# Patient Record
Sex: Male | Born: 1945 | Race: White | Hispanic: No | Marital: Married | Smoking: Former smoker
Health system: Southern US, Community
[De-identification: ages and names within clinical notes are randomized; demographics above are authoritative.]

## PROBLEM LIST (undated history)

## (undated) ENCOUNTER — Emergency Department (HOSPITAL_COMMUNITY): Admission: EM | Discharge status: Against Medical Advice | Payer: Self-pay

## (undated) DIAGNOSIS — H269 Unspecified cataract: Secondary | ICD-10-CM

## (undated) DIAGNOSIS — I639 Cerebral infarction, unspecified: Secondary | ICD-10-CM

## (undated) DIAGNOSIS — D649 Anemia, unspecified: Secondary | ICD-10-CM

## (undated) DIAGNOSIS — N419 Inflammatory disease of prostate, unspecified: Secondary | ICD-10-CM

## (undated) DIAGNOSIS — E119 Type 2 diabetes mellitus without complications: Secondary | ICD-10-CM

## (undated) DIAGNOSIS — M1990 Unspecified osteoarthritis, unspecified site: Secondary | ICD-10-CM

## (undated) DIAGNOSIS — F419 Anxiety disorder, unspecified: Secondary | ICD-10-CM

## (undated) DIAGNOSIS — I499 Cardiac arrhythmia, unspecified: Secondary | ICD-10-CM

## (undated) DIAGNOSIS — Z87442 Personal history of urinary calculi: Secondary | ICD-10-CM

## (undated) DIAGNOSIS — F32A Depression, unspecified: Secondary | ICD-10-CM

## (undated) DIAGNOSIS — N400 Enlarged prostate without lower urinary tract symptoms: Secondary | ICD-10-CM

## (undated) DIAGNOSIS — K219 Gastro-esophageal reflux disease without esophagitis: Secondary | ICD-10-CM

## (undated) DIAGNOSIS — I509 Heart failure, unspecified: Secondary | ICD-10-CM

## (undated) DIAGNOSIS — J449 Chronic obstructive pulmonary disease, unspecified: Secondary | ICD-10-CM

## (undated) DIAGNOSIS — Z9861 Coronary angioplasty status: Secondary | ICD-10-CM

## (undated) DIAGNOSIS — G4730 Sleep apnea, unspecified: Secondary | ICD-10-CM

## (undated) DIAGNOSIS — I739 Peripheral vascular disease, unspecified: Secondary | ICD-10-CM

## (undated) DIAGNOSIS — I2510 Atherosclerotic heart disease of native coronary artery without angina pectoris: Secondary | ICD-10-CM

## (undated) DIAGNOSIS — G4761 Periodic limb movement disorder: Secondary | ICD-10-CM

## (undated) DIAGNOSIS — I779 Disorder of arteries and arterioles, unspecified: Secondary | ICD-10-CM

## (undated) DIAGNOSIS — I10 Essential (primary) hypertension: Secondary | ICD-10-CM

## (undated) DIAGNOSIS — E039 Hypothyroidism, unspecified: Secondary | ICD-10-CM

## (undated) DIAGNOSIS — I498 Other specified cardiac arrhythmias: Secondary | ICD-10-CM

## (undated) DIAGNOSIS — Z720 Tobacco use: Secondary | ICD-10-CM

## (undated) DIAGNOSIS — R0600 Dyspnea, unspecified: Secondary | ICD-10-CM

## (undated) DIAGNOSIS — E785 Hyperlipidemia, unspecified: Secondary | ICD-10-CM

## (undated) DIAGNOSIS — I69319 Unspecified symptoms and signs involving cognitive functions following cerebral infarction: Secondary | ICD-10-CM

## (undated) DIAGNOSIS — G4733 Obstructive sleep apnea (adult) (pediatric): Secondary | ICD-10-CM

## (undated) DIAGNOSIS — F329 Major depressive disorder, single episode, unspecified: Secondary | ICD-10-CM

## (undated) DIAGNOSIS — Z7902 Long term (current) use of antithrombotics/antiplatelets: Secondary | ICD-10-CM

## (undated) HISTORY — PX: OTHER SURGICAL HISTORY: SHX169

## (undated) HISTORY — PX: TONSILLECTOMY: SUR1361

## (undated) HISTORY — PX: CAROTID PTA/STENT INTERVENTION: CATH118231

## (undated) HISTORY — PX: SHOULDER ARTHROSCOPY: SHX128

## (undated) HISTORY — PX: CORONARY ANGIOPLASTY WITH STENT PLACEMENT: SHX49

---

## 2000-07-31 DIAGNOSIS — I252 Old myocardial infarction: Secondary | ICD-10-CM

## 2000-07-31 HISTORY — PX: CORONARY ANGIOPLASTY WITH STENT PLACEMENT: SHX49

## 2000-07-31 HISTORY — DX: Old myocardial infarction: I25.2

## 2005-07-15 ENCOUNTER — Inpatient Hospital Stay: Payer: Self-pay | Admitting: Orthopedic Surgery

## 2005-07-15 ENCOUNTER — Other Ambulatory Visit: Payer: Self-pay

## 2005-07-16 ENCOUNTER — Other Ambulatory Visit: Payer: Self-pay

## 2005-09-27 ENCOUNTER — Ambulatory Visit: Payer: Self-pay | Admitting: Orthopedic Surgery

## 2005-10-24 ENCOUNTER — Ambulatory Visit: Payer: Self-pay | Admitting: Urology

## 2006-07-19 ENCOUNTER — Ambulatory Visit: Payer: Self-pay | Admitting: Gastroenterology

## 2008-08-21 ENCOUNTER — Ambulatory Visit: Payer: Self-pay | Admitting: Family Medicine

## 2010-12-10 ENCOUNTER — Emergency Department: Payer: Self-pay | Admitting: Emergency Medicine

## 2010-12-16 ENCOUNTER — Emergency Department: Payer: Self-pay | Admitting: Emergency Medicine

## 2011-07-11 ENCOUNTER — Ambulatory Visit: Payer: Self-pay

## 2011-08-01 DIAGNOSIS — I639 Cerebral infarction, unspecified: Secondary | ICD-10-CM

## 2011-08-01 HISTORY — DX: Cerebral infarction, unspecified: I63.9

## 2012-05-11 ENCOUNTER — Encounter (HOSPITAL_COMMUNITY): Payer: Self-pay | Admitting: Anesthesiology

## 2012-05-11 ENCOUNTER — Encounter (HOSPITAL_COMMUNITY): Payer: Self-pay | Admitting: Certified Registered Nurse Anesthetist

## 2012-05-11 ENCOUNTER — Inpatient Hospital Stay (HOSPITAL_COMMUNITY): Payer: Medicare Other | Admitting: Certified Registered Nurse Anesthetist

## 2012-05-11 ENCOUNTER — Encounter (HOSPITAL_COMMUNITY)
Admission: AD | Disposition: A | Discharge status: Skilled Nursing Facility | Payer: Self-pay | Source: Other Acute Inpatient Hospital | Attending: Neurology

## 2012-05-11 ENCOUNTER — Inpatient Hospital Stay (HOSPITAL_COMMUNITY): Payer: Medicare Other

## 2012-05-11 ENCOUNTER — Emergency Department: Payer: Self-pay | Admitting: Emergency Medicine

## 2012-05-11 ENCOUNTER — Inpatient Hospital Stay (HOSPITAL_COMMUNITY)
Admission: AD | Admit: 2012-05-11 | Discharge: 2012-05-17 | DRG: 023 | Disposition: A | Discharge status: Skilled Nursing Facility | Payer: Medicare Other | Source: Other Acute Inpatient Hospital | Attending: Neurology | Admitting: Neurology

## 2012-05-11 DIAGNOSIS — J4489 Other specified chronic obstructive pulmonary disease: Secondary | ICD-10-CM | POA: Diagnosis present

## 2012-05-11 DIAGNOSIS — Z9861 Coronary angioplasty status: Secondary | ICD-10-CM

## 2012-05-11 DIAGNOSIS — J9600 Acute respiratory failure, unspecified whether with hypoxia or hypercapnia: Secondary | ICD-10-CM | POA: Diagnosis present

## 2012-05-11 DIAGNOSIS — R1310 Dysphagia, unspecified: Secondary | ICD-10-CM | POA: Diagnosis present

## 2012-05-11 DIAGNOSIS — Z9282 Status post administration of tPA (rtPA) in a different facility within the last 24 hours prior to admission to current facility: Secondary | ICD-10-CM

## 2012-05-11 DIAGNOSIS — Z880 Allergy status to penicillin: Secondary | ICD-10-CM

## 2012-05-11 DIAGNOSIS — E119 Type 2 diabetes mellitus without complications: Secondary | ICD-10-CM | POA: Diagnosis present

## 2012-05-11 DIAGNOSIS — E039 Hypothyroidism, unspecified: Secondary | ICD-10-CM | POA: Diagnosis present

## 2012-05-11 DIAGNOSIS — I10 Essential (primary) hypertension: Secondary | ICD-10-CM | POA: Diagnosis present

## 2012-05-11 DIAGNOSIS — R4701 Aphasia: Secondary | ICD-10-CM | POA: Diagnosis present

## 2012-05-11 DIAGNOSIS — I2510 Atherosclerotic heart disease of native coronary artery without angina pectoris: Secondary | ICD-10-CM | POA: Diagnosis present

## 2012-05-11 DIAGNOSIS — F3289 Other specified depressive episodes: Secondary | ICD-10-CM | POA: Diagnosis present

## 2012-05-11 DIAGNOSIS — I639 Cerebral infarction, unspecified: Secondary | ICD-10-CM | POA: Diagnosis present

## 2012-05-11 DIAGNOSIS — E785 Hyperlipidemia, unspecified: Secondary | ICD-10-CM | POA: Diagnosis present

## 2012-05-11 DIAGNOSIS — G8190 Hemiplegia, unspecified affecting unspecified side: Secondary | ICD-10-CM | POA: Diagnosis present

## 2012-05-11 DIAGNOSIS — N400 Enlarged prostate without lower urinary tract symptoms: Secondary | ICD-10-CM | POA: Diagnosis present

## 2012-05-11 DIAGNOSIS — I63239 Cerebral infarction due to unspecified occlusion or stenosis of unspecified carotid arteries: Principal | ICD-10-CM | POA: Diagnosis present

## 2012-05-11 DIAGNOSIS — I6529 Occlusion and stenosis of unspecified carotid artery: Secondary | ICD-10-CM | POA: Diagnosis present

## 2012-05-11 DIAGNOSIS — F17200 Nicotine dependence, unspecified, uncomplicated: Secondary | ICD-10-CM | POA: Diagnosis present

## 2012-05-11 DIAGNOSIS — Z23 Encounter for immunization: Secondary | ICD-10-CM

## 2012-05-11 DIAGNOSIS — F329 Major depressive disorder, single episode, unspecified: Secondary | ICD-10-CM | POA: Diagnosis present

## 2012-05-11 DIAGNOSIS — F411 Generalized anxiety disorder: Secondary | ICD-10-CM | POA: Diagnosis present

## 2012-05-11 DIAGNOSIS — J449 Chronic obstructive pulmonary disease, unspecified: Secondary | ICD-10-CM | POA: Diagnosis present

## 2012-05-11 HISTORY — DX: Chronic obstructive pulmonary disease, unspecified: J44.9

## 2012-05-11 HISTORY — DX: Hyperlipidemia, unspecified: E78.5

## 2012-05-11 HISTORY — DX: Depression, unspecified: F32.A

## 2012-05-11 HISTORY — DX: Atherosclerotic heart disease of native coronary artery without angina pectoris: I25.10

## 2012-05-11 HISTORY — DX: Tobacco use: Z72.0

## 2012-05-11 HISTORY — DX: Hypothyroidism, unspecified: E03.9

## 2012-05-11 HISTORY — DX: Essential (primary) hypertension: I10

## 2012-05-11 HISTORY — DX: Major depressive disorder, single episode, unspecified: F32.9

## 2012-05-11 HISTORY — DX: Coronary angioplasty status: Z98.61

## 2012-05-11 HISTORY — DX: Anxiety disorder, unspecified: F41.9

## 2012-05-11 HISTORY — DX: Benign prostatic hyperplasia without lower urinary tract symptoms: N40.0

## 2012-05-11 LAB — COMPREHENSIVE METABOLIC PANEL
Anion Gap: 8 (ref 7–16)
Calcium, Total: 8.5 mg/dL (ref 8.5–10.1)
Co2: 26 mmol/L (ref 21–32)
EGFR (Non-African Amer.): >60
Osmolality: 283 (ref 275–301)
Potassium: 3.9 mmol/L (ref 3.5–5.1)
Sodium: 141 mmol/L (ref 136–145)

## 2012-05-11 LAB — URINALYSIS, COMPLETE
Bacteria: NONE SEEN
Blood: NEGATIVE
Glucose,UR: NEGATIVE mg/dL (ref 0–75)
Leukocyte Esterase: NEGATIVE
Nitrite: NEGATIVE
Ph: 5 (ref 4.5–8.0)
Protein: NEGATIVE
Specific Gravity: 1.004 (ref 1.003–1.030)
WBC UR: <1 /HPF (ref 0–5)

## 2012-05-11 LAB — TROPONIN I: Troponin-I: <0.02 ng/mL

## 2012-05-11 LAB — DRUG SCREEN, URINE
Amphetamines, Ur Screen: NEGATIVE (ref ?–1000)
Cocaine Metabolite,Ur ~~LOC~~: NEGATIVE (ref ?–300)
MDMA (Ecstasy)Ur Screen: NEGATIVE (ref ?–500)
Methadone, Ur Screen: NEGATIVE (ref ?–300)
Opiate, Ur Screen: NEGATIVE (ref ?–300)
Tricyclic, Ur Screen: NEGATIVE (ref ?–1000)

## 2012-05-11 LAB — APTT: Activated PTT: 32.5 secs (ref 23.6–35.9)

## 2012-05-11 LAB — CBC
HCT: 43.5 % (ref 40.0–52.0)
MCH: 34.3 pg — ABNORMAL HIGH (ref 26.0–34.0)
MCV: 99 fL (ref 80–100)

## 2012-05-11 LAB — ETHANOL: Ethanol: 30 mg/dL

## 2012-05-11 LAB — CK TOTAL AND CKMB (NOT AT ARMC): CK, Total: 88 U/L (ref 35–232)

## 2012-05-11 SURGERY — RADIOLOGY WITH ANESTHESIA
Anesthesia: General

## 2012-05-11 MED ORDER — SODIUM CHLORIDE 0.9 % IJ SOLN
1.5000 mg | INTRAVENOUS | Status: AC
  Filled 2012-05-11: qty 0.3

## 2012-05-11 MED ORDER — ALTEPLASE 30 MG/30 ML FOR INTERV. RAD
30.0000 mg | INTRA_ARTERIAL | Status: AC
  Filled 2012-05-11: qty 30

## 2012-05-11 MED ORDER — MIDAZOLAM HCL 2 MG/2ML IJ SOLN
INTRAMUSCULAR | Status: DC | PRN
  Administered 2012-05-11 (×2): 1 mg via INTRAVENOUS

## 2012-05-11 MED ORDER — FENTANYL CITRATE 0.05 MG/ML IJ SOLN
INTRAMUSCULAR | Status: DC | PRN
  Administered 2012-05-11 (×2): 25 ug via INTRAVENOUS

## 2012-05-12 ENCOUNTER — Inpatient Hospital Stay (HOSPITAL_COMMUNITY): Payer: Medicare Other

## 2012-05-12 ENCOUNTER — Encounter (HOSPITAL_COMMUNITY): Payer: Self-pay | Admitting: Certified Registered Nurse Anesthetist

## 2012-05-12 ENCOUNTER — Encounter (HOSPITAL_COMMUNITY): Payer: Self-pay | Admitting: Neurology

## 2012-05-12 DIAGNOSIS — I639 Cerebral infarction, unspecified: Secondary | ICD-10-CM | POA: Diagnosis present

## 2012-05-12 DIAGNOSIS — J449 Chronic obstructive pulmonary disease, unspecified: Secondary | ICD-10-CM

## 2012-05-12 DIAGNOSIS — J95821 Acute postprocedural respiratory failure: Secondary | ICD-10-CM

## 2012-05-12 DIAGNOSIS — I6529 Occlusion and stenosis of unspecified carotid artery: Secondary | ICD-10-CM

## 2012-05-12 DIAGNOSIS — I63239 Cerebral infarction due to unspecified occlusion or stenosis of unspecified carotid arteries: Secondary | ICD-10-CM

## 2012-05-12 DIAGNOSIS — J9600 Acute respiratory failure, unspecified whether with hypoxia or hypercapnia: Secondary | ICD-10-CM | POA: Diagnosis present

## 2012-05-12 DIAGNOSIS — I6330 Cerebral infarction due to thrombosis of unspecified cerebral artery: Secondary | ICD-10-CM

## 2012-05-12 DIAGNOSIS — I10 Essential (primary) hypertension: Secondary | ICD-10-CM | POA: Diagnosis present

## 2012-05-12 LAB — BLOOD GAS, ARTERIAL
Drawn by: 35135
Drawn by: 32470
O2 Saturation: 99.2 %
PEEP: 5 cmH2O
PEEP: 5 cmH2O
Pressure support: 5 cmH2O
RATE: 14 resp/min
pCO2 arterial: 48.4 mmHg — ABNORMAL HIGH (ref 35.0–45.0)
pH, Arterial: 7.342 — ABNORMAL LOW (ref 7.350–7.450)
pO2, Arterial: 144 mmHg — ABNORMAL HIGH (ref 80.0–100.0)

## 2012-05-12 LAB — GLUCOSE, CAPILLARY
Glucose-Capillary: 186 mg/dL — ABNORMAL HIGH (ref 70–99)
Glucose-Capillary: 128 mg/dL — ABNORMAL HIGH (ref 70–99)

## 2012-05-12 LAB — TROPONIN I: Troponin I: <0.3 ng/mL (ref <0.30)

## 2012-05-12 LAB — TSH: TSH: 0.398 u[IU]/mL (ref 0.350–4.500)

## 2012-05-12 MED ORDER — ASPIRIN 300 MG RE SUPP
300.0000 mg | Freq: Every day | RECTAL | Status: DC
  Administered 2012-05-12 – 2012-05-13 (×2): 300 mg via RECTAL
  Filled 2012-05-12 (×3): qty 1

## 2012-05-12 MED ORDER — LIDOCAINE HCL (CARDIAC) 20 MG/ML IV SOLN
INTRAVENOUS | Status: DC | PRN
  Administered 2012-05-11: 100 mg via INTRAVENOUS

## 2012-05-12 MED ORDER — ONDANSETRON HCL 4 MG/2ML IJ SOLN
4.0000 mg | Freq: Four times a day (QID) | INTRAMUSCULAR | Status: DC | PRN
  Administered 2012-05-12: 4 mg via INTRAVENOUS

## 2012-05-12 MED ORDER — HYDROMORPHONE HCL PF 1 MG/ML IJ SOLN: 0.2500 mg | INTRAMUSCULAR | Status: DC | PRN

## 2012-05-12 MED ORDER — ASPIRIN 81 MG PO CHEW: 81.0000 mg | CHEWABLE_TABLET | Freq: Once | ORAL | Status: DC

## 2012-05-12 MED ORDER — PHENYLEPHRINE HCL 10 MG/ML IJ SOLN
10.0000 mg | INTRAVENOUS | Status: DC | PRN
  Administered 2012-05-12: 100 ug/min via INTRAVENOUS

## 2012-05-12 MED ORDER — OXYCODONE HCL 5 MG PO TABS: 5.0000 mg | ORAL_TABLET | Freq: Once | ORAL | Status: AC | PRN

## 2012-05-12 MED ORDER — METOPROLOL TARTRATE 1 MG/ML IV SOLN
INTRAVENOUS | Status: AC
  Administered 2012-05-12: 5 mg
  Filled 2012-05-12: qty 5

## 2012-05-12 MED ORDER — SODIUM CHLORIDE 0.9 % IV SOLN
INTRAVENOUS | Status: DC
  Administered 2012-05-12: 06:00:00 via INTRAVENOUS
  Administered 2012-05-13: 75 mL/h via INTRAVENOUS
  Administered 2012-05-13 – 2012-05-15 (×2): via INTRAVENOUS

## 2012-05-12 MED ORDER — ACETAMINOPHEN 650 MG RE SUPP: 650.0000 mg | RECTAL | Status: DC | PRN

## 2012-05-12 MED ORDER — ROCURONIUM BROMIDE 100 MG/10ML IV SOLN
INTRAVENOUS | Status: DC | PRN
  Administered 2012-05-12 (×2): 30 mg via INTRAVENOUS
  Administered 2012-05-12 (×2): 20 mg via INTRAVENOUS
  Administered 2012-05-12: 50 mg via INTRAVENOUS
  Administered 2012-05-12: 20 mg via INTRAVENOUS

## 2012-05-12 MED ORDER — SUCCINYLCHOLINE CHLORIDE 20 MG/ML IJ SOLN
INTRAMUSCULAR | Status: DC | PRN
  Administered 2012-05-11: 120 mg via INTRAVENOUS

## 2012-05-12 MED ORDER — CLOPIDOGREL BISULFATE 75 MG PO TABS
75.0000 mg | ORAL_TABLET | Freq: Once | ORAL | Status: DC
  Filled 2012-05-12: qty 1

## 2012-05-12 MED ORDER — IPRATROPIUM-ALBUTEROL 18-103 MCG/ACT IN AERO
4.0000 | INHALATION_SPRAY | RESPIRATORY_TRACT | Status: DC
  Administered 2012-05-12 (×4): 4 via RESPIRATORY_TRACT
  Filled 2012-05-12: qty 14.7

## 2012-05-12 MED ORDER — ACETAMINOPHEN 500 MG PO TABS: 1000.0000 mg | ORAL_TABLET | Freq: Four times a day (QID) | ORAL | Status: DC | PRN

## 2012-05-12 MED ORDER — ARTIFICIAL TEARS OP OINT
TOPICAL_OINTMENT | OPHTHALMIC | Status: DC | PRN
  Administered 2012-05-11: 1 via OPHTHALMIC

## 2012-05-12 MED ORDER — ALBUTEROL SULFATE HFA 108 (90 BASE) MCG/ACT IN AERS
INHALATION_SPRAY | RESPIRATORY_TRACT | Status: DC | PRN
  Administered 2012-05-11 – 2012-05-12 (×3): 2 via RESPIRATORY_TRACT

## 2012-05-12 MED ORDER — NICARDIPINE HCL IN NACL 20-0.86 MG/200ML-% IV SOLN
5.0000 mg/h | INTRAVENOUS | Status: AC
  Filled 2012-05-12: qty 200

## 2012-05-12 MED ORDER — IPRATROPIUM-ALBUTEROL 18-103 MCG/ACT IN AERO
6.0000 | INHALATION_SPRAY | RESPIRATORY_TRACT | Status: DC
  Filled 2012-05-12: qty 14.7

## 2012-05-12 MED ORDER — MEPERIDINE HCL 25 MG/ML IJ SOLN: 6.2500 mg | INTRAMUSCULAR | Status: DC | PRN

## 2012-05-12 MED ORDER — LEVOTHYROXINE SODIUM 150 MCG PO TABS
150.0000 ug | ORAL_TABLET | Freq: Every day | ORAL | Status: DC
  Administered 2012-05-12 – 2012-05-14 (×2): 150 ug via ORAL
  Filled 2012-05-12 (×5): qty 1

## 2012-05-12 MED ORDER — PROPOFOL INFUSION 10 MG/ML OPTIME
INTRAVENOUS | Status: DC | PRN
  Administered 2012-05-12: 75 ug/kg/min via INTRAVENOUS

## 2012-05-12 MED ORDER — BIOTENE DRY MOUTH MT LIQD
15.0000 mL | Freq: Four times a day (QID) | OROMUCOSAL | Status: DC
  Administered 2012-05-12 – 2012-05-17 (×20): 15 mL via OROMUCOSAL

## 2012-05-12 MED ORDER — ALBUTEROL SULFATE (5 MG/ML) 0.5% IN NEBU
2.5000 mg | INHALATION_SOLUTION | RESPIRATORY_TRACT | Status: DC
  Administered 2012-05-13 – 2012-05-16 (×13): 2.5 mg via RESPIRATORY_TRACT
  Filled 2012-05-12 (×14): qty 0.5

## 2012-05-12 MED ORDER — FENTANYL CITRATE 0.05 MG/ML IJ SOLN
INTRAMUSCULAR | Status: DC | PRN
  Administered 2012-05-11: 150 ug via INTRAVENOUS
  Administered 2012-05-11 – 2012-05-12 (×2): 100 ug via INTRAVENOUS
  Administered 2012-05-12: 50 ug via INTRAVENOUS
  Administered 2012-05-12: 100 ug via INTRAVENOUS
  Administered 2012-05-12 (×2): 50 ug via INTRAVENOUS

## 2012-05-12 MED ORDER — ALBUTEROL SULFATE HFA 108 (90 BASE) MCG/ACT IN AERS
4.0000 | INHALATION_SPRAY | RESPIRATORY_TRACT | Status: DC | PRN
  Filled 2012-05-12: qty 6.7

## 2012-05-12 MED ORDER — LIDOCAINE HCL 4 % MT SOLN
OROMUCOSAL | Status: DC | PRN
  Administered 2012-05-11: 4 mL via TOPICAL

## 2012-05-12 MED ORDER — PANTOPRAZOLE SODIUM 40 MG IV SOLR
40.0000 mg | Freq: Every day | INTRAVENOUS | Status: DC
  Filled 2012-05-12: qty 40

## 2012-05-12 MED ORDER — ONDANSETRON HCL 4 MG/2ML IJ SOLN: 4.0000 mg | Freq: Once | INTRAMUSCULAR | Status: AC | PRN

## 2012-05-12 MED ORDER — PROPOFOL 10 MG/ML IV EMUL
5.0000 ug/kg/min | INTRAVENOUS | Status: DC
  Administered 2012-05-12: 5 ug/kg/min via INTRAVENOUS
  Filled 2012-05-12: qty 100

## 2012-05-12 MED ORDER — IPRATROPIUM-ALBUTEROL 18-103 MCG/ACT IN AERO: 6.0000 | INHALATION_SPRAY | RESPIRATORY_TRACT | Status: DC

## 2012-05-12 MED ORDER — PANTOPRAZOLE SODIUM 40 MG IV SOLR
40.0000 mg | Freq: Every day | INTRAVENOUS | Status: DC
  Administered 2012-05-12 – 2012-05-14 (×3): 40 mg via INTRAVENOUS
  Filled 2012-05-12 (×7): qty 40

## 2012-05-12 MED ORDER — SODIUM CHLORIDE 0.9 % IV SOLN: 10.0000 mg | INTRAVENOUS | Status: DC | PRN

## 2012-05-12 MED ORDER — ACETAMINOPHEN 650 MG RE SUPP: 650.0000 mg | Freq: Four times a day (QID) | RECTAL | Status: DC | PRN

## 2012-05-12 MED ORDER — PROPOFOL 10 MG/ML IV BOLUS
INTRAVENOUS | Status: DC | PRN
  Administered 2012-05-11 (×2): 120 mg via INTRAVENOUS
  Administered 2012-05-11: 80 mg via INTRAVENOUS

## 2012-05-12 MED ORDER — ONDANSETRON HCL 4 MG/2ML IJ SOLN
4.0000 mg | Freq: Four times a day (QID) | INTRAMUSCULAR | Status: DC | PRN
  Filled 2012-05-12: qty 2

## 2012-05-12 MED ORDER — LABETALOL HCL 5 MG/ML IV SOLN: 10.0000 mg | INTRAVENOUS | Status: DC | PRN

## 2012-05-12 MED ORDER — EPHEDRINE SULFATE 50 MG/ML IJ SOLN
INTRAMUSCULAR | Status: DC | PRN
  Administered 2012-05-12 (×2): 5 mg via INTRAVENOUS
  Administered 2012-05-12 (×2): 10 mg via INTRAVENOUS

## 2012-05-12 MED ORDER — SODIUM CHLORIDE 0.9 % IV SOLN
INTRAVENOUS | Status: DC | PRN
  Administered 2012-05-11 – 2012-05-12 (×2): via INTRAVENOUS

## 2012-05-12 MED ORDER — OXYCODONE HCL 5 MG/5ML PO SOLN: 5.0000 mg | Freq: Once | ORAL | Status: AC | PRN

## 2012-05-12 MED ORDER — ASPIRIN 300 MG RE SUPP: 300.0000 mg | Freq: Every day | RECTAL | Status: DC

## 2012-05-12 MED ORDER — INSULIN ASPART 100 UNIT/ML ~~LOC~~ SOLN
0.0000 [IU] | SUBCUTANEOUS | Status: DC
  Administered 2012-05-12 – 2012-05-15 (×7): 2 [IU] via SUBCUTANEOUS
  Administered 2012-05-15: 3 [IU] via SUBCUTANEOUS
  Administered 2012-05-16: 2 [IU] via SUBCUTANEOUS
  Administered 2012-05-16: 3 [IU] via SUBCUTANEOUS
  Administered 2012-05-16: 2 [IU] via SUBCUTANEOUS
  Administered 2012-05-16: 3 [IU] via SUBCUTANEOUS
  Administered 2012-05-17 (×3): 2 [IU] via SUBCUTANEOUS

## 2012-05-12 MED ORDER — ACETAMINOPHEN 325 MG PO TABS
650.0000 mg | ORAL_TABLET | ORAL | Status: DC | PRN
  Administered 2012-05-15: 650 mg via ORAL
  Filled 2012-05-12: qty 2

## 2012-05-12 MED ORDER — SODIUM CHLORIDE 0.9 % IV SOLN: INTRAVENOUS | Status: DC

## 2012-05-12 MED ORDER — IPRATROPIUM BROMIDE 0.02 % IN SOLN
0.5000 mg | RESPIRATORY_TRACT | Status: DC
  Administered 2012-05-13 – 2012-05-16 (×14): 0.5 mg via RESPIRATORY_TRACT
  Filled 2012-05-12 (×14): qty 2.5

## 2012-05-12 MED ORDER — METOPROLOL TARTRATE 1 MG/ML IV SOLN
5.0000 mg | Freq: Once | INTRAVENOUS | Status: AC
  Administered 2012-05-12: 5 mg via INTRAVENOUS

## 2012-05-12 MED ORDER — CHLORHEXIDINE GLUCONATE 0.12 % MT SOLN
15.0000 mL | Freq: Two times a day (BID) | OROMUCOSAL | Status: DC
  Administered 2012-05-12 – 2012-05-17 (×11): 15 mL via OROMUCOSAL
  Filled 2012-05-12 (×13): qty 15

## 2012-05-12 MED ORDER — IOHEXOL 300 MG/ML  SOLN
150.0000 mL | Freq: Once | INTRAMUSCULAR | Status: AC | PRN
  Administered 2012-05-12: 100 mL via INTRAVENOUS

## 2012-05-12 MED ORDER — LEVOTHYROXINE SODIUM 100 MCG IV SOLR
150.0000 ug | Freq: Every day | INTRAVENOUS | Status: DC
  Filled 2012-05-12: qty 7.5

## 2012-05-12 NOTE — Consult Note (Signed)
Name: Paul Bass MRN: [id number] DOB: [birth year]    LOS: 1  Referring Provider:  Dr. Roseanne Reno, Neurology Reason for Referral:  Vent management  PULMONARY / CRITICAL CARE MEDICINE  HPI:  66 yo M with CAD, HTN who was witnessed to have acute weakness in his right upper and lower extremities and aphasia.  He was brought to [facility] and CT suggested hyperacute stroke.  He was given TPA.  He was transferred [facility] for further intervention.  He underwent left carotid angiography with revascularization of the left MCA was and stent assisted angioplasty of left proximal ICA. He was intubated prior to the procedure and remains intubated.   Past Medical History  Diagnosis Date  . HTN (hypertension)   . CAD S/P percutaneous coronary angioplasty   . Hyperlipidemia   . COPD (chronic obstructive pulmonary disease)   . Tobacco abuse   . Hypothyroidism   . Depression   . Anxiety   . BPH (benign prostatic hypertrophy)    No past surgical history on file. Prior to Admission medications   Medication Sig Start Date End Date Taking? Authorizing Provider  albuterol (PROVENTIL HFA;VENTOLIN HFA) 108 (90 BASE) MCG/ACT inhaler Inhale 2 puffs into the lungs every 6 (six) hours as needed. For shortness of breath   Yes Historical Provider, MD  aspirin 325 MG tablet Take 325 mg by mouth daily.   Yes Historical Provider, MD  atorvastatin (LIPITOR) 20 MG tablet Take 20 mg by mouth daily.   Yes Historical Provider, MD  budesonide-formoterol (SYMBICORT) 160-4.5 MCG/ACT inhaler Inhale 2 puffs into the lungs 2 (two) times daily.   Yes Historical Provider, MD  cholecalciferol (VITAMIN D) 400 UNITS TABS Take 800 Units by mouth daily.   Yes Historical Provider, MD  citalopram (CELEXA) 20 MG tablet Take 20 mg by mouth daily.   Yes Historical Provider, MD  clonazePAM (KLONOPIN) 0.5 MG tablet Take 0.5 mg by mouth 2 (two) times daily as needed. For anxiety   Yes Historical Provider, MD  levothyroxine (SYNTHROID,  LEVOTHROID) 150 MCG tablet Take 150 mcg by mouth daily.   Yes Historical Provider, MD  losartan (COZAAR) 50 MG tablet Take 50 mg by mouth daily.   Yes Historical Provider, MD  Multiple Vitamin (MULTIVITAMIN WITH MINERALS) TABS Take 1 tablet by mouth daily.   Yes Historical Provider, MD  omega-3 acid ethyl esters (LOVAZA) 1 G capsule Take 4.8 g by mouth daily.   Yes Historical Provider, MD  omeprazole (PRILOSEC) 20 MG capsule Take 20 mg by mouth daily.   Yes Historical Provider, MD  Tamsulosin HCl (FLOMAX) 0.4 MG CAPS Take 0.4 mg by mouth daily.   Yes Historical Provider, MD   Allergies Allergies  Allergen Reactions  . Penicillins Other (See Comments)    unknown    Family History No family history on file. Social History  does not have a smoking history on file. He does not have any smokeless tobacco history on file. His alcohol and drug histories not on file.  Review Of Systems:  Unable to obtain 2/2 patient sedation and intubated.  Brief patient description:  66 yo M with CAD, HTN, and COPD with acute stroke s/p systemic TPA and left carotid stent and MCA revascularization.  Events Since Admission: 10/13 Left carotid angiography with left carotid stent and L MCA revascularization  Current Status:  Vital Signs: Pulse Rate:  [61-81] 61  (10/13 0400) Resp:  [11-22] 11  (10/13 0400) BP: (130-185)/(55-91) 130/64 mmHg (10/13 0400) SpO2:  [95 %-  100 %] 100 % (10/13 0400) Arterial Line BP: (140-179)/(60-75) 140/60 mmHg (10/13 0400) FiO2 (%):  [50 %-100 %] 50 % (10/13 0403)  Physical Examination: General:  Intubated Neuro:  Sedated HEENT:  ETT in place, sclera clear, MMM Neck:  No adenopathy Cardiovascular:  RRR Lungs:  Diffuse bilateral wheezes Abdomen:  Soft, NT, ND Musculoskeletal:  Joints wnl Skin:  No rash  Active Problems:  Stroke, acute, thrombotic  Symptomatic carotid artery stenosis  HTN (hypertension)  COPD (chronic obstructive pulmonary disease)   ASSESSMENT  AND PLAN  PULMONARY No results found for this basename: PHART:5,PCO2:5,PCO2ART:5,PO2ART:5,HCO3:5,O2SAT:5 in the last 168 hours Ventilator Settings: Vent Mode:  [-] PRVC FiO2 (%):  [50 %-100 %] 50 % Set Rate:  [14 bmp] 14 bmp Vt Set:  [400 mL] 400 mL PEEP:  [5 cmH20] 5 cmH20 Plateau Pressure:  [23 cmH20] 23 cmH20 CXR:  pending ETT:  10/13 >>>  A:  COPD, currently with significant wheezing, intubated prior to VIR procedure P:   - Standing duonebs, assess response and further treatment if needed - Ordered CXR and ABG - Assess for extudation in AM  CARDIOVASCULAR No results found for this basename: TROPONINI:5,LATICACIDVEN:5, O2SATVEN:5,PROBNP:5 in the last 168 hours Lines: PIV  A: Hypertension P:  Hold home antihypertensives  RENAL No results found for this basename: NA:5,K:2,CL:5,CO2:5,BUN:5,CREATININE:5,CALCIUM:5,MG:5,PHOS:5 in the last 168 hours Intake/Output      10/12 0701 - 10/13 0700   I.V. 1500   Total Intake 1500   Urine 600   Total Output 600   Net +900        Foley: 10/13  A:  No issues P:   Check electrolytes    NEUROLOGIC  A:  MCA stroke s/p TPA and left carotid stent and MCA revascularization. P:   Post stroke manement and work-up per neuro   BEST PRACTICE / DISPOSITION Level of Care:  ICU Primary Service: Neuro Consultants:  PCCM Code Status:  Full Diet:  Holding TF DVT Px:  SCDs GI Px:  protonix Skin Integrity:  No issues Social / Family:  Wife and daughter updated  CC time 45 min  Chanel Mcadams, M.D. Pulmonary and Critical Care Medicine [facility] Pager: [phone]  05/12/2012, 4:50 AM

## 2012-05-12 NOTE — Progress Notes (Signed)
Subjective: Pt sedated on vent. Events noted  Objective: Physical Exam: BP 125/74  Pulse 58  Temp 97.4 F (36.3 C)  Resp 21  Wt 246 lb 4.1 oz (111.7 kg)  SpO2 98% Sedated. (B)groin sheaths intact, no bleeding Legs warm, distal pulses present.   Studies/Results: Ct Head Wo Contrast  05/12/2012  *RADIOLOGY REPORT*  Clinical Data: Stroke, post IR procedure  CT HEAD WITHOUT CONTRAST  Technique:  Contiguous axial images were obtained from the base of the skull through the vertex without contrast.  Comparison: None.  Findings: No evidence of parenchymal hemorrhage or extra-axial fluid collection. No mass lesion, mass effect, or midline shift.  No CT evidence of acute infarction.  Intracranial atherosclerosis.  Cerebral volume is age appropriate.  No ventriculomegaly.  The visualized paranasal sinuses are essentially clear. The mastoid air cells are unopacified.  No evidence of calvarial fracture.  IMPRESSION: No evidence of acute intracranial abnormality.   Original Report Authenticated By: Charline Bills, M.D.    Portable Chest Xray  05/12/2012  *RADIOLOGY REPORT*  Clinical Data: Acute stroke.  Acute respiratory failure. Endotracheal tube placement.  PORTABLE CHEST - 1 VIEW  Comparison: None.  Findings: Endotracheal tube is seen with tip in mid thoracic trachea.  Nasogastric tube is seen entering the stomach.  Both lungs are clear.  The heart size is within normal limits allowing for portable technique.  IMPRESSION: Endotracheal tube and nasogastric tube in appropriate position.  No active lung disease.   Original Report Authenticated By: Danae Orleans, M.D.     Assessment/Plan: Acute thrombotic CVA Carotid stenosis S/p (L)MCA revascularization and clot retrieval and (L)ICA stent angioplasty Sheaths remain Follow up CT/MR today Will report to Dr. Corliss Skains    LOS: 1 day    Brayton El PA-C 05/12/2012 8:46 AM

## 2012-05-12 NOTE — Anesthesia Postprocedure Evaluation (Signed)
  Anesthesia Post-op Note  Patient: Paul Bass  Procedure(s) Performed: * No procedures listed *  Patient Location: NICU  Anesthesia Type: General  Level of Consciousness: Patient remains intubated per anesthesia plan  Airway and Oxygen Therapy: Patient remains intubated per anesthesia plan and Patient placed on Ventilator (see vital sign flow sheet for setting)  Post-op Pain: none  Post-op Assessment: Post-op Vital signs reviewed  Post-op Vital Signs: Reviewed and stable  Complications: No apparent anesthesia complications

## 2012-05-12 NOTE — Plan of Care (Signed)
Problem: Consults Goal: Diabetes Guidelines if Diabetic/Glucose > 140 If diabetic or lab glucose is > 140 mg/dl - Initiate Diabetes/Hyperglycemia Guidelines & Document Interventions  Outcome: Progressing Order obtained forn sliding scale, CBG >180.  Problem: tPA Day Progression Outcomes-Only if tPA administered Goal: Post tPA VS, neuro checks Outcome: Progressing Still monitoring every hour, modified NIH

## 2012-05-12 NOTE — Procedures (Signed)
S/P Lt common carotid arteriogram followed by endovascular complete revascularization of oclluded Lt MCA with x1 pass of TREVO PRO vue  Stent retriever device ,and stent assisted angioplasty of symptomatic  pre occlusive Lt ICA  Stenosis .

## 2012-05-12 NOTE — Progress Notes (Signed)
Stroke Team Progress Note  HISTORY JARAMIAH BOSSARD is an 66 y.o. male history of hypertension, hyperlipidemia, coronary artery disease and COPD, who developed acute onset of receptive and expressive aphasia as well as right hemiplegia at about 6:10 PM on 05/11/2012. There is no previous history of stroke nor TIA. Patient has been taking aspirin daily. He presented initially to the emergency room at [facility]. CT scan of his head showed hyperdensity of the left middle cerebral artery, indicative of probable thrombus. NIH stroke score was 17. Laboratory studies were unremarkable. He was given intravenous thrombolytic therapy with TPA. There was a return of movement of his right upper and lower extremities. Profoundly aphasia was persistent, however. Patient was subsequently transferred to [facility] for further management, including interventional radiology evaluation and treatment for probable left MCA occlusion. Patient was taken to the angiogram suite on arrival. Carotid angiogram showed occlusion left middle cerebral artery proximally, as well as moderately severe stenosis of proximal left ICA. Revascularization of the left MCA was achieved using Trevo stent retreiver x 1 attempt, and  Angioplasty and stenting of left proximal ICA was performed. Patient was subsequently transferred to neuro intensive care unit for further management.     He  SUBJECTIVE This am he has remained stable  Blood pressure has been adequately controlled. He has been following few commands and moving left extremities more than right. He has just returned from a CT scan of the head which shows early ischemia in the left insular cortex and frontal operculum without any hemorrhage  OBJECTIVE Most recent Vital Signs: Filed Vitals:   05/12/12 0800 05/12/12 0806 05/12/12 0900 05/12/12 1000  BP: 119/63  142/61 131/68  Pulse: 56 58 65   Temp:      Resp: 15 21 19    Weight:      SpO2: 95% 98% 100%     CBG (last 3)   Basename 05/12/12 0833 05/12/12 0638  GLUCAP 145* 186*    IV Fluid Intake:     . sodium chloride 75 mL/hr at 05/12/12 0700  . sodium chloride    . niCARDipine    . propofol 25 mcg/kg/min (05/12/12 0700)    MEDICATIONS    . albuterol-ipratropium  4 puff Inhalation Q4H  . alteplase  30 mg Intra-arterial to XRAY  . antiseptic oral rinse  15 mL Mouth Rinse QID  . chlorhexidine  15 mL Mouth Rinse BID  . insulin aspart  0-15 Units Subcutaneous Q4H  . levothyroxine  150 mcg Oral QAC breakfast  . metoprolol      . metoprolol  5 mg Intravenous Once  . nitroGLYCERIN 1.5mg /50ml(25 mcg/ml) - [facility]-IR  1.5 mg Intra-arterial to XRAY  . pantoprazole (PROTONIX) IV  40 mg Intravenous QHS  . DISCONTD: albuterol-ipratropium  6 puff Inhalation Q4H  . DISCONTD: albuterol-ipratropium  6 puff Inhalation Q4H  . DISCONTD: levothyroxine  150 mcg Intravenous Daily  . DISCONTD: pantoprazole (PROTONIX) IV  40 mg Intravenous Daily   PRN:  acetaminophen, acetaminophen, acetaminophen, acetaminophen, albuterol, fentaNYL, HYDROmorphone (DILAUDID) injection, iohexol, labetalol, meperidine (DEMEROL) injection, midazolam, ondansetron (ZOFRAN) IV, ondansetron (ZOFRAN) IV, ondansetron (ZOFRAN) IV, oxyCODONE, oxyCODONE  Diet:  NPO  Activity:  Bedrest  DVT Prophylaxis:  SCDs  CLINICALLY SIGNIFICANT STUDIES Basic Metabolic Panel: No results found for this basename: NA:2,K:2,CL:2,CO2:2,GLUCOSE:2,BUN:2,CREATININE:2,CALCIUM:2,MG:2,PHOS:2 in the last 168 hours Liver Function Tests: No results found for this basename: AST:2,ALT:2,ALKPHOS:2,BILITOT:2,PROT:2,ALBUMIN:2 in the last 168 hours CBC: No results found for this basename: WBC:2,NEUTROABS:2,HGB:2,HCT:2,MCV:2,PLT:2 in the last  168 hours Coagulation: No results found for this basename: LABPROT:4,INR:4 in the last 168 hours Cardiac Enzymes:  Lab 05/12/12 0820  CKTOTAL --  CKMB --  CKMBINDEX --  TROPONINI <0.30   Urinalysis: No results found for  this basename: COLORURINE:2,APPERANCEUR:2,LABSPEC:2,PHURINE:2,GLUCOSEU:2,HGBUR:2,BILIRUBINUR:2,KETONESUR:2,PROTEINUR:2,UROBILINOGEN:2,NITRITE:2,LEUKOCYTESUR:2 in the last 168 hours Lipid Panel No results found for this basename: chol, trig, hdl, cholhdl, vldl, ldlcalc   HgbA1C  No results found for this basename: HGBA1C    Urine Drug Screen:   No results found for this basename: labopia, cocainscrnur, labbenz, amphetmu, thcu, labbarb    Alcohol Level: No results found for this basename: ETH:2 in the last 168 hours  Ct Head Wo Contrast  05/12/2012  *RADIOLOGY REPORT*  Clinical Data: Status post revascularization of the left middle cerebral artery.  CT HEAD WITHOUT CONTRAST  Technique:  Contiguous axial images were obtained from the base of the skull through the vertex without contrast.  Comparison: CT head without contrast 05/12/2012 at 03:04 a.m.  Findings: Study is mildly degraded by patient motion. An evolving left MCA territory infarct involves the left insular cortex and frontal operculum.  The more anterior and posterior MCA territories are preserved.  There is no hemorrhage.  There is some focal mass effect with effacement of the sulci.  The ventricles are of normal size.  No significant extra-axial fluid collection is present.  The patient is intubated.  The paranasal sinuses and mastoid air cells are clear.  Atherosclerotic calcifications are noted within the cavernous carotid arteries, more prominently on the right.  The osseous skull is otherwise intact.  IMPRESSION:  1.  Evolving left MCA territory infarct involving the left insular cortex and frontal operculum without evidence for hemorrhagic conversion. 2.  The study is mildly degraded by patient motion.   Original Report Authenticated By: Jamesetta Orleans. MATTERN, M.D.    Ct Head Wo Contrast  05/12/2012  *RADIOLOGY REPORT*  Clinical Data: Stroke, post IR procedure  CT HEAD WITHOUT CONTRAST  Technique:  Contiguous axial images were  obtained from the base of the skull through the vertex without contrast.  Comparison: None.  Findings: No evidence of parenchymal hemorrhage or extra-axial fluid collection. No mass lesion, mass effect, or midline shift.  No CT evidence of acute infarction.  Intracranial atherosclerosis.  Cerebral volume is age appropriate.  No ventriculomegaly.  The visualized paranasal sinuses are essentially clear. The mastoid air cells are unopacified.  No evidence of calvarial fracture.  IMPRESSION: No evidence of acute intracranial abnormality.   Original Report Authenticated By: Charline Bills, M.D.    Portable Chest Xray  05/12/2012  *RADIOLOGY REPORT*  Clinical Data: Acute stroke.  Acute respiratory failure. Endotracheal tube placement.  PORTABLE CHEST - 1 VIEW  Comparison: None.  Findings: Endotracheal tube is seen with tip in mid thoracic trachea.  Nasogastric tube is seen entering the stomach.  Both lungs are clear.  The heart size is within normal limits allowing for portable technique.  IMPRESSION: Endotracheal tube and nasogastric tube in appropriate position.  No active lung disease.   Original Report Authenticated By: Danae Orleans, M.D.       MRI of the brain  pending  MRA of the brain pending  2D Echocardiogram done Carotid Doppler  pending  CXR pending  EKG  Supraventricular tachycardia T wave abnormality, consider inferior ischemia Therapy Recommendations pending  Physical Exam  Middle aged Caucasian male intubated.Awake alert. Afebrile. Head is nontraumatic. Neck is supple without bruit. Hearing is normal. Cardiac exam no murmur or gallop. Lungs  are clear to auscultation. Distal pulses are well felt. Bilateral groin arterial sheaths present Neurological Exam : Stuporous. Opens eyes partially to stimulation. Mild left gaze deviation but can look to the right. Pupils 3 mm sluggishly reactive. Corneal reflexes are present. Dolls eye moments are sluggish. Tongue deviated to the right. Mild  right lower facial weakness. Able to move left upper and lower extremity against gravity spontaneously. Moves right upper and lower extremity to painful stimuli but has moderate weakness. Sensation, coordination and gait cannot be tested. Right plantar is equivocal left is downgoing.  ASSESSMENT Mr. ANQUAN AZZARELLO is a 66 y.o. 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 Treated with.   IV t-PA  And rescue PTA/stenting of proximal LICA stenosis and mechanical embolectomy using Trevo stent rettreiver with complete recanalization of Lt MCA. On aspirin prior to admission. Now on Aspirin   for secondary stroke prevention. Patient with resultant  aphasia and rt hemiparesis      Hospital day # 1  TREATMENT/PLAN   Continue aggressive blood pressure control and ventilatory support for now. Check MRI scan later today. Discontinue bilateral groin sheaths. Repeat CT head in a.m. and consider extubation if tolerated. Start aspirin rectally and may need to add Plavix given carotid stent. Check echocardiogram, lipid profile hemoglobin A1c. Physical ,occupational, speech therapy and rehabilitation consults. No family available at the bedside.  Discuss with critical care M.D. and Dr. Corliss Skains This patient is critically ill and at significant risk of neurological worsening, death and care requires constant monitoring of vital signs, hemodynamics,respiratory and cardiac monitoring,review of multiple databases, neurological assessment, discussion with family, other specialists and medical decision making of high complexity. I spent 30 minutes of neurocritical care time  in the care of  this patient.    Delia Heady, MD Medical Director [facility] Pager: [phone] 05/12/2012 11:26 AM

## 2012-05-12 NOTE — Progress Notes (Signed)
PHARMACIST - PHYSICIAN COMMUNICATION DR:    CONCERNING:  IV levothyroxine   RECOMMENDATION: Levothyroxine IV ordered for pt as daily; pt's home PO dose is ; IV conversion is typically 50% of the PO dose.  No current thyroid labs available.  Please consider changing IV dose to if appropriate.  Thank you.   Vernard Gambles, PharmD, BCPS 05/12/2012 5:03 AM

## 2012-05-12 NOTE — Progress Notes (Signed)
05/12/12    pt extubated to 2L [place] per MD orders. Pt is stable RT will continue to monitor.

## 2012-05-12 NOTE — Transfer of Care (Signed)
Immediate Anesthesia Transfer of Care Note  Patient: Paul Bass  Procedure(s) Performed: * No procedures listed *  Patient Location: NICU  Anesthesia Type: General  Level of Consciousness: unresponsive and Patient remains intubated per anesthesia plan  Airway & Oxygen Therapy: Patient remains intubated per anesthesia plan and Patient placed on Ventilator (see vital sign flow sheet for setting)  Post-op Assessment: Report given to PACU RN and Post -op Vital signs reviewed and stable  Post vital signs: Reviewed and stable  Complications: No apparent anesthesia complications

## 2012-05-12 NOTE — Progress Notes (Signed)
eLink Physician-Brief Progress Note Patient Name: Paul Bass DOB: 04-30-1946 MRN: [id number]  Date of Service  05/12/2012   HPI/Events of Note   Awake, following commands, excellent Tv on Ps5/5 , improved ABg  eICU Interventions  Extubate to [place]   Intervention Category Major Interventions: Respiratory failure - evaluation and management  ALVA,RAKESH V. 05/12/2012, 5:46 PM

## 2012-05-12 NOTE — Progress Notes (Signed)
A 9 French sheath  removed from the right femoral artery at 1150am. A vpad applied to right sheath site and pulses palpated prior to manual pressure application. Hemostasis achieved at sterile dressing applied at 1245pm. Distal pulses pressent on right side post hemostasis. A 4 French sheath removed from the left femoral artery at 1220am. A vpad applied to left sheath site and pulses palpated prior to manual pressure application. Hemostasis achieved at sterile dressing applied at 1245pm. Distal pulses pressent on left side post hemostasis.

## 2012-05-12 NOTE — Progress Notes (Signed)
Name: Paul Bass MRN: [id number] DOB: 1945/11/29    LOS: 1  Referring Provider:  Dr. Roseanne Reno, Neurology Reason for Referral:  Vent management  PULMONARY / CRITICAL CARE MEDICINE  HPI:  66 yo M with CAD, HTN who was witnessed to have acute weakness in his right upper and lower extremities and aphasia.  He was brought to [facility] and CT suggested hyperacute stroke.  He was given TPA.  He was transferred [facility] for further intervention.  He underwent left carotid angiography with revascularization of the left MCA was and stent assisted angioplasty of left proximal ICA. He was intubated prior to the procedure and remains intubated.  Brief patient description:  66 yo M with CAD, HTN, and COPD with acute stroke s/p systemic TPA and left carotid stent and MCA revascularization.  Events Since Admission: 10/13 Left carotid angiography with left carotid stent and L MCA revascularization  Current Status: Sedated, intubated  Vital Signs: Temp:  [97.4 F (36.3 C)] 97.4 F (36.3 C) (10/13 0400) Pulse Rate:  [61-142] 63  (10/13 0732) Resp:  [11-23] 21  (10/13 0732) BP: (125-185)/(55-100) 125/74 mmHg (10/13 0732) SpO2:  [92 %-100 %] 100 % (10/13 0736) Arterial Line BP: (127-179)/(60-89) 127/73 mmHg (10/13 0602) FiO2 (%):  [50 %-100 %] 50 % (10/13 0736) Weight:  [111.7 kg (246 lb 4.1 oz)] 111.7 kg (246 lb 4.1 oz) (10/13 0403)  Intake/Output Summary (Last 24 hours) at 05/12/12 0803 Last data filed at 05/12/12 4098  Gross per 24 hour  Intake   1650 ml  Output    800 ml  Net    850 ml    Physical Examination: General:  Intubated Neuro:  Sedated HEENT:  ETT in place, sclera clear, MMM Neck:  No adenopathy Cardiovascular:  RRR Lungs:  Diffuse bilateral wheezes Abdomen:  Soft, NT, ND Musculoskeletal:  Joints wnl Skin:  No rash  Active Problems:  Stroke, acute, thrombotic  Symptomatic carotid artery stenosis  HTN (hypertension)  COPD (chronic obstructive pulmonary  disease)   ASSESSMENT AND PLAN  PULMONARY  Lab 05/12/12 0445  PHART 7.243*  PCO2ART 55.0*  PO2ART 144.0*  HCO3 22.9  O2SAT 99.2   Ventilator Settings: Vent Mode:  [-] PRVC FiO2 (%):  [50 %-100 %] 50 % Set Rate:  [14 bmp-24 bmp] 24 bmp Vt Set:  [400 mL-450 mL] 450 mL PEEP:  [5 cmH20] 5 cmH20 Plateau Pressure:  [23 cmH20-26 cmH20] 26 cmH20 CXR:  No infiltrates 10/13 ETT:  10/13 >>>  A:  COPD, significant wheezing 10/13 am, intubated prior to VIR procedure P:   - Standing duonebs, assess response and further treatment if needed - WUA and SBT now, goal extubation 10/13  CARDIOVASCULAR No results found for this basename: TROPONINI:5,LATICACIDVEN:5, O2SATVEN:5,PROBNP:5 in the last 168 hours Lines: PIV  A: Hypertension P:  Hold home antihypertensives  RENAL No results found for this basename: NA:5,K:2,CL:5,CO2:5,BUN:5,CREATININE:5,CALCIUM:5,MG:5,PHOS:5 in the last 168 hours Intake/Output      10/12 0701 - 10/13 0700 10/13 0701 - 10/14 0700   I.V. (mL/kg) 1650 (14.8)    Total Intake(mL/kg) 1650 (14.8)    Urine (mL/kg/hr) 800 (0.3)    Total Output 800    Net +850          Foley: 10/13  A:  No issues P:   Check electrolytes    NEUROLOGIC  A:  MCA stroke s/p TPA and left carotid stent and MCA revascularization. P:   Post stroke manement and work-up per neuro. If there are plans  to go back to IR for further clot removal then we will defer extubation.    BEST PRACTICE / DISPOSITION Level of Care:  ICU Primary Service: Neuro Consultants:  PCCM, IR Code Status:  Full Diet:  Holding TF DVT Px:  SCDs GI Px:  protonix Skin Integrity:  No issues Social / Family:  Wife and daughter updated by Dr Britta Mccreedy  CC time 45 min  Leslye Peer., M.D. Pulmonary and Critical Care Medicine [facility] Pager: [phone]  05/12/2012, 8:02 AM

## 2012-05-12 NOTE — Progress Notes (Signed)
  Echocardiogram 2D Echocardiogram has been performed.  Paul Bass 05/12/2012, 8:52 AM

## 2012-05-12 NOTE — Progress Notes (Signed)
SLP Cancellation Note  Patient Details Name: TYMEL CONELY MRN: [id number] DOB: 1946-06-26   Cancelled treatment:    ST received order for SLE/BSE and unable to complete as patient intubated. ST to follow up for POC on 05/13/12.      Moreen Fowler MS, CCC-SLP [phone] [facility] 05/12/2012, 1:10 PM

## 2012-05-12 NOTE — H&P (Addendum)
Admission H&P    Chief Complaint: Acute onset of aphasia and right hemiplegia.  HPI: Paul Bass is an 66 y.o. male history of hypertension, hyperlipidemia, coronary artery disease and COPD, who developed acute onset of receptive and expressive aphasia as well as right hemiplegia at about 6:10 PM on 05/11/2012. There is no previous history of stroke nor TIA. Patient has been taking aspirin daily. He presented initially to the emergency room at [facility]. CT scan of his head showed hyperdensity of the left middle cerebral artery, indicative of probable thrombus. NIH stroke score was 17. Laboratory studies were unremarkable. He was given intravenous thrombolytic therapy with TPA. There was a return of movement of his right upper and lower extremities. Profoundly aphasia was persistent, however. Patient was subsequently transferred to [facility] for further management, including interventional radiology evaluation and treatment for probable left MCA occlusion. Patient was taken to the angiogram suite on arrival. Carotid angiogram showed occlusion right middle cerebral artery proximally, as well as moderately severe stenosis of proximal left ICA. Revascularization of the left MCA was achieved, and stent assisted angioplasty of left proximal ICA was performed. Patient was subsequently transferred to neuro intensive care unit for further management.  LSN: 6:10 PM, 05/11/2012 tPA Given: Yes mRankin:  Past Medical History  Diagnosis Date  . HTN (hypertension)   . CAD S/P percutaneous coronary angioplasty   . Hyperlipidemia   . COPD (chronic obstructive pulmonary disease)   . Tobacco abuse   . Hypothyroidism   . Depression   . Anxiety   . BPH (benign prostatic hypertrophy)     No past surgical history on file.  No family history on file.  Social History: Patient smokes 2 packs of cigarettes per day and drinks moderately heavily on weekends.  Allergies:    Allergies  Allergen Reactions  . Penicillins Other (See Comments)    unknown    Medications Prior to Admission  Medication Sig Dispense Refill  . albuterol (PROVENTIL HFA;VENTOLIN HFA) 108 (90 BASE) MCG/ACT inhaler Inhale 2 puffs into the lungs every 6 (six) hours as needed. For shortness of breath      . aspirin 325 MG tablet Take 325 mg by mouth daily.      Marland Kitchen atorvastatin (LIPITOR) 20 MG tablet Take 20 mg by mouth daily.      . budesonide-formoterol (SYMBICORT) 160-4.5 MCG/ACT inhaler Inhale 2 puffs into the lungs 2 (two) times daily.      . cholecalciferol (VITAMIN D) 400 UNITS TABS Take 800 Units by mouth daily.      . citalopram (CELEXA) 20 MG tablet Take 20 mg by mouth daily.      . clonazePAM (KLONOPIN) 0.5 MG tablet Take 0.5 mg by mouth 2 (two) times daily as needed. For anxiety      . levothyroxine (SYNTHROID, LEVOTHROID) 150 MCG tablet Take 150 mcg by mouth daily.      Marland Kitchen losartan (COZAAR) 50 MG tablet Take 50 mg by mouth daily.      . Multiple Vitamin (MULTIVITAMIN WITH MINERALS) TABS Take 1 tablet by mouth daily.      Marland Kitchen omega-3 acid ethyl esters (LOVAZA) 1 G capsule Take 4.8 g by mouth daily.      Marland Kitchen omeprazole (PRILOSEC) 20 MG capsule Take 20 mg by mouth daily.      . Tamsulosin HCl (FLOMAX) 0.4 MG CAPS Take 0.4 mg by mouth daily.        ROS: Review of systems was noncontributory  except for presenting deficits as described above.  Physical Examination: Blood pressure 134/55, pulse 67, resp. rate 14, SpO2 100.00%.  HEENT-  Normocephalic, no lesions, without obvious abnormality.  Normal external eye and conjunctiva.  Normal TM's bilaterally.  Normal auditory canals and external ears. Normal external nose, mucus membranes and septum.  Normal pharynx. Neck supple with no masses, nodes, nodules or enlargement. Cardiovascular - regular rate and rhythm, S1, S2 normal, no murmur, click, rub or gallop Lungs - diffuse expiratory wheezes; no rales; breath sounds were equal  bilaterally. Abdomen - soft, non-tender; bowel sounds normal; no masses,  no organomegaly Extremities - no joint deformities, effusion, or inflammation, no edema and no skin discoloration  Neurologic Examination: Mental Status: Alert with moderately severe receptive aphasia and severe expressive aphasia. Moderate difficulty with following simple commands. Cranial Nerves: II-Visual fields were normal. III/IV/VI-Pupils were equal and reacted. Extraocular movements were full and conjugate.    V/VII-mild right lower facial weakness. VIII-normal. X-moderately dysarthric but content consistent with word salad Motor: Minimal weakness of right upper and lower extremities compared to normal strength on the left Sensory: Unable to adequately assess due to poor ability to communicate due to severe aphasia Deep Tendon Reflexes: 1+ in upper extremities and absent in lower extremities. Plantars: Mute bilaterally Cerebellar: Could not assess, as was unable to follow commands  Ct Head Wo Contrast  05/12/2012  *RADIOLOGY REPORT*  Clinical Data: Stroke, post IR procedure  CT HEAD WITHOUT CONTRAST  Technique:  Contiguous axial images were obtained from the base of the skull through the vertex without contrast.  Comparison: None.  Findings: No evidence of parenchymal hemorrhage or extra-axial fluid collection. No mass lesion, mass effect, or midline shift.  No CT evidence of acute infarction.  Intracranial atherosclerosis.  Cerebral volume is age appropriate.  No ventriculomegaly.  The visualized paranasal sinuses are essentially clear. The mastoid air cells are unopacified.  No evidence of calvarial fracture.  IMPRESSION: No evidence of acute intracranial abnormality.   Original Report Authenticated By: Charline Bills, M.D.     Assessment: 66 y.o. 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 Patient is status post intravenous thrombolytic therapy with TPA, and interventional radiology management, including revascularization left middle cerebral artery, and proximal left internal carotid stent-assisted angioplasty.  Stroke Risk Factors - carotid stenosis, hyperlipidemia, hypertension and smoking  Plan: 1. HgbA1c, fasting lipid panel 2. MRI, MRA  of the brain without contrast 3. PT consult, OT consult, Speech consult 4. Echocardiogram 5. Prophylactic therapy-Antiplatelet med: Plavix 75 mg per day, if CT scan 24 hours post TPA administration shows no signs of intracranial hemorrhage. 6. Risk factor modification 7. Telemetry monitoring  C.R. Roseanne Reno, MD [facility] [phone]  05/12/2012, 3:59 AM  Addendum: Patient's evaluation and management required complex decision-making and treatment intervention as well as time spent in conference with family members. Total time involved was 60 minutes.  CR Margaretha Glassing.D.

## 2012-05-12 NOTE — Anesthesia Procedure Notes (Signed)
Procedure Name: Intubation Date/Time: 05/11/2012 11:57 PM Performed by: Julianne Rice Z Pre-anesthesia Checklist: Patient identified, Patient being monitored, Emergency Drugs available, Timeout performed and Suction available Patient Re-evaluated:Patient Re-evaluated prior to inductionOxygen Delivery Method: Circle system utilized Preoxygenation: Pre-oxygenation with 100% oxygen Intubation Type: Rapid sequence and Cricoid Pressure applied Laryngoscope Size: Mac and 4 Grade View: Grade II Tube type: Oral Number of attempts: 1 Airway Equipment and Method: Stylet and LTA kit utilized Placement Confirmation: ETT inserted through vocal cords under direct vision,  breath sounds checked- equal and bilateral and positive ETCO2 Secured at: 23 cm Tube secured with: Tape Dental Injury: Teeth and Oropharynx as per pre-operative assessment  Comments: Gross wheezing post intubation; LTA via ETT; Albuterol puffs X 6 per Dr. Michelle Piper

## 2012-05-12 NOTE — Anesthesia Preprocedure Evaluation (Signed)
Anesthesia Evaluation  Patient identified by MRN, date of birth, ID band Patient unresponsive    Reviewed: Allergy & Precautions, H&P , Patient's Chart, lab work & pertinent test results  Airway Mallampati: I TM Distance: >3 FB Neck ROM: Full    Dental   Pulmonary Current Smoker,          Cardiovascular hypertension, + CAD and + Cardiac Stents     Neuro/Psych    GI/Hepatic   Endo/Other    Renal/GU      Musculoskeletal   Abdominal   Peds  Hematology   Anesthesia Other Findings   Reproductive/Obstetrics                           Anesthesia Physical Anesthesia Plan  ASA: IV  Anesthesia Plan: General   Post-op Pain Management:    Induction: Intravenous, Rapid sequence and Cricoid pressure planned  Airway Management Planned: Oral ETT  Additional Equipment: Arterial line  Intra-op Plan:   Post-operative Plan: Post-operative intubation/ventilation  Informed Consent: I have reviewed the patients History and Physical, chart, labs and discussed the procedure including the risks, benefits and alternatives for the proposed anesthesia with the patient or authorized representative who has indicated his/her understanding and acceptance.   History available from chart only  Plan Discussed with: CRNA and Surgeon  Anesthesia Plan Comments:         Anesthesia Quick Evaluation

## 2012-05-13 ENCOUNTER — Inpatient Hospital Stay (HOSPITAL_COMMUNITY): Payer: Medicare Other

## 2012-05-13 ENCOUNTER — Encounter (HOSPITAL_COMMUNITY): Payer: Self-pay | Admitting: *Deleted

## 2012-05-13 DIAGNOSIS — I6330 Cerebral infarction due to thrombosis of unspecified cerebral artery: Secondary | ICD-10-CM

## 2012-05-13 LAB — CBC WITH DIFFERENTIAL/PLATELET
Eosinophils Absolute: 0.1 10*3/uL (ref 0.0–0.7)
Eosinophils Relative: 1 % (ref 0–5)
MCH: 33.6 pg (ref 26.0–34.0)
Monocytes Absolute: 0.8 10*3/uL (ref 0.1–1.0)
Neutrophils Relative %: 82 % — ABNORMAL HIGH (ref 43–77)
Platelets: 117 10*3/uL — ABNORMAL LOW (ref 150–400)
RBC: 3.93 MIL/uL — ABNORMAL LOW (ref 4.22–5.81)

## 2012-05-13 LAB — GLUCOSE, CAPILLARY: Glucose-Capillary: 108 mg/dL — ABNORMAL HIGH (ref 70–99)

## 2012-05-13 LAB — BASIC METABOLIC PANEL
GFR calc Af Amer: >90 mL/min (ref >90)
GFR calc non Af Amer: 90 mL/min — ABNORMAL LOW (ref >90)
Glucose, Bld: 119 mg/dL — ABNORMAL HIGH (ref 70–99)
Potassium: 3.9 mEq/L (ref 3.5–5.1)
Sodium: 141 mEq/L (ref 135–145)

## 2012-05-13 LAB — LIPID PANEL
HDL: 40 mg/dL (ref >39)
LDL Cholesterol: 64 mg/dL (ref 0–99)
Total CHOL/HDL Ratio: 3 RATIO
VLDL: 15 mg/dL (ref 0–40)

## 2012-05-13 LAB — HEMOGLOBIN A1C: Mean Plasma Glucose: 134 mg/dL — ABNORMAL HIGH (ref <117)

## 2012-05-13 MED ORDER — ENOXAPARIN SODIUM 40 MG/0.4ML ~~LOC~~ SOLN
40.0000 mg | SUBCUTANEOUS | Status: DC
  Administered 2012-05-13 – 2012-05-17 (×5): 40 mg via SUBCUTANEOUS
  Filled 2012-05-13 (×5): qty 0.4

## 2012-05-13 MED ORDER — NICOTINE 21 MG/24HR TD PT24
21.0000 mg | MEDICATED_PATCH | Freq: Every day | TRANSDERMAL | Status: DC
  Administered 2012-05-13 – 2012-05-17 (×5): 21 mg via TRANSDERMAL
  Filled 2012-05-13 (×5): qty 1

## 2012-05-13 MED FILL — Hydralazine HCl Inj 20 MG/ML: INTRAMUSCULAR | Qty: 1 | Status: AC

## 2012-05-13 MED FILL — Eptifibatide IV Soln 2 MG/ML: INTRAVENOUS | Qty: 10 | Status: AC

## 2012-05-13 MED FILL — Clopidogrel Bisulfate Tab 75 MG (Base Equiv): ORAL | Qty: 4 | Status: AC

## 2012-05-13 MED FILL — Clopidogrel Bisulfate Tab 75 MG (Base Equiv): ORAL | Qty: 400 | Status: AC

## 2012-05-13 NOTE — Progress Notes (Signed)
Patient will benefit from an inpt rehab admission prior to d/c home. I have begun pre certification with [facility]. I will follow up in the morning. Please call for any questions. [phone].

## 2012-05-13 NOTE — Procedures (Signed)
Objective Swallowing Evaluation: Fiberoptic Endoscopic Evaluation of Swallowing  Patient Details  Name: Paul Bass MRN: [id number] Date of Birth: 11-08-1945  Today's Date: 05/13/2012 Time: 7829-5621 SLP Time Calculation (min): 37 min  Past Medical History:  Past Medical History  Diagnosis Date  . HTN (hypertension)   . CAD S/P percutaneous coronary angioplasty   . Hyperlipidemia   . COPD (chronic obstructive pulmonary disease)   . Tobacco abuse   . Hypothyroidism   . Depression   . Anxiety   . BPH (benign prostatic hypertrophy)    Past Surgical History:  Past Surgical History  Procedure Date  . 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 Rotator cuff replaced     right  . Pins r lower leg    HPI:  [name] is a 66 y.o. 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 Treated with.   IV t-PA  And rescue PTA/stenting of proximal LICA stenosis and mechanical embolectomy using Trevo stent rettreiver with complete recanalization of Lt MCA. On aspirin prior to admission. Now on Aspirin   for secondary stroke prevention. Patient with resultant  aphasia and rt hemiparesis. Pt intubated from 10/12 to 10/13. Presents with overt signs of aspiration at bedside. FEES warranted to objectively evaluate swallow function.      Assessment / Plan / Recommendation Clinical Impression  Dysphagia Diagnosis: Severe pharyngeal phase dysphagia Clinical impression: Pt presents with a moderate pharyngeal dysphagia with oral and oropharyngeal weakness resulting in initially minimal and then increasing residuals with silent aspiration of thick liquids and pureed solids. Pt has weak base of tongue strength with decreased propulsion of bolus and piecemeal swallowing. Sensory deficits result in a delay in swallow initiation. Laryngeal elevation, epiglottic deflection and  pharyngeal contraction are compromised leading to decreased airway protection and building pharyngeal residuals with evenutal silent aspriation through interarytenoid space, becoming significant after multiple trials. Possible esophageal component given immediate residuals above CP segment. Pt is difficutl to cue for compensatory strategies due to global aphasia. Laryngeal tissues appear red and edematous likely due to intubation as well as GERD (wife reported). Standing secretions in laryngeal vestibule with inefficient expecotration confirm that pt would struggle to expel epentrate/aspirate with PO intake. At this time the pt is not safe for PO intake. Would recommend temporary alternate nutrition with PO trials to indicate pt readiness for regular intake.  Suggest MBS with functional improvement to observe esopahgeal function.     Treatment Recommendation       Diet Recommendation Alternative means - temporary;NPO        Other  Recommendations Oral Care Recommendations: Oral care QID   Follow Up Recommendations  Inpatient Rehab    Frequency and Duration min 2x/week      Pertinent Vitals/Pain NA    SLP Swallow Goals Goal #3: Pt will consume trials of pureed solids or nectar thick liquids with multiple effortful swallow with max contextual cues.  Goal #4: Pt will consume trials of nectar thick liquids with reduced signs of aspiration with min contextual cues prior to repeat MBS.    General HPI: [name] is a 67 y.o. 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 Treated with.   IV t-PA  And rescue PTA/stenting of proximal LICA stenosis and mechanical embolectomy using Trevo  stent rettreiver with complete recanalization of Lt MCA. On aspirin prior to admission. Now on Aspirin   for secondary stroke prevention. Patient with resultant  aphasia and rt hemiparesis. Pt intubated from 10/12 to  10/13. Presents with overt signs of aspiration at bedside. FEES warranted to objectively evaluate swallow function.  Type of Study: Fiberoptic Endoscopic Evaluation of Swallowing Reason for Referral: Objectively evaluate swallowing function Diet Prior to this Study: NPO Temperature Spikes Noted: No Respiratory Status: Supplemental O2 delivered via (comment) History of Recent Intubation: Yes Length of Intubations (days): 2 days Date extubated: 05/12/12 Behavior/Cognition: Confused;Requires cueing;Doesn't follow directions;Alert Oral Cavity - Dentition: Adequate natural dentition Oral Motor / Sensory Function: Impaired - see Bedside swallow eval Self-Feeding Abilities: Needs assist Patient Positioning: Upright in bed Baseline Vocal Quality: Wet Volitional Cough: Cognitively unable to elicit Volitional Swallow: Unable to elicit Anatomy: Within functional limits Pharyngeal Secretions: Standing secretions in (comment)    Reason for Referral Objectively evaluate swallowing function   Oral Phase Oral Preparation/Oral Phase Oral Phase: Impaired Oral - Nectar Oral - Nectar Teaspoon: Weak lingual manipulation;Reduced posterior propulsion;Lingual/palatal residue;Piecemeal swallowing;Delayed oral transit Oral - Nectar Cup: Weak lingual manipulation;Reduced posterior propulsion;Lingual/palatal residue;Piecemeal swallowing;Delayed oral transit Oral - Solids Oral - Puree: Weak lingual manipulation;Reduced posterior propulsion;Lingual/palatal residue;Piecemeal swallowing;Delayed oral transit   Pharyngeal Phase Pharyngeal Phase Pharyngeal Phase: Impaired Pharyngeal - Nectar Pharyngeal - Nectar Teaspoon: Delayed swallow initiation;Premature spillage to valleculae;Reduced pharyngeal peristalsis;Reduced epiglottic inversion;Reduced anterior laryngeal mobility;Reduced laryngeal elevation;Reduced airway/laryngeal closure;Penetration/Aspiration after swallow;Penetration/Aspiration before swallow;Trace  aspiration;Pharyngeal residue - valleculae;Pharyngeal residue - pyriform sinuses;Inter-arytenoid space residue;Lateral channel residue Penetration/Aspiration details (nectar teaspoon): Material enters airway, passes BELOW cords without attempt by patient to eject out (silent aspiration);Material enters airway, passes BELOW cords then ejected out;Material does not enter airway Pharyngeal - Nectar Cup: Delayed swallow initiation;Premature spillage to valleculae;Reduced pharyngeal peristalsis;Reduced epiglottic inversion;Reduced anterior laryngeal mobility;Reduced laryngeal elevation;Reduced airway/laryngeal closure;Penetration/Aspiration after swallow;Penetration/Aspiration before swallow;Trace aspiration;Pharyngeal residue - valleculae;Pharyngeal residue - pyriform sinuses;Inter-arytenoid space residue;Lateral channel residue Penetration/Aspiration details (nectar cup): Material enters airway, passes BELOW cords without attempt by patient to eject out (silent aspiration);Material does not enter airway Pharyngeal - Solids Pharyngeal - Puree: Penetration/Aspiration before swallow;Penetration/Aspiration during swallow;Delayed swallow initiation;Premature spillage to valleculae;Reduced pharyngeal peristalsis;Reduced epiglottic inversion;Reduced anterior laryngeal mobility;Reduced laryngeal elevation;Reduced airway/laryngeal closure;Reduced tongue base retraction;Pharyngeal residue - valleculae;Pharyngeal residue - pyriform sinuses;Inter-arytenoid space residue;Lateral channel residue Penetration/Aspiration details (puree): Material enters airway, remains ABOVE vocal cords and not ejected out  Cervical Esophageal Phase    GO             Harlon Ditty, MA CCC-SLP [phone]  Claudine Mouton 05/13/2012, 3:29 PM

## 2012-05-13 NOTE — Consult Note (Signed)
Physical Medicine and Rehabilitation Consult Reason for Consult: CVA Referring Physician: Dr. Pearlean Brownie   HPI: Paul Bass is a 66 y.o. right-handed male with history of hypertension, coronary artery disease with angioplasty, COPD with tobacco abuse. Patient initially presented to [facility] 05/12/2012 with right-sided weakness and aphasia. Patient had been taking aspirin prior to admission. Cranial CT scan revealed evolving left MCA territory infarct involving the left insular cortex and frontal operculum . Patient did receive TPA. Carotid angiogram showed occlusion right middle cerebral artery proximal as well as moderately severe stenosis of the proximal left ICA. Revascularization of the left MCA was achieved per interventional radiology and stent assisted angioplasty of left proximal ICA was performed. MRI of the range is currently pending. Patient did remain intubated for a short time. Followup carotid Doppler showed 60-79% right ICA stenosis and left carotid stent appeared patent. Echocardiogram with ejection fraction of 55% and grade 1 diastolic dysfunction. Neurology services consulted presently on aspirin, Plavix and subcutaneous Lovenox for DVT prophylaxis. A NicoDerm patch was placed for history of tobacco abuse. Patient is presently n.p.o. and await plans for nutritional support. Physical and occupational therapy evaluations completed 05/13/2012 with recommendations for physical medicine rehabilitation consult to consider inpatient rehabilitation services.   Review of Systems  Unable to perform ROS  Past Medical History  Diagnosis Date  . HTN (hypertension)   . CAD S/P percutaneous coronary angioplasty   . Hyperlipidemia   . COPD (chronic obstructive pulmonary disease)   . Tobacco abuse   . Hypothyroidism   . Depression   . Anxiety   . BPH (benign prostatic hypertrophy)    Past Surgical History  Procedure Date  . 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 Rotator cuff replaced     right  . Pins r lower leg    History reviewed. No pertinent family history. Social History:  reports that he has been smoking Cigarettes.  He has been smoking about 2 packs per day. He does not have any smokeless tobacco history on file. He reports that he drinks about 3 ounces of alcohol per week. His drug history not on file. Allergies:  Allergies  Allergen Reactions  . Penicillins Other (See Comments)    unknown   Medications Prior to Admission  Medication Sig Dispense Refill  . albuterol (PROVENTIL HFA;VENTOLIN HFA) 108 (90 BASE) MCG/ACT inhaler Inhale 2 puffs into the lungs every 6 (six) hours as needed. For shortness of breath      . aspirin 325 MG tablet Take 325 mg by mouth daily.      Marland Kitchen atorvastatin (LIPITOR) 20 MG tablet Take 20 mg by mouth daily.      . budesonide-formoterol (SYMBICORT) 160-4.5 MCG/ACT inhaler Inhale 2 puffs into the lungs 2 (two) times daily.      . cholecalciferol (VITAMIN D) 400 UNITS TABS Take 800 Units by mouth daily.      . citalopram (CELEXA) 20 MG tablet Take 20 mg by mouth daily.      . clonazePAM (KLONOPIN) 0.5 MG tablet Take 0.5 mg by mouth 2 (two) times daily as needed. For anxiety      . levothyroxine (SYNTHROID, LEVOTHROID) 150 MCG tablet Take 150 mcg by mouth daily.      Marland Kitchen losartan (COZAAR) 50 MG tablet Take 50 mg by mouth daily.      . Multiple Vitamin (MULTIVITAMIN WITH MINERALS) TABS Take 1 tablet by mouth daily.      Marland Kitchen  omega-3 acid ethyl esters (LOVAZA) 1 G capsule Take 4.8 g by mouth daily.      Marland Kitchen omeprazole (PRILOSEC) 20 MG capsule Take 20 mg by mouth daily.      . Tamsulosin HCl (FLOMAX) 0.4 MG CAPS Take 0.4 mg by mouth daily.        Home: Home Living Lives With: Spouse Available Help at Discharge: Other (Comment) Type of Home: House Home Access: Level entry Home Layout: One level Bathroom Shower/Tub: Tub/shower unit;Curtain Firefighter: Standard Home Adaptive  Equipment: Bedside commode/3-in-1;Walker - rolling  Functional History: Prior Function Able to Take Stairs?: Yes Driving: Yes Vocation: Full time employment Functional Status:  Mobility: Bed Mobility Bed Mobility: Supine to Sit Supine to Sit: 4: Min assist Transfers Transfers: Sit to Stand;Stand to Sit Sit to Stand: 4: Min guard;From bed Stand to Sit: 4: Min assist;To chair/3-in-1 Ambulation/Gait Ambulation/Gait Assistance: 4: Min assist Ambulation Distance (Feet): 120 Feet Assistive device: 1 person hand held assist Ambulation/Gait Assistance Details: mildly uncoordinated at R ankly during swing phase.  guarded with little dissociation of trunk Gait Pattern: Step-through pattern Stairs: No    ADL:    Cognition: Cognition Overall Cognitive Status: Impaired Arousal/Alertness: Awake/alert Orientation Level: Other (comment) (unable to express orientation due to language deficits. ) Attention: Focused;Sustained Focused Attention: Appears intact Sustained Attention: Impaired Sustained Attention Impairment: Verbal basic;Functional basic Memory:  (UTA) Awareness:  (UTA) Problem Solving: Impaired Problem Solving Impairment: Functional basic;Verbal basic Executive Function: Reasoning Reasoning: Impaired Reasoning Impairment: Functional basic Comments: Perseveration, no functional use of objects - spoon Cognition Overall Cognitive Status: Impaired Area of Impairment: Following commands Arousal/Alertness: Awake/alert Orientation Level:  (unable to determine due to aphasia) Behavior During Session: [facility] for tasks performed Following Commands: Follows one step commands inconsistently  Blood pressure 135/63, pulse 51, temperature 99.4 F (37.4 C), temperature source Oral, resp. rate 21, height 6' (1.829 m), weight 111.7 kg (246 lb 4.1 oz), SpO2 92.00%. Physical Exam  Constitutional: He appears well-developed.  HENT:  Head: Normocephalic.  Eyes:       Pupils round and  reactive to light  Neck: Neck supple. No thyromegaly present.  Cardiovascular: Normal rate and regular rhythm.   Pulmonary/Chest: No respiratory distress. He has no wheezes.  Abdominal: Bowel sounds are normal. He exhibits no distension. There is no tenderness.  Neurological: He is alert.       Patient makes good eye contact with examiner. Patient is aphasic and was limited to following commands. He is expressively and receptively aphasic. He was occasionally able to grunt. He accurately followed a command 10% of the time. He moves all fours and has reasonable sitting balance. He was a little impulsive but for the most part his behavior was appropriate.   Skin: Skin is warm and dry.    Results for orders placed during the hospital encounter of 05/11/12 (from the past 24 hour(s))  GLUCOSE, CAPILLARY     Status: Normal   Collection Time   05/12/12  1:04 PM      Component Value Range   Glucose-Capillary 93  70 - 99 mg/dL  TROPONIN I     Status: Normal   Collection Time   05/12/12  2:00 PM      Component Value Range   Troponin I <0.30  <0.30 ng/mL  GLUCOSE, CAPILLARY     Status: Abnormal   Collection Time   05/12/12  3:53 PM      Component Value Range   Glucose-Capillary 128 (*) 70 - 99  mg/dL  BLOOD GAS, ARTERIAL     Status: Abnormal   Collection Time   05/12/12  6:00 PM      Component Value Range   FIO2 0.40     O2 Content 40.0     Delivery systems VENTILATOR     Mode PRESSURE SUPPORT     Peep/cpap 5.0     Pressure support 5.0     pH, Arterial 7.342 (*) 7.350 - 7.450   pCO2 arterial 48.4 (*) 35.0 - 45.0 mmHg   pO2, Arterial 88.9  80.0 - 100.0 mmHg   Bicarbonate 25.5 (*) 20.0 - 24.0 mEq/L   TCO2 27.0  0 - 100 mmol/L   Acid-Base Excess 0.5  0.0 - 2.0 mmol/L   O2 Saturation 98.0     Patient temperature 98.6     Collection site LEFT RADIAL     Drawn by [id number]     Sample type ARTERIAL     Allens test (pass/fail) PASS  PASS  GLUCOSE, CAPILLARY     Status: Normal   Collection  Time   05/12/12  7:57 PM      Component Value Range   Glucose-Capillary 94  70 - 99 mg/dL  GLUCOSE, CAPILLARY     Status: Abnormal   Collection Time   05/12/12 11:10 PM      Component Value Range   Glucose-Capillary 106 (*) 70 - 99 mg/dL  CBC WITH DIFFERENTIAL     Status: Abnormal   Collection Time   05/13/12 12:00 AM      Component Value Range   WBC 9.2  4.0 - 10.5 K/uL   RBC 3.93 (*) 4.22 - 5.81 MIL/uL   Hemoglobin 13.2  13.0 - 17.0 g/dL   HCT 60.4 (*) 54.0 - 98.1 %   MCV 97.7  78.0 - 100.0 fL   MCH 33.6  26.0 - 34.0 pg   MCHC 34.4  30.0 - 36.0 g/dL   RDW 19.1  47.8 - 29.5 %   Platelets 117 (*) 150 - 400 K/uL   Neutrophils Relative 82 (*) 43 - 77 %   Lymphocytes Relative 8 (*) 12 - 46 %   Monocytes Relative 9  3 - 12 %   Eosinophils Relative 1  0 - 5 %   Basophils Relative 0  0 - 1 %   Neutro Abs 7.6  1.7 - 7.7 K/uL   Lymphs Abs 0.7  0.7 - 4.0 K/uL   Monocytes Absolute 0.8  0.1 - 1.0 K/uL   Eosinophils Absolute 0.1  0.0 - 0.7 K/uL   Basophils Absolute 0.0  0.0 - 0.1 K/uL  BASIC METABOLIC PANEL     Status: Abnormal   Collection Time   05/13/12 12:00 AM      Component Value Range   Sodium 141  135 - 145 mEq/L   Potassium 3.9  3.5 - 5.1 mEq/L   Chloride 107  96 - 112 mEq/L   CO2 26  19 - 32 mEq/L   Glucose, Bld 119 (*) 70 - 99 mg/dL   BUN 10  6 - 23 mg/dL   Creatinine, Ser 6.21  0.50 - 1.35 mg/dL   Calcium 8.5  8.4 - 30.8 mg/dL   GFR calc non Af Amer 90 (*) >90 mL/min   GFR calc Af Amer >90  >90 mL/min  LIPID PANEL     Status: Normal   Collection Time   05/13/12 12:00 AM      Component Value Range  Cholesterol 119  0 - 200 mg/dL   Triglycerides 76  <409 mg/dL   HDL 40  >81 mg/dL   Total CHOL/HDL Ratio 3.0     VLDL 15  0 - 40 mg/dL   LDL Cholesterol 64  0 - 99 mg/dL  GLUCOSE, CAPILLARY     Status: Abnormal   Collection Time   05/13/12  3:26 AM      Component Value Range   Glucose-Capillary 107 (*) 70 - 99 mg/dL  GLUCOSE, CAPILLARY     Status: Abnormal    Collection Time   05/13/12  7:49 AM      Component Value Range   Glucose-Capillary 109 (*) 70 - 99 mg/dL  PLATELET INHIBITION [id number]     Status: Normal   Collection Time   05/13/12  9:42 AM      Component Value Range   Platelet Function  P2Y12 234  194 - 418 PRU  GLUCOSE, CAPILLARY     Status: Abnormal   Collection Time   05/13/12 11:28 AM      Component Value Range   Glucose-Capillary 119 (*) 70 - 99 mg/dL   Ct Head Wo Contrast  05/12/2012  *RADIOLOGY REPORT*  Clinical Data: Follow up post t-PA, for left middle cerebral artery infarct  CT HEAD WITHOUT CONTRAST  Technique:  Contiguous axial images were obtained from the base of the skull through the vertex without contrast.  Comparison: 05/12/2012 at 9:58 a.m.  Findings: The left middle cerebral artery infarct has evolved, now better defined.  It measures approximately 3.5 cm x 3.3 cm in greatest transverse dimension.  It involves the left insular cortex and left lateral frontal lobe extending to just above the level of the left lateral ventricle.  There is no evidence of a new or additional infarct.  No intracranial hemorrhage.  The ventricles remain normal in size and configuration.  No parenchymal masses and no significant mass effect.  No extra-axial masses or abnormal fluid collections.  IMPRESSION: No intracranial hemorrhage following TPA therapy.  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  No new abnormalities.   Original Report Authenticated By: Domenic Moras, M.D.    Ct Head Wo Contrast  05/12/2012  *RADIOLOGY REPORT*  Clinical Data: Status post revascularization of the left middle cerebral artery.  CT HEAD WITHOUT CONTRAST  Technique:  Contiguous axial images were obtained from the base of the skull through the vertex without contrast.  Comparison: CT head without contrast 05/12/2012 at 03:04 a.m.  Findings: Study is mildly degraded by patient motion. An evolving left MCA territory infarct  involves the left insular cortex and frontal operculum.  The more anterior and posterior MCA territories are preserved.  There is no hemorrhage.  There is some focal mass effect with effacement of the sulci.  The ventricles are of normal size.  No significant extra-axial fluid collection is present.  The patient is intubated.  The paranasal sinuses and mastoid air cells are clear.  Atherosclerotic calcifications are noted within the cavernous carotid arteries, more prominently on the right.  The osseous skull is otherwise intact.  IMPRESSION:  1.  Evolving left MCA territory infarct involving the left insular cortex and frontal operculum without evidence for hemorrhagic conversion. 2.  The study is mildly degraded by patient motion.   Original Report Authenticated By: Jamesetta Orleans. MATTERN, M.D.    Ct Head Wo Contrast  05/12/2012  *RADIOLOGY REPORT*  Clinical Data: Stroke, post IR procedure  CT HEAD WITHOUT CONTRAST  Technique:  Contiguous axial images were obtained from the base of the skull through the vertex without contrast.  Comparison: None.  Findings: No evidence of parenchymal hemorrhage or extra-axial fluid collection. No mass lesion, mass effect, or midline shift.  No CT evidence of acute infarction.  Intracranial atherosclerosis.  Cerebral volume is age appropriate.  No ventriculomegaly.  The visualized paranasal sinuses are essentially clear. The mastoid air cells are unopacified.  No evidence of calvarial fracture.  IMPRESSION: No evidence of acute intracranial abnormality.   Original Report Authenticated By: Charline Bills, M.D.    Portable Chest Xray  05/12/2012  *RADIOLOGY REPORT*  Clinical Data: Acute stroke.  Acute respiratory failure. Endotracheal tube placement.  PORTABLE CHEST - 1 VIEW  Comparison: None.  Findings: Endotracheal tube is seen with tip in mid thoracic trachea.  Nasogastric tube is seen entering the stomach.  Both lungs are clear.  The heart size is within normal limits  allowing for portable technique.  IMPRESSION: Endotracheal tube and nasogastric tube in appropriate position.  No active lung disease.   Original Report Authenticated By: Danae Orleans, M.D.     Assessment/Plan: Diagnosis: left MCA infarct 1. Does the need for close, 24 hr/day medical supervision in concert with the patient's rehab needs make it unreasonable for this patient to be served in a less intensive setting? Yes 2. Co-Morbidities requiring supervision/potential complications: htn, copd 3. Due to bladder management, bowel management, safety, skin/wound care, disease management, medication administration, pain management and patient education, does the patient require 24 hr/day rehab nursing? Yes 4. Does the patient require coordinated care of a physician, rehab nurse, PT (1-2 hrs/day, 5 days/week), OT (1-2 hrs/day, 5 days/week) and SLP (2 hrs/day, 5 days/week) to address physical and functional deficits in the context of the above medical diagnosis(es)? Yes Addressing deficits in the following areas: balance, endurance, locomotion, strength, transferring, bowel/bladder control, bathing, dressing, feeding, grooming, toileting and psychosocial support 5. Can the patient actively participate in an intensive therapy program of at least 3 hrs of therapy per day at least 5 days per week? Yes 6. The potential for patient to make measurable gains while on inpatient rehab is excellent 7. Anticipated functional outcomes upon discharge from inpatient rehab are supervision with PT, supervision with OT, min to max assist with SLP. 8. Estimated rehab length of stay to reach the above functional goals is: 7-10 days 9. Does the patient have adequate social supports to accommodate these discharge functional goals? Yes 10. Anticipated D/C setting: Home 11. Anticipated post D/C treatments: HH therapy 12. Overall Rehab/Functional Prognosis: good  RECOMMENDATIONS: This patient's condition is appropriate for  continued rehabilitative care in the following setting: CIR Patient has agreed to participate in recommended program. Yes Note that insurance prior authorization may be required for reimbursement for recommended care.  Comment: Spouse is able to provide assistance at discharge. Given his substantial swallowing and language issues and their impact on his functional activities, I feel that CIR would be highly beneficial for this patient and his spouse in preparing for him to return home.  Ivory Broad, MD    05/13/2012

## 2012-05-13 NOTE — Progress Notes (Signed)
Stroke Team Progress Note  HISTORY Paul Bass is an 66 y.o. male history of hypertension, hyperlipidemia, coronary artery disease and COPD, who developed acute onset of receptive and expressive aphasia as well as right hemiplegia at about 6:10 PM on 05/11/2012. There is no previous history of stroke nor TIA. Patient has been taking aspirin daily. He presented initially to the emergency room at [facility]. CT scan of his head showed hyperdensity of the left middle cerebral artery, indicative of probable thrombus. NIH stroke score was 17. Laboratory studies were unremarkable. He was given intravenous thrombolytic therapy with TPA. There was a return of movement of his right upper and lower extremities. Profound aphasia was persistent, however. Patient was subsequently transferred to [facility] for further management, including interventional radiology evaluation and treatment for probable left MCA occlusion. Patient was taken to the angiogram suite on arrival. Carotid angiogram showed occlusion left middle cerebral artery proximally, as well as moderately severe stenosis of proximal left ICA. Revascularization of the left MCA was achieved using Trevo stent retreiver x 1 attempt, and  Angioplasty and stenting of left proximal ICA was performed. Patient was subsequently transferred to neuro intensive care unit for further management.  SUBJECTIVE Patient extubated this am and tolerating well. Sitting up in bed. Remains aphaisic with only mild rt hemipearesis  OBJECTIVE Most recent Vital Signs: Filed Vitals:   05/13/12 0400 05/13/12 0500 05/13/12 0600 05/13/12 0700  BP: 138/54 136/50 129/58 129/49  Pulse: 54 54 58 52  Temp:      TempSrc:      Resp: 23 20 15 21   Height:      Weight:      SpO2: 96% 96% 96% 96%   CBG (last 3)   Basename 05/13/12 0326 05/12/12 2310 05/12/12 1957  GLUCAP 107* 106* 94    IV Fluid Intake:      . sodium chloride 75 mL/hr at 05/13/12  0124  . niCARDipine    . DISCONTD: sodium chloride    . DISCONTD: propofol Stopped (05/12/12 0800)    MEDICATIONS     . albuterol  2.5 mg Nebulization Q4H  . alteplase  30 mg Intra-arterial to XRAY  . antiseptic oral rinse  15 mL Mouth Rinse QID  . aspirin  300 mg Rectal Daily  . chlorhexidine  15 mL Mouth Rinse BID  . clopidogrel  75 mg Oral Once  . insulin aspart  0-15 Units Subcutaneous Q4H  . ipratropium  0.5 mg Nebulization Q4H  . levothyroxine  150 mcg Oral QAC breakfast  . nitroGLYCERIN 1.5mg /63ml(25 mcg/ml) - MC-IR  1.5 mg Intra-arterial to XRAY  . pantoprazole (PROTONIX) IV  40 mg Intravenous QHS  . DISCONTD: albuterol-ipratropium  4 puff Inhalation Q4H  . DISCONTD: aspirin  81 mg Oral Once  . DISCONTD: aspirin  300 mg Rectal Daily  . DISCONTD: clopidogrel  75 mg Oral Once   PRN:  acetaminophen, acetaminophen, acetaminophen, acetaminophen, albuterol, HYDROmorphone (DILAUDID) injection, labetalol, meperidine (DEMEROL) injection, ondansetron (ZOFRAN) IV, ondansetron (ZOFRAN) IV, ondansetron (ZOFRAN) IV, oxyCODONE, oxyCODONE, DISCONTD: fentaNYL, DISCONTD: midazolam  Diet:  NPO  Activity:  Bedrest  DVT Prophylaxis:  SCDs  CLINICALLY SIGNIFICANT STUDIES Basic Metabolic Panel:   Lab 05/13/12  NA 141  K 3.9  CL 107  CO2 26  GLUCOSE 119*  BUN 10  CREATININE 0.83  CALCIUM 8.5  MG --  PHOS --   Liver Function Tests: No results found for this basename: AST:2,ALT:2,ALKPHOS:2,BILITOT:2,PROT:2,ALBUMIN:2 in the last 168 hours  CBC:   Lab 05/13/12  WBC 9.2  NEUTROABS 7.6  HGB 13.2  HCT 38.4*  MCV 97.7  PLT 117*   Cardiac Enzymes:   Lab 05/12/12 1400 05/12/12 0820  CKTOTAL -- --  CKMB -- --  CKMBINDEX -- --  TROPONINI <0.30 <0.30   Lipid Panel     Component Value Date/Time   CHOL 119 05/13/2012 0000   TRIG 76 05/13/2012 0000   HDL 40 05/13/2012 0000   CHOLHDL 3.0 05/13/2012 0000   VLDL 15 05/13/2012 0000   LDLCALC 64 05/13/2012 0000    HgbA1C  No  results found for this basename: HGBA1C   Urine Drug Screen:   No results found for this basename: labopia,  cocainscrnur,  labbenz,  amphetmu,  thcu,  labbarb    Alcohol Level: No results found for this basename: ETH:2 in the last 168 hours  CT Head  05/12/2012   No intracranial hemorrhage following TPA therapy.  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  No new abnormalities.   05/12/2012   1.  Evolving left MCA territory infarct involving the left insular cortex and frontal operculum without evidence for hemorrhagic conversion. 2.  The study is mildly degraded by patient motion.   05/12/2012   No evidence of acute intracranial abnormality.     Cerebral Angio Lt common carotid arteriogram followed by endovascular complete revascularization of oclluded Lt MCA with x1 pass of TREVO PRO vue Stent retriever device ,and stent assisted angioplasty of symptomatic pre occlusive Lt ICA Stenosis .  MRI of the brain    MRA of the brain   2D Echocardiogram   Carotid Doppler   CXR 05/12/2012 Endotracheal tube and nasogastric tube in appropriate position.  No active lung disease.    EKG  Supraventricular tachycardia. T wave abnormality, consider inferior ischemia  Therapy Recommendations   Physical Exam  Middle aged Caucasian male intubated.Awake alert. Afebrile. Head is nontraumatic. Neck is supple without bruit. Hearing is normal. Cardiac exam no murmur or gallop. Lungs are clear to auscultation. Distal pulses are well felt. Bilateral groin arterial sheaths present Neurological Exam : awake alert and significant expressive greater than receptive aphasia. Mumbles and speaks only occasional words.he will follow only few midline commands. Mild left gaze preference but can look to the right. Does not blink to threat on the right but does so on the left.Mild right lower facial weakness. No right upper or lower extremity drift but diminished fine finger  movements on the right. Sensation, coordination and gait cannot be reliably tested. Right plantar is equivocal left is downgoing.  ASSESSMENT [name] is a 66 y.o. 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 Treated with IV t-PA at [facility] and transferred to [facility] where he received rescue PTA/stenting of proximal LICA stenosis and mechanical embolectomy of the left M1 using Trevo stent retriever. Patient with resultant complete recanalization of Lt MCA. On aspirin prior to admission. Now on Aspirin suppository. Need to add plavix given recent stent when able to swallow. Both for secondary stroke prevention. Patient with resultant expressive aphasia and rt hemiparesis.   cigarette smoker, 2 PPD  COPD Hypertension Hyperlipidemia, LDL 64, on statin PTA, goal LDL < 100 Depression HgbA1c pending  Hospital day # 2  TREATMENT/PLAN  F/u MRI, MRA.   OOB. Therapy evals  Continue  aspirin rectally. Add Plavix given carotid stent  as soon as able to swallow. May consider NG if pt unable to swallow.  Check swallow today.  F/u echocardiogram, hemoglobin A1c.  Cancel carotids (see angiogram)   No family available at the bedside. Nicotine patch Transfer to the floor. May go to MRI off tele Add lovenox for VTE prophy   Annie Main, MSN, RN, ANVP-BC, ANP-BC, GNP-BC [facility] Pager: [phone] 05/13/2012 8:23 AM  Scribe for Dr. Delia Heady, Stroke Center Medical Director, who has personally reviewed chart, pertinent data, examined the patient and developed the plan of care. Pager:  [phone]

## 2012-05-13 NOTE — Evaluation (Signed)
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 I with ADL and mobility limited by mild R sided weakness, but primarily by inccoordination and aphasia. Pt also with mild right exophoria- unsure if this is new onset? Pt will benefit from skilled OT in the acute setting followed by CIR to maximize I with ADL and ADL mobility to maximize I with ADL and ADL mobility prior to d/c    OT Assessment  Patient needs continued OT Services    Follow Up Recommendations  Inpatient Rehab;Supervision/Assistance - 24 hour    Barriers to Discharge      Equipment Recommendations  None recommended by OT;None recommended by PT    Recommendations for Other Services Rehab consult  Frequency  Min 3X/week    Precautions / Restrictions Precautions Precautions: Fall Restrictions Weight Bearing Restrictions: No   Pertinent Vitals/Pain Pt with no indication of pain    ADL  Grooming: Performed;Minimal assistance;Wash/dry face Where Assessed - Grooming: Supported standing (pt sways in standing) Lower Body Dressing: Performed;Maximal assistance Where Assessed - Lower Body Dressing: Supported sit to Pharmacist, hospital: Mining engineer Method: Sit to Barista:  (from bed) Transfers/Ambulation Related to ADLs: Pt Min A with HHA ambulation ~17ft. Rt ankle weakness/incoordination.  ADL Comments: Pt with expressive and receptive aphasia limiting eval.     OT Diagnosis: Generalized weakness;Cognitive deficits;Disturbance of vision;Paresis  OT Problem List: Decreased strength;Decreased activity tolerance;Impaired balance (sitting and/or standing);Impaired vision/perception;Decreased cognition;Decreased  coordination;Decreased knowledge of use of DME or AE;Decreased knowledge of precautions;Impaired UE functional use OT Treatment Interventions: Self-care/ADL training;Neuromuscular education;DME and/or AE instruction;Visual/perceptual remediation/compensation;Patient/family education;Balance training;Cognitive remediation/compensation;Therapeutic activities   OT Goals Acute Rehab OT Goals OT Goal Formulation: With patient Time For Goal Achievement: 05/27/12 Potential to Achieve Goals: Good ADL Goals Pt Will Perform Grooming: with supervision;Standing at sink ADL Goal: Grooming - Progress: Goal set today Pt Will Perform Upper Body Dressing: with set-up;Sitting, chair;Sitting, bed ADL Goal: Upper Body Dressing - Progress: Goal set today Pt Will Perform Lower Body Dressing: with set-up;with supervision;Sit to stand from chair;Sit to stand from bed ADL Goal: Lower Body Dressing - Progress: Goal set today Pt Will Transfer to Toilet: with supervision;Ambulation;with DME ADL Goal: Toilet Transfer - Progress: Goal set today Pt Will Perform Toileting - Clothing Manipulation: Independently;Standing ADL Goal: Toileting - Clothing Manipulation - Progress: Goal set today Pt Will Perform Toileting - Hygiene: Independently;Sit to stand from 3-in-1/toilet ADL Goal: Toileting - Hygiene - Progress: Goal set today  Visit Information  Last OT Received On: 05/13/12 Assistance Needed: +2 (for lines/oxygen) PT/OT Co-Evaluation/Treatment: Yes    Subjective Data  Subjective: No vocalization. Pt agreeable to OOB Patient Stated Goal: None stated   Prior Functioning     Home Living Lives With: Spouse Available Help at Discharge: Other (Comment) Type of Home: House Home Access: Level entry Home Layout: One level Bathroom Shower/Tub: Tub/shower unit;Curtain Firefighter: Standard Home Adaptive Equipment: Bedside commode/3-in-1;Walker - rolling Prior Function Level of Independence: Independent Able  to Take Stairs?: Yes Driving: Yes Vocation: Full time employment Communication Communication: Receptive difficulties;Expressive difficulties Dominant Hand: Right         Vision/Perception Vision - Assessment Eye Alignment: Impaired (comment) Additional Comments: Pt with right eye exophoria Perception Perception: Impaired Praxis Praxis: Impaired Praxis Impairment  Details: Ideomotor;Motor planning Praxis-Other Comments: unsure how much is related to vision vs. visual perception   Cognition  Overall Cognitive Status: Impaired Area of Impairment: Following commands Arousal/Alertness: Awake/alert Orientation Level:  (unable to determine due to aphasia) Behavior During Session: [facility] for tasks performed Following Commands: Follows one step commands inconsistently    Extremity/Trunk Assessment Right Upper Extremity Assessment RUE ROM/Strength/Tone: Deficits;Unable to fully assess;Due to impaired cognition RUE ROM/Strength/Tone Deficits: grossly 4+/5; unable to fully test sensation and coordination RUE Coordination: Deficits RUE Coordination Deficits: unable to fully assess Left Upper Extremity Assessment LUE ROM/Strength/Tone: Within functional levels;Unable to fully assess;Due to impaired cognition Right Lower Extremity Assessment RLE ROM/Strength/Tone: [facility] for tasks assessed RLE Coordination: Deficits RLE Coordination Deficits: difficult to assess, but appears unable to fully coordinate movements on R. Left Lower Extremity Assessment LLE ROM/Strength/Tone: WFL for tasks assessed LLE Coordination: WFL - gross motor Trunk Assessment Trunk Assessment: Normal     Mobility Bed Mobility Bed Mobility: Supine to Sit Supine to Sit: 4: Min assist Details for Bed Mobility Assistance: vvc/tc's for direction; min stability assist Transfers Sit to Stand: 4: Min assist;From bed;With upper extremity assist Stand to Sit: With armrests;To chair/3-in-1;4: Min assist Details for Transfer  Assistance: assist for stability and initiation     Shoulder Instructions     Exercise     Balance Balance Balance Assessed: Yes Static Sitting Balance Static Sitting - Balance Support: Feet supported;No upper extremity supported Static Sitting - Level of Assistance: 5: Stand by assistance Static Sitting - Comment/# of Minutes: 6 Static Standing Balance Static Standing - Balance Support: No upper extremity supported;During functional activity Static Standing - Level of Assistance: 5: Stand by assistance   End of Session OT - End of Session Activity Tolerance: Patient tolerated treatment well Patient left: in chair;with call bell/phone within reach Nurse Communication: Mobility status  GO     Estus Krakowski 05/13/2012, 2:20 PM

## 2012-05-13 NOTE — Progress Notes (Signed)
VASCULAR LAB PRELIMINARY  PRELIMINARY  PRELIMINARY  PRELIMINARY  Carotid Dopplers completed.    Preliminary report:  There is 60-79% right ICA stenosis, mid to high range of scale.  The left carotid stent appears patent with velocities elevated just past the stent.  No diastolic flow noted in the distal ICA.  Bilateral vertebral artery flow is antegrade.  Paul Bass, 05/13/2012, 10:06 AM

## 2012-05-13 NOTE — Progress Notes (Signed)
OT Cancellation Note  Patient Details Name: Paul Bass MRN: [id number] DOB: 19-May-1946   Cancelled Treatment:    Reason Eval/Treat Not Completed: Patient not medically ready (bedrest)  Harrel Carina [facility] 05/13/2012, 7:45 AM

## 2012-05-13 NOTE — Progress Notes (Signed)
Rehab Admissions Coordinator Note:  Patient was screened by Clois Dupes for appropriateness for an Inpatient Acute Rehab Consult.  Therapy feels pt is appropriate. Noted wife is taking FMLA.  At this time, we are recommending Inpatient Rehab consult. I contacted Annie Main, [facility] and I will place order.   Clois Dupes 05/13/2012, 11:35 AM  I can be reached at [phone].

## 2012-05-13 NOTE — Evaluation (Signed)
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  Mobility limited by mild R sided weakness, but primarily by  inccoordination.  Aphasia limiting pt's ability to follow diriction.  May need CIR.    PT Assessment  Patient needs continued PT services    Follow Up Recommendations  Post acute inpatient    Does the patient have the potential to tolerate intense rehabilitation   Yes, Recommend IP Rehab Screening  Barriers to Discharge        Equipment Recommendations  None recommended by PT    Recommendations for Other Services Rehab consult   Frequency Min 4X/week    Precautions / Restrictions Precautions Precautions: Fall Restrictions Weight Bearing Restrictions: No   Pertinent Vitals/Pain Sats on 2-3 L [place] maintained from 89-92 %     Mobility  Bed Mobility Bed Mobility: Supine to Sit Supine to Sit: 4: Min assist Details for Bed Mobility Assistance: vvc/tc's for direction; min stability assist Transfers Transfers: Sit to Stand;Stand to Sit Sit to Stand: 4: Min guard;From bed Stand to Sit: 4: Min assist;To chair/3-in-1 Details for Transfer Assistance: stability assist Ambulation/Gait Ambulation/Gait Assistance: 4: Min assist Ambulation Distance (Feet): 120 Feet Assistive device: 1 person hand held assist Ambulation/Gait Assistance Details: mildly uncoordinated at R ankly during swing phase.  guarded with little dissociation of trunk Gait Pattern: Step-through pattern Stairs: No Modified Rankin (Stroke Patients Only) Pre-Morbid Rankin Score: No symptoms Modified Rankin: Moderately severe disability    Shoulder Instructions     Exercises     PT Diagnosis: Other (comment);Abnormality of gait (mild HP R side)  PT Problem  List: Decreased strength;Decreased activity tolerance;Decreased balance;Decreased mobility;Decreased coordination PT Treatment Interventions: Gait training;Functional mobility training;Therapeutic activities;Balance training;Neuromuscular re-education;Patient/family education   PT Goals Acute Rehab PT Goals PT Goal Formulation: Patient unable to participate in goal setting Time For Goal Achievement: 05/27/12 Potential to Achieve Goals: Good Pt will go Supine/Side to Sit: with modified independence;with HOB 0 degrees PT Goal: Supine/Side to Sit - Progress: Goal set today Pt will go Sit to Stand: with supervision PT Goal: Sit to Stand - Progress: Goal set today Pt will Transfer Bed to Chair/Chair to Bed: with supervision PT Transfer Goal: Bed to Chair/Chair to Bed - Progress: Goal set today Pt will Ambulate: >150 feet;with supervision;with least restrictive assistive device PT Goal: Ambulate - Progress: Goal set today  Visit Information  Last PT Received On: 05/13/12 Assistance Needed: +2 (lines) PT/OT Co-Evaluation/Treatment: Yes    Subjective Data  Subjective: unable to verbalize and non verbal unreliable Patient Stated Goal: unable to participate   Prior Functioning  Home Living Lives With: Spouse Available Help at Discharge: Other (Comment) (Wife taking FMLA) Type of Home: House Home Access: Level entry Home Layout: One level Bathroom Shower/Tub: Tub/shower unit;Curtain Firefighter: Standard Home Adaptive Equipment: Bedside commode/3-in-1;Walker - rolling Prior Function Level of Independence: Independent Able to Take Stairs?: Yes Driving: Yes Vocation: Full time employment Communication Communication: Receptive difficulties;Expressive difficulties Dominant Hand: Right    Cognition  Overall Cognitive Status: Appears within functional limits for tasks assessed/performed Arousal/Alertness: Awake/alert Orientation Level: Other (comment) (unable to  determine) Behavior During Session: [facility] for tasks performed    Extremity/Trunk Assessment Right Lower Extremity Assessment RLE ROM/Strength/Tone: [facility] for tasks assessed RLE Coordination: Deficits RLE Coordination Deficits:  difficult to assess, but appears unable to fully coordinate movements on R. Left Lower Extremity Assessment LLE ROM/Strength/Tone: WFL for tasks assessed LLE Coordination: WFL - gross motor Trunk Assessment Trunk Assessment: Normal   Balance Balance Balance Assessed: Yes Static Sitting Balance Static Sitting - Balance Support: Feet supported;No upper extremity supported Static Sitting - Level of Assistance: 5: Stand by assistance Static Sitting - Comment/# of Minutes: 6 Static Standing Balance Static Standing - Balance Support: No upper extremity supported;During functional activity Static Standing - Level of Assistance: 5: Stand by assistance  End of Session PT - End of Session Activity Tolerance: Patient tolerated treatment well Patient left: in chair;with call bell/phone within reach;with family/visitor present Nurse Communication: Mobility status  GP     [name], Eliseo Gum 05/13/2012, 11:28 AM  05/13/2012   Bing, PT [phone] (pager)

## 2012-05-13 NOTE — Evaluation (Signed)
Clinical/Bedside Swallow Evaluation Patient Details  Name: Paul Bass MRN: [id number] Date of Birth: [birth year]  Today's Date: 05/13/2012 Time: 9562-1308 SLP Time Calculation (min): 48 min  Past Medical History:  Past Medical History  Diagnosis Date  . HTN (hypertension)   . CAD S/P percutaneous coronary angioplasty   . Hyperlipidemia   . COPD (chronic obstructive pulmonary disease)   . Tobacco abuse   . Hypothyroidism   . Depression   . Anxiety   . BPH (benign prostatic hypertrophy)    Past Surgical History:  Past Surgical History  Procedure Date  . 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 Rotator cuff replaced     right  . Pins r lower leg    HPI:  Mr. KETIH GOODIE is a 66 y.o. 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 Treated with.   IV t-PA  And rescue PTA/stenting of proximal LICA stenosis and mechanical embolectomy using Trevo stent rettreiver with complete recanalization of Lt MCA. On aspirin prior to admission. Now on Aspirin   for secondary stroke prevention. Patient with resultant  aphasia and rt hemiparesis. Pt intubated from 10/12 to 10/13.    Assessment / Plan / Recommendation Clinical Impression  Suspect a severe dysphagia with overt but delayed signs of residuals and aspiration with a change in vital signs. Pt with poor automatic reponse to PO. Recommend objective test prior to NG placement to assess if pt may tolerate any PO consistency.     Aspiration Risk  Severe    Diet Recommendation NPO        Other  Recommendations Recommended Consults: FEES Oral Care Recommendations: Oral care QID   Follow Up Recommendations       Frequency and Duration        Pertinent Vitals/Pain NA    SLP Swallow Goals     Swallow Study Prior Functional Status       General HPI: Mr. CONLEY DELISLE is a 66 y.o. 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 Treated with.   IV t-PA  And rescue PTA/stenting of proximal LICA stenosis and mechanical embolectomy using Trevo stent rettreiver with complete recanalization of Lt MCA. On aspirin prior to admission. Now on Aspirin   for secondary stroke prevention. Patient with resultant  aphasia and rt hemiparesis. Pt intubated from 10/12 to 10/13.  Type of Study: Bedside swallow evaluation Diet Prior to this Study: NPO Behavior/Cognition: Confused;Requires cueing;Doesn't follow directions;Alert Oral Cavity - Dentition: Adequate natural dentition Self-Feeding Abilities: Needs assist Patient Positioning: Upright in bed Baseline Vocal Quality: Wet Volitional Cough: Cognitively unable to elicit Volitional Swallow: Unable to elicit    Oral/Motor/Sensory Function Overall Oral Motor/Sensory Function: Impaired (Cannot follow commands ) Labial ROM: Reduced right Labial Symmetry: Abnormal symmetry right Labial Strength: Reduced Labial Sensation: Reduced Lingual ROM: Reduced right Lingual Symmetry: Abnormal symmetry right Lingual Strength: Reduced Facial Symmetry: Right droop   Ice Chips Ice chips: Impaired Presentation: Spoon Oral Phase Impairments: Poor awareness of bolus;Impaired anterior to posterior transit;Reduced lingual movement/coordination Oral Phase Functional Implications: Oral residue;Oral holding;Prolonged oral transit Pharyngeal Phase Impairments: Suspected delayed Swallow;Decreased hyoid-laryngeal movement;Wet Vocal Quality;Throat Clearing - Delayed   Thin Liquid Thin Liquid: Impaired Presentation: Cup Oral Phase Impairments: Poor awareness of bolus Pharyngeal  Phase Impairments: Multiple swallows;Cough - Delayed;Decreased  hyoid-laryngeal movement;Suspected delayed Swallow    Nectar Thick Nectar Thick Liquid: Not tested   Honey Thick Honey Thick Liquid: Not  tested   Puree Puree: Impaired Presentation: Spoon Oral Phase Impairments: Poor awareness of bolus Oral Phase Functional Implications: Prolonged oral transit Pharyngeal Phase Impairments: Suspected delayed Swallow;Decreased hyoid-laryngeal movement;Multiple swallows;Throat Clearing - Delayed;Wet Vocal Quality;Change in Vital Signs   Solid   GO           Harlon Ditty, MA CCC-SLP [phone]  Claudine Mouton 05/13/2012,11:02 AM

## 2012-05-13 NOTE — Evaluation (Signed)
Speech Language Pathology Evaluation Patient Details Name: Paul Bass MRN: [id number] DOB: [birth year] Today's Date: 05/13/2012 Time: 5284-1324 SLP Time Calculation (min): 48 min  Problem List:  Patient Active Problem List  Diagnosis  . Stroke, acute, thrombotic  . Symptomatic carotid artery stenosis  . HTN (hypertension)  . COPD (chronic obstructive pulmonary disease)  . Acute respiratory failure   Past Medical History:  Past Medical History  Diagnosis Date  . HTN (hypertension)   . CAD S/P percutaneous coronary angioplasty   . Hyperlipidemia   . COPD (chronic obstructive pulmonary disease)   . Tobacco abuse   . Hypothyroidism   . Depression   . Anxiety   . BPH (benign prostatic hypertrophy)    Past Surgical History:  Past Surgical History  Procedure Date  . 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 Rotator cuff replaced     right  . Pins r lower leg    HPI:  [name] is a 66 y.o. 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 Treated with.   IV t-PA  And rescue PTA/stenting of proximal LICA stenosis and mechanical embolectomy using Trevo stent rettreiver with complete recanalization of Lt MCA. On aspirin prior to admission. Now on Aspirin   for secondary stroke prevention. Patient with resultant  aphasia and rt hemiparesis. Pt intubated from 10/12 to 10/13.    Assessment / Plan / Recommendation Clinical Impression  Pt presents with global aphasia with severely impaired expressive deficits. Pt did phonate once during assessment but made no effot with automatic tasks despite max cues. Pt demonstrated moderate to severe receptive deficits. WIth max visual and tactile cues pt did have some gestural/physical responses to commands (head nod though incorrect, oral movements). Accuracy impaired by perseveration. 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 Cognition also appears impaired with pt demonstrating limited functional problem solving ability with basic tasks. Pt would benefit from further acute SLp therapy for basic expression of wants/needs and participation in ADLs. Suggest CIR consult at d/c.     SLP Assessment  Patient needs continued Speech Lanaguage Pathology Services    Follow Up Recommendations  Inpatient Rehab    Frequency and Duration min 2x/week  2 weeks   Pertinent Vitals/Pain NA   SLP Goals  SLP Goals Potential to Achieve Goals: Fair Progress/Goals/Alternative treatment plan discussed with pt/caregiver and they: Agree SLP Goal #1: Pt will verbalize during automatic speech tasks with max contextual cues x1.  SLP Goal #2: Pt will nod yes/no to basic biographical questions with max assist with 80% accuracy.  SLP Goal #3: Pt will complete basic functional task x1 with max contextual cues  SLP Evaluation Prior Functioning  Cognitive/Linguistic Baseline: Within functional limits Type of Home: House Lives With: Spouse Available Help at Discharge: Other (Comment) Vocation: Full time employment   Cognition  Overall Cognitive Status: Impaired Arousal/Alertness: Awake/alert Orientation Level: Other (comment) (unable to express orientation due to language deficits. ) Attention: Focused;Sustained Focused Attention: Appears intact Sustained Attention: Impaired Sustained Attention Impairment: Verbal basic;Functional basic Memory:  (UTA) Awareness:  (UTA) Problem Solving: Impaired Problem Solving Impairment: Functional basic;Verbal basic Executive Function: Reasoning Reasoning: Impaired Reasoning Impairment: Functional basic Comments: Perseveration, no functional use of objects - spoon    Comprehension  Auditory Comprehension Overall Auditory Comprehension: Impaired Yes/No Questions: Impaired Basic Biographical Questions: 0-25% accurate Commands:  Impaired One Step Basic Commands: 0-24% accurate    Expression Verbal Expression Overall Verbal Expression: Impaired Initiation: Impaired Automatic Speech:  (None) Level of Generative/Spontaneous Verbalization:  (phonation x1) Repetition: Impaired Level of Impairment: Word level Naming: Impairment Confrontation: Impaired Effective Techniques: Melodic intonation (Tactile cues) Non-Verbal Means of Communication: Gestures Written Expression Dominant Hand: Right   Oral / Motor Oral Motor/Sensory Function Overall Oral Motor/Sensory Function: Impaired Labial ROM: Reduced right Labial Symmetry: Abnormal symmetry right Labial Strength: Reduced Labial Sensation: Reduced Lingual ROM: Reduced right Lingual Symmetry: Abnormal symmetry right Lingual Strength: Reduced Facial Symmetry: Right droop Motor Speech Overall Motor Speech: Other (comment) (Pt with one attempt at phonation, no articulation) Motor Planning: Impaired Level of Impairment:  (impairment with POs)   GO    Harlon Ditty, MA CCC-SLP [phone]  Natalye Kott, Riley Nearing 05/13/2012, 1:20 PM

## 2012-05-14 ENCOUNTER — Inpatient Hospital Stay (HOSPITAL_COMMUNITY): Payer: Medicare Other

## 2012-05-14 DIAGNOSIS — R4701 Aphasia: Secondary | ICD-10-CM

## 2012-05-14 DIAGNOSIS — R1310 Dysphagia, unspecified: Secondary | ICD-10-CM | POA: Diagnosis present

## 2012-05-14 LAB — GLUCOSE, CAPILLARY
Glucose-Capillary: 119 mg/dL — ABNORMAL HIGH (ref 70–99)
Glucose-Capillary: 122 mg/dL — ABNORMAL HIGH (ref 70–99)
Glucose-Capillary: 124 mg/dL — ABNORMAL HIGH (ref 70–99)

## 2012-05-14 MED ORDER — ADULT MULTIVITAMIN W/MINERALS CH
1.0000 | ORAL_TABLET | Freq: Every day | ORAL | Status: DC
  Administered 2012-05-14 – 2012-05-17 (×4): 1 via ORAL
  Filled 2012-05-14 (×4): qty 1

## 2012-05-14 MED ORDER — JEVITY 1.2 CAL PO LIQD
1000.0000 mL | ORAL | Status: DC
  Administered 2012-05-14 – 2012-05-15 (×3)
  Administered 2012-05-15: 1000 mL
  Filled 2012-05-14 (×8): qty 1000

## 2012-05-14 MED ORDER — CITALOPRAM HYDROBROMIDE 20 MG PO TABS
20.0000 mg | ORAL_TABLET | Freq: Every day | ORAL | Status: DC
  Administered 2012-05-14 – 2012-05-17 (×4): 20 mg via ORAL
  Filled 2012-05-14 (×4): qty 1

## 2012-05-14 MED ORDER — LOSARTAN POTASSIUM 50 MG PO TABS
50.0000 mg | ORAL_TABLET | Freq: Every day | ORAL | Status: DC
  Administered 2012-05-14: 50 mg via ORAL
  Filled 2012-05-14 (×2): qty 1

## 2012-05-14 MED ORDER — CHOLECALCIFEROL 10 MCG (400 UNIT) PO TABS
800.0000 [IU] | ORAL_TABLET | Freq: Every day | ORAL | Status: DC
  Administered 2012-05-14 – 2012-05-17 (×4): 800 [IU] via ORAL
  Filled 2012-05-14 (×4): qty 2

## 2012-05-14 MED ORDER — PANTOPRAZOLE SODIUM 40 MG PO TBEC: 40.0000 mg | DELAYED_RELEASE_TABLET | Freq: Every day | ORAL | Status: DC

## 2012-05-14 MED ORDER — TAMSULOSIN HCL 0.4 MG PO CAPS
0.4000 mg | ORAL_CAPSULE | Freq: Every day | ORAL | Status: DC
  Administered 2012-05-14 – 2012-05-17 (×4): 0.4 mg via ORAL
  Filled 2012-05-14 (×4): qty 1

## 2012-05-14 MED ORDER — ATORVASTATIN CALCIUM 20 MG PO TABS
20.0000 mg | ORAL_TABLET | Freq: Every day | ORAL | Status: DC
  Administered 2012-05-14 – 2012-05-16 (×3): 20 mg via ORAL
  Filled 2012-05-14 (×5): qty 1

## 2012-05-14 MED ORDER — FREE WATER
200.0000 mL | Freq: Four times a day (QID) | Status: DC
  Administered 2012-05-14 – 2012-05-17 (×12): 200 mL

## 2012-05-14 MED ORDER — JEVITY 1.2 CAL PO LIQD
1000.0000 mL | ORAL | Status: DC
  Filled 2012-05-14 (×3): qty 1000

## 2012-05-14 MED ORDER — BUDESONIDE-FORMOTEROL FUMARATE 160-4.5 MCG/ACT IN AERO
2.0000 | INHALATION_SPRAY | Freq: Two times a day (BID) | RESPIRATORY_TRACT | Status: DC
  Administered 2012-05-15 – 2012-05-16 (×4): 2 via RESPIRATORY_TRACT
  Filled 2012-05-14: qty 6

## 2012-05-14 MED ORDER — OMEGA-3-ACID ETHYL ESTERS 1 G PO CAPS
5.0000 g | ORAL_CAPSULE | Freq: Every day | ORAL | Status: DC
  Filled 2012-05-14 (×4): qty 5

## 2012-05-14 MED ORDER — LEVOTHYROXINE SODIUM 150 MCG PO TABS
150.0000 ug | ORAL_TABLET | Freq: Every day | ORAL | Status: DC
  Administered 2012-05-14 – 2012-05-17 (×4): 150 ug via ORAL
  Filled 2012-05-14 (×5): qty 1

## 2012-05-14 NOTE — Progress Notes (Signed)
Speech Language Pathology Treatment Patient Details Name: Paul Bass MRN: [id number] DOB: 03/12/1946 Today's Date: 05/14/2012 Time: 4696-2952 SLP Time Calculation (min): 55 min  Assessment / Plan / Recommendation Clinical Impression  Pt demonstrated significant improvement in language today. With max visual/gestural cues pt correctly nodded head "yes and no" during a matching game with 80% accuracy. With max phonemic cues pt correctly verbalized his name during automatic language task. Pt also verbalized "yes" "no"  and counted with max cues. SLP provided education to family to maximize functional communication.   Pt continued to demonstrate impaired swallow function with minimal sips of nectar thick liquids. Max cues required for multiple swallows and throat clearing. SLP understands pt is only here pending resolution of dysphagia. Pt would benefit from maximum amount of time prior to repeat evaluation of objective swallow test. If aspiration risk continues to be high pt will need PEG placement. MD please clearly document when would be the latest day that SLP could repeat test considering length of stay.     SLP Plan  Continue with current plan of care    Pertinent Vitals/Pain NA  SLP Goals  SLP Goals Potential to Achieve Goals: Fair Progress/Goals/Alternative treatment plan discussed with pt/caregiver and they: Agree SLP Goal #1: Pt will verbalize during automatic speech tasks with max contextual cues x1.  SLP Goal #1 - Progress: Progressing toward goal SLP Goal #2: Pt will nod yes/no to basic biographical questions with max assist with 80% accuracy.  SLP Goal #2 - Progress: Progressing toward goal SLP Goal #3: Pt will complete basic functional task x1 with max contextual cues SLP Goal #3 - Progress: Progressing toward goal  General Temperature Spikes Noted: No Respiratory Status: Supplemental O2 delivered via (comment) Behavior/Cognition: Alert;Cooperative;Pleasant mood;Requires  cueing Oral Cavity - Dentition: Adequate natural dentition Patient Positioning: Upright in bed  Oral Cavity - Oral Hygiene     Treatment Treatment focused on: Aphasia;Other (comment) (Dyspahgia) Skilled Treatment: visual, verbal gestural cues, positive reinforcement, effortful swallow, therapeutic trials.    GO    Harlon Ditty, MA CCC-SLP [phone]  Claudine Mouton 05/14/2012, 4:45 PM

## 2012-05-14 NOTE — Progress Notes (Signed)
Physical Therapy Treatment Patient Details Name: Paul Bass MRN: [id number] DOB: [birth year] Today's Date: 05/14/2012 Time: 1010-1041 PT Time Calculation (min): 31 min  PT Assessment / Plan / Recommendation Comments on Treatment Session  Patient is progressing with ambulation. Patient has been ambulating with staff therefore CIR states patient at too high of level for inpatient rehab. Patient should progress to outpatient therapy once discharged and MD aware. Wife will be at home with patient 24/7 for the next 13-weeks    Follow Up Recommendations  Outpatient PT;Supervision for mobility/OOB     Does the patient have the potential to tolerate intense rehabilitation     Barriers to Discharge        Equipment Recommendations  None recommended by PT;None recommended by OT    Recommendations for Other Services    Frequency Min 4X/week   Plan Discharge plan needs to be updated;Frequency remains appropriate    Precautions / Restrictions Precautions Precautions: Fall   Pertinent Vitals/Pain     Mobility  Bed Mobility Supine to Sit: 5: Supervision Transfers Sit to Stand: 4: Min guard;With upper extremity assist;From toilet;From bed Stand to Sit: 4: Min guard;With upper extremity assist;To toilet;To bed Details for Transfer Assistance: Cues for safe hand placement and positioning Ambulation/Gait Ambulation/Gait Assistance: 4: Min assist Ambulation Distance (Feet): 300 Feet Assistive device: Rolling walker Ambulation/Gait Assistance Details: Patient with tendency to lean to right side and LOB to right with right side turns. Cues for positioning inside of RW.  Gait Pattern: Step-through pattern;Decreased stride length    Exercises     PT Diagnosis:    PT Problem List:   PT Treatment Interventions:     PT Goals Acute Rehab PT Goals PT Goal: Supine/Side to Sit - Progress: Progressing toward goal PT Goal: Sit to Stand - Progress: Progressing toward goal PT Transfer Goal:  Bed to Chair/Chair to Bed - Progress: Progressing toward goal PT Goal: Ambulate - Progress: Progressing toward goal  Visit Information  Last PT Received On: 05/14/12 Assistance Needed: +1    Subjective Data      Cognition  Overall Cognitive Status: Impaired Area of Impairment: Following commands;Safety/judgement Arousal/Alertness: Awake/alert Behavior During Session: [facility] for tasks performed Following Commands: Follows one step commands inconsistently Cognition - Other Comments: Difficult to fully assess due to aphasia    Balance     End of Session PT - End of Session Equipment Utilized During Treatment: Gait belt Activity Tolerance: Patient tolerated treatment well Patient left: with call bell/phone within reach;in bed Nurse Communication: Mobility status   GP     Fredrich Birks 05/14/2012, 12:06 PM  05/14/2012 Fredrich Birks PTA [phone] pager [phone] office

## 2012-05-14 NOTE — Progress Notes (Signed)
Stroke Team Progress Note  HISTORY JONNIE TRUXILLO is an 66 y.o. male history of hypertension, hyperlipidemia, coronary artery disease and COPD, who developed acute onset of receptive and expressive aphasia as well as right hemiplegia at about 6:10 PM on 05/11/2012. There is no previous history of stroke nor TIA. Patient has been taking aspirin daily. He presented initially to the emergency room at [facility]. CT scan of his head showed hyperdensity of the left middle cerebral artery, indicative of probable thrombus. NIH stroke score was 17. Laboratory studies were unremarkable. He was given intravenous thrombolytic therapy with TPA. There was a return of movement of his right upper and lower extremities. Profound aphasia was persistent, however. Patient was subsequently transferred to [facility] for further management, including interventional radiology evaluation and treatment for probable left MCA occlusion. Patient was taken to the angiogram suite on arrival. Carotid angiogram showed occlusion left middle cerebral artery proximally, as well as moderately severe stenosis of proximal left ICA. Revascularization of the left MCA was achieved using Trevo stent retreiver x 1 attempt, and  Angioplasty and stenting of left proximal ICA was performed. Patient was subsequently transferred to neuro intensive care unit for further management.  SUBJECTIVE Failed FEES yesterday. Too high level for CIR; OP therapies recommended.  OBJECTIVE Most recent Vital Signs: Filed Vitals:   05/14/12 0159 05/14/12 0324 05/14/12 0444 05/14/12 1000  BP: 137/60  153/67 147/59  Pulse: 54  57 56  Temp: 99.4 F (37.4 C)  98.8 F (37.1 C) 98.3 F (36.8 C)  TempSrc: Oral  Oral Oral  Resp: 20  20 18   Height:      Weight:      SpO2: 97% 95% 92% 94%   CBG (last 3)   Basename 05/14/12 0846 05/14/12 0447 05/14/12 0011  GLUCAP 119* 114* 119*    IV Fluid Intake:      . sodium chloride 75  mL/hr (05/13/12 1900)    MEDICATIONS     . albuterol  2.5 mg Nebulization Q4H  . antiseptic oral rinse  15 mL Mouth Rinse QID  . aspirin  300 mg Rectal Daily  . chlorhexidine  15 mL Mouth Rinse BID  . clopidogrel  75 mg Oral Once  . enoxaparin (LOVENOX) injection  40 mg Subcutaneous Q24H  . insulin aspart  0-15 Units Subcutaneous Q4H  . ipratropium  0.5 mg Nebulization Q4H  . levothyroxine  150 mcg Oral QAC breakfast  . nicotine  21 mg Transdermal Daily  . pantoprazole (PROTONIX) IV  40 mg Intravenous QHS   PRN:  acetaminophen, acetaminophen, albuterol, HYDROmorphone (DILAUDID) injection, labetalol, meperidine (DEMEROL) injection, ondansetron (ZOFRAN) IV  Diet:  NPO  Activity:  OOB DVT Prophylaxis:  Lovenox 40 mg sq daily   CLINICALLY SIGNIFICANT STUDIES Basic Metabolic Panel:   Lab 05/13/12  NA 141  K 3.9  CL 107  CO2 26  GLUCOSE 119*  BUN 10  CREATININE 0.83  CALCIUM 8.5  MG --  PHOS --   Liver Function Tests: No results found for this basename: AST:2,ALT:2,ALKPHOS:2,BILITOT:2,PROT:2,ALBUMIN:2 in the last 168 hours CBC:   Lab 05/13/12  WBC 9.2  NEUTROABS 7.6  HGB 13.2  HCT 38.4*  MCV 97.7  PLT 117*   Cardiac Enzymes:   Lab 05/12/12 1400 05/12/12 0820  CKTOTAL -- --  CKMB -- --  CKMBINDEX -- --  TROPONINI <0.30 <0.30   Lipid Panel     Component Value Date/Time   CHOL 119 05/13/2012 0000  TRIG 76 05/13/2012 0000   HDL 40 05/13/2012 0000   CHOLHDL 3.0 05/13/2012 0000   VLDL 15 05/13/2012 0000   LDLCALC 64 05/13/2012 0000    HgbA1C  Lab Results  Component Value Date   HGBA1C 6.3* 05/13/2012   Urine Drug Screen:   No results found for this basename: labopia,  cocainscrnur,  labbenz,  amphetmu,  thcu,  labbarb    Alcohol Level: No results found for this basename: ETH:2 in the last 168 hours  CT Head  05/12/2012   No intracranial hemorrhage following TPA therapy.  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  No new abnormalities.   05/12/2012   1.  Evolving left MCA territory infarct involving the left insular cortex and frontal operculum without evidence for hemorrhagic conversion. 2.  The study is mildly degraded by patient motion.   05/12/2012   No evidence of acute intracranial abnormality.     Cerebral Angio Lt common carotid arteriogram followed by endovascular complete revascularization of oclluded Lt MCA with x1 pass of TREVO PRO vue Stent retriever device ,and stent assisted angioplasty of symptomatic pre occlusive Lt ICA Stenosis .  MRI of the brain  05/13/2012 Moderate to large sized left hemispheric infarct possibly with minimal hemorrhage at the opercular level. Small acute infarct right occipital lobe.    MRA of the brain 05/13/2012  Motion degraded exam.  Grading stenosis or evaluating for aneurysm is limited.   2D Echocardiogram EF 50-55% with no source of embolus.   Carotid Doppler see angio  CXR 05/12/2012 Endotracheal tube and nasogastric tube in appropriate position.  No active lung disease.    EKG  Supraventricular tachycardia. T wave abnormality, consider inferior ischemia  Therapy Recommendations OP PT, OT and language  therapy  Physical Exam  Middle aged Caucasian male intubated.Awake alert. Afebrile. Head is nontraumatic. Neck is supple without bruit. Hearing is normal. Cardiac exam no murmur or gallop. Lungs are clear to auscultation. Distal pulses are well felt. Bilateral groin arterial sheaths present Neurological Exam : awake alert and significant expressive greater than receptive aphasia. Mumbles and speaks only occasional words.he will follow only few midline commands. Mild left gaze preference but can look to the right. Does not blink to threat on the right but does so on the left.Mild right lower facial weakness. No right upper or lower extremity drift but diminished fine finger movements on the right. Sensation, coordination and gait cannot be reliably  tested. Right plantar is equivocal left is downgoing.  ASSESSMENT Mr. TARRY FOUNTAIN is a 66 y.o. 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 Treated with IV t-PA at [facility] and transferred to [facility] where he received rescue PTA/stenting of proximal LICA stenosis and mechanical embolectomy of the left M1 using Trevo stent retriever. Patient with resultant complete recanalization of Lt MCA. On aspirin prior to admission. Now on Aspirin suppository. Need to add plavix given recent stent when able to swallow. Both for secondary stroke prevention. Patient with resultant expressive aphasia and rt hemiparesis.   cigarette smoker, 2 PPD, placed on nicotine patch  COPD Hypertension Hyperlipidemia, LDL 64, on statin PTA, goal LDL < 100 Depression HgbA1c 6.3  Hospital day # 3  TREATMENT/PLAN  Place NG tube for feeding and medicines. Start Plavix once ngt in place.  ST reassess swallow tomorrow  Continue  aspirin. Add Plavix given carotid stent.  Wife at the bedside. Plan home with wife w/ OP therapies once swallowing or PEG placed   Annie Main, MSN, RN, ANVP-BC, ANP-BC, GNP-BC [facility] Pager: [phone] 05/14/2012 10:34 AM  Scribe for Dr. Delia Heady, Stroke Center Medical Director, who has personally reviewed chart, pertinent data, examined the patient and developed the plan of care. Pager:  [phone]

## 2012-05-14 NOTE — Progress Notes (Signed)
INITIAL ADULT NUTRITION ASSESSMENT Date: 05/14/2012   Time: 11:55 AM Reason for Assessment: Consult received to initiate and manage enteral nutrition support.  INTERVENTION: Initiate Jevity 1.2 @ 20 ml/hr via nasoenteric feeding tube and increase by 10 ml every 4 hours to goal rate of 80 ml/hr. At goal rate, tube feeding regimen will provide 2304 kcal, 106 grams of protein, and 1555 ml of H2O.  200 ml H2O flush QID Total free water: 2355 ml/day   DOCUMENTATION CODES Per approved criteria  -Obesity Unspecified    ASSESSMENT: Male 66 y.o.  Dx: Stroke, acute, thrombotic  Hx:  Past Medical History  Diagnosis Date  . HTN (hypertension)   . CAD S/P percutaneous coronary angioplasty   . Hyperlipidemia   . COPD (chronic obstructive pulmonary disease)   . Tobacco abuse   . Hypothyroidism   . Depression   . Anxiety   . BPH (benign prostatic hypertrophy)    Past Surgical History  Procedure Date  . 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 Rotator cuff replaced     right  . Pins r lower leg    Related Meds:     . albuterol  2.5 mg Nebulization Q4H  . antiseptic oral rinse  15 mL Mouth Rinse QID  . atorvastatin  20 mg Oral q1800  . budesonide-formoterol  2 puff Inhalation BID  . chlorhexidine  15 mL Mouth Rinse BID  . cholecalciferol  800 Units Oral Daily  . citalopram  20 mg Oral Daily  . clopidogrel  75 mg Oral Once  . enoxaparin (LOVENOX) injection  40 mg Subcutaneous Q24H  . insulin aspart  0-15 Units Subcutaneous Q4H  . ipratropium  0.5 mg Nebulization Q4H  . levothyroxine  150 mcg Oral QAC breakfast  . levothyroxine  150 mcg Oral QAC breakfast  . losartan  50 mg Oral Daily  . multivitamin with minerals  1 tablet Oral Daily  . nicotine  21 mg Transdermal Daily  . omega-3 acid ethyl esters  5 g Oral Daily  . pantoprazole  40 mg Oral Q1200  . pantoprazole (PROTONIX) IV  40 mg Intravenous QHS  . Tamsulosin HCl  0.4 mg Oral  Daily  . DISCONTD: aspirin  300 mg Rectal Daily   Ht: 6' (182.9 cm)  Wt: 238 lb 15.7 oz (108.4 kg)  Ideal Wt: 80.9 kg % Ideal Wt: 134%  Usual Wt:  Wt Readings from Last 10 Encounters:  05/13/12 238 lb 15.7 oz (108.4 kg)  05/13/12 238 lb 15.7 oz (108.4 kg)   % Usual Wt: 100%  Body mass index is 32.41 kg/(m^2). Class I Obesity  Food/Nutrition Related Hx: Per pt/wife no recent weight changes, good appetite PTA  Labs:  CMP     Component Value Date/Time   NA 141 05/13/2012 0000   K 3.9 05/13/2012 0000   CL 107 05/13/2012 0000   CO2 26 05/13/2012 0000   GLUCOSE 119* 05/13/2012 0000   BUN 10 05/13/2012 0000   CREATININE 0.83 05/13/2012 0000   CALCIUM 8.5 05/13/2012 0000   GFRNONAA 90* 05/13/2012 0000   GFRAA >90 05/13/2012 0000   CBG (last 3)   Basename 05/14/12 1133 05/14/12 0846 05/14/12 0447  GLUCAP 120* 119* 114*   Lab Results  Component Value Date   HGBA1C 6.3* 05/13/2012   Lipid Panel     Component Value Date/Time   CHOL 119 05/13/2012 0000   TRIG 76 05/13/2012 0000  HDL 40 05/13/2012 0000   CHOLHDL 3.0 05/13/2012 0000   VLDL 15 05/13/2012 0000   LDLCALC 64 05/13/2012 0000    Intake/Output Summary (Last 24 hours) at 05/14/12 1157 Last data filed at 05/14/12 0700  Gross per 24 hour  Intake      0 ml  Output    550 ml  Net   -550 ml   Diet Order: NPO  Supplements/Tube Feeding: none  IVF:    sodium chloride Last Rate: 75 mL/hr (05/13/12 1900)  feeding supplement (JEVITY 1.2 CAL)    Pt transferred from [facility] after receiving tPA for L MCA. Pt had revascularization of the left MCA which was achieved using Trevo stent retreiver x 1 attempt, and Angioplasty and stenting of left proximal ICA was performed. Pt has failed swallow eval and plans to begin enteral nutrition. Per MD will likely d/c home once pt can swallow or PEG placed. 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 SLP recommends MBS with improvement.   Estimated Nutritional Needs:   Kcal:   2150-2350 Protein:  105-115 grams Fluid:  >2.1 L/day  NUTRITION DIAGNOSIS: Inadequate oral intake r/t inability to eat AEB NPO status.  MONITORING/EVALUATION(Goals): Goal: Pt to meet >/= 90% of their estimated nutrition needs. Monitor: TF tolerance, weight, swallow ability  EDUCATION NEEDS: -Education needs addressed; answered family questions about TF  Kendell Bane RD, LDN, CNSC [phone] Pager [phone] After Hours Pager  05/14/2012, 11:55 AM

## 2012-05-14 NOTE — Progress Notes (Signed)
Collaborated with this pt and note. 05/14/2012  Pierce Bing, PT [phone] (pager)

## 2012-05-14 NOTE — Progress Notes (Signed)
NG tube placed, verified with Xray and auscultation, advanced NG tube x 3 times per radiology.  NG flushes well, patient tolerated meds through NG well.  Will start feeds once available.

## 2012-05-14 NOTE — Progress Notes (Signed)
Dr. Riley Kill observed patient ambulating with nursing and his wife this morning with min guard assist to supervision level. At this level, feel he does not meet the need for intense inpt rehab stay at this time. I discussed with his wife at bedside that his speech and swallowing issues are his therapy needs at this time. Recommend home with outpatient therapy once means of nutrition established. I discussed the recommendation for outpatient therapy over the benefits of home health  If she could provide transportation. She plans to take 13 weeks of FMLA and can drive him from [place] to the neuro rehab office if arranged. I will discuss with Annie Main, GNP. Please call me for any questions. [phone] I will alert RN CM

## 2012-05-15 LAB — GLUCOSE, CAPILLARY
Glucose-Capillary: 123 mg/dL — ABNORMAL HIGH (ref 70–99)
Glucose-Capillary: 129 mg/dL — ABNORMAL HIGH (ref 70–99)
Glucose-Capillary: 131 mg/dL — ABNORMAL HIGH (ref 70–99)

## 2012-05-15 MED ORDER — LOSARTAN POTASSIUM 50 MG PO TABS
100.0000 mg | ORAL_TABLET | Freq: Every day | ORAL | Status: DC
  Administered 2012-05-15 – 2012-05-17 (×3): 100 mg via ORAL
  Filled 2012-05-15 (×4): qty 2

## 2012-05-15 MED ORDER — PNEUMOCOCCAL VAC POLYVALENT 25 MCG/0.5ML IJ INJ
0.5000 mL | INJECTION | INTRAMUSCULAR | Status: AC
  Filled 2012-05-15: qty 0.5

## 2012-05-15 NOTE — Progress Notes (Signed)
Occupational Therapy Treatment Patient Details Name: Paul Bass MRN: [id number] DOB: 08-08-1945 Today's Date: 05/15/2012 Time: 4098-1191 OT Time Calculation (min): 25 min  OT Assessment / Plan / Recommendation Comments on Treatment Session      Follow Up Recommendations  Supervision/Assistance - 24 hour;Home health OT    Barriers to Discharge       Equipment Recommendations  None recommended by PT;None recommended by OT    Recommendations for Other Services    Frequency Min 3X/week   Plan Discharge plan remains appropriate    Precautions / Restrictions Precautions Precautions: Fall Restrictions Weight Bearing Restrictions: No   Pertinent Vitals/Pain No c/o signs or symptoms of pain    ADL  Lower Body Bathing: Simulated;Minimal assistance (with washcloth; pt used R hand without cues) Where Assessed - Lower Body Bathing: Unsupported sitting Upper Body Dressing: Performed;Minimal assistance (tie gown) Where Assessed - Upper Body Dressing: Unsupported sitting Lower Body Dressing: Performed;Minimal assistance (socks:  crossing legs) Transfers/Ambulation Related to ADLs: Pt was sleeping soundly when I arrived.  Visitor said OK for me to wake him up.  Sat EOB with supervision and rail, mod multimodal cues ADL Comments: Simulated bathing.  Pt using RUE without cues for gross motor tasks.  Worked on RUE and Bil coordination.  Pt appears to have decreased sensation in RUE based on dropping items not fully grasping.  Was able to feel deep pressure when tested.  Pt was able to sustain grasp when asked to hold items to a count of 10.  Also manipulated and placed items (lotion, pen, comb). Wrote out exercises/coordination activities for visitor to practice with him.  Finger abduction and touching tip of 2nd and 3rd digits to thumb as well as activities just described.  When opening containers, pt sometimes switched focus of dominant hand to L:  cued to use R.  Pt was able to tie gown with  gown on lap with difficulty--did not add this to HEP    OT Diagnosis:    OT Problem List:   OT Treatment Interventions:     OT Goals Acute Rehab OT Goals Time For Goal Achievement: 05/27/12 ADL Goals Pt Will Perform Upper Body Dressing: with set-up;Sitting, chair;Sitting, bed ADL Goal: Upper Body Dressing - Progress: Progressing toward goals Pt Will Perform Lower Body Dressing: with set-up;with supervision;Sit to stand from chair;Sit to stand from bed ADL Goal: Lower Body Dressing - Progress: Progressing toward goals Miscellaneous OT Goals Miscellaneous OT Goal #1: Pt will follow RUE/Bil UE coordination and exercise program with supervision/visual cues OT Goal: Miscellaneous Goal #1 - Progress: Goal set today  Visit Information  Last OT Received On: 05/15/12 Assistance Needed: +1    Subjective Data  Subjective: Nodded yes and no to questions   Prior Functioning  Communication Communication: Receptive difficulties;Expressive difficulties Dominant Hand: Right    Cognition  Cognition - Other Comments: pt has language deficits.  Able to follow visual commands.      Mobility  Shoulder Instructions Bed Mobility Supine to Sit: 5: Supervision       Exercises      Balance     End of Session OT - End of Session Activity Tolerance: Patient tolerated treatment well Patient left: with call bell/phone within reach;in bed;with family/visitor present  GO     Paul Bass 05/15/2012, 2:29 PM Paul Bass, OTR/L [phone] 05/15/2012

## 2012-05-15 NOTE — Progress Notes (Signed)
Stroke Team Progress Note  HISTORY Paul Bass is an 66 y.o. male history of hypertension, hyperlipidemia, coronary artery disease and COPD, who developed acute onset of receptive and expressive aphasia as well as right hemiplegia at about 6:10 PM on 05/11/2012. There is no previous history of stroke nor TIA. Patient has been taking aspirin daily. He presented initially to the emergency room at [facility]. CT scan of his head showed hyperdensity of the left middle cerebral artery, indicative of probable thrombus. NIH stroke score was 17. Laboratory studies were unremarkable. He was given intravenous thrombolytic therapy with TPA. There was a return of movement of his right upper and lower extremities. Profound aphasia was persistent, however. Patient was subsequently transferred to [facility] for further management, including interventional radiology evaluation and treatment for probable left MCA occlusion. Patient was taken to the angiogram suite on arrival. Carotid angiogram showed occlusion left middle cerebral artery proximally, as well as moderately severe stenosis of proximal left ICA. Revascularization of the left MCA was achieved using Trevo stent retreiver x 1 attempt, and  Angioplasty and stenting of left proximal ICA was performed. Patient was subsequently transferred to neuro intensive care unit for further management.  SUBJECTIVE Wife at bedside. Tongue movement increasing and able to speak a few words and cough a little..  OBJECTIVE Most recent Vital Signs: Filed Vitals:   05/15/12 0113 05/15/12 0358 05/15/12 0408 05/15/12 0957  BP:  172/64  177/66  Pulse:  50  50  Temp:  98.3 F (36.8 C)  98.1 F (36.7 C)  TempSrc:  Oral  Oral  Resp:  20  20  Height:      Weight:      SpO2: 94% 97% 96% 97%   CBG (last 3)   Basename 05/15/12 0827 05/15/12 0356 05/15/12 0045  GLUCAP 118* 132* 112*    IV Fluid Intake:      . sodium chloride 75 mL/hr at  05/15/12 0443  . feeding supplement (JEVITY 1.2 CAL) 80 mL/hr at 05/14/12 1728  . DISCONTD: feeding supplement (JEVITY 1.2 CAL)      MEDICATIONS     . albuterol  2.5 mg Nebulization Q4H  . antiseptic oral rinse  15 mL Mouth Rinse QID  . atorvastatin  20 mg Oral q1800  . budesonide-formoterol  2 puff Inhalation BID  . chlorhexidine  15 mL Mouth Rinse BID  . cholecalciferol  800 Units Oral Daily  . citalopram  20 mg Oral Daily  . clopidogrel  75 mg Oral Once  . enoxaparin (LOVENOX) injection  40 mg Subcutaneous Q24H  . free water  200 mL Per Tube Q6H  . insulin aspart  0-15 Units Subcutaneous Q4H  . ipratropium  0.5 mg Nebulization Q4H  . levothyroxine  150 mcg Oral QAC breakfast  . levothyroxine  150 mcg Oral QAC breakfast  . losartan  50 mg Oral Daily  . multivitamin with minerals  1 tablet Oral Daily  . nicotine  21 mg Transdermal Daily  . omega-3 acid ethyl esters  5 g Oral Daily  . pantoprazole (PROTONIX) IV  40 mg Intravenous QHS  . pneumococcal 23 valent vaccine  0.5 mL Intramuscular Tomorrow-1000  . Tamsulosin HCl  0.4 mg Oral Daily  . DISCONTD: aspirin  300 mg Rectal Daily  . DISCONTD: pantoprazole  40 mg Oral Q1200   PRN:  acetaminophen, acetaminophen, albuterol, HYDROmorphone (DILAUDID) injection, labetalol, meperidine (DEMEROL) injection, ondansetron (ZOFRAN) IV  Diet:  NPO  Activity:  OOB DVT Prophylaxis:  Lovenox 40 mg sq daily   CLINICALLY SIGNIFICANT STUDIES Basic Metabolic Panel:   Lab 05/13/12  NA 141  K 3.9  CL 107  CO2 26  GLUCOSE 119*  BUN 10  CREATININE 0.83  CALCIUM 8.5  MG --  PHOS --   Liver Function Tests: No results found for this basename: AST:2,ALT:2,ALKPHOS:2,BILITOT:2,PROT:2,ALBUMIN:2 in the last 168 hours CBC:   Lab 05/13/12  WBC 9.2  NEUTROABS 7.6  HGB 13.2  HCT 38.4*  MCV 97.7  PLT 117*   Cardiac Enzymes:   Lab 05/12/12 1400 05/12/12 0820  CKTOTAL -- --  CKMB -- --  CKMBINDEX -- --  TROPONINI <0.30 <0.30   Lipid  Panel     Component Value Date/Time   CHOL 119 05/13/2012 0000   TRIG 76 05/13/2012 0000   HDL 40 05/13/2012 0000   CHOLHDL 3.0 05/13/2012 0000   VLDL 15 05/13/2012 0000   LDLCALC 64 05/13/2012 0000    HgbA1C  Lab Results  Component Value Date   HGBA1C 6.3* 05/13/2012   Urine Drug Screen:   No results found for this basename: labopia,  cocainscrnur,  labbenz,  amphetmu,  thcu,  labbarb    Alcohol Level: No results found for this basename: ETH:2 in the last 168 hours  CT Head  05/12/2012   No intracranial hemorrhage following TPA therapy.  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  No new abnormalities.   05/12/2012   1.  Evolving left MCA territory infarct involving the left insular cortex and frontal operculum without evidence for hemorrhagic conversion. 2.  The study is mildly degraded by patient motion.   05/12/2012   No evidence of acute intracranial abnormality.     Cerebral Angio Lt common carotid arteriogram followed by endovascular complete revascularization of oclluded Lt MCA with x1 pass of TREVO PRO vue Stent retriever device ,and stent assisted angioplasty of symptomatic pre occlusive Lt ICA Stenosis .  MRI of the brain  05/13/2012 Moderate to large sized left hemispheric infarct possibly with minimal hemorrhage at the opercular level. Small acute infarct right occipital lobe.    MRA of the brain 05/13/2012  Motion degraded exam.  Grading stenosis or evaluating for aneurysm is limited.   2D Echocardiogram EF 50-55% with no source of embolus.   Carotid Doppler see angio  CXR 05/12/2012 Endotracheal tube and nasogastric tube in appropriate position.  No active lung disease.    EKG  Supraventricular tachycardia. T wave abnormality, consider inferior ischemia  Therapy Recommendations OP PT, OT and language  therapy  Physical Exam  Middle aged Caucasian male intubated.Awake alert. Afebrile. Head is nontraumatic. Neck is  supple without bruit. Hearing is normal. Cardiac exam no murmur or gallop. Lungs are clear to auscultation. Distal pulses are well felt. Bilateral groin arterial sheaths present Neurological Exam : awake alert and significant expressive greater than receptive aphasia. Mumbles and speaks only occasional words.he will follow only few midline commands. Mild left gaze preference but can look to the right. Does not blink to threat on the right but does so on the left.Mild right lower facial weakness. No right upper or lower extremity drift but diminished fine finger movements on the right. Sensation, coordination and gait cannot be reliably tested. Right plantar is equivocal left is downgoing.Able to walk without assistance.  ASSESSMENT [name] is a 66 y.o. 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 Treated with IV t-PA at [facility] and transferred to [facility] where he received rescue PTA/stenting of proximal LICA stenosis and mechanical embolectomy of the left M1 using Trevo stent retriever. Patient with resultant complete recanalization of Lt MCA. On aspirin prior to admission. Now on Aspirin and plavix via tube given recent stent and for secondary stroke prevention. Patient with resultant expressive aphasia, dysarthria and rt hemiparesis. Too high level for inpatient rehab. Needs OP therapies.   cigarette smoker, 2 PPD, placed on nicotine patch  COPD Hypertension Hyperlipidemia, LDL 64, on statin PTA, goal LDL < 100 Depression HgbA1c 6.3 Dysphagia w/ NG and tube feedings, free water  Hospital day # 4  TREATMENT/PLAN  Continue aspirin and plavix for secondary stroke prevention  ST following swallow, plan reassessment pending pt progress Plan home with wife w/ OP therapies once swallowing or PEG placed. Will place PEG Monday if unable to swallow. D/c IV, IVF   Annie Main, MSN, RN,  ANVP-BC, ANP-BC, GNP-BC [facility] Pager: [phone] 05/15/2012 10:16 AM  Scribe for Dr. Delia Heady, Stroke Center Medical Director, who has personally reviewed chart, pertinent data, examined the patient and developed the plan of care. Pager:  [phone]

## 2012-05-15 NOTE — Progress Notes (Signed)
Physical Therapy Treatment Patient Details Name: Paul Bass MRN: [id number] DOB: 02/16/1946 Today's Date: 05/15/2012 Time: 5784-6962 PT Time Calculation (min): 40 min  PT Assessment / Plan / Recommendation Comments on Treatment Session  Patient is extremely motivated. Progress well today with following directions and task. Had patient stop at pictures in hallway and word find for items that were in the pictures. When patient asked what color the OTs scrubs were patient able to state, "She is wearing purple". Patient was smiling and saying "yay" when he was able to speak the appropriate words. Patients wife present throughout and very encouraging with patient    Follow Up Recommendations  Outpatient PT;Supervision for mobility/OOB     Does the patient have the potential to tolerate intense rehabilitation     Barriers to Discharge        Equipment Recommendations  None recommended by OT;None recommended by PT    Recommendations for Other Services Rehab consult  Frequency Min 4X/week   Plan Discharge plan remains appropriate;Frequency remains appropriate    Precautions / Restrictions Precautions Precautions: Fall Restrictions Weight Bearing Restrictions: No   Pertinent Vitals/Pain     Mobility  Bed Mobility Supine to Sit: 5: Supervision Transfers Sit to Stand: 4: Min guard;With upper extremity assist;From chair/3-in-1;Without upper extremity assist Stand to Sit: To bed;To chair/3-in-1;With upper extremity assist;4: Min guard;Without upper extremity assist Details for Transfer Assistance: Patient stand x5 with and without use of UEs. Cues for positioning Ambulation/Gait Ambulation/Gait Assistance: 4: Min assist Ambulation Distance (Feet): 400 Feet Assistive device: None Ambulation/Gait Assistance Details: Patient practiced some dynamic gait activities with Min A for balance. Patient with less right drift this session Gait Pattern: Step-through pattern;Decreased stride  length Modified Rankin (Stroke Patients Only) Pre-Morbid Rankin Score: No symptoms Modified Rankin: Moderately severe disability    Exercises     PT Diagnosis:    PT Problem List:   PT Treatment Interventions:     PT Goals Acute Rehab PT Goals PT Goal: Sit to Stand - Progress: Progressing toward goal PT Transfer Goal: Bed to Chair/Chair to Bed - Progress: Progressing toward goal PT Goal: Ambulate - Progress: Progressing toward goal  Visit Information  Last PT Received On: 05/15/12 Assistance Needed: +1    Subjective Data      Cognition  Overall Cognitive Status: Appears within functional limits for tasks assessed/performed Arousal/Alertness: Awake/alert Orientation Level: Appears intact for tasks assessed Behavior During Session: [facility] for tasks performed Cognition - Other Comments: pt has language deficits.  Able to follow visual commands.      Balance     End of Session PT - End of Session Equipment Utilized During Treatment: Gait belt Activity Tolerance: Patient tolerated treatment well Patient left: in chair;with call bell/phone within reach Nurse Communication: Mobility status   GP     Fredrich Birks 05/15/2012, 3:11 PM 05/15/2012 Fredrich Birks PTA [phone] pager [phone] office

## 2012-05-15 NOTE — Progress Notes (Signed)
Brief Nutrition Follow Up  Spoke with RN, pt tolerating TF well. TF advancing to goal. Pt currently working with therapy.  Will continue to monitor.  Kendell Bane RD, LDN, CNSC [phone] Pager [phone] After Hours Pager

## 2012-05-15 NOTE — Progress Notes (Signed)
Speech Language Pathology Treatment Patient Details Name: Paul Bass MRN: [id number] DOB: [birth year] Today's Date: 05/15/2012 Time: 0865-7846 SLP Time Calculation (min): 40 min  Assessment / Plan / Recommendation Clinical Impression  Pt again showing significant improvement in motor planning, aphasia. With only moderate verbal and visual cues pt said name, answered basic Yes no question (gestures and verbal) with 80% accuracy, read dates on the calendar, stated basic orientation x3, counted to 10, repeated short sentences. Pt with noticable moderate dysarthria. Now with greater attempts pt shows that deficits are due to motor planning primarily though mild comprehension deficits persist. Pt following basic commands well which improves safety with PO trials. Pt consumed 10 ice chip trials after oral care with 3 effortful swallows each and followed by a cued throat clear/cough with 80% accuracy. At this time the plan of care is to continue trials of ice chips with SLP and with RN, repeat objective test (MBS) on Sunday. If pt does not initiate diet at that time, MD will plan for PEG on Monday. Pt/family aware.     SLP Plan  Continue with current plan of care    Pertinent Vitals/Pain NA  SLP Goals  SLP Goals Potential to Achieve Goals: Fair Progress/Goals/Alternative treatment plan discussed with pt/caregiver and they: Agree SLP Goal #1: Pt will verbalize during automatic speech tasks with max contextual cues x1.  SLP Goal #1 - Progress: Progressing toward goal SLP Goal #2: Pt will nod yes/no to basic biographical questions with max assist with 80% accuracy.  SLP Goal #2 - Progress: Progressing toward goal SLP Goal #3: Pt will complete basic functional task x1 with max contextual cues SLP Goal #3 - Progress: Met  General Temperature Spikes Noted: No Respiratory Status: Supplemental O2 delivered via (comment) Behavior/Cognition: Alert;Cooperative;Pleasant mood;Requires cueing Oral Cavity  - Dentition: Dentures, bottom;Dentures, top Patient Positioning: Upright in chair  Oral Cavity - Oral Hygiene Patient is HIGH RISK - Oral Care Protocol followed (see row info): Yes   Treatment Treatment focused on: Aphasia;Apraxia;Patient/family/caregiver education;Other (comment) (dysphagia) Family/Caregiver Educated: wife   GO     Claudine Mouton 05/15/2012, 9:42 AM

## 2012-05-16 LAB — GLUCOSE, CAPILLARY
Glucose-Capillary: 144 mg/dL — ABNORMAL HIGH (ref 70–99)
Glucose-Capillary: 155 mg/dL — ABNORMAL HIGH (ref 70–99)
Glucose-Capillary: 114 mg/dL — ABNORMAL HIGH (ref 70–99)

## 2012-05-16 MED ORDER — CLOPIDOGREL BISULFATE 75 MG PO TABS: 75.0000 mg | ORAL_TABLET | Freq: Every day | ORAL | Status: DC

## 2012-05-16 MED ORDER — FREE WATER: 200.0000 mL | Freq: Four times a day (QID) | Status: DC

## 2012-05-16 MED ORDER — JEVITY 1.2 CAL PO LIQD: 1000.0000 mL | ORAL | Status: DC

## 2012-05-16 MED ORDER — ASPIRIN 81 MG PO CHEW: 81.0000 mg | CHEWABLE_TABLET | Freq: Every day | ORAL | Status: DC

## 2012-05-16 MED ORDER — ALBUTEROL SULFATE (5 MG/ML) 0.5% IN NEBU
2.5000 mg | INHALATION_SOLUTION | Freq: Three times a day (TID) | RESPIRATORY_TRACT | Status: DC
  Administered 2012-05-16 – 2012-05-17 (×4): 2.5 mg via RESPIRATORY_TRACT
  Filled 2012-05-16 (×4): qty 0.5

## 2012-05-16 MED ORDER — NICOTINE 21 MG/24HR TD PT24: 1.0000 | MEDICATED_PATCH | Freq: Every day | TRANSDERMAL | Status: DC

## 2012-05-16 MED ORDER — IPRATROPIUM BROMIDE 0.02 % IN SOLN
0.5000 mg | Freq: Three times a day (TID) | RESPIRATORY_TRACT | Status: DC
  Administered 2012-05-16 – 2012-05-17 (×4): 0.5 mg via RESPIRATORY_TRACT
  Filled 2012-05-16 (×4): qty 2.5

## 2012-05-16 MED ORDER — CLOPIDOGREL BISULFATE 75 MG PO TABS
75.0000 mg | ORAL_TABLET | Freq: Every day | ORAL | Status: DC
  Administered 2012-05-16 – 2012-05-17 (×2): 75 mg
  Filled 2012-05-16 (×2): qty 1

## 2012-05-16 MED ORDER — PANTOPRAZOLE SODIUM 40 MG PO PACK
40.0000 mg | PACK | Freq: Every day | ORAL | Status: DC
  Administered 2012-05-16: 40 mg
  Filled 2012-05-16 (×2): qty 20

## 2012-05-16 MED ORDER — ASPIRIN 81 MG PO CHEW
81.0000 mg | CHEWABLE_TABLET | Freq: Every day | ORAL | Status: DC
  Administered 2012-05-16 – 2012-05-17 (×2): 81 mg
  Filled 2012-05-16 (×2): qty 1

## 2012-05-16 NOTE — Progress Notes (Signed)
Speech Language Pathology Treatment Patient Details Name: Paul Bass MRN: [id number] DOB: 01-16-1946 Today's Date: 05/16/2012 Time: 2130-8657 SLP Time Calculation (min): 45 min  Assessment / Plan / Recommendation Clinical Impression  Second treatment session today with wife presents to discuss SLP current assessment of function and POC regarding d/c to SNF with repeat OP MBS next week. Pts wife verbalizes understanding. Moderate verbal and visual cues provided for pt expression of wants/needs and ideas during card game. Pts speech improving rapidly. Phrase level conversation with semantic/phonemic paraphasias and jargoin with content words (names, places), SLp also provided further therepeutic trials of ice chips with verbal/tactile cues for effortful swallow. Pt should be seen next week. Will call [facility] SLP tomorrow to discuss.     SLP Plan  Continue with current plan of care    Pertinent Vitals/Pain NA  SLP Goals  SLP Goals Potential to Achieve Goals: Fair SLP Goal #1: Pt will verbalize during automatic speech tasks with max contextual cues x1.  SLP Goal #1 - Progress: Met SLP Goal #2: Pt will nod yes/no to basic biographical questions with max assist with 80% accuracy.  SLP Goal #2 - Progress: Met SLP Goal #3: Pt will complete basic functional task x1 with max contextual cues SLP Goal #3 - Progress: Met  General Temperature Spikes Noted: No Respiratory Status: Supplemental O2 delivered via (comment) Behavior/Cognition: Alert;Cooperative;Pleasant mood;Requires cueing Oral Cavity - Dentition: Dentures, bottom;Dentures, top Patient Positioning: Upright in bed  Oral Cavity - Oral Hygiene Patient is HIGH RISK - Oral Care Protocol followed (see row info): Yes   Treatment Treatment focused on: Aphasia;Apraxia;Patient/family/caregiver education Family/Caregiver Educated: wife Skilled Treatment: visual verbal, phonemic placement, automatic tasks (coutning), effortful swallow  therapeutic trials   GO    Harlon Ditty, [place] CCC-SLP [phone]  Claudine Mouton 05/16/2012, 3:56 PM

## 2012-05-16 NOTE — Progress Notes (Signed)
Nutrition Follow-up  Intervention:   Continue current TF Jevity 1.2 @ 80 ml/hr with 200 ml H2O flush QID.  Assessment:   Pt seems to be progressing. Pt able to answer some questions. Per RN plan for [facility] tomorrow with SLP. Patient has nasoenteric feeding tube in place. Jevity 1.2 is infusing @ 80 ml/hr. Tube feeding regimen currently providing 2304 kcal, 106 grams protein, and 1555 ml H2O.   Free water flushes: 200 ml every 6 hrs providing 800 of free water.  Total free water: 2355 ml per day.  Residuals: 0  Last bm: 05/15/12  Diet Order:  NPO  Meds: Scheduled Meds:   . albuterol  2.5 mg Nebulization TID  . antiseptic oral rinse  15 mL Mouth Rinse QID  . atorvastatin  20 mg Oral q1800  . budesonide-formoterol  2 puff Inhalation BID  . chlorhexidine  15 mL Mouth Rinse BID  . cholecalciferol  800 Units Oral Daily  . citalopram  20 mg Oral Daily  . clopidogrel  75 mg Oral Once  . enoxaparin (LOVENOX) injection  40 mg Subcutaneous Q24H  . free water  200 mL Per Tube Q6H  . insulin aspart  0-15 Units Subcutaneous Q4H  . ipratropium  0.5 mg Nebulization TID  . levothyroxine  150 mcg Oral QAC breakfast  . losartan  100 mg Oral Daily  . multivitamin with minerals  1 tablet Oral Daily  . nicotine  21 mg Transdermal Daily  . omega-3 acid ethyl esters  5 g Oral Daily  . pantoprazole (PROTONIX) IV  40 mg Intravenous QHS  . pneumococcal 23 valent vaccine  0.5 mL Intramuscular Tomorrow-1000  . Tamsulosin HCl  0.4 mg Oral Daily  . DISCONTD: albuterol  2.5 mg Nebulization Q4H  . DISCONTD: ipratropium  0.5 mg Nebulization Q4H   Continuous Infusions:   . feeding supplement (JEVITY 1.2 CAL) 1,000 mL (05/15/12 2214)   PRN Meds:.acetaminophen, acetaminophen, albuterol  Labs:  CMP     Component Value Date/Time   NA 141 05/13/2012 0000   K 3.9 05/13/2012 0000   CL 107 05/13/2012 0000   CO2 26 05/13/2012 0000   GLUCOSE 119* 05/13/2012 0000   BUN 10 05/13/2012 0000   CREATININE 0.83  05/13/2012 0000   CALCIUM 8.5 05/13/2012 0000   GFRNONAA 90* 05/13/2012 0000   GFRAA >90 05/13/2012 0000   CBG (last 3)   Basename 05/16/12 0833 05/16/12 0341 05/15/12 2354  GLUCAP 155* 144* 123*    Intake/Output Summary (Last 24 hours) at 05/16/12 1215 Last data filed at 05/16/12 0829  Gross per 24 hour  Intake    420 ml  Output      0 ml  Net    420 ml    Weight Status:  238 lb on admission 238 lb 10/17 238 lb 10/14 246 lb 10/13  Re-estimated needs:  2150 - 2350 kcal; 105-115 grams protein  Nutrition Dx:  Inadequate oral intake r/t inability to eat AEB NPO status; ongoing.  Goal: Pt to meet >/= 90% of their estimated nutrition needs; not met  Monitor:  Swallow, TF tolerance, I/O's   Kendell Bane RD, LDN, CNSC [phone] Pager [phone] After Hours Pager

## 2012-05-16 NOTE — Progress Notes (Addendum)
Clinical Social Work Department CLINICAL SOCIAL WORK PLACEMENT NOTE 05/16/2012  Patient:  Paul Bass, Paul Bass  Account Number:  [id number] Admit date:  05/11/2012  Clinical Social Worker:  Unk Lightning, LCSW  Date/time:  05/16/2012 12:00 N  Clinical Social Work is seeking post-discharge placement for this patient at the following level of care:   SKILLED NURSING   (*CSW will update this form in Epic as items are completed)   05/16/2012  Patient/family provided with [facility] Department of Clinical Social Work's list of facilities offering this level of care within the geographic area requested by the patient (or if unable, by the patient's family).  05/16/2012  Patient/family informed of their freedom to choose among providers that offer the needed level of care, that participate in Medicare, Medicaid or managed care program needed by the patient, have an available bed and are willing to accept the patient.  05/16/2012  Patient/family informed of [facility]' ownership interest in [facility], as well as of the fact that they are under no obligation to receive care at this facility.  PASARR submitted to EDS on 05/16/2012 PASARR number received from EDS on 05/16/2012  FL2 transmitted to all facilities in geographic area requested by pt/family on  05/16/2012 FL2 transmitted to all facilities within larger geographic area on   Patient informed that his/her managed care company has contracts with or will negotiate with  certain facilities, including the following:     Patient/family informed of bed offers received:  05/16/12 Patient chooses bed at [facility] Physician recommends and patient chooses bed at    [facility]  05/[facility]/12 Patient to be transferred to facility by [facility]  The following physician request were entered in Epic:   Additional Comments:

## 2012-05-16 NOTE — Progress Notes (Signed)
Clinical Social Work  CSW met with patient and wife to provide bed offers. Patient and wife agreeable to [facility]. CSW spoke with SNF who is agreeable to admission but needs insurance authorization. CSW followed up with insurance company who reported that authorization would not be completed today and to follow up tomorrow. CSW informed RN, Dr. Jacky Kindle, patient and wife of barrier with insurance. CSW will follow up with insurance company and will assist with dc. CSW will continue to follow.  [place], [place] [phone]

## 2012-05-16 NOTE — Progress Notes (Signed)
Speech Language Pathology Dysphagia Treatment Patient Details Name: Paul Bass MRN: [id number] DOB: August 26, 1945 Today's Date: 05/16/2012 Time: 1215-1230 SLP Time Calculation (min): 15 min  Assessment / Plan / Recommendation Clinical Impression  Treatment session focused on subjective diagnostic therapy to determine readiness for NG removal and objective testing prior to d/c. SLP provided trials of nectar thick liquids via teaspoon (pt self fed) with difficult initiation of swallow response, multiple swallows, and delayed throat clearing and cough with wet vocal quality. Presentation consistent with finding on initial FEES indicating weakness and a build up of pharyngeal residuals with dleayed sensation of aspriation.   Pt is not yet ready to initiate POs given high aspiration risk and struggle to initaite motor movements such as volitional throat clear and cough (though rapidly improving). Suggest NG tube remain for alternate nutrition, pt may d/c to SNF with return on 05/13/12 for outpatient MBS. Recommend pt continue to receive trials of  minimal ice chips following oral care only. Will discuss plan with wife. Pt would still continue to benefit from outpatient SLP for long term treatment.     Diet Recommendation  Continue with Current Diet: NPO    SLP Plan Continue with current plan of care   Pertinent Vitals/Pain NA   Swallowing Goals  SLP Swallowing Goals Goal #3: Pt will consume trials of pureed solids or nectar thick liquids with multiple effortful swallow with max contextual cues.  Swallow Study Goal #3 - Progress: Progressing toward goal Goal #4: Pt will consume trials of nectar thick liquids with reduced signs of aspiration with min contextual cues prior to repeat MBS.  Swallow Study Goal #4 - Progress: Progressing toward goal  General Temperature Spikes Noted: No Respiratory Status: Supplemental O2 delivered via (comment) Behavior/Cognition: Alert;Cooperative;Pleasant  mood;Requires cueing Oral Cavity - Dentition: Dentures, bottom;Dentures, top Patient Positioning: Upright in bed  Oral Cavity - Oral Hygiene Patient is HIGH RISK - Oral Care Protocol followed (see row info): Yes   Dysphagia Treatment Treatment focused on: Upgraded PO texture trials;Patient/family/caregiver education Treatment Methods/Modalities: Skilled observation;Differential diagnosis Patient observed directly with PO's: Yes Type of PO's observed: Nectar-thick liquids Liquids provided via: Teaspoon Oral Phase Signs & Symptoms: Other (comment) (masticating liquids) Pharyngeal Phase Signs & Symptoms: Suspected delayed swallow initiation;Multiple swallows;Wet vocal quality;Delayed cough Type of cueing: Verbal Amount of cueing: Minimal   GO     Paul Bass, Paul Bass 05/16/2012, 2:39 PM

## 2012-05-16 NOTE — Progress Notes (Addendum)
Clinical Social Work Department BRIEF PSYCHOSOCIAL ASSESSMENT 05/16/2012  Patient:  Paul Bass, Paul Bass     Account Number:  [id number]     Admit date:  05/11/2012  Clinical Social Worker:  Dennison Bulla  Date/Time:  05/16/2012 11:45 AM  Referred by:  Physician  Date Referred:  05/16/2012 Referred for  SNF Placement   Other Referral:   Interview type:  Patient Other interview type:   Wife    PSYCHOSOCIAL DATA Living Status:  FAMILY Admitted from facility:   Level of care:   Primary support name:  Bonita Quin Primary support relationship to patient:  SPOUSE Degree of support available:   Strong    CURRENT CONCERNS Current Concerns  Post-Acute Placement   Other Concerns:    SOCIAL WORK ASSESSMENT / PLAN CSW received referral to assist with SNF placement. CSW reviewed chart and met with patient and wife at bedside. Patient agreeable to wife being involved in assessment.    CSW introduced myself and explained role. CSW explained the need for SNF at dc. Wife became very upset and reported this was not the plan discussed with MD. Wife desires to speak with MD. RN agreed to page MD for wife.    CSW and patient discussed the benefits of SNF along with what therapies were offered. Wife does not want patient to go to "nursing home" and feels patient would not be properly cared for. CSW explained SNF and that patient needs ST stay. Patient and wife agreeable to search but prefers [place]. CSW explained need to expand search in case SNFs in [place] was unable to offer a bed. CSW submitted clinicals to [facility] for authorization.    CSW left SNF list with patient and wife to review. CSW completed [place] and [facility]. CSW will follow up with bed offers.   Assessment/plan status:  Psychosocial Support/Ongoing Assessment of Needs Other assessment/ plan:   Information/referral to community resources:   SNF list    PATIENT'S/FAMILY'S RESPONSE TO PLAN OF  CARE: Patient alert and oriented but minimally engaged. Wife is upset and feels plans have not been discussed with her. Agreeable to SNF placement but does not feel this is the best plan.

## 2012-05-16 NOTE — Discharge Summary (Signed)
Stroke Discharge Summary  Patient ID: Paul Bass   MRN: [id number]      DOB: 03-01-1946  Date of Admission: 05/11/2012 Date of Discharge: 05/16/2012  Attending Physician:  Darcella Cheshire, MD, Stroke MD  Consulting Physician(s):     pulmonary/intensive care -Dr Delton Coombes Patient's PCP:  Coralyn Pear, NP  Discharge Diagnoses:  Principal Problem:  *Stroke, acute, thrombotic Active Problems:  Symptomatic carotid artery stenosis  HTN (hypertension)  COPD (chronic obstructive pulmonary disease)  Acute respiratory failure  Dysphagia  Global aphasia  BMI: Body mass index is 32.32 kg/(m^2).  Past Medical History  Diagnosis Date  . HTN (hypertension)   . CAD S/P percutaneous coronary angioplasty   . Hyperlipidemia   . COPD (chronic obstructive pulmonary disease)   . Tobacco abuse   . Hypothyroidism   . Depression   . Anxiety   . BPH (benign prostatic hypertrophy)    Past Surgical History  Procedure Date  . 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 Rotator cuff replaced     right  . Pins r lower leg     LABORATORY STUDIES CBC    Component Value Date/Time   WBC 9.2 05/13/2012 0000   RBC 3.93* 05/13/2012 0000   HGB 13.2 05/13/2012 0000   HCT 38.4* 05/13/2012 0000   PLT 117* 05/13/2012 0000   MCV 97.7 05/13/2012 0000   MCH 33.6 05/13/2012 0000   MCHC 34.4 05/13/2012 0000   RDW 13.4 05/13/2012 0000   LYMPHSABS 0.7 05/13/2012 0000   MONOABS 0.8 05/13/2012 0000   EOSABS 0.1 05/13/2012 0000   BASOSABS 0.0 05/13/2012 0000   CMP    Component Value Date/Time   NA 141 05/13/2012 0000   K 3.9 05/13/2012 0000   CL 107 05/13/2012 0000   CO2 26 05/13/2012 0000   GLUCOSE 119* 05/13/2012 0000   BUN 10 05/13/2012 0000   CREATININE 0.83 05/13/2012 0000   CALCIUM 8.5 05/13/2012 0000   GFRNONAA 90* 05/13/2012 0000   GFRAA >90 05/13/2012 0000   Lipid Panel    Component Value Date/Time   CHOL 119 05/13/2012 0000   TRIG 76  05/13/2012 0000   HDL 40 05/13/2012 0000   CHOLHDL 3.0 05/13/2012 0000   VLDL 15 05/13/2012 0000   LDLCALC 64 05/13/2012 0000   HgbA1C  Lab Results  Component Value Date   HGBA1C 6.3* 05/13/2012    SIGNIFICANT DIAGNOSTIC STUDIES  CT Head  05/12/2012 No intracranial hemorrhage following TPA therapy. 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 No new abnormalities.  05/12/2012 1. Evolving left MCA territory infarct involving the left insular cortex and frontal operculum without evidence for hemorrhagic conversion. 2. The study is mildly degraded by patient motion.  05/12/2012 No evidence of acute intracranial abnormality.   Cerebral Angio Lt common carotid arteriogram followed by endovascular complete revascularization of oclluded Lt MCA with x1 pass of TREVO PRO vue Stent retriever device ,and stent assisted angioplasty of symptomatic pre occlusive Lt ICA Stenosis .   MRI of the brain 05/13/2012 Moderate to large sized left hemispheric infarct possibly with minimal hemorrhage at the opercular level. Small acute infarct right occipital lobe.   MRA of the brain 05/13/2012 Motion degraded exam. Grading stenosis or evaluating for aneurysm is limited.   2D Echocardiogram EF 50-55% with no source of embolus.   Carotid Doppler see angio   CXR 05/12/2012  Endotracheal tube and nasogastric tube in appropriate position. No active lung disease.   EKG Supraventricular tachycardia. T wave abnormality, consider inferior ischemia    History of Present Illness   Paul Bass is an 66 y.o. male history of hypertension, hyperlipidemia, coronary artery disease and COPD, who developed acute onset of receptive and expressive aphasia as well as right hemiplegia at about 6:10 PM on 05/11/2012. There is no previous history of stroke nor TIA. Patient has been taking aspirin daily. He presented initially to the emergency room at [facility]. CT  scan of his head showed hyperdensity of the left middle cerebral artery, indicative of probable thrombus. NIH stroke score was 17. Laboratory studies were unremarkable. He was given intravenous thrombolytic therapy with TPA. There was a return of movement of his right upper and lower extremities. Profound aphasia was persistent, however. Patient was subsequently transferred to [facility] for further management, including interventional radiology evaluation and treatment for probable left MCA occlusion. Patient was taken to the angiogram suite on arrival. Carotid angiogram showed occlusion left middle cerebral artery proximally, as well as moderately severe stenosis of proximal left ICA. Revascularization of the left MCA was achieved using Trevo stent retreiver x 1 attempt, and Angioplasty and stenting of left proximal ICA was performed. Patient was subsequently transferred to neuro intensive care unit for further management  Hospital Course  [name] is a 66 y.o. male presenting with Aphasia and right hemiplegia from Lt MCA occlusion likely thromboembolism from high-grade proximal left ICA stenosis. Treated with IV t-PA at [facility] and transferred to [facility] where he received rescue PTA/stenting of proximal LICA stenosis and mechanical embolectomy of the left M1 using Trevo stent retriever. Patient with resultant complete recanalization of Lt MCA. On aspirin prior to admission. Now on Aspirin and plavix via tube given recent stent and for secondary stroke prevention. Patient with resultant expressive aphasia, dysarthria and rt hemiparesis. Too high level for inpatient rehab. Needs OP therapies.  He does have dysphagia w/ NG and tube feedings, free water. No significant improvement in tongue movements overnight. He will need tube feeds with a decision made on Monday whether or not he will need a PEG tube placed. In the meantime, it is felt safe for him to be transferred to [facility] for  short term to receive treatment/therappies, and then to return for outpt swallow/MBSS at [facility] on Monday. Plans are hopeful that he will return home with wife when he is able to swallow. Plan was discussed between Dr. Jacky Kindle, Dr. Pearlean Brownie and the patient/family.   Patient has stroke risk factors of diabetes mellitus, hyperlipidemia, hypertension and smoking. Tobacco cessation counselling.  Patient with continued stroke symptoms of aphasia, right hemiplegia, dysphagia requiring NG, --physical therapy, occupational therapy and speech therapy evaluated patient. They recommend SNF for hopefully short term with NGT.  Discharge Exam  Blood pressure 168/74, pulse 51, temperature 98.7 F (37.1 C), temperature source Oral, resp. rate 20, height 6' (1.829 m), weight 108.1 kg (238 lb 5.1 oz), SpO2 96.00%.    Physical Exam Middle aged Caucasian male intubated.Awake alert. Afebrile. Head is nontraumatic. Neck is supple without bruit. Hearing is normal. Cardiac exam no murmur or gallop. Lungs are clear to auscultation. Distal pulses are well felt. Bilateral groin arterial sheaths present  Neurological Exam : awake alert and significant expressive greater than receptive aphasia. Mumbles and speaks only occasional words.he will follow only few midline commands. Mild left gaze preference but can look to  the right. Does not blink to threat on the right but does so on the left.Mild right lower facial weakness. No right upper or lower extremity drift but diminished fine finger movements on the right. Sensation, coordination and gait cannot be reliably tested. Right plantar is equivocal left is downgoing.Able to walk without assistance.  Discharge Diet   NPO , Jevity at 97ml/hr  Discharge Plan    Disposition:  [facility] for ongoing PT, OT and ST  Modified Barium swallow study at [facility] on Monday. Order placed in EPIC  NG and tube feeding care  Aspirin 81 mg and Plavix 75 mg daily for  secondary stroke prevention  Ongoing risk factor control by Primary Care Physician. Risk factor recommendations:  Hypertension target range 130-140/70-80 Lipid range - LDL < 100 and checked every 6 months, fasting Diabetes - HgB A1C <7 Smoking cessation   Follow-up Glass, Maureen Chatters, NP in 1 month.  Follow-up with Dr. Delia Heady in 2 months.  Signed  Guy Franco, PAC,  MBA, MHA [facility] Pager: [phone] 05/16/2012 3:24 PM  Scribe for Dr. Delia Heady, Stroke Center Medical Director. He has personally reviewed chart, pertinent data, examined the patient and developed the plan of care. Pager:  [phone]

## 2012-05-16 NOTE — Progress Notes (Signed)
Stroke Team Progress Note  HISTORY CARLISS QUAST is an 66 y.o. male history of hypertension, hyperlipidemia, coronary artery disease and COPD, who developed acute onset of receptive and expressive aphasia as well as right hemiplegia at about 6:10 PM on 05/11/2012. There is no previous history of stroke nor TIA. Patient has been taking aspirin daily. He presented initially to the emergency room at [facility]. CT scan of his head showed hyperdensity of the left middle cerebral artery, indicative of probable thrombus. NIH stroke score was 17. Laboratory studies were unremarkable. He was given intravenous thrombolytic therapy with TPA. There was a return of movement of his right upper and lower extremities. Profound aphasia was persistent, however. Patient was subsequently transferred to [facility] for further management, including interventional radiology evaluation and treatment for probable left MCA occlusion. Patient was taken to the angiogram suite on arrival. Carotid angiogram showed occlusion left middle cerebral artery proximally, as well as moderately severe stenosis of proximal left ICA. Revascularization of the left MCA was achieved using Trevo stent retreiver x 1 attempt, and  Angioplasty and stenting of left proximal ICA was performed. Patient was subsequently transferred to neuro intensive care unit for further management.  SUBJECTIVE Patient without new complaints. No significant change in status overnight. Wife not present at bedside. Left message for her at home that Dr. Pearlean Brownie wanted to speak with her.  OBJECTIVE Most recent Vital Signs: Filed Vitals:   05/16/12 0200 05/16/12 0500 05/16/12 0829 05/16/12 0955  BP: 174/84 167/55  157/77  Pulse: 55 49  56  Temp: 98.5 F (36.9 C) 98.8 F (37.1 C)  98.2 F (36.8 C)  TempSrc: Oral Oral  Oral  Resp: 20 20  20   Height:      Weight:  108.1 kg (238 lb 5.1 oz)    SpO2: 97% 97% 98% 98%   CBG (last 3)    Basename 05/16/12 0833 05/16/12 0341 05/15/12 2354  GLUCAP 155* 144* 123*    IV Fluid Intake:      . feeding supplement (JEVITY 1.2 CAL) 1,000 mL (05/15/12 2214)    MEDICATIONS     . albuterol  2.5 mg Nebulization TID  . antiseptic oral rinse  15 mL Mouth Rinse QID  . atorvastatin  20 mg Oral q1800  . budesonide-formoterol  2 puff Inhalation BID  . chlorhexidine  15 mL Mouth Rinse BID  . cholecalciferol  800 Units Oral Daily  . citalopram  20 mg Oral Daily  . clopidogrel  75 mg Oral Once  . enoxaparin (LOVENOX) injection  40 mg Subcutaneous Q24H  . free water  200 mL Per Tube Q6H  . insulin aspart  0-15 Units Subcutaneous Q4H  . ipratropium  0.5 mg Nebulization TID  . levothyroxine  150 mcg Oral QAC breakfast  . losartan  100 mg Oral Daily  . multivitamin with minerals  1 tablet Oral Daily  . nicotine  21 mg Transdermal Daily  . omega-3 acid ethyl esters  5 g Oral Daily  . pantoprazole (PROTONIX) IV  40 mg Intravenous QHS  . pneumococcal 23 valent vaccine  0.5 mL Intramuscular Tomorrow-1000  . Tamsulosin HCl  0.4 mg Oral Daily  . DISCONTD: albuterol  2.5 mg Nebulization Q4H  . DISCONTD: ipratropium  0.5 mg Nebulization Q4H   PRN:  acetaminophen, acetaminophen, albuterol  Diet:  NPO  Activity:  OOB DVT Prophylaxis:  Lovenox 40 mg sq daily   CLINICALLY SIGNIFICANT STUDIES Basic Metabolic Panel:  Lab 05/13/12  NA 141  K 3.9  CL 107  CO2 26  GLUCOSE 119*  BUN 10  CREATININE 0.83  CALCIUM 8.5  MG --  PHOS --   Liver Function Tests: No results found for this basename: AST:2,ALT:2,ALKPHOS:2,BILITOT:2,PROT:2,ALBUMIN:2 in the last 168 hours CBC:   Lab 05/13/12  WBC 9.2  NEUTROABS 7.6  HGB 13.2  HCT 38.4*  MCV 97.7  PLT 117*   Cardiac Enzymes:   Lab 05/12/12 1400 05/12/12 0820  CKTOTAL -- --  CKMB -- --  CKMBINDEX -- --  TROPONINI <0.30 <0.30   Lipid Panel     Component Value Date/Time   CHOL 119 05/13/2012 0000   TRIG 76 05/13/2012 0000    HDL 40 05/13/2012 0000   CHOLHDL 3.0 05/13/2012 0000   VLDL 15 05/13/2012 0000   LDLCALC 64 05/13/2012 0000    HgbA1C  Lab Results  Component Value Date   HGBA1C 6.3* 05/13/2012   Urine Drug Screen:   No results found for this basename: labopia,  cocainscrnur,  labbenz,  amphetmu,  thcu,  labbarb    Alcohol Level: No results found for this basename: ETH:2 in the last 168 hours  CT Head  05/12/2012   No intracranial hemorrhage following TPA therapy.  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  No new abnormalities.   05/12/2012   1.  Evolving left MCA territory infarct involving the left insular cortex and frontal operculum without evidence for hemorrhagic conversion. 2.  The study is mildly degraded by patient motion.   05/12/2012   No evidence of acute intracranial abnormality.     Cerebral Angio Lt common carotid arteriogram followed by endovascular complete revascularization of oclluded Lt MCA with x1 pass of TREVO PRO vue Stent retriever device ,and stent assisted angioplasty of symptomatic pre occlusive Lt ICA Stenosis .  MRI of the brain  05/13/2012 Moderate to large sized left hemispheric infarct possibly with minimal hemorrhage at the opercular level. Small acute infarct right occipital lobe.    MRA of the brain 05/13/2012  Motion degraded exam.  Grading stenosis or evaluating for aneurysm is limited.   2D Echocardiogram EF 50-55% with no source of embolus.   Carotid Doppler see angio  CXR 05/12/2012 Endotracheal tube and nasogastric tube in appropriate position.  No active lung disease.    EKG  Supraventricular tachycardia. T wave abnormality, consider inferior ischemia  Therapy Recommendations OP PT, OT and language  therapy  Physical Exam  Middle aged Caucasian male intubated.Awake alert. Afebrile. Head is nontraumatic. Neck is supple without bruit. Hearing is normal. Cardiac exam no murmur or gallop. Lungs are clear to  auscultation. Distal pulses are well felt. Bilateral groin arterial sheaths present Neurological Exam : awake alert and significant expressive greater than receptive aphasia. Mumbles and speaks only occasional words.he will follow only few midline commands. Mild left gaze preference but can look to the right. Does not blink to threat on the right but does so on the left.Mild right lower facial weakness. No right upper or lower extremity drift but diminished fine finger movements on the right. Sensation, coordination and gait cannot be reliably tested. Right plantar is equivocal left is downgoing.Able to walk without assistance.  ASSESSMENT Mr. SAMARION COXWELL is a 66 y.o. 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 Treated with IV t-PA at [facility] and transferred to [facility] where  he received rescue PTA/stenting of proximal LICA stenosis and mechanical embolectomy of the left M1 using Trevo stent retriever. Patient with resultant complete recanalization of Lt MCA. On aspirin prior to admission. Now on Aspirin and plavix via tube given recent stent and for secondary stroke prevention. Patient with resultant expressive aphasia, dysarthria and rt hemiparesis. Too high level for inpatient rehab. Needs OP therapies.   cigarette smoker, 2 PPD, placed on nicotine patch  COPD Hypertension Hyperlipidemia, LDL 64, on statin PTA, goal LDL < 100 Depression HgbA1c 6.3 Dysphagia w/ NG and tube feedings, free water. No significant improvement in tongue movements overnight.  Per Dr. Jacky Kindle, Care Management, patient does not meet inpatient criteria and can be discharged to [facility] today for ongoing  Hospital day # 5  TREATMENT/PLAN  Continue aspirin and plavix for secondary stroke prevention  Continue NG and tube feedings  Plan swallow reassessment Monday with OP MBSS  Given inpatient criteria, feel  best discharge plan is to [facility] for short term in order to receive therapies and nursing care of feeding tube. Will schedule OP swallow/MBSS on Monday at [facility]. Once able to swallow, ok to go home with wife. Concerned with discharging home with a NG given patient's aphasia and concern for being unable to manageme the tube/feedings safely.  Have discussed plan with Case Manager. SW aware placement is being recommended. Left message for wife at home; MD would like to speak with her.  I spoke to wife about above plan over the phone this afternoon  Annie Main, MSN, RN, ANVP-BC, ANP-BC, [facility] Pager: [phone] 05/16/2012 10:57 AM  Scribe for Dr. Delia Heady, Stroke Center Medical Director, who has personally reviewed chart, pertinent data, examined the patient and developed the plan of care. Pager:  [phone]

## 2012-05-17 LAB — GLUCOSE, CAPILLARY
Glucose-Capillary: 146 mg/dL — ABNORMAL HIGH (ref 70–99)
Glucose-Capillary: 135 mg/dL — ABNORMAL HIGH (ref 70–99)

## 2012-05-17 MED ORDER — PANTOPRAZOLE SODIUM 40 MG PO PACK: 40.0000 mg | PACK | Freq: Every day | ORAL | Status: DC

## 2012-05-17 MED ORDER — PNEUMOCOCCAL VAC POLYVALENT 25 MCG/0.5ML IJ INJ: 0.5000 mL | INJECTION | INTRAMUSCULAR | Status: DC

## 2012-05-17 NOTE — Progress Notes (Signed)
05/17/2012 Milana Kidney DPT PAGER: [phone] OFFICE: [phone]

## 2012-05-17 NOTE — Progress Notes (Addendum)
Physical Therapy Treatment Patient Details Name: Paul Bass MRN: [id number] DOB: 04/21/1946 Today's Date: 05/17/2012 Time: 4098-1191 PT Time Calculation (min): 28 min  PT Assessment / Plan / Recommendation Comments on Treatment Session  Patient continues to be very motivated and progressing well with mobilty. Worked with patient on pathfinding and naming of objects in hallway. Patient talking throughout in full sentances. Patient scheduled for DC to SNF and back to [facility] on Monday for Speech study.     Follow Up Recommendations  Outpatient PT;Supervision for mobility/OOB     Does the patient have the potential to tolerate intense rehabilitation     Barriers to Discharge        Equipment Recommendations  None recommended by PT;None recommended by OT    Recommendations for Other Services    Frequency Min 4X/week   Plan Discharge plan remains appropriate;Frequency remains appropriate    Precautions / Restrictions Precautions Precautions: Fall   Pertinent Vitals/Pain     Mobility  Bed Mobility Supine to Sit: 6: Modified independent (Device/Increase time) Transfers Sit to Stand: 6: Modified independent (Device/Increase time) Stand to Sit: 6: Modified independent (Device/Increase time) Ambulation/Gait Ambulation/Gait Assistance: 5: Supervision Ambulation Distance (Feet): 1300 Feet Assistive device: None Ambulation/Gait Assistance Details: Patient ambulating really well with no LOB with turns or changes in direction or speed.  Gait Pattern: Step-through pattern Stairs: Yes Stairs Assistance: 4: Min guard Stair Management Technique: Step to pattern;Two rails Number of Stairs: 6  Modified Rankin (Stroke Patients Only) Pre-Morbid Rankin Score: No symptoms Modified Rankin: Moderately severe disability    Exercises     PT Diagnosis:    PT Problem List:   PT Treatment Interventions:     PT Goals Acute Rehab PT Goals PT Goal: Supine/Side to Sit - Progress: Met PT  Goal: Sit to Stand - Progress: Met PT Transfer Goal: Bed to Chair/Chair to Bed - Progress: Met PT Goal: Ambulate - Progress: Met Pt will Go Up / Down Stairs: 3-5 stairs;with supervision;with least restrictive assistive device PT Goal: Up/Down Stairs - Progress: Progressing toward goal  Visit Information  Last PT Received On: 05/17/12 Assistance Needed: +1    Subjective Data      Cognition  Overall Cognitive Status: Appears within functional limits for tasks assessed/performed Arousal/Alertness: Awake/alert Orientation Level: Appears intact for tasks assessed Behavior During Session: [facility] for tasks performed    Balance     End of Session PT - End of Session Equipment Utilized During Treatment: Gait belt Activity Tolerance: Patient tolerated treatment well Patient left: in bed;with call bell/phone within reach;with family/visitor present Nurse Communication: Mobility status   GP     Fredrich Birks 05/17/2012, 8:57 AM 05/17/2012 Fredrich Birks PTA [phone] pager [phone] office

## 2012-05-17 NOTE — Progress Notes (Signed)
Stroke Team Progress Note  HISTORY Paul Bass is an 66 y.o. male history of hypertension, hyperlipidemia, coronary artery disease and COPD, who developed acute onset of receptive and expressive aphasia as well as right hemiplegia at about 6:10 PM on 05/11/2012. There is no previous history of stroke nor TIA. Patient has been taking aspirin daily. He presented initially to the emergency room at [facility]. CT scan of his head showed hyperdensity of the left middle cerebral artery, indicative of probable thrombus. NIH stroke score was 17. Laboratory studies were unremarkable. He was given intravenous thrombolytic therapy with TPA. There was a return of movement of his right upper and lower extremities. Profound aphasia was persistent, however. Patient was subsequently transferred to [facility] for further management, including interventional radiology evaluation and treatment for probable left MCA occlusion. Patient was taken to the angiogram suite on arrival. Carotid angiogram showed occlusion left middle cerebral artery proximally, as well as moderately severe stenosis of proximal left ICA. Revascularization of the left MCA was achieved using Trevo stent retreiver x 1 attempt, and  Angioplasty and stenting of left proximal ICA was performed. Patient was subsequently transferred to neuro intensive care unit for further management.  SUBJECTIVE PA spoke with wife last night related to plans. Dr. Pearlean Brownie and Jasmine December and SW also spoke with wife today. Her multiple questions were answered.  OBJECTIVE Most recent Vital Signs: Filed Vitals:   05/17/12 0500 05/17/12 0549 05/17/12 0928 05/17/12 1000  BP:  163/65  167/67  Pulse:  77  50  Temp:  98.3 F (36.8 C)  98.3 F (36.8 C)  TempSrc:  Oral  Oral  Resp:  17  18  Height:      Weight: 108.2 kg (238 lb 8.6 oz)     SpO2:  100% 98% 98%   CBG (last 3)   Basename 05/17/12 0823 05/17/12 0427 05/17/12 0009  GLUCAP 135* 146*  144*    IV Fluid Intake:      . feeding supplement (JEVITY 1.2 CAL) 1,000 mL (05/15/12 2214)    MEDICATIONS     . albuterol  2.5 mg Nebulization TID  . antiseptic oral rinse  15 mL Mouth Rinse QID  . aspirin  81 mg Per Tube Daily  . atorvastatin  20 mg Oral q1800  . budesonide-formoterol  2 puff Inhalation BID  . chlorhexidine  15 mL Mouth Rinse BID  . cholecalciferol  800 Units Oral Daily  . citalopram  20 mg Oral Daily  . clopidogrel  75 mg Per Tube Q breakfast  . enoxaparin (LOVENOX) injection  40 mg Subcutaneous Q24H  . free water  200 mL Per Tube Q6H  . insulin aspart  0-15 Units Subcutaneous Q4H  . ipratropium  0.5 mg Nebulization TID  . levothyroxine  150 mcg Oral QAC breakfast  . losartan  100 mg Oral Daily  . multivitamin with minerals  1 tablet Oral Daily  . nicotine  21 mg Transdermal Daily  . omega-3 acid ethyl esters  5 g Oral Daily  . pantoprazole sodium  40 mg Per Tube QHS  . pneumococcal 23 valent vaccine  0.5 mL Intramuscular Tomorrow-1000  . Tamsulosin HCl  0.4 mg Oral Daily  . DISCONTD: clopidogrel  75 mg Oral Once  . DISCONTD: pantoprazole (PROTONIX) IV  40 mg Intravenous QHS   PRN:  acetaminophen, acetaminophen, albuterol  Diet:  NPO  Activity:  OOB DVT Prophylaxis:  Lovenox 40 mg sq daily   CLINICALLY SIGNIFICANT  STUDIES Basic Metabolic Panel:   Lab 05/13/12  NA 141  K 3.9  CL 107  CO2 26  GLUCOSE 119*  BUN 10  CREATININE 0.83  CALCIUM 8.5  MG --  PHOS --   Liver Function Tests: No results found for this basename: AST:2,ALT:2,ALKPHOS:2,BILITOT:2,PROT:2,ALBUMIN:2 in the last 168 hours CBC:   Lab 05/13/12  WBC 9.2  NEUTROABS 7.6  HGB 13.2  HCT 38.4*  MCV 97.7  PLT 117*   Cardiac Enzymes:   Lab 05/12/12 1400 05/12/12 0820  CKTOTAL -- --  CKMB -- --  CKMBINDEX -- --  TROPONINI <0.30 <0.30   Lipid Panel     Component Value Date/Time   CHOL 119 05/13/2012 0000   TRIG 76 05/13/2012 0000   HDL 40 05/13/2012 0000   CHOLHDL  3.0 05/13/2012 0000   VLDL 15 05/13/2012 0000   LDLCALC 64 05/13/2012 0000    HgbA1C  Lab Results  Component Value Date   HGBA1C 6.3* 05/13/2012   Urine Drug Screen:   No results found for this basename: labopia,  cocainscrnur,  labbenz,  amphetmu,  thcu,  labbarb    Alcohol Level: No results found for this basename: ETH:2 in the last 168 hours  CT Head  05/12/2012   No intracranial hemorrhage following TPA therapy.  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  No new abnormalities.   05/12/2012   1.  Evolving left MCA territory infarct involving the left insular cortex and frontal operculum without evidence for hemorrhagic conversion. 2.  The study is mildly degraded by patient motion.   05/12/2012   No evidence of acute intracranial abnormality.     Cerebral Angio Lt common carotid arteriogram followed by endovascular complete revascularization of oclluded Lt MCA with x1 pass of TREVO PRO vue Stent retriever device ,and stent assisted angioplasty of symptomatic pre occlusive Lt ICA Stenosis .  MRI of the brain  05/13/2012 Moderate to large sized left hemispheric infarct possibly with minimal hemorrhage at the opercular level. Small acute infarct right occipital lobe.    MRA of the brain 05/13/2012  Motion degraded exam.  Grading stenosis or evaluating for aneurysm is limited.   2D Echocardiogram EF 50-55% with no source of embolus.   Carotid Doppler see angio  CXR 05/12/2012 Endotracheal tube and nasogastric tube in appropriate position.  No active lung disease.    EKG  Supraventricular tachycardia. T wave abnormality, consider inferior ischemia  Therapy Recommendations OP PT, OT and language  therapy  Physical Exam  Middle aged Caucasian male intubated.Awake alert. Afebrile. Head is nontraumatic. Neck is supple without bruit. Hearing is normal. Cardiac exam no murmur or gallop. Lungs are clear to auscultation. Distal pulses are well  felt. Bilateral groin arterial sheaths present Neurological Exam : awake alert and significant expressive greater than receptive aphasia. Mumbles and speaks only occasional words.he will follow only few midline commands. Mild left gaze preference but can look to the right. Does not blink to threat on the right but does so on the left.Mild right lower facial weakness. No right upper or lower extremity drift but diminished fine finger movements on the right. Sensation, coordination and gait cannot be reliably tested. Right plantar is equivocal left is downgoing.Able to walk without assistance.  ASSESSMENT [name] is a 66 y.o. 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 Treated with [facility] at [facility] and transferred to [facility] where he received rescue PTA/stenting of proximal LICA stenosis and mechanical embolectomy of the left M1 using Trevo stent retriever. Patient with resultant complete recanalization of Lt MCA. On aspirin prior to admission. Now on Aspirin and plavix via tube given recent stent and for secondary stroke prevention. Patient with resultant expressive aphasia, dysarthria and rt hemiparesis. Too high level for inpatient rehab. Needs OP therapies.   cigarette smoker, 2 PPD, placed on nicotine patch  COPD Hypertension Hyperlipidemia, LDL 64, on statin PTA, goal LDL < 100 Depression HgbA1c 6.3 Dysphagia w/ NG and tube feedings, free water. No significant improvement in tongue movements overnight.  Per Dr. Jacky Kindle, Care Management, patient does not meet inpatient criteria and can be discharged to [facility]  for ongoing care  Hospital day # 6  TREATMENT/PLAN  Continue aspirin and plavix for secondary stroke prevention  Continue NG and tube feedings  Plan swallow reassessment Monday with OP MBSS  If unable to swallow on Monday, replace feeding tube and return to  [facility], schedule outpatient PEG placement. May desire transfer to [facility] facility should that occur, after PEG placement and stabilization, of course. If passes swallow, return to [facility] w/ staff working with wife for discharge plan - home w/ home health or OP therapies vs long-term placement. Have discussed plan with wife, SW   Willis, MSN, RN, ANVP-BC, ANP-BC, [facility] Pager: [phone] 05/17/2012 11:25 AM  Scribe for Dr. Delia Heady, Stroke Center Medical Director, who has personally reviewed chart, pertinent data, examined the patient and developed the plan of care. Pager:  [phone]

## 2012-05-17 NOTE — Progress Notes (Signed)
Clinical Social Work  CSW faxed dc summary to [facility] and SNF is agreeable to admission. CSW  Prepared dc packet. CSW informed patient, wife and RN of dc and all agreeable to dc. RN requests [facility]AR pick up at 1315. CSW coordinated transportation. CSW is signing off.  [place], [place] [phone] (Coverage for Dede Query)

## 2012-05-18 ENCOUNTER — Encounter (HOSPITAL_COMMUNITY): Payer: Self-pay | Admitting: Emergency Medicine

## 2012-05-18 ENCOUNTER — Emergency Department (HOSPITAL_COMMUNITY)
Admission: EM | Admit: 2012-05-18 | Discharge: 2012-05-18 | Disposition: A | Discharge status: Home / Self Care | Payer: Medicare Other | Attending: Emergency Medicine | Admitting: Emergency Medicine

## 2012-05-18 DIAGNOSIS — Z87821 Personal history of retained foreign body fully removed: Secondary | ICD-10-CM | POA: Insufficient documentation

## 2012-05-18 DIAGNOSIS — F329 Major depressive disorder, single episode, unspecified: Secondary | ICD-10-CM | POA: Insufficient documentation

## 2012-05-18 DIAGNOSIS — E785 Hyperlipidemia, unspecified: Secondary | ICD-10-CM | POA: Insufficient documentation

## 2012-05-18 DIAGNOSIS — J4489 Other specified chronic obstructive pulmonary disease: Secondary | ICD-10-CM | POA: Insufficient documentation

## 2012-05-18 DIAGNOSIS — Z880 Allergy status to penicillin: Secondary | ICD-10-CM | POA: Insufficient documentation

## 2012-05-18 DIAGNOSIS — N400 Enlarged prostate without lower urinary tract symptoms: Secondary | ICD-10-CM | POA: Insufficient documentation

## 2012-05-18 DIAGNOSIS — Z9861 Coronary angioplasty status: Secondary | ICD-10-CM | POA: Insufficient documentation

## 2012-05-18 DIAGNOSIS — J449 Chronic obstructive pulmonary disease, unspecified: Secondary | ICD-10-CM | POA: Insufficient documentation

## 2012-05-18 DIAGNOSIS — F411 Generalized anxiety disorder: Secondary | ICD-10-CM | POA: Insufficient documentation

## 2012-05-18 DIAGNOSIS — Z8673 Personal history of transient ischemic attack (TIA), and cerebral infarction without residual deficits: Secondary | ICD-10-CM | POA: Insufficient documentation

## 2012-05-18 DIAGNOSIS — Z79899 Other long term (current) drug therapy: Secondary | ICD-10-CM | POA: Insufficient documentation

## 2012-05-18 DIAGNOSIS — F3289 Other specified depressive episodes: Secondary | ICD-10-CM | POA: Insufficient documentation

## 2012-05-18 DIAGNOSIS — R6889 Other general symptoms and signs: Secondary | ICD-10-CM | POA: Insufficient documentation

## 2012-05-18 DIAGNOSIS — E039 Hypothyroidism, unspecified: Secondary | ICD-10-CM | POA: Insufficient documentation

## 2012-05-18 NOTE — ED Provider Notes (Addendum)
History     CSN: [id number]  Arrival date & time 05/18/12  1530   First MD Initiated Contact with Patient 05/18/12 1621      Chief Complaint  Patient presents with  . Foreign Body    (Consider location/radiation/quality/duration/timing/severity/associated sxs/prior treatment) The history is provided by the patient and the spouse.   66 year old, male, had a hemorrhagic stroke, last week, which was evacuated by the neurosurgery service.  He was discharged from the hospital to a skilled nursing facility with an NG tube in place.  He was gagging, so, he put his finger in his mouth and partially pulled out.  The NG tube therefore, he came to the emergency department for evaluation.  Upon arrival, the NG tube was removed completely.  The patient has no complaints.  He has garbled speech, but he is oriented  Past Medical History  Diagnosis Date  . HTN (hypertension)   . CAD S/P percutaneous coronary angioplasty   . Hyperlipidemia   . COPD (chronic obstructive pulmonary disease)   . Tobacco abuse   . Hypothyroidism   . Depression   . Anxiety   . BPH (benign prostatic hypertrophy)     Past Surgical History  Procedure Date  . 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 Rotator cuff replaced     right  . Pins r lower leg     History reviewed. No pertinent family history.  History  Substance Use Topics  . Smoking status: Current Every Day Smoker -- 2.0 packs/day    Types: Cigarettes  . Smokeless tobacco: Not on file  . Alcohol Use: 3.0 oz/week    5 Cans of beer per week     weekend drinker, had 5 beers on 05/11/12, prior to stroke      Review of Systems  All other systems reviewed and are negative.    Allergies  Penicillins  Home Medications   Current Outpatient Rx  Name Route Sig Dispense Refill  . ALBUTEROL SULFATE HFA 108 (90 BASE) MCG/ACT IN AERS Inhalation Inhale 2 puffs into the lungs every 6 (six) hours as needed. For  shortness of breath    . ATORVASTATIN CALCIUM 20 MG PO TABS Tube Give 20 mg by tube daily.     . BUDESONIDE-FORMOTEROL FUMARATE 160-4.5 MCG/ACT IN AERO Inhalation Inhale 2 puffs into the lungs 2 (two) times daily.    . CHLORHEXIDINE GLUCONATE 0.12 % MT SOLN Mouth/Throat Use as directed 15 mLs in the mouth or throat 2 (two) times daily.    Marland Kitchen CITALOPRAM HYDROBROMIDE 20 MG PO TABS Tube Give 20 mg by tube daily.     Marland Kitchen ENOXAPARIN SODIUM 40 MG/0.4ML [place] SOLN Subcutaneous Inject 40 mg into the skin daily.    . IPRATROPIUM BROMIDE 0.02 % IN SOLN Nebulization Take 500 mcg by nebulization 4 (four) times daily.    Marland Kitchen LEVOTHYROXINE SODIUM 150 MCG PO TABS Tube Give 150 mcg by tube daily.     Marland Kitchen LOSARTAN POTASSIUM 50 MG PO TABS Tube Give 50 mg by tube daily.     Marland Kitchen JEVITY 1.2 CAL PO LIQD Per Tube Place 1,000 mLs into feeding tube continuous. 10000 mL 2  . PANTOPRAZOLE 40 MG/20 ML SUSPENSION Per Tube Place 40 mg into feeding tube daily.    Marland Kitchen TAMSULOSIN HCL 0.4 MG PO CAPS Feeding Tube 0.4 mg by Feeding Tube route daily.     Marland Kitchen FREE WATER Per Tube Place 200 mLs  into feeding tube every 6 (six) hours. 24000 mL 2    BP 163/80  Pulse 52  Temp 98.4 F (36.9 C) (Oral)  Resp 18  SpO2 97%  Physical Exam  Nursing note and vitals reviewed. Constitutional: He is oriented to person, place, and time. He appears well-developed and well-nourished. No distress.  HENT:  Head: Normocephalic and atraumatic.  Eyes: Conjunctivae normal and EOM are normal.  Neck: Normal range of motion. Neck supple.  Pulmonary/Chest: Effort normal.  Abdominal: He exhibits no distension.  Musculoskeletal: Normal range of motion.  Neurological: He is alert and oriented to person, place, and time.  Skin: Skin is warm and dry.  Psychiatric: He has a normal mood and affect. Thought content normal.    ED Course  Procedures (including critical care time) 66 year old, male, with recent hemorrhagic stroke, presents to the ER for evaluation of  removal of an NG tube.  He is asymptomatic.  There are no indications for testing.  We will give him oral fluids, to see if he can tolerate them and then oral solids to see if he tolerates that as well, and then determine if there is any indication for reinsertion of the NG tube.  There are no indications for radiographs or laboratory testing.  At this time  Labs Reviewed - No data to display No results found.   No diagnosis found.  6:41 PM Pt wants to go home. He BOTH drank and ate in the ed without difficulty  MDM  Recent stroke         Cheri Guppy, MD 05/18/12 1756  Cheri Guppy, MD 05/18/12 1842

## 2012-05-18 NOTE — ED Notes (Signed)
XBJ:[id number]<NF> Expected date:05/18/12<BR> Expected time: 3:30 PM<BR> Means of arrival:Ambulance<BR> Comments:<BR> NG tube needs to be inserted

## 2012-05-18 NOTE — ED Notes (Signed)
V/o per Dr. Weldon Inches ok to give po drink & done.

## 2012-05-18 NOTE — ED Notes (Signed)
Per Dr. Weldon Inches ok to give food. Apple sauce given per request of pat.

## 2012-05-18 NOTE — ED Notes (Signed)
Ambulated to restroom without problems.

## 2012-05-18 NOTE — ED Notes (Signed)
Patient is still sipping on ginger ale no coughing, no drooling noted prior during and after drinking.

## 2012-05-18 NOTE — ED Notes (Addendum)
Per EMS patient transported from [facility] (arrived yesterday for CVA rehab.) here for NG tube replacement. Noted patient's speech is garbled, distal NG tube is in mouth. Therefore NG tube removed by primary nurse. Also, stated by EMS NG tube feedings still infusing on their arrival. Patient stated he has been complaining about NG tube since 600am 05/17/12. Discharged with NG tube from [facility]. Patient is scheduled for [facility] [place] on Mon. If not passed will have to have a g-peg. Per family member patient has not had speech/physical therapy yet.

## 2012-05-18 NOTE — ED Notes (Signed)
No problems with eating po food took a bite of sandwich no coughing, & no drooling. Patient stated he is now full so has stopped eating.

## 2012-05-20 ENCOUNTER — Ambulatory Visit (HOSPITAL_COMMUNITY)
Admission: AD | Admit: 2012-05-20 | Discharge: 2012-05-20 | Disposition: A | Discharge status: Home / Self Care | Payer: Medicare Other | Source: Other Acute Inpatient Hospital | Attending: Nurse Practitioner | Admitting: Nurse Practitioner

## 2012-05-20 ENCOUNTER — Ambulatory Visit (HOSPITAL_COMMUNITY)
Admission: RE | Admit: 2012-05-20 | Discharge: 2012-05-20 | Disposition: A | Discharge status: Home / Self Care | Payer: Medicare Other | Source: Ambulatory Visit | Attending: Nurse Practitioner | Admitting: Nurse Practitioner

## 2012-05-20 ENCOUNTER — Telehealth (HOSPITAL_COMMUNITY): Payer: Self-pay

## 2012-05-20 ENCOUNTER — Other Ambulatory Visit (HOSPITAL_COMMUNITY): Payer: Self-pay | Admitting: Interventional Radiology

## 2012-05-20 DIAGNOSIS — I63232 Cerebral infarction due to unspecified occlusion or stenosis of left carotid arteries: Secondary | ICD-10-CM

## 2012-05-20 DIAGNOSIS — I10 Essential (primary) hypertension: Secondary | ICD-10-CM | POA: Insufficient documentation

## 2012-05-20 DIAGNOSIS — R1319 Other dysphagia: Secondary | ICD-10-CM | POA: Insufficient documentation

## 2012-05-20 DIAGNOSIS — F17200 Nicotine dependence, unspecified, uncomplicated: Secondary | ICD-10-CM | POA: Insufficient documentation

## 2012-05-20 DIAGNOSIS — I639 Cerebral infarction, unspecified: Secondary | ICD-10-CM

## 2012-05-20 DIAGNOSIS — F3289 Other specified depressive episodes: Secondary | ICD-10-CM | POA: Insufficient documentation

## 2012-05-20 DIAGNOSIS — I2510 Atherosclerotic heart disease of native coronary artery without angina pectoris: Secondary | ICD-10-CM | POA: Insufficient documentation

## 2012-05-20 DIAGNOSIS — R1310 Dysphagia, unspecified: Secondary | ICD-10-CM

## 2012-05-20 DIAGNOSIS — F329 Major depressive disorder, single episode, unspecified: Secondary | ICD-10-CM | POA: Insufficient documentation

## 2012-05-20 DIAGNOSIS — J4489 Other specified chronic obstructive pulmonary disease: Secondary | ICD-10-CM | POA: Insufficient documentation

## 2012-05-20 DIAGNOSIS — R4701 Aphasia: Secondary | ICD-10-CM

## 2012-05-20 DIAGNOSIS — F411 Generalized anxiety disorder: Secondary | ICD-10-CM | POA: Insufficient documentation

## 2012-05-20 DIAGNOSIS — N400 Enlarged prostate without lower urinary tract symptoms: Secondary | ICD-10-CM | POA: Insufficient documentation

## 2012-05-20 DIAGNOSIS — J449 Chronic obstructive pulmonary disease, unspecified: Secondary | ICD-10-CM | POA: Insufficient documentation

## 2012-05-20 DIAGNOSIS — Z09 Encounter for follow-up examination after completed treatment for conditions other than malignant neoplasm: Secondary | ICD-10-CM

## 2012-05-20 NOTE — Telephone Encounter (Signed)
I left a vm on the pts home phone stating his 6week f/u apt with Dr. Corliss Skains

## 2012-05-20 NOTE — Procedures (Signed)
Objective Swallowing Evaluation: Modified Barium Swallowing Study  Patient Details  Name: Paul Bass MRN: [id number] Date of Birth: 12-Nov-1945  Today's Date: 05/20/2012 Time: 4098-1191 SLP Time Calculation (min): 25 min  Past Medical History:  Past Medical History  Diagnosis Date  . HTN (hypertension)   . CAD S/P percutaneous coronary angioplasty   . Hyperlipidemia   . COPD (chronic obstructive pulmonary disease)   . Tobacco abuse   . Hypothyroidism   . Depression   . Anxiety   . BPH (benign prostatic hypertrophy)    Past Surgical History:  Past Surgical History  Procedure Date  . 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 Rotator cuff replaced     right  . Pins r lower leg    HPI:  Paul Bass is a 66 year old male with a recent left MCA occlusion, given TPA. Pt is known to this SLP from d/c 3 days ago. Pt intiall presented with a severe apraxia, rapidly resolving. Inital moderate dysphagia seen on FEES with weakness and residuals, poor oral control and inability to follow compensatory strategies. Pt d/cd to SNF Friday with NG tube in place. On saturday complications with the NG developed and it was removed in ED. Apparently ER MD started pt on thickened liquid diet which pt has been consuming at SNF this weekend. Pt returns today for OP MBS to determine safety with POs and possiblity to d/c home.      Assessment / Plan / Recommendation Clinical Impression  Dysphagia Diagnosis: Mild oral phase dysphagia;Mild pharyngeal phase dysphagia Clinical impression: Pt presents with drastic improvement in oral and oropharyngeal fucntion with improved automatic coordination of movement, improved sensation for timing of swallow response and improved strength. Pt continued to demonstrate a mild motor planning based oral dysphagia with some mild lingual pumping and oral residuals remaining post swallow. Initiation of swallow was briefly, intermittently  delayed and improving with repeated trials. Pt exhibited some trace silent penetration above the cords which he cleared with a cued throat clear. Mild vallecular residuals remained due to base of tongue weakness.  Pt cleared with a second swallow in 75 % of attempts. At this time the pt is safe to initiate a dysphagia 3 diet (mechanical soft, pt expressed some anxiety regarding solid foods), and thin liquids with a second swallow and throat clear. At this time the pt would best benefit from f/u with outpatient SLP as soon as possible. Pts function is rapidly improving and early, intensive therpay may best benefit return to baseline.     Treatment Recommendation  Defer treatment plan to SLP at (Comment) (outpatient)    Diet Recommendation Dysphagia 3 (Mechanical Soft);Thin liquid   Liquid Administration via: Cup;Straw Medication Administration: Whole meds with puree Supervision: Patient able to self feed Compensations: Slow rate;Small sips/bites;Multiple dry swallows after each bite/sip;Clear throat intermittently Postural Changes and/or Swallow Maneuvers: Seated upright 90 degrees    Other  Recommendations Oral Care Recommendations: Patient independent with oral care   Follow Up Recommendations  Outpatient SLP    Frequency and Duration        Pertinent Vitals/Pain None per pt      General HPI: Mr. Kucher is a 66 year old male with a recent left MCA occlusion, given TPA. Pt is known to this SLP from d/c 3 days ago. Pt intiall presented with a severe apraxia, rapidly resolving. Inital moderate dysphagia seen on FEES with weakness and residuals, poor  oral control and inability to follow compensatory strategies. Pt d/cd to SNF Friday with NG tube in place. On saturday complications with the NG developed and it was removed in ED. Apparently ER MD started pt on thickened liquid diet which pt has been consuming at SNF this weekend. Pt returns today for OP MBS to determine safety with POs and  possiblity to d/c home.  Type of Study: Modified Barium Swallowing Study Reason for Referral: Objectively evaluate swallowing function Previous Swallow Assessment: FEES 10/15 Diet Prior to this Study: Dysphagia 3 (soft);Nectar-thick liquids Temperature Spikes Noted: No Respiratory Status: Room air History of Recent Intubation: Yes Length of Intubations (days): 2 days Date extubated: 05/12/12 Behavior/Cognition: Alert;Cooperative;Pleasant mood;Requires cueing Oral Cavity - Dentition: Dentures, bottom;Dentures, top Oral Motor / Sensory Function: Impaired motor (discoordinated movements, now mild, difficutl initiaton) Self-Feeding Abilities: Able to feed self Patient Positioning: Upright in chair Baseline Vocal Quality: Clear Volitional Cough: Strong Volitional Swallow: Able to elicit Anatomy: Within functional limits Pharyngeal Secretions: Not observed secondary MBS    Reason for Referral Objectively evaluate swallowing function   Oral Phase Oral Preparation/Oral Phase Oral Phase: Impaired Oral - Nectar Oral - Nectar Teaspoon: Lingual pumping;Lingual/palatal residue Oral - Nectar Cup: Lingual pumping;Delayed oral transit Oral - Thin Oral - Thin Cup: Lingual pumping;Delayed oral transit;Lingual/palatal residue Oral - Thin Straw: Lingual pumping;Delayed oral transit;Lingual/palatal residue Oral - Solids Oral - Puree: Lingual pumping Oral - Regular: Lingual pumping Oral - Pill: Lingual pumping;Reduced posterior propulsion;Other (Comment) (masticated pill)   Pharyngeal Phase Pharyngeal Phase Pharyngeal Phase: Impaired Pharyngeal - Nectar Pharyngeal - Nectar Teaspoon: Premature spillage to valleculae;Delayed swallow initiation;Reduced epiglottic inversion;Reduced tongue base retraction;Pharyngeal residue - valleculae;Pharyngeal residue - pyriform sinuses;Reduced laryngeal elevation Penetration/Aspiration details (nectar teaspoon): Material does not enter airway Pharyngeal - Nectar  Cup: Premature spillage to valleculae;Delayed swallow initiation;Reduced epiglottic inversion;Reduced tongue base retraction;Pharyngeal residue - valleculae;Pharyngeal residue - pyriform sinuses;Reduced laryngeal elevation Penetration/Aspiration details (nectar cup): Material does not enter airway Pharyngeal - Thin Pharyngeal - Thin Cup: Delayed swallow initiation;Reduced epiglottic inversion;Reduced tongue base retraction;Pharyngeal residue - valleculae;Pharyngeal residue - pyriform sinuses;Reduced laryngeal elevation;Premature spillage to pyriform sinuses;Penetration/Aspiration before swallow Penetration/Aspiration details (thin cup): Material enters airway, remains ABOVE vocal cords and not ejected out Pharyngeal - Thin Straw: Delayed swallow initiation;Reduced epiglottic inversion;Reduced tongue base retraction;Pharyngeal residue - valleculae;Pharyngeal residue - pyriform sinuses;Reduced laryngeal elevation;Premature spillage to pyriform sinuses Pharyngeal - Solids Pharyngeal - Puree: Premature spillage to valleculae;Delayed swallow initiation;Reduced epiglottic inversion;Reduced tongue base retraction;Pharyngeal residue - valleculae;Pharyngeal residue - pyriform sinuses;Reduced laryngeal elevation Penetration/Aspiration details (puree): Material does not enter airway Pharyngeal - Regular: Premature spillage to valleculae;Delayed swallow initiation;Reduced epiglottic inversion;Reduced tongue base retraction;Pharyngeal residue - valleculae;Pharyngeal residue - pyriform sinuses;Reduced laryngeal elevation Pharyngeal - Pill: Not tested (Pt masticated tablet)  Cervical Esophageal Phase    GO    Cervical Esophageal Phase Cervical Esophageal Phase: [facility]    Functional Assessment Tool Used: clinical judgement Functional Limitations: Swallowing Swallow Current Status ([id number]): At least 20 percent but less than 40 percent impaired, limited or restricted Swallow Discharge Status [phone]): At least 20  percent but less than 40 percent impaired, limited or restricted    [facility], [place] CCC-SLP [phone]  Claudine Mouton 05/20/2012, 11:55 AM

## 2012-05-21 ENCOUNTER — Ambulatory Visit: Payer: Medicare Other | Attending: Neurology

## 2012-05-21 DIAGNOSIS — IMO0001 Reserved for inherently not codable concepts without codable children: Secondary | ICD-10-CM | POA: Insufficient documentation

## 2012-05-21 DIAGNOSIS — I69920 Aphasia following unspecified cerebrovascular disease: Secondary | ICD-10-CM | POA: Insufficient documentation

## 2012-05-21 DIAGNOSIS — R1312 Dysphagia, oropharyngeal phase: Secondary | ICD-10-CM | POA: Insufficient documentation

## 2012-05-21 DIAGNOSIS — R482 Apraxia: Secondary | ICD-10-CM | POA: Insufficient documentation

## 2012-05-21 DIAGNOSIS — I69991 Dysphagia following unspecified cerebrovascular disease: Secondary | ICD-10-CM | POA: Insufficient documentation

## 2012-05-25 ENCOUNTER — Emergency Department: Payer: Self-pay | Admitting: Emergency Medicine

## 2012-05-25 LAB — URINALYSIS, COMPLETE
Bilirubin,UR: NEGATIVE
Blood: NEGATIVE
Ketone: NEGATIVE
Nitrite: NEGATIVE
Ph: 6 (ref 4.5–8.0)
Protein: NEGATIVE
RBC,UR: NONE SEEN /HPF (ref 0–5)
WBC UR: 5 /HPF (ref 0–5)

## 2012-05-27 ENCOUNTER — Ambulatory Visit: Payer: Medicare Other | Admitting: Speech Pathology

## 2012-05-29 ENCOUNTER — Ambulatory Visit: Payer: Medicare Other | Admitting: Speech Pathology

## 2012-05-31 ENCOUNTER — Ambulatory Visit: Payer: Medicare Other | Attending: Neurology

## 2012-05-31 DIAGNOSIS — R1312 Dysphagia, oropharyngeal phase: Secondary | ICD-10-CM | POA: Insufficient documentation

## 2012-05-31 DIAGNOSIS — I69920 Aphasia following unspecified cerebrovascular disease: Secondary | ICD-10-CM | POA: Insufficient documentation

## 2012-05-31 DIAGNOSIS — IMO0001 Reserved for inherently not codable concepts without codable children: Secondary | ICD-10-CM | POA: Insufficient documentation

## 2012-05-31 DIAGNOSIS — R482 Apraxia: Secondary | ICD-10-CM | POA: Insufficient documentation

## 2012-05-31 DIAGNOSIS — I69991 Dysphagia following unspecified cerebrovascular disease: Secondary | ICD-10-CM | POA: Insufficient documentation

## 2012-06-03 ENCOUNTER — Ambulatory Visit: Payer: Medicare Other | Admitting: Speech Pathology

## 2012-06-06 ENCOUNTER — Encounter: Payer: Medicare Other | Admitting: Speech Pathology

## 2012-06-07 ENCOUNTER — Ambulatory Visit: Payer: Medicare Other

## 2012-06-10 ENCOUNTER — Ambulatory Visit: Payer: Medicare Other | Admitting: Speech Pathology

## 2012-06-12 ENCOUNTER — Ambulatory Visit: Payer: Medicare Other

## 2012-06-17 ENCOUNTER — Ambulatory Visit: Payer: Medicare Other | Admitting: Speech Pathology

## 2012-06-17 ENCOUNTER — Telehealth (HOSPITAL_COMMUNITY): Payer: Self-pay | Admitting: *Deleted

## 2012-06-17 NOTE — Telephone Encounter (Signed)
Radiology post stroke phone call to obtain Rankin Score.  Pt reports that he is doing great with only lasting disability being difficulty with speech.  Swallowing is back to normal per pt.  Rankin score 1.

## 2012-06-19 ENCOUNTER — Ambulatory Visit (HOSPITAL_COMMUNITY)
Admission: RE | Admit: 2012-06-19 | Discharge: 2012-06-19 | Disposition: A | Discharge status: Home / Self Care | Payer: Medicare Other | Source: Ambulatory Visit | Attending: Interventional Radiology | Admitting: Interventional Radiology

## 2012-06-19 ENCOUNTER — Ambulatory Visit: Payer: Medicare Other

## 2012-06-19 DIAGNOSIS — I63232 Cerebral infarction due to unspecified occlusion or stenosis of left carotid arteries: Secondary | ICD-10-CM

## 2012-06-19 DIAGNOSIS — Z09 Encounter for follow-up examination after completed treatment for conditions other than malignant neoplasm: Secondary | ICD-10-CM

## 2012-06-24 ENCOUNTER — Ambulatory Visit: Payer: Medicare Other | Admitting: Speech Pathology

## 2012-06-26 ENCOUNTER — Ambulatory Visit: Payer: Medicare Other

## 2012-07-03 ENCOUNTER — Ambulatory Visit: Payer: Medicare Other | Attending: Neurology

## 2012-07-03 DIAGNOSIS — I69920 Aphasia following unspecified cerebrovascular disease: Secondary | ICD-10-CM | POA: Insufficient documentation

## 2012-07-03 DIAGNOSIS — IMO0001 Reserved for inherently not codable concepts without codable children: Secondary | ICD-10-CM | POA: Insufficient documentation

## 2012-07-03 DIAGNOSIS — R482 Apraxia: Secondary | ICD-10-CM | POA: Insufficient documentation

## 2012-07-03 DIAGNOSIS — I69991 Dysphagia following unspecified cerebrovascular disease: Secondary | ICD-10-CM | POA: Insufficient documentation

## 2012-07-03 DIAGNOSIS — R1312 Dysphagia, oropharyngeal phase: Secondary | ICD-10-CM | POA: Insufficient documentation

## 2012-07-09 ENCOUNTER — Ambulatory Visit: Payer: Medicare Other | Admitting: *Deleted

## 2012-07-10 ENCOUNTER — Ambulatory Visit: Payer: Medicare Other

## 2012-07-15 ENCOUNTER — Ambulatory Visit: Payer: Medicare Other | Admitting: Occupational Therapy

## 2012-07-15 ENCOUNTER — Ambulatory Visit: Payer: Medicare Other

## 2012-07-25 ENCOUNTER — Ambulatory Visit: Payer: Medicare Other

## 2012-07-30 ENCOUNTER — Ambulatory Visit: Payer: Medicare Other | Admitting: Occupational Therapy

## 2012-07-30 ENCOUNTER — Ambulatory Visit: Payer: Medicare Other

## 2012-08-06 ENCOUNTER — Ambulatory Visit: Payer: Medicare Other | Admitting: Speech Pathology

## 2012-08-06 ENCOUNTER — Ambulatory Visit: Payer: Medicare Other | Attending: Neurology | Admitting: Occupational Therapy

## 2012-08-06 DIAGNOSIS — R482 Apraxia: Secondary | ICD-10-CM | POA: Insufficient documentation

## 2012-08-06 DIAGNOSIS — I69991 Dysphagia following unspecified cerebrovascular disease: Secondary | ICD-10-CM | POA: Insufficient documentation

## 2012-08-06 DIAGNOSIS — IMO0001 Reserved for inherently not codable concepts without codable children: Secondary | ICD-10-CM | POA: Insufficient documentation

## 2012-08-06 DIAGNOSIS — I69920 Aphasia following unspecified cerebrovascular disease: Secondary | ICD-10-CM | POA: Insufficient documentation

## 2012-08-06 DIAGNOSIS — R1312 Dysphagia, oropharyngeal phase: Secondary | ICD-10-CM | POA: Insufficient documentation

## 2012-08-08 ENCOUNTER — Encounter: Payer: Medicare Other | Admitting: Occupational Therapy

## 2012-08-12 ENCOUNTER — Ambulatory Visit: Payer: Medicare Other

## 2012-08-12 ENCOUNTER — Ambulatory Visit: Payer: Medicare Other | Admitting: Occupational Therapy

## 2012-08-15 ENCOUNTER — Encounter: Payer: Medicare Other | Admitting: Occupational Therapy

## 2012-08-22 ENCOUNTER — Ambulatory Visit: Payer: Medicare Other | Admitting: Occupational Therapy

## 2012-08-22 ENCOUNTER — Ambulatory Visit: Payer: Medicare Other

## 2012-08-22 ENCOUNTER — Other Ambulatory Visit (HOSPITAL_COMMUNITY): Payer: Self-pay | Admitting: Interventional Radiology

## 2012-08-22 DIAGNOSIS — I639 Cerebral infarction, unspecified: Secondary | ICD-10-CM

## 2012-08-26 DIAGNOSIS — Z8042 Family history of malignant neoplasm of prostate: Secondary | ICD-10-CM | POA: Insufficient documentation

## 2012-08-26 DIAGNOSIS — N411 Chronic prostatitis: Secondary | ICD-10-CM | POA: Insufficient documentation

## 2012-08-26 DIAGNOSIS — N401 Enlarged prostate with lower urinary tract symptoms: Secondary | ICD-10-CM | POA: Insufficient documentation

## 2012-08-26 DIAGNOSIS — R339 Retention of urine, unspecified: Secondary | ICD-10-CM | POA: Insufficient documentation

## 2012-08-29 ENCOUNTER — Ambulatory Visit: Payer: Medicare Other | Admitting: Occupational Therapy

## 2012-08-29 ENCOUNTER — Ambulatory Visit: Payer: Medicare Other

## 2012-09-12 ENCOUNTER — Encounter (HOSPITAL_COMMUNITY): Payer: Self-pay | Admitting: Pharmacist

## 2012-09-17 ENCOUNTER — Other Ambulatory Visit: Payer: Self-pay | Admitting: Radiology

## 2012-09-18 ENCOUNTER — Other Ambulatory Visit: Payer: Self-pay | Admitting: Radiology

## 2012-09-20 ENCOUNTER — Ambulatory Visit (HOSPITAL_COMMUNITY)
Admission: RE | Admit: 2012-09-20 | Discharge: 2012-09-20 | Disposition: A | Discharge status: Home / Self Care | Payer: Medicare Other | Source: Ambulatory Visit | Attending: Interventional Radiology | Admitting: Interventional Radiology

## 2012-09-20 ENCOUNTER — Encounter (HOSPITAL_COMMUNITY): Payer: Self-pay

## 2012-09-20 ENCOUNTER — Other Ambulatory Visit (HOSPITAL_COMMUNITY): Payer: Self-pay | Admitting: Interventional Radiology

## 2012-09-20 DIAGNOSIS — I6529 Occlusion and stenosis of unspecified carotid artery: Secondary | ICD-10-CM | POA: Insufficient documentation

## 2012-09-20 DIAGNOSIS — I10 Essential (primary) hypertension: Secondary | ICD-10-CM | POA: Insufficient documentation

## 2012-09-20 DIAGNOSIS — Z09 Encounter for follow-up examination after completed treatment for conditions other than malignant neoplasm: Secondary | ICD-10-CM | POA: Insufficient documentation

## 2012-09-20 DIAGNOSIS — T82898A Other specified complication of vascular prosthetic devices, implants and grafts, initial encounter: Secondary | ICD-10-CM | POA: Insufficient documentation

## 2012-09-20 DIAGNOSIS — I6509 Occlusion and stenosis of unspecified vertebral artery: Secondary | ICD-10-CM | POA: Insufficient documentation

## 2012-09-20 DIAGNOSIS — J4489 Other specified chronic obstructive pulmonary disease: Secondary | ICD-10-CM | POA: Insufficient documentation

## 2012-09-20 DIAGNOSIS — I658 Occlusion and stenosis of other precerebral arteries: Secondary | ICD-10-CM | POA: Insufficient documentation

## 2012-09-20 DIAGNOSIS — Y831 Surgical operation with implant of artificial internal device as the cause of abnormal reaction of the patient, or of later complication, without mention of misadventure at the time of the procedure: Secondary | ICD-10-CM | POA: Insufficient documentation

## 2012-09-20 DIAGNOSIS — Z8673 Personal history of transient ischemic attack (TIA), and cerebral infarction without residual deficits: Secondary | ICD-10-CM | POA: Insufficient documentation

## 2012-09-20 LAB — CBC WITH DIFFERENTIAL/PLATELET
Basophils Absolute: 0 10*3/uL (ref 0.0–0.1)
Eosinophils Relative: 8 % — ABNORMAL HIGH (ref 0–5)
HCT: 40.8 % (ref 39.0–52.0)
Hemoglobin: 14.8 g/dL (ref 13.0–17.0)
Lymphocytes Relative: 27 % (ref 12–46)
Lymphs Abs: 1.4 10*3/uL (ref 0.7–4.0)
MCV: 90.9 fL (ref 78.0–100.0)
Monocytes Absolute: 0.4 10*3/uL (ref 0.1–1.0)
Monocytes Relative: 8 % (ref 3–12)
Neutro Abs: 3 10*3/uL (ref 1.7–7.7)
RDW: 12.6 % (ref 11.5–15.5)
WBC: 5.2 10*3/uL (ref 4.0–10.5)

## 2012-09-20 LAB — BASIC METABOLIC PANEL
CO2: 27 mEq/L (ref 19–32)
Chloride: 104 mEq/L (ref 96–112)
Creatinine, Ser: 0.87 mg/dL (ref 0.50–1.35)
GFR calc Af Amer: >90 mL/min (ref >90)
Potassium: 4.3 mEq/L (ref 3.5–5.1)

## 2012-09-20 LAB — APTT: aPTT: 33 seconds (ref 24–37)

## 2012-09-20 MED ORDER — MIDAZOLAM HCL 2 MG/2ML IJ SOLN
INTRAMUSCULAR | Status: AC
  Filled 2012-09-20: qty 4

## 2012-09-20 MED ORDER — IOHEXOL 300 MG/ML  SOLN
150.0000 mL | Freq: Once | INTRAMUSCULAR | Status: AC | PRN
  Administered 2012-09-20: 75 mL via INTRA_ARTERIAL

## 2012-09-20 MED ORDER — MIDAZOLAM HCL 2 MG/2ML IJ SOLN
INTRAMUSCULAR | Status: AC | PRN
  Administered 2012-09-20: 1 mg via INTRAVENOUS

## 2012-09-20 MED ORDER — HEPARIN SOD (PORK) LOCK FLUSH 100 UNIT/ML IV SOLN
INTRAVENOUS | Status: AC | PRN
  Administered 2012-09-20: 500 [IU] via INTRAVENOUS

## 2012-09-20 MED ORDER — FENTANYL CITRATE 0.05 MG/ML IJ SOLN
INTRAMUSCULAR | Status: AC | PRN
  Administered 2012-09-20: 25 ug via INTRAVENOUS

## 2012-09-20 MED ORDER — FENTANYL CITRATE 0.05 MG/ML IJ SOLN
INTRAMUSCULAR | Status: AC
  Filled 2012-09-20: qty 2

## 2012-09-20 MED ORDER — SODIUM CHLORIDE 0.9 % IV SOLN
Freq: Once | INTRAVENOUS | Status: AC
  Administered 2012-09-20: 08:00:00 via INTRAVENOUS

## 2012-09-20 MED ORDER — SODIUM CHLORIDE 0.9 % IV SOLN: INTRAVENOUS | Status: AC

## 2012-09-20 NOTE — H&P (Signed)
Paul Bass is an 67 y.o. male.   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 HTN (hypertension)   . CAD S/P percutaneous coronary angioplasty   . Hyperlipidemia   . COPD (chronic obstructive pulmonary disease)   . Tobacco abuse   . Hypothyroidism   . Depression   . Anxiety   . BPH (benign prostatic hypertrophy)     Past Surgical History  Procedure Laterality Date  . 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 Rotator cuff replaced      right  . Pins r lower leg      History reviewed. No pertinent family history. Social History:  reports that he quit smoking about 4 months ago. His smoking use included Cigarettes. He smoked 2.00 packs per day. He does not have any smokeless tobacco history on file. He reports that he drinks about 3.0 ounces of alcohol per week. His drug history is not on file.  Allergies: No Known Allergies   (Not in a hospital admission)  No results found for this or any previous visit (from the past 48 hour(s)). No results found.  Review of Systems  Constitutional: Negative for fever.  Respiratory: Positive for cough and wheezing.   Cardiovascular: Negative for chest pain.  Gastrointestinal: Negative for nausea and vomiting.  Neurological: Positive for focal weakness and weakness. Negative for dizziness, seizures and headaches.    Blood pressure 117/66, pulse 48, temperature 97.2 F (36.2 C), temperature source Oral, resp. rate 18, height 6' (1.829 m), weight 240 lb (108.863 kg), SpO2 97.00%. Physical Exam  Constitutional: He is oriented to person, place, and time. He appears well-developed and well-nourished.  Eyes:  EOM are normal.  Rt eye droop; pre existing even prior to CVA  Neck: Normal range of motion.  Cardiovascular: Normal rate, regular rhythm and normal heart sounds.   No murmur heard. Respiratory: Effort normal. He has wheezes.  GI: Soft. Bowel sounds are normal. There is no tenderness.  Musculoskeletal: Normal range of motion.  Rt arm weakness since CVA 10/13  Neurological: He is alert and oriented to person, place, and time. No cranial nerve deficit. Coordination normal.  Skin: Skin is warm and dry.  Psychiatric: He has a normal mood and affect. His behavior is normal. Judgment and thought content normal.     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

## 2012-09-20 NOTE — Procedures (Signed)
S/P 4 vessle cerebral arteriogram  Rt CFA approach Findings  1.widely patent LT CCA/ICA stent. 2 Appro  60 to 65 % stenosis of LT VA at origin

## 2013-01-06 ENCOUNTER — Ambulatory Visit: Payer: Self-pay | Admitting: Ophthalmology

## 2013-01-07 ENCOUNTER — Other Ambulatory Visit: Payer: Self-pay | Admitting: Radiology

## 2013-01-07 DIAGNOSIS — I6529 Occlusion and stenosis of unspecified carotid artery: Secondary | ICD-10-CM

## 2013-01-09 ENCOUNTER — Telehealth (HOSPITAL_COMMUNITY): Payer: Self-pay | Admitting: Interventional Radiology

## 2013-01-14 ENCOUNTER — Ambulatory Visit: Payer: Self-pay | Admitting: Ophthalmology

## 2013-01-17 ENCOUNTER — Telehealth (HOSPITAL_COMMUNITY): Payer: Self-pay | Admitting: Interventional Radiology

## 2013-01-17 NOTE — Telephone Encounter (Signed)
Left VM for pt to call to schedule US of the carotid arteries JM

## 2013-01-22 ENCOUNTER — Encounter: Payer: Self-pay | Admitting: Neurology

## 2013-01-22 DIAGNOSIS — R0683 Snoring: Secondary | ICD-10-CM | POA: Insufficient documentation

## 2013-01-22 DIAGNOSIS — R1310 Dysphagia, unspecified: Secondary | ICD-10-CM | POA: Insufficient documentation

## 2013-01-22 DIAGNOSIS — R400 Somnolence: Secondary | ICD-10-CM

## 2013-01-22 DIAGNOSIS — I6330 Cerebral infarction due to thrombosis of unspecified cerebral artery: Secondary | ICD-10-CM | POA: Insufficient documentation

## 2013-01-22 DIAGNOSIS — I6529 Occlusion and stenosis of unspecified carotid artery: Secondary | ICD-10-CM | POA: Insufficient documentation

## 2013-01-28 ENCOUNTER — Ambulatory Visit (HOSPITAL_COMMUNITY)
Admission: RE | Admit: 2013-01-28 | Discharge: 2013-01-28 | Disposition: A | Discharge status: Home / Self Care | Payer: Medicare Other | Source: Ambulatory Visit | Attending: Interventional Radiology | Admitting: Interventional Radiology

## 2013-01-28 DIAGNOSIS — I6529 Occlusion and stenosis of unspecified carotid artery: Secondary | ICD-10-CM

## 2013-01-28 NOTE — Progress Notes (Signed)
VASCULAR LAB PRELIMINARY  PRELIMINARY  PRELIMINARY  PRELIMINARY  Carotid duplex completed.    Preliminary report:  Right - 40% to 59% upper end of range. Left - 40% to 59% ICA stenosis by velocities. Plaque morphology does not support the increase. Elevated velocities may be in part due to the stent diminishing the size of the lumen. Stent appears patent throughout. Bilateral - Vertebral artery flow is antegrade.  Lonetta Blassingame, RVS 01/28/2013, 2:19 PM

## 2013-02-05 DIAGNOSIS — R972 Elevated prostate specific antigen [PSA]: Secondary | ICD-10-CM | POA: Insufficient documentation

## 2013-02-13 ENCOUNTER — Ambulatory Visit: Payer: Self-pay | Admitting: Neurology

## 2013-04-29 ENCOUNTER — Encounter: Payer: Self-pay | Admitting: Neurology

## 2013-04-29 ENCOUNTER — Ambulatory Visit (INDEPENDENT_AMBULATORY_CARE_PROVIDER_SITE_OTHER): Payer: Medicare Other | Admitting: Neurology

## 2013-04-29 VITALS — BP 134/69 | HR 42 | Temp 97.9°F | Ht 72.0 in | Wt 245.5 lb

## 2013-04-29 DIAGNOSIS — I6789 Other cerebrovascular disease: Secondary | ICD-10-CM

## 2013-04-29 NOTE — Patient Instructions (Addendum)
Continue Plavix for secondary stroke prevention and strict control of hypertension with blood pressure goal below 130/90 and lipids with LDL cholesterol goal below 100 mg percent. Recommend check repeat lipid profile at next visit with his primary care physician. May need to switch to alternative statin in case lipids are suboptimally controlled. Return for followup in 6 months with Larita Fife, NP call earlier if necessary.

## 2013-04-29 NOTE — Progress Notes (Signed)
[facility] [address] [place]. [place] [address] [phone]       OFFICE FOLLOW-UP NOTE  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 Vascular risk factors of hypertension, smoking, coronary artery disease and carotid disease.  Update 04/29/2013  he returns for followup of the last visit on 08/14/2012. She continues to have occasional speech, word finding difficulties as well her right-sided weakness particularly when tired. He feels that his decreased energy and overall strength particularly in the right grip when he can dropped objects at times. He had started going to the gym but had to discontinue because of arthralgias and muscle aches. These writing today to Lipitor and he has reduced his dose from 80 to 40 mg daily. He plans to see his family physician next month to have followup lipid profile checked. He had repeat carotid ultrasound done in my office on 09/05/12 which showed 50-69%  Rt ICA stenosis which were stable and subsequently underwent a diagnostic cerebral angiogram with Dr.Deveshwar  On 09/20/12 which showed 30% intrastent stenosis on left and 50-60 % RICA stenosis.Marland Kitchen History also decided not to discontinue aspirin but has reduced her dose to 81 mg daily. He does complain of bruising in his skin. He has no new neurological symptoms.  ROS:   14 system review of systems is positive for fatigue, snoring, easy bruising and bleeding, joint pain, cramps, aching muscles, racing thoughts, not enough sleep, restless legs.  PMH:  Past Medical History  Diagnosis Date  . HTN (hypertension)    . CAD S/P percutaneous coronary angioplasty   . Hyperlipidemia   . COPD (chronic obstructive pulmonary disease)   . Tobacco abuse   . Hypothyroidism   . Depression   . Anxiety   . BPH (benign prostatic hypertrophy)     Social History:  History   Social History  . Marital Status: Married    Spouse Name: Paul Bass    Number of Children: 1  . Years of Education: HS   Occupational History  . Retired     Part Time [employer]   Social History Main Topics  . Smoking status: Former Smoker -- 2.00 packs/day    Types: Cigarettes    Quit date: 05/20/2012  . Smokeless tobacco: Never Used  . Alcohol Use: 3.0 oz/week    5 Cans of beer per week     Comment: weekend drinker, had 5 beers on 05/11/12, prior to stroke  . Drug Use: Not on file  . Sexual Activity: Not on file   Other Topics Concern  . Not on file   Social History Narrative   Patient lives at home with his wife works at  [employer] has a 12 grade education with 1 child.   Patient quit smoking 05-11-2012 patient drinks alcoholic drinks he also drinks cafinted drinks daily.    Medications:   Current Outpatient Prescriptions on File Prior to Visit  Medication Sig Dispense Refill  . albuterol (PROVENTIL HFA;VENTOLIN HFA) 108 (90 BASE) MCG/ACT inhaler Inhale 2 puffs into the lungs every 6 (six) hours as needed for wheezing or shortness of breath. For shortness of breath      .  aspirin EC 81 MG tablet Take 81 mg by mouth every morning.      . budesonide-formoterol (SYMBICORT) 160-4.5 MCG/ACT inhaler Inhale 2 puffs into the lungs 2 (two) times daily.      . cholecalciferol (VITAMIN D) 400 UNITS TABS Take 400 Units by mouth 2 (two) times daily.      . citalopram (CELEXA) 20 MG tablet Take 20 mg by mouth every morning.       . clopidogrel (PLAVIX) 75 MG tablet Take 75 mg by mouth every morning.      . lansoprazole (PREVACID) 30 MG capsule Take 30 mg by mouth at bedtime.      Marland Kitchen levothyroxine (SYNTHROID, LEVOTHROID) 75 MCG tablet Take  100 mcg by mouth daily before breakfast.       . losartan (COZAAR) 50 MG tablet Take 50 mg by mouth at bedtime.       . Multiple Vitamin (MULTIVITAMIN WITH MINERALS) TABS Take 0.5 tablets by mouth 2 (two) times daily.      . naproxen sodium (ALEVE) 220 MG tablet Take 440 mg by mouth daily as needed (pain).      Marland Kitchen neomycin-bacitracin-polymyxin (NEOSPORIN) ointment Apply 1 application topically daily as needed (for cuts). apply to eye      . Omega-3 Fatty Acids (FISH OIL) 1200 MG CAPS Take 1,000 mg by mouth 2 (two) times daily.      . pravastatin (PRAVACHOL) 80 MG tablet Take 40 mg by mouth at bedtime.       . Tamsulosin HCl (FLOMAX) 0.4 MG CAPS Take 0.4 mg by mouth every morning.        No current facility-administered medications on file prior to visit.    Allergies:  No Known Allergies  Physical Exam General: well developed, well nourished, seated, in no evident distress Head: head normocephalic and atraumatic. Orohparynx benign Neck: supple with no carotid or supraclavicular bruits Cardiovascular: regular rate and rhythm, no murmurs Musculoskeletal: no deformity Skin:  no rash but scattered petichiae Vascular:  Normal pulses all extremities Filed Vitals:   04/29/13 1500  BP: 134/69  Pulse: 42  Temp: 97.9 F (36.6 C)    Neurologic Exam Mental Status: Awake and fully alert. Oriented to place and time. Recent and remote memory intact. Attention span, concentration and fund of knowledge appropriate. Mood and affect appropriate. Occasional disfluency and paraphasic errors. Cranial Nerves: Fundoscopic exam reveals sharp disc margins. Pupils equal, briskly reactive to light. Extraocular movements full without nystagmus. Visual fields full to confrontation. Hearing intact. Facial sensation intact. Face, tongue, palate moves normally and symmetrically.  Motor: Normal bulk and tone. Normal strength in all tested extremity muscles. Diminished fine finger movements on the right. Orbits left  over right upper extremity. Mild weakness of right grip. Sensory.: intact to touch and pinprick and vibratory sensation.  Coordination: Rapid alternating movements normal in all extremities. Finger-to-nose and heel-to-shin performed accurately bilaterally. Gait and Station: Arises from chair without difficulty. Stance is normal. Gait demonstrates normal stride length and balance . Unable to heel, toe and tandem walk without difficulty.  Reflexes: 1+ and symmetric. Toes downgoing.   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 Vascular risk  factors of hypertension, smoking, coronary artery disease and carotid disease.   PLAN: Continue Plavix for secondary stroke prevention and strict control of hypertension with blood pressure goal below 130/90 and lipids with LDL cholesterol goal below 100 mg percent. Recommend check repeat lipid profile at next visit with his primary care physician. May need to switch to alternative statin in case lipids are suboptimally controlled. Return for followup in 6 months with Larita Fife, NP call earlier if necessary.

## 2013-05-01 ENCOUNTER — Other Ambulatory Visit: Payer: Self-pay | Admitting: Radiology

## 2013-05-01 DIAGNOSIS — I6529 Occlusion and stenosis of unspecified carotid artery: Secondary | ICD-10-CM

## 2013-06-11 ENCOUNTER — Telehealth (HOSPITAL_COMMUNITY): Payer: Self-pay | Admitting: Interventional Radiology

## 2013-06-11 NOTE — Telephone Encounter (Signed)
Called pt again, left VM to call and schedule appt. JM

## 2013-06-20 ENCOUNTER — Other Ambulatory Visit (HOSPITAL_COMMUNITY): Payer: Medicare Other

## 2013-07-03 ENCOUNTER — Ambulatory Visit: Payer: Self-pay | Admitting: Unknown Physician Specialty

## 2013-07-04 ENCOUNTER — Ambulatory Visit (HOSPITAL_COMMUNITY)
Admission: RE | Admit: 2013-07-04 | Discharge: 2013-07-04 | Disposition: A | Discharge status: Home / Self Care | Payer: Medicare Other | Source: Ambulatory Visit | Attending: Radiology | Admitting: Radiology

## 2013-07-04 DIAGNOSIS — I6529 Occlusion and stenosis of unspecified carotid artery: Secondary | ICD-10-CM

## 2013-07-04 DIAGNOSIS — I658 Occlusion and stenosis of other precerebral arteries: Secondary | ICD-10-CM | POA: Insufficient documentation

## 2013-07-04 NOTE — Progress Notes (Signed)
Right:  40-59% ICA stenosis.  Left:  <50% ICA stenosis.  Stent is patent.  Bilateral:  Vertebral artery flow is antegrade.

## 2013-08-08 ENCOUNTER — Ambulatory Visit: Payer: Self-pay | Admitting: Unknown Physician Specialty

## 2013-10-24 ENCOUNTER — Encounter: Payer: Self-pay | Admitting: Nurse Practitioner

## 2013-10-24 ENCOUNTER — Encounter (INDEPENDENT_AMBULATORY_CARE_PROVIDER_SITE_OTHER): Payer: Self-pay

## 2013-10-24 ENCOUNTER — Ambulatory Visit (INDEPENDENT_AMBULATORY_CARE_PROVIDER_SITE_OTHER): Payer: Medicare HMO | Admitting: Nurse Practitioner

## 2013-10-24 VITALS — BP 136/66 | HR 45 | Wt 287.0 lb

## 2013-10-24 DIAGNOSIS — R4789 Other speech disturbances: Secondary | ICD-10-CM

## 2013-10-24 DIAGNOSIS — F809 Developmental disorder of speech and language, unspecified: Secondary | ICD-10-CM

## 2013-10-24 DIAGNOSIS — I6789 Other cerebrovascular disease: Secondary | ICD-10-CM

## 2013-10-24 NOTE — Patient Instructions (Signed)
Continue Plavix for secondary stroke prevention and strict control of hypertension with blood pressure goal below 130/90 and lipids with LDL cholesterol goal below 100 mg percent.  Return for followup in 6 months with Jeani Hawking, NP call earlier if necessary.

## 2013-10-24 NOTE — Progress Notes (Signed)
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 Vascular risk factors of hypertension, smoking, coronary artery disease and carotid disease.   Update 04/29/2013 he returns for followup of the last visit on 08/14/2012. He continues to have occasional speech, word finding difficulties as well her right-sided weakness particularly when tired. He feels that his decreased energy and overall strength particularly in the right grip when he can dropped objects at times. He had started going to the gym but had to discontinue because of arthralgias and muscle aches.On Lipitor and he has reduced his dose from 80 to 40 mg daily. He plans to see his family physician next month to have followup lipid profile checked. He had repeat carotid ultrasound done in my office on 09/05/12 which showed 50-69% Rt ICA stenosis which were stable and subsequently underwent a diagnostic cerebral angiogram with Dr.Deveshwar On 09/20/12 which showed 30% intrastent stenosis on left and 50-60 % RICA stenosis.Marland Kitchen He also decided not to discontinue aspirin but has reduced the dose to 81 mg daily. He does complain of bruising in his skin. He has no new neurological symptoms.   Update 10/24/13 (LL):  Paul Bass returns for stroke follow up. He is doing well, states his BP is well controlled and his cholesterol at last check was at goal.  He endorses a weaker right hand grip and some loss of coordination but he has learned to compensate for this.  His last carotid ultrasound was uncharged, done in December 2014. Tolerating  Plavix well without significant bleeding or bruising.  Doing well.   ROS:  14 system review of systems is positive for restless legs, shortness of breath, cold intolerance, speech difficulty, depression, anxiety.   ALLERGIES: No Known Allergies  HOME MEDICATIONS: Outpatient Prescriptions Prior to Visit  Medication Sig Dispense Refill  . albuterol (PROVENTIL HFA;VENTOLIN HFA) 108 (90 BASE) MCG/ACT inhaler Inhale 2 puffs into the lungs every 6 (six) hours as needed for wheezing or shortness of breath. For shortness of breath      . budesonide-formoterol (SYMBICORT) 160-4.5 MCG/ACT inhaler Inhale 2 puffs into the lungs 2 (two) times daily.      . cholecalciferol (VITAMIN D) 400 UNITS TABS Take 400 Units by mouth 2 (two) times daily.      . citalopram (CELEXA) 20 MG tablet Take 20 mg by mouth every morning.       . clopidogrel (PLAVIX) 75 MG tablet Take 75 mg by mouth every morning.      . Coenzyme Q10 (CO Q-10) 200 MG CAPS Take 1 capsule by mouth daily.      . lansoprazole (PREVACID) 30 MG capsule Take 30 mg by mouth at bedtime.      Marland Kitchen levothyroxine (SYNTHROID, LEVOTHROID) 75 MCG tablet Take 100 mcg by mouth daily before breakfast.       . losartan (COZAAR) 50 MG tablet Take 50 mg by mouth at bedtime.       . metFORMIN (GLUCOPHAGE) 500 MG tablet Take 1 tablet by mouth daily.      . Multiple Vitamin (MULTIVITAMIN WITH MINERALS) TABS Take 0.5 tablets by mouth  2 (two) times daily.      Marland Kitchen neomycin-bacitracin-polymyxin (NEOSPORIN) ointment Apply 1 application topically daily as needed (for cuts). apply to eye      . Omega-3 Fatty Acids (FISH OIL) 1200 MG CAPS Take 1,000 mg by mouth 2 (two) times daily.      . pravastatin (PRAVACHOL) 80 MG tablet Take 40 mg by mouth at bedtime.       . Tamsulosin HCl (FLOMAX) 0.4 MG CAPS Take 0.4 mg by mouth every morning.       Marland Kitchen aspirin EC 81 MG tablet Take 81 mg by mouth every morning.      . gabapentin (NEURONTIN) 100 MG capsule Take 1 capsule by mouth daily.       . naproxen sodium (ALEVE) 220 MG tablet Take 440 mg by mouth daily as needed (pain).       No facility-administered medications prior to visit.     PHYSICAL EXAM  Filed Vitals:   10/24/13 1421  BP: 136/66  Pulse: 45  Weight: 287 lb (130.182 kg)   Body mass index is 38.92 kg/(m^2).  Physical Exam  General: well developed, well nourished, seated, in no evident distress  Head: head normocephalic and atraumatic. Orohparynx benign  Neck: supple with no carotid or supraclavicular bruits  Cardiovascular: regular rate and rhythm, no murmurs  Musculoskeletal: no deformity  Skin: no rash but scattered petichiae  Vascular: Normal pulses all extremities   Neurologic Exam  Mental Status: Awake and fully alert. Oriented to place and time. Recent and remote memory intact. Attention span, concentration and fund of knowledge appropriate. Mood and affect appropriate. Occasional disfluency and paraphasic errors.  Cranial Nerves: Fundoscopic exam not done.  Pupils equal, briskly reactive to light. Extraocular movements full without nystagmus. Visual fields full to confrontation. Hearing intact. Facial sensation intact. Face, tongue, palate moves normally and symmetrically.  Motor: Normal bulk and tone. Normal strength in all tested extremity muscles. Diminished fine finger movements on the right. Orbits left over right upper extremity. Mild weakness of right grip.  Sensory: intact to touch and pinprick and vibratory sensation.  Coordination: Rapid alternating movements normal in all extremities. Finger-to-nose and heel-to-shin performed accurately bilaterally.  Gait and Station: Arises from chair without difficulty. Stance is normal. Gait demonstrates normal stride length and balance . Unable to heel, toe and tandem walk without difficulty.  Reflexes: 1+ and symmetric.   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 Vascular risk factors of hypertension, smoking, coronary artery disease and carotid disease.   PLAN:  Continue Plavix for secondary stroke prevention and strict control of hypertension with blood pressure goal below 130/90 and lipids with LDL cholesterol goal below 100 mg percent.  Return for followup in 6 months with Paul Hawking, NP call earlier if necessary. Recheck carotid doppler in December.  Return in about 6 months (around 04/26/2014).  Philmore Pali, MSN, NP-C 10/24/2013, 3:10 PM [facility] [address] [place], [place] [address] [phone]  Note: This document was prepared with digital dictation and possible smart phrase technology. Any transcriptional errors that result from this process are unintentional.

## 2013-10-27 ENCOUNTER — Ambulatory Visit: Payer: Medicare Other | Admitting: Nurse Practitioner

## 2014-01-16 ENCOUNTER — Other Ambulatory Visit: Payer: Self-pay | Admitting: Radiology

## 2014-01-16 DIAGNOSIS — I6523 Occlusion and stenosis of bilateral carotid arteries: Secondary | ICD-10-CM

## 2014-01-19 ENCOUNTER — Telehealth: Payer: Self-pay | Admitting: Neurology

## 2014-01-19 NOTE — Telephone Encounter (Signed)
Barnett Applebaum from [facility] calling to state that they need a letter of clearance stating that patient is ok to drive. Please return call and advise.

## 2014-01-20 ENCOUNTER — Encounter: Payer: Self-pay | Admitting: *Deleted

## 2014-01-20 NOTE — Telephone Encounter (Signed)
Please advise on rather the patient can drive or not, [facility] needs a note.

## 2014-01-20 NOTE — Telephone Encounter (Signed)
From a stroke standpoint, He should be cleared to operate a private vehicle.

## 2014-01-20 NOTE — Telephone Encounter (Signed)
I have called Barnett Applebaum to get her fax number so i can fax over his letter, if she call back please get her  fax number and send it via staff message.

## 2014-01-21 ENCOUNTER — Telehealth: Payer: Self-pay | Admitting: Neurology

## 2014-01-21 NOTE — Telephone Encounter (Signed)
Barnett Applebaum called back states she has not recd the fax, please fax information back to [place] at [phone]. Thanks

## 2014-01-21 NOTE — Telephone Encounter (Signed)
I have called gina back two times i need for her to fax over a request to the office at [phone].Paul Bass before i fax over the letter .

## 2014-01-21 NOTE — Telephone Encounter (Signed)
Patient wife stated she will come in and pick up the letter.

## 2014-02-13 ENCOUNTER — Telehealth (HOSPITAL_COMMUNITY): Payer: Self-pay | Admitting: Interventional Radiology

## 2014-02-13 NOTE — Telephone Encounter (Signed)
Called pt, left VM for him to call and schedule his US carotid f/u study. JM

## 2014-03-10 DIAGNOSIS — M1712 Unilateral primary osteoarthritis, left knee: Secondary | ICD-10-CM | POA: Insufficient documentation

## 2014-04-28 ENCOUNTER — Encounter: Payer: Self-pay | Admitting: Nurse Practitioner

## 2014-04-28 ENCOUNTER — Ambulatory Visit (INDEPENDENT_AMBULATORY_CARE_PROVIDER_SITE_OTHER): Payer: Medicare HMO | Admitting: Nurse Practitioner

## 2014-04-28 VITALS — BP 128/62 | HR 45 | Ht 72.0 in | Wt 252.8 lb

## 2014-04-28 DIAGNOSIS — I6529 Occlusion and stenosis of unspecified carotid artery: Secondary | ICD-10-CM

## 2014-04-28 NOTE — Patient Instructions (Addendum)
Continue Plavix for secondary stroke prevention and strict control of hypertension with blood pressure goal below 130/90 and lipids with LDL cholesterol goal below 100 mg percent.  Followup carotid dopplers in December.  Return for followup in 6 months with Dr. Leonie Man or call earlier if necessary.   Stroke Prevention Some medical conditions and behaviors are associated with an increased chance of having a stroke. You may prevent a stroke by making healthy choices and managing medical conditions. HOW CAN I REDUCE MY RISK OF HAVING A STROKE?   Stay physically active. Get at least 30 minutes of activity on most or all days.  Do not smoke. It may also be helpful to avoid exposure to secondhand smoke.  Limit alcohol use. Moderate alcohol use is considered to be:  No more than 2 drinks per day for men.  No more than 1 drink per day for nonpregnant women.  Eat healthy foods. This involves:  Eating 5 or more servings of fruits and vegetables a day.  Making dietary changes that address high blood pressure (hypertension), high cholesterol, diabetes, or obesity.  Manage your cholesterol levels.  Making food choices that are high in fiber and low in saturated fat, trans fat, and cholesterol may control cholesterol levels.  Take any prescribed medicines to control cholesterol as directed by your health care provider.  Manage your diabetes.  Controlling your carbohydrate and sugar intake is recommended to manage diabetes.  Take any prescribed medicines to control diabetes as directed by your health care provider.  Control your hypertension.  Making food choices that are low in salt (sodium), saturated fat, trans fat, and cholesterol is recommended to manage hypertension.  Take any prescribed medicines to control hypertension as directed by your health care provider.  Maintain a healthy weight.  Reducing calorie intake and making food choices that are low in sodium, saturated fat, trans  fat, and cholesterol are recommended to manage weight.  Stop drug abuse.  Avoid taking birth control pills.  Talk to your health care provider about the risks of taking birth control pills if you are over 63 years old, smoke, get migraines, or have ever had a blood clot.  Get evaluated for sleep disorders (sleep apnea).  Talk to your health care provider about getting a sleep evaluation if you snore a lot or have excessive sleepiness.  Take medicines only as directed by your health care provider.  For some people, aspirin or blood thinners (anticoagulants) are helpful in reducing the risk of forming abnormal blood clots that can lead to stroke. If you have the irregular heart rhythm of atrial fibrillation, you should be on a blood thinner unless there is a good reason you cannot take them.  Understand all your medicine instructions.  Make sure that other conditions (such as anemia or atherosclerosis) are addressed. SEEK IMMEDIATE MEDICAL CARE IF:   You have sudden weakness or numbness of the face, arm, or leg, especially on one side of the body.  Your face or eyelid droops to one side.  You have sudden confusion.  You have trouble speaking (aphasia) or understanding.  You have sudden trouble seeing in one or both eyes.  You have sudden trouble walking.  You have dizziness.  You have a loss of balance or coordination.  You have a sudden, severe headache with no known cause.  You have new chest pain or an irregular heartbeat. Any of these symptoms may represent a serious problem that is an emergency. Do not wait to see  if the symptoms will go away. Get medical help at once. Call your local emergency services (911 in [place] Do not drive yourself to the hospital. Document Released: 08/24/2004 Document Revised: 12/01/2013 Document Reviewed: 01/17/2013 [facility] Patient Information 2015 [place], [place]. This information is not intended to replace advice given to you by your health  care provider. Make sure you discuss any questions you have with your health care provider.

## 2014-04-28 NOTE — Progress Notes (Signed)
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 Vascular risk factors of hypertension, smoking, coronary artery disease and carotid disease.   He returns for followup of the last visit on 10/24/13 with me.  He continues to have occasional speech, word finding difficulties as well her right-sided weakness particularly when tired. He feels that his decreased energy and overall strength particularly in the right grip when he can dropped objects at times. He had started going to the gym but had to discontinue because of arthralgias and muscle aches. On Lipitor and he has reduced his dose from 80 to 40 mg daily. He plans to see his family physician next month to have followup lipid profile checked. He had repeat carotid ultrasound done in my office on 09/05/12 which showed 50-69% Rt ICA stenosis which was stable and subsequently underwent a diagnostic cerebral angiogram with Dr.Deveshwar On 09/20/12 which showed 30% intrastent stenosis on left and 50-60 % RICA stenosis. His last carotid ultrasound was uncharged, done in December 2014. He also decided not to discontinue aspirin but has reduced the dose to 81 mg daily. He does complain of bruising in his skin. He has no new neurological symptoms.   ROS:  14 system review of systems is positive for blurred vision, shortness of breath, weakness, difficulty urinating, bruise easily, speech difficulty.  ALLERGIES: No Known Allergies  HOME MEDICATIONS: Outpatient Prescriptions Prior to Visit  Medication Sig Dispense Refill  .  albuterol (PROVENTIL HFA;VENTOLIN HFA) 108 (90 BASE) MCG/ACT inhaler Inhale 2 puffs into the lungs every 6 (six) hours as needed for wheezing or shortness of breath. For shortness of breath      . budesonide-formoterol (SYMBICORT) 160-4.5 MCG/ACT inhaler Inhale 2 puffs into the lungs 2 (two) times daily.      . cholecalciferol (VITAMIN D) 400 UNITS TABS Take 400 Units by mouth 2 (two) times daily.      . citalopram (CELEXA) 20 MG tablet Take 20 mg by mouth every morning.       . clopidogrel (PLAVIX) 75 MG tablet Take 75 mg by mouth every morning.      . Coenzyme Q10 (CO Q-10) 200 MG CAPS Take 1 capsule by mouth daily.      Marland Kitchen gabapentin (NEURONTIN) 300 MG capsule Take 300 mg by mouth daily.      . lansoprazole (PREVACID) 30 MG capsule Take 30 mg by mouth at bedtime.      Marland Kitchen levothyroxine (SYNTHROID, LEVOTHROID) 75 MCG tablet Take 100 mcg by mouth daily before breakfast.       . losartan (COZAAR) 50 MG tablet Take 50 mg by mouth at bedtime.       . metFORMIN (GLUCOPHAGE) 500 MG tablet Take 1 tablet by mouth daily.      . Multiple Vitamin (MULTIVITAMIN WITH MINERALS) TABS Take 0.5 tablets by mouth 2 (two) times daily.      Marland Kitchen neomycin-bacitracin-polymyxin (NEOSPORIN) ointment Apply 1 application topically daily as needed (for cuts). apply to eye      . Omega-3 Fatty Acids (FISH OIL) 1200 MG CAPS Take 1,000 mg by mouth  2 (two) times daily.      Marland Kitchen omeprazole (PRILOSEC) 20 MG capsule Take 20 capsules by mouth daily.      . pravastatin (PRAVACHOL) 80 MG tablet Take 40 mg by mouth at bedtime.       . Tamsulosin HCl (FLOMAX) 0.4 MG CAPS Take 0.4 mg by mouth every morning.        No facility-administered medications prior to visit.    PHYSICAL EXAM Filed Vitals:   04/28/14 1408  Weight: 252 lb 12.8 oz (114.669 kg)   Body mass index is 34.28 kg/(m^2).  Physical Exam  General: well developed, well nourished, seated, in no evident distress  Head: head normocephalic and atraumatic. Orohparynx benign    Neck: supple with no carotid or supraclavicular bruits  Cardiovascular: regular rate and rhythm, no murmurs  Musculoskeletal: no deformity  Skin: no rash but scattered petichiae  Vascular: Normal pulses all extremities   Neurologic Exam  Mental Status: Awake and fully alert. Oriented to place and time. Recent and remote memory intact. Attention span, concentration and fund of knowledge appropriate. Mood and affect appropriate. Occasional disfluency and paraphasic errors.  Cranial Nerves: Fundoscopic exam not done.  Pupils equal, briskly reactive to light. Extraocular movements full without nystagmus. Visual fields full to confrontation. Hearing intact. Facial sensation intact. Face, tongue, palate moves normally and symmetrically.  Motor: Normal bulk and tone. Normal strength in all tested extremity muscles. Diminished fine finger movements on the right. Orbits left over right upper extremity. Mild weakness of right grip.  Sensory: intact to touch and pinprick and vibratory sensation.  Coordination: Rapid alternating movements normal in all extremities. Finger-to-nose and heel-to-shin performed accurately bilaterally.  Gait and Station: Arises from chair without difficulty. Stance is normal. Gait demonstrates normal stride length and balance . Unable to heel, toe and tandem walk without difficulty.  Reflexes: 1+ and symmetric.  ASSESSMENT: 17 year Caucasian male with a left MCA infarct in October 2013 due to left M1 occlusion from possible moderate left ICA stenosis treated with IV t-PA as well as mechanical embolectomy with Trevo Provue retrieval device with full recanalization and proximal left ICA rescue stent. Minimum residual expressive aphasia and very mild right hemiparesis persists. Vascular risk factors of hypertension, smoking, coronary artery disease and carotid disease.   PLAN:  Continue Plavix for secondary stroke prevention and strict control of hypertension with blood pressure  goal below 130/90 and lipids with LDL cholesterol goal below 100 mg percent.  Return for followup in 6 months with Dr. Leonie Man or call earlier if necessary.   Rudi Rummage Avantae Bither, MSN, FNP-BC, A/GNP-C 04/28/2014, 2:09 PM [facility] [address] [place], [place] [address] [phone]  Note: This document was prepared with digital dictation and possible smart phrase technology. Any transcriptional errors that result from this process are unintentional.

## 2014-05-03 NOTE — Progress Notes (Signed)
I agree with the above plan 

## 2014-07-01 ENCOUNTER — Ambulatory Visit (INDEPENDENT_AMBULATORY_CARE_PROVIDER_SITE_OTHER): Payer: Medicare HMO

## 2014-07-01 DIAGNOSIS — I6529 Occlusion and stenosis of unspecified carotid artery: Secondary | ICD-10-CM

## 2014-07-01 DIAGNOSIS — I6523 Occlusion and stenosis of bilateral carotid arteries: Secondary | ICD-10-CM

## 2014-07-21 ENCOUNTER — Telehealth (HOSPITAL_COMMUNITY): Payer: Self-pay | Admitting: Interventional Radiology

## 2014-07-21 ENCOUNTER — Other Ambulatory Visit (HOSPITAL_COMMUNITY): Payer: Self-pay | Admitting: Interventional Radiology

## 2014-07-21 DIAGNOSIS — I771 Stricture of artery: Secondary | ICD-10-CM

## 2014-07-21 NOTE — Telephone Encounter (Signed)
Called pt, left VM that Deveshwar wants him to come in for an office visit following his recent Doppler study. JM

## 2014-08-12 DIAGNOSIS — N210 Calculus in bladder: Secondary | ICD-10-CM | POA: Insufficient documentation

## 2014-08-12 DIAGNOSIS — N132 Hydronephrosis with renal and ureteral calculous obstruction: Secondary | ICD-10-CM | POA: Insufficient documentation

## 2014-08-12 DIAGNOSIS — N201 Calculus of ureter: Secondary | ICD-10-CM | POA: Insufficient documentation

## 2014-08-12 DIAGNOSIS — N23 Unspecified renal colic: Secondary | ICD-10-CM | POA: Insufficient documentation

## 2014-08-13 ENCOUNTER — Ambulatory Visit (HOSPITAL_COMMUNITY)
Admission: RE | Admit: 2014-08-13 | Discharge: 2014-08-13 | Disposition: A | Discharge status: Home / Self Care | Payer: Medicare HMO | Source: Ambulatory Visit | Attending: Interventional Radiology | Admitting: Interventional Radiology

## 2014-08-13 DIAGNOSIS — R4701 Aphasia: Secondary | ICD-10-CM | POA: Diagnosis present

## 2014-08-13 DIAGNOSIS — I771 Stricture of artery: Secondary | ICD-10-CM

## 2014-08-13 LAB — PLATELET INHIBITION P2Y12: PLATELET FUNCTION P2Y12: 187 [PRU] — AB (ref 194–418)

## 2014-08-14 ENCOUNTER — Telehealth (HOSPITAL_COMMUNITY): Payer: Self-pay | Admitting: Interventional Radiology

## 2014-08-14 NOTE — Telephone Encounter (Signed)
Called pt's wife, told her per Deveshwar that patient should change his Plavix 75mg  1 QD to Plavix 75mg  1/2 tablet QD and continue as is on Aspirin 81mg  1 QD. She states understanding and is in agreement with this plan. JM

## 2014-09-07 DIAGNOSIS — I70209 Unspecified atherosclerosis of native arteries of extremities, unspecified extremity: Secondary | ICD-10-CM | POA: Insufficient documentation

## 2014-11-11 ENCOUNTER — Ambulatory Visit: Payer: Medicare HMO | Admitting: Neurology

## 2014-11-20 NOTE — Op Note (Signed)
PATIENT NAME:  Paul Bass, Paul Bass MR#:  [id number] DATE OF BIRTH:  1945/08/22  DATE OF PROCEDURE:  01/14/2013  PREOPERATIVE DIAGNOSIS: Visually significant cataract of the right eye.   POSTOPERATIVE DIAGNOSIS: Visually significant cataract of the right eye.   OPERATIVE PROCEDURE: Cataract extraction by phacoemulsification with implant of intraocular lens to right eye.   SURGEON: Birder Robson, MD.   ANESTHESIA:  1. Managed anesthesia care.  2. Topical tetracaine drops followed by 2% Xylocaine jelly applied in the preoperative holding area.   COMPLICATIONS: None.   TECHNIQUE:  Stop and chop.   DESCRIPTION OF PROCEDURE: The patient was examined and consented in the preoperative holding area where the aforementioned topical anesthesia was applied to the right eye and then brought back to the Operating Room where the right eye was prepped and draped in the usual sterile ophthalmic fashion and a lid speculum was placed. A paracentesis was created with the side port blade and the anterior chamber was filled with viscoelastic. A near clear corneal incision was performed with the steel keratome. A continuous curvilinear capsulorrhexis was performed with a cystotome followed by the capsulorrhexis forceps. Hydrodissection and hydrodelineation were carried out with BSS on a blunt cannula. The lens was removed in a stop and chop technique and the remaining cortical material was removed with the irrigation-aspiration handpiece. The capsular bag was inflated with viscoelastic and the Tecnis ZCB00 is 19.0-diopter lens, serial number [id number] was placed in the capsular bag without complication. The remaining viscoelastic was removed from the eye with the irrigation-aspiration handpiece. The wounds were hydrated. The anterior chamber was flushed with Miostat and the eye was inflated to physiologic pressure. 0.1 mL of cefuroxime concentration 10 mg/mL was placed in the anterior chamber. The wounds were found to be  water tight. The eye was dressed with Vigamox. The patient was given protective glasses to wear throughout the day and a shield with which to sleep tonight. The patient was also given drops with which to begin a drop regimen today and will follow-up with me in one day.    ____________________________ Livingston Diones. Kae Lauman, MD wlp:rw D: 01/14/2013 14:00:07 ET T: 01/14/2013 14:15:30 ET JOB#: [id number]  cc: Halston Kintz L. Keyonni Percival, MD, <Dictator> Livingston Diones Luvenia Cranford MD ELECTRONICALLY SIGNED 01/16/2013 14:40

## 2015-01-21 ENCOUNTER — Other Ambulatory Visit (HOSPITAL_COMMUNITY): Payer: Self-pay | Admitting: Interventional Radiology

## 2015-02-15 DIAGNOSIS — N200 Calculus of kidney: Secondary | ICD-10-CM | POA: Insufficient documentation

## 2015-02-16 ENCOUNTER — Encounter: Payer: Self-pay | Admitting: Neurology

## 2015-02-16 ENCOUNTER — Ambulatory Visit (INDEPENDENT_AMBULATORY_CARE_PROVIDER_SITE_OTHER): Payer: Medicare HMO | Admitting: Neurology

## 2015-02-16 VITALS — BP 134/66 | HR 42 | Ht 72.0 in | Wt 245.0 lb

## 2015-02-16 DIAGNOSIS — I6523 Occlusion and stenosis of bilateral carotid arteries: Secondary | ICD-10-CM | POA: Diagnosis not present

## 2015-02-16 NOTE — Patient Instructions (Addendum)
I had a long d/w patient and his wife about his remote stroke, risk for recurrent stroke/TIAs, personally independently reviewed imaging studies and stroke evaluation results and answered questions.Continue aspirin 81 mg orally every day  for secondary stroke prevention and maintain strict control of hypertension with blood pressure goal below 130/90, diabetes with hemoglobin A1c goal below 6.5% and lipids with LDL cholesterol goal below 100 mg/dL. I also advised the patient to eat a healthy diet with plenty of whole grains, cereals, fruits and vegetables, exercise regularly and maintain ideal body weight .check follow-up carotid ultrasound study to follow his left ICA stent restenosis.. I also advised him to discuss discontinuing Plavix with his cardiologist as it has been more than 10 years since his cardiac stent. This was a 25 minute visit with more than 50% of the time spent in counseling and coordination of care. Followup in the future with me in  1 year or call earlier if necessary

## 2015-02-16 NOTE — Progress Notes (Signed)
PATIENT: Paul Bass DOB: 03/12/1946  REASON FOR VISIT: routine follow up for stroke HISTORY FROM: patient  HISTORY OF PRESENT ILLNESS: 30 year Caucasian male with a left MCA infarct in October 2013 due to left M1 occlusion from possible moderate left ICA stenosis treated with IV t-PA as well as mechanical embolectomy with Trevo Provue retrieval device with full recanalization and proximal left ICA rescue stent. 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 Vascular risk factors of hypertension, smoking, coronary artery disease and carotid disease.   He returns for followup of the last visit on 10/24/13 with me.  He continues to have occasional speech, word finding difficulties as well her right-sided weakness particularly when tired. He feels that his decreased energy and overall strength particularly in the right grip when he can dropped objects at times. He had started going to the gym but had to discontinue because of arthralgias and muscle aches. On Lipitor and he has reduced his dose from 80 to 40 mg daily. He plans to see his family physician next month to have followup lipid profile checked. He had repeat carotid ultrasound done in my office on 09/05/12 which showed 50-69% Rt ICA stenosis which was stable and subsequently underwent a diagnostic cerebral angiogram with Dr.Deveshwar On 09/20/12 which showed 30% intrastent stenosis on left and 50-60 % RICA stenosis. His last carotid ultrasound was uncharged, done in December 2014. He also decided not to discontinue aspirin but has reduced the dose to 81 mg daily. He does complain of bruising in his skin. He has no new neurological symptoms.  Update 02/16/2015 : He returns for follow-up after last visit to 9 months ago. Continues to do well without recurrent stroke or TIA symptoms. He remains on aspirin which is tolerating well with only minor bruising and no bleeding episodes. His blood pressure is well controlled and it is  134/64 in office today. He states his fasting sugars range from 110 to 130s and last hemoglobin A1c was 6.2. His last lipid profile was also fine. He had follow-up carotid ultrasound done on 07/01/14 which I personally reviewed and shows bilateral 50-69% left ICA stenosis. He continues to have mild word finding difficulties and occasional aphasic errors and mild balance difficulties but these are unchanged. He is also had some mild short-term memory difficulties which have progressed since his stroke. ROS:  14 system review of systems is positive for  , memory loss,shortness of breath, , difficulty urinating, bruise easily, speech difficulty.  ALLERGIES: No Known Allergies  HOME MEDICATIONS: Outpatient Prescriptions Prior to Visit  Medication Sig Dispense Refill  . albuterol (PROVENTIL HFA;VENTOLIN HFA) 108 (90 BASE) MCG/ACT inhaler Inhale 2 puffs into the lungs every 6 (six) hours as needed for wheezing or shortness of breath. For shortness of breath    . aspirin 81 MG chewable tablet Chew by mouth.    . budesonide-formoterol (SYMBICORT) 160-4.5 MCG/ACT inhaler Inhale 2 puffs into the lungs 2 (two) times daily.    . cholecalciferol (VITAMIN D) 400 UNITS TABS Take 400 Units by mouth 2 (two) times daily.    . Coenzyme Q10 (CO Q-10) 200 MG CAPS Take 1 capsule by mouth daily.    . Lancets MISC Use 1 each 3 (three) times daily. Use as instructed.    Marland Kitchen losartan (COZAAR) 50 MG tablet Take 50 mg by mouth at bedtime.     . Multiple Vitamin (MULTIVITAMIN WITH MINERALS) TABS Take 0.5 tablets by mouth 2 (two) times daily.    Marland Kitchen  neomycin-bacitracin-polymyxin (NEOSPORIN) ointment Apply 1 application topically daily as needed (for cuts). apply to eye    . Omega-3 Fatty Acids (FISH OIL) 1200 MG CAPS Take 1,000 mg by mouth 2 (two) times daily.    . pravastatin (PRAVACHOL) 80 MG tablet Take 40 mg by mouth at bedtime.     . gabapentin (NEURONTIN) 300 MG capsule Take 300 mg by mouth daily.    . citalopram (CELEXA)  20 MG tablet Take 20 mg by mouth every morning.     . clonazePAM (KLONOPIN) 0.5 MG tablet Take by mouth.    . clopidogrel (PLAVIX) 75 MG tablet Take 75 mg by mouth every morning.    Marland Kitchen glucose blood ([facility] BLOOD GLUCOSE TEST STRIPS) test strip Use 3 (three) times daily. Use as instructed.    Marland Kitchen levothyroxine (SYNTHROID, LEVOTHROID) 75 MCG tablet Take 100 mcg by mouth daily before breakfast.     . metFORMIN (GLUCOPHAGE) 500 MG tablet Take 1 tablet by mouth daily.    Marland Kitchen omeprazole (PRILOSEC) 20 MG capsule Take 20 capsules by mouth daily.    . Tamsulosin HCl (FLOMAX) 0.4 MG CAPS Take 0.4 mg by mouth every morning.      No facility-administered medications prior to visit.    PHYSICAL EXAM Filed Vitals:   02/16/15 1047  BP: 134/66  Pulse: 42  Height: 6' (1.829 m)  Weight: 245 lb (111.131 kg)   Body mass index is 33.22 kg/(m^2).  Physical Exam  General: well developed, well nourished mildly obese Caucasian male, seated, in no evident distress  Head: head normocephalic and atraumatic.    Neck: supple with no carotid or supraclavicular bruits  Cardiovascular: regular rate and rhythm, no murmurs  Respiratory : scattered rhonchi bilaterally Musculoskeletal: no deformity  Skin: no rash but scattered petichiae  Vascular: Normal pulses all extremities   Neurologic Exam  Mental Status: Awake and fully alert. Oriented to place and time. Recent and remote memory intact. Slightly diminished recall 2/3. Attention span, concentration and fund of knowledge appropriate. Mood and affect appropriate. Occasional disfluency and paraphasic errors. Mild nonfluent speech. Cranial Nerves: Fundoscopic exam not done.  Pupils equal, briskly reactive to light. Extraocular movements full without nystagmus. Visual fields full to confrontation. Hearing intact. Facial sensation intact. Face, tongue, palate moves normally and symmetrically.  Motor: Normal bulk and tone. Normal strength in all tested extremity muscles.  Diminished fine finger movements on the right. Orbits left over right upper extremity. Mild weakness of right grip.  Sensory: intact to touch and pinprick and vibratory sensation.  Coordination: Rapid alternating movements normal in all extremities. Finger-to-nose and heel-to-shin performed accurately bilaterally.  Gait and Station: Arises from chair without difficulty. Stance is normal. Gait demonstrates normal stride length and balance . Unable to heel, toe and tandem walk without difficulty.  Reflexes: 1+ and symmetric.  ASSESSMENT: 85 year Caucasian male with a left MCA infarct in October 2013 due to left M1 occlusion from possible moderate left ICA stenosis treated with IV t-PA as well as mechanical embolectomy with Trevo Provue retrieval device with full recanalization and proximal left ICA rescue stent. Minimum residual expressive aphasia and very mild right hemiparesis persists. Vascular risk factors of hypertension, smoking, coronary artery disease and carotid disease.   PLAN:  I had a long d/w patient and his wife about his remote stroke, risk for recurrent stroke/TIAs, personally independently reviewed imaging studies and stroke evaluation results and answered questions.Continue aspirin 81 mg orally every day  for secondary stroke prevention and maintain  strict control of hypertension with blood pressure goal below 130/90, diabetes with hemoglobin A1c goal below 6.5% and lipids with LDL cholesterol goal below 100 mg/dL. I also advised the patient to eat a healthy diet with plenty of whole grains, cereals, fruits and vegetables, exercise regularly and maintain ideal body weight .check follow-up carotid ultrasound study to follow his left ICA stent restenosis.. I also advised him to discuss discontinuing Plavix with his cardiologist as it has been more than 10 years since his cardiac stent. This was a 25 minute visit with more than 50% of the time spent in counseling and coordination of care.  Followup in the future with me in  1 year or call earlier if necessary   Antony Contras, MD  02/16/2015, 12:49 PM [facility] [address], [place], [place] [address] [phone]  Note: This document was prepared with digital dictation and possible smart phrase technology. Any transcriptional errors that result from this process are unintentional.

## 2015-02-24 ENCOUNTER — Ambulatory Visit (INDEPENDENT_AMBULATORY_CARE_PROVIDER_SITE_OTHER): Payer: Medicare HMO

## 2015-02-24 DIAGNOSIS — I6523 Occlusion and stenosis of bilateral carotid arteries: Secondary | ICD-10-CM | POA: Diagnosis not present

## 2015-03-01 NOTE — Progress Notes (Signed)
Preliminary Carotid Duplex Study   Final Report can be found under Media tab   Physician Impression: This study is consistent with 50-69% stenosis of the right proximal ICA, right bulb as well as stenosis involving the left ECA.  An abnormally elevated BFVs seen in the left prox/mid/distal ICA and consistent with 50-60% of stenosis, but this finding was not supported by presence of plaques.  Acoustic Shadow plaques were presented on the right and moderate wall thickenings were noted bilaterally.

## 2015-05-27 DIAGNOSIS — I10 Essential (primary) hypertension: Secondary | ICD-10-CM | POA: Insufficient documentation

## 2015-06-01 ENCOUNTER — Telehealth: Payer: Self-pay | Admitting: Neurology

## 2015-06-01 NOTE — Telephone Encounter (Signed)
Rn call patient back about her husband having a angiogram with Dr. Estanislado Pandy. Rn stated per the patients chart, there is no order for a schedule angiogram. Rn stated a call will be made to the office for more clarification.

## 2015-06-01 NOTE — Telephone Encounter (Signed)
Rn call patients wife Vaughan Basta back about the clarification on the angiogram test. Rn stated Caryl Pina left for today, and a call will be made in the am for more clarification.

## 2015-06-01 NOTE — Telephone Encounter (Signed)
Call  Dr. Estanislado Pandy office to talk to [place]. Caryl Pina had left for the day. Rn will call [phone], or [phone] to get more clarification on the order on Wednesday.

## 2015-06-01 NOTE — Telephone Encounter (Signed)
Wife called, states they received a call from Ashley/Dr. Estanislado Pandy wanting to schedule angiogram that he was being referred by Dr. Leonie Man. Wife doesn't know if patient needs this or why they are calling now? Wife would like for someone to call them to advise.

## 2015-06-02 NOTE — Telephone Encounter (Signed)
RN call wife back who is on DPR form to give number of [phone] to discuss angiogram for husband and ask questions.

## 2015-06-02 NOTE — Telephone Encounter (Signed)
LF Vm for patient to call back concerning her husbands angiogram

## 2015-06-02 NOTE — Telephone Encounter (Signed)
Rn call Dr. Estanislado Pandy office at [phone] to speak with Caryl Pina. Caryl Pina stated patient had a carotid ultrasound couple of months ago. Also Dr. Allegra Grana wanted a update angiogram to get a clear pictures of the results from the carotid ultrasound. Caryl Pina stated she has not schedule patient because of prior authorizations for insurance. Rn stated she will call wife.

## 2015-06-02 NOTE — Telephone Encounter (Signed)
LFt vm for Jennifer at [phone] at [facility] office concerning patient having angiogram. Rn stated on vm that no orders are listed for patient to have test. Request call back for more clarification.

## 2015-06-03 ENCOUNTER — Telehealth (HOSPITAL_COMMUNITY): Payer: Self-pay

## 2015-06-03 ENCOUNTER — Other Ambulatory Visit (HOSPITAL_COMMUNITY): Payer: Self-pay | Admitting: Interventional Radiology

## 2015-06-03 DIAGNOSIS — I771 Stricture of artery: Secondary | ICD-10-CM

## 2015-06-03 NOTE — Telephone Encounter (Signed)
Called pt's wife to schedule f/u angio, left message for her to call back and schedule. AW

## 2015-06-30 ENCOUNTER — Other Ambulatory Visit: Payer: Self-pay | Admitting: Radiology

## 2015-07-01 ENCOUNTER — Ambulatory Visit (HOSPITAL_COMMUNITY)
Admission: RE | Admit: 2015-07-01 | Discharge: 2015-07-01 | Disposition: A | Discharge status: Home / Self Care | Payer: Medicare HMO | Source: Ambulatory Visit | Attending: Interventional Radiology | Admitting: Interventional Radiology

## 2015-07-01 ENCOUNTER — Other Ambulatory Visit (HOSPITAL_COMMUNITY): Payer: Self-pay | Admitting: Interventional Radiology

## 2015-07-01 DIAGNOSIS — I10 Essential (primary) hypertension: Secondary | ICD-10-CM | POA: Diagnosis not present

## 2015-07-01 DIAGNOSIS — Z48812 Encounter for surgical aftercare following surgery on the circulatory system: Secondary | ICD-10-CM | POA: Diagnosis not present

## 2015-07-01 DIAGNOSIS — F329 Major depressive disorder, single episode, unspecified: Secondary | ICD-10-CM | POA: Insufficient documentation

## 2015-07-01 DIAGNOSIS — E039 Hypothyroidism, unspecified: Secondary | ICD-10-CM | POA: Insufficient documentation

## 2015-07-01 DIAGNOSIS — Z8673 Personal history of transient ischemic attack (TIA), and cerebral infarction without residual deficits: Secondary | ICD-10-CM | POA: Diagnosis not present

## 2015-07-01 DIAGNOSIS — N400 Enlarged prostate without lower urinary tract symptoms: Secondary | ICD-10-CM | POA: Diagnosis not present

## 2015-07-01 DIAGNOSIS — Z87891 Personal history of nicotine dependence: Secondary | ICD-10-CM | POA: Insufficient documentation

## 2015-07-01 DIAGNOSIS — I6602 Occlusion and stenosis of left middle cerebral artery: Secondary | ICD-10-CM | POA: Diagnosis not present

## 2015-07-01 DIAGNOSIS — I2510 Atherosclerotic heart disease of native coronary artery without angina pectoris: Secondary | ICD-10-CM | POA: Insufficient documentation

## 2015-07-01 DIAGNOSIS — E785 Hyperlipidemia, unspecified: Secondary | ICD-10-CM | POA: Diagnosis not present

## 2015-07-01 DIAGNOSIS — I771 Stricture of artery: Secondary | ICD-10-CM

## 2015-07-01 DIAGNOSIS — Z955 Presence of coronary angioplasty implant and graft: Secondary | ICD-10-CM | POA: Diagnosis not present

## 2015-07-01 DIAGNOSIS — Z7902 Long term (current) use of antithrombotics/antiplatelets: Secondary | ICD-10-CM | POA: Diagnosis not present

## 2015-07-01 DIAGNOSIS — I6523 Occlusion and stenosis of bilateral carotid arteries: Secondary | ICD-10-CM | POA: Diagnosis not present

## 2015-07-01 DIAGNOSIS — J449 Chronic obstructive pulmonary disease, unspecified: Secondary | ICD-10-CM | POA: Diagnosis not present

## 2015-07-01 DIAGNOSIS — F419 Anxiety disorder, unspecified: Secondary | ICD-10-CM | POA: Diagnosis not present

## 2015-07-01 LAB — GLUCOSE, CAPILLARY
GLUCOSE-CAPILLARY: 128 mg/dL — AB (ref 65–99)
Glucose-Capillary: 162 mg/dL — ABNORMAL HIGH (ref 65–99)

## 2015-07-01 LAB — BASIC METABOLIC PANEL
Anion gap: 8 (ref 5–15)
BUN: 14 mg/dL (ref 6–20)
CALCIUM: 9.2 mg/dL (ref 8.9–10.3)
CO2: 25 mmol/L (ref 22–32)
CREATININE: 1.15 mg/dL (ref 0.61–1.24)
Chloride: 103 mmol/L (ref 101–111)
GFR calc Af Amer: >60 mL/min (ref >60)
Glucose, Bld: 176 mg/dL — ABNORMAL HIGH (ref 65–99)
POTASSIUM: 4.3 mmol/L (ref 3.5–5.1)
SODIUM: 136 mmol/L (ref 135–145)

## 2015-07-01 LAB — PROTIME-INR
INR: 1.06 (ref 0.00–1.49)
PROTHROMBIN TIME: 14 s (ref 11.6–15.2)

## 2015-07-01 LAB — APTT: APTT: 32 s (ref 24–37)

## 2015-07-01 LAB — CBC
HEMATOCRIT: 41.6 % (ref 39.0–52.0)
Hemoglobin: 14.5 g/dL (ref 13.0–17.0)
MCH: 32.4 pg (ref 26.0–34.0)
MCHC: 34.9 g/dL (ref 30.0–36.0)
MCV: 92.9 fL (ref 78.0–100.0)
PLATELETS: 122 10*3/uL — AB (ref 150–400)
RBC: 4.48 MIL/uL (ref 4.22–5.81)
RDW: 13 % (ref 11.5–15.5)
WBC: 4.7 10*3/uL (ref 4.0–10.5)

## 2015-07-01 MED ORDER — LIDOCAINE HCL 1 % IJ SOLN
INTRAMUSCULAR | Status: AC
  Filled 2015-07-01: qty 20

## 2015-07-01 MED ORDER — MIDAZOLAM HCL 2 MG/2ML IJ SOLN
INTRAMUSCULAR | Status: AC
  Filled 2015-07-01: qty 4

## 2015-07-01 MED ORDER — SODIUM CHLORIDE 0.9 % IV SOLN: INTRAVENOUS | Status: AC

## 2015-07-01 MED ORDER — HEPARIN SOD (PORK) LOCK FLUSH 100 UNIT/ML IV SOLN
INTRAVENOUS | Status: AC
Start: 2015-07-01 — End: 2015-07-01
  Filled 2015-07-01: qty 20

## 2015-07-01 MED ORDER — FENTANYL CITRATE (PF) 100 MCG/2ML IJ SOLN
INTRAMUSCULAR | Status: AC | PRN
  Administered 2015-07-01: 25 ug via INTRAVENOUS

## 2015-07-01 MED ORDER — HEPARIN SOD (PORK) LOCK FLUSH 100 UNIT/ML IV SOLN
INTRAVENOUS | Status: AC | PRN
  Administered 2015-07-01: 1000 [IU] via INTRAVENOUS
  Administered 2015-07-01: 500 [IU] via INTRAVENOUS

## 2015-07-01 MED ORDER — SODIUM CHLORIDE 0.9 % IV SOLN
INTRAVENOUS | Status: DC
  Administered 2015-07-01: 09:00:00 via INTRAVENOUS

## 2015-07-01 MED ORDER — IOHEXOL 300 MG/ML  SOLN
150.0000 mL | Freq: Once | INTRAMUSCULAR | Status: AC | PRN
  Administered 2015-07-01: 70 mL via INTRAVENOUS

## 2015-07-01 MED ORDER — FENTANYL CITRATE (PF) 100 MCG/2ML IJ SOLN
INTRAMUSCULAR | Status: AC
  Filled 2015-07-01: qty 4

## 2015-07-01 MED ORDER — MIDAZOLAM HCL 2 MG/2ML IJ SOLN
INTRAMUSCULAR | Status: AC | PRN
  Administered 2015-07-01: 1 mg via INTRAVENOUS

## 2015-07-01 NOTE — H&P (Signed)
Chief Complaint: "I'm here for an angiogram"  HPI: Paul Bass is an 69 y.o. male  Pt with prior hx of CVA and revascularization procedure in 2013. Has known carotid stenosis and vertebral artery stenosis. Has been taking Plavix daily Has Duplex studies every 6 months, his last showed >60% stenosis of (B)ICA He is here today for formal arteriogram Has otherwise been well, no new CVA, TIA sxs. Has been NPO this am  Past Medical History:  Past Medical History  Diagnosis Date  . HTN (hypertension)   . CAD S/P percutaneous coronary angioplasty   . Hyperlipidemia   . COPD (chronic obstructive pulmonary disease)   . Tobacco abuse   . Hypothyroidism   . Depression   . Anxiety   . BPH (benign prostatic hypertrophy)     Past Surgical History:  Past Surgical History  Procedure Laterality Date  . 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 Rotator cuff replaced      right  . Pins r lower leg      Family History:  Family History  Problem Relation Age of Onset  . Heart failure Father   . Throat cancer Mother   . Diabetes Brother     Social History:  reports that he quit smoking about 3 years ago. His smoking use included Cigarettes. He smoked 2.00 packs per day. He has never used smokeless tobacco. He reports that he does not drink alcohol. His drug history is not on file.  Allergies: No Known Allergies  Medications:   Medication List    ASK your doctor about these medications        albuterol 108 (90 BASE) MCG/ACT inhaler  Commonly known as:  PROVENTIL HFA;VENTOLIN HFA  Inhale 2 puffs into the lungs every 6 (six) hours as needed for wheezing or shortness of breath. For shortness of breath     budesonide-formoterol 160-4.5 MCG/ACT inhaler  Commonly known as:  SYMBICORT  Inhale 2 puffs into the lungs 2 (two) times daily.     cholecalciferol 400 UNITS Tabs tablet  Commonly known as:  VITAMIN D  Take 400 Units by mouth 2 (two)  times daily.     citalopram 20 MG tablet  Commonly known as:  CELEXA  Take 20 mg by mouth daily.     clonazePAM 0.5 MG tablet  Commonly known as:  KLONOPIN  Take 0.5 mg by mouth 2 (two) times daily as needed for anxiety.     clopidogrel 75 MG tablet  Commonly known as:  PLAVIX  Take 37.5 mg by mouth daily.     Co Q-10 200 MG Caps  Take 1 capsule by mouth daily.     Fish Oil 1200 MG Caps  Take 1,000 mg by mouth 2 (two) times daily.     glucose blood test strip     Lancets Misc  Use 1 each 3 (three) times daily. Use as instructed.     levothyroxine 100 MCG tablet  Commonly known as:  SYNTHROID, LEVOTHROID  Take 100 mcg by mouth daily before breakfast.     losartan 50 MG tablet  Commonly known as:  COZAAR  Take 50 mg by mouth at bedtime.     metFORMIN 500 MG tablet  Commonly known as:  GLUCOPHAGE  TAKE 1 TABLET BY MOUTH ONCE A DAY     multivitamin with minerals Tabs tablet  Take 0.5 tablets by mouth 2 (two) times daily.  omeprazole 20 MG capsule  Commonly known as:  PRILOSEC  Take 20 mg by mouth daily.     pravastatin 80 MG tablet  Commonly known as:  PRAVACHOL  Take 40 mg by mouth at bedtime.     tamsulosin 0.4 MG Caps capsule  Commonly known as:  FLOMAX  Take 0.4 mg by mouth daily.        Please HPI for pertinent positives, otherwise complete 10 system ROS negative.  Physical Exam: BP 143/79 mmHg  Pulse 44  Temp(Src) 97.9 F (36.6 C) (Oral)  Resp 16  Ht 6' (1.829 m)  Wt 245 lb (111.131 kg)  BMI 33.22 kg/m2  SpO2 97% Body mass index is 33.22 kg/(m^2).   General Appearance:  Alert, cooperative, no distress, appears stated age  Head:  Normocephalic, without obvious abnormality, atraumatic  ENT: Unremarkable  Neck: Supple, symmetrical, trachea midline  Lungs:   Clear to auscultation bilaterally, no w/r/r, respirations unlabored without use of accessory muscles.  Chest Wall:  No tenderness or deformity  Heart:  Regular rate and rhythm, S1, S2  normal, no murmur, rub or gallop.  Extremities: Extremities normal, atraumatic, no cyanosis or edema  Pulses: 2+ and symmetric femoral pulses  Neurologic: Normal affect, no gross deficits.  Labs: Results for orders placed or performed during the hospital encounter of 07/01/15 (from the past 48 hour(s))  Glucose, capillary     Status: Abnormal   Collection Time: 07/01/15  8:06 AM  Result Value Ref Range   Glucose-Capillary 162 (H) 65 - 99 mg/dL    Imaging: No results found.  Assessment/Plan Hx CVA Known carotid and vertebral artery stenosis. Plan for carotid and cerebral arteriogram today Labs pending Risks and Benefits discussed with the patient including, but not limited to bleeding, infection, vascular injury or contrast induced renal failure. All of the patient's questions were answered, patient is agreeable to proceed. Consent signed and in chart.   Ascencion Dike PA-C 07/01/2015, 8:39 AM

## 2015-07-01 NOTE — Discharge Instructions (Signed)

## 2015-07-01 NOTE — Sedation Documentation (Signed)
Patient is resting comfortably. 

## 2015-07-01 NOTE — Sedation Documentation (Signed)
Patient denies pain and is resting comfortably.  

## 2015-07-01 NOTE — Procedures (Signed)
S/P 4 vessel cerebral arteriogram. RT CFA approach. Findings. 1.Approx 70 75% stenosis of dominant LT Vert Aorigin. 2.Approx 30 % stenosis within stented Lt ICA prox a .3.Approx 30 to 50 % stenosis of RT ICA prox

## 2015-11-29 DIAGNOSIS — I208 Other forms of angina pectoris: Secondary | ICD-10-CM | POA: Diagnosis present

## 2015-12-07 ENCOUNTER — Ambulatory Visit
Admission: RE | Admit: 2015-12-07 | Discharge: 2015-12-07 | Disposition: A | Discharge status: Home / Self Care | Payer: Medicare HMO | Source: Ambulatory Visit | Attending: Internal Medicine | Admitting: Internal Medicine

## 2015-12-07 ENCOUNTER — Encounter
Admission: RE | Disposition: A | Discharge status: Home / Self Care | Payer: Self-pay | Source: Ambulatory Visit | Attending: Internal Medicine

## 2015-12-07 ENCOUNTER — Encounter: Payer: Self-pay | Admitting: *Deleted

## 2015-12-07 DIAGNOSIS — Z79899 Other long term (current) drug therapy: Secondary | ICD-10-CM | POA: Diagnosis not present

## 2015-12-07 DIAGNOSIS — E785 Hyperlipidemia, unspecified: Secondary | ICD-10-CM | POA: Insufficient documentation

## 2015-12-07 DIAGNOSIS — E7800 Pure hypercholesterolemia, unspecified: Secondary | ICD-10-CM | POA: Diagnosis not present

## 2015-12-07 DIAGNOSIS — I25119 Atherosclerotic heart disease of native coronary artery with unspecified angina pectoris: Secondary | ICD-10-CM | POA: Diagnosis not present

## 2015-12-07 DIAGNOSIS — Z87442 Personal history of urinary calculi: Secondary | ICD-10-CM | POA: Diagnosis not present

## 2015-12-07 DIAGNOSIS — Z955 Presence of coronary angioplasty implant and graft: Secondary | ICD-10-CM | POA: Diagnosis not present

## 2015-12-07 DIAGNOSIS — E039 Hypothyroidism, unspecified: Secondary | ICD-10-CM | POA: Diagnosis not present

## 2015-12-07 DIAGNOSIS — N400 Enlarged prostate without lower urinary tract symptoms: Secondary | ICD-10-CM | POA: Diagnosis not present

## 2015-12-07 DIAGNOSIS — Z7951 Long term (current) use of inhaled steroids: Secondary | ICD-10-CM | POA: Insufficient documentation

## 2015-12-07 DIAGNOSIS — I10 Essential (primary) hypertension: Secondary | ICD-10-CM | POA: Diagnosis not present

## 2015-12-07 DIAGNOSIS — G4730 Sleep apnea, unspecified: Secondary | ICD-10-CM | POA: Diagnosis not present

## 2015-12-07 DIAGNOSIS — Z7984 Long term (current) use of oral hypoglycemic drugs: Secondary | ICD-10-CM | POA: Insufficient documentation

## 2015-12-07 DIAGNOSIS — F329 Major depressive disorder, single episode, unspecified: Secondary | ICD-10-CM | POA: Diagnosis not present

## 2015-12-07 DIAGNOSIS — Z9841 Cataract extraction status, right eye: Secondary | ICD-10-CM | POA: Diagnosis not present

## 2015-12-07 DIAGNOSIS — Z87891 Personal history of nicotine dependence: Secondary | ICD-10-CM | POA: Insufficient documentation

## 2015-12-07 DIAGNOSIS — I471 Supraventricular tachycardia: Secondary | ICD-10-CM | POA: Diagnosis not present

## 2015-12-07 DIAGNOSIS — Z833 Family history of diabetes mellitus: Secondary | ICD-10-CM | POA: Diagnosis not present

## 2015-12-07 DIAGNOSIS — E119 Type 2 diabetes mellitus without complications: Secondary | ICD-10-CM | POA: Diagnosis not present

## 2015-12-07 DIAGNOSIS — Z8249 Family history of ischemic heart disease and other diseases of the circulatory system: Secondary | ICD-10-CM | POA: Diagnosis not present

## 2015-12-07 DIAGNOSIS — Z8673 Personal history of transient ischemic attack (TIA), and cerebral infarction without residual deficits: Secondary | ICD-10-CM | POA: Diagnosis not present

## 2015-12-07 DIAGNOSIS — K219 Gastro-esophageal reflux disease without esophagitis: Secondary | ICD-10-CM | POA: Insufficient documentation

## 2015-12-07 DIAGNOSIS — I208 Other forms of angina pectoris: Secondary | ICD-10-CM | POA: Diagnosis present

## 2015-12-07 DIAGNOSIS — Z811 Family history of alcohol abuse and dependence: Secondary | ICD-10-CM | POA: Insufficient documentation

## 2015-12-07 DIAGNOSIS — Z7982 Long term (current) use of aspirin: Secondary | ICD-10-CM | POA: Diagnosis not present

## 2015-12-07 DIAGNOSIS — J449 Chronic obstructive pulmonary disease, unspecified: Secondary | ICD-10-CM | POA: Insufficient documentation

## 2015-12-07 DIAGNOSIS — Z86718 Personal history of other venous thrombosis and embolism: Secondary | ICD-10-CM | POA: Diagnosis not present

## 2015-12-07 DIAGNOSIS — R001 Bradycardia, unspecified: Secondary | ICD-10-CM | POA: Diagnosis present

## 2015-12-07 DIAGNOSIS — I2089 Other forms of angina pectoris: Secondary | ICD-10-CM | POA: Diagnosis present

## 2015-12-07 HISTORY — DX: Cerebral infarction, unspecified: I63.9

## 2015-12-07 HISTORY — PX: CARDIAC CATHETERIZATION: SHX172

## 2015-12-07 SURGERY — LEFT HEART CATH AND CORONARY ANGIOGRAPHY
Anesthesia: Moderate Sedation

## 2015-12-07 MED ORDER — SODIUM CHLORIDE 0.9 % IV SOLN: 250.0000 mL | INTRAVENOUS | Status: DC | PRN

## 2015-12-07 MED ORDER — SODIUM CHLORIDE 0.9 % IV SOLN: INTRAVENOUS | Status: DC

## 2015-12-07 MED ORDER — ONDANSETRON HCL 4 MG/2ML IJ SOLN: 4.0000 mg | Freq: Four times a day (QID) | INTRAMUSCULAR | Status: DC | PRN

## 2015-12-07 MED ORDER — SODIUM CHLORIDE 0.9 % WEIGHT BASED INFUSION: 3.0000 mL/kg/h | INTRAVENOUS | Status: DC

## 2015-12-07 MED ORDER — IOPAMIDOL (ISOVUE-300) INJECTION 61%
INTRAVENOUS | Status: DC | PRN
  Administered 2015-12-07: 155 mL via INTRAVENOUS

## 2015-12-07 MED ORDER — NITROGLYCERIN 5 MG/ML IV SOLN
INTRAVENOUS | Status: AC
  Filled 2015-12-07: qty 10

## 2015-12-07 MED ORDER — SODIUM CHLORIDE 0.9% FLUSH: 3.0000 mL | Freq: Two times a day (BID) | INTRAVENOUS | Status: DC

## 2015-12-07 MED ORDER — SODIUM CHLORIDE 0.9% FLUSH: 3.0000 mL | INTRAVENOUS | Status: DC | PRN

## 2015-12-07 MED ORDER — ASPIRIN 81 MG PO CHEW
CHEWABLE_TABLET | ORAL | Status: AC
  Administered 2015-12-07: 81 mg via ORAL
  Filled 2015-12-07: qty 1

## 2015-12-07 MED ORDER — ASPIRIN 81 MG PO CHEW: 81.0000 mg | CHEWABLE_TABLET | ORAL | Status: DC

## 2015-12-07 MED ORDER — SODIUM CHLORIDE 0.9 % WEIGHT BASED INFUSION
3.0000 mL/kg/h | INTRAVENOUS | Status: AC
  Administered 2015-12-07: 3 mL/kg/h via INTRAVENOUS

## 2015-12-07 MED ORDER — MIDAZOLAM HCL 2 MG/2ML IJ SOLN
INTRAMUSCULAR | Status: AC
  Filled 2015-12-07: qty 2

## 2015-12-07 MED ORDER — HEPARIN (PORCINE) IN NACL 2-0.9 UNIT/ML-% IJ SOLN
INTRAMUSCULAR | Status: AC
  Filled 2015-12-07: qty 500

## 2015-12-07 MED ORDER — ASPIRIN 81 MG PO CHEW
CHEWABLE_TABLET | ORAL | Status: AC
  Filled 2015-12-07: qty 3

## 2015-12-07 MED ORDER — ACETAMINOPHEN 325 MG PO TABS: 650.0000 mg | ORAL_TABLET | ORAL | Status: DC | PRN

## 2015-12-07 MED ORDER — ASPIRIN 81 MG PO CHEW
81.0000 mg | CHEWABLE_TABLET | ORAL | Status: AC
  Administered 2015-12-07: 81 mg via ORAL

## 2015-12-07 MED ORDER — MIDAZOLAM HCL 2 MG/2ML IJ SOLN
INTRAMUSCULAR | Status: DC | PRN
  Administered 2015-12-07: 1 mg via INTRAVENOUS

## 2015-12-07 MED ORDER — FENTANYL CITRATE (PF) 100 MCG/2ML IJ SOLN
INTRAMUSCULAR | Status: AC
  Filled 2015-12-07: qty 2

## 2015-12-07 MED ORDER — FENTANYL CITRATE (PF) 100 MCG/2ML IJ SOLN
INTRAMUSCULAR | Status: DC | PRN
  Administered 2015-12-07: 25 ug via INTRAVENOUS

## 2015-12-07 MED ORDER — BIVALIRUDIN 250 MG IV SOLR
INTRAVENOUS | Status: AC
  Filled 2015-12-07: qty 250

## 2015-12-07 MED ORDER — SODIUM CHLORIDE 0.9 % WEIGHT BASED INFUSION: 1.0000 mL/kg/h | INTRAVENOUS | Status: DC

## 2015-12-07 SURGICAL SUPPLY — 9 items
CATH INFINITI 5FR ANG PIGTAIL (CATHETERS) ×3 IMPLANT
CATH INFINITI 5FR JL4 (CATHETERS) ×3 IMPLANT
CATH INFINITI JR4 5F (CATHETERS) ×3 IMPLANT
DEVICE CLOSURE MYNXGRIP 5F (Vascular Products) ×3 IMPLANT
KIT MANI 3VAL PERCEP (MISCELLANEOUS) ×3 IMPLANT
NEEDLE PERC 18GX7CM (NEEDLE) ×3 IMPLANT
PACK CARDIAC CATH (CUSTOM PROCEDURE TRAY) ×3 IMPLANT
SHEATH PINNACLE 5F 10CM (SHEATH) ×3 IMPLANT
WIRE EMERALD 3MM-J .035X150CM (WIRE) ×3 IMPLANT

## 2015-12-16 ENCOUNTER — Telehealth (HOSPITAL_COMMUNITY): Payer: Self-pay

## 2015-12-16 NOTE — Telephone Encounter (Signed)
Called to schedule US Carotid with Dr. Estanislado Pandy. Pt stated that he did not want to schedule at this time. He has 3 heart surgeries coming up and would like to put this off for now. I told pt that I would try back later on this year to schedule. Pt agreed with this plan. AW

## 2016-01-03 DIAGNOSIS — I639 Cerebral infarction, unspecified: Secondary | ICD-10-CM | POA: Insufficient documentation

## 2016-01-03 DIAGNOSIS — Z951 Presence of aortocoronary bypass graft: Secondary | ICD-10-CM

## 2016-01-03 DIAGNOSIS — E039 Hypothyroidism, unspecified: Secondary | ICD-10-CM | POA: Insufficient documentation

## 2016-01-03 DIAGNOSIS — I4891 Unspecified atrial fibrillation: Secondary | ICD-10-CM

## 2016-01-03 DIAGNOSIS — E785 Hyperlipidemia, unspecified: Secondary | ICD-10-CM | POA: Insufficient documentation

## 2016-01-03 HISTORY — PX: CORONARY ARTERY BYPASS GRAFT: SHX141

## 2016-01-03 HISTORY — DX: Unspecified atrial fibrillation: I48.91

## 2016-01-03 HISTORY — DX: Presence of aortocoronary bypass graft: Z95.1

## 2016-01-17 DIAGNOSIS — Z951 Presence of aortocoronary bypass graft: Secondary | ICD-10-CM | POA: Insufficient documentation

## 2016-02-07 ENCOUNTER — Encounter: Payer: Medicare HMO | Attending: Cardiothoracic Surgery | Admitting: *Deleted

## 2016-02-07 VITALS — Ht 71.5 in | Wt 227.0 lb

## 2016-02-07 DIAGNOSIS — Z951 Presence of aortocoronary bypass graft: Secondary | ICD-10-CM | POA: Diagnosis present

## 2016-02-07 NOTE — Patient Instructions (Signed)
Patient Instructions  Patient Details  Name: Paul Bass MRN: [id number] Date of Birth: 1945/08/31 Referring Provider:  Gustavo Lah, MD  Below are the personal goals you chose as well as exercise and nutrition goals. Our goal is to help you keep on track towards obtaining and maintaining your goals. We will be discussing your progress on these goals with you throughout the program.  Initial Exercise Prescription:     Initial Exercise Prescription - 02/07/16 1400    Date of Initial Exercise RX and Referring Provider   Date 02/07/16   Referring Provider Dwaine Deter MD.   Treadmill   MPH 2   Grade 0   Minutes 15   METs 2.5   Bike   Level 2   Minutes 15   METs 3   NuStep   Level 3   Minutes 15   METs 3.5   REL-XR   Level 3   Minutes 15   METs 3   Prescription Details   Frequency (times per week) 3   Duration Progress to 30 minutes of continuous aerobic without signs/symptoms of physical distress   Intensity   THRR 40-80% of Max Heartrate 109-139   Ratings of Perceived Exertion 11-13   Perceived Dyspnea 0-4   Progression   Progression Continue progressive overload as per policy without signs/symptoms or physical distress.   Resistance Training   Training Prescription Yes   Weight 2   Reps 10-12      Exercise Goals: Frequency: Be able to perform aerobic exercise three times per week working toward 3-5 days per week.  Intensity: Work with a perceived exertion of 11 (fairly light) - 15 (hard) as tolerated. Follow your new exercise prescription and watch for changes in prescription as you progress with the program. Changes will be reviewed with you when they are made.  Duration: You should be able to do 30 minutes of continuous aerobic exercise in addition to a 5 minute warm-up and a 5 minute cool-down routine.  Nutrition Goals: Your personal nutrition goals will be established when you do your nutrition analysis with the dietician.  The following are  nutrition guidelines to follow: Cholesterol < 200mg /day Sodium < 1500mg /day Fiber: Men over 50 yrs - 30 grams per day  Personal Goals:     Personal Goals and Risk Factors at Admission - 02/07/16 1515    Core Components/Risk Factors/Patient Goals on Admission    Weight Management Weight Maintenance   Intervention Weight Management: Provide education and appropriate resources to help participant work on and attain dietary goals.   Admit Weight 227 lb (102.967 kg)   Goal Weight: Short Term 224 lb (101.606 kg)   Goal Weight: Long Term 224 lb (101.606 kg)   Expected Outcomes Short Term: Continue to assess and modify interventions until short term weight is achieved;Weight Maintenance: Understanding of the daily nutrition guidelines, which includes 25-35% calories from fat, 7% or less cal from saturated fats, less than 200mg  cholesterol, less than 1.5gm of sodium, & 5 or more servings of fruits and vegetables daily;Long Term: Adherence to nutrition and physical activity/exercise program aimed toward attainment of established weight goal   Sedentary Yes   Intervention Provide advice, education, support and counseling about physical activity/exercise needs.;Develop an individualized exercise prescription for aerobic and resistive training based on initial evaluation findings, risk stratification, comorbidities and participant's personal goals.   Expected Outcomes Achievement of increased cardiorespiratory fitness and enhanced flexibility, muscular endurance and strength shown through measurements of functional capacity  and personal statement of participant.   Increase Strength and Stamina Yes   Intervention Provide advice, education, support and counseling about physical activity/exercise needs.;Develop an individualized exercise prescription for aerobic and resistive training based on initial evaluation findings, risk stratification, comorbidities and participant's personal goals.   Expected Outcomes  Achievement of increased cardiorespiratory fitness and enhanced flexibility, muscular endurance and strength shown through measurements of functional capacity and personal statement of participant.   Diabetes Yes   Intervention Provide education about signs/symptoms and action to take for hypo/hyperglycemia.;Provide education about proper nutrition, including hydration, and aerobic/resistive exercise prescription along with prescribed medications to achieve blood glucose in normal ranges: Fasting glucose 65-99 mg/dL   Expected Outcomes Short Term: Participant verbalizes understanding of the signs/symptoms and immediate care of hyper/hypoglycemia, proper foot care and importance of medication, aerobic/resistive exercise and nutrition plan for blood glucose control.;Long Term: Attainment of HbA1C < 7%.   Hypertension Yes   Intervention Provide education on lifestyle modifcations including regular physical activity/exercise, weight management, moderate sodium restriction and increased consumption of fresh fruit, vegetables, and low fat dairy, alcohol moderation, and smoking cessation.;Monitor prescription use compliance.   Expected Outcomes Short Term: Continued assessment and intervention until BP is < 140/24mm HG in hypertensive participants. < 130/32mm HG in hypertensive participants with diabetes, heart failure or chronic kidney disease.;Long Term: Maintenance of blood pressure at goal levels.   Lipids Yes   Intervention Provide education and support for participant on nutrition & aerobic/resistive exercise along with prescribed medications to achieve LDL 70mg , HDL >40mg .   Expected Outcomes Short Term: Participant states understanding of desired cholesterol values and is compliant with medications prescribed. Participant is following exercise prescription and nutrition guidelines.;Long Term: Cholesterol controlled with medications as prescribed, with individualized exercise RX and with personalized  nutrition plan. Value goals: LDL < 70mg , HDL > 40 mg.   Stress Yes   Intervention Offer individual and/or small group education and counseling on adjustment to heart disease, stress management and health-related lifestyle change. Teach and support self-help strategies.;Refer participants experiencing significant psychosocial distress to appropriate mental health specialists for further evaluation and treatment. When possible, include family members and significant others in education/counseling sessions.   Expected Outcomes Short Term: Participant demonstrates changes in health-related behavior, relaxation and other stress management skills, ability to obtain effective social support, and compliance with psychotropic medications if prescribed.;Long Term: Emotional wellbeing is indicated by absence of clinically significant psychosocial distress or social isolation.      Tobacco Use Initial Evaluation: History  Smoking status  . Former Smoker -- 2.00 packs/day  . Types: Cigarettes  . Quit date: 05/20/2012  Smokeless tobacco  . Never Used    Copy of goals given to participant.

## 2016-02-07 NOTE — Progress Notes (Signed)
Cardiac Individual Treatment Plan  Patient Details  Name: Paul Bass MRN: [id number] Date of Birth: 07-02-1946 Referring Provider:        Cardiac Rehab from 02/07/2016 in [facility] Cardiac and Pulmonary Rehab   Referring Provider  Dwaine Deter MD.      Initial Encounter Date:       Cardiac Rehab from 02/07/2016 in [facility] Cardiac and Pulmonary Rehab   Date  02/07/16   Referring Provider  Dwaine Deter MD.      Visit Diagnosis: S/P CABG x 3  Patient's Home Medications on Admission:  Current outpatient prescriptions:  .  albuterol (PROVENTIL HFA;VENTOLIN HFA) 108 (90 BASE) MCG/ACT inhaler, Inhale 2 puffs into the lungs every 6 (six) hours as needed for wheezing or shortness of breath. For shortness of breath, Disp: , Rfl:  .  amiodarone (PACERONE) 400 MG tablet, Take 200 mg by mouth daily., Disp: , Rfl:  .  aspirin EC 81 MG tablet, Take 81 mg by mouth daily., Disp: , Rfl:  .  atorvastatin (LIPITOR) 80 MG tablet, Take 80 mg by mouth daily with supper., Disp: , Rfl:  .  budesonide-formoterol (SYMBICORT) 160-4.5 MCG/ACT inhaler, Inhale 2 puffs into the lungs 2 (two) times daily., Disp: , Rfl:  .  cholecalciferol (VITAMIN D) 400 UNITS TABS, Take 400 Units by mouth 2 (two) times daily., Disp: , Rfl:  .  citalopram (CELEXA) 20 MG tablet, Take 20 mg by mouth daily., Disp: , Rfl:  .  clonazePAM (KLONOPIN) 0.5 MG tablet, Take 0.5 mg by mouth 2 (two) times daily as needed for anxiety., Disp: , Rfl:  .  Coenzyme Q10 (CO Q-10) 200 MG CAPS, Take 1 capsule by mouth daily., Disp: , Rfl:  .  docusate sodium (COLACE) 100 MG capsule, Take 100 mg by mouth 2 (two) times daily., Disp: , Rfl:  .  glucose blood test strip, , Disp: , Rfl:  .  levothyroxine (SYNTHROID, LEVOTHROID) 100 MCG tablet, Take 100 mcg by mouth daily before breakfast., Disp: , Rfl:  .  metFORMIN (GLUCOPHAGE) 500 MG tablet, TAKE 1 TABLET BY MOUTH ONCE A DAY, Disp: , Rfl:  .  metoprolol tartrate (LOPRESSOR) 25 MG tablet, Take 12.5 mg  by mouth 2 (two) times daily., Disp: , Rfl:  .  Multiple Vitamin (MULTIVITAMIN WITH MINERALS) TABS, Take 0.5 tablets by mouth 2 (two) times daily., Disp: , Rfl:  .  Omega-3 Fatty Acids (FISH OIL) 1200 MG CAPS, Take 1,000 mg by mouth 2 (two) times daily., Disp: , Rfl:  .  omeprazole (PRILOSEC) 20 MG capsule, Take 20 mg by mouth daily., Disp: , Rfl:  .  tamsulosin (FLOMAX) 0.4 MG CAPS capsule, Take 0.4 mg by mouth daily. , Disp: , Rfl:  .  pravastatin (PRAVACHOL) 80 MG tablet, Take 40 mg by mouth at bedtime. Reported on 02/07/2016, Disp: , Rfl:   Past Medical History: Past Medical History  Diagnosis Date  . HTN (hypertension)   . CAD S/P percutaneous coronary angioplasty   . Hyperlipidemia   . COPD (chronic obstructive pulmonary disease) ([place])   . Tobacco abuse   . Hypothyroidism   . Depression   . Anxiety   . BPH (benign prostatic hypertrophy)   . Stroke [facility])     Tobacco Use: History  Smoking status  . Former Smoker -- 2.00 packs/day  . Types: Cigarettes  . Quit date: 05/20/2012  Smokeless tobacco  . Never Used    Labs: Recent Review [employer] for  [facility] Cardiac and Pulmonary Rehab Latest Ref Rng 05/12/2012 05/12/2012 05/13/2012   Cholestrol 0 - 200 mg/dL - - 119   LDLCALC 0 - 99 mg/dL - - 64   HDL >39 mg/dL - - 40   Trlycerides <150 mg/dL - - 76   Hemoglobin A1c <5.7 % - - 6.3(H)   PHART 7.350 - 7.450 7.243(L) 7.342(L) -   PCO2ART 35.0 - 45.0 mmHg 55.0(H) 48.4(H) -   HCO3 20.0 - 24.0 mEq/L 22.9 25.5(H) -   TCO2 0 - 100 mmol/L 24.6 27.0 -   ACIDBASEDEF 0.0 - 2.0 mmol/L 3.4(H) - -   O2SAT - 99.2 98.0 -       Exercise Target Goals: Date: 02/07/16  Exercise Program Goal: Individual exercise prescription set with THRR, safety & activity barriers. Participant demonstrates ability to understand and report RPE using BORG scale, to self-measure pulse accurately, and to acknowledge the importance of the exercise prescription.  Exercise Prescription  Goal: Starting with aerobic activity 30 plus minutes a day, 3 days per week for initial exercise prescription. Provide home exercise prescription and guidelines that participant acknowledges understanding prior to discharge.  Activity Barriers & Risk Stratification:     Activity Barriers & Cardiac Risk Stratification - 02/07/16 1502    Activity Barriers & Cardiac Risk Stratification   Activity Barriers Back Problems;Muscular Weakness;Shortness of Breath   Cardiac Risk Stratification High      6 Minute Walk:     6 Minute Walk      02/07/16 1429       6 Minute Walk   Phase Initial     Distance 1086 feet     Walk Time 6 minutes     # of Rest Breaks 0     MPH 2     METS 2.58     RPE 13     Symptoms No     Resting HR 58 bpm     Resting BP 114/66 mmHg     Max Ex. HR 70 bpm     Max Ex. BP 140/70 mmHg        Initial Exercise Prescription:     Initial Exercise Prescription - 02/07/16 1400    Date of Initial Exercise RX and Referring Provider   Date 02/07/16   Referring Provider Dwaine Deter MD.   Treadmill   MPH 2   Grade 0   Minutes 15   METs 2.5   Bike   Level 2   Minutes 15   METs 3   NuStep   Level 3   Minutes 15   METs 3.5   REL-XR   Level 3   Minutes 15   METs 3   Prescription Details   Frequency (times per week) 3   Duration Progress to 30 minutes of continuous aerobic without signs/symptoms of physical distress   Intensity   THRR 40-80% of Max Heartrate 109-139   Ratings of Perceived Exertion 11-13   Perceived Dyspnea 0-4   Progression   Progression Continue progressive overload as per policy without signs/symptoms or physical distress.   Resistance Training   Training Prescription Yes   Weight 2   Reps 10-12      Perform Capillary Blood Glucose checks as needed.  Exercise Prescription Changes:   Exercise Comments:   Discharge Exercise Prescription (Final Exercise Prescription Changes):   Nutrition:  Target Goals:  Understanding of nutrition guidelines, daily intake of sodium 1500mg , cholesterol 200mg , calories 30% from fat and 7% or less  from saturated fats, daily to have 5 or more servings of fruits and vegetables.  Biometrics:     Pre Biometrics - 02/07/16 1439    Pre Biometrics   Height 5' 11.5" (1.816 m)   Weight 227 lb (102.967 kg)   Waist Circumference 44.5 inches   Hip Circumference 41.5 inches   Waist to Hip Ratio 1.07 %   BMI (Calculated) 31.3   Single Leg Stand 5 seconds       Nutrition Therapy Plan and Nutrition Goals:   Nutrition Discharge: Rate Your Plate Scores:   Nutrition Goals Re-Evaluation:   Psychosocial: Target Goals: Acknowledge presence or absence of depression, maximize coping skills, provide positive support system. Participant is able to verbalize types and ability to use techniques and skills needed for reducing stress and depression.  Initial Review & Psychosocial Screening:     Initial Psych Review & Screening - 02/07/16 1518    Initial Review   Current issues with Current Depression;History of Depression;Current Psychotropic Meds;Current Sleep Concerns;Current Stress Concerns   Source of Stress Concerns Unable to perform yard/household activities;Chronic Illness   Comments W[place]law had a stroke in 2013 which affected his speech; ability to write; and comprehension.  States he can read, but may take him longer to understand what he is reading.     Family Dynamics   Good Support System? Yes  Jeraldo states his wife Vaughan Basta is very supportive.  He has one child and one grandchild, which he states they are not close.  No church affiliation.     Barriers   Psychosocial barriers to participate in program Psychosocial barriers identified (see note)  See above comments.  Kosei is currently on Celexa 20mg  once a day.  Elbin has a history of depression and has been to counseling in the past.  He states he does not feel he needs counseling now.     Screening  Interventions   Interventions Encouraged to exercise;Program counselor consult      Quality of Life Scores:     Quality of Life - 02/07/16 1524    Quality of Life Scores   Health/Function Pre 19.03 %   Socioeconomic Pre 23.29 %   Psych/Spiritual Pre 21.14 %   Family Pre 28.8 %   GLOBAL Pre 21.78 %      PHQ-9:     Recent Review Flowsheet Data    Depression screen [facility] 2/9 02/07/2016   Decreased Interest 2   Down, Depressed, Hopeless 1   PHQ - 2 Score 3   Altered sleeping 1   Tired, decreased energy 2   Change in appetite 0   Feeling bad or failure about yourself  2   Trouble concentrating 1   Moving slowly or fidgety/restless 2   Suicidal thoughts 0   PHQ-9 Score 11   Difficult doing work/chores Somewhat difficult      Psychosocial Evaluation and Intervention:   Psychosocial Re-Evaluation:   Vocational Rehabilitation: Provide vocational rehab assistance to qualifying candidates.   Vocational Rehab Evaluation & Intervention:     Vocational Rehab - 02/07/16 1513    Initial Vocational Rehab Evaluation & Intervention   Assessment shows need for Vocational Rehabilitation No  Patient is retired      Education: Education Goals: Education classes will be provided on a weekly basis, covering required topics. Participant will state understanding/return demonstration of topics presented.  Learning Barriers/Preferences:     Learning Barriers/Preferences - 02/07/16 1505    Learning Barriers/Preferences   Learning Barriers --  Had stroke in 2013 which affected his speech and writing.  Also, patient states he can read but it takes him longer to understand what he is reading.   Learning Preferences Individual Instruction      Education Topics: General Nutrition Guidelines/Fats and Fiber: -Group instruction provided by verbal, written material, models and posters to present the general guidelines for heart healthy nutrition. Gives an explanation and review of  dietary fats and fiber.   Controlling Sodium/Reading Food Labels: -Group verbal and written material supporting the discussion of sodium use in heart healthy nutrition. Review and explanation with models, verbal and written materials for utilization of the food label.   Exercise Physiology & Risk Factors: - Group verbal and written instruction with models to review the exercise physiology of the cardiovascular system and associated critical values. Details cardiovascular disease risk factors and the goals associated with each risk factor.   Aerobic Exercise & Resistance Training: - Gives group verbal and written discussion on the health impact of inactivity. On the components of aerobic and resistive training programs and the benefits of this training and how to safely progress through these programs.   Flexibility, Balance, General Exercise Guidelines: - Provides group verbal and written instruction on the benefits of flexibility and balance training programs. Provides general exercise guidelines with specific guidelines to those with heart or lung disease. Demonstration and skill practice provided.   Stress Management: - Provides group verbal and written instruction about the health risks of elevated stress, cause of high stress, and healthy ways to reduce stress.   Depression: - Provides group verbal and written instruction on the correlation between heart/lung disease and depressed mood, treatment options, and the stigmas associated with seeking treatment.   Anatomy & Physiology of the Heart: - Group verbal and written instruction and models provide basic cardiac anatomy and physiology, with the coronary electrical and arterial systems. Review of: AMI, Angina, Valve disease, Heart Failure, Cardiac Arrhythmia, Pacemakers, and the ICD.   Cardiac Procedures: - Group verbal and written instruction and models to describe the testing methods done to diagnose heart disease. Reviews the  outcomes of the test results. Describes the treatment choices: Medical Management, Angioplasty, or Coronary Bypass Surgery.   Cardiac Medications: - Group verbal and written instruction to review commonly prescribed medications for heart disease. Reviews the medication, class of the drug, and side effects. Includes the steps to properly store meds and maintain the prescription regimen.   Go Sex-Intimacy & Heart Disease, Get SMART - Goal Setting: - Group verbal and written instruction through game format to discuss heart disease and the return to sexual intimacy. Provides group verbal and written material to discuss and apply goal setting through the application of the S.M.A.R.T. Method.   Other Matters of the Heart: - Provides group verbal, written materials and models to describe Heart Failure, Angina, Valve Disease, and Diabetes in the realm of heart disease. Includes description of the disease process and treatment options available to the cardiac patient.   Exercise & Equipment Safety: - Individual verbal instruction and demonstration of equipment use and safety with use of the equipment.          Cardiac Rehab from 02/07/2016 in [facility] Cardiac and Pulmonary Rehab   Date  02/07/16   Educator  D. Joya Gaskins, RN   Instruction Review Code  1- partially meets, needs review/practice      Infection Prevention: - Provides verbal and written material to individual with discussion of infection control including proper hand washing  and proper equipment cleaning during exercise session.      Cardiac Rehab from 02/07/2016 in [facility] Cardiac and Pulmonary Rehab   Date  02/07/16   Educator  D. Joya Gaskins, RN   Instruction Review Code  2- meets goals/outcomes      Falls Prevention: - Provides verbal and written material to individual with discussion of falls prevention and safety.      Cardiac Rehab from 02/07/2016 in [facility] Cardiac and Pulmonary Rehab   Date  02/07/16   Educator  D. Joya Gaskins, RN    Instruction Review Code  1- partially meets, needs review/practice      Diabetes: - Individual verbal and written instruction to review signs/symptoms of diabetes, desired ranges of glucose level fasting, after meals and with exercise. Advice that pre and post exercise glucose checks will be done for 3 sessions at entry of program.      Cardiac Rehab from 02/07/2016 in [place] Cardiac and Pulmonary Rehab   Date  02/07/16   Educator  D.Joya Gaskins, RN   Instruction Review Code  2- meets goals/outcomes       Knowledge Questionnaire Score:     Knowledge Questionnaire Score - 02/07/16 1510    Knowledge Questionnaire Score   Pre Score 22/28      Core Components/Risk Factors/Patient Goals at Admission:     Personal Goals and Risk Factors at Admission - 02/07/16 1515    Core Components/Risk Factors/Patient Goals on Admission    Weight Management Weight Maintenance   Intervention Weight Management: Provide education and appropriate resources to help participant work on and attain dietary goals.   Admit Weight 227 lb (102.967 kg)   Goal Weight: Short Term 224 lb (101.606 kg)   Goal Weight: Long Term 224 lb (101.606 kg)   Expected Outcomes Short Term: Continue to assess and modify interventions until short term weight is achieved;Weight Maintenance: Understanding of the daily nutrition guidelines, which includes 25-35% calories from fat, 7% or less cal from saturated fats, less than 200mg  cholesterol, less than 1.5gm of sodium, & 5 or more servings of fruits and vegetables daily;Long Term: Adherence to nutrition and physical activity/exercise program aimed toward attainment of established weight goal   Sedentary Yes   Intervention Provide advice, education, support and counseling about physical activity/exercise needs.;Develop an individualized exercise prescription for aerobic and resistive training based on initial evaluation findings, risk stratification, comorbidities and participant's personal  goals.   Expected Outcomes Achievement of increased cardiorespiratory fitness and enhanced flexibility, muscular endurance and strength shown through measurements of functional capacity and personal statement of participant.   Increase Strength and Stamina Yes   Intervention Provide advice, education, support and counseling about physical activity/exercise needs.;Develop an individualized exercise prescription for aerobic and resistive training based on initial evaluation findings, risk stratification, comorbidities and participant's personal goals.   Expected Outcomes Achievement of increased cardiorespiratory fitness and enhanced flexibility, muscular endurance and strength shown through measurements of functional capacity and personal statement of participant.   Diabetes Yes   Intervention Provide education about signs/symptoms and action to take for hypo/hyperglycemia.;Provide education about proper nutrition, including hydration, and aerobic/resistive exercise prescription along with prescribed medications to achieve blood glucose in normal ranges: Fasting glucose 65-99 mg/dL   Expected Outcomes Short Term: Participant verbalizes understanding of the signs/symptoms and immediate care of hyper/hypoglycemia, proper foot care and importance of medication, aerobic/resistive exercise and nutrition plan for blood glucose control.;Long Term: Attainment of HbA1C < 7%.   Hypertension Yes   Intervention Provide education on  lifestyle modifcations including regular physical activity/exercise, weight management, moderate sodium restriction and increased consumption of fresh fruit, vegetables, and low fat dairy, alcohol moderation, and smoking cessation.;Monitor prescription use compliance.   Expected Outcomes Short Term: Continued assessment and intervention until BP is < 140/62mm HG in hypertensive participants. < 130/16mm HG in hypertensive participants with diabetes, heart failure or chronic kidney  disease.;Long Term: Maintenance of blood pressure at goal levels.   Lipids Yes   Intervention Provide education and support for participant on nutrition & aerobic/resistive exercise along with prescribed medications to achieve LDL 70mg , HDL >40mg .   Expected Outcomes Short Term: Participant states understanding of desired cholesterol values and is compliant with medications prescribed. Participant is following exercise prescription and nutrition guidelines.;Long Term: Cholesterol controlled with medications as prescribed, with individualized exercise RX and with personalized nutrition plan. Value goals: LDL < 70mg , HDL > 40 mg.   Stress Yes   Intervention Offer individual and/or small group education and counseling on adjustment to heart disease, stress management and health-related lifestyle change. Teach and support self-help strategies.;Refer participants experiencing significant psychosocial distress to appropriate mental health specialists for further evaluation and treatment. When possible, include family members and significant others in education/counseling sessions.   Expected Outcomes Short Term: Participant demonstrates changes in health-related behavior, relaxation and other stress management skills, ability to obtain effective social support, and compliance with psychotropic medications if prescribed.;Long Term: Emotional wellbeing is indicated by absence of clinically significant psychosocial distress or social isolation.      Core Components/Risk Factors/Patient Goals Review:    Core Components/Risk Factors/Patient Goals at Discharge (Final Review):    ITP Comments:   Comments: Izyan Magstadt presents for cardiac rehab orientation today.  He is  s/p CABG x 3.  Former smoker, who quit in 2013 when he had a stroke.  The stroke affected his speech, writing, and his ability to understand what he is reading.  He is able to read, but takes him a little longer to understand what he is  reading as compare to before the stroke.  His PHQ-9 score was 11.  He is not currently seeing a counselor, although he has seen one in the past.  He denies suicidal or homicidal ideation.  He is currently on Celexa 20 mg once a day.  Our program counselor will be seeing him for further evaluation.  Volvy states he gets dizzy at times if he stands up too quickly.  He is s/p fall in May of this year.  He is a high falls risk.   Abb Twigg will be starting cardiac rehab on Friday, February 11, 2016 at 0730.

## 2016-02-11 ENCOUNTER — Encounter: Payer: Medicare HMO | Admitting: *Deleted

## 2016-02-11 DIAGNOSIS — Z951 Presence of aortocoronary bypass graft: Secondary | ICD-10-CM | POA: Diagnosis not present

## 2016-02-11 NOTE — Progress Notes (Signed)
Daily Session Note  Patient Details  Name: Paul Bass MRN: [id number] Date of Birth: [birth year] Referring Provider:        Cardiac Rehab from 02/07/2016 in [facility] Cardiac and Pulmonary Rehab   Referring Provider  Dwaine Deter MD.      Encounter Date: 02/11/2016  Check In:     Session Check In - 02/11/16 0941    Check-In   Location [facility]-Cardiac & Pulmonary Rehab   Staff Present Nyoka Cowden, RN, BSN, MA;Lavanya Roa, RN, Levie Heritage, MA, ACSM RCEP, Exercise Physiologist   Supervising physician immediately available to respond to emergencies See telemetry face sheet for immediately available ER MD   Medication changes reported     No   Fall or balance concerns reported    No   Warm-up and Cool-down Performed on first and last piece of equipment   Resistance Training Performed Yes   VAD Patient? No   Pain Assessment   Currently in Pain? No/denies         Goals Met:  Proper associated with RPD/PD & O2 Sat Exercise tolerated well  Goals Unmet:  Not Applicable  Comments:     Dr. Emily Filbert is Medical Director for [place] and [facility].

## 2016-02-14 ENCOUNTER — Encounter: Payer: Medicare HMO | Admitting: *Deleted

## 2016-02-14 DIAGNOSIS — Z951 Presence of aortocoronary bypass graft: Secondary | ICD-10-CM

## 2016-02-14 NOTE — Progress Notes (Signed)
Daily Session Note  Patient Details  Name: Paul Bass MRN: [id number] Date of Birth: Apr 08, 1946 Referring Provider:        Cardiac Rehab from 02/07/2016 in [facility] Cardiac and Pulmonary Rehab   Referring Provider  Dwaine Deter MD.      Encounter Date: 02/14/2016  Check In:     Session Check In - 02/14/16 1002    Check-In   Location [facility]-Cardiac & Pulmonary Rehab   Staff Present Heath Lark, RN, BSN, [employer], BS, ACSM CEP, Exercise Physiologist;Jessica [place], [place], ACSM RCEP, Exercise Physiologist   Supervising physician immediately available to respond to emergencies See telemetry face sheet for immediately available ER MD   Medication changes reported     No   Fall or balance concerns reported    No   Warm-up and Cool-down Performed on first and last piece of equipment   Resistance Training Performed Yes   VAD Patient? No   Pain Assessment   Currently in Pain? No/denies   Multiple Pain Sites No         Goals Met:  Independence with exercise equipment Exercise tolerated well No report of cardiac concerns or symptoms Strength training completed today  Goals Unmet:  Not Applicable  Comments: Patient completed exercise prescription and all exercise goals during rehab session. The exercise was tolerated well and the patient is progressing in the program.     Dr. Emily Filbert is Medical Director for [place] and [facility].

## 2016-02-15 ENCOUNTER — Telehealth: Payer: Self-pay | Admitting: Neurology

## 2016-02-15 ENCOUNTER — Encounter: Payer: Self-pay | Admitting: Neurology

## 2016-02-15 ENCOUNTER — Ambulatory Visit (INDEPENDENT_AMBULATORY_CARE_PROVIDER_SITE_OTHER): Payer: Medicare HMO | Admitting: Neurology

## 2016-02-15 VITALS — BP 105/62 | HR 54 | Ht 72.0 in | Wt 234.0 lb

## 2016-02-15 DIAGNOSIS — I6521 Occlusion and stenosis of right carotid artery: Secondary | ICD-10-CM | POA: Diagnosis not present

## 2016-02-15 NOTE — Patient Instructions (Signed)
I had a long d/w patient about his remote stroke, carotid stenosis,risk for recurrent stroke/TIAs, personally independently reviewed imaging studies and stroke evaluation results and answered questions.Continue clopidogrel 75 mg daily  for secondary stroke prevention and  Discuss with his cardiologist whether he can discontinue aspirin and his and do not see a long-term indication for dual antiplatelet therapy .Maintain strict control of hypertension with blood pressure goal below 130/90, diabetes with hemoglobin A1c goal below 6.5% and lipids with LDL cholesterol goal below 70 mg/dL. I also advised the patient to eat a healthy diet with plenty of whole grains, cereals, fruits and vegetables, exercise regularly and maintain ideal body weight .Check follow-up carotid ultrasound study. No routine followup in the future with me is necessary and he can be referred back as needed in the future

## 2016-02-15 NOTE — Progress Notes (Signed)
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 Vascular risk factors of hypertension, smoking, coronary artery disease and carotid disease.   He returns for followup of the last visit on 10/24/13 with me.  He continues to have occasional speech, word finding difficulties as well her right-sided weakness particularly when tired. He feels that his decreased energy and overall strength particularly in the right grip when he can dropped objects at times. He had started going to the gym but had to discontinue because of arthralgias and muscle aches. On Lipitor and he has reduced his dose from 80 to 40 mg daily. He plans to see his family physician next month to have followup lipid profile checked. He had repeat carotid ultrasound done in my office on 09/05/12 which showed 50-69% Rt ICA stenosis which was stable and subsequently underwent a diagnostic cerebral angiogram with Dr.Deveshwar On 09/20/12 which showed 30% intrastent stenosis on left and 50-60 % RICA stenosis. His last carotid ultrasound was uncharged, done in December 2014. He also decided not to discontinue aspirin but has reduced the dose to 81 mg daily. He does complain of bruising in his skin. He has no new neurological symptoms.  Update 02/16/2015 : He returns for follow-up after last visit to 9 months ago. Continues to do well without recurrent stroke or TIA symptoms. He remains on aspirin which is tolerating well with only minor bruising and no bleeding episodes. His blood pressure is well controlled and it is  134/64 in office today. He states his fasting sugars range from 110 to 130s and last hemoglobin A1c was 6.2. His last lipid profile was also fine. He had follow-up carotid ultrasound done on 07/01/14 which I personally reviewed and shows bilateral 50-69% left ICA stenosis. He continues to have mild word finding difficulties and occasional aphasic errors and mild balance difficulties but these are unchanged. He is also had some mild short-term memory difficulties which have progressed since his stroke. Update 02/15/2016 : He returns for follow-up after last visit a year ago. Patient continues to do well from neurovascular standpoint and has not had any recurrent stroke or TIA symptoms now for nearly 4 years. He did undergo coronary artery bypass graft surgery at [facility] in June which went well. He remains on aspirin and Plavix and is tolerating it well with minor bruising. He still has mild expressive language difficulties as well as diminished fine motor skills in his right hand with poor writing. He has not had follow-up carotid Doppler studies done for nearly a year. He states his sugar is well controlled. He is tolerating Lipitor well without myalgias or arthralgias and cannot tell me the last time his lipid profile was checked. ROS:  14 system review of systems is positive for  , fatigue, blurred vision, shortness of breath, restless legs, frequency of urination, muscle cramps, neck pain, speech difficulty and all other systems negative   ALLERGIES: No Known Allergies  HOME MEDICATIONS: Outpatient Prescriptions Prior to Visit  Medication Sig Dispense Refill  . albuterol (PROVENTIL HFA;VENTOLIN HFA) 108 (90 BASE) MCG/ACT inhaler Inhale 2  puffs into the lungs every 6 (six) hours as needed for wheezing or shortness of breath. For shortness of breath    . aspirin EC 81 MG tablet Take 81 mg by mouth daily.    . budesonide-formoterol (SYMBICORT) 160-4.5 MCG/ACT inhaler Inhale 2 puffs into the lungs  2 (two) times daily.    . cholecalciferol (VITAMIN D) 400 UNITS TABS Take 400 Units by mouth 2 (two) times daily.    . citalopram (CELEXA) 20 MG tablet Take 20 mg by mouth daily.    . clonazePAM (KLONOPIN) 0.5 MG tablet Take 0.5 mg by mouth 2 (two) times daily as needed for anxiety.    . Coenzyme Q10 (CO Q-10) 200 MG CAPS Take 1 capsule by mouth daily.    Marland Kitchen glucose blood test strip     . levothyroxine (SYNTHROID, LEVOTHROID) 100 MCG tablet Take 100 mcg by mouth daily before breakfast.    . metFORMIN (GLUCOPHAGE) 500 MG tablet TAKE 1 TABLET BY MOUTH ONCE A DAY    . Multiple Vitamin (MULTIVITAMIN WITH MINERALS) TABS Take 0.5 tablets by mouth 2 (two) times daily.    . Omega-3 Fatty Acids (FISH OIL) 1200 MG CAPS Take 1,000 mg by mouth 2 (two) times daily.    Marland Kitchen omeprazole (PRILOSEC) 20 MG capsule Take 20 mg by mouth daily.    . tamsulosin (FLOMAX) 0.4 MG CAPS capsule Take 0.4 mg by mouth daily.     Marland Kitchen amiodarone (PACERONE) 400 MG tablet Take 200 mg by mouth daily. Reported on 02/15/2016    . pravastatin (PRAVACHOL) 80 MG tablet Take 40 mg by mouth at bedtime. Reported on 02/15/2016     No facility-administered medications prior to visit.    PHYSICAL EXAM Filed Vitals:   02/15/16 1524  BP: 105/62  Pulse: 54  Height: 6' (1.829 m)  Weight: 234 lb (106.142 kg)   Body mass index is 31.73 kg/(m^2).  Physical Exam  General: well developed, well nourished mildly obese middle aged Caucasian male, seated, in no evident distress  Head: head normocephalic and atraumatic.    Neck: supple with no carotid or supraclavicular bruits  Cardiovascular: regular rate and rhythm, no murmurs  Respiratory : scattered rhonchi bilaterally Musculoskeletal: no deformity  Skin: no rash but scattered petichiae  Vascular: Normal pulses all extremities   Neurologic Exam  Mental Status: Awake and fully alert. Oriented to place and time. Recent and remote memory intact. Slightly diminished recall 2/3. Attention span,  concentration and fund of knowledge appropriate. Mood and affect appropriate. Very mild dysfluency and paraphasic errors.   Cranial Nerves: Fundoscopic exam not done.  Pupils equal, briskly reactive to light. Extraocular movements full without nystagmus. Visual fields full to confrontation. Hearing intact. Facial sensation intact. Face, tongue, palate moves normally and symmetrically.  Motor: Normal bulk and tone. Normal strength in all tested extremity muscles. Diminished fine finger movements on the right. Orbits left over right upper extremity. Mild weakness of right grip.  Sensory: intact to touch and pinprick and vibratory sensation.  Coordination: Rapid alternating movements normal in all extremities. Finger-to-nose and heel-to-shin performed accurately bilaterally.  Gait and Station: Arises from chair without difficulty. Stance is normal. Gait demonstrates normal stride length and balance . Unable to heel, toe and tandem walk without difficulty.  Reflexes: 1+ and symmetric.  ASSESSMENT: 22 year Caucasian male with a left MCA infarct in October 2013 due to left M1 occlusion from possible moderate left ICA stenosis treated with IV t-PA as well as mechanical embolectomy with  Trevo Provue retrieval device with full recanalization and proximal left ICA rescue stent. Minimum residual expressive aphasia and very mild right hemiparesis persists. Vascular risk factors of hypertension, smoking, coronary artery disease and carotid disease.   PLAN:  I had a long d/w patient about his wife about his remote stroke, carotid stenosis,risk for recurrent stroke/TIAs, personally independently reviewed imaging studies and stroke evaluation results and answered questions.Continue clopidogrel 75 mg daily  for secondary stroke prevention and  Discuss with his cardiologist whether he can discontinue aspirin and his and do not see a long-term indication for dual antiplatelet therapy .Maintain strict control of  hypertension with blood pressure goal below 130/90, diabetes with hemoglobin A1c goal below 6.5% and lipids with LDL cholesterol goal below 70 mg/dL. I also advised the patient to eat a healthy diet with plenty of whole grains, cereals, fruits and vegetables, exercise regularly and maintain ideal body weight .Check follow-up carotid ultrasound study. Greater than 50% time during this 25 minute visit was spent on counseling and coordination of care about stroke risk, prevention and treatment No routine followup in the future with me is necessary and he can be referred back as needed in the future  Antony Contras, MD  02/15/2016, 5:25 PM [facility] [address], [place], [place] [address] [phone]  Note: This document was prepared with digital dictation and possible smart phrase technology. Any transcriptional errors that result from this process are unintentional.

## 2016-02-15 NOTE — Telephone Encounter (Signed)
Hi Shannon this Patient can be scheduled for doppler NO PA needed . Thanks Hinton Dyer

## 2016-02-16 ENCOUNTER — Encounter: Payer: Self-pay | Admitting: *Deleted

## 2016-02-16 DIAGNOSIS — Z951 Presence of aortocoronary bypass graft: Secondary | ICD-10-CM | POA: Diagnosis not present

## 2016-02-16 LAB — GLUCOSE, CAPILLARY: Glucose-Capillary: 143 mg/dL — ABNORMAL HIGH (ref 65–99)

## 2016-02-16 NOTE — Progress Notes (Signed)
Daily Session Note  Patient Details  Name: Paul Bass MRN: [id number] Date of Birth: Aug 13, 1945 Referring Provider:        Cardiac Rehab from 02/07/2016 in [facility]   Referring Provider  Dwaine Deter MD.      Encounter Date: 02/16/2016  Check In:     Session Check In - 02/16/16 0845    Check-In   Location [facility]   Staff Present Alberteen Sam, MA, ACSM RCEP, Exercise Physiologist;Amanda Oletta Darter, BA, ACSM CEP, Exercise Physiologist;Carroll Enterkin, RN, BSN   Supervising physician immediately available to respond to emergencies See telemetry face sheet for immediately available ER MD   Medication changes reported     No   Fall or balance concerns reported    No   Warm-up and Cool-down Performed on first and last piece of equipment   Resistance Training Performed Yes   VAD Patient? No   Pain Assessment   Currently in Pain? No/denies   Multiple Pain Sites No         Goals Met:  Independence with exercise equipment Exercise tolerated well No report of cardiac concerns or symptoms Strength training completed today  Goals Unmet:  Not Applicable  Comments: Pt able to follow exercise prescription today without complaint.  Will continue to monitor for progression.    Dr. Emily Filbert is Medical Director for [facility].

## 2016-02-16 NOTE — Progress Notes (Signed)
Cardiac Individual Treatment Plan  Patient Details  Name: Paul Bass MRN: [id number] Date of Birth: [birth year] Referring Provider:        Cardiac Rehab from 02/07/2016 in [facility] Cardiac and Pulmonary Rehab   Referring Provider  Dwaine Deter MD.      Initial Encounter Date:       Cardiac Rehab from 02/07/2016 in [facility] Cardiac and Pulmonary Rehab   Date  02/07/16   Referring Provider  Dwaine Deter MD.      Visit Diagnosis: S/P CABG x 3  Patient's Home Medications on Admission:  Current outpatient prescriptions:  .  albuterol (PROVENTIL HFA;VENTOLIN HFA) 108 (90 BASE) MCG/ACT inhaler, Inhale 2 puffs into the lungs every 6 (six) hours as needed for wheezing or shortness of breath. For shortness of breath, Disp: , Rfl:  .  amiodarone (PACERONE) 200 MG tablet, TAKE 2 TABLETS (400 MG TOTAL) BY MOUTH DAILY., Disp: , Rfl: 11 .  aspirin EC 81 MG tablet, Take 81 mg by mouth daily., Disp: , Rfl:  .  atorvastatin (LIPITOR) 80 MG tablet, Take 80 mg by mouth., Disp: , Rfl:  .  budesonide-formoterol (SYMBICORT) 160-4.5 MCG/ACT inhaler, Inhale 2 puffs into the lungs 2 (two) times daily., Disp: , Rfl:  .  cholecalciferol (VITAMIN D) 400 UNITS TABS, Take 400 Units by mouth 2 (two) times daily., Disp: , Rfl:  .  citalopram (CELEXA) 20 MG tablet, Take 20 mg by mouth daily., Disp: , Rfl:  .  clonazePAM (KLONOPIN) 0.5 MG tablet, Take 0.5 mg by mouth 2 (two) times daily as needed for anxiety., Disp: , Rfl:  .  clopidogrel (PLAVIX) 75 MG tablet, TAKE 1 TABLET (75 MG TOTAL) BY MOUTH ONCE DAILY., Disp: , Rfl: 1 .  Coenzyme Q10 (CO Q-10) 200 MG CAPS, Take 1 capsule by mouth daily., Disp: , Rfl:  .  glucose blood test strip, , Disp: , Rfl:  .  levothyroxine (SYNTHROID, LEVOTHROID) 100 MCG tablet, Take 100 mcg by mouth daily before breakfast., Disp: , Rfl:  .  losartan (COZAAR) 25 MG tablet, Take by mouth., Disp: , Rfl:  .  metFORMIN (GLUCOPHAGE) 500 MG tablet, TAKE 1 TABLET BY MOUTH ONCE A DAY, Disp:  , Rfl:  .  Multiple Vitamin (MULTIVITAMIN WITH MINERALS) TABS, Take 0.5 tablets by mouth 2 (two) times daily., Disp: , Rfl:  .  Omega-3 Fatty Acids (FISH OIL) 1200 MG CAPS, Take 1,000 mg by mouth 2 (two) times daily., Disp: , Rfl:  .  omeprazole (PRILOSEC) 20 MG capsule, Take 20 mg by mouth daily., Disp: , Rfl:  .  PSYLLIUM PO, Take by mouth., Disp: , Rfl:  .  tamsulosin (FLOMAX) 0.4 MG CAPS capsule, Take 0.4 mg by mouth daily. , Disp: , Rfl:   Past Medical History: Past Medical History  Diagnosis Date  . HTN (hypertension)   . CAD S/P percutaneous coronary angioplasty   . Hyperlipidemia   . COPD (chronic obstructive pulmonary disease) ([place])   . Tobacco abuse   . Hypothyroidism   . Depression   . Anxiety   . BPH (benign prostatic hypertrophy)   . Stroke [facility])     Tobacco Use: History  Smoking status  . Former Smoker -- 2.00 packs/day  . Types: Cigarettes  . Quit date: 05/20/2012  Smokeless tobacco  . Never Used    Labs: Recent Review Flowsheet Data    Labs for ITP Cardiac and Pulmonary Rehab Latest Ref Rng 05/12/2012 05/12/2012 05/13/2012   Cholestrol 0 - 200  mg/dL - - 119   LDLCALC 0 - 99 mg/dL - - 64   HDL >39 mg/dL - - 40   Trlycerides <150 mg/dL - - 76   Hemoglobin A1c <5.7 % - - 6.3(H)   PHART 7.350 - 7.450 7.243(L) 7.342(L) -   PCO2ART 35.0 - 45.0 mmHg 55.0(H) 48.4(H) -   HCO3 20.0 - 24.0 mEq/L 22.9 25.5(H) -   TCO2 0 - 100 mmol/L 24.6 27.0 -   ACIDBASEDEF 0.0 - 2.0 mmol/L 3.4(H) - -   O2SAT - 99.2 98.0 -       Exercise Target Goals:    Exercise Program Goal: Individual exercise prescription set with THRR, safety & activity barriers. Participant demonstrates ability to understand and report RPE using BORG scale, to self-measure pulse accurately, and to acknowledge the importance of the exercise prescription.  Exercise Prescription Goal: Starting with aerobic activity 30 plus minutes a day, 3 days per week for initial exercise prescription. Provide home  exercise prescription and guidelines that participant acknowledges understanding prior to discharge.  Activity Barriers & Risk Stratification:     Activity Barriers & Cardiac Risk Stratification - 02/07/16 1502    Activity Barriers & Cardiac Risk Stratification   Activity Barriers Back Problems;Muscular Weakness;Shortness of Breath   Cardiac Risk Stratification High      6 Minute Walk:     6 Minute Walk      02/07/16 1429       6 Minute Walk   Phase Initial     Distance 1086 feet     Walk Time 6 minutes     # of Rest Breaks 0     MPH 2     METS 2.58     RPE 13     Symptoms No     Resting HR 58 bpm     Resting BP 114/66 mmHg     Max Ex. HR 70 bpm     Max Ex. BP 140/70 mmHg        Initial Exercise Prescription:     Initial Exercise Prescription - 02/07/16 1400    Date of Initial Exercise RX and Referring Provider   Date 02/07/16   Referring Provider Dwaine Deter MD.   Treadmill   MPH 2   Grade 0   Minutes 15   METs 2.5   Bike   Level 2   Minutes 15   METs 3   NuStep   Level 3   Minutes 15   METs 3.5   REL-XR   Level 3   Minutes 15   METs 3   Prescription Details   Frequency (times per week) 3   Duration Progress to 30 minutes of continuous aerobic without signs/symptoms of physical distress   Intensity   THRR 40-80% of Max Heartrate 109-139   Ratings of Perceived Exertion 11-13   Perceived Dyspnea 0-4   Progression   Progression Continue progressive overload as per policy without signs/symptoms or physical distress.   Resistance Training   Training Prescription Yes   Weight 2   Reps 10-12      Perform Capillary Blood Glucose checks as needed.  Exercise Prescription Changes:   Exercise Comments:   Discharge Exercise Prescription (Final Exercise Prescription Changes):   Nutrition:  Target Goals: Understanding of nutrition guidelines, daily intake of sodium <1561m, cholesterol <2012m calories 30% from fat and 7% or less from  saturated fats, daily to have 5 or more servings of fruits and vegetables.  Biometrics:  Pre Biometrics - 02/07/16 1439    Pre Biometrics   Height 5' 11.5" (1.816 m)   Weight 227 lb (102.967 kg)   Waist Circumference 44.5 inches   Hip Circumference 41.5 inches   Waist to Hip Ratio 1.07 %   BMI (Calculated) 31.3   Single Leg Stand 5 seconds       Nutrition Therapy Plan and Nutrition Goals:   Nutrition Discharge: Rate Your Plate Scores:   Nutrition Goals Re-Evaluation:   Psychosocial: Target Goals: Acknowledge presence or absence of depression, maximize coping skills, provide positive support system. Participant is able to verbalize types and ability to use techniques and skills needed for reducing stress and depression.  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  States he can read, but may take him longer to understand what he is reading.     Family Dynamics   Good Support System? Yes  Fernie states his wife Vaughan Basta is very supportive.  He has one child and one grandchild, which he states they are not close.  No church affiliation.     Barriers   Psychosocial barriers to participate in program Psychosocial barriers identified (see note)  See above comments.  Kunal is currently on Celexa 64m once a day.  DJaironhas a history of depression and has been to counseling in the past.  He states he does not feel he needs counseling now.     Screening Interventions   Interventions Encouraged to exercise;Program counselor consult      Quality of Life Scores:     Quality of Life -  02/07/16 1524    Quality of Life Scores   Health/Function Pre 19.03 %   Socioeconomic Pre 23.29 %   Psych/Spiritual Pre 21.14 %   Family Pre 28.8 %   GLOBAL Pre 21.78 %      PHQ-9:     Recent Review Flowsheet Data    [facility]   Decreased Interest 2   Down, Depressed, Hopeless 1   PHQ - 2 Score 3   Altered sleeping 1   Tired, decreased energy 2   Change in appetite 0   Feeling bad or failure about yourself  2   Trouble concentrating 1   Moving slowly or fidgety/restless 2   Suicidal thoughts 0   PHQ-9 Score 11   Difficult doing work/chores Somewhat difficult      Psychosocial Evaluation and Intervention:     Psychosocial Evaluation - 02/14/16 0924    Psychosocial Evaluation & Interventions   Interventions Encouraged to exercise with the program and follow exercise prescription   Comments Counselor met with Mr. WTretheweytoday for initial psychosocial evaluation.  He is a 70year old who recently had open heart surgery.  His support system consists mostly of his spouse of 7 years.  He reports being generally healthy other than a stroke in 2013 which causes him to be a "little slow in answering questions."  Mr. WHovereports sleeping well and having a good appetite.  He admits to a history of depression following his stroke, but is on medications which are helping with this currently.  He  states his mood now is generally positive and he has minimal stress in his life other than his health and the realization of how that impacts his ability to do some things around the house, etc.  He has goals to "just feel better" and gain more energy.  He plans to walk more often for consistent exercise upon completion of this program.  Counselor will follow with Mr. Urbas throughout the course of this program.        Psychosocial Re-Evaluation:   Vocational Rehabilitation: Provide vocational rehab assistance to qualifying candidates.   Vocational Rehab  Evaluation & Intervention:     Vocational Rehab - 02/07/16 1513    Initial Vocational Rehab Evaluation & Intervention   Assessment shows need for Vocational Rehabilitation No  Patient is retired      Education: Education Goals: Education classes will be provided on a weekly basis, covering required topics. Participant will state understanding/return demonstration of topics presented.  Learning Barriers/Preferences:     Learning Barriers/Preferences - 02/07/16 1505    Learning Barriers/Preferences   Learning Barriers --  Had stroke in 2013 which affected his speech and writing.  Also, patient states he can read but it takes him longer to understand what he is reading.   Learning Preferences Individual Instruction      Education Topics: General Nutrition Guidelines/Fats and Fiber: -Group instruction provided by verbal, written material, models and posters to present the general guidelines for heart healthy nutrition. Gives an explanation and review of dietary fats and fiber.   Controlling Sodium/Reading Food Labels: -Group verbal and written material supporting the discussion of sodium use in heart healthy nutrition. Review and explanation with models, verbal and written materials for utilization of the food label.   Exercise Physiology & Risk Factors: - Group verbal and written instruction with models to review the exercise physiology of the cardiovascular system and associated critical values. Details cardiovascular disease risk factors and the goals associated with each risk factor.   Aerobic Exercise & Resistance Training: - Gives group verbal and written discussion on the health impact of inactivity. On the components of aerobic and resistive training programs and the benefits of this training and how to safely progress through these programs.   Flexibility, Balance, General Exercise Guidelines: - Provides group verbal and written instruction on the benefits of  flexibility and balance training programs. Provides general exercise guidelines with specific guidelines to those with heart or lung disease. Demonstration and skill practice provided.   Stress Management: - Provides group verbal and written instruction about the health risks of elevated stress, cause of high stress, and healthy ways to reduce stress.   Depression: - Provides group verbal and written instruction on the correlation between heart/lung disease and depressed mood, treatment options, and the stigmas associated with seeking treatment.   Anatomy & Physiology of the Heart: - Group verbal and written instruction and models provide basic cardiac anatomy and physiology, with the coronary electrical and arterial systems. Review of: AMI, Angina, Valve disease, Heart Failure, Cardiac Arrhythmia, Pacemakers, and the ICD.   Cardiac Procedures: - Group verbal and written instruction and models to describe the testing methods done to diagnose heart disease. Reviews the outcomes of the test results. Describes the treatment choices: Medical Management, Angioplasty, or Coronary Bypass Surgery.   Cardiac Medications: - Group verbal and written instruction to review commonly prescribed medications for heart disease. Reviews the medication, class of the drug, and side effects. Includes the steps to properly store meds and maintain  the prescription regimen.          Cardiac Rehab from 02/14/2016 in [facility] Cardiac and Pulmonary Rehab   Date  02/14/16   Educator  SB   Instruction Review Code  2- meets goals/outcomes [cardiac meds lecture 2]      Go Sex-Intimacy & Heart Disease, Get SMART - Goal Setting: - Group verbal and written instruction through game format to discuss heart disease and the return to sexual intimacy. Provides group verbal and written material to discuss and apply goal setting through the application of the S.M.A.R.T. Method.   Other Matters of the Heart: - Provides group  verbal, written materials and models to describe Heart Failure, Angina, Valve Disease, and Diabetes in the realm of heart disease. Includes description of the disease process and treatment options available to the cardiac patient.   Exercise & Equipment Safety: - Individual verbal instruction and demonstration of equipment use and safety with use of the equipment.      Cardiac Rehab from 02/14/2016 in [facility] Cardiac and Pulmonary Rehab   Date  02/07/16   Educator  D. Joya Gaskins, RN   Instruction Review Code  1- partially meets, needs review/practice      Infection Prevention: - Provides verbal and written material to individual with discussion of infection control including proper hand washing and proper equipment cleaning during exercise session.      Cardiac Rehab from 02/14/2016 in [facility] Cardiac and Pulmonary Rehab   Date  02/07/16   Educator  D. Joya Gaskins, RN   Instruction Review Code  2- meets goals/outcomes      Falls Prevention: - Provides verbal and written material to individual with discussion of falls prevention and safety.      Cardiac Rehab from 02/14/2016 in [facility] Cardiac and Pulmonary Rehab   Date  02/07/16   Educator  D. Joya Gaskins, RN   Instruction Review Code  1- partially meets, needs review/practice      Diabetes: - Individual verbal and written instruction to review signs/symptoms of diabetes, desired ranges of glucose level fasting, after meals and with exercise. Advice that pre and post exercise glucose checks will be done for 3 sessions at entry of program.      Cardiac Rehab from 02/14/2016 in [facility] Cardiac and Pulmonary Rehab   Date  02/07/16   Educator  D.Joya Gaskins, RN   Instruction Review Code  2- meets goals/outcomes       Knowledge Questionnaire Score:     Knowledge Questionnaire Score - 02/07/16 1510    Knowledge Questionnaire Score   Pre Score 22/28      Core Components/Risk Factors/Patient Goals at Admission:     Personal Goals and Risk Factors at  Admission - 02/07/16 1515    Core Components/Risk Factors/Patient Goals on Admission    Weight Management Weight Maintenance   Intervention Weight Management: Provide education and appropriate resources to help participant work on and attain dietary goals.   Admit Weight 227 lb (102.967 kg)   Goal Weight: Short Term 224 lb (101.606 kg)   Goal Weight: Long Term 224 lb (101.606 kg)   Expected Outcomes Short Term: Continue to assess and modify interventions until short term weight is achieved;Weight Maintenance: Understanding of the daily nutrition guidelines, which includes 25-35% calories from fat, 7% or less cal from saturated fats, less than 281m cholesterol, less than 1.5gm of sodium, & 5 or more servings of fruits and vegetables daily;Long Term: Adherence to nutrition and physical activity/exercise program aimed toward attainment of established  weight goal   Sedentary Yes   Intervention Provide advice, education, support and counseling about physical activity/exercise needs.;Develop an individualized exercise prescription for aerobic and resistive training based on initial evaluation findings, risk stratification, comorbidities and participant's personal goals.   Expected Outcomes Achievement of increased cardiorespiratory fitness and enhanced flexibility, muscular endurance and strength shown through measurements of functional capacity and personal statement of participant.   Increase Strength and Stamina Yes   Intervention Provide advice, education, support and counseling about physical activity/exercise needs.;Develop an individualized exercise prescription for aerobic and resistive training based on initial evaluation findings, risk stratification, comorbidities and participant's personal goals.   Expected Outcomes Achievement of increased cardiorespiratory fitness and enhanced flexibility, muscular endurance and strength shown through measurements of functional capacity and personal statement  of participant.   Diabetes Yes   Intervention Provide education about signs/symptoms and action to take for hypo/hyperglycemia.;Provide education about proper nutrition, including hydration, and aerobic/resistive exercise prescription along with prescribed medications to achieve blood glucose in normal ranges: Fasting glucose 65-99 mg/dL   Expected Outcomes Short Term: Participant verbalizes understanding of the signs/symptoms and immediate care of hyper/hypoglycemia, proper foot care and importance of medication, aerobic/resistive exercise and nutrition plan for blood glucose control.;Long Term: Attainment of HbA1C < 7%.   Hypertension Yes   Intervention Provide education on lifestyle modifcations including regular physical activity/exercise, weight management, moderate sodium restriction and increased consumption of fresh fruit, vegetables, and low fat dairy, alcohol moderation, and smoking cessation.;Monitor prescription use compliance.   Expected Outcomes Short Term: Continued assessment and intervention until BP is < 140/16m HG in hypertensive participants. < 130/824mHG in hypertensive participants with diabetes, heart failure or chronic kidney disease.;Long Term: Maintenance of blood pressure at goal levels.   Lipids Yes   Intervention Provide education and support for participant on nutrition & aerobic/resistive exercise along with prescribed medications to achieve LDL <709mHDL >59m49m Expected Outcomes Short Term: Participant states understanding of desired cholesterol values and is compliant with medications prescribed. Participant is following exercise prescription and nutrition guidelines.;Long Term: Cholesterol controlled with medications as prescribed, with individualized exercise RX and with personalized nutrition plan. Value goals: LDL < 70mg34mL > 40 mg.   Stress Yes   Intervention Offer individual and/or small group education and counseling on adjustment to heart disease, stress  management and health-related lifestyle change. Teach and support self-help strategies.;Refer participants experiencing significant psychosocial distress to appropriate mental health specialists for further evaluation and treatment. When possible, include family members and significant others in education/counseling sessions.   Expected Outcomes Short Term: Participant demonstrates changes in health-related behavior, relaxation and other stress management skills, ability to obtain effective social support, and compliance with psychotropic medications if prescribed.;Long Term: Emotional wellbeing is indicated by absence of clinically significant psychosocial distress or social isolation.      Core Components/Risk Factors/Patient Goals Review:      Goals and Risk Factor Review      02/14/16 1418           Core Components/Risk Factors/Patient Goals Review   Personal Goals Review Weight Management/Obesity;Sedentary;Increase Strength and Stamina;Diabetes;Hypertension;Lipids;Stress       Review DavidJoaqu[facility]ff [facility] better.  His weight, blood pressures, and blood sugars have all been stable. Today his fasting CBG was 137.  He has had no problems with his statin medication.  He is doing better with his stress levels as well.  Expected Outcomes Japheth will continue to come to exercise and education classes.  We will monitor blood pressure and continue to work with him on exercise and stress.          Core Components/Risk Factors/Patient Goals at Discharge (Final Review):      Goals and Risk Factor Review - 02/14/16 1418    Core Components/Risk Factors/Patient Goals Review   Personal Goals Review Weight Management/Obesity;Sedentary;Increase Strength and Stamina;Diabetes;Hypertension;Lipids;Stress   Review Nijee is off to a good start with exercise in [facility].  He is starting to feel better.  His weight, blood pressures, and blood  sugars have all been stable. Today his fasting CBG was 137.  He has had no problems with his statin medication.  He is doing better with his stress levels as well.   Expected Outcomes Jasmine will continue to come to exercise and education classes.  We will monitor blood pressure and continue to work with him on exercise and stress.      ITP Comments:     ITP Comments      02/16/16 0757           ITP Comments 30 day review. Continue with ITP          Comments:

## 2016-02-18 ENCOUNTER — Encounter: Payer: Medicare HMO | Admitting: *Deleted

## 2016-02-18 DIAGNOSIS — Z951 Presence of aortocoronary bypass graft: Secondary | ICD-10-CM | POA: Diagnosis not present

## 2016-02-18 LAB — GLUCOSE, CAPILLARY
Glucose-Capillary: 199 mg/dL — ABNORMAL HIGH (ref 65–99)
Glucose-Capillary: 218 mg/dL — ABNORMAL HIGH (ref 65–99)
Glucose-Capillary: 149 mg/dL — ABNORMAL HIGH (ref 65–99)

## 2016-02-18 NOTE — Progress Notes (Signed)
Daily Session Note  Patient Details  Name: Paul Bass MRN: [id number] Date of Birth: 10-05-1945 Referring Provider:        Cardiac Rehab from 02/07/2016 in [facility] Cardiac and Pulmonary Rehab   Referring Provider  Dwaine Deter MD.      Encounter Date: 02/18/2016  Check In:     Session Check In - 02/18/16 0848    Check-In   Location [facility]-Cardiac & Pulmonary Rehab   Staff Present Alberteen Sam, MA, ACSM RCEP, Exercise Physiologist;Amanda Oletta Darter, BA, ACSM CEP, Exercise Physiologist;Carroll Enterkin, RN, BSN   Supervising physician immediately available to respond to emergencies See telemetry face sheet for immediately available ER MD   Medication changes reported     No   Fall or balance concerns reported    No   Warm-up and Cool-down Performed on first and last piece of equipment   Resistance Training Performed Yes   VAD Patient? No   Pain Assessment   Currently in Pain? No/denies   Multiple Pain Sites No         Goals Met:  Independence with exercise equipment Exercise tolerated well No report of cardiac concerns or symptoms Strength training completed today  Goals Unmet:  Not Applicable  Comments: Pt able to follow exercise prescription today without complaint.  Will continue to monitor for progression.    Dr. Emily Filbert is Medical Director for [place] and [facility].

## 2016-02-21 ENCOUNTER — Encounter: Payer: Medicare HMO | Admitting: *Deleted

## 2016-02-21 DIAGNOSIS — Z951 Presence of aortocoronary bypass graft: Secondary | ICD-10-CM | POA: Diagnosis not present

## 2016-02-21 NOTE — Progress Notes (Signed)
Daily Session Note  Patient Details  Name: NIXON KOLTON MRN: [id number] Date of Birth: Mar 08, 1946 Referring Provider:   Flowsheet Row Cardiac Rehab from 02/07/2016 in [facility] Cardiac and Pulmonary Rehab  Referring Provider  Dwaine Deter MD.      Encounter Date: 02/21/2016  Check In:     Session Check In - 02/21/16 0917      Check-In   Location [facility]-Cardiac & Pulmonary Rehab   Staff Present Alberteen Sam, MA, ACSM RCEP, Exercise Physiologist;Kelly Amedeo Plenty, BS, ACSM CEP, Exercise Physiologist;Susanne Bice, RN, BSN, CCRP   Supervising physician immediately available to respond to emergencies See telemetry face sheet for immediately available ER MD   Medication changes reported     No   Fall or balance concerns reported    No   Warm-up and Cool-down Performed on first and last piece of equipment   Resistance Training Performed Yes   VAD Patient? No     Pain Assessment   Currently in Pain? No/denies   Multiple Pain Sites No         Goals Met:  Independence with exercise equipment Exercise tolerated well No report of cardiac concerns or symptoms Strength training completed today  Goals Unmet:  Not Applicable  Comments: Pt able to follow exercise prescription today without complaint.  Will continue to monitor for progression.    Dr. Emily Filbert is Medical Director for [place] and [facility].

## 2016-02-23 ENCOUNTER — Encounter: Payer: Medicare HMO | Admitting: *Deleted

## 2016-02-23 DIAGNOSIS — Z951 Presence of aortocoronary bypass graft: Secondary | ICD-10-CM | POA: Diagnosis not present

## 2016-02-23 NOTE — Progress Notes (Signed)
Daily Session Note  Patient Details  Name: Paul Bass MRN: [id number] Date of Birth: 13-Jun-1946 Referring Provider:   Flowsheet Row Cardiac Rehab from 02/07/2016 in [facility] Cardiac and Pulmonary Rehab  Referring Provider  Dwaine Deter MD.      Encounter Date: 02/23/2016  Check In:     Session Check In - 02/23/16 0840      Check-In   Location [facility]-Cardiac & Pulmonary Rehab   Staff Present Alberteen Sam, MA, ACSM RCEP, Exercise Physiologist;Amanda Oletta Darter, BA, ACSM CEP, Exercise Physiologist;Carroll Enterkin, RN, BSN   Supervising physician immediately available to respond to emergencies See telemetry face sheet for immediately available ER MD   Medication changes reported     No   Fall or balance concerns reported    No   Warm-up and Cool-down Performed on first and last piece of equipment   Resistance Training Performed Yes   VAD Patient? No     Pain Assessment   Currently in Pain? No/denies   Multiple Pain Sites No         Goals Met:  Independence with exercise equipment Exercise tolerated well No report of cardiac concerns or symptoms Strength training completed today  Goals Unmet:  Not Applicable  Comments: Pt able to follow exercise prescription today without complaint.  Will continue to monitor for progression.    Dr. Emily Filbert is Medical Director for [place] and [facility].

## 2016-02-24 ENCOUNTER — Ambulatory Visit (INDEPENDENT_AMBULATORY_CARE_PROVIDER_SITE_OTHER): Payer: Medicare HMO

## 2016-02-24 DIAGNOSIS — I6521 Occlusion and stenosis of right carotid artery: Secondary | ICD-10-CM | POA: Diagnosis not present

## 2016-02-25 ENCOUNTER — Encounter: Payer: Medicare HMO | Admitting: *Deleted

## 2016-02-25 DIAGNOSIS — Z951 Presence of aortocoronary bypass graft: Secondary | ICD-10-CM | POA: Diagnosis not present

## 2016-02-25 NOTE — Progress Notes (Signed)
Daily Session Note  Patient Details  Name: LINDLEY HINEY MRN: [id number] Date of Birth: [birth year] Referring Provider:   Flowsheet Row Cardiac Rehab from 02/07/2016 in [facility] Cardiac and Pulmonary Rehab  Referring Provider  Dwaine Deter MD.      Encounter Date: 02/25/2016  Check In:     Session Check In - 02/25/16 1023      Check-In   Location [facility]-Cardiac & Pulmonary Rehab   Staff Present Nyoka Cowden, RN, BSN, MA;Carroll Enterkin, RN, Levie Heritage, MA, ACSM RCEP, Exercise Physiologist   Supervising physician immediately available to respond to emergencies See telemetry face sheet for immediately available ER MD   Medication changes reported     No   Fall or balance concerns reported    No   Warm-up and Cool-down Performed on first and last piece of equipment   Resistance Training Performed Yes   VAD Patient? No     Pain Assessment   Currently in Pain? No/denies   Multiple Pain Sites No         Goals Met:  Independence with exercise equipment Exercise tolerated well Personal goals reviewed No report of cardiac concerns or symptoms Strength training completed today  Goals Unmet:  Not Applicable  Comments: Pt able to follow exercise prescription today without complaint.  Will continue to monitor for progression. Reviewed METs average and discussed progression with pt today.    Dr. Emily Filbert is Medical Director for [place] and [facility].

## 2016-02-28 ENCOUNTER — Encounter: Payer: Medicare HMO | Admitting: *Deleted

## 2016-02-28 DIAGNOSIS — Z951 Presence of aortocoronary bypass graft: Secondary | ICD-10-CM | POA: Diagnosis not present

## 2016-02-28 NOTE — Progress Notes (Signed)
Daily Session Note  Patient Details  Name: Paul Bass MRN: [id number] Date of Birth: 10-Jan-1946 Referring Provider:   Flowsheet Row Cardiac Rehab from 02/07/2016 in [facility] Cardiac and Pulmonary Rehab  Referring Provider  Dwaine Deter MD.      Encounter Date: 02/28/2016  Check In:     Session Check In - 02/28/16 0948      Check-In   Location [facility]-Cardiac & Pulmonary Rehab   Staff Present Heath Lark, RN, BSN, CCRP;Jessica [place], MA, ACSM RCEP, Exercise Physiologist;Kelly Amedeo Plenty, BS, ACSM CEP, Exercise Physiologist   Supervising physician immediately available to respond to emergencies See telemetry face sheet for immediately available ER MD   Medication changes reported     No   Fall or balance concerns reported    No   Warm-up and Cool-down Performed on first and last piece of equipment   Resistance Training Performed Yes   VAD Patient? No     Pain Assessment   Currently in Pain? No/denies   Multiple Pain Sites No         Goals Met:  Independence with exercise equipment Exercise tolerated well No report of cardiac concerns or symptoms Strength training completed today  Goals Unmet:  Not Applicable  Comments: Pt able to follow exercise prescription today without complaint.  Will continue to monitor for progression.    Dr. Emily Filbert is Medical Director for [place] and [facility].

## 2016-03-01 ENCOUNTER — Encounter: Payer: Medicare HMO | Attending: Cardiothoracic Surgery

## 2016-03-01 DIAGNOSIS — Z951 Presence of aortocoronary bypass graft: Secondary | ICD-10-CM | POA: Diagnosis not present

## 2016-03-01 NOTE — Progress Notes (Signed)
Daily Session Note  Patient Details  Name: Paul Bass MRN: [id number] Date of Birth: 02-16-1946 Referring Provider:   Flowsheet Row Cardiac Rehab from 02/07/2016 in [facility] Cardiac and Pulmonary Rehab  Referring Provider  Dwaine Deter MD.      Encounter Date: 03/01/2016  Check In:     Session Check In - 03/01/16 0900      Check-In   Location [facility]-Cardiac & Pulmonary Rehab   Staff Present Alberteen Sam, MA, ACSM RCEP, Exercise Physiologist;Camdon Saetern Oletta Darter, BA, ACSM CEP, Exercise Physiologist;Carroll Enterkin, RN, BSN   Supervising physician immediately available to respond to emergencies See telemetry face sheet for immediately available ER MD   Medication changes reported     No   Fall or balance concerns reported    No   Warm-up and Cool-down Performed on first and last piece of equipment   Resistance Training Performed Yes   VAD Patient? No     Pain Assessment   Currently in Pain? No/denies   Multiple Pain Sites No         Goals Met:  Independence with exercise equipment Exercise tolerated well Personal goals reviewed Strength training completed today  Goals Unmet:  Not Applicable  Comments: Pt able to follow exercise prescription today without complaint.  Will continue to monitor for progression.    Dr. Emily Filbert is Medical Director for [place] and [facility].

## 2016-03-03 ENCOUNTER — Encounter: Payer: Medicare HMO | Admitting: *Deleted

## 2016-03-03 DIAGNOSIS — Z951 Presence of aortocoronary bypass graft: Secondary | ICD-10-CM

## 2016-03-03 NOTE — Progress Notes (Addendum)
Daily Session Note  Patient Details  Name: Paul Bass MRN: [id number] Date of Birth: October 03, 1945 Referring Provider:   Flowsheet Row Cardiac Rehab from 02/07/2016 in [facility] Cardiac and Pulmonary Rehab  Referring Provider  Dwaine Deter MD.      Encounter Date: 03/03/2016  Check In:     Session Check In - 03/03/16 0907      Check-In   Staff Present Alberteen Sam, MA, ACSM RCEP, Exercise Physiologist;Jermel Artley, RN, BSN, CCRP;Carroll Enterkin, RN, BSN   Supervising physician immediately available to respond to emergencies See telemetry face sheet for immediately available ER MD   Medication changes reported     No   Fall or balance concerns reported    No   Warm-up and Cool-down Performed on first and last piece of equipment   Resistance Training Performed Yes   VAD Patient? No     Pain Assessment   Currently in Pain? No/denies         Goals Met:  Exercise tolerated well No report of cardiac concerns or symptoms Strength training completed today  Goals Unmet:  Not Applicable  Comments: Doing well with exercise prescription progression.  Reviewed home exercise with pt today.  Pt plans to walk and use treadmill at home for exercise.  Reviewed THR, pulse, RPE, sign and symptoms, and when to call 911 or MD.  Also discussed weather considerations and indoor options.  Pt voiced understanding. Alberteen Sam, MA, ACSM RCEP 03/03/2016 10:33 AM   Dr. Emily Filbert is Medical Director for [place] and [facility].

## 2016-03-08 DIAGNOSIS — Z951 Presence of aortocoronary bypass graft: Secondary | ICD-10-CM | POA: Diagnosis not present

## 2016-03-08 NOTE — Progress Notes (Signed)
Daily Session Note  Patient Details  Name: DUSTINE STICKLER MRN: [id number] Date of Birth: 1946/03/14 Referring Provider:   Flowsheet Row Cardiac Rehab from 02/07/2016 in [facility] Cardiac and Pulmonary Rehab  Referring Provider  Dwaine Deter MD.      Encounter Date: 03/08/2016  Check In:     Session Check In - 03/08/16 0824      Check-In   Location [facility]-Cardiac & Pulmonary Rehab   Staff Present Alberteen Sam, MA, ACSM RCEP, Exercise Physiologist;[place]vary Difranco Oletta Darter, BA, ACSM CEP, Exercise Physiologist;Carroll Enterkin, RN, BSN   Supervising physician immediately available to respond to emergencies See telemetry face sheet for immediately available ER MD   Medication changes reported     No   Fall or balance concerns reported    No   Warm-up and Cool-down Performed on first and last piece of equipment   Resistance Training Performed Yes   VAD Patient? No     Pain Assessment   Currently in Pain? No/denies   Multiple Pain Sites No         Goals Met:  Independence with exercise equipment Exercise tolerated well Strength training completed today  Goals Unmet:  Not Applicable  Comments: Pt able to follow exercise prescription today without complaint.  Will continue to monitor for progression.    Dr. Emily Filbert is Medical Director for [place] and [facility].

## 2016-03-10 ENCOUNTER — Encounter: Payer: Medicare HMO | Admitting: *Deleted

## 2016-03-10 DIAGNOSIS — Z951 Presence of aortocoronary bypass graft: Secondary | ICD-10-CM | POA: Diagnosis not present

## 2016-03-10 NOTE — Progress Notes (Signed)
Daily Session Note  Patient Details  Name: LYELL CLUGSTON MRN: [id number] Date of Birth: [birth year] Referring Provider:   Flowsheet Row Cardiac Rehab from 02/07/2016 in [facility] Cardiac and Pulmonary Rehab  Referring Provider  Dwaine Deter MD.      Encounter Date: 03/10/2016  Check In:     Session Check In - 03/10/16 0835      Check-In   Location [facility]-Cardiac & Pulmonary Rehab   Staff Present Gerlene Burdock, RN, BSN;Susanne Bice, RN, BSN, CCRP;Jessica Luan Pulling, MA, ACSM RCEP, Exercise Physiologist   Supervising physician immediately available to respond to emergencies See telemetry face sheet for immediately available ER MD   Medication changes reported     No   Fall or balance concerns reported    No   Warm-up and Cool-down Performed on first and last piece of equipment   Resistance Training Performed Yes   VAD Patient? No     Pain Assessment   Currently in Pain? No/denies           Exercise Prescription Changes - 03/09/16 1100      Exercise Review   Progression Yes     Response to Exercise   Blood Pressure (Admit) 116/53   Blood Pressure (Exercise) 140/58   Blood Pressure (Exit) 112/62   Heart Rate (Admit) 59 bpm   Heart Rate (Exercise) 110 bpm   Heart Rate (Exit) 56 bpm   Rating of Perceived Exertion (Exercise) 12   Symptoms none   Comments Home Exercise Guidelines given 03/03/16   Duration Progress to 45 minutes of aerobic exercise without signs/symptoms of physical distress   Intensity THRR unchanged     Progression   Progression Continue to progress workloads to maintain intensity without signs/symptoms of physical distress.   Average METs 3.12     Resistance Training   Training Prescription Yes   Weight 3 lbs   Reps 10-12     Interval Training   Interval Training No     Treadmill   MPH 2   Grade 1   Minutes 15   METs 2.81     Bike   Level 1.5   Minutes 15   METs 3.75     T5 Nustep   Level 3   Minutes 15   METs 2.8     Home Exercise  Plan   Plans to continue exercise at Home  walking and treadmill   Frequency Add 3 additional days to program exercise sessions.      Goals Met:  Exercise tolerated well  Goals Unmet:  RPE  Comments:     Dr. Emily Filbert is Medical Director for [place] and [facility].

## 2016-03-13 ENCOUNTER — Encounter: Payer: Medicare HMO | Admitting: *Deleted

## 2016-03-13 DIAGNOSIS — Z951 Presence of aortocoronary bypass graft: Secondary | ICD-10-CM

## 2016-03-13 NOTE — Progress Notes (Signed)
Daily Session Note  Patient Details  Name: Paul Bass MRN: [id number] Date of Birth: 05-Jan-1946 Referring Provider:   Flowsheet Row Cardiac Rehab from 02/07/2016 in [facility] Cardiac and Pulmonary Rehab  Referring Provider  Dwaine Deter MD.      Encounter Date: 03/13/2016  Check In:     Session Check In - 03/13/16 0806      Check-In   Location [facility]-Cardiac & Pulmonary Rehab   Staff Present Alberteen Sam, MA, ACSM RCEP, Exercise Physiologist;Kelly Amedeo Plenty, BS, ACSM CEP, Exercise Physiologist;Carroll Enterkin, RN, BSN   Supervising physician immediately available to respond to emergencies See telemetry face sheet for immediately available ER MD   Medication changes reported     No   Fall or balance concerns reported    No   Warm-up and Cool-down Performed on first and last piece of equipment   Resistance Training Performed Yes   VAD Patient? No     Pain Assessment   Currently in Pain? No/denies   Multiple Pain Sites No         Goals Met:  Independence with exercise equipment Exercise tolerated well No report of cardiac concerns or symptoms Strength training completed today  Goals Unmet:  Not Applicable  Comments: Pt able to follow exercise prescription today without complaint.  Will continue to monitor for progression.    Dr. Emily Filbert is Medical Director for [place] and [facility].

## 2016-03-15 ENCOUNTER — Encounter: Payer: Self-pay | Admitting: *Deleted

## 2016-03-15 DIAGNOSIS — Z951 Presence of aortocoronary bypass graft: Secondary | ICD-10-CM | POA: Diagnosis not present

## 2016-03-15 NOTE — Progress Notes (Signed)
Cardiac Individual Treatment Plan  Patient Details  Name: Paul Bass MRN: [id number] Date of Birth: 1946-07-07 Referring Provider:   Flowsheet Row Cardiac Rehab from 02/07/2016 in [facility] Cardiac and Pulmonary Rehab  Referring Provider  Dwaine Deter MD.      Initial Encounter Date:  Flowsheet Row Cardiac Rehab from 02/07/2016 in [facility] Cardiac and Pulmonary Rehab  Date  02/07/16  Referring Provider  Dwaine Deter MD.      Visit Diagnosis: S/P CABG x 3  Patient's Home Medications on Admission:  Current Outpatient Prescriptions:  .  albuterol (PROVENTIL HFA;VENTOLIN HFA) 108 (90 BASE) MCG/ACT inhaler, Inhale 2 puffs into the lungs every 6 (six) hours as needed for wheezing or shortness of breath. For shortness of breath, Disp: , Rfl:  .  amiodarone (PACERONE) 200 MG tablet, TAKE 2 TABLETS (400 MG TOTAL) BY MOUTH DAILY., Disp: , Rfl: 11 .  aspirin EC 81 MG tablet, Take 81 mg by mouth daily., Disp: , Rfl:  .  atorvastatin (LIPITOR) 80 MG tablet, Take 80 mg by mouth., Disp: , Rfl:  .  budesonide-formoterol (SYMBICORT) 160-4.5 MCG/ACT inhaler, Inhale 2 puffs into the lungs 2 (two) times daily., Disp: , Rfl:  .  cholecalciferol (VITAMIN D) 400 UNITS TABS, Take 400 Units by mouth 2 (two) times daily., Disp: , Rfl:  .  citalopram (CELEXA) 20 MG tablet, Take 20 mg by mouth daily., Disp: , Rfl:  .  clonazePAM (KLONOPIN) 0.5 MG tablet, Take 0.5 mg by mouth 2 (two) times daily as needed for anxiety., Disp: , Rfl:  .  clopidogrel (PLAVIX) 75 MG tablet, TAKE 1 TABLET (75 MG TOTAL) BY MOUTH ONCE DAILY., Disp: , Rfl: 1 .  Coenzyme Q10 (CO Q-10) 200 MG CAPS, Take 1 capsule by mouth daily., Disp: , Rfl:  .  glucose blood test strip, , Disp: , Rfl:  .  levothyroxine (SYNTHROID, LEVOTHROID) 100 MCG tablet, Take 100 mcg by mouth daily before breakfast., Disp: , Rfl:  .  losartan (COZAAR) 25 MG tablet, Take by mouth., Disp: , Rfl:  .  metFORMIN (GLUCOPHAGE) 500 MG tablet, TAKE 1 TABLET BY MOUTH  ONCE A DAY, Disp: , Rfl:  .  Multiple Vitamin (MULTIVITAMIN WITH MINERALS) TABS, Take 0.5 tablets by mouth 2 (two) times daily., Disp: , Rfl:  .  Omega-3 Fatty Acids (FISH OIL) 1200 MG CAPS, Take 1,000 mg by mouth 2 (two) times daily., Disp: , Rfl:  .  omeprazole (PRILOSEC) 20 MG capsule, Take 20 mg by mouth daily., Disp: , Rfl:  .  PSYLLIUM PO, Take by mouth., Disp: , Rfl:  .  tamsulosin (FLOMAX) 0.4 MG CAPS capsule, Take 0.4 mg by mouth daily. , Disp: , Rfl:   Past Medical History: Past Medical History:  Diagnosis Date  . Anxiety   . BPH (benign prostatic hypertrophy)   . CAD S/P percutaneous coronary angioplasty   . COPD (chronic obstructive pulmonary disease) ([place])   . Depression   . HTN (hypertension)   . Hyperlipidemia   . Hypothyroidism   . Stroke ([place])   . Tobacco abuse     Tobacco Use: History  Smoking Status  . Former Smoker  . Packs/day: 2.00  . Types: Cigarettes  . Quit date: 05/20/2012  Smokeless Tobacco  . Never Used    Labs: Recent Review Flowsheet Data    Labs for ITP Cardiac and Pulmonary Rehab Latest Ref Rng & Units 05/12/2012 05/12/2012 05/13/2012   Cholestrol 0 - 200 mg/dL - - 119  LDLCALC 0 - 99 mg/dL - - 64   HDL >39 mg/dL - - 40   Trlycerides <150 mg/dL - - 76   Hemoglobin A1c <5.7 % - - 6.3(H)   PHART 7.350 - 7.450 7.243(L) 7.342(L) -   PCO2ART 35.0 - 45.0 mmHg 55.0(H) 48.4(H) -   HCO3 20.0 - 24.0 mEq/L 22.9 25.5(H) -   TCO2 0 - 100 mmol/L 24.6 27.0 -   ACIDBASEDEF 0.0 - 2.0 mmol/L 3.4(H) - -   O2SAT % 99.2 98.0 -       Exercise Target Goals:    Exercise Program Goal: Individual exercise prescription set with THRR, safety & activity barriers. Participant demonstrates ability to understand and report RPE using BORG scale, to self-measure pulse accurately, and to acknowledge the importance of the exercise prescription.  Exercise Prescription Goal: Starting with aerobic activity 30 plus minutes a day, 3 days per week for initial exercise  prescription. Provide home exercise prescription and guidelines that participant acknowledges understanding prior to discharge.  Activity Barriers & Risk Stratification:     Activity Barriers & Cardiac Risk Stratification - 02/07/16 1502      Activity Barriers & Cardiac Risk Stratification   Activity Barriers Back Problems;Muscular Weakness;Shortness of Breath   Cardiac Risk Stratification High      6 Minute Walk:     6 Minute Walk    Row Name 02/07/16 1429         6 Minute Walk   Phase Initial     Distance 1086 feet     Walk Time 6 minutes     # of Rest Breaks 0     MPH 2     METS 2.58     RPE 13     Symptoms No     Resting HR 58 bpm     Resting BP 114/66     Max Ex. HR 70 bpm     Max Ex. BP 140/70        Initial Exercise Prescription:     Initial Exercise Prescription - 02/07/16 1400      Date of Initial Exercise RX and Referring Provider   Date 02/07/16   Referring Provider Dwaine Deter MD.     Treadmill   MPH 2   Grade 0   Minutes 15   METs 2.5     Bike   Level 2   Minutes 15   METs 3     NuStep   Level 3   Minutes 15   METs 3.5     REL-XR   Level 3   Minutes 15   METs 3     Prescription Details   Frequency (times per week) 3   Duration Progress to 30 minutes of continuous aerobic without signs/symptoms of physical distress     Intensity   THRR 40-80% of Max Heartrate 109-139   Ratings of Perceived Exertion 11-13   Perceived Dyspnea 0-4     Progression   Progression Continue progressive overload as per policy without signs/symptoms or physical distress.     Resistance Training   Training Prescription Yes   Weight 2   Reps 10-12      Perform Capillary Blood Glucose checks as needed.  Exercise Prescription Changes:     Exercise Prescription Changes    Row Name 02/23/16 1100 03/03/16 1000 03/09/16 1100         Exercise Review   Progression Yes Yes Yes       Response to  Exercise   Blood Pressure (Admit) 120/64  -  116/53     Blood Pressure (Exercise) 136/64  - 140/58     Blood Pressure (Exit) 120/64  - 112/62     Heart Rate (Admit) 70 bpm  - 59 bpm     Heart Rate (Exercise) 112 bpm  - 110 bpm     Heart Rate (Exit) 60 bpm  - 56 bpm     Rating of Perceived Exertion (Exercise) 12  - 12     Symptoms none none none     Comments  - Home Exercise Guidelines given 03/03/16 Home Exercise Guidelines given 03/03/16     Duration Progress to 45 minutes of aerobic exercise without signs/symptoms of physical distress Progress to 45 minutes of aerobic exercise without signs/symptoms of physical distress Progress to 45 minutes of aerobic exercise without signs/symptoms of physical distress     Intensity THRR unchanged THRR unchanged THRR unchanged       Progression   Progression Continue to progress workloads to maintain intensity without signs/symptoms of physical distress. Continue to progress workloads to maintain intensity without signs/symptoms of physical distress. Continue to progress workloads to maintain intensity without signs/symptoms of physical distress.     Average METs 2.99 2.99 3.12       Resistance Training   Training Prescription Yes Yes Yes     Weight 3 lbs 3 lbs 3 lbs     Reps 10-12 10-12 10-12       Interval Training   Interval Training No No No       Treadmill   MPH '2 2 2     '$ Grade '1 1 1     '$ Minutes '15 15 15     '$ METs 2.81 2.81 2.81       Bike   Level 1.5 1.5 1.5     Minutes '15 15 15     '$ METs 3.75 3.75 3.75       NuStep   Level 3 3  -     Minutes 15 15  -     METs 2.4 2.4  -       T5 Nustep   Level  -  - 3     Minutes  -  - 15     METs  -  - 2.8       Home Exercise Plan   Plans to continue exercise at  - Home  walking and treadmill Home  walking and treadmill     Frequency  - Add 3 additional days to program exercise sessions. Add 3 additional days to program exercise sessions.        Exercise Comments:     Exercise Comments    Row Name 02/23/16 1120 02/25/16 1024  03/03/16 1033 03/09/16 1113 03/10/16 0957   Exercise Comments Cardin is off to a good start with exercise.  He is now able to do all 30 or 45 min continuos.  Today we added more incline to treadmill.  We will continue to monitor for progression. Reviewed METs average and discussed progression with pt today. Reviewed home exercise with pt today.  Pt plans to walk and use treadmill at home for exercise.  Reviewed THR, pulse, RPE, sign and symptoms, and when to call 911 or MD.  Also discussed weather considerations and indoor options.  Pt voiced understanding. Sparrow is doing well with exercise.  He has noticed a difference for himself.  He is starting to want to push  himself more.  We will continue to monitor. Reviewed METs average and discussed progression with pt today.      Discharge Exercise Prescription (Final Exercise Prescription Changes):     Exercise Prescription Changes - 03/09/16 1100      Exercise Review   Progression Yes     Response to Exercise   Blood Pressure (Admit) 116/53   Blood Pressure (Exercise) 140/58   Blood Pressure (Exit) 112/62   Heart Rate (Admit) 59 bpm   Heart Rate (Exercise) 110 bpm   Heart Rate (Exit) 56 bpm   Rating of Perceived Exertion (Exercise) 12   Symptoms none   Comments Home Exercise Guidelines given 03/03/16   Duration Progress to 45 minutes of aerobic exercise without signs/symptoms of physical distress   Intensity THRR unchanged     Progression   Progression Continue to progress workloads to maintain intensity without signs/symptoms of physical distress.   Average METs 3.12     Resistance Training   Training Prescription Yes   Weight 3 lbs   Reps 10-12     Interval Training   Interval Training No     Treadmill   MPH 2   Grade 1   Minutes 15   METs 2.81     Bike   Level 1.5   Minutes 15   METs 3.75     T5 Nustep   Level 3   Minutes 15   METs 2.8     Home Exercise Plan   Plans to continue exercise at Home  walking and  treadmill   Frequency Add 3 additional days to program exercise sessions.      Nutrition:  Target Goals: Understanding of nutrition guidelines, daily intake of sodium '1500mg'$ , cholesterol '200mg'$ , calories 30% from fat and 7% or less from saturated fats, daily to have 5 or more servings of fruits and vegetables.  Biometrics:     Pre Biometrics - 02/07/16 1439      Pre Biometrics   Height 5' 11.5" (1.816 m)   Weight 227 lb (103 kg)   Waist Circumference 44.5 inches   Hip Circumference 41.5 inches   Waist to Hip Ratio 1.07 %   BMI (Calculated) 31.3   Single Leg Stand 5 seconds       Nutrition Therapy Plan and Nutrition Goals:     Nutrition Therapy & Goals - 02/23/16 1118      Personal Nutrition Goals   Additional Goals? No   Comments Pt declined individual nutrition appt.      Nutrition Discharge: Rate Your Plate Scores:     Nutrition Assessments - 03/13/16 1541      Rate Your Plate Scores   Pre Score 56   Pre Score % 62.2 %      Nutrition Goals Re-Evaluation:   Psychosocial: Target Goals: Acknowledge presence or absence of depression, maximize coping skills, provide positive support system. Participant is able to verbalize types and ability to use techniques and skills needed for reducing stress and depression.  Initial Review & Psychosocial Screening:     Initial Psych Review & Screening - 02/07/16 1518      Initial Review   Current issues with Current Depression;History of Depression;Current Psychotropic Meds;Current Sleep Concerns;Current Stress Concerns   Source of Stress Concerns Unable to perform yard/household activities;Chronic Illness   Comments Paul Bass had a stroke in 2013 which affected his speech; ability to write; and comprehension.  States he can read, but may take him longer to understand what he is  reading.       Family Dynamics   Good Support System? Yes  Paul Bass states his wife Vaughan Basta is very supportive.  He has one child and one grandchild,  which he states they are not close.  No church affiliation.       Barriers   Psychosocial barriers to participate in program Psychosocial barriers identified (see note)  See above comments.  Paul Bass is currently on Celexa '20mg'$  once a day.  Paul Bass has a history of depression and has been to counseling in the past.  He states he does not feel he needs counseling now.       Screening Interventions   Interventions Encouraged to exercise;Program counselor consult      Quality of Life Scores:     Quality of Life - 02/07/16 1524      Quality of Life Scores   Health/Function Pre 19.03 %   Socioeconomic Pre 23.29 %   Psych/Spiritual Pre 21.14 %   Family Pre 28.8 %   GLOBAL Pre 21.78 %      PHQ-9: Recent Review Flowsheet Data    Depression screen [facility] 2/9 02/07/2016   Decreased Interest 2   Down, Depressed, Hopeless 1   PHQ - 2 Score 3   Altered sleeping 1   Tired, decreased energy 2   Change in appetite 0   Feeling bad or failure about yourself  2   Trouble concentrating 1   Moving slowly or fidgety/restless 2   Suicidal thoughts 0   PHQ-9 Score 11   Difficult doing work/chores Somewhat difficult      Psychosocial Evaluation and Intervention:     Psychosocial Evaluation - 02/14/16 0924      Psychosocial Evaluation & Interventions   Interventions Encouraged to exercise with the program and follow exercise prescription   Comments Counselor met with Paul Bass today for initial psychosocial evaluation.  He is a 70 year old who recently had open heart surgery.  His support system consists mostly of his spouse of 7 years.  He reports being generally healthy other than a stroke in 2013 which causes him to be a "little slow in answering questions."  Paul Bass reports sleeping well and having a good appetite.  He admits to a history of depression following his stroke, but is on medications which are helping with this currently.  He states his mood now is generally positive and he has  minimal stress in his life other than his health and the realization of how that impacts his ability to do some things around the house, etc.  He has goals to "just feel better" and gain more energy.  He plans to walk more often for consistent exercise upon completion of this program.  Counselor will follow with Paul Bass throughout the course of this program.        Psychosocial Re-Evaluation:     Psychosocial Re-Evaluation    Row Name 02/23/16 1001 02/28/16 0917 03/03/16 0901 03/08/16 0945       Psychosocial Re-Evaluation   Interventions Therapist referral  -  -  -    Comments Counselor follow up with Paul Bass today following an education workshop on Depression.  He stated "I could be a poster child for this" and requested to meet with this counselor.  In assessing Paul Bass's mood and current symptoms, it is obvious his medications are not being fully effective at this time. Counselor recommended Paul Bass call his doctor about increasing his current dosage of Celexa,  and counselor also recommended Paul Bass see a therapist.  Counselor provided contact information for a local therapist, and he is to call to see if she takes his insurance.  Counselor also recommended that Paul Bass spouse accompany him (with his approval) to at least the initial appointment since he has difficulty speaking at times and she could help provide a more thorough picture of symptoms and how his mood impacts his life at this time.  Counselor will continue to follow with Paul Bass.   Paul Bass stated today that he has an appointment with his Dr. to discuss his mood next week.  He will subsequently call a counselor following that appointment but wanted to check with his dr. first.  Counselor commended Paul Bass for being proactive and will continue to follow up with him.   Tameron states he is able to do more at home since startiing Cardiac Rehab. Was able to do his house chores without problem, He also stated that he is  feeling better overall since starting the program  Counselor follow up with Paul Bass today reporting he met with his doctor about his medication needs to treat his mood.  He stated his Dr. began him on Bupropion '150mg'$  and he started it two days ago.  So far no negative side effects.  Dr. also stated it could be increased to 300 mg if needed.  Paul Bass continues to take the other medication for mood and it was at max. prescribed dosage so could not be increased - per his doctor's recommendation.  Paul Bass is also actively trying to find a therapist that takes his insurance, but is having a hard time currently.  This counselor will continue to follow with Paul Bass. [place] about this to see if his mood improves and if any success in finding a therapist.      Continued Psychosocial Services Needed Yes  Paul Bass. Parco was given contact information for a counselor and encouraged to speak with his Dr. about increased medication.  Counselor will follow with him while in this program.    -  -  -       Vocational Rehabilitation: Provide vocational rehab assistance to qualifying candidates.   Vocational Rehab Evaluation & Intervention:     Vocational Rehab - 02/07/16 1513      Initial Vocational Rehab Evaluation & Intervention   Assessment shows need for Vocational Rehabilitation No  Patient is retired      Education: Education Goals: Education classes will be provided on a weekly basis, covering required topics. Participant will state understanding/return demonstration of topics presented.  Learning Barriers/Preferences:     Learning Barriers/Preferences - 02/07/16 1505      Learning Barriers/Preferences   Learning Barriers --  Had stroke in 2013 which affected his speech and writing.  Also, patient states he can read but it takes him longer to understand what he is reading.   Learning Preferences Individual Instruction      Education Topics: General Nutrition Guidelines/Fats and Fiber: -Group  instruction provided by verbal, written material, models and posters to present the general guidelines for heart healthy nutrition. Gives an explanation and review of dietary fats and fiber. Flowsheet Row Cardiac Rehab from 03/13/2016 in [facility] Cardiac and Pulmonary Rehab  Date  02/21/16  Educator  CR  Instruction Review Code  2- meets goals/outcomes      Controlling Sodium/Reading Food Labels: -Group verbal and written material supporting the discussion of sodium use in heart healthy nutrition. Review and  explanation with models, verbal and written materials for utilization of the food label.   Exercise Physiology & Risk Factors: - Group verbal and written instruction with models to review the exercise physiology of the cardiovascular system and associated critical values. Details cardiovascular disease risk factors and the goals associated with each risk factor.   Aerobic Exercise & Resistance Training: - Gives group verbal and written discussion on the health impact of inactivity. On the components of aerobic and resistive training programs and the benefits of this training and how to safely progress through these programs. Flowsheet Row Cardiac Rehab from 03/13/2016 in [facility] Cardiac and Pulmonary Rehab  Date  03/08/16  Educator  [facility]  Instruction Review Code  2- meets goals/outcomes      Flexibility, Balance, General Exercise Guidelines: - Provides group verbal and written instruction on the benefits of flexibility and balance training programs. Provides general exercise guidelines with specific guidelines to those with heart or lung disease. Demonstration and skill practice provided. Flowsheet Row Cardiac Rehab from 03/13/2016 in [facility] Cardiac and Pulmonary Rehab  Date  03/13/16  Educator  [facility]  Instruction Review Code  2- meets goals/outcomes      Stress Management: - Provides group verbal and written instruction about the health risks of elevated stress, cause of high stress, and  healthy ways to reduce stress.   Depression: - Provides group verbal and written instruction on the correlation between heart/lung disease and depressed mood, treatment options, and the stigmas associated with seeking treatment. Flowsheet Row Cardiac Rehab from 03/13/2016 in [facility] Cardiac and Pulmonary Rehab  Date  02/23/16  Educator  [facility]  Instruction Review Code  2- meets goals/outcomes      Anatomy & Physiology of the Heart: - Group verbal and written instruction and models provide basic cardiac anatomy and physiology, with the coronary electrical and arterial systems. Review of: AMI, Angina, Valve disease, Heart Failure, Cardiac Arrhythmia, Pacemakers, and the ICD.   Cardiac Procedures: - Group verbal and written instruction and models to describe the testing methods done to diagnose heart disease. Reviews the outcomes of the test results. Describes the treatment choices: Medical Management, Angioplasty, or Coronary Bypass Surgery.   Cardiac Medications: - Group verbal and written instruction to review commonly prescribed medications for heart disease. Reviews the medication, class of the drug, and side effects. Includes the steps to properly store meds and maintain the prescription regimen. Flowsheet Row Cardiac Rehab from 03/13/2016 in [facility] Cardiac and Pulmonary Rehab  Date  02/14/16  Educator  SB  Instruction Review Code  2- meets goals/outcomes [cardiac meds lecture 2]      Go Sex-Intimacy & Heart Disease, Get SMART - Goal Setting: - Group verbal and written instruction through game format to discuss heart disease and the return to sexual intimacy. Provides group verbal and written material to discuss and apply goal setting through the application of the S.M.A.R.T. Method.   Other Matters of the Heart: - Provides group verbal, written materials and models to describe Heart Failure, Angina, Valve Disease, and Diabetes in the realm of heart disease. Includes description of the  disease process and treatment options available to the cardiac patient.   Exercise & Equipment Safety: - Individual verbal instruction and demonstration of equipment use and safety with use of the equipment. Flowsheet Row Cardiac Rehab from 03/13/2016 in [facility] Cardiac and Pulmonary Rehab  Date  02/07/16  Educator  D. Joya Gaskins, RN  Instruction Review Code  1- partially meets, needs review/practice  Infection Prevention: - Provides verbal and written material to individual with discussion of infection control including proper hand washing and proper equipment cleaning during exercise session. Flowsheet Row Cardiac Rehab from 03/13/2016 in [facility] Cardiac and Pulmonary Rehab  Date  02/07/16  Educator  D. Joya Gaskins, RN  Instruction Review Code  2- meets goals/outcomes      Falls Prevention: - Provides verbal and written material to individual with discussion of falls prevention and safety. Flowsheet Row Cardiac Rehab from 03/13/2016 in [facility] Cardiac and Pulmonary Rehab  Date  02/07/16  Educator  D. Joya Gaskins, RN  Instruction Review Code  1- partially meets, needs review/practice      Diabetes: - Individual verbal and written instruction to review signs/symptoms of diabetes, desired ranges of glucose level fasting, after meals and with exercise. Advice that pre and post exercise glucose checks will be done for 3 sessions at entry of program. [place] from 03/13/2016 in [facility] Cardiac and Pulmonary Rehab  Date  02/07/16  Educator  D.Joya Gaskins, RN  Instruction Review Code  2- meets goals/outcomes       Knowledge Questionnaire Score:     Knowledge Questionnaire Score - 02/07/16 1510      Knowledge Questionnaire Score   Pre Score 22/28      Core Components/Risk Factors/Patient Goals at Admission:     Personal Goals and Risk Factors at Admission - 02/07/16 1515      Core Components/Risk Factors/Patient Goals on Admission    Weight Management Weight Maintenance    Intervention Weight Management: Provide education and appropriate resources to help participant work on and attain dietary goals.   Admit Weight 227 lb (103 kg)   Goal Weight: Short Term 224 lb (101.6 kg)   Goal Weight: Long Term 224 lb (101.6 kg)   Expected Outcomes Short Term: Continue to assess and modify interventions until short term weight is achieved;Weight Maintenance: Understanding of the daily nutrition guidelines, which includes 25-35% calories from fat, 7% or less cal from saturated fats, less than '200mg'$  cholesterol, less than 1.5gm of sodium, & 5 or more servings of fruits and vegetables daily;Long Term: Adherence to nutrition and physical activity/exercise program aimed toward attainment of established weight goal   Sedentary Yes   Intervention Provide advice, education, support and counseling about physical activity/exercise needs.;Develop an individualized exercise prescription for aerobic and resistive training based on initial evaluation findings, risk stratification, comorbidities and participant's personal goals.   Expected Outcomes Achievement of increased cardiorespiratory fitness and enhanced flexibility, muscular endurance and strength shown through measurements of functional capacity and personal statement of participant.   Increase Strength and Stamina Yes   Intervention Provide advice, education, support and counseling about physical activity/exercise needs.;Develop an individualized exercise prescription for aerobic and resistive training based on initial evaluation findings, risk stratification, comorbidities and participant's personal goals.   Expected Outcomes Achievement of increased cardiorespiratory fitness and enhanced flexibility, muscular endurance and strength shown through measurements of functional capacity and personal statement of participant.   Diabetes Yes   Intervention Provide education about signs/symptoms and action to take for hypo/hyperglycemia.;Provide  education about proper nutrition, including hydration, and aerobic/resistive exercise prescription along with prescribed medications to achieve blood glucose in normal ranges: Fasting glucose 65-99 mg/dL   Expected Outcomes Short Term: Participant verbalizes understanding of the signs/symptoms and immediate care of hyper/hypoglycemia, proper foot care and importance of medication, aerobic/resistive exercise and nutrition plan for blood glucose control.;Long Term: Attainment of HbA1C < 7%.   Hypertension Yes  Intervention Provide education on lifestyle modifcations including regular physical activity/exercise, weight management, moderate sodium restriction and increased consumption of fresh fruit, vegetables, and low fat dairy, alcohol moderation, and smoking cessation.;Monitor prescription use compliance.   Expected Outcomes Short Term: Continued assessment and intervention until BP is < 140/47m HG in hypertensive participants. < 130/855mHG in hypertensive participants with diabetes, heart failure or chronic kidney disease.;Long Term: Maintenance of blood pressure at goal levels.   Lipids Yes   Intervention Provide education and support for participant on nutrition & aerobic/resistive exercise along with prescribed medications to achieve LDL '70mg'$ , HDL >'40mg'$ .   Expected Outcomes Short Term: Participant states understanding of desired cholesterol values and is compliant with medications prescribed. Participant is following exercise prescription and nutrition guidelines.;Long Term: Cholesterol controlled with medications as prescribed, with individualized exercise RX and with personalized nutrition plan. Value goals: LDL < '70mg'$ , HDL > 40 mg.   Stress Yes   Intervention Offer individual and/or small group education and counseling on adjustment to heart disease, stress management and health-related lifestyle change. Teach and support self-help strategies.;Refer participants experiencing significant  psychosocial distress to appropriate mental health specialists for further evaluation and treatment. When possible, include family members and significant others in education/counseling sessions.   Expected Outcomes Short Term: Participant demonstrates changes in health-related behavior, relaxation and other stress management skills, ability to obtain effective social support, and compliance with psychotropic medications if prescribed.;Long Term: Emotional wellbeing is indicated by absence of clinically significant psychosocial distress or social isolation.      Core Components/Risk Factors/Patient Goals Review:      Goals and Risk Factor Review    Row Name 02/14/16 1418 03/01/16 1025           Core Components/Risk Factors/Patient Goals Review   Personal Goals Review Weight Management/Obesity;Sedentary;Increase Strength and Stamina;Diabetes;Hypertension;Lipids;Stress Weight Management/Obesity;Sedentary;Increase Strength and Stamina;Diabetes;Hypertension;Lipids      Review [facility] is starting to feel better.  His weight, blood pressures, and blood sugars have all been stable. Today his fasting CBG was 137.  He has had no problems with his statin medication.  He is doing better with his stress levels as well. Kayzen's weight has been fairly steady during the program.  His blood pressure are now running in the 110-120s/60s at rest.  He does not check his blod sugar at home, but has not noticed any highs or lows.  He had labs drawn on Monday for an appt next week.  He is not currently exercisng at home.      Expected Outcomes DaJahmalill continue to come to exercise and education classes.  We will monitor blood pressure and continue to work with him on exercise and stress. DaLarinill continue to come to exercise and education classes.  We will discuss home exercise plans soon.           Core Components/Risk Factors/Patient Goals at Discharge (Final  Review):      Goals and Risk Factor Review - 03/01/16 1025      Core Components/Risk Factors/Patient Goals Review   Personal Goals Review Weight Management/Obesity;Sedentary;Increase Strength and Stamina;Diabetes;Hypertension;Lipids   Review Sherman's weight has been fairly steady during the program.  His blood pressure are now running in the 110-120s/60s at rest.  He does not check his blod sugar at home, but has not noticed any highs or lows.  He had labs drawn on Monday for an appt next week.  He is  not currently exercisng at home.   Expected Outcomes Eldridge will continue to come to exercise and education classes.  We will discuss home exercise plans soon.        ITP Comments:     ITP Comments    Row Name 02/16/16 0757 03/15/16 0801         ITP Comments 30 day review. Continue with ITP 30 day review. Continue with ITP unless changes noted by Medical Director at signature of review.         Comments:

## 2016-03-15 NOTE — Progress Notes (Signed)
Daily Session Note  Patient Details  Name: Paul Bass MRN: [id number] Date of Birth: 11/10/1945 Referring Provider:   Flowsheet Row Cardiac Rehab from 02/07/2016 in [facility] Cardiac and Pulmonary Rehab  Referring Provider  Caranasos, Thomas MD.      Encounter Date: 03/15/2016  Check In:     Session Check In - 03/15/16 0901      Check-In   Location [facility]-Cardiac & Pulmonary Rehab   Staff Present Jessica Hawkins, MA, ACSM RCEP, Exercise Physiologist; , BA, ACSM CEP, Exercise Physiologist;Carroll Enterkin, RN, BSN   Supervising physician immediately available to respond to emergencies See telemetry face sheet for immediately available ER MD   Medication changes reported     No   Fall or balance concerns reported    No   Warm-up and Cool-down Performed on first and last piece of equipment   Resistance Training Performed Yes   VAD Patient? No     Pain Assessment   Currently in Pain? No/denies   Multiple Pain Sites No         Goals Met:  Proper associated with RPD/PD & O2 Sat Independence with exercise equipment No report of cardiac concerns or symptoms Strength training completed today  Goals Unmet:  Not Applicable  Comments: Pt able to follow exercise prescription today without complaint.  Will continue to monitor for progression.    Dr. Mark Miller is Medical Director for [facility] and [facility]. 

## 2016-03-17 ENCOUNTER — Encounter: Payer: Medicare HMO | Admitting: *Deleted

## 2016-03-17 DIAGNOSIS — Z951 Presence of aortocoronary bypass graft: Secondary | ICD-10-CM

## 2016-03-17 NOTE — Progress Notes (Signed)
Daily Session Note  Patient Details  Name: Paul Bass MRN: [id number] Date of Birth: Jul 15, 1946 Referring Provider:   Flowsheet Row Cardiac Rehab from 02/07/2016 in [facility] Cardiac and Pulmonary Rehab  Referring Provider  Dwaine Deter MD.      Encounter Date: 03/17/2016  Check In:     Session Check In - 03/17/16 0848      Check-In   Location [facility]-Cardiac & Pulmonary Rehab   Staff Present Alberteen Sam, MA, ACSM RCEP, Exercise Physiologist;Amanda Oletta Darter, BA, ACSM CEP, Exercise Physiologist;Carroll Enterkin, RN, BSN   Supervising physician immediately available to respond to emergencies See telemetry face sheet for immediately available ER MD   Medication changes reported     No   Fall or balance concerns reported    No   Warm-up and Cool-down Performed on first and last piece of equipment   Resistance Training Performed Yes   VAD Patient? No     Pain Assessment   Currently in Pain? No/denies   Multiple Pain Sites No         Goals Met:  Independence with exercise equipment Exercise tolerated well No report of cardiac concerns or symptoms Strength training completed today  Goals Unmet:  Not Applicable  Comments: Pt able to follow exercise prescription today without complaint.  Will continue to monitor for progression.    Dr. Emily Filbert is Medical Director for [place] and [facility].

## 2016-03-20 ENCOUNTER — Encounter: Payer: Medicare HMO | Admitting: *Deleted

## 2016-03-20 DIAGNOSIS — Z951 Presence of aortocoronary bypass graft: Secondary | ICD-10-CM | POA: Diagnosis not present

## 2016-03-20 NOTE — Progress Notes (Signed)
Daily Session Note  Patient Details  Name: ELMER MERWIN MRN: [id number] Date of Birth: 01/18/1946 Referring Provider:   Flowsheet Row Cardiac Rehab from 02/07/2016 in [facility] Cardiac and Pulmonary Rehab  Referring Provider  Dwaine Deter MD.      Encounter Date: 03/20/2016  Check In:     Session Check In - 03/20/16 0821      Check-In   Location [facility]-Cardiac & Pulmonary Rehab   Staff Present Heath Lark, RN, BSN, Laveda Norman, BS, ACSM CEP, Exercise Physiologist;Jessica [place], [place], ACSM RCEP, Exercise Physiologist   Supervising physician immediately available to respond to emergencies See telemetry face sheet for immediately available ER MD   Medication changes reported     No   Fall or balance concerns reported    No   Warm-up and Cool-down Performed on first and last piece of equipment   Resistance Training Performed Yes   VAD Patient? No     Pain Assessment   Currently in Pain? No/denies   Multiple Pain Sites No         Goals Met:  Independence with exercise equipment Exercise tolerated well No report of cardiac concerns or symptoms Strength training completed today  Goals Unmet:  Not Applicable  Comments: Pt able to follow exercise prescription today without complaint.  Will continue to monitor for progression.    Dr. Emily Filbert is Medical Director for [place] and [facility].

## 2016-03-22 ENCOUNTER — Encounter: Payer: Medicare HMO | Admitting: *Deleted

## 2016-03-22 DIAGNOSIS — Z951 Presence of aortocoronary bypass graft: Secondary | ICD-10-CM

## 2016-03-22 NOTE — Progress Notes (Signed)
Daily Session Note  Patient Details  Name: Paul Bass MRN: [id number] Date of Birth: 22-May-1946 Referring Provider:   Flowsheet Row Cardiac Rehab from 02/07/2016 in [facility] Cardiac and Pulmonary Rehab  Referring Provider  Dwaine Deter MD.      Encounter Date: 03/22/2016  Check In:     Session Check In - 03/22/16 0951      Check-In   Location [facility]Cardiac & Pulmonary Rehab   Staff Present Nyoka Cowden, RN, BSN, Willette Pa, MA, ACSM RCEP, Exercise Physiologist;Amanda Oletta Darter, [place], ACSM CEP, Exercise Physiologist   Supervising physician immediately available to respond to emergencies See telemetry face sheet for immediately available ER MD   Medication changes reported     No   Fall or balance concerns reported    No   Warm-up and Cool-down Performed on first and last piece of equipment   Resistance Training Performed Yes   VAD Patient? No     Pain Assessment   Currently in Pain? No/denies   Multiple Pain Sites No         Goals Met:  Independence with exercise equipment Exercise tolerated well Personal goals reviewed No report of cardiac concerns or symptoms Strength training completed today  Goals Unmet:  Not Applicable  Comments: Pt able to follow exercise prescription today without complaint.  Will continue to monitor for progression.    Dr. Emily Filbert is Medical Director for [place] and [facility].

## 2016-03-24 ENCOUNTER — Encounter: Payer: Medicare HMO | Admitting: *Deleted

## 2016-03-24 DIAGNOSIS — Z951 Presence of aortocoronary bypass graft: Secondary | ICD-10-CM

## 2016-03-24 NOTE — Progress Notes (Signed)
Daily Session Note  Patient Details  Name: Paul Bass MRN: [id number] Date of Birth: 21-May-1946 Referring Provider:   Flowsheet Row Cardiac Rehab from 02/07/2016 in [facility] Cardiac and Pulmonary Rehab  Referring Provider  Dwaine Deter MD.      Encounter Date: 03/24/2016  Check In:     Session Check In - 03/24/16 0936      Check-In   Location [facility] & [facility] Rehab   Staff Present Nyoka Cowden, RN, BSN, MA;Susanne Bice, RN, BSN, CCRP;Jarome Trull Luan Pulling, MA, ACSM RCEP, Exercise Physiologist;Other   Supervising physician immediately available to respond to emergencies See telemetry face sheet for immediately available ER MD   Medication changes reported     No   Fall or balance concerns reported    No   Warm-up and Cool-down Performed on first and last piece of equipment   Resistance Training Performed Yes   VAD Patient? No     Pain Assessment   Currently in Pain? No/denies   Multiple Pain Sites No           Exercise Prescription Changes - 03/23/16 1400      Exercise Review   Progression Yes     Response to Exercise   Blood Pressure (Admit) 134/64   Blood Pressure (Exercise) 138/62   Blood Pressure (Exit) 116/64   Heart Rate (Admit) 61 bpm   Heart Rate (Exercise) 97 bpm   Heart Rate (Exit) 55 bpm   Rating of Perceived Exertion (Exercise) 12   Symptoms none   Comments Home Exercise Guidelines given 03/03/16   Duration Progress to 45 minutes of aerobic exercise without signs/symptoms of physical distress   Intensity THRR unchanged     Progression   Progression Continue to progress workloads to maintain intensity without signs/symptoms of physical distress.   Average METs 3.08     Resistance Training   Training Prescription Yes   Weight 4 lba   Reps 10-15     Interval Training   Interval Training No     Treadmill   MPH 2.3   Grade 1.5   Minutes 15   METs 3.08     Bike   Level 1.5   Minutes 15   METs 3.75     T5 Nustep   Level 3   Minutes 15   METs 2.7     Home Exercise Plan   Plans to continue exercise at Home  walking and treadmill   Frequency Add 3 additional days to program exercise sessions.      Goals Met:  Independence with exercise equipment Exercise tolerated well Personal goals reviewed No report of cardiac concerns or symptoms Strength training completed today  Goals Unmet:  Not Applicable  Comments: Pt able to follow exercise prescription today without complaint.  Will continue to monitor for progression.    Dr. Emily Filbert is Medical Director for [place] and [facility].

## 2016-03-27 ENCOUNTER — Encounter: Payer: Medicare HMO | Admitting: *Deleted

## 2016-03-27 DIAGNOSIS — Z951 Presence of aortocoronary bypass graft: Secondary | ICD-10-CM | POA: Diagnosis not present

## 2016-03-27 NOTE — Progress Notes (Signed)
Daily Session Note  Patient Details  Name: Paul Bass MRN: [id number] Date of Birth: [birth year] Referring Provider:   Flowsheet Row Cardiac Rehab from 02/07/2016 in [facility] Card[facility] Rehab  Referring Provider  Dwaine Deter MD.      Encounter Date: 03/27/2016  Check In:     Session Check In - 03/27/16 0823      Check-In   Location [facility] Re[facility]   Staff Present Heath Lark, RN, BSN, Laveda Norman, BS, ACSM CEP, Exercise Physiologist;Jessica [place], [place], ACSM RCEP, Exercise Physiologist   Supervising physician immediately available to respond to emergencies See telemetry face sheet for immediately available ER MD   Medication changes reported     No   Fall or balance concerns reported    No   Warm-up and Cool-down Performed on first and last piece of equipment   Resistance Training Performed Yes   VAD Patient? No     Pain Assessment   Currently in Pain? No/denies   Multiple Pain Sites No         Goals Met:  Independence with exercise equipment Exercise tolerated well No report of cardiac concerns or symptoms Strength training completed today  Goals Unmet:  Not Applicable  Comments: Pt able to follow exercise prescription today without complaint.  Will continue to monitor for progression.    Dr. Emily Filbert is Medical Director for [place] and [facility].

## 2016-03-29 VITALS — Ht 71.5 in | Wt 233.0 lb

## 2016-03-29 DIAGNOSIS — Z951 Presence of aortocoronary bypass graft: Secondary | ICD-10-CM

## 2016-03-29 NOTE — Progress Notes (Signed)
Daily Session Note  Patient Details  Name: Paul Bass MRN: [id number] Date of Birth: October 01, 1945 Referring Provider:   Flowsheet Row Cardiac Rehab from 02/07/2016 in [facility] Cardiac and Pulmonary Rehab  Referring Provider  Dwaine Deter MD.      Encounter Date: 03/29/2016  Check In:     Session Check In - 03/29/16 0809      Check-In   Location [facility] Pulmonary Re[facility]   Staff Present Nyoka Cowden, RN, BSN, Willette Pa, MA, ACSM RCEP, Exercise Physiologist;Davari Lopes Oletta Darter, [place], ACSM CEP, Exercise Physiologist   Supervising physician immediately available to respond to emergencies See telemetry face sheet for immediately available ER MD   Medication changes reported     No   Fall or balance concerns reported    No   Warm-up and Cool-down Performed on first and last piece of equipment   Resistance Training Performed Yes   VAD Patient? No     Pain Assessment   Currently in Pain? No/denies   Multiple Pain Sites No         Goals Met:  Independence with exercise equipment Personal goals reviewed No report of cardiac concerns or symptoms Strength training completed today  Goals Unmet:  Not Applicable  Comments:      [place] Name 02/07/16 1429 03/29/16 0946       6 Minute Walk   Phase Initial Discharge    Distance 1086 feet 1305 feet    Distance % Change  - 20 %    Walk Time 6 minutes 6 minutes    # of Rest Breaks 0 0    MPH 2 2.47    METS 2.58 3.05    RPE 13 13    VO2 Peak  - 10.7    Symptoms No No    Resting HR 58 bpm 81 bpm    Resting BP 114/66 122/70    Max Ex. HR 70 bpm 107 bpm    Max Ex. BP 140/70 170/66        Dr. Emily Filbert is Medical Director for [place] and [facility].

## 2016-03-31 ENCOUNTER — Encounter: Payer: Medicare HMO | Attending: Cardiothoracic Surgery | Admitting: *Deleted

## 2016-03-31 DIAGNOSIS — Z951 Presence of aortocoronary bypass graft: Secondary | ICD-10-CM | POA: Diagnosis present

## 2016-03-31 NOTE — Progress Notes (Signed)
Daily Session Note  Patient Details  Name: Paul Bass MRN: [id number] Date of Birth: 08/04/1945 Referring Provider:   Flowsheet Row Cardiac Rehab from 02/07/2016 in [facility] Cardiac and Pulmonary Rehab  Referring Provider  Dwaine Deter MD.      Encounter Date: 03/31/2016  Check In:     Session Check In - 03/31/16 0814      Check-In   Location [facility] Re[facility]   Staff Present Alberteen Sam, MA, ACSM RCEP, Exercise Physiologist;Amanda Oletta Darter, BA, ACSM CEP, Exercise Physiologist;Carroll Enterkin, RN, BSN   Supervising physician immediately available to respond to emergencies See telemetry face sheet for immediately available ER MD   Medication changes reported     No   Fall or balance concerns reported    No   Warm-up and Cool-down Performed on first and last piece of equipment   Resistance Training Performed Yes   VAD Patient? No     Pain Assessment   Currently in Pain? No/denies   Multiple Pain Sites No         Goals Met:  Independence with exercise equipment Exercise tolerated well No report of cardiac concerns or symptoms Strength training completed today  Goals Unmet:  Not Applicable  Comments: Pt able to follow exercise prescription today without complaint.  Will continue to monitor for progression.    Dr. Emily Filbert is Medical Director for [place] and [facility].

## 2016-04-05 ENCOUNTER — Encounter: Payer: Medicare HMO | Admitting: *Deleted

## 2016-04-05 DIAGNOSIS — Z951 Presence of aortocoronary bypass graft: Secondary | ICD-10-CM | POA: Diagnosis not present

## 2016-04-05 NOTE — Progress Notes (Signed)
Daily Session Note  Patient Details  Name: Paul Bass MRN: [id number] Date of Birth: 02-Mar-1946 Referring Provider:   Flowsheet Row Cardiac Rehab from 02/07/2016 in [facility] Cardiac and Pulmonary Rehab  Referring Provider  Dwaine Deter MD.      Encounter Date: 04/05/2016  Check In:     Session Check In - 04/05/16 0910      Check-In   Location [facility] Re[facility]   Staff Present Alberteen Sam, MA, ACSM RCEP, Exercise Physiologist;Susanne Bice, RN, BSN, Lance Sell, BA, ACSM CEP, Exercise Physiologist   Supervising physician immediately available to respond to emergencies See telemetry face sheet for immediately available ER MD   Medication changes reported     No   Fall or balance concerns reported    No   Warm-up and Cool-down Performed on first and last piece of equipment   Resistance Training Performed Yes   VAD Patient? No     Pain Assessment   Currently in Pain? No/denies   Multiple Pain Sites No         Goals Met:  Independence with exercise equipment Exercise tolerated well No report of cardiac concerns or symptoms Strength training completed today  Goals Unmet:  Not Applicable  Comments: Pt able to follow exercise prescription today without complaint.  Will continue to monitor for progression. \   Dr. Emily Filbert is Medical Director for [place] and [facility].

## 2016-04-07 ENCOUNTER — Encounter: Payer: Medicare HMO | Admitting: *Deleted

## 2016-04-07 DIAGNOSIS — Z951 Presence of aortocoronary bypass graft: Secondary | ICD-10-CM | POA: Diagnosis not present

## 2016-04-07 NOTE — Progress Notes (Signed)
Daily Session Note  Patient Details  Name: Paul Bass MRN: [id number] Date of Birth: 09/11/1945 Referring Provider:   Flowsheet Row Cardiac Rehab from 02/07/2016 in [facility] Cardiac and Pulmonary Rehab  Referring Provider  Dwaine Deter MD.      Encounter Date: 04/07/2016  Check In:     Session Check In - 04/07/16 0852      Check-In   Location [facility]-Cardiac & Pulmonary Rehab   Staff Present Gerlene Burdock, RN, Levie Heritage, MA, ACSM RCEP, Exercise Physiologist   Supervising physician immediately available to respond to emergencies See telemetry face sheet for immediately available ER MD   Medication changes reported     No   Fall or balance concerns reported    No   Warm-up and Cool-down Performed on first and last piece of equipment   Resistance Training Performed Yes   VAD Patient? No     Pain Assessment   Currently in Pain? No/denies         Goals Met:  Proper associated with RPD/PD & O2 Sat No report of cardiac concerns or symptoms  Goals Unmet:  Not Applicable  Comments:     Dr. Emily Filbert is Medical Director for [place] and [facility].

## 2016-04-07 NOTE — Patient Instructions (Signed)
Discharge Instructions  Patient Details  Name: Paul Bass MRN: [id number] Date of Birth: 02/10/1946 Referring Provider:  Gustavo Lah, MD   Number of Visits: 20  Reason for Discharge:  Patient reached a stable level of exercise. Patient independent in their exercise.  Smoking History:  History  Smoking Status  . Former Smoker  . Packs/day: 2.00  . Types: Cigarettes  . Quit date: 05/20/2012  Smokeless Tobacco  . Never Used    Diagnosis:  S/P CABG x 3  Initial Exercise Prescription:     Initial Exercise Prescription - 02/07/16 1400      Date of Initial Exercise RX and Referring Provider   Date 02/07/16   Referring Provider Dwaine Deter MD.     Treadmill   MPH 2   Grade 0   Minutes 15   METs 2.5     Bike   Level 2   Minutes 15   METs 3     NuStep   Level 3   Minutes 15   METs 3.5     REL-XR   Level 3   Minutes 15   METs 3     Prescription Details   Frequency (times per week) 3   Duration Progress to 30 minutes of continuous aerobic without signs/symptoms of physical distress     Intensity   THRR 40-80% of Max Heartrate 109-139   Ratings of Perceived Exertion 11-13   Perceived Dyspnea 0-4     Progression   Progression Continue progressive overload as per policy without signs/symptoms or physical distress.     Resistance Training   Training Prescription Yes   Weight 2   Reps 10-12      Discharge Exercise Prescription (Final Exercise Prescription Changes):     Exercise Prescription Changes - 04/05/16 1600      Exercise Review   Progression Yes     Response to Exercise   Blood Pressure (Admit) 108/64   Blood Pressure (Exercise) 132/64   Blood Pressure (Exit) 116/68   Heart Rate (Admit) 56 bpm   Heart Rate (Exercise) 83 bpm   Heart Rate (Exit) 51 bpm   Rating of Perceived Exertion (Exercise) 12   Symptoms none   Comments Home Exercise Guidelines given 03/03/16   Duration Progress to 45 minutes of aerobic exercise  without signs/symptoms of physical distress   Intensity THRR unchanged     Progression   Progression Continue to progress workloads to maintain intensity without signs/symptoms of physical distress.   Average METs 2.72     Resistance Training   Training Prescription Yes   Weight 4 lbs   Reps 10-15     Interval Training   Interval Training No     Treadmill   MPH 2.5   Grade 1.5   Minutes 15   METs 3.26     Recumbant Elliptical   Level 2   RPM 50   Minutes 15   METs 2.1     T5 Nustep   Level 3   Minutes 15   METs 2.8     Home Exercise Plan   Plans to continue exercise at Home  walking and treadmill   Frequency Add 3 additional days to program exercise sessions.      Functional Capacity:     6 Minute Walk    Row Name 02/07/16 1429 03/29/16 0946       6 Minute Walk   Phase Initial Discharge    Distance 1086 feet 1305 feet  Distance % Change  - 20 %    Walk Time 6 minutes 6 minutes    # of Rest Breaks 0 0    MPH 2 2.47    METS 2.58 3.05    RPE 13 13    VO2 Peak  - 10.7    Symptoms No No    Resting HR 58 bpm 81 bpm    Resting BP 114/66 122/70    Max Ex. HR 70 bpm 107 bpm    Max Ex. BP 140/70 170/66       Quality of Life:     Quality of Life - 04/05/16 0954      Quality of Life Scores   Health/Function Pre 19.03 %   Health/Function Post 17.17 %   Health/Function % Change -9.77 %   Socioeconomic Pre 23.29 %   Socioeconomic Post 20.33 %   Socioeconomic % Change  -12.71 %   Psych/Spiritual Pre 21.14 %   Psych/Spiritual Post 22.43 %   Psych/Spiritual % Change 6.1 %   Family Pre 28.8 %   Family Post 26.1 %   Family % Change -9.38 %   GLOBAL Pre 21.78 %   GLOBAL Post 20.21 %   GLOBAL % Change -7.21 %      Personal Goals: Goals established at orientation with interventions provided to work toward goal.     Personal Goals and Risk Factors at Admission - 02/07/16 1515      Core Components/Risk Factors/Patient Goals on Admission     Weight Management Weight Maintenance   Intervention Weight Management: Provide education and appropriate resources to help participant work on and attain dietary goals.   Admit Weight 227 lb (103 kg)   Goal Weight: Short Term 224 lb (101.6 kg)   Goal Weight: Long Term 224 lb (101.6 kg)   Expected Outcomes Short Term: Continue to assess and modify interventions until short term weight is achieved;Weight Maintenance: Understanding of the daily nutrition guidelines, which includes 25-35% calories from fat, 7% or less cal from saturated fats, less than 200mg  cholesterol, less than 1.5gm of sodium, & 5 or more servings of fruits and vegetables daily;Long Term: Adherence to nutrition and physical activity/exercise program aimed toward attainment of established weight goal   Sedentary Yes   Intervention Provide advice, education, support and counseling about physical activity/exercise needs.;Develop an individualized exercise prescription for aerobic and resistive training based on initial evaluation findings, risk stratification, comorbidities and participant's personal goals.   Expected Outcomes Achievement of increased cardiorespiratory fitness and enhanced flexibility, muscular endurance and strength shown through measurements of functional capacity and personal statement of participant.   Increase Strength and Stamina Yes   Intervention Provide advice, education, support and counseling about physical activity/exercise needs.;Develop an individualized exercise prescription for aerobic and resistive training based on initial evaluation findings, risk stratification, comorbidities and participant's personal goals.   Expected Outcomes Achievement of increased cardiorespiratory fitness and enhanced flexibility, muscular endurance and strength shown through measurements of functional capacity and personal statement of participant.   Diabetes Yes   Intervention Provide education about signs/symptoms and action  to take for hypo/hyperglycemia.;Provide education about proper nutrition, including hydration, and aerobic/resistive exercise prescription along with prescribed medications to achieve blood glucose in normal ranges: Fasting glucose 65-99 mg/dL   Expected Outcomes Short Term: Participant verbalizes understanding of the signs/symptoms and immediate care of hyper/hypoglycemia, proper foot care and importance of medication, aerobic/resistive exercise and nutrition plan for blood glucose control.;Long Term: Attainment of HbA1C < 7%.  Hypertension Yes   Intervention Provide education on lifestyle modifcations including regular physical activity/exercise, weight management, moderate sodium restriction and increased consumption of fresh fruit, vegetables, and low fat dairy, alcohol moderation, and smoking cessation.;Monitor prescription use compliance.   Expected Outcomes Short Term: Continued assessment and intervention until BP is < 140/49mm HG in hypertensive participants. < 130/8mm HG in hypertensive participants with diabetes, heart failure or chronic kidney disease.;Long Term: Maintenance of blood pressure at goal levels.   Lipids Yes   Intervention Provide education and support for participant on nutrition & aerobic/resistive exercise along with prescribed medications to achieve LDL 70mg , HDL >40mg .   Expected Outcomes Short Term: Participant states understanding of desired cholesterol values and is compliant with medications prescribed. Participant is following exercise prescription and nutrition guidelines.;Long Term: Cholesterol controlled with medications as prescribed, with individualized exercise RX and with personalized nutrition plan. Value goals: LDL < 70mg , HDL > 40 mg.   Stress Yes   Intervention Offer individual and/or small group education and counseling on adjustment to heart disease, stress management and health-related lifestyle change. Teach and support self-help strategies.;Refer  participants experiencing significant psychosocial distress to appropriate mental health specialists for further evaluation and treatment. When possible, include family members and significant others in education/counseling sessions.   Expected Outcomes Short Term: Participant demonstrates changes in health-related behavior, relaxation and other stress management skills, ability to obtain effective social support, and compliance with psychotropic medications if prescribed.;Long Term: Emotional wellbeing is indicated by absence of clinically significant psychosocial distress or social isolation.       Personal Goals Discharge:     Goals and Risk Factor Review - 03/22/16 0951      Core Components/Risk Factors/Patient Goals Review   Personal Goals Review Weight Management/Obesity;Sedentary;Increase Strength and Stamina;Diabetes;Hypertension;Lipids   Review Paul Bass has been attending classes regularly and walking at home.  He says that his blood pressures and blood sugars have been good, although he admits to not checking them at home.  He has not had any issues with taking his statin.  However, he does note tat when he stands up quickly, he continues to have dizzy spells.  We talked about orthostatic pressures and the importance of staying hydrated and getting up slowly.   Expected Outcomes We will continue to monitor Paul Bass in the program, keeping a close eye on his blood pressure.  He will continue to attend education and exercise classes.      Nutrition & Weight - Outcomes:     Pre Biometrics - 02/07/16 1439      Pre Biometrics   Height 5' 11.5" (1.816 m)   Weight 227 lb (103 kg)   Waist Circumference 44.5 inches   Hip Circumference 41.5 inches   Waist to Hip Ratio 1.07 %   BMI (Calculated) 31.3   Single Leg Stand 5 seconds         Post Biometrics - 03/29/16 0944       Post  Biometrics   Height 5' 11.5" (1.816 m)   Weight 233 lb (105.7 kg)   Waist Circumference 46 inches   Hip  Circumference 41.5 inches   Waist to Hip Ratio 1.11 %   BMI (Calculated) 32.1   Single Leg Stand 5 seconds      Nutrition:     Nutrition Therapy & Goals - 02/23/16 1118      Personal Nutrition Goals   Additional Goals? No   Comments Pt declined individual nutrition appt.      Nutrition Discharge:  Nutrition Assessments - 04/05/16 0954      Rate Your Plate Scores   Pre Score 56   Pre Score % 62.2 %   Post Score 50   Post Score % 56 %   % Change -6.2 %      Education Questionnaire Score:     Knowledge Questionnaire Score - 04/05/16 0953      Knowledge Questionnaire Score   Pre Score 22/28   Post Score 25/28      Goals reviewed with patient; copy given to patient.

## 2016-04-10 ENCOUNTER — Encounter: Payer: Medicare HMO | Admitting: *Deleted

## 2016-04-10 DIAGNOSIS — Z951 Presence of aortocoronary bypass graft: Secondary | ICD-10-CM | POA: Diagnosis not present

## 2016-04-10 NOTE — Progress Notes (Signed)
Daily Session Note  Patient Details  Name: Paul Bass MRN: [id number] Date of Birth: June 23, 1946 Referring Provider:   Flowsheet Row Cardiac Rehab from 02/07/2016 in [facility] Cardiac [facility] Rehab  Referring Provider  Dwaine Deter MD.      Encounter Date: 04/10/2016  Check In:     Session Check In - 04/10/16 0802      Check-In   Location [facility]-Cardiac & Pulmonary Rehab   Staff Present Gerlene Burdock, RN, Moises Blood, BS, ACSM CEP, Exercise Physiologist;Jessica Luan Pulling, MA, ACSM RCEP, Exercise Physiologist   Supervising physician immediately available to respond to emergencies See telemetry face sheet for immediately available ER MD   Medication changes reported     No   Fall or balance concerns reported    No   Warm-up and Cool-down Performed on first and last piece of equipment   Resistance Training Performed Yes   VAD Patient? No     Pain Assessment   Currently in Pain? No/denies   Multiple Pain Sites No         Goals Met:  Independence with exercise equipment Exercise tolerated well No report of cardiac concerns or symptoms Strength training completed today  Goals Unmet:  Not Applicable  Comments: Pt able to follow exercise prescription today without complaint.  Will continue to monitor for progression.    Dr. Emily Filbert is Medical Director for [place] and [facility].

## 2016-04-12 ENCOUNTER — Encounter: Payer: Medicare HMO | Admitting: *Deleted

## 2016-04-12 ENCOUNTER — Encounter: Payer: Self-pay | Admitting: *Deleted

## 2016-04-12 DIAGNOSIS — Z951 Presence of aortocoronary bypass graft: Secondary | ICD-10-CM

## 2016-04-12 NOTE — Progress Notes (Signed)
Daily Session Note  Patient Details  Name: Paul Bass MRN: [id number] Date of Birth: 10/20/1945 Referring Provider:   Flowsheet Row Cardiac Rehab from 02/07/2016 in [facility] Cardiac and Pulmonary Rehab  Referring Provider  Dwaine Deter MD.      Encounter Date: 04/12/2016  Check In:     Session Check In - 04/12/16 0753      Check-In   Location [facility]-Cardiac & Pulmonary Rehab   Staff Present Heath Lark, RN, BSN, CCRP;Taiden Raybourn Luan Pulling, MA, ACSM RCEP, Exercise Physiologist   Supervising physician immediately available to respond to emergencies See telemetry face sheet for immediately available ER MD   Medication changes reported     No   Fall or balance concerns reported    No   Warm-up and Cool-down Performed on first and last piece of equipment   Resistance Training Performed Yes   VAD Patient? No     Pain Assessment   Currently in Pain? No/denies   Multiple Pain Sites No         Goals Met:  Independence with exercise equipment Exercise tolerated well No report of cardiac concerns or symptoms Strength training completed today  Goals Unmet:  Not Applicable  Comments: Pt able to follow exercise prescription today without complaint.  Will continue to monitor for progression.  Paul Bass graduated today from cardiac rehab with 36 sessions completed.  Details of the patient's exercise prescription and what He needs to do in order to continue the prescription and progress were discussed with patient.  Patient was given a copy of prescription and goals.  Patient verbalized understanding.  Paul Bass plans to continue to exercise by walking and using treadmill at home.    Dr. Emily Filbert is Medical Director for [place] and [facility].

## 2016-04-12 NOTE — Progress Notes (Signed)
Cardiac Individual Treatment Plan  Patient Details  Name: Paul Bass MRN: [id number] Date of Birth: 1946-07-07 Referring Provider:   Flowsheet Row Cardiac Rehab from 02/07/2016 in [facility] Cardiac and Pulmonary Rehab  Referring Provider  Dwaine Deter MD.      Initial Encounter Date:  Flowsheet Row Cardiac Rehab from 02/07/2016 in [facility] Cardiac and Pulmonary Rehab  Date  02/07/16  Referring Provider  Dwaine Deter MD.      Visit Diagnosis: S/P CABG x 3  Patient's Home Medications on Admission:  Current Outpatient Prescriptions:  .  albuterol (PROVENTIL HFA;VENTOLIN HFA) 108 (90 BASE) MCG/ACT inhaler, Inhale 2 puffs into the lungs every 6 (six) hours as needed for wheezing or shortness of breath. For shortness of breath, Disp: , Rfl:  .  amiodarone (PACERONE) 200 MG tablet, TAKE 2 TABLETS (400 MG TOTAL) BY MOUTH DAILY., Disp: , Rfl: 11 .  aspirin EC 81 MG tablet, Take 81 mg by mouth daily., Disp: , Rfl:  .  atorvastatin (LIPITOR) 80 MG tablet, Take 80 mg by mouth., Disp: , Rfl:  .  budesonide-formoterol (SYMBICORT) 160-4.5 MCG/ACT inhaler, Inhale 2 puffs into the lungs 2 (two) times daily., Disp: , Rfl:  .  cholecalciferol (VITAMIN D) 400 UNITS TABS, Take 400 Units by mouth 2 (two) times daily., Disp: , Rfl:  .  citalopram (CELEXA) 20 MG tablet, Take 20 mg by mouth daily., Disp: , Rfl:  .  clonazePAM (KLONOPIN) 0.5 MG tablet, Take 0.5 mg by mouth 2 (two) times daily as needed for anxiety., Disp: , Rfl:  .  clopidogrel (PLAVIX) 75 MG tablet, TAKE 1 TABLET (75 MG TOTAL) BY MOUTH ONCE DAILY., Disp: , Rfl: 1 .  Coenzyme Q10 (CO Q-10) 200 MG CAPS, Take 1 capsule by mouth daily., Disp: , Rfl:  .  glucose blood test strip, , Disp: , Rfl:  .  levothyroxine (SYNTHROID, LEVOTHROID) 100 MCG tablet, Take 100 mcg by mouth daily before breakfast., Disp: , Rfl:  .  losartan (COZAAR) 25 MG tablet, Take by mouth., Disp: , Rfl:  .  metFORMIN (GLUCOPHAGE) 500 MG tablet, TAKE 1 TABLET BY MOUTH  ONCE A DAY, Disp: , Rfl:  .  Multiple Vitamin (MULTIVITAMIN WITH MINERALS) TABS, Take 0.5 tablets by mouth 2 (two) times daily., Disp: , Rfl:  .  Omega-3 Fatty Acids (FISH OIL) 1200 MG CAPS, Take 1,000 mg by mouth 2 (two) times daily., Disp: , Rfl:  .  omeprazole (PRILOSEC) 20 MG capsule, Take 20 mg by mouth daily., Disp: , Rfl:  .  PSYLLIUM PO, Take by mouth., Disp: , Rfl:  .  tamsulosin (FLOMAX) 0.4 MG CAPS capsule, Take 0.4 mg by mouth daily. , Disp: , Rfl:   Past Medical History: Past Medical History:  Diagnosis Date  . Anxiety   . BPH (benign prostatic hypertrophy)   . CAD S/P percutaneous coronary angioplasty   . COPD (chronic obstructive pulmonary disease) ([place])   . Depression   . HTN (hypertension)   . Hyperlipidemia   . Hypothyroidism   . Stroke ([place])   . Tobacco abuse     Tobacco Use: History  Smoking Status  . Former Smoker  . Packs/day: 2.00  . Types: Cigarettes  . Quit date: 05/20/2012  Smokeless Tobacco  . Never Used    Labs: Recent Review Flowsheet Data    Labs for ITP Cardiac and Pulmonary Rehab Latest Ref Rng & Units 05/12/2012 05/12/2012 05/13/2012   Cholestrol 0 - 200 mg/dL - - 119  LDLCALC 0 - 99 mg/dL - - 64   HDL >39 mg/dL - - 40   Trlycerides <150 mg/dL - - 76   Hemoglobin A1c <5.7 % - - 6.3(H)   PHART 7.350 - 7.450 7.243(L) 7.342(L) -   PCO2ART 35.0 - 45.0 mmHg 55.0(H) 48.4(H) -   HCO3 20.0 - 24.0 mEq/L 22.9 25.5(H) -   TCO2 0 - 100 mmol/L 24.6 27.0 -   ACIDBASEDEF 0.0 - 2.0 mmol/L 3.4(H) - -   O2SAT % 99.2 98.0 -       Exercise Target Goals:    Exercise Program Goal: Individual exercise prescription set with THRR, safety & activity barriers. Participant demonstrates ability to understand and report RPE using BORG scale, to self-measure pulse accurately, and to acknowledge the importance of the exercise prescription.  Exercise Prescription Goal: Starting with aerobic activity 30 plus minutes a day, 3 days per week for initial exercise  prescription. Provide home exercise prescription and guidelines that participant acknowledges understanding prior to discharge.  Activity Barriers & Risk Stratification:     Activity Barriers & Cardiac Risk Stratification - 02/07/16 1502      Activity Barriers & Cardiac Risk Stratification   Activity Barriers Back Problems;Muscular Weakness;Shortness of Breath   Cardiac Risk Stratification High      6 Minute Walk:     6 Minute Walk    Row Name 02/07/16 1429 03/29/16 0946       6 Minute Walk   Phase Initial Discharge    Distance 1086 feet 1305 feet    Distance % Change  - 20 %    Walk Time 6 minutes 6 minutes    # of Rest Breaks 0 0    MPH 2 2.47    METS 2.58 3.05    RPE 13 13    VO2 Peak  - 10.7    Symptoms No No    Resting HR 58 bpm 81 bpm    Resting BP 114/66 122/70    Max Ex. HR 70 bpm 107 bpm    Max Ex. BP 140/70 170/66       Initial Exercise Prescription:     Initial Exercise Prescription - 02/07/16 1400      Date of Initial Exercise RX and Referring Provider   Date 02/07/16   Referring Provider Dwaine Deter MD.     Treadmill   MPH 2   Grade 0   Minutes 15   METs 2.5     Bike   Level 2   Minutes 15   METs 3     NuStep   Level 3   Minutes 15   METs 3.5     REL-XR   Level 3   Minutes 15   METs 3     Prescription Details   Frequency (times per week) 3   Duration Progress to 30 minutes of continuous aerobic without signs/symptoms of physical distress     Intensity   THRR 40-80% of Max Heartrate 109-139   Ratings of Perceived Exertion 11-13   Perceived Dyspnea 0-4     Progression   Progression Continue progressive overload as per policy without signs/symptoms or physical distress.     Resistance Training   Training Prescription Yes   Weight 2   Reps 10-12      Perform Capillary Blood Glucose checks as needed.  Exercise Prescription Changes:     Exercise Prescription Changes    Row Name 02/23/16 1100 03/03/16 1000  03/09/16 1100 03/23/16  1400 04/05/16 1600     Exercise Review   Progression Yes Yes Yes Yes Yes     Response to Exercise   Blood Pressure (Admit) 120/64  - 116/53 134/64 108/64   Blood Pressure (Exercise) 136/64  - 140/58 138/62 132/64   Blood Pressure (Exit) 120/64  - 112/62 116/64 116/68   Heart Rate (Admit) 70 bpm  - 59 bpm 61 bpm 56 bpm   Heart Rate (Exercise) 112 bpm  - 110 bpm 97 bpm 83 bpm   Heart Rate (Exit) 60 bpm  - 56 bpm 55 bpm 51 bpm   Rating of Perceived Exertion (Exercise) 12  - '12 12 12   ' Symptoms none none none none none   Comments  - Home Exercise Guidelines given 03/03/16 Home Exercise Guidelines given 03/03/16 Home Exercise Guidelines given 03/03/16 Home Exercise Guidelines given 03/03/16   Duration Progress to 45 minutes of aerobic exercise without signs/symptoms of physical distress Progress to 45 minutes of aerobic exercise without signs/symptoms of physical distress Progress to 45 minutes of aerobic exercise without signs/symptoms of physical distress Progress to 45 minutes of aerobic exercise without signs/symptoms of physical distress Progress to 45 minutes of aerobic exercise without signs/symptoms of physical distress   Intensity THRR unchanged THRR unchanged THRR unchanged THRR unchanged THRR unchanged     Progression   Progression Continue to progress workloads to maintain intensity without signs/symptoms of physical distress. Continue to progress workloads to maintain intensity without signs/symptoms of physical distress. Continue to progress workloads to maintain intensity without signs/symptoms of physical distress. Continue to progress workloads to maintain intensity without signs/symptoms of physical distress. Continue to progress workloads to maintain intensity without signs/symptoms of physical distress.   Average METs 2.99 2.99 3.12 3.08 2.72     Resistance Training   Training Prescription Yes Yes Yes Yes Yes   Weight 3 lbs 3 lbs 3 lbs 4 lba 4 lbs   Reps 10-12  10-12 10-12 10-15 10-15     Interval Training   Interval Training No No No No No     Treadmill   MPH '2 2 2 ' 2.3 2.5   Grade '1 1 1 ' 1.5 1.5   Minutes '15 15 15 15 15   ' METs 2.81 2.81 2.81 3.08 3.26     Bike   Level 1.5 1.5 1.5 1.5  -   Minutes '15 15 15 15  ' -   METs 3.75 3.75 3.75 3.75  -     NuStep   Level 3 3  -  -  -   Minutes 15 15  -  -  -   METs 2.4 2.4  -  -  -     Recumbant Elliptical   Level  -  -  -  - 2   RPM  -  -  -  - 50   Minutes  -  -  -  - 15   METs  -  -  -  - 2.1     T5 Nustep   Level  -  - '3 3 3   ' Minutes  -  - '15 15 15   ' METs  -  - 2.8 2.7 2.8     Home Exercise Plan   Plans to continue exercise at  - Home  walking and treadmill Home  walking and treadmill Home  walking and treadmill Home  walking and treadmill   Frequency  - Add 3 additional days to program exercise sessions. Add  3 additional days to program exercise sessions. Add 3 additional days to program exercise sessions. Add 3 additional days to program exercise sessions.      Exercise Comments:     Exercise Comments    Row Name 02/23/16 1120 02/25/16 1024 03/03/16 1033 03/09/16 1113 03/10/16 0957   Exercise Comments Paul Bass is off to a good start with exercise.  He is now able to do all 30 or 45 min continuos.  Today we added more incline to treadmill.  We will continue to monitor for progression. Reviewed METs average and discussed progression with pt today. Reviewed home exercise with pt today.  Pt plans to walk and use treadmill at home for exercise.  Reviewed THR, pulse, RPE, sign and symptoms, and when to call 911 or MD.  Also discussed weather considerations and indoor options.  Pt voiced understanding. Paul Bass is doing well with exercise.  He has noticed a difference for himself.  He is starting to want to push himself more.  We will continue to monitor. Reviewed METs average and discussed progression with pt today.   Row Name 03/23/16 1452 03/24/16 0936 04/05/16 1612 04/10/16 0843      Exercise Comments Paul Bass has been doing well with exercise.  He has started to increase his treadmill.  We will continue to monitor for progression. Reviewed METs average and discussed progression with pt today. Paul Bass has been doing well with exercise.  He is walking all the way around the block on his off days.  He does use his treadmill when the weather is bad.  We will continue to monitor his progression. Reviewed METs average and discussed progression with pt today.       Discharge Exercise Prescription (Final Exercise Prescription Changes):     Exercise Prescription Changes - 04/05/16 1600      Exercise Review   Progression Yes     Response to Exercise   Blood Pressure (Admit) 108/64   Blood Pressure (Exercise) 132/64   Blood Pressure (Exit) 116/68   Heart Rate (Admit) 56 bpm   Heart Rate (Exercise) 83 bpm   Heart Rate (Exit) 51 bpm   Rating of Perceived Exertion (Exercise) 12   Symptoms none   Comments Home Exercise Guidelines given 03/03/16   Duration Progress to 45 minutes of aerobic exercise without signs/symptoms of physical distress   Intensity THRR unchanged     Progression   Progression Continue to progress workloads to maintain intensity without signs/symptoms of physical distress.   Average METs 2.72     Resistance Training   Training Prescription Yes   Weight 4 lbs   Reps 10-15     Interval Training   Interval Training No     Treadmill   MPH 2.5   Grade 1.5   Minutes 15   METs 3.26     Recumbant Elliptical   Level 2   RPM 50   Minutes 15   METs 2.1     T5 Nustep   Level 3   Minutes 15   METs 2.8     Home Exercise Plan   Plans to continue exercise at Home  walking and treadmill   Frequency Add 3 additional days to program exercise sessions.      Nutrition:  Target Goals: Understanding of nutrition guidelines, daily intake of sodium <1550m, cholesterol <2039m calories 30% from fat and 7% or less from saturated fats, daily to have 5 or more  servings of fruits and vegetables.  Biometrics:  Pre Biometrics - 02/07/16 1439      Pre Biometrics   Height 5' 11.5" (1.816 m)   Weight 227 lb (103 kg)   Waist Circumference 44.5 inches   Hip Circumference 41.5 inches   Waist to Hip Ratio 1.07 %   BMI (Calculated) 31.3   Single Leg Stand 5 seconds         Post Biometrics - 03/29/16 0944       Post  Biometrics   Height 5' 11.5" (1.816 m)   Weight 233 lb (105.7 kg)   Waist Circumference 46 inches   Hip Circumference 41.5 inches   Waist to Hip Ratio 1.11 %   BMI (Calculated) 32.1   Single Leg Stand 5 seconds      Nutrition Therapy Plan and Nutrition Goals:     Nutrition Therapy & Goals - 02/23/16 1118      Personal Nutrition Goals   Additional Goals? No   Comments Pt declined individual nutrition appt.      Nutrition Discharge: Rate Your Plate Scores:     Nutrition Assessments - 04/05/16 0954      Rate Your Plate Scores   Pre Score 56   Pre Score % 62.2 %   Post Score 50   Post Score % 56 %   % Change -6.2 %      Nutrition Goals Re-Evaluation:   Psychosocial: Target Goals: Acknowledge presence or absence of depression, maximize coping skills, provide positive support system. Participant is able to verbalize types and ability to use techniques and skills needed for reducing stress and depression.  Initial Review & Psychosocial Screening:     Initial Psych Review & Screening - 02/07/16 1518      Initial Review   Current issues with Current Depression;History of Depression;Current Psychotropic Meds;Current Sleep Concerns;Current Stress Concerns   Source of Stress Concerns Unable to perform yard/household activities;Chronic Illness   Comments Paul Bass had a stroke in 2013 which affected his speech; ability to write; and comprehension.  States he can read, but may take him longer to understand what he is reading.       Family Dynamics   Good Support System? Yes  Parks states his wife Paul Bass is very  supportive.  He has one child and one grandchild, which he states they are not close.  No church affiliation.       Barriers   Psychosocial barriers to participate in program Psychosocial barriers identified (see note)  See above comments.  Tadan is currently on Celexa 41m once a day.  DSylvesterhas a history of depression and has been to counseling in the past.  He states he does not feel he needs counseling now.       Screening Interventions   Interventions Encouraged to exercise;Program counselor consult      Quality of Life Scores:     Quality of Life - 04/05/16 0954      Quality of Life Scores   Health/Function Pre 19.03 %   Health/Function Post 17.17 %   Health/Function % Change -9.77 %   Socioeconomic Pre 23.29 %   Socioeconomic Post 20.33 %   Socioeconomic % Change  -12.71 %   Psych/Spiritual Pre 21.14 %   Psych/Spiritual Post 22.43 %   Psych/Spiritual % Change 6.1 %   Family Pre 28.8 %   Family Post 26.1 %   Family % Change -9.38 %   GLOBAL Pre 21.78 %   GLOBAL Post 20.21 %   GLOBAL % Change -  7.21 %      PHQ-9: Recent Review Flowsheet Data    Depression screen [facility] 2/9 04/05/2016 02/07/2016   Decreased Interest 2 2   Down, Depressed, Hopeless 2 1   PHQ - 2 Score 4 3   Altered sleeping 1 1   Tired, decreased energy 2 2   Change in appetite 2 0   Feeling bad or failure about yourself  2 2   Trouble concentrating 3 1   Moving slowly or fidgety/restless 3 2   Suicidal thoughts 0 0   PHQ-9 Score 17 11   Difficult doing work/chores Somewhat difficult Somewhat difficult      Psychosocial Evaluation and Intervention:     Psychosocial Evaluation - 02/14/16 0924      Psychosocial Evaluation & Interventions   Interventions Encouraged to exercise with the program and follow exercise prescription   Comments Counselor met with Paul Bass today for initial psychosocial evaluation.  He is a 70 year old who recently had open heart surgery.  His support system consists  mostly of his spouse of 7 years.  He reports being generally healthy other than a stroke in 2013 which causes him to be a "little slow in answering questions."  [name] reports sleeping well and having a good appetite.  He admits to a history of depression following his stroke, but is on medications which are helping with this currently.  He states his mood now is generally positive and he has minimal stress in his life other than his health and the realization of how that impacts his ability to do some things around the house, etc.  He has goals to "just feel better" and gain more energy.  He plans to walk more often for consistent exercise upon completion of this program.  Counselor will follow with Paul Bass throughout the course of this program.        Psychosocial Re-Evaluation:     Psychosocial Re-Evaluation    Row Name 02/23/16 1001 02/28/16 0917 03/03/16 0901 03/08/16 0945       Psychosocial Re-Evaluation   Interventions Therapist referral  -  -  -    Comments Counselor follow up with Paul Bass today following an education workshop on Depression.  He stated "I could be a poster child for this" and requested to meet with this counselor.  In assessing Paul Bass's mood and current symptoms, it is obvious his medications are not being fully effective at this time. Counselor recommended Paul Bass call his doctor about increasing his current dosage of Celexa, and counselor also recommended Paul Bass see a therapist.  Counselor provided contact information for a local therapist, and he is to call to see if she takes his insurance.  Counselor also recommended that Paul Bass spouse accompany him (with his approval) to at least the initial appointment since he has difficulty speaking at times and she could help provide a more thorough picture of symptoms and how his mood impacts his life at this time.  Counselor will continue to follow with Paul Bass.   Paul Bass stated today that he has an appointment  with his Dr. to discuss his mood next week.  He will subsequently call a counselor following that appointment but wanted to check with his dr. first.  Counselor commended Mr. Maxim for being proactive and will continue to follow up with him.   Cristina states he is able to do more at home since startiing Cardiac Rehab. Was able to do his  house chores without problem, He also stated that he is feeling better overall since starting the program  Counselor follow up with Paul Bass today reporting he met with his doctor about his medication needs to treat his mood.  He stated his Dr. began him on Bupropion 160m and he started it two days ago.  So far no negative side effects.  Dr. also stated it could be increased to 300 mg if needed.  Mr. WVandergriffcontinues to take the other medication for mood and it was at max. prescribed dosage so could not be increased - per his doctor's recommendation.  Mr. WZawadzkiis also actively trying to find a therapist that takes his insurance, but is having a hard time currently.  This counselor will continue to follow with Mr. WDemeyerabout this to see if his mood improves and if any success in finding a therapist.      Continued Psychosocial Services Needed Yes  Mr. WConsalvowas given contact information for a counselor and encouraged to speak with his Dr. about increased medication.  Counselor will follow with him while in this program.    -  -  -       Vocational Rehabilitation: Provide vocational rehab assistance to qualifying candidates.   Vocational Rehab Evaluation & Intervention:     Vocational Rehab - 02/07/16 1513      Initial Vocational Rehab Evaluation & Intervention   Assessment shows need for Vocational Rehabilitation No  Patient is retired      Education: Education Goals: Education classes will be provided on a weekly basis, covering required topics. Participant will state understanding/return demonstration of topics presented.  Learning  Barriers/Preferences:     Learning Barriers/Preferences - 02/07/16 1505      Learning Barriers/Preferences   Learning Barriers --  Had stroke in 2013 which affected his speech and writing.  Also, patient states he can read but it takes him longer to understand what he is reading.   Learning Preferences Individual Instruction      Education Topics: General Nutrition Guidelines/Fats and Fiber: -Group instruction provided by verbal, written material, models and posters to present the general guidelines for heart healthy nutrition. Gives an explanation and review of dietary fats and fiber. [facility]  Date  02/21/16  Educator  CR  Instruction Review Code  2- meets goals/outcomes      Controlling Sodium/Reading Food Labels: -Group verbal and written material supporting the discussion of sodium use in heart healthy nutrition. Review and explanation with models, verbal and written materials for utilization of the food label.   Exercise Physiology & Risk Factors: - Group verbal and written instruction with models to review the exercise physiology of the cardiovascular system and associated critical values. Details cardiovascular disease risk factors and the goals associated with each risk factor.   Aerobic Exercise & Resistance Training: - Gives group verbal and written discussion on the health impact of inactivity. On the components of aerobic and resistive training programs and the benefits of this training and how to safely progress through these programs. [facility]  Date  03/08/16  [facility]  [facility] Instruction Review Code  2- meets goals/outcomes      Flexibility, Balance, General Exercise Guidelines: - Provides group verbal and written instruction on the benefits of flexibility and balance training programs. Provides general exercise guidelines  with specific guidelines to those with  heart or lung disease. Demonstration and skill practice provided. Flowsheet Row Cardiac Rehab from 04/10/2016 in [facility] Cardiac and Pulmonary Rehab  Date  03/13/16  Educator  [facility]  Instruction Review Code  2- meets goals/outcomes      Stress Management: - Provides group verbal and written instruction about the health risks of elevated stress, cause of high stress, and healthy ways to reduce stress. Flowsheet Row Cardiac Rehab from 04/10/2016 in [facility] Cardiac and Pulmonary Rehab  Date  03/15/16  Educator  [facility]  Instruction Review Code  2- meets goals/outcomes      Depression: - Provides group verbal and written instruction on the correlation between heart/lung disease and depressed mood, treatment options, and the stigmas associated with seeking treatment. Flowsheet Row Cardiac Rehab from 04/10/2016 in [facility] Cardiac and Pulmonary Rehab  Date  02/23/16  Educator  [facility]  Instruction Review Code  2- meets goals/outcomes      Anatomy & Physiology of the Heart: - Group verbal and written instruction and models provide basic cardiac anatomy and physiology, with the coronary electrical and arterial systems. Review of: AMI, Angina, Valve disease, Heart Failure, Cardiac Arrhythmia, Pacemakers, and the ICD. Flowsheet Row Cardiac Rehab from 04/10/2016 in [facility] Cardiac and Pulmonary Rehab  Date  03/20/16  Educator  SB  Instruction Review Code  2- meets goals/outcomes      Cardiac Procedures: - Group verbal and written instruction and models to describe the testing methods done to diagnose heart disease. Reviews the outcomes of the test results. Describes the treatment choices: Medical Management, Angioplasty, or Coronary Bypass Surgery. Flowsheet Row Cardiac Rehab from 04/10/2016 in [facility] Cardiac and Pulmonary Rehab  Date  03/27/16  Educator  SB  Instruction Review Code  2- meets goals/outcomes      Cardiac Medications: - Group verbal and written instruction to  review commonly prescribed medications for heart disease. Reviews the medication, class of the drug, and side effects. Includes the steps to properly store meds and maintain the prescription regimen. Flowsheet Row Cardiac Rehab from 04/10/2016 in [facility] Cardiac and Pulmonary Rehab  Date  03/29/16  Educator  MA  Instruction Review Code  2- meets goals/outcomes [cardiac meds lecture 2]      Go Sex-Intimacy & Heart Disease, Get SMART - Goal Setting: - Group verbal and written instruction through game format to discuss heart disease and the return to sexual intimacy. Provides group verbal and written material to discuss and apply goal setting through the application of the S.M.A.R.T. Method. Flowsheet Row Cardiac Rehab from 04/10/2016 in [facility] Cardiac and Pulmonary Rehab  Date  03/27/16  Educator  SB  Instruction Review Code  2- meets goals/outcomes      Other Matters of the Heart: - Provides group verbal, written materials and models to describe Heart Failure, Angina, Valve Disease, and Diabetes in the realm of heart disease. Includes description of the disease process and treatment options available to the cardiac patient. Flowsheet Row Cardiac Rehab from 04/10/2016 in [facility] Cardiac and Pulmonary Rehab  Date  03/20/16  Educator  SB  Instruction Review Code  2- meets goals/outcomes      Exercise & Equipment Safety: - Individual verbal instruction and demonstration of equipment use and safety with use of the equipment. Flowsheet Row Cardiac Rehab from 04/10/2016 in [facility] Cardiac and Pulmonary Rehab  Date  02/07/16  Educator  D. Joya Gaskins, RN  Instruction Review Code  1- partially meets, needs review/practice      Infection Prevention: - Provides verbal and  written material to individual with discussion of infection control including proper hand washing and proper equipment cleaning during exercise session. Flowsheet Row Cardiac Rehab from 04/10/2016 in [facility] Cardiac and Pulmonary Rehab  Date   02/07/16  Educator  D. Joya Gaskins, RN  Instruction Review Code  2- meets goals/outcomes      Falls Prevention: - Provides verbal and written material to individual with discussion of falls prevention and safety. Flowsheet Row Cardiac Rehab from 04/10/2016 in [facility] Cardiac and Pulmonary Rehab  Date  02/07/16  Educator  D. Joya Gaskins, RN  Instruction Review Code  1- partially meets, needs review/practice      Diabetes: - Individual verbal and written instruction to review signs/symptoms of diabetes, desired ranges of glucose level fasting, after meals and with exercise. Advice that pre and post exercise glucose checks will be done for 3 sessions at entry of program. [place] from 04/10/2016 in [facility] Cardiac and Pulmonary Rehab  Date  02/07/16  Educator  D.Joya Gaskins, RN  Instruction Review Code  2- meets goals/outcomes       Knowledge Questionnaire Score:     Knowledge Questionnaire Score - 04/05/16 0953      Knowledge Questionnaire Score   Pre Score 22/28   Post Score 25/28      Core Components/Risk Factors/Patient Goals at Admission:     Personal Goals and Risk Factors at Admission - 02/07/16 1515      Core Components/Risk Factors/Patient Goals on Admission    Weight Management Weight Maintenance   Intervention Weight Management: Provide education and appropriate resources to help participant work on and attain dietary goals.   Admit Weight 227 lb (103 kg)   Goal Weight: Short Term 224 lb (101.6 kg)   Goal Weight: Long Term 224 lb (101.6 kg)   Expected Outcomes Short Term: Continue to assess and modify interventions until short term weight is achieved;Weight Maintenance: Understanding of the daily nutrition guidelines, which includes 25-35% calories from fat, 7% or less cal from saturated fats, less than 24m cholesterol, less than 1.5gm of sodium, & 5 or more servings of fruits and vegetables daily;Long Term: Adherence to nutrition and physical activity/exercise  program aimed toward attainment of established weight goal   Sedentary Yes   Intervention Provide advice, education, support and counseling about physical activity/exercise needs.;Develop an individualized exercise prescription for aerobic and resistive training based on initial evaluation findings, risk stratification, comorbidities and participant's personal goals.   Expected Outcomes Achievement of increased cardiorespiratory fitness and enhanced flexibility, muscular endurance and strength shown through measurements of functional capacity and personal statement of participant.   Increase Strength and Stamina Yes   Intervention Provide advice, education, support and counseling about physical activity/exercise needs.;Develop an individualized exercise prescription for aerobic and resistive training based on initial evaluation findings, risk stratification, comorbidities and participant's personal goals.   Expected Outcomes Achievement of increased cardiorespiratory fitness and enhanced flexibility, muscular endurance and strength shown through measurements of functional capacity and personal statement of participant.   Diabetes Yes   Intervention Provide education about signs/symptoms and action to take for hypo/hyperglycemia.;Provide education about proper nutrition, including hydration, and aerobic/resistive exercise prescription along with prescribed medications to achieve blood glucose in normal ranges: Fasting glucose 65-99 mg/dL   Expected Outcomes Short Term: Participant verbalizes understanding of the signs/symptoms and immediate care of hyper/hypoglycemia, proper foot care and importance of medication, aerobic/resistive exercise and nutrition plan for blood glucose control.;Long Term: Attainment of HbA1C < 7%.   Hypertension Yes  Intervention Provide education on lifestyle modifcations including regular physical activity/exercise, weight management, moderate sodium restriction and increased  consumption of fresh fruit, vegetables, and low fat dairy, alcohol moderation, and smoking cessation.;Monitor prescription use compliance.   Expected Outcomes Short Term: Continued assessment and intervention until BP is < 140/10m HG in hypertensive participants. < 130/847mHG in hypertensive participants with diabetes, heart failure or chronic kidney disease.;Long Term: Maintenance of blood pressure at goal levels.   Lipids Yes   Intervention Provide education and support for participant on nutrition & aerobic/resistive exercise along with prescribed medications to achieve LDL <70107mHDL >62m66m Expected Outcomes Short Term: Participant states understanding of desired cholesterol values and is compliant with medications prescribed. Participant is following exercise prescription and nutrition guidelines.;Long Term: Cholesterol controlled with medications as prescribed, with individualized exercise RX and with personalized nutrition plan. Value goals: LDL < 70mg35mL > 40 mg.   Stress Yes   Intervention Offer individual and/or small group education and counseling on adjustment to heart disease, stress management and health-related lifestyle change. Teach and support self-help strategies.;Refer participants experiencing significant psychosocial distress to appropriate mental health specialists for further evaluation and treatment. When possible, include family members and significant others in education/counseling sessions.   Expected Outcomes Short Term: Participant demonstrates changes in health-related behavior, relaxation and other stress management skills, ability to obtain effective social support, and compliance with psychotropic medications if prescribed.;Long Term: Emotional wellbeing is indicated by absence of clinically significant psychosocial distress or social isolation.      Core Components/Risk Factors/Patient Goals Review:      Goals and Risk Factor Review    Row Name 02/14/16 1418  03/01/16 1025 03/22/16 0951         Core Components/Risk Factors/Patient Goals Review   Personal Goals Review Weight Management/Obesity;Sedentary;Increase Strength and Stamina;Diabetes;Hypertension;Lipids;Stress Weight Management/Obesity;Sedentary;Increase Strength and Stamina;Diabetes;Hypertension;Lipids Weight Management/Obesity;Sedentary;Increase Strength and Stamina;Diabetes;Hypertension;Lipids     Review DavidMycahff [facility] is starting to [facility] better.  His weight, blood pressures, and blood sugars have all been stable. Today his fasting CBG was 137.  He has had no problems with his statin medication.  He is doing better with his stress levels as well. Calixto's weight has been fairly steady during the program.  His blood pressure are now running in the 110-120s/60s at rest.  He does not check his blod sugar at home, but has not noticed any highs or lows.  He had labs drawn on Monday for an appt next week.  He is not currently exercisng at home. DavidKellerbeen attending classes regularly and walking at home.  He says that his blood pressures and blood sugars have been good, although he admits to not checking them at home.  He has not had any issues with taking his statin.  However, he does note tat when he stands up quickly, he continues to have dizzy spells.  We talked about orthostatic pressures and the importance of staying hydrated and getting up slowly.     Expected Outcomes DavidJanari continue to come to exercise and education classes.  We will monitor blood pressure and continue to work with him on exercise and stress. DavidKendarious continue to come to exercise and education classes.  We will discuss home exercise plans soon.   We will continue to monitor DavidAdrenhe program, keeping a close eye on his blood pressure.  He will continue to attend education and exercise classes.  Core Components/Risk Factors/Patient Goals at Discharge (Final Review):       Goals and Risk Factor Review - 03/22/16 0951      Core Components/Risk Factors/Patient Goals Review   Personal Goals Review Weight Management/Obesity;Sedentary;Increase Strength and Stamina;Diabetes;Hypertension;Lipids   Review Camp has been attending classes regularly and walking at home.  He says that his blood pressures and blood sugars have been good, although he admits to not checking them at home.  He has not had any issues with taking his statin.  However, he does note tat when he stands up quickly, he continues to have dizzy spells.  We talked about orthostatic pressures and the importance of staying hydrated and getting up slowly.   Expected Outcomes We will continue to monitor Presten in the program, keeping a close eye on his blood pressure.  He will continue to attend education and exercise classes.      ITP Comments:     ITP Comments    Row Name 02/16/16 0757 03/15/16 0801 04/07/16 0854 04/12/16 0632     ITP Comments 30 day review. Continue with ITP 30 day review. Continue with ITP unless changes noted by Medical Director at signature of review. Humphrey reports that his doctor's office called him and told him to hold his Metoprolol since his heart rate in Cardiac Rehab at times  was discovered to be in the low 40's. Heart rate today was 76 upo arrival to exercise. 30 day review. Continue with ITP unless changes noted by Medical Director at signature of review.       Comments:

## 2016-04-12 NOTE — Progress Notes (Signed)
Discharge Summary  Patient Details  Name: Paul Bass MRN: [id number] Date of Birth: 17-Feb-1946 Referring Provider:   Flowsheet Row Cardiac Rehab from 02/07/2016 in [facility] Cardiac and Pulmonary Rehab  Referring Provider  Dwaine Deter MD.       Number of Visits: 36  Reason for Discharge:  Patient reached a stable level of exercise. Patient independent in their exercise.  Smoking History:  History  Smoking Status  . Former Smoker  . Packs/day: 2.00  . Types: Cigarettes  . Quit date: 05/20/2012  Smokeless Tobacco  . Never Used    Diagnosis:  S/P CABG x 3  ADL UCSD:   Initial Exercise Prescription:     Initial Exercise Prescription - 02/07/16 1400      Date of Initial Exercise RX and Referring Provider   Date 02/07/16   Referring Provider Dwaine Deter MD.     Treadmill   MPH 2   Grade 0   Minutes 15   METs 2.5     Bike   Level 2   Minutes 15   METs 3     NuStep   Level 3   Minutes 15   METs 3.5     REL-XR   Level 3   Minutes 15   METs 3     Prescription Details   Frequency (times per week) 3   Duration Progress to 30 minutes of continuous aerobic without signs/symptoms of physical distress     Intensity   THRR 40-80% of Max Heartrate 109-139   Ratings of Perceived Exertion 11-13   Perceived Dyspnea 0-4     Progression   Progression Continue progressive overload as per policy without signs/symptoms or physical distress.     Resistance Training   Training Prescription Yes   Weight 2   Reps 10-12      Discharge Exercise Prescription (Final Exercise Prescription Changes):     Exercise Prescription Changes - 04/05/16 1600      Exercise Review   Progression Yes     Response to Exercise   Blood Pressure (Admit) 108/64   Blood Pressure (Exercise) 132/64   Blood Pressure (Exit) 116/68   Heart Rate (Admit) 56 bpm   Heart Rate (Exercise) 83 bpm   Heart Rate (Exit) 51 bpm   Rating of Perceived Exertion (Exercise) 12   Symptoms none   Comments Home Exercise Guidelines given 03/03/16   Duration Progress to 45 minutes of aerobic exercise without signs/symptoms of physical distress   Intensity THRR unchanged     Progression   Progression Continue to progress workloads to maintain intensity without signs/symptoms of physical distress.   Average METs 2.72     Resistance Training   Training Prescription Yes   Weight 4 lbs   Reps 10-15     Interval Training   Interval Training No     Treadmill   MPH 2.5   Grade 1.5   Minutes 15   METs 3.26     Recumbant Elliptical   Level 2   RPM 50   Minutes 15   METs 2.1     T5 Nustep   Level 3   Minutes 15   METs 2.8     Home Exercise Plan   Plans to continue exercise at Home  walking and treadmill   Frequency Add 3 additional days to program exercise sessions.      Functional Capacity:     6 Minute Walk    Row Name 02/07/16 1429 03/29/16  0946       6 Minute Walk   Phase Initial Discharge    Distance 1086 feet 1305 feet    Distance % Change  - 20 %    Walk Time 6 minutes 6 minutes    # of Rest Breaks 0 0    MPH 2 2.47    METS 2.58 3.05    RPE 13 13    VO2 Peak  - 10.7    Symptoms No No    Resting HR 58 bpm 81 bpm    Resting BP 114/66 122/70    Max Ex. HR 70 bpm 107 bpm    Max Ex. BP 140/70 170/66       Psychological, QOL, Others - Outcomes: PHQ 2/9: Depression screen [facility] 2/9 04/05/2016 02/07/2016  Decreased Interest 2 2  Down, Depressed, Hopeless 2 1  PHQ - 2 Score 4 3  Altered sleeping 1 1  Tired, decreased energy 2 2  Change in appetite 2 0  Feeling bad or failure about yourself  2 2  Trouble concentrating 3 1  Moving slowly or fidgety/restless 3 2  Suicidal thoughts 0 0  PHQ-9 Score 17 11  Difficult doing work/chores Somewhat difficult Somewhat difficult    Quality of Life:     Quality of Life - 04/05/16 0954      Quality of Life Scores   Health/Function Pre 19.03 %   Health/Function Post 17.17 %   Health/Function  % Change -9.77 %   Socioeconomic Pre 23.29 %   Socioeconomic Post 20.33 %   Socioeconomic % Change  -12.71 %   Psych/Spiritual Pre 21.14 %   Psych/Spiritual Post 22.43 %   Psych/Spiritual % Change 6.1 %   Family Pre 28.8 %   Family Post 26.1 %   Family % Change -9.38 %   GLOBAL Pre 21.78 %   GLOBAL Post 20.21 %   GLOBAL % Change -7.21 %      Personal Goals: Goals established at orientation with interventions provided to work toward goal.     Personal Goals and Risk Factors at Admission - 02/07/16 1515      Core Components/Risk Factors/Patient Goals on Admission    Weight Management Weight Maintenance   Intervention Weight Management: Provide education and appropriate resources to help participant work on and attain dietary goals.   Admit Weight 227 lb (103 kg)   Goal Weight: Short Term 224 lb (101.6 kg)   Goal Weight: Long Term 224 lb (101.6 kg)   Expected Outcomes Short Term: Continue to assess and modify interventions until short term weight is achieved;Weight Maintenance: Understanding of the daily nutrition guidelines, which includes 25-35% calories from fat, 7% or less cal from saturated fats, less than 200mg  cholesterol, less than 1.5gm of sodium, & 5 or more servings of fruits and vegetables daily;Long Term: Adherence to nutrition and physical activity/exercise program aimed toward attainment of established weight goal   Sedentary Yes   Intervention Provide advice, education, support and counseling about physical activity/exercise needs.;Develop an individualized exercise prescription for aerobic and resistive training based on initial evaluation findings, risk stratification, comorbidities and participant's personal goals.   Expected Outcomes Achievement of increased cardiorespiratory fitness and enhanced flexibility, muscular endurance and strength shown through measurements of functional capacity and personal statement of participant.   Increase Strength and Stamina Yes    Intervention Provide advice, education, support and counseling about physical activity/exercise needs.;Develop an individualized exercise prescription for aerobic and resistive training based on initial  evaluation findings, risk stratification, comorbidities and participant's personal goals.   Expected Outcomes Achievement of increased cardiorespiratory fitness and enhanced flexibility, muscular endurance and strength shown through measurements of functional capacity and personal statement of participant.   Diabetes Yes   Intervention Provide education about signs/symptoms and action to take for hypo/hyperglycemia.;Provide education about proper nutrition, including hydration, and aerobic/resistive exercise prescription along with prescribed medications to achieve blood glucose in normal ranges: Fasting glucose 65-99 mg/dL   Expected Outcomes Short Term: Participant verbalizes understanding of the signs/symptoms and immediate care of hyper/hypoglycemia, proper foot care and importance of medication, aerobic/resistive exercise and nutrition plan for blood glucose control.;Long Term: Attainment of HbA1C < 7%.   Hypertension Yes   Intervention Provide education on lifestyle modifcations including regular physical activity/exercise, weight management, moderate sodium restriction and increased consumption of fresh fruit, vegetables, and low fat dairy, alcohol moderation, and smoking cessation.;Monitor prescription use compliance.   Expected Outcomes Short Term: Continued assessment and intervention until BP is < 140/1mm HG in hypertensive participants. < 130/23mm HG in hypertensive participants with diabetes, heart failure or chronic kidney disease.;Long Term: Maintenance of blood pressure at goal levels.   Lipids Yes   Intervention Provide education and support for participant on nutrition & aerobic/resistive exercise along with prescribed medications to achieve LDL 70mg , HDL >40mg .   Expected Outcomes Short  Term: Participant states understanding of desired cholesterol values and is compliant with medications prescribed. Participant is following exercise prescription and nutrition guidelines.;Long Term: Cholesterol controlled with medications as prescribed, with individualized exercise RX and with personalized nutrition plan. Value goals: LDL < 70mg , HDL > 40 mg.   Stress Yes   Intervention Offer individual and/or small group education and counseling on adjustment to heart disease, stress management and health-related lifestyle change. Teach and support self-help strategies.;Refer participants experiencing significant psychosocial distress to appropriate mental health specialists for further evaluation and treatment. When possible, include family members and significant others in education/counseling sessions.   Expected Outcomes Short Term: Participant demonstrates changes in health-related behavior, relaxation and other stress management skills, ability to obtain effective social support, and compliance with psychotropic medications if prescribed.;Long Term: Emotional wellbeing is indicated by absence of clinically significant psychosocial distress or social isolation.       Personal Goals Discharge:     Goals and Risk Factor Review    Row Name 02/14/16 1418 03/01/16 1025 03/22/16 0951         Core Components/Risk Factors/Patient Goals Review   Personal Goals Review Weight Management/Obesity;Sedentary;Increase Strength and Stamina;Diabetes;Hypertension;Lipids;Stress Weight Management/Obesity;Sedentary;Increase Strength and Stamina;Diabetes;Hypertension;Lipids Weight Management/Obesity;Sedentary;Increase Strength and Stamina;Diabetes;Hypertension;Lipids     Review Perlie is off to a good start with exercise in [facility].  He is starting to feel better.  His weight, blood pressures, and blood sugars have all been stable. Today his fasting CBG was 137.  He has had no problems with his statin medication.   He is doing better with his stress levels as well. Liston's weight has been fairly steady during the program.  His blood pressure are now running in the 110-120s/60s at rest.  He does not check his blod sugar at home, but has not noticed any highs or lows.  He had labs drawn on Monday for an appt next week.  He is not currently exercisng at home. Beckam has been attending classes regularly and walking at home.  He says that his blood pressures and blood sugars have been good, although he admits to not checking them at home.  He  has not had any issues with taking his statin.  However, he does note tat when he stands up quickly, he continues to have dizzy spells.  We talked about orthostatic pressures and the importance of staying hydrated and getting up slowly.     Expected Outcomes Aryav will continue to come to exercise and education classes.  We will monitor blood pressure and continue to work with him on exercise and stress. Yaden will continue to come to exercise and education classes.  We will discuss home exercise plans soon.   We will continue to monitor Nemesio in the program, keeping a close eye on his blood pressure.  He will continue to attend education and exercise classes.        Nutrition & Weight - Outcomes:     Pre Biometrics - 02/07/16 1439      Pre Biometrics   Height 5' 11.5" (1.816 m)   Weight 227 lb (103 kg)   Waist Circumference 44.5 inches   Hip Circumference 41.5 inches   Waist to Hip Ratio 1.07 %   BMI (Calculated) 31.3   Single Leg Stand 5 seconds         Post Biometrics - 03/29/16 0944       Post  Biometrics   Height 5' 11.5" (1.816 m)   Weight 233 lb (105.7 kg)   Waist Circumference 46 inches   Hip Circumference 41.5 inches   Waist to Hip Ratio 1.11 %   BMI (Calculated) 32.1   Single Leg Stand 5 seconds      Nutrition:     Nutrition Therapy & Goals - 02/23/16 1118      Personal Nutrition Goals   Additional Goals? No   Comments Pt declined individual  nutrition appt.      Nutrition Discharge:     Nutrition Assessments - 04/05/16 0954      Rate Your Plate Scores   Pre Score 56   Pre Score % 62.2 %   Post Score 50   Post Score % 56 %   % Change -6.2 %      Education Questionnaire Score:     Knowledge Questionnaire Score - 04/05/16 0953      Knowledge Questionnaire Score   Pre Score 22/28   Post Score 25/28      Goals reviewed with patient; copy given to patient.

## 2016-04-12 NOTE — Progress Notes (Signed)
Cardiac Individual Treatment Plan  Patient Details  Name: Paul Bass MRN: [id number] Date of Birth: [birth year] Referring Provider:   Flowsheet Row Cardiac Rehab from 02/07/2016 in [facility] Cardiac and Pulmonary Rehab  Referring Provider  Dwaine Deter MD.      Initial Encounter Date:  Flowsheet Row Cardiac Rehab from 02/07/2016 in [facility] Cardiac and Pulmonary Rehab  Date  02/07/16  Referring Provider  Dwaine Deter MD.      Visit Diagnosis: S/P CABG x 3  Patient's Home Medications on Admission:  Current Outpatient Prescriptions:  .  albuterol (PROVENTIL HFA;VENTOLIN HFA) 108 (90 BASE) MCG/ACT inhaler, Inhale 2 puffs into the lungs every 6 (six) hours as needed for wheezing or shortness of breath. For shortness of breath, Disp: , Rfl:  .  amiodarone (PACERONE) 200 MG tablet, TAKE 2 TABLETS (400 MG TOTAL) BY MOUTH DAILY., Disp: , Rfl: 11 .  aspirin EC 81 MG tablet, Take 81 mg by mouth daily., Disp: , Rfl:  .  atorvastatin (LIPITOR) 80 MG tablet, Take 80 mg by mouth., Disp: , Rfl:  .  budesonide-formoterol (SYMBICORT) 160-4.5 MCG/ACT inhaler, Inhale 2 puffs into the lungs 2 (two) times daily., Disp: , Rfl:  .  cholecalciferol (VITAMIN D) 400 UNITS TABS, Take 400 Units by mouth 2 (two) times daily., Disp: , Rfl:  .  citalopram (CELEXA) 20 MG tablet, Take 20 mg by mouth daily., Disp: , Rfl:  .  clonazePAM (KLONOPIN) 0.5 MG tablet, Take 0.5 mg by mouth 2 (two) times daily as needed for anxiety., Disp: , Rfl:  .  clopidogrel (PLAVIX) 75 MG tablet, TAKE 1 TABLET (75 MG TOTAL) BY MOUTH ONCE DAILY., Disp: , Rfl: 1 .  Coenzyme Q10 (CO Q-10) 200 MG CAPS, Take 1 capsule by mouth daily., Disp: , Rfl:  .  glucose blood test strip, , Disp: , Rfl:  .  levothyroxine (SYNTHROID, LEVOTHROID) 100 MCG tablet, Take 100 mcg by mouth daily before breakfast., Disp: , Rfl:  .  losartan (COZAAR) 25 MG tablet, Take by mouth., Disp: , Rfl:  .  metFORMIN (GLUCOPHAGE) 500 MG tablet, TAKE 1 TABLET BY MOUTH  ONCE A DAY, Disp: , Rfl:  .  Multiple Vitamin (MULTIVITAMIN WITH MINERALS) TABS, Take 0.5 tablets by mouth 2 (two) times daily., Disp: , Rfl:  .  Omega-3 Fatty Acids (FISH OIL) 1200 MG CAPS, Take 1,000 mg by mouth 2 (two) times daily., Disp: , Rfl:  .  omeprazole (PRILOSEC) 20 MG capsule, Take 20 mg by mouth daily., Disp: , Rfl:  .  PSYLLIUM PO, Take by mouth., Disp: , Rfl:  .  tamsulosin (FLOMAX) 0.4 MG CAPS capsule, Take 0.4 mg by mouth daily. , Disp: , Rfl:   Past Medical History: Past Medical History:  Diagnosis Date  . Anxiety   . BPH (benign prostatic hypertrophy)   . CAD S/P percutaneous coronary angioplasty   . COPD (chronic obstructive pulmonary disease) ([place])   . Depression   . HTN (hypertension)   . Hyperlipidemia   . Hypothyroidism   . Stroke ([place])   . Tobacco abuse     Tobacco Use: History  Smoking Status  . Former Smoker  . Packs/day: 2.00  . Types: Cigarettes  . Quit date: 05/20/2012  Smokeless Tobacco  . Never Used    Labs: Recent Review Flowsheet Data    Labs for ITP Cardiac and Pulmonary Rehab Latest Ref Rng & Units 05/12/2012 05/12/2012 05/13/2012   Cholestrol 0 - 200 mg/dL - - 119  LDLCALC 0 - 99 mg/dL - - 64   HDL >39 mg/dL - - 40   Trlycerides <150 mg/dL - - 76   Hemoglobin A1c <5.7 % - - 6.3(H)   PHART 7.350 - 7.450 7.243(L) 7.342(L) -   PCO2ART 35.0 - 45.0 mmHg 55.0(H) 48.4(H) -   HCO3 20.0 - 24.0 mEq/L 22.9 25.5(H) -   TCO2 0 - 100 mmol/L 24.6 27.0 -   ACIDBASEDEF 0.0 - 2.0 mmol/L 3.4(H) - -   O2SAT % 99.2 98.0 -       Exercise Target Goals:    Exercise Program Goal: Individual exercise prescription set with THRR, safety & activity barriers. Participant demonstrates ability to understand and report RPE using BORG scale, to self-measure pulse accurately, and to acknowledge the importance of the exercise prescription.  Exercise Prescription Goal: Starting with aerobic activity 30 plus minutes a day, 3 days per week for initial exercise  prescription. Provide home exercise prescription and guidelines that participant acknowledges understanding prior to discharge.  Activity Barriers & Risk Stratification:     Activity Barriers & Cardiac Risk Stratification - 02/07/16 1502      Activity Barriers & Cardiac Risk Stratification   Activity Barriers Back Problems;Muscular Weakness;Shortness of Breath   Cardiac Risk Stratification High      6 Minute Walk:     6 Minute Walk    Row Name 02/07/16 1429 03/29/16 0946       6 Minute Walk   Phase Initial Discharge    Distance 1086 feet 1305 feet    Distance % Change  - 20 %    Walk Time 6 minutes 6 minutes    # of Rest Breaks 0 0    MPH 2 2.47    METS 2.58 3.05    RPE 13 13    VO2 Peak  - 10.7    Symptoms No No    Resting HR 58 bpm 81 bpm    Resting BP 114/66 122/70    Max Ex. HR 70 bpm 107 bpm    Max Ex. BP 140/70 170/66       Initial Exercise Prescription:     Initial Exercise Prescription - 02/07/16 1400      Date of Initial Exercise RX and Referring Provider   Date 02/07/16   Referring Provider Dwaine Deter MD.     Treadmill   MPH 2   Grade 0   Minutes 15   METs 2.5     Bike   Level 2   Minutes 15   METs 3     NuStep   Level 3   Minutes 15   METs 3.5     REL-XR   Level 3   Minutes 15   METs 3     Prescription Details   Frequency (times per week) 3   Duration Progress to 30 minutes of continuous aerobic without signs/symptoms of physical distress     Intensity   THRR 40-80% of Max Heartrate 109-139   Ratings of Perceived Exertion 11-13   Perceived Dyspnea 0-4     Progression   Progression Continue progressive overload as per policy without signs/symptoms or physical distress.     Resistance Training   Training Prescription Yes   Weight 2   Reps 10-12      Perform Capillary Blood Glucose checks as needed.  Exercise Prescription Changes:     Exercise Prescription Changes    Row Name 02/23/16 1100 03/03/16 1000  03/09/16 1100 03/23/16  1400 04/05/16 1600     Exercise Review   Progression Yes Yes Yes Yes Yes     Response to Exercise   Blood Pressure (Admit) 120/64  - 116/53 134/64 108/64   Blood Pressure (Exercise) 136/64  - 140/58 138/62 132/64   Blood Pressure (Exit) 120/64  - 112/62 116/64 116/68   Heart Rate (Admit) 70 bpm  - 59 bpm 61 bpm 56 bpm   Heart Rate (Exercise) 112 bpm  - 110 bpm 97 bpm 83 bpm   Heart Rate (Exit) 60 bpm  - 56 bpm 55 bpm 51 bpm   Rating of Perceived Exertion (Exercise) 12  - '12 12 12   ' Symptoms none none none none none   Comments  - Home Exercise Guidelines given 03/03/16 Home Exercise Guidelines given 03/03/16 Home Exercise Guidelines given 03/03/16 Home Exercise Guidelines given 03/03/16   Duration Progress to 45 minutes of aerobic exercise without signs/symptoms of physical distress Progress to 45 minutes of aerobic exercise without signs/symptoms of physical distress Progress to 45 minutes of aerobic exercise without signs/symptoms of physical distress Progress to 45 minutes of aerobic exercise without signs/symptoms of physical distress Progress to 45 minutes of aerobic exercise without signs/symptoms of physical distress   Intensity THRR unchanged THRR unchanged THRR unchanged THRR unchanged THRR unchanged     Progression   Progression Continue to progress workloads to maintain intensity without signs/symptoms of physical distress. Continue to progress workloads to maintain intensity without signs/symptoms of physical distress. Continue to progress workloads to maintain intensity without signs/symptoms of physical distress. Continue to progress workloads to maintain intensity without signs/symptoms of physical distress. Continue to progress workloads to maintain intensity without signs/symptoms of physical distress.   Average METs 2.99 2.99 3.12 3.08 2.72     Resistance Training   Training Prescription Yes Yes Yes Yes Yes   Weight 3 lbs 3 lbs 3 lbs 4 lba 4 lbs   Reps 10-12  10-12 10-12 10-15 10-15     Interval Training   Interval Training No No No No No     Treadmill   MPH '2 2 2 ' 2.3 2.5   Grade '1 1 1 ' 1.5 1.5   Minutes '15 15 15 15 15   ' METs 2.81 2.81 2.81 3.08 3.26     Bike   Level 1.5 1.5 1.5 1.5  -   Minutes '15 15 15 15  ' -   METs 3.75 3.75 3.75 3.75  -     NuStep   Level 3 3  -  -  -   Minutes 15 15  -  -  -   METs 2.4 2.4  -  -  -     Recumbant Elliptical   Level  -  -  -  - 2   RPM  -  -  -  - 50   Minutes  -  -  -  - 15   METs  -  -  -  - 2.1     T5 Nustep   Level  -  - '3 3 3   ' Minutes  -  - '15 15 15   ' METs  -  - 2.8 2.7 2.8     Home Exercise Plan   Plans to continue exercise at  - Home  walking and treadmill Home  walking and treadmill Home  walking and treadmill Home  walking and treadmill   Frequency  - Add 3 additional days to program exercise sessions. Add  3 additional days to program exercise sessions. Add 3 additional days to program exercise sessions. Add 3 additional days to program exercise sessions.      Exercise Comments:     Exercise Comments    Row Name 02/23/16 1120 02/25/16 1024 03/03/16 1033 03/09/16 1113 03/10/16 0957   Exercise Comments Abbas is off to a good start with exercise.  He is now able to do all 30 or 45 min continuos.  Today we added more incline to treadmill.  We will continue to monitor for progression. Reviewed METs average and discussed progression with pt today. Reviewed home exercise with pt today.  Pt plans to walk and use treadmill at home for exercise.  Reviewed THR, pulse, RPE, sign and symptoms, and when to call 911 or MD.  Also discussed weather considerations and indoor options.  Pt voiced understanding. Marks is doing well with exercise.  He has noticed a difference for himself.  He is starting to want to push himself more.  We will continue to monitor. Reviewed METs average and discussed progression with pt today.   Row Name 03/23/16 1452 03/24/16 0936 04/05/16 1612 04/10/16 0843 04/12/16  0755   Exercise Comments Jp has been doing well with exercise.  He has started to increase his treadmill.  We will continue to monitor for progression. Reviewed METs average and discussed progression with pt today. Rob has been doing well with exercise.  He is walking all the way around the block on his off days.  He does use his treadmill when the weather is bad.  We will continue to monitor his progression. Reviewed METs average and discussed progression with pt today.  Mylo graduated today from cardiac rehab with 36 sessions completed.  Details of the patient's exercise prescription and what He needs to do in order to continue the prescription and progress were discussed with patient.  Patient was given a copy of prescription and goals.  Patient verbalized understanding.  Vicent plans to continue to exercise by walking and using treadmill at home.      Discharge Exercise Prescription (Final Exercise Prescription Changes):     Exercise Prescription Changes - 04/05/16 1600      Exercise Review   Progression Yes     Response to Exercise   Blood Pressure (Admit) 108/64   Blood Pressure (Exercise) 132/64   Blood Pressure (Exit) 116/68   Heart Rate (Admit) 56 bpm   Heart Rate (Exercise) 83 bpm   Heart Rate (Exit) 51 bpm   Rating of Perceived Exertion (Exercise) 12   Symptoms none   Comments Home Exercise Guidelines given 03/03/16   Duration Progress to 45 minutes of aerobic exercise without signs/symptoms of physical distress   Intensity THRR unchanged     Progression   Progression Continue to progress workloads to maintain intensity without signs/symptoms of physical distress.   Average METs 2.72     Resistance Training   Training Prescription Yes   Weight 4 lbs   Reps 10-15     Interval Training   Interval Training No     Treadmill   MPH 2.5   Grade 1.5   Minutes 15   METs 3.26     Recumbant Elliptical   Level 2   RPM 50   Minutes 15   METs 2.1     T5 Nustep    Level 3   Minutes 15   METs 2.8     Home Exercise Plan   Plans to continue exercise  at Home  walking and treadmill   Frequency Add 3 additional days to program exercise sessions.      Nutrition:  Target Goals: Understanding of nutrition guidelines, daily intake of sodium <1511m, cholesterol <2083m calories 30% from fat and 7% or less from saturated fats, daily to have 5 or more servings of fruits and vegetables.  Biometrics:     Pre Biometrics - 02/07/16 1439      Pre Biometrics   Height 5' 11.5" (1.816 m)   Weight 227 lb (103 kg)   Waist Circumference 44.5 inches   Hip Circumference 41.5 inches   Waist to Hip Ratio 1.07 %   BMI (Calculated) 31.3   Single Leg Stand 5 seconds         Post Biometrics - 03/29/16 0944       Post  Biometrics   Height 5' 11.5" (1.816 m)   Weight 233 lb (105.7 kg)   Waist Circumference 46 inches   Hip Circumference 41.5 inches   Waist to Hip Ratio 1.11 %   BMI (Calculated) 32.1   Single Leg Stand 5 seconds      Nutrition Therapy Plan and Nutrition Goals:     Nutrition Therapy & Goals - 02/23/16 1118      Personal Nutrition Goals   Additional Goals? No   Comments Pt declined individual nutrition appt.      Nutrition Discharge: Rate Your Plate Scores:     Nutrition Assessments - 04/05/16 0954      Rate Your Plate Scores   Pre Score 56   Pre Score % 62.2 %   Post Score 50   Post Score % 56 %   % Change -6.2 %      Nutrition Goals Re-Evaluation:   Psychosocial: Target Goals: Acknowledge presence or absence of depression, maximize coping skills, provide positive support system. Participant is able to verbalize types and ability to use techniques and skills needed for reducing stress and depression.  Initial Review & Psychosocial Screening:     Initial Psych Review & Screening - 02/07/16 1518      Initial Review   Current issues with Current Depression;History of Depression;Current Psychotropic Meds;Current Sleep  Concerns;Current Stress Concerns   Source of Stress Concerns Unable to perform yard/household activities;Chronic Illness   Comments DaIzanad a stroke in 2013 which affected his speech; ability to write; and comprehension.  States he can read, but may take him longer to understand what he is reading.       Family Dynamics   Good Support System? Yes  DaJuwuantates his wife LiVaughan Bastas very supportive.  He has one child and one grandchild, which he states they are not close.  No church affiliation.       Barriers   Psychosocial barriers to participate in program Psychosocial barriers identified (see note)  See above comments.  DaSaints currently on Celexa 2013mnce a day.  DavOrleys a history of depression and has been to counseling in the past.  He states he does not feel he needs counseling now.       Screening Interventions   Interventions Encouraged to exercise;Program counselor consult      Quality of Life Scores:     Quality of Life - 04/05/16 0954      Quality of Life Scores   Health/Function Pre 19.03 %   Health/Function Post 17.17 %   Health/Function % Change -9.77 %   Socioeconomic Pre 23.29 %   Socioeconomic Post  20.33 %   Socioeconomic % Change  -12.71 %   Psych/Spiritual Pre 21.14 %   Psych/Spiritual Post 22.43 %   Psych/Spiritual % Change 6.1 %   Family Pre 28.8 %   Family Post 26.1 %   Family % Change -9.38 %   GLOBAL Pre 21.78 %   GLOBAL Post 20.21 %   GLOBAL % Change -7.21 %      PHQ-9: Recent Review Flowsheet Data    Depression screen [facility] 2/9 04/05/2016 02/07/2016   Decreased Interest 2 2   Down, Depressed, Hopeless 2 1   PHQ - 2 Score 4 3   Altered sleeping 1 1   Tired, decreased energy 2 2   Change in appetite 2 0   Feeling bad or failure about yourself  2 2   Trouble concentrating 3 1   Moving slowly or fidgety/restless 3 2   Suicidal thoughts 0 0   PHQ-9 Score 17 11   Difficult doing work/chores Somewhat difficult Somewhat difficult       Psychosocial Evaluation and Intervention:     Psychosocial Evaluation - 02/14/16 0924      Psychosocial Evaluation & Interventions   Interventions Encouraged to exercise with the program and follow exercise prescription   Comments Counselor met with Mr. Clingan today for initial psychosocial evaluation.  He is a 70 year old who recently had open heart surgery.  His support system consists mostly of his spouse of 7 years.  He reports being generally healthy other than a stroke in 2013 which causes him to be a "little slow in answering questions."  Mr. Vester reports sleeping well and having a good appetite.  He admits to a history of depression following his stroke, but is on medications which are helping with this currently.  He states his mood now is generally positive and he has minimal stress in his life other than his health and the realization of how that impacts his ability to do some things around the house, etc.  He has goals to "just feel better" and gain more energy.  He plans to walk more often for consistent exercise upon completion of this program.  Counselor will follow with Mr. Sasaki throughout the course of this program.        Psychosocial Re-Evaluation:     Psychosocial Re-Evaluation    Row Name 02/23/16 1001 02/28/16 0917 03/03/16 0901 03/08/16 0945       Psychosocial Re-Evaluation   Interventions Therapist referral  -  -  -    Comments Counselor follow up with Mr. Vera today following an education workshop on Depression.  He stated "I could be a poster child for this" and requested to meet with this counselor.  In assessing Mr. Olesky's mood and current symptoms, it is obvious his medications are not being fully effective at this time. Counselor recommended Mr. Peixoto call his doctor about increasing his current dosage of Celexa, and counselor also recommended Mr. Viona Gilmore see a therapist.  Counselor provided contact information for a local therapist, and he is to call to see  if she takes his insurance.  Counselor also recommended that Mr. Jamelle Haring spouse accompany him (with his approval) to at least the initial appointment since he has difficulty speaking at times and she could help provide a more thorough picture of symptoms and how his mood impacts his life at this time.  Counselor will continue to follow with Mr. Kitt.   Mr. Balingit stated today that he has an  appointment with his Dr. to discuss his mood next week.  He will subsequently call a counselor following that appointment but wanted to check with his dr. first.  Counselor commended Mr. Faries for being proactive and will continue to follow up with him.   Thelbert states he is able to do more at home since startiing Cardiac Rehab. Was able to do his house chores without problem, He also stated that he is feeling better overall since starting the program  Counselor follow up with Mr. Gunther today reporting he met with his doctor about his medication needs to treat his mood.  He stated his Dr. began him on Bupropion 113m and he started it two days ago.  So far no negative side effects.  Dr. also stated it could be increased to 300 mg if needed.  Mr. WDantoniocontinues to take the other medication for mood and it was at max. prescribed dosage so could not be increased - per his doctor's recommendation.  Mr. WMcgeehanis also actively trying to find a therapist that takes his insurance, but is having a hard time currently.  This counselor will continue to follow with Mr. WBinetteabout this to see if his mood improves and if any success in finding a therapist.      Continued Psychosocial Services Needed Yes  Mr. WYohanneswas given contact information for a counselor and encouraged to speak with his Dr. about increased medication.  Counselor will follow with him while in this program.    -  -  -       Vocational Rehabilitation: Provide vocational rehab assistance to qualifying candidates.   Vocational Rehab Evaluation & Intervention:      Vocational Rehab - 02/07/16 1513      Initial Vocational Rehab Evaluation & Intervention   Assessment shows need for Vocational Rehabilitation No  Patient is retired      Education: Education Goals: Education classes will be provided on a weekly basis, covering required topics. Participant will state understanding/return demonstration of topics presented.  Learning Barriers/Preferences:     Learning Barriers/Preferences - 02/07/16 1505      Learning Barriers/Preferences   Learning Barriers --  Had stroke in 2013 which affected his speech and writing.  Also, patient states he can read but it takes him longer to understand what he is reading.   Learning Preferences Individual Instruction      Education Topics: General Nutrition Guidelines/Fats and Fiber: -Group instruction provided by verbal, written material, models and posters to present the general guidelines for heart healthy nutrition. Gives an explanation and review of dietary fats and fiber. [facility]  Date  02/21/16  Educator  CR  Instruction Review Code  2- meets goals/outcomes      Controlling Sodium/Reading Food Labels: -Group verbal and written material supporting the discussion of sodium use in heart healthy nutrition. Review and explanation with models, verbal and written materials for utilization of the food label.   Exercise Physiology & Risk Factors: - Group verbal and written instruction with models to review the exercise physiology of the cardiovascular system and associated critical values. Details cardiovascular disease risk factors and the goals associated with each risk factor.   Aerobic Exercise & Resistance Training: - Gives group verbal and written discussion on the health impact of inactivity. On the components of aerobic and resistive training programs and the benefits of this training and how to safely progress through these  programs. Flowsheet Row Cardiac Rehab from 04/10/2016 in [facility] Cardiac and Pulmonary Rehab  Date  03/08/16  Educator  [facility]  Instruction Review Code  2- meets goals/outcomes      Flexibility, Balance, General Exercise Guidelines: - Provides group verbal and written instruction on the benefits of flexibility and balance training programs. Provides general exercise guidelines with specific guidelines to those with heart or lung disease. Demonstration and skill practice provided. Flowsheet Row Cardiac Rehab from 04/10/2016 in [facility] Cardiac and Pulmonary Rehab  Date  03/13/16  Educator  [facility]  Instruction Review Code  2- meets goals/outcomes      Stress Management: - Provides group verbal and written instruction about the health risks of elevated stress, cause of high stress, and healthy ways to reduce stress. Flowsheet Row Cardiac Rehab from 04/10/2016 in [facility] Cardiac and Pulmonary Rehab  Date  03/15/16  Educator  [facility]  Instruction Review Code  2- meets goals/outcomes      Depression: - Provides group verbal and written instruction on the correlation between heart/lung disease and depressed mood, treatment options, and the stigmas associated with seeking treatment. Flowsheet Row Cardiac Rehab from 04/10/2016 in [facility] Cardiac and Pulmonary Rehab  Date  02/23/16  Educator  [facility]  Instruction Review Code  2- meets goals/outcomes      Anatomy & Physiology of the Heart: - Group verbal and written instruction and models provide basic cardiac anatomy and physiology, with the coronary electrical and arterial systems. Review of: AMI, Angina, Valve disease, Heart Failure, Cardiac Arrhythmia, Pacemakers, and the ICD. Flowsheet Row Cardiac Rehab from 04/10/2016 in [facility] Cardiac and Pulmonary Rehab  Date  03/20/16  Educator  SB  Instruction Review Code  2- meets goals/outcomes      Cardiac Procedures: - Group verbal and written instruction and models to describe the testing methods done to diagnose heart  disease. Reviews the outcomes of the test results. Describes the treatment choices: Medical Management, Angioplasty, or Coronary Bypass Surgery. Flowsheet Row Cardiac Rehab from 04/10/2016 in [facility] Cardiac and Pulmonary Rehab  Date  03/27/16  Educator  SB  Instruction Review Code  2- meets goals/outcomes      Cardiac Medications: - Group verbal and written instruction to review commonly prescribed medications for heart disease. Reviews the medication, class of the drug, and side effects. Includes the steps to properly store meds and maintain the prescription regimen. Flowsheet Row Cardiac Rehab from 04/10/2016 in [place] [facility] Pul[facility] Re[facility]  Date  03/29/16  Educator  MA  Instruction Review Code  2- meets goals/outcomes [cardiac meds lecture 2]      Go Sex-Intimacy & Heart Disease, Get SMART - Goal Setting: - Group verbal and written instruction through game format to discuss heart disease and the return to sexual intimacy. Provides group verbal and written material to discuss and apply goal setting through the application of the S.M.A.R.T. Method. Flowsheet Row Cardiac Rehab from 04/10/2016 in [facility] Cardiac and Pulmonary Rehab  Date  03/27/16  Educator  SB  Instruction Review Code  2- meets goals/outcomes      Other Matters of the Heart: - Provides group verbal, written materials and models to describe Heart Failure, Angina, Valve Disease, and Diabetes in the realm of heart disease. Includes description of the disease process and treatment options available to the cardiac patient. Flowsheet Row Cardiac Rehab from 04/10/2016 in [facility] Cardiac and Pulmonary Rehab  Date  03/20/16  Educator  SB  Instruction Review Code  2- meets goals/outcomes  Exercise & Equipment Safety: - Individual verbal instruction and demonstration of equipment use and safety with use of the equipment. Flowsheet Row Cardiac Rehab from 04/10/2016 in [facility] Cardiac and Pulmonary Rehab  Date  02/07/16  Educator   D. Joya Gaskins, RN  Instruction Review Code  1- partially meets, needs review/practice      Infection Prevention: - Provides verbal and written material to individual with discussion of infection control including proper hand washing and proper equipment cleaning during exercise session. Flowsheet Row Cardiac Rehab from 04/10/2016 in [facility] Cardiac and Pulmonary Rehab  Date  02/07/16  Educator  D. Joya Gaskins, RN  Instruction Review Code  2- meets goals/outcomes      Falls Prevention: - Provides verbal and written material to individual with discussion of falls prevention and safety. Flowsheet Row Cardiac Rehab from 04/10/2016 in [facility] Cardiac and Pulmonary Rehab  Date  02/07/16  Educator  D. Joya Gaskins, RN  Instruction Review Code  1- partially meets, needs review/practice      Diabetes: - Individual verbal and written instruction to review signs/symptoms of diabetes, desired ranges of glucose level fasting, after meals and with exercise. Advice that pre and post exercise glucose checks will be done for 3 sessions at entry of program. [place] from 04/10/2016 in [facility] Cardiac and Pulmonary Rehab  Date  02/07/16  Educator  D.Joya Gaskins, RN  Instruction Review Code  2- meets goals/outcomes       Knowledge Questionnaire Score:     Knowledge Questionnaire Score - 04/05/16 0953      Knowledge Questionnaire Score   Pre Score 22/28   Post Score 25/28      Core Components/Risk Factors/Patient Goals at Admission:     Personal Goals and Risk Factors at Admission - 02/07/16 1515      Core Components/Risk Factors/Patient Goals on Admission    Weight Management Weight Maintenance   Intervention Weight Management: Provide education and appropriate resources to help participant work on and attain dietary goals.   Admit Weight 227 lb (103 kg)   Goal Weight: Short Term 224 lb (101.6 kg)   Goal Weight: Long Term 224 lb (101.6 kg)   Expected Outcomes Short Term: Continue to assess and  modify interventions until short term weight is achieved;Weight Maintenance: Understanding of the daily nutrition guidelines, which includes 25-35% calories from fat, 7% or less cal from saturated fats, less than 274m cholesterol, less than 1.5gm of sodium, & 5 or more servings of fruits and vegetables daily;Long Term: Adherence to nutrition and physical activity/exercise program aimed toward attainment of established weight goal   Sedentary Yes   Intervention Provide advice, education, support and counseling about physical activity/exercise needs.;Develop an individualized exercise prescription for aerobic and resistive training based on initial evaluation findings, risk stratification, comorbidities and participant's personal goals.   Expected Outcomes Achievement of increased cardiorespiratory fitness and enhanced flexibility, muscular endurance and strength shown through measurements of functional capacity and personal statement of participant.   Increase Strength and Stamina Yes   Intervention Provide advice, education, support and counseling about physical activity/exercise needs.;Develop an individualized exercise prescription for aerobic and resistive training based on initial evaluation findings, risk stratification, comorbidities and participant's personal goals.   Expected Outcomes Achievement of increased cardiorespiratory fitness and enhanced flexibility, muscular endurance and strength shown through measurements of functional capacity and personal statement of participant.   Diabetes Yes   Intervention Provide education about signs/symptoms and action to take for hypo/hyperglycemia.;Provide education about proper nutrition, including hydration, and  aerobic/resistive exercise prescription along with prescribed medications to achieve blood glucose in normal ranges: Fasting glucose 65-99 mg/dL   Expected Outcomes Short Term: Participant verbalizes understanding of the signs/symptoms and  immediate care of hyper/hypoglycemia, proper foot care and importance of medication, aerobic/resistive exercise and nutrition plan for blood glucose control.;Long Term: Attainment of HbA1C < 7%.   Hypertension Yes   Intervention Provide education on lifestyle modifcations including regular physical activity/exercise, weight management, moderate sodium restriction and increased consumption of fresh fruit, vegetables, and low fat dairy, alcohol moderation, and smoking cessation.;Monitor prescription use compliance.   Expected Outcomes Short Term: Continued assessment and intervention until BP is < 140/29m HG in hypertensive participants. < 130/855mHG in hypertensive participants with diabetes, heart failure or chronic kidney disease.;Long Term: Maintenance of blood pressure at goal levels.   Lipids Yes   Intervention Provide education and support for participant on nutrition & aerobic/resistive exercise along with prescribed medications to achieve LDL <7027mHDL >13m13m Expected Outcomes Short Term: Participant states understanding of desired cholesterol values and is compliant with medications prescribed. Participant is following exercise prescription and nutrition guidelines.;Long Term: Cholesterol controlled with medications as prescribed, with individualized exercise RX and with personalized nutrition plan. Value goals: LDL < 70mg7mL > 40 mg.   Stress Yes   Intervention Offer individual and/or small group education and counseling on adjustment to heart disease, stress management and health-related lifestyle change. Teach and support self-help strategies.;Refer participants experiencing significant psychosocial distress to appropriate mental health specialists for further evaluation and treatment. When possible, include family members and significant others in education/counseling sessions.   Expected Outcomes Short Term: Participant demonstrates changes in health-related behavior, relaxation and  other stress management skills, ability to obtain effective social support, and compliance with psychotropic medications if prescribed.;Long Term: Emotional wellbeing is indicated by absence of clinically significant psychosocial distress or social isolation.      Core Components/Risk Factors/Patient Goals Review:      Goals and Risk Factor Review    Row Name 02/14/16 1418 03/01/16 1025 03/22/16 0951         Core Components/Risk Factors/Patient Goals Review   Personal Goals Review Weight Management/Obesity;Sedentary;Increase Strength and Stamina;Diabetes;Hypertension;Lipids;Stress Weight Management/Obesity;Sedentary;Increase Strength and Stamina;Diabetes;Hypertension;Lipids Weight Management/Obesity;Sedentary;Increase Strength and Stamina;Diabetes;Hypertension;Lipids     Review [facility].  His weight, blood pressures, and blood sugars have all been stable. Today his fasting CBG was 137.  He has had no problems with his statin medication.  He is doing better with his stress levels as well. Manual's weight has been fairly steady during the program.  His blood pressure are now running in the 110-120s/60s at rest.  He does not check his blod sugar at home, but has not noticed any highs or lows.  He had labs drawn on Monday for an appt next week.  He is not currently exercisng at home. DavidMayrabeen attending classes regularly and walking at home.  He says that his blood pressures and blood sugars have been good, although he admits to not checking them at home.  He has not had any issues with taking his statin.  However, he does note tat when he stands up quickly, he continues to have dizzy spells.  We talked about orthostatic pressures and the importance of staying hydrated and getting up slowly.     Expected Outcomes DavidFinlee continue to come to exercise and education classes.  We will monitor blood pressure and continue to  work with him on exercise and stress. [place] will continue to come to exercise and education classes.  We will discuss home exercise plans soon.   We will continue to monitor Nikolaj in the program, keeping a close eye on his blood pressure.  He will continue to attend education and exercise classes.        Core Components/Risk Factors/Patient Goals at Discharge (Final Review):      Goals and Risk Factor Review - 03/22/16 0951      Core Components/Risk Factors/Patient Goals Review   Personal Goals Review Weight Management/Obesity;Sedentary;Increase Strength and Stamina;Diabetes;Hypertension;Lipids   Review Yafet has been attending classes regularly and walking at home.  He says that his blood pressures and blood sugars have been good, although he admits to not checking them at home.  He has not had any issues with taking his statin.  However, he does note tat when he stands up quickly, he continues to have dizzy spells.  We talked about orthostatic pressures and the importance of staying hydrated and getting up slowly.   Expected Outcomes We will continue to monitor Dahl in the program, keeping a close eye on his blood pressure.  He will continue to attend education and exercise classes.      ITP Comments:     ITP Comments    Row Name 02/16/16 0757 03/15/16 0801 04/07/16 0854 04/12/16 0632     ITP Comments 30 day review. Continue with ITP 30 day review. Continue with ITP unless changes noted by Medical Director at signature of review. Andreas reports that his doctor's office called him and told him to hold his Metoprolol since his heart rate in Cardiac Rehab at times  was discovered to be in the low 40's. Heart rate today was 76 upo arrival to exercise. 30 day review. Continue with ITP unless changes noted by Medical Director at signature of review.       Comments: Discharge ITP

## 2016-06-30 ENCOUNTER — Telehealth (HOSPITAL_COMMUNITY): Payer: Self-pay

## 2016-06-30 NOTE — Telephone Encounter (Signed)
Called to schedule f/u US carotid. Pt stated that he wants to talk with wife first. He is getting ultrasounds done in 2 different places. Will call back to schedule. AW

## 2016-09-11 ENCOUNTER — Emergency Department: Payer: Medicare HMO | Admitting: Registered Nurse

## 2016-09-11 ENCOUNTER — Encounter
Admission: EM | Disposition: A | Discharge status: Home / Self Care | Payer: Self-pay | Source: Home / Self Care | Attending: Emergency Medicine

## 2016-09-11 ENCOUNTER — Emergency Department
Admission: EM | Admit: 2016-09-11 | Discharge: 2016-09-11 | Disposition: A | Discharge status: Home / Self Care | Payer: Medicare HMO | Attending: Emergency Medicine | Admitting: Emergency Medicine

## 2016-09-11 ENCOUNTER — Encounter: Payer: Self-pay | Admitting: Emergency Medicine

## 2016-09-11 ENCOUNTER — Ambulatory Visit: Admit: 2016-09-11 | Payer: Medicare HMO | Admitting: Gastroenterology

## 2016-09-11 ENCOUNTER — Emergency Department: Payer: Medicare HMO

## 2016-09-11 ENCOUNTER — Ambulatory Visit
Admission: EM | Admit: 2016-09-11 | Discharge: 2016-09-11 | Disposition: A | Discharge status: Home / Self Care | Payer: Medicare HMO | Attending: Emergency Medicine | Admitting: Emergency Medicine

## 2016-09-11 DIAGNOSIS — Z79899 Other long term (current) drug therapy: Secondary | ICD-10-CM | POA: Insufficient documentation

## 2016-09-11 DIAGNOSIS — Z951 Presence of aortocoronary bypass graft: Secondary | ICD-10-CM | POA: Diagnosis not present

## 2016-09-11 DIAGNOSIS — J449 Chronic obstructive pulmonary disease, unspecified: Secondary | ICD-10-CM | POA: Diagnosis not present

## 2016-09-11 DIAGNOSIS — R1310 Dysphagia, unspecified: Secondary | ICD-10-CM

## 2016-09-11 DIAGNOSIS — Z7984 Long term (current) use of oral hypoglycemic drugs: Secondary | ICD-10-CM | POA: Insufficient documentation

## 2016-09-11 DIAGNOSIS — R066 Hiccough: Secondary | ICD-10-CM

## 2016-09-11 DIAGNOSIS — T18128A Food in esophagus causing other injury, initial encounter: Secondary | ICD-10-CM

## 2016-09-11 DIAGNOSIS — I2510 Atherosclerotic heart disease of native coronary artery without angina pectoris: Secondary | ICD-10-CM | POA: Insufficient documentation

## 2016-09-11 DIAGNOSIS — T18120A Food in esophagus causing compression of trachea, initial encounter: Secondary | ICD-10-CM | POA: Insufficient documentation

## 2016-09-11 DIAGNOSIS — I10 Essential (primary) hypertension: Secondary | ICD-10-CM | POA: Insufficient documentation

## 2016-09-11 DIAGNOSIS — Y929 Unspecified place or not applicable: Secondary | ICD-10-CM | POA: Diagnosis not present

## 2016-09-11 DIAGNOSIS — E039 Hypothyroidism, unspecified: Secondary | ICD-10-CM | POA: Diagnosis not present

## 2016-09-11 DIAGNOSIS — Y999 Unspecified external cause status: Secondary | ICD-10-CM | POA: Diagnosis not present

## 2016-09-11 DIAGNOSIS — Y939 Activity, unspecified: Secondary | ICD-10-CM | POA: Diagnosis not present

## 2016-09-11 DIAGNOSIS — R1111 Vomiting without nausea: Secondary | ICD-10-CM

## 2016-09-11 DIAGNOSIS — Z87891 Personal history of nicotine dependence: Secondary | ICD-10-CM | POA: Diagnosis not present

## 2016-09-11 DIAGNOSIS — Z7982 Long term (current) use of aspirin: Secondary | ICD-10-CM | POA: Insufficient documentation

## 2016-09-11 DIAGNOSIS — K222 Esophageal obstruction: Secondary | ICD-10-CM

## 2016-09-11 DIAGNOSIS — X58XXXA Exposure to other specified factors, initial encounter: Secondary | ICD-10-CM | POA: Diagnosis not present

## 2016-09-11 HISTORY — PX: ESOPHAGOGASTRODUODENOSCOPY (EGD) WITH PROPOFOL: SHX5813

## 2016-09-11 LAB — GLUCOSE, CAPILLARY
GLUCOSE-CAPILLARY: 135 mg/dL — AB (ref 65–99)
Glucose-Capillary: 136 mg/dL — ABNORMAL HIGH (ref 65–99)

## 2016-09-11 SURGERY — ESOPHAGOGASTRODUODENOSCOPY (EGD) WITH PROPOFOL
Anesthesia: General

## 2016-09-11 MED ORDER — SUCCINYLCHOLINE CHLORIDE 200 MG/10ML IV SOSY
PREFILLED_SYRINGE | INTRAVENOUS | Status: AC
  Filled 2016-09-11: qty 10

## 2016-09-11 MED ORDER — LIDOCAINE HCL (PF) 2 % IJ SOLN
INTRAMUSCULAR | Status: AC
  Filled 2016-09-11: qty 2

## 2016-09-11 MED ORDER — SODIUM CHLORIDE 0.9 % IV SOLN
INTRAVENOUS | Status: DC
Start: 2016-09-11 — End: 2016-10-17
  Administered 2016-09-11: 12:00:00 via INTRAVENOUS

## 2016-09-11 MED ORDER — DEXAMETHASONE SODIUM PHOSPHATE 10 MG/ML IJ SOLN
INTRAMUSCULAR | Status: AC
  Filled 2016-09-11: qty 1

## 2016-09-11 MED ORDER — ONDANSETRON HCL 4 MG/2ML IJ SOLN
INTRAMUSCULAR | Status: AC
  Filled 2016-09-11: qty 2

## 2016-09-11 MED ORDER — SUCCINYLCHOLINE CHLORIDE 20 MG/ML IJ SOLN
INTRAMUSCULAR | Status: DC | PRN
  Administered 2016-09-11: 150 mg via INTRAVENOUS

## 2016-09-11 MED ORDER — FENTANYL CITRATE (PF) 100 MCG/2ML IJ SOLN
INTRAMUSCULAR | Status: AC
  Filled 2016-09-11: qty 2

## 2016-09-11 MED ORDER — MIDAZOLAM HCL 2 MG/2ML IJ SOLN
INTRAMUSCULAR | Status: DC | PRN
  Administered 2016-09-11: 2 mg via INTRAVENOUS

## 2016-09-11 MED ORDER — PROPOFOL 10 MG/ML IV BOLUS
INTRAVENOUS | Status: AC
  Filled 2016-09-11: qty 20

## 2016-09-11 MED ORDER — LIDOCAINE HCL (CARDIAC) 20 MG/ML IV SOLN
INTRAVENOUS | Status: DC | PRN
Start: 2016-09-11 — End: 2016-09-11
  Administered 2016-09-11: 100 mg via INTRAVENOUS

## 2016-09-11 MED ORDER — MIDAZOLAM HCL 2 MG/2ML IJ SOLN
INTRAMUSCULAR | Status: AC
  Filled 2016-09-11: qty 2

## 2016-09-11 MED ORDER — PROPOFOL 10 MG/ML IV BOLUS
INTRAVENOUS | Status: DC | PRN
  Administered 2016-09-11: 150 mg via INTRAVENOUS

## 2016-09-11 MED ORDER — SODIUM CHLORIDE 0.9 % IV BOLUS (SEPSIS)
1000.0000 mL | Freq: Once | INTRAVENOUS | Status: AC
  Administered 2016-09-11: 1000 mL via INTRAVENOUS

## 2016-09-11 MED ORDER — FENTANYL CITRATE (PF) 100 MCG/2ML IJ SOLN
INTRAMUSCULAR | Status: DC | PRN
  Administered 2016-09-11: 100 ug via INTRAVENOUS

## 2016-09-11 MED ORDER — ONDANSETRON HCL 4 MG/2ML IJ SOLN
INTRAMUSCULAR | Status: DC | PRN
  Administered 2016-09-11: 4 mg via INTRAVENOUS

## 2016-09-11 MED ORDER — DEXAMETHASONE SODIUM PHOSPHATE 10 MG/ML IJ SOLN
INTRAMUSCULAR | Status: DC | PRN
  Administered 2016-09-11: 10 mg via INTRAVENOUS

## 2016-09-11 NOTE — Discharge Instructions (Signed)
I am concerned that you have an food bolus stuck in your lower esophagus.

## 2016-09-11 NOTE — Anesthesia Postprocedure Evaluation (Signed)
Anesthesia Post Note  Patient: Paul Bass  Procedure(s) Performed: Procedure(s) (LRB): ESOPHAGOGASTRODUODENOSCOPY (EGD) WITH PROPOFOL (N/A)  Patient location during evaluation: PACU Anesthesia Type: General Level of consciousness: awake and alert and oriented Pain management: pain level controlled Vital Signs Assessment: post-procedure vital signs reviewed and stable Respiratory status: spontaneous breathing, nonlabored ventilation and respiratory function stable Cardiovascular status: blood pressure returned to baseline and stable Postop Assessment: no signs of nausea or vomiting Anesthetic complications: no     Last Vitals:  Vitals:   09/11/16 1217 09/11/16 1227  BP: 122/62 123/74  Pulse: 76 74  Resp: 18 (!) 25  Temp:  36.4 C    Last Pain:  Vitals:   09/11/16 1217  TempSrc:   PainSc: 0-No pain                 Brunella Wileman

## 2016-09-11 NOTE — H&P (Signed)
Paul Lame, MD [facility] [address], [place], [place] [address] Phone: [phone] Fax : [phone]  Primary Care Physician:  Paul Lange, NP Primary Gastroenterologist:  Dr. Allen Bass  Pre-Procedure History & Physical: HPI:  Paul Bass is a 71 y.o. male is here for an endoscopy.   Past Medical History:  Diagnosis Date  . Anxiety   . BPH (benign prostatic hypertrophy)   . CAD S/P percutaneous coronary angioplasty   . COPD (chronic obstructive pulmonary disease) ([place])   . Depression   . HTN (hypertension)   . Hyperlipidemia   . Hypothyroidism   . Stroke ([place])   . Tobacco abuse     Past Surgical History:  Procedure Laterality Date  . CARDIAC CATHETERIZATION N/A 12/07/2015   Procedure: Left Heart Cath and Coronary Angiography;  Surgeon: Corey Skains, MD;  Location: [facility] CV LAB;  Service: Cardiovascular;  Laterality: N/A;  . 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 pins R lower leg    . rotator cuff replaced     right    Prior to Admission medications   Medication Sig Start Date End Date Taking? Authorizing Provider  citalopram (CELEXA) 20 MG tablet Take 20 mg by mouth daily.   Yes Historical Provider, MD  clopidogrel (PLAVIX) 75 MG tablet TAKE 1 TABLET (75 MG TOTAL) BY MOUTH ONCE DAILY. 12/21/15  Yes Historical Provider, MD  levothyroxine (SYNTHROID, LEVOTHROID) 100 MCG tablet Take 100 mcg by mouth daily before breakfast.   Yes Historical Provider, MD  losartan (COZAAR) 25 MG tablet Take by mouth.   Yes Historical Provider, MD  Omega-3 Fatty Acids (FISH OIL) 1200 MG CAPS Take 1,000 mg by mouth 2 (two) times daily.   Yes Historical Provider, MD  albuterol (PROVENTIL HFA;VENTOLIN HFA) 108 (90 BASE) MCG/ACT inhaler Inhale 2 puffs into the lungs every 6 (six) hours as needed for wheezing or shortness of breath. For shortness of breath    Historical Provider, MD  aspirin EC 81 MG tablet Take 81 mg by mouth daily.     Historical Provider, MD  atorvastatin (LIPITOR) 80 MG tablet Take 80 mg by mouth. 01/09/16   Historical Provider, MD  budesonide-formoterol (SYMBICORT) 160-4.5 MCG/ACT inhaler Inhale 2 puffs into the lungs 2 (two) times daily.    Historical Provider, MD  cholecalciferol (VITAMIN D) 400 UNITS TABS Take 400 Units by mouth 2 (two) times daily.    Historical Provider, MD  clonazePAM (KLONOPIN) 0.5 MG tablet Take 0.5 mg by mouth 2 (two) times daily as needed for anxiety.    Historical Provider, MD  Coenzyme Q10 (CO Q-10) 200 MG CAPS Take 1 capsule by mouth daily.    Historical Provider, MD  glucose blood test strip     Historical Provider, MD  metFORMIN (GLUCOPHAGE) 500 MG tablet TAKE 1 TABLET BY MOUTH ONCE A DAY 09/07/14   Historical Provider, MD  Multiple Vitamin (MULTIVITAMIN WITH MINERALS) TABS Take 0.5 tablets by mouth 2 (two) times daily.    Historical Provider, MD  PSYLLIUM PO Take by mouth.    Historical Provider, MD  ranitidine (ZANTAC) 300 MG tablet Take 300 mg by mouth at bedtime.    Historical Provider, MD  tamsulosin (FLOMAX) 0.4 MG CAPS capsule Take 0.4 mg by mouth daily.  06/16/14   Historical Provider, MD    Allergies as of 09/11/2016  . (No Known Allergies)    Family History  Problem Relation Age of  Onset  . Heart failure Father   . Throat cancer Mother   . Diabetes Brother     Social History   Social History  . Marital status: Married    Spouse name: Vaughan Basta  . Number of children: 1  . Years of education: HS   Occupational History  . Retired     Part Time [employer]   Social History Main Topics  . Smoking status: Former Smoker    Packs/day: 2.00    Types: Cigarettes    Quit date: 05/20/2012  . Smokeless tobacco: Never Used  . Alcohol use No     Comment: weekend drinker, had 5 beers on 05/11/12, prior to stroke, No alcohol since stroke  . Drug use: Unknown  . Sexual activity: Not on file   Other Topics Concern  . Not on file   Social History Narrative    Patient lives at home with his wife works at  [employer] has a 12 grade education with 1 child.   Patient quit smoking 05-11-2012 patient drinks alcoholic drinks he also drinks cafinted drinks daily.    Review of Systems: See HPI, otherwise negative ROS  Physical Exam: BP 133/78   Pulse (!) 57   Temp 98.6 F (37 C) (Oral)   Resp 18   Ht 5\' 11"  (1.803 m)   Wt 225 lb (102.1 kg)   SpO2 95%   BMI 31.38 kg/m  General:   Alert,  pleasant and cooperative in NAD Head:  Normocephalic and atraumatic. Neck:  Supple; no masses or thyromegaly. Lungs:  Clear throughout to auscultation.    Heart:  Regular rate and rhythm. Abdomen:  Soft, nontender and nondistended. Normal bowel sounds, without guarding, and without rebound.   Neurologic:  Alert and  oriented x4;  grossly normal neurologically.  Impression/Plan: Paul Bass is here for an endoscopy to be performed for food bolus  Risks, benefits, limitations, and alternatives regarding  endoscopy have been reviewed with the patient.  Questions have been answered.  All parties agreeable.   Paul Lame, MD  09/11/2016, 11:46 AM

## 2016-09-11 NOTE — ED Notes (Signed)
Patient transported to X-ray 

## 2016-09-11 NOTE — ED Triage Notes (Signed)
States unable to swallow since eating steak yesterday. States cannot swallow water but states can swallow own saliva.

## 2016-09-11 NOTE — Anesthesia Preprocedure Evaluation (Signed)
Anesthesia Evaluation  Patient identified by MRN, date of birth, ID band Patient awake    Reviewed: Allergy & Precautions, NPO status , Patient's Chart, lab work & pertinent test results  History of Anesthesia Complications Negative for: history of anesthetic complications  Airway Mallampati: II  TM Distance: >3 FB Neck ROM: Full    Dental  (+) Upper Dentures   Pulmonary COPD,  COPD inhaler, former smoker,    breath sounds clear to auscultation- rhonchi (-) wheezing      Cardiovascular hypertension, (-) angina+ CAD and + CABG (June 2017)   Rhythm:Regular Rate:Normal - Systolic murmurs and - Diastolic murmurs    Neuro/Psych PSYCHIATRIC DISORDERS Anxiety Depression CVA    GI/Hepatic negative GI ROS, Neg liver ROS,   Endo/Other  neg diabetesHypothyroidism   Renal/GU negative Renal ROS     Musculoskeletal negative musculoskeletal ROS (+)   Abdominal (+) + obese,   Peds  Hematology negative hematology ROS (+)   Anesthesia Other Findings Past Medical History: No date: Anxiety No date: BPH (benign prostatic hypertrophy) No date: CAD S/P percutaneous coronary angioplasty No date: COPD (chronic obstructive pulmonary disease) (* No date: Depression No date: HTN (hypertension) No date: Hyperlipidemia No date: Hypothyroidism No date: Stroke [facility]) No date: Tobacco abuse   Reproductive/Obstetrics                             Anesthesia Physical Anesthesia Plan  ASA: III  Anesthesia Plan: General   Post-op Pain Management:    Induction: Intravenous, Rapid sequence and Cricoid pressure planned  Airway Management Planned: Oral ETT  Additional Equipment:   Intra-op Plan:   Post-operative Plan: Extubation in OR  Informed Consent: I have reviewed the patients History and Physical, chart, labs and discussed the procedure including the risks, benefits and alternatives for the proposed  anesthesia with the patient or authorized representative who has indicated his/her understanding and acceptance.   Dental advisory given  Plan Discussed with: CRNA and Anesthesiologist  Anesthesia Plan Comments:         Anesthesia Quick Evaluation

## 2016-09-11 NOTE — ED Provider Notes (Signed)
HPI  SUBJECTIVE:  Paul Bass is a 71 y.o. male who presents with hiccups whenever he tries to eat or drink anything. He reports vomiting/regurgitation of whitish material. States he is unable tolerate any by mouth at all. Symptoms started yesterday after eating too large bites of steak at 1500. Patient states he had 7 hours of hiccups straight with this vomiting. He has had similar symptoms before intermittently since his heart surgery when he eats too fast, but usually resolves on its own. He states the hiccups are now present only when he eats and drinks. He reports decreased urine output, states he has urinated only once today. Normally has urinated 3-4 times by now and he is concerned that he is dehydrated. He states that he is hungry and very thirsty. States he feels overall weak. Reports drooling while hiccuping. No presyncope, syncope, coughing, wheezing, chest pain, shortness of breath, abdominal pain, distention. Symptoms are worse with any by mouth intake, no alleviating factors. He has not tried anything for this.   he has an extensive medical history including ischemic stroke, hypertension, hypothyroidism, coronary artery disease status post cardiac catheterization and stent placement, status post CABG, hiatal hernia, diabetes, COPD, GERD. He does not have any history of esophageal strictures. He is currently on Plavix. PCP: [name], NP     Past Medical History:  Diagnosis Date  . Anxiety   . BPH (benign prostatic hypertrophy)   . CAD S/P percutaneous coronary angioplasty   . COPD (chronic obstructive pulmonary disease) ([place])   . Depression   . HTN (hypertension)   . Hyperlipidemia   . Hypothyroidism   . Stroke ([place])   . Tobacco abuse     Past Surgical History:  Procedure Laterality Date  . CARDIAC CATHETERIZATION N/A 12/07/2015   Procedure: Left Heart Cath and Coronary Angiography;  Surgeon: Corey Skains, MD;  Location: [facility];  Service:  Cardiovascular;  Laterality: N/A;  . 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 pins R lower leg    . rotator cuff replaced     right    Family History  Problem Relation Age of Onset  . Heart failure Father   . Throat cancer Mother   . Diabetes Brother     Social History  Substance Use Topics  . Smoking status: Former Smoker    Packs/day: 2.00    Types: Cigarettes    Quit date: 05/20/2012  . Smokeless tobacco: Never Used  . Alcohol use No     Comment: weekend drinker, had 5 beers on 05/11/12, prior to stroke, No alcohol since stroke    No current facility-administered medications for this encounter.   Current Outpatient Prescriptions:  .  ranitidine (ZANTAC) 300 MG tablet, Take 300 mg by mouth at bedtime., Disp: , Rfl:  .  albuterol (PROVENTIL HFA;VENTOLIN HFA) 108 (90 BASE) MCG/ACT inhaler, Inhale 2 puffs into the lungs every 6 (six) hours as needed for wheezing or shortness of breath. For shortness of breath, Disp: , Rfl:  .  aspirin EC 81 MG tablet, Take 81 mg by mouth daily., Disp: , Rfl:  .  atorvastatin (LIPITOR) 80 MG tablet, Take 80 mg by mouth., Disp: , Rfl:  .  budesonide-formoterol (SYMBICORT) 160-4.5 MCG/ACT inhaler, Inhale 2 puffs into the lungs 2 (two) times daily., Disp: , Rfl:  .  cholecalciferol (VITAMIN D) 400 UNITS TABS, Take 400 Units by mouth 2 (two) times daily.,  Disp: , Rfl:  .  citalopram (CELEXA) 20 MG tablet, Take 20 mg by mouth daily., Disp: , Rfl:  .  clonazePAM (KLONOPIN) 0.5 MG tablet, Take 0.5 mg by mouth 2 (two) times daily as needed for anxiety., Disp: , Rfl:  .  clopidogrel (PLAVIX) 75 MG tablet, TAKE 1 TABLET (75 MG TOTAL) BY MOUTH ONCE DAILY., Disp: , Rfl: 1 .  Coenzyme Q10 (CO Q-10) 200 MG CAPS, Take 1 capsule by mouth daily., Disp: , Rfl:  .  glucose blood test strip, , Disp: , Rfl:  .  levothyroxine (SYNTHROID, LEVOTHROID) 100 MCG tablet, Take 100 mcg by mouth daily before breakfast., Disp: , Rfl:  .   losartan (COZAAR) 25 MG tablet, Take by mouth., Disp: , Rfl:  .  metFORMIN (GLUCOPHAGE) 500 MG tablet, TAKE 1 TABLET BY MOUTH ONCE A DAY, Disp: , Rfl:  .  Multiple Vitamin (MULTIVITAMIN WITH MINERALS) TABS, Take 0.5 tablets by mouth 2 (two) times daily., Disp: , Rfl:  .  Omega-3 Fatty Acids (FISH OIL) 1200 MG CAPS, Take 1,000 mg by mouth 2 (two) times daily., Disp: , Rfl:  .  PSYLLIUM PO, Take by mouth., Disp: , Rfl:  .  tamsulosin (FLOMAX) 0.4 MG CAPS capsule, Take 0.4 mg by mouth daily. , Disp: , Rfl:   No Known Allergies   ROS  As noted in HPI.   Physical Exam  BP 135/70 (BP Location: Left Arm)   Pulse 71   Temp 97.8 F (36.6 C) (Oral)   Resp 16   Ht 5\' 11"  (1.803 m)   Wt 225 lb (102.1 kg)   SpO2 98%   BMI 31.38 kg/m   Constitutional: Well developed, well nourished, no acute distress. No drooling, stridor  Eyes:  EOMI, conjunctiva normal bilaterally HENT: Normocephalic, atraumatic,mucus membranes moist.airway widely patent  Respiratory: Normal inspiratory effort Lungs clear bilaterally.  Cardiovascular: Normal rate Regular rhythm no murmurs rubs or gallops  GI: nondistended skin: No rash, skin intact Musculoskeletal: no deformities Neurologic: Alert & oriented x 3, no focal neuro deficits Psychiatric: Speech and behavior appropriate   ED Course   Medications - No data to display  No orders of the defined types were placed in this encounter.   No results found for this or any previous visit (from the past 24 hour(s)). No results found.  ED Clinical Impression  Hiccups  Food impaction of esophagus, initial encounter   ED Assessment/Plan  Suspect that his symptoms are from an impacted food bolus. Offered to give glucagon 1 mg IM with Zofran, but the patient will still require an endoscopy to ensure resolution of the bolus. They have opted to go to the [facility] ED for comprehensive workup and treatment. They agree to go immediately to the ED. Feel the patient  is stable to go by private vehicle his vitals are normal he is in no respiratory distress.  notified the department.   Meds ordered this encounter  Medications  . ranitidine (ZANTAC) 300 MG tablet    Sig: Take 300 mg by mouth at bedtime.    *This clinic note was created using Dragon dictation software. Therefore, there may be occasional mistakes despite careful proofreading.  ?   Melynda Ripple, MD 09/11/16 0930

## 2016-09-11 NOTE — Op Note (Signed)
[facility]astroenterology Patient Name: Paul Bass Procedure Date: 09/11/2016 11:43 AM MRN: [id number] Account #: [id number] Date of Birth: 25-Apr-1946 Admit Type: Outpatient Age: 71 Room: [facility] ENDO ROOM 4 Gender: Male Note Status: Finalized Procedure:            Upper GI endoscopy Indications:          Dysphagia Providers:            Lucilla Lame MD, MD Referring MD:         No Local Md, MD (Referring MD) Medicines:            General Anesthesia Procedure:            Pre-Anesthesia Assessment:                       - Prior to the procedure, a History and Physical was                        performed, and patient medications and allergies were                        reviewed. The patient's tolerance of previous                        anesthesia was also reviewed. The risks and benefits of                        the procedure and the sedation options and risks were                        discussed with the patient. All questions were                        answered, and informed consent was obtained. Prior                        Anticoagulants: The patient has taken no previous                        anticoagulant or antiplatelet agents. ASA Grade                        Assessment: II - A patient with mild systemic disease.                        After reviewing the risks and benefits, the patient was                        deemed in satisfactory condition to undergo the                        procedure.                       After obtaining informed consent, the endoscope was                        passed under direct vision. Throughout the procedure,                        the patient's  blood pressure, pulse, and oxygen                        saturations were monitored continuously. The                        Colonoscope was introduced through the mouth, and                        advanced to the second part of duodenum. The upper GI   endoscopy was accomplished without difficulty. The                        patient tolerated the procedure well. Findings:      One severe benign-appearing, intrinsic stenosis was found at the       gastroesophageal junction. And was traversed.      Food was found at the gastroesophageal junction. Removal of food was       accomplished.      The stomach was normal.      The examined duodenum was normal. Impression:           - Benign-appearing esophageal stenosis.                       - Food at the gastroesophageal junction. Removal was                        successful.                       - Normal stomach.                       - Normal examined duodenum. Recommendation:       - Repeat upper endoscopy in 3 weeks for retreatment. Procedure Code(s):    --- Professional ---                       [phone], Esophagogastroduodenoscopy, flexible, transoral;                        with removal of foreign body(s) Diagnosis Code(s):    --- Professional ---                       R13.10, Dysphagia, unspecified                       [id number], Food in esophagus causing other injury,                        initial encounter                       K22.2, Esophageal obstruction CPT copyright 2016 American Medical Association. All rights reserved. The codes documented in this report are preliminary and upon coder review may  be revised to meet current compliance requirements. Lucilla Lame MD, MD 09/11/2016 11:59:12 AM This report has been signed electronically. Number of Addenda: 0 Note Initiated On: 09/11/2016 11:43 AM      [facility]

## 2016-09-11 NOTE — Anesthesia Procedure Notes (Signed)
Procedure Name: Intubation Date/Time: 09/11/2016 11:48 AM Performed by: Doreen Salvage Pre-anesthesia Checklist: Patient identified, Patient being monitored, Timeout performed, Emergency Drugs available and Suction available Patient Re-evaluated:Patient Re-evaluated prior to inductionOxygen Delivery Method: Circle system utilized Preoxygenation: Pre-oxygenation with 100% oxygen Intubation Type: IV induction, Rapid sequence and Cricoid Pressure applied Ventilation: Mask ventilation without difficulty Laryngoscope Size: Mac and 3 Grade View: Grade I Tube type: Oral Tube size: 7.5 mm Number of attempts: 1 Airway Equipment and Method: Stylet Placement Confirmation: ETT inserted through vocal cords under direct vision,  positive ETCO2 and breath sounds checked- equal and bilateral Secured at: 22 cm Tube secured with: Tape Dental Injury: Teeth and Oropharynx as per pre-operative assessment

## 2016-09-11 NOTE — ED Triage Notes (Signed)
Pt here from [place]; pt ate steak yesterday at 1500, has been vomiting since. [place] MD worried that steak is stuck.

## 2016-09-11 NOTE — ED Triage Notes (Signed)
Patient c/o vomiting that started since yesterday afternoon.  Patient reports having hiccups that started yesterday.

## 2016-09-11 NOTE — Anesthesia Post-op Follow-up Note (Cosign Needed)
Anesthesia QCDR form completed.        

## 2016-09-11 NOTE — ED Provider Notes (Signed)
[facility] Emergency Department Provider Note  ____________________________________________  Time seen: Approximately 10:48 AM  I have reviewed the triage vital signs and the nursing notes.   HISTORY  Chief Complaint Dysphagia    HPI Paul Bass is a 71 y.o. male with a former history of tobacco abuse and alcohol use, history of CAD status post CABG and angioplasty, COPD, HTN, CVA, presenting with vomiting and inability to eat or drink.. Patient reports that yesterday he ate some steak, and since then, he has been unable to tolerate any liquid or food, as it "comes back up" either slowly or with active vomiting. He does not have any pain or foreign body sensation.No fevers or chills. He has never had endoscopy in the past. He reports that over the last 8 months, he has had 4-5 episodes that were similar, but went away on their own. No weight loss.  SH: + tobacco and ETOH remotely [place]: No FH of esophageal cancer   Past Medical History:  Diagnosis Date  . Anxiety   . BPH (benign prostatic hypertrophy)   . CAD S/P percutaneous coronary angioplasty   . COPD (chronic obstructive pulmonary disease) ([place])   . Depression   . HTN (hypertension)   . Hyperlipidemia   . Hypothyroidism   . Stroke ([place])   . Tobacco abuse     Patient Active Problem List   Diagnosis Date Noted  . Problems with swallowing and mastication   . Food impaction of esophagus   . H/O coronary artery bypass surgery 01/17/2016  . 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 Snoring 01/22/2013  . Sleepiness 01/22/2013  . Dysphagia, unspecified(787.20) 01/22/2013  . Occlusion and stenosis of carotid artery without mention of cerebral infarction 01/22/2013  . Cerebral thrombosis with cerebral infarction ([place]) 01/22/2013  . Dysphagia 05/14/2012  . Global aphasia 05/14/2012  . Stroke, acute, thrombotic 05/12/2012  . Symptomatic carotid artery stenosis 05/12/2012  . HTN (hypertension) 05/12/2012   . COPD (chronic obstructive pulmonary disease) ([place]) 05/12/2012  . Acute respiratory failure ([place]) 05/12/2012    Past Surgical History:  Procedure Laterality Date  . CARDIAC CATHETERIZATION N/A 12/07/2015   Procedure: Left Heart Cath and Coronary Angiography;  Surgeon: Corey Skains, MD;  Location: [facility];  Service: Cardiovascular;  Laterality: N/A;  . 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 pins R lower leg    . rotator cuff replaced     right      Allergies Patient has no known allergies.  Family History  Problem Relation Age of Onset  . Heart failure Father   . Throat cancer Mother   . Diabetes Brother     Social History Social History  Substance Use Topics  . Smoking status: Former Smoker    Packs/day: 2.00    Types: Cigarettes    Quit date: 05/20/2012  . Smokeless tobacco: Never Used  . Alcohol use No     Comment: weekend drinker, had 5 beers on 05/11/12, prior to stroke, No alcohol since stroke    Review of Systems Constitutional: No fever/chills.No lightheadedness or syncope. Eyes: No visual changes. ENT: No sore throat. No congestion or rhinorrhea. Cardiovascular: Denies chest pain. Denies palpitations. Respiratory: Denies shortness of breath.  No cough. Gastrointestinal: No abdominal pain.  No nausea, positive vomiting.  Inability to keep down food or drink. No diarrhea.  No constipation. Musculoskeletal: Negative for back pain. Skin: Negative for rash. Neurological:  Negative for headaches. No focal numbness, tingling or weakness.   10-point ROS otherwise negative.  ____________________________________________   PHYSICAL EXAM:  VITAL SIGNS: ED Triage Vitals  Enc Vitals Group     BP 09/11/16 1000 (!) 135/110     Pulse Rate 09/11/16 1000 70     Resp 09/11/16 1000 18     Temp 09/11/16 1000 98.6 F (37 C)     Temp Source 09/11/16 1000 Oral     SpO2 09/11/16 1000 97 %     Weight --       Height --      Head Circumference --      Peak Flow --      Pain Score 09/11/16 0950 0     Pain Loc --      Pain Edu? --      Excl. in [place]? --     Constitutional: Alert and oriented. Well appearing and in no acute distress. Answers questions appropriately. Eyes: Conjunctivae are normal.  EOMI. No scleral icterus. Head: Atraumatic. Nose: No congestion/rhinnorhea. Mouth/Throat: Mucous membranes are moist. Posterior pharynx without any foreign body. Neck: No stridor.  Supple.   Cardiovascular: Normal rate, regular rhythm. No murmurs, rubs or gallops.  Respiratory: Normal respiratory effort.  No accessory muscle use or retractions. Lungs CTAB.  No wheezes, rales or ronchi. Gastrointestinal: Soft, nontender and nondistended.  No guarding or rebound.  No peritoneal signs. Neurologic:  A&Ox3.  Speech is clear.  Face and smile are symmetric.  EOMI.  Moves all extremities well. Skin:  Skin is warm, dry and intact. No rash noted. Psychiatric: Mood and affect are normal. Speech and behavior are normal.  Normal judgement.  ____________________________________________   LABS (all labs ordered are listed, but only abnormal results are displayed)  Labs Reviewed  GLUCOSE, CAPILLARY - Abnormal; Notable for the following:       Result Value   Glucose-Capillary 135 (*)    All other components within normal limits  GLUCOSE, CAPILLARY - Abnormal; Notable for the following:    Glucose-Capillary 136 (*)    All other components within normal limits   ____________________________________________  EKG  ED ECG REPORT I, Eula Listen, the attending physician, personally viewed and interpreted this ECG.   Date: 09/11/2016  EKG Time: 1121  Rate: 71  Rhythm: normal sinus rhythm  Axis: normal  Intervals:Prolonged QTc  ST&T Change: no STEMI  ____________________________________________  RADIOLOGY  Dg Neck Soft Tissue  Result Date: 09/11/2016 CLINICAL DATA:  Vomiting. EXAM: NECK SOFT  TISSUES - 1+ VIEW COMPARISON:  None. FINDINGS: There is no evidence of retropharyngeal soft tissue swelling or epiglottic enlargement. The cervical airway is unremarkable. Left carotid stents are noted. Carotid artery calcifications are noted bilaterally. IMPRESSION: Epiglottis appears normal. No radiopaque foreign body is noted in the airway. Electronically Signed   By: Marijo Conception, M.D.   On: 09/11/2016 11:23   Dg Chest 2 View  Result Date: 09/11/2016 CLINICAL DATA:  Difficulty swallowing EXAM: CHEST  2 VIEW COMPARISON:  None FINDINGS: The heart size and mediastinal contours are within normal limits. Aortic atherosclerosis. Both lungs are clear. The visualized skeletal structures are unremarkable. IMPRESSION: No active cardiopulmonary disease. Electronically Signed   By: Kerby Moors M.D.   On: 09/11/2016 11:21    ____________________________________________   PROCEDURES  Procedure(s) performed: None  Procedures  Critical Care performed: No ____________________________________________   INITIAL IMPRESSION / ASSESSMENT AND PLAN / ED COURSE  Pertinent labs & imaging results that were available  during my care of the patient were reviewed by me and considered in my medical decision making (see chart for details).  71 y.o. male presenting with inability to eat or drink since eating steak yesterday. It is possible the patient has a food bolus or impaction, but I am also concerned that he may have a mass given that this is the fourth or fifth episode over the last 8 months. I have spoken with Dr. Verl Blalock, the gastroenterologist on-call, who will take the patient to the endoscopy suite for further evaluation. The patient will be made nothing by mouth, and be given intravenous fluids to prevent dehydration. His chest x-ray and soft tissue neck x-ray are still pending  ----------------------------------------- 12:48 PM on 09/11/2016 -----------------------------------------  We have heard  from the GI suite that the patient did in fact have a food bolus and is now tolerating by mouth and will be discharged from the endoscopy suite. ____________________________________________  FINAL CLINICAL IMPRESSION(S) / ED DIAGNOSES  Final diagnoses:  Esophageal obstruction due to food impaction  Intractable vomiting without nausea, unspecified vomiting type         NEW MEDICATIONS STARTED DURING THIS VISIT:  Current Discharge Medication List        Eula Listen, MD 09/11/16 1249

## 2016-09-11 NOTE — Transfer of Care (Signed)
Immediate Anesthesia Transfer of Care Note  Patient: Paul Bass  Procedure(s) Performed: Procedure(s): ESOPHAGOGASTRODUODENOSCOPY (EGD) WITH PROPOFOL (N/A)  Patient Location: PACU  Anesthesia Type:General  Level of Consciousness: sedated  Airway & Oxygen Therapy: Patient Spontanous Breathing and Patient connected to face mask oxygen  Post-op Assessment: Report given to RN and Post -op Vital signs reviewed and stable  Post vital signs: Reviewed and stable  Last Vitals:  Vitals:   09/11/16 1030 09/11/16 1207  BP: 133/78 128/72  Pulse: (!) 57 71  Resp: 18 16  Temp:  0000000 C    Complications: No apparent anesthesia complications

## 2016-09-12 ENCOUNTER — Encounter: Payer: Self-pay | Admitting: Gastroenterology

## 2016-10-03 ENCOUNTER — Other Ambulatory Visit: Payer: Self-pay

## 2016-10-03 DIAGNOSIS — R1310 Dysphagia, unspecified: Secondary | ICD-10-CM

## 2016-10-06 ENCOUNTER — Other Ambulatory Visit: Payer: Self-pay

## 2016-10-06 ENCOUNTER — Telehealth: Payer: Self-pay

## 2016-10-06 NOTE — Telephone Encounter (Signed)
Sent blood thinner request inbox.  Rescheduled EGD due to request.

## 2016-10-09 NOTE — Telephone Encounter (Signed)
Okay to stop Plavix 3 days prior to procedure and to resume it after the procedure when safe  with a small but acceptable peri-procedural risk of TIA/stroke if patient is willing to take

## 2016-10-11 ENCOUNTER — Telehealth: Payer: Self-pay

## 2016-10-11 NOTE — Telephone Encounter (Signed)
Left vm with pt's wife, Vaughan Basta on [phone] that per Dr. Leonie Man, pt can stop Plavix 3 days prior to procedure and restart one day after. Requested a return call so she can confirm this information.

## 2016-10-16 ENCOUNTER — Encounter: Payer: Self-pay | Admitting: *Deleted

## 2016-10-17 ENCOUNTER — Ambulatory Visit: Admission: RE | Admit: 2016-10-17 | Payer: Medicare HMO | Source: Ambulatory Visit | Admitting: Gastroenterology

## 2016-10-17 ENCOUNTER — Encounter: Payer: Self-pay | Admitting: *Deleted

## 2016-10-17 ENCOUNTER — Encounter
Admission: EM | Disposition: A | Discharge status: Home / Self Care | Payer: Self-pay | Source: Home / Self Care | Attending: Emergency Medicine

## 2016-10-17 ENCOUNTER — Encounter
Admission: RE | Disposition: A | Discharge status: Home / Self Care | Payer: Self-pay | Source: Ambulatory Visit | Attending: Gastroenterology

## 2016-10-17 ENCOUNTER — Ambulatory Visit: Payer: Medicare HMO | Admitting: Anesthesiology

## 2016-10-17 ENCOUNTER — Ambulatory Visit
Admission: RE | Admit: 2016-10-17 | Discharge: 2016-10-17 | Disposition: A | Discharge status: Home / Self Care | Payer: Medicare HMO | Source: Ambulatory Visit | Attending: Gastroenterology | Admitting: Gastroenterology

## 2016-10-17 DIAGNOSIS — Z833 Family history of diabetes mellitus: Secondary | ICD-10-CM | POA: Diagnosis not present

## 2016-10-17 DIAGNOSIS — Z7982 Long term (current) use of aspirin: Secondary | ICD-10-CM | POA: Insufficient documentation

## 2016-10-17 DIAGNOSIS — K219 Gastro-esophageal reflux disease without esophagitis: Secondary | ICD-10-CM | POA: Insufficient documentation

## 2016-10-17 DIAGNOSIS — E1151 Type 2 diabetes mellitus with diabetic peripheral angiopathy without gangrene: Secondary | ICD-10-CM | POA: Insufficient documentation

## 2016-10-17 DIAGNOSIS — N400 Enlarged prostate without lower urinary tract symptoms: Secondary | ICD-10-CM | POA: Diagnosis not present

## 2016-10-17 DIAGNOSIS — Z79899 Other long term (current) drug therapy: Secondary | ICD-10-CM | POA: Diagnosis not present

## 2016-10-17 DIAGNOSIS — K222 Esophageal obstruction: Secondary | ICD-10-CM

## 2016-10-17 DIAGNOSIS — J449 Chronic obstructive pulmonary disease, unspecified: Secondary | ICD-10-CM | POA: Diagnosis not present

## 2016-10-17 DIAGNOSIS — Z7951 Long term (current) use of inhaled steroids: Secondary | ICD-10-CM | POA: Insufficient documentation

## 2016-10-17 DIAGNOSIS — Z87891 Personal history of nicotine dependence: Secondary | ICD-10-CM | POA: Insufficient documentation

## 2016-10-17 DIAGNOSIS — F419 Anxiety disorder, unspecified: Secondary | ICD-10-CM | POA: Insufficient documentation

## 2016-10-17 DIAGNOSIS — R1310 Dysphagia, unspecified: Secondary | ICD-10-CM

## 2016-10-17 DIAGNOSIS — I10 Essential (primary) hypertension: Secondary | ICD-10-CM | POA: Insufficient documentation

## 2016-10-17 DIAGNOSIS — Z7984 Long term (current) use of oral hypoglycemic drugs: Secondary | ICD-10-CM | POA: Diagnosis not present

## 2016-10-17 DIAGNOSIS — E039 Hypothyroidism, unspecified: Secondary | ICD-10-CM | POA: Diagnosis not present

## 2016-10-17 DIAGNOSIS — Z800 Family history of malignant neoplasm of digestive organs: Secondary | ICD-10-CM | POA: Diagnosis not present

## 2016-10-17 DIAGNOSIS — I2510 Atherosclerotic heart disease of native coronary artery without angina pectoris: Secondary | ICD-10-CM | POA: Diagnosis not present

## 2016-10-17 DIAGNOSIS — F329 Major depressive disorder, single episode, unspecified: Secondary | ICD-10-CM | POA: Diagnosis not present

## 2016-10-17 DIAGNOSIS — Z8249 Family history of ischemic heart disease and other diseases of the circulatory system: Secondary | ICD-10-CM | POA: Diagnosis not present

## 2016-10-17 DIAGNOSIS — Z9889 Other specified postprocedural states: Secondary | ICD-10-CM | POA: Insufficient documentation

## 2016-10-17 DIAGNOSIS — I252 Old myocardial infarction: Secondary | ICD-10-CM | POA: Diagnosis not present

## 2016-10-17 DIAGNOSIS — Z955 Presence of coronary angioplasty implant and graft: Secondary | ICD-10-CM | POA: Insufficient documentation

## 2016-10-17 DIAGNOSIS — E785 Hyperlipidemia, unspecified: Secondary | ICD-10-CM | POA: Diagnosis not present

## 2016-10-17 DIAGNOSIS — Z8673 Personal history of transient ischemic attack (TIA), and cerebral infarction without residual deficits: Secondary | ICD-10-CM | POA: Insufficient documentation

## 2016-10-17 HISTORY — PX: SAVORY DILATION: SHX5439

## 2016-10-17 HISTORY — PX: ESOPHAGOGASTRODUODENOSCOPY (EGD) WITH PROPOFOL: SHX5813

## 2016-10-17 LAB — GLUCOSE, CAPILLARY: Glucose-Capillary: 155 mg/dL — ABNORMAL HIGH (ref 65–99)

## 2016-10-17 SURGERY — ESOPHAGOGASTRODUODENOSCOPY (EGD) WITH PROPOFOL
Anesthesia: General

## 2016-10-17 MED ORDER — PROPOFOL 10 MG/ML IV BOLUS
INTRAVENOUS | Status: DC | PRN
  Administered 2016-10-17: 20 mg via INTRAVENOUS
  Administered 2016-10-17: 70 mg via INTRAVENOUS
  Administered 2016-10-17: 30 mg via INTRAVENOUS

## 2016-10-17 MED ORDER — GLYCOPYRROLATE 0.2 MG/ML IJ SOLN
INTRAMUSCULAR | Status: AC
  Filled 2016-10-17: qty 1

## 2016-10-17 MED ORDER — PROPOFOL 10 MG/ML IV BOLUS
INTRAVENOUS | Status: AC
  Filled 2016-10-17: qty 20

## 2016-10-17 MED ORDER — PROPOFOL 500 MG/50ML IV EMUL
INTRAVENOUS | Status: AC
  Filled 2016-10-17: qty 50

## 2016-10-17 MED ORDER — SODIUM CHLORIDE 0.9 % IV SOLN
INTRAVENOUS | Status: DC
  Administered 2016-10-17: 08:00:00 via INTRAVENOUS

## 2016-10-17 MED ORDER — PROPOFOL 500 MG/50ML IV EMUL
INTRAVENOUS | Status: DC | PRN
  Administered 2016-10-17: 150 ug/kg/min via INTRAVENOUS

## 2016-10-17 MED ORDER — GLYCOPYRROLATE 0.2 MG/ML IJ SOLN
INTRAMUSCULAR | Status: DC | PRN
  Administered 2016-10-17: 0.1 mg via INTRAVENOUS

## 2016-10-17 NOTE — Anesthesia Procedure Notes (Signed)
Date/Time: 10/17/2016 8:08 AM Performed by: Darlyne Russian Patient Re-evaluated:Patient Re-evaluated prior to inductionOxygen Delivery Method: Nasal cannula

## 2016-10-17 NOTE — Anesthesia Post-op Follow-up Note (Cosign Needed)
Anesthesia QCDR form completed.        

## 2016-10-17 NOTE — Anesthesia Postprocedure Evaluation (Signed)
Anesthesia Post Note  Patient: Paul Bass  Procedure(s) Performed: Procedure(s) (LRB): ESOPHAGOGASTRODUODENOSCOPY (EGD) WITH PROPOFOL (N/A) SAVORY DILATION (N/A)  Patient location during evaluation: Endoscopy Anesthesia Type: General Level of consciousness: awake and alert Pain management: pain level controlled Vital Signs Assessment: post-procedure vital signs reviewed and stable Respiratory status: spontaneous breathing and respiratory function stable Cardiovascular status: stable Anesthetic complications: no     Last Vitals:  Vitals:   10/17/16 0746 10/17/16 0823  BP: 112/61 (!) 82/58  Pulse: (!) 55 (!) 55  Resp: 18 10  Temp: 36.8 C 36.6 C    Last Pain:  Vitals:   10/17/16 0823  TempSrc: Tympanic  PainSc: Asleep                 Jeremaih Klima K

## 2016-10-17 NOTE — H&P (Signed)
Lucilla Lame, MD [facility] [address], [place], [place] [address] Phone:[phone] Fax : [phone]  Primary Care Physician:  [name], NP Primary Gastroenterologist:  Dr. Allen Norris  Pre-Procedure History & Physical: HPI:  Paul Bass is a 71 y.o. male is here for an endoscopy.   Past Medical History:  Diagnosis Date  . Anxiety   . BPH (benign prostatic hypertrophy)   . CAD S/P percutaneous coronary angioplasty   . COPD (chronic obstructive pulmonary disease) ([place])   . Depression   . HTN (hypertension)   . Hyperlipidemia   . Hypothyroidism   . Stroke ([place])   . Tobacco abuse     Past Surgical History:  Procedure Laterality Date  . CARDIAC CATHETERIZATION N/A 12/07/2015   Procedure: Left Heart Cath and Coronary Angiography;  Surgeon: Corey Skains, MD;  Location: [place] [facility];  Service: Cardiovascular;  Laterality: N/A;  . 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 ESOPHAGOGASTRODUODENOSCOPY (EGD) WITH PROPOFOL N/A 09/11/2016   Procedure: ESOPHAGOGASTRODUODENOSCOPY (EGD) WITH PROPOFOL;  Surgeon: [place] Lame, MD;  Location: [facility] ENDOS[facility];  Service: Endoscopy;  Laterality: N/A;  . pins R lower leg    . rotator cuff replaced     right    Prior to Admission medications   Medication Sig Start Date End Date Taking? Authorizing Provider  albuterol (PROVENTIL HFA;VENTOLIN HFA) 108 (90 BASE) MCG/ACT inhaler Inhale 2 puffs into the lungs every 6 (six) hours as needed for wheezing or shortness of breath. For shortness of breath   Yes Historical Provider, MD  atorvastatin (LIPITOR) 80 MG tablet Take 80 mg by mouth. 01/09/16  Yes Historical Provider, MD  citalopram (CELEXA) 20 MG tablet Take 20 mg by mouth daily.   Yes Historical Provider, MD  clonazePAM (KLONOPIN) 0.5 MG tablet Take 0.5 mg by mouth 2 (two) times daily as needed for anxiety.   Yes Historical Provider, MD  clopidogrel (PLAVIX) 75 MG tablet TAKE 1 TABLET (75  MG TOTAL) BY MOUTH ONCE DAILY. 12/21/15  Yes Historical Provider, MD  levothyroxine (SYNTHROID, LEVOTHROID) 100 MCG tablet Take 100 mcg by mouth daily before breakfast.   Yes Historical Provider, MD  losartan (COZAAR) 25 MG tablet Take by mouth.   Yes Historical Provider, MD  metFORMIN (GLUCOPHAGE) 500 MG tablet TAKE 1 TABLET BY MOUTH ONCE A DAY 09/07/14  Yes Historical Provider, MD  aspirin EC 81 MG tablet Take 81 mg by mouth daily.    Historical Provider, MD  budesonide-formoterol (SYMBICORT) 160-4.5 MCG/ACT inhaler Inhale 2 puffs into the lungs 2 (two) times daily.    Historical Provider, MD  buPROPion (WELLBUTRIN XL) 300 MG 24 hr tablet  06/22/16   Historical Provider, MD  cholecalciferol (VITAMIN D) 400 UNITS TABS Take 400 Units by mouth 2 (two) times daily.    Historical Provider, MD  Coenzyme Q10 (CO Q-10) 200 MG CAPS Take 1 capsule by mouth daily.    Historical Provider, MD  glucose blood test strip     Historical Provider, MD  Multiple Vitamin (MULTIVITAMIN WITH MINERALS) TABS Take 0.5 tablets by mouth 2 (two) times daily.    Historical Provider, MD  Omega-3 Fatty Acids (FISH OIL) 1200 MG CAPS Take 1,000 mg by mouth 2 (two) times daily.    Historical Provider, MD  PSYLLIUM PO Take by mouth.    Historical Provider, MD  RA KRILL OIL 500 MG CAPS Take by mouth.    Historical Provider, MD  ranitidine (ZANTAC) 300 MG tablet Take 300 mg by mouth at bedtime.    Historical Provider, MD  tamsulosin (FLOMAX) 0.4 MG CAPS capsule Take 0.4 mg by mouth daily.  06/16/14   Historical Provider, MD    Allergies as of 10/17/2016  . (No Known Allergies)    Family History  Problem Relation Age of Onset  . Heart failure Father   . Throat cancer Mother   . Diabetes Brother     Social History   Social History  . Marital status: Married    Spouse name: Vaughan Basta  . Number of children: 1  . Years of education: HS   Occupational History  . Retired     Part Time [employer]   Social History Main  Topics  . Smoking status: Former Smoker    Packs/day: 2.00    Types: Cigarettes    Quit date: 05/20/2012  . Smokeless tobacco: Never Used  . Alcohol use No     Comment: weekend drinker, had 5 beers on 05/11/12, prior to stroke, No alcohol since stroke  . Drug use: Unknown  . Sexual activity: Not on file   Other Topics Concern  . Not on file   Social History Narrative   Patient lives at home with his wife works at  [employer] has a 12 grade education with 1 child.   Patient quit smoking 05-11-2012 patient drinks alcoholic drinks he also drinks cafinted drinks daily.    Review of Systems: See HPI, otherwise negative ROS  Physical Exam: BP 112/61   Pulse (!) 55   Temp 98.2 F (36.8 C) (Tympanic)   Resp 18   Wt 227 lb (103 kg)   SpO2 97%   BMI 31.66 kg/m  General:   Alert,  pleasant and cooperative in NAD Head:  Normocephalic and atraumatic. Neck:  Supple; no masses or thyromegaly. Lungs:  Clear throughout to auscultation.    Heart:  Regular rate and rhythm. Abdomen:  Soft, nontender and nondistended. Normal bowel sounds, without guarding, and without rebound.   Neurologic:  Alert and  oriented x4;  grossly normal neurologically.  Impression/Plan: Paul Bass is here for an endoscopy to be performed for dysphagia  Risks, benefits, limitations, and alternatives regarding  endoscopy have been reviewed with the patient.  Questions have been answered.  All parties agreeable.   [place] Lame, MD  10/17/2016, 8:08 AM

## 2016-10-17 NOTE — Op Note (Signed)
[facility] Patient Name: Paul Bass Procedure Date: 10/17/2016 8:07 AM MRN: [id number] Account #: [id number] Date of Birth: 10/04/1945 Admit Type: Outpatient Age: 71 Room: [facility] ENDO ROOM 4 Gender: Male Note Status: Finalized Procedure:            Upper GI endoscopy Indications:          Stenosis of the esophagus Providers:            [place] Lame MD, MD Referring MD:         Juluis Rainier (Referring MD) Medicines:            Propofol per Anesthesia Complications:        No immediate complications. Procedure:            Pre-Anesthesia Assessment:                       - Prior to the procedure, a History and Physical was                        performed, and patient medications and allergies were                        reviewed. The patient's tolerance of previous                        anesthesia was also reviewed. The risks and benefits of                        the procedure and the sedation options and risks were                        discussed with the patient. All questions were                        answered, and informed consent was obtained. Prior                        Anticoagulants: The patient has taken no previous                        anticoagulant or antiplatelet agents. ASA Grade                        Assessment: II - A patient with mild systemic disease.                        After reviewing the risks and benefits, the patient was                        deemed in satisfactory condition to undergo the                        procedure.                       After obtaining informed consent, the endoscope was                        passed under direct vision. Throughout the procedure,  the patient's blood pressure, pulse, and oxygen                        saturations were monitored continuously. The Endoscope                        was introduced through the mouth, and advanced to the       second part of duodenum. The upper GI endoscopy was                        accomplished without difficulty. The patient tolerated                        the procedure well. Findings:      One moderate benign-appearing, intrinsic stenosis was found at the       gastroesophageal junction. And was traversed. A TTS dilator was passed       through the scope. Dilation with a 12-13.5-15 mm balloon dilator was       performed to 15 mm.      The stomach was normal.      The examined duodenum was normal. Impression:           - Benign-appearing esophageal stenosis. Dilated.                       - Normal stomach.                       - Normal examined duodenum.                       - No specimens collected. Recommendation:       - Discharge patient to home.                       - Resume previous diet.                       - Continue present medications.                       - Repeat upper endoscopy PRN for retreatment. Procedure Code(s):    --- Professional ---                       [id number], Esophagogastroduodenoscopy, flexible, transoral;                        with transendoscopic balloon dilation of esophagus                        (less than 30 mm diameter) Diagnosis Code(s):    --- Professional ---                       K22.2, Esophageal obstruction CPT copyright 2016 American Medical Association. All rights reserved. The codes documented in this report are preliminary and upon coder review may  be revised to meet current compliance requirements. Lucilla Lame MD, MD 10/17/2016 8:20:19 AM This report has been signed electronically. Number of Addenda: 0 Note Initiated On: 10/17/2016 8:07 AM      [facility]

## 2016-10-17 NOTE — Anesthesia Preprocedure Evaluation (Addendum)
Anesthesia Evaluation  Patient identified by MRN, date of birth, ID band Patient awake    Reviewed: Allergy & Precautions, NPO status , Patient's Chart, lab work & pertinent test results  History of Anesthesia Complications Negative for: history of anesthetic complications  Airway Mallampati: III       Dental   Pulmonary COPD,  COPD inhaler, former smoker,           Cardiovascular hypertension, Pt. on medications + CAD, + CABG and + Peripheral Vascular Disease       Neuro/Psych Anxiety Depression CVA    GI/Hepatic GERD  Medicated,  Endo/Other  diabetes, Type 2, Oral Hypoglycemic AgentsHypothyroidism   Renal/GU      Musculoskeletal   Abdominal (+) + obese,   Peds  Hematology   Anesthesia Other Findings   Reproductive/Obstetrics                           Anesthesia Physical Anesthesia Plan  ASA: III  Anesthesia Plan: General   Post-op Pain Management:    Induction: Intravenous  Airway Management Planned: Nasal Cannula  Additional Equipment:   Intra-op Plan:   Post-operative Plan:   Informed Consent: I have reviewed the patients History and Physical, chart, labs and discussed the procedure including the risks, benefits and alternatives for the proposed anesthesia with the patient or authorized representative who has indicated his/her understanding and acceptance.     Plan Discussed with:   Anesthesia Plan Comments:         Anesthesia Quick Evaluation

## 2016-10-17 NOTE — Transfer of Care (Signed)
Immediate Anesthesia Transfer of Care Note  Patient: Paul Bass  Procedure(s) Performed: Procedure(s): ESOPHAGOGASTRODUODENOSCOPY (EGD) WITH PROPOFOL (N/A) SAVORY DILATION (N/A)  Patient Location: PACU  Anesthesia Type:General  Level of Consciousness: responds to stimulation  Airway & Oxygen Therapy: Patient Spontanous Breathing and Patient connected to nasal cannula oxygen  Post-op Assessment: Report given to RN and Post -op Vital signs reviewed and stable  Post vital signs: Reviewed and stable  Last Vitals:  Vitals:   10/17/16 0746 10/17/16 0823  BP: 112/61 (!) 82/58  Pulse: (!) 55 (!) 55  Resp: 18 10  Temp: 36.8 C 36.6 C    Last Pain:  Vitals:   10/17/16 0823  TempSrc: Tympanic  PainSc: Asleep         Complications: No apparent anesthesia complications

## 2016-10-18 ENCOUNTER — Encounter: Payer: Self-pay | Admitting: Gastroenterology

## 2017-01-17 ENCOUNTER — Telehealth: Payer: Self-pay | Admitting: Gastroenterology

## 2017-01-17 NOTE — Telephone Encounter (Signed)
01/17/17 Spoke with Merrily Pew at [place] is NOT required for EGD 21624 / R13.10 Dysphagia for the DOS of 10/17/16.  Reference #:  [id number].  Resubmit claim

## 2017-03-12 ENCOUNTER — Telehealth: Payer: Self-pay | Admitting: Neurology

## 2017-03-12 NOTE — Telephone Encounter (Signed)
Patients wife Vaughan Basta (listed on DPR) called office in reference to see if patient is needing to have a doppler scheduled this year.  She states patient normally does one each year.  Please call Per Vaughan Basta if she does not answer please leave a detailed message.

## 2017-03-12 NOTE — Telephone Encounter (Signed)
Rn call patients wife that pt was referred back to his PCP. Rn stated the PCP can order a yearly doppler for the patient or his cardiologist. PTs wife pt had heart surgery last year and cancel the doppler. Rn stated Dr. Leonie Man is out of the office this week on vacation. PTs wife will contact PCP.Pts wife verbalized understanding.

## 2017-10-24 ENCOUNTER — Other Ambulatory Visit: Payer: Self-pay

## 2017-10-24 ENCOUNTER — Telehealth: Payer: Self-pay | Admitting: Gastroenterology

## 2017-10-24 DIAGNOSIS — R1319 Other dysphagia: Secondary | ICD-10-CM

## 2017-10-24 DIAGNOSIS — R1310 Dysphagia, unspecified: Secondary | ICD-10-CM

## 2017-10-24 NOTE — Telephone Encounter (Signed)
Pt scheduled for an EGD at [facility] on 11/13/17.

## 2017-10-24 NOTE — Telephone Encounter (Signed)
Please call today to set up EGD for patient. Vaughan Basta is his wife and she will take care of the appt. Please call and you may and to leave a message and she will call you back.

## 2017-11-12 ENCOUNTER — Encounter: Payer: Self-pay | Admitting: *Deleted

## 2017-11-12 ENCOUNTER — Telehealth: Payer: Self-pay

## 2017-11-12 NOTE — Telephone Encounter (Signed)
Called to confirm pt has stopped his Plavix 7 days prior to procedure. Per pts wife, he has stopped. See blood thinner request in media for further information.

## 2017-11-13 ENCOUNTER — Encounter: Payer: Self-pay | Admitting: *Deleted

## 2017-11-13 ENCOUNTER — Ambulatory Visit
Admission: RE | Admit: 2017-11-13 | Discharge: 2017-11-13 | Disposition: A | Discharge status: Home / Self Care | Payer: Medicare HMO | Source: Ambulatory Visit | Attending: Gastroenterology | Admitting: Gastroenterology

## 2017-11-13 ENCOUNTER — Ambulatory Visit: Payer: Medicare HMO | Admitting: Certified Registered Nurse Anesthetist

## 2017-11-13 ENCOUNTER — Encounter
Admission: RE | Disposition: A | Discharge status: Home / Self Care | Payer: Self-pay | Source: Ambulatory Visit | Attending: Gastroenterology

## 2017-11-13 DIAGNOSIS — Z800 Family history of malignant neoplasm of digestive organs: Secondary | ICD-10-CM | POA: Diagnosis not present

## 2017-11-13 DIAGNOSIS — Z79899 Other long term (current) drug therapy: Secondary | ICD-10-CM | POA: Insufficient documentation

## 2017-11-13 DIAGNOSIS — I10 Essential (primary) hypertension: Secondary | ICD-10-CM | POA: Diagnosis not present

## 2017-11-13 DIAGNOSIS — E039 Hypothyroidism, unspecified: Secondary | ICD-10-CM | POA: Insufficient documentation

## 2017-11-13 DIAGNOSIS — Z7982 Long term (current) use of aspirin: Secondary | ICD-10-CM | POA: Insufficient documentation

## 2017-11-13 DIAGNOSIS — Z955 Presence of coronary angioplasty implant and graft: Secondary | ICD-10-CM | POA: Diagnosis not present

## 2017-11-13 DIAGNOSIS — K222 Esophageal obstruction: Secondary | ICD-10-CM | POA: Diagnosis not present

## 2017-11-13 DIAGNOSIS — Z7951 Long term (current) use of inhaled steroids: Secondary | ICD-10-CM | POA: Insufficient documentation

## 2017-11-13 DIAGNOSIS — E785 Hyperlipidemia, unspecified: Secondary | ICD-10-CM | POA: Diagnosis not present

## 2017-11-13 DIAGNOSIS — Z833 Family history of diabetes mellitus: Secondary | ICD-10-CM | POA: Diagnosis not present

## 2017-11-13 DIAGNOSIS — R1310 Dysphagia, unspecified: Secondary | ICD-10-CM | POA: Diagnosis not present

## 2017-11-13 DIAGNOSIS — E119 Type 2 diabetes mellitus without complications: Secondary | ICD-10-CM | POA: Insufficient documentation

## 2017-11-13 DIAGNOSIS — Z951 Presence of aortocoronary bypass graft: Secondary | ICD-10-CM | POA: Diagnosis not present

## 2017-11-13 DIAGNOSIS — I739 Peripheral vascular disease, unspecified: Secondary | ICD-10-CM | POA: Diagnosis not present

## 2017-11-13 DIAGNOSIS — I69351 Hemiplegia and hemiparesis following cerebral infarction affecting right dominant side: Secondary | ICD-10-CM | POA: Diagnosis not present

## 2017-11-13 DIAGNOSIS — Z8249 Family history of ischemic heart disease and other diseases of the circulatory system: Secondary | ICD-10-CM | POA: Diagnosis not present

## 2017-11-13 DIAGNOSIS — Z7984 Long term (current) use of oral hypoglycemic drugs: Secondary | ICD-10-CM | POA: Diagnosis not present

## 2017-11-13 DIAGNOSIS — J449 Chronic obstructive pulmonary disease, unspecified: Secondary | ICD-10-CM | POA: Insufficient documentation

## 2017-11-13 DIAGNOSIS — I2510 Atherosclerotic heart disease of native coronary artery without angina pectoris: Secondary | ICD-10-CM | POA: Insufficient documentation

## 2017-11-13 DIAGNOSIS — F329 Major depressive disorder, single episode, unspecified: Secondary | ICD-10-CM | POA: Insufficient documentation

## 2017-11-13 DIAGNOSIS — N400 Enlarged prostate without lower urinary tract symptoms: Secondary | ICD-10-CM | POA: Diagnosis not present

## 2017-11-13 DIAGNOSIS — F419 Anxiety disorder, unspecified: Secondary | ICD-10-CM | POA: Diagnosis not present

## 2017-11-13 DIAGNOSIS — Z87891 Personal history of nicotine dependence: Secondary | ICD-10-CM | POA: Diagnosis not present

## 2017-11-13 DIAGNOSIS — R1319 Other dysphagia: Secondary | ICD-10-CM

## 2017-11-13 DIAGNOSIS — K449 Diaphragmatic hernia without obstruction or gangrene: Secondary | ICD-10-CM | POA: Insufficient documentation

## 2017-11-13 DIAGNOSIS — R479 Unspecified speech disturbances: Secondary | ICD-10-CM | POA: Insufficient documentation

## 2017-11-13 HISTORY — DX: Type 2 diabetes mellitus without complications: E11.9

## 2017-11-13 HISTORY — PX: ESOPHAGOGASTRODUODENOSCOPY (EGD) WITH PROPOFOL: SHX5813

## 2017-11-13 LAB — GLUCOSE, CAPILLARY: GLUCOSE-CAPILLARY: 130 mg/dL — AB (ref 65–99)

## 2017-11-13 SURGERY — ESOPHAGOGASTRODUODENOSCOPY (EGD) WITH PROPOFOL
Anesthesia: General

## 2017-11-13 MED ORDER — LIDOCAINE HCL (PF) 2 % IJ SOLN
INTRAMUSCULAR | Status: AC
  Filled 2017-11-13: qty 10

## 2017-11-13 MED ORDER — PANTOPRAZOLE SODIUM 40 MG PO TBEC: 40.0000 mg | DELAYED_RELEASE_TABLET | Freq: Every day | ORAL | 11 refills | Status: DC

## 2017-11-13 MED ORDER — PROPOFOL 500 MG/50ML IV EMUL
INTRAVENOUS | Status: AC
  Filled 2017-11-13: qty 50

## 2017-11-13 MED ORDER — LIDOCAINE HCL (CARDIAC) 20 MG/ML IV SOLN
INTRAVENOUS | Status: DC | PRN
  Administered 2017-11-13: 50 mg via INTRAVENOUS

## 2017-11-13 MED ORDER — PROPOFOL 500 MG/50ML IV EMUL
INTRAVENOUS | Status: DC | PRN
  Administered 2017-11-13: 140 ug/kg/min via INTRAVENOUS

## 2017-11-13 MED ORDER — LIDOCAINE HCL (PF) 1 % IJ SOLN
INTRAMUSCULAR | Status: AC
  Administered 2017-11-13: 0.3 mL via INTRADERMAL
  Filled 2017-11-13: qty 2

## 2017-11-13 MED ORDER — SODIUM CHLORIDE 0.9 % IV SOLN
INTRAVENOUS | Status: DC
  Administered 2017-11-13: 1000 mL via INTRAVENOUS

## 2017-11-13 MED ORDER — LIDOCAINE HCL (PF) 1 % IJ SOLN
2.0000 mL | Freq: Once | INTRAMUSCULAR | Status: AC
  Administered 2017-11-13: 0.3 mL via INTRADERMAL

## 2017-11-13 MED ORDER — PROPOFOL 10 MG/ML IV BOLUS
INTRAVENOUS | Status: DC | PRN
  Administered 2017-11-13: 100 mg via INTRAVENOUS

## 2017-11-13 NOTE — Anesthesia Preprocedure Evaluation (Signed)
Anesthesia Evaluation  Patient identified by MRN, date of birth, ID band Patient awake    Reviewed: Allergy & Precautions, NPO status , Patient's Chart, lab work & pertinent test results  History of Anesthesia Complications Negative for: history of anesthetic complications  Airway Mallampati: III       Dental  (+) Upper Dentures   Pulmonary neg sleep apnea, COPD,  COPD inhaler, former smoker,           Cardiovascular hypertension, Pt. on medications + CAD, + CABG and + Peripheral Vascular Disease  (-) CHF (-) dysrhythmias (-) Valvular Problems/Murmurs     Neuro/Psych neg Seizures Anxiety Depression CVA (R side weakness, speech difficulties), Residual Symptoms    GI/Hepatic Neg liver ROS, GERD  ,  Endo/Other  diabetes, Type 2, Oral Hypoglycemic AgentsHypothyroidism   Renal/GU negative Renal ROS     Musculoskeletal   Abdominal   Peds  Hematology   Anesthesia Other Findings   Reproductive/Obstetrics                             Anesthesia Physical Anesthesia Plan  ASA: III  Anesthesia Plan: General   Post-op Pain Management:    Induction: Intravenous  PONV Risk Score and Plan: 2 and TIVA and Propofol infusion  Airway Management Planned: Nasal Cannula  Additional Equipment:   Intra-op Plan:   Post-operative Plan:   Informed Consent: I have reviewed the patients History and Physical, chart, labs and discussed the procedure including the risks, benefits and alternatives for the proposed anesthesia with the patient or authorized representative who has indicated his/her understanding and acceptance.     Plan Discussed with:   Anesthesia Plan Comments:         Anesthesia Quick Evaluation

## 2017-11-13 NOTE — Op Note (Signed)
[facility] Patient Name: Paul Bass Procedure Date: 11/13/2017 9:07 AM MRN: [id number] Account #: [id number] Date of Birth: 07-Jul-1946 Admit Type: Outpatient Age: 72 Room: [facility] ENDO ROOM 4 Gender: Male Note Status: Finalized Procedure:            Upper GI endoscopy Indications:          Dysphagia Providers:            Lucilla Lame MD, MD Referring MD:         Juluis Rainier (Referring MD) Medicines:            Propofol per Anesthesia Complications:        No immediate complications. Procedure:            Pre-Anesthesia Assessment:                       - Prior to the procedure, a History and Physical was                        performed, and patient medications and allergies were                        reviewed. The patient's tolerance of previous                        anesthesia was also reviewed. The risks and benefits of                        the procedure and the sedation options and risks were                        discussed with the patient. All questions were                        answered, and informed consent was obtained. Prior                        Anticoagulants: The patient has taken no previous                        anticoagulant or antiplatelet agents. ASA Grade                        Assessment: II - A patient with mild systemic disease.                        After reviewing the risks and benefits, the patient was                        deemed in satisfactory condition to undergo the                        procedure.                       After obtaining informed consent, the endoscope was                        passed under direct vision. Throughout the procedure,  the patient's blood pressure, pulse, and oxygen                        saturations were monitored continuously. The Endoscope                        was introduced through the mouth, and advanced to the                        second part  of duodenum. The upper GI endoscopy was                        accomplished without difficulty. The patient tolerated                        the procedure well. Findings:      One benign-appearing, intrinsic moderate stenosis was found at the       gastroesophageal junction. The stenosis was traversed. A TTS dilator was       passed through the scope. Dilation with a 15-16.5-18 mm balloon dilator       was performed to 18 mm. The dilation site was examined following       endoscope reinsertion and showed complete resolution of luminal       narrowing.      A small hiatal hernia was present.      The stomach was normal.      The examined duodenum was normal. Impression:           - Benign-appearing esophageal stenosis. Dilated.                       - Small hiatal hernia.                       - Normal stomach.                       - Normal examined duodenum.                       - No specimens collected. Recommendation:       - Discharge patient to home.                       - Resume previous diet.                       - Continue present medications.                       - Repeat upper endoscopy PRN for retreatment. Procedure Code(s):    --- Professional ---                       [phone], Esophagogastroduodenoscopy, flexible, transoral;                        with transendoscopic balloon dilation of esophagus                        (less than 30 mm diameter) Diagnosis Code(s):    --- Professional ---  R13.10, Dysphagia, unspecified                       K22.2, Esophageal obstruction CPT copyright 2017 American Medical Association. All rights reserved. The codes documented in this report are preliminary and upon coder review may  be revised to meet current compliance requirements. Lucilla Lame MD, MD 11/13/2017 9:18:52 AM This report has been signed electronically. Number of Addenda: 0 Note Initiated On: 11/13/2017 9:07 AM      [facility]

## 2017-11-13 NOTE — H&P (Signed)
Lucilla Lame, MD [facility] [address] [place], [place] [address] Phone:[phone] Fax : [phone]  Primary Care Physician:  [name], NP Primary Gastroenterologist:  Dr. Allen Norris  Pre-Procedure History & Physical: HPI:  Paul Bass is a 71 y.o. male is here for an endoscopy.   Past Medical History:  Diagnosis Date  . Anxiety   . BPH (benign prostatic hypertrophy)   . CAD S/P percutaneous coronary angioplasty   . COPD (chronic obstructive pulmonary disease) ([place])   . Depression   . Diabetes mellitus without complication ([place])   . HTN (hypertension)   . Hyperlipidemia   . Hypothyroidism   . Stroke ([place])   . Tobacco abuse     Past Surgical History:  Procedure Laterality Date  . CARDIAC CATHETERIZATION N/A 12/07/2015   Procedure: Left Heart Cath and Coronary Angiography;  Surgeon: Corey Skains, MD;  Location: [place] [facility];  Service: Cardiovascular;  Laterality: N/A;  . 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 ESOPHAGOGASTRODUODENOSCOPY (EGD) WITH PROPOFOL N/A 09/11/2016   Procedure: ESOPHAGOGASTRODUODENOSCOPY (EGD) WITH PROPOFOL;  Surgeon: Lucilla Lame, MD;  Location: [facility];  Service: Endoscopy;  Laterality: N/A;  . ESOPHAGOGASTRODUODENOSCOPY (EGD) WITH PROPOFOL N/A 10/17/2016   Procedure: ESOPHAGOGASTRODUODENOSCOPY (EGD) WITH PROPOFOL;  Surgeon: Lucilla Lame, MD;  Location: [facility];  Service: Endoscopy;  Laterality: N/A;  . pins R lower leg    . rotator cuff replaced     right  . SAVORY DILATION N/A 10/17/2016   Procedure: SAVORY DILATION;  Surgeon: Lucilla Lame, MD;  Location: [facility];  Service: Endoscopy;  Laterality: N/A;    Prior to Admission medications   Medication Sig Start Date End Date Taking? Authorizing Provider  albuterol (PROVENTIL HFA;VENTOLIN HFA) 108 (90 BASE) MCG/ACT inhaler Inhale 2 puffs into the lungs every 6 (six) hours as needed for wheezing or shortness of  breath. For shortness of breath    [provider]  aspirin EC 81 MG tablet Take 81 mg by mouth daily.    [provider]  atorvastatin (LIPITOR) 80 MG tablet Take 80 mg by mouth. 01/09/16   [provider]  budesonide-formoterol (SYMBICORT) 160-4.5 MCG/ACT inhaler Inhale 2 puffs into the lungs 2 (two) times daily.    [provider]  buPROPion (WELLBUTRIN XL) 300 MG 24 hr tablet  06/22/16   [provider]  cholecalciferol (VITAMIN D) 400 UNITS TABS Take 400 Units by mouth 2 (two) times daily.    [provider]  citalopram (CELEXA) 20 MG tablet Take 20 mg by mouth daily.    [provider]  clonazePAM (KLONOPIN) 0.5 MG tablet Take 0.5 mg by mouth 2 (two) times daily as needed for anxiety.    [provider]  clopidogrel (PLAVIX) 75 MG tablet TAKE 1 TABLET (75 MG TOTAL) BY MOUTH ONCE DAILY. 12/21/15   [provider]  Coenzyme Q10 (CO Q-10) 200 MG CAPS Take 1 capsule by mouth daily.    [provider]  glucose blood test strip     [provider]  levothyroxine (SYNTHROID, LEVOTHROID) 100 MCG tablet Take 100 mcg by mouth daily before breakfast.    [provider]  losartan (COZAAR) 25 MG tablet Take by mouth.    [provider]  metFORMIN (GLUCOPHAGE) 500 MG tablet TAKE 1 TABLET BY MOUTH ONCE A DAY 09/07/14   [provider]  Multiple Vitamin (MULTIVITAMIN WITH MINERALS) TABS Take 0.5  tablets by mouth 2 (two) times daily.    [provider]  Omega-3 Fatty Acids (FISH OIL) 1200 MG CAPS Take 1,000 mg by mouth 2 (two) times daily.    [provider]  PSYLLIUM PO Take by mouth.    [provider]  RA KRILL OIL 500 MG CAPS Take by mouth.    [provider]  ranitidine (ZANTAC) 300 MG tablet Take 300 mg by mouth at bedtime.    [provider]  tamsulosin (FLOMAX) 0.4 MG CAPS capsule Take 0.4 mg by mouth daily.  06/16/14   [provider]    Allergies as of 10/25/2017  . (No Known Allergies)    Family History  Problem Relation Age of Onset  . Heart failure Father   . Throat cancer Mother   . Diabetes Brother     Social History   Socioeconomic History  . Marital status: Married    Spouse name: Vaughan Basta  . Number of children: 1  . Years of education: HS  . Highest education level: Not on file  Occupational History  . Occupation: Retired    Comment: Part Time [employer]  Social Needs  . Financial resource strain: Not on file  . Food insecurity:    Worry: Not on file    Inability: Not on file  . Transportation needs:    Medical: Not on file    Non-medical: Not on file  Tobacco Use  . Smoking status: Former Smoker    Packs/day: 2.00    Types: Cigarettes    Last attempt to quit: 05/20/2012    Years since quitting: 5.4  . Smokeless tobacco: Never Used  Substance and Sexual Activity  . Alcohol use: No    Comment: weekend drinker, had 5 beers on 05/11/12, prior to stroke, No alcohol since stroke  . Drug use: Not on file  . Sexual activity: Not on file  Lifestyle  . Physical activity:    Days per week: Not on file    Minutes per session: Not on file  . Stress: Not on file  Relationships  . Social connections:    Talks on phone: Not on file    Gets together: Not on file    Attends religious service: Not on file    Active member of club or organization: Not on file    Attends meetings of clubs or organizations: Not on file    Relationship status: Not on file  . Intimate partner violence:    Fear of current or ex partner: Not on file    Emotionally abused: Not on file    Physically abused: Not on file    Forced sexual activity: Not on file  Other Topics Concern  . Not on file  Social History Narrative   Patient lives at home with his wife works at  [employer] has a 12 grade education with 1 child.   Patient quit smoking 05-11-2012 patient drinks alcoholic drinks he also drinks cafinted  drinks daily.    Review of Systems: See HPI, otherwise negative ROS  Physical Exam: BP 116/68   Pulse 75   Temp (!) 96.2 F (35.7 C)   Resp 17   Ht 6' (1.829 m)   Wt 230 lb (104.3 kg)   SpO2 100%   BMI 31.19 kg/m  General:   Alert,  pleasant and cooperative in NAD Head:  Normocephalic and atraumatic. Neck:  Supple; no masses or thyromegaly. Lungs:  Clear throughout to auscultation.  Heart:  Regular rate and rhythm. Abdomen:  Soft, nontender and nondistended. Normal bowel sounds, without guarding, and without rebound.   Neurologic:  Alert and  oriented x4;  grossly normal neurologically.  Impression/Plan: Paul Bass is here for an endoscopy to be performed for dysphagia  Risks, benefits, limitations, and alternatives regarding  endoscopy have been reviewed with the patient.  Questions have been answered.  All parties agreeable.   Lucilla Lame, MD  11/13/2017, 8:56 AM

## 2017-11-13 NOTE — Anesthesia Postprocedure Evaluation (Signed)
Anesthesia Post Note  Patient: Paul Bass  Procedure(s) Performed: ESOPHAGOGASTRODUODENOSCOPY (EGD) WITH PROPOFOL (N/A )  Patient location during evaluation: Endoscopy Anesthesia Type: General Level of consciousness: awake and alert Pain management: pain level controlled Vital Signs Assessment: post-procedure vital signs reviewed and stable Respiratory status: spontaneous breathing and respiratory function stable Cardiovascular status: stable Anesthetic complications: no     Last Vitals:  Vitals:   11/13/17 0742 11/13/17 0922  BP: 116/68 (!) 125/57  Pulse: 75 (!) 54  Resp: 17 16  Temp: (!) 35.7 C (!) 36.2 C  SpO2: 100% 95%    Last Pain:  Vitals:   11/13/17 0922  TempSrc: Tympanic  PainSc: Asleep                 Sophiah Rolin K

## 2017-11-13 NOTE — Transfer of Care (Signed)
Immediate Anesthesia Transfer of Care Note  Patient: Paul Bass  Procedure(s) Performed: ESOPHAGOGASTRODUODENOSCOPY (EGD) WITH PROPOFOL (N/A )  Patient Location: PACU and Endoscopy Unit  Anesthesia Type:General  Level of Consciousness: drowsy  Airway & Oxygen Therapy: Patient Spontanous Breathing and Patient connected to nasal cannula oxygen  Post-op Assessment: Report given to RN and Post -op Vital signs reviewed and stable  Post vital signs: Reviewed and stable  Last Vitals:  Vitals Value Taken Time  BP 125/57 11/13/2017  9:22 AM  Temp    Pulse 60 11/13/2017  9:23 AM  Resp 16 11/13/2017  9:23 AM  SpO2 95 % 11/13/2017  9:23 AM  Vitals shown include unvalidated device data.  Last Pain: There were no vitals filed for this visit.       Complications: No apparent anesthesia complications

## 2017-11-13 NOTE — Anesthesia Post-op Follow-up Note (Signed)
Anesthesia QCDR form completed.        

## 2017-11-14 ENCOUNTER — Encounter: Payer: Self-pay | Admitting: Gastroenterology

## 2018-06-05 ENCOUNTER — Other Ambulatory Visit: Payer: Self-pay | Admitting: Gastroenterology

## 2018-11-04 ENCOUNTER — Ambulatory Visit: Payer: Medicare HMO | Admitting: Urology

## 2018-11-29 ENCOUNTER — Ambulatory Visit: Payer: Medicare HMO | Admitting: Urology

## 2019-01-01 ENCOUNTER — Encounter

## 2019-01-01 ENCOUNTER — Ambulatory Visit: Payer: Medicare HMO | Admitting: Urology

## 2019-01-14 ENCOUNTER — Ambulatory Visit
Admission: RE | Admit: 2019-01-14 | Discharge: 2019-01-14 | Disposition: A | Discharge status: Home / Self Care | Payer: Medicare HMO | Attending: Urology | Admitting: Urology

## 2019-01-14 ENCOUNTER — Ambulatory Visit (INDEPENDENT_AMBULATORY_CARE_PROVIDER_SITE_OTHER): Payer: Medicare HMO | Admitting: Urology

## 2019-01-14 ENCOUNTER — Ambulatory Visit
Admission: RE | Admit: 2019-01-14 | Discharge: 2019-01-14 | Disposition: A | Discharge status: Home / Self Care | Payer: Medicare HMO | Source: Ambulatory Visit | Attending: Urology | Admitting: Urology

## 2019-01-14 ENCOUNTER — Other Ambulatory Visit: Payer: Self-pay

## 2019-01-14 ENCOUNTER — Encounter: Payer: Self-pay | Admitting: Urology

## 2019-01-14 VITALS — BP 153/80 | HR 80 | Ht 71.0 in | Wt 231.6 lb

## 2019-01-14 DIAGNOSIS — I2510 Atherosclerotic heart disease of native coronary artery without angina pectoris: Secondary | ICD-10-CM | POA: Insufficient documentation

## 2019-01-14 DIAGNOSIS — N401 Enlarged prostate with lower urinary tract symptoms: Secondary | ICD-10-CM | POA: Diagnosis not present

## 2019-01-14 DIAGNOSIS — K219 Gastro-esophageal reflux disease without esophagitis: Secondary | ICD-10-CM | POA: Insufficient documentation

## 2019-01-14 DIAGNOSIS — F32A Depression, unspecified: Secondary | ICD-10-CM | POA: Insufficient documentation

## 2019-01-14 DIAGNOSIS — N200 Calculus of kidney: Secondary | ICD-10-CM

## 2019-01-14 DIAGNOSIS — G4730 Sleep apnea, unspecified: Secondary | ICD-10-CM | POA: Insufficient documentation

## 2019-01-14 DIAGNOSIS — G4761 Periodic limb movement disorder: Secondary | ICD-10-CM | POA: Insufficient documentation

## 2019-01-14 DIAGNOSIS — F329 Major depressive disorder, single episode, unspecified: Secondary | ICD-10-CM | POA: Insufficient documentation

## 2019-01-14 DIAGNOSIS — E119 Type 2 diabetes mellitus without complications: Secondary | ICD-10-CM | POA: Insufficient documentation

## 2019-01-14 LAB — BLADDER SCAN AMB NON-IMAGING

## 2019-01-14 NOTE — Patient Instructions (Signed)

## 2019-01-14 NOTE — Progress Notes (Signed)
01/14/2019 12:07 PM   Paul Bass October 31, 1945 [id number]  Referring provider: [name], NP [address] [place],  [place] [address]  Chief Complaint  Patient presents with  . Benign Prostatic Hypertrophy  . Nocturia    HPI: 73 year old male presents to establish local urologic care.  He is a previous patient of Dr. Bjorn Loser and has been followed for BPH with incomplete bladder emptying and nephrolithiasis.  He last saw Dr. Jacqlyn Larsen in May 2019.  PVR at that visit was 194 mL.  He remains on tamsulosin 0.8 mg daily.  His most bothersome lower urinary tract symptoms include urinary frequency, urgency and nocturia x3-4.  IPSS completed today was 21/4.  Denies dysuria, gross hematuria or flank/abdominal/pelvic/scrotal pain.  KUB performed at [facility] last year showed right lower pole calculi largest measuring 4 mm and a 1.6 cm calcification overlying the pelvis felt to be a bladder calculus.  Dr. Jacqlyn Larsen felt the pelvic calcification was a pill in the colon.   PMH: Past Medical History:  Diagnosis Date  . Anxiety   . BPH (benign prostatic hypertrophy)   . CAD S/P percutaneous coronary angioplasty   . COPD (chronic obstructive pulmonary disease) ([place])   . Depression   . Diabetes mellitus without complication ([place])   . HTN (hypertension)   . Hyperlipidemia   . Hypothyroidism   . Stroke ([place])   . Tobacco abuse     Surgical History: Past Surgical History:  Procedure Laterality Date  . CARDIAC CATHETERIZATION N/A 12/07/2015   Procedure: Left Heart Cath and Coronary Angiography;  Surgeon: Corey Skains, MD;  Location: [facility];  Service: Cardiovascular;  Laterality: N/A;  . 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 ESOPHAGOGASTRODUODENOSCOPY (EGD) WITH PROPOFOL N/A 09/11/2016   Procedure: ESOPHAGOGASTRODUODENOSCOPY (EGD) WITH PROPOFOL;  Surgeon: Lucilla Lame, MD;  Location: [facility];  Service: Endoscopy;  Laterality: N/A;  .  ESOPHAGOGASTRODUODENOSCOPY (EGD) WITH PROPOFOL N/A 10/17/2016   Procedure: ESOPHAGOGASTRODUODENOSCOPY (EGD) WITH PROPOFOL;  Surgeon: Lucilla Lame, MD;  Location: [facility];  Service: Endoscopy;  Laterality: N/A;  . ESOPHAGOGASTRODUODENOSCOPY (EGD) WITH PROPOFOL N/A 11/13/2017   Procedure: ESOPHAGOGASTRODUODENOSCOPY (EGD) WITH PROPOFOL;  Surgeon: Lucilla Lame, MD;  Location: [facility];  Service: Endoscopy;  Laterality: N/A;  . pins R lower leg    . rotator cuff replaced     right  . SAVORY DILATION N/A 10/17/2016   Procedure: SAVORY DILATION;  Surgeon: Lucilla Lame, MD;  Location: [facility];  Service: Endoscopy;  Laterality: N/A;    Home Medications:  Allergies as of 01/14/2019   No Known Allergies     Medication List       Accurate as of January 14, 2019 12:07 PM. If you have any questions, ask your nurse or doctor.        albuterol 108 (90 Base) MCG/ACT inhaler Commonly known as: VENTOLIN HFA Inhale 2 puffs into the lungs every 6 (six) hours as needed for wheezing or shortness of breath. For shortness of breath   aspirin EC 81 MG tablet Take 81 mg by mouth daily.   atorvastatin 80 MG tablet Commonly known as: LIPITOR Take 80 mg by mouth.   budesonide-formoterol 160-4.5 MCG/ACT inhaler Commonly known as: SYMBICORT Inhale 2 puffs into the lungs 2 (two) times daily.   buPROPion 300 MG 24 hr tablet Commonly known as: WELLBUTRIN XL   cholecalciferol 10 MCG (400 UNIT) Tabs tablet Commonly known as: VITAMIN D3  Take 400 Units by mouth 2 (two) times daily.   Cinnamon Bark Powd Take by mouth.   citalopram 20 MG tablet Commonly known as: CELEXA Take 20 mg by mouth daily.   clonazePAM 0.5 MG tablet Commonly known as: KLONOPIN Take 0.5 mg by mouth 2 (two) times daily as needed for anxiety.   clopidogrel 75 MG tablet Commonly known as: PLAVIX TAKE 1 TABLET (75 MG TOTAL) BY MOUTH ONCE DAILY.   Co Q-10 200 MG Caps Take 1 capsule by mouth daily.   Fish Oil 1200 MG  Caps Take 1,000 mg by mouth 2 (two) times daily.   fluticasone 50 MCG/ACT nasal spray Commonly known as: FLONASE Place into the nose.   glucose blood test strip   levothyroxine 100 MCG tablet Commonly known as: SYNTHROID Take 100 mcg by mouth daily before breakfast.   losartan 100 MG tablet Commonly known as: COZAAR Take 50 mg by mouth daily.   metFORMIN 500 MG tablet Commonly known as: GLUCOPHAGE TAKE 1 TABLET BY MOUTH ONCE A DAY   Multi-Vitamin tablet Take by mouth.   multivitamin with minerals Tabs tablet Take 0.5 tablets by mouth 2 (two) times daily.   pantoprazole 40 MG tablet Commonly known as: PROTONIX TAKE 1 TABLET BY MOUTH EVERY DAY   PSYLLIUM PO Take by mouth.   RA Krill Oil 500 MG Caps Take by mouth.   ranitidine 300 MG tablet Commonly known as: ZANTAC Take 300 mg by mouth at bedtime.   tamsulosin 0.4 MG Caps capsule Commonly known as: FLOMAX Take 0.4 mg by mouth daily.       Allergies: No Known Allergies  Family History: Family History  Problem Relation Age of Onset  . Heart failure Father   . Throat cancer Mother   . Diabetes Brother     Social History:  reports that he quit smoking about 6 years ago. His smoking use included cigarettes. He smoked 2.00 packs per day. He has never used smokeless tobacco. He reports that he does not drink alcohol or use drugs.  ROS: UROLOGY Frequent Urination?: Yes Hard to postpone urination?: Yes Burning/pain with urination?: No Get up at night to urinate?: Yes Leakage of urine?: Yes Urine stream starts and stops?: No Trouble starting stream?: No Do you have to strain to urinate?: No Blood in urine?: No Urinary tract infection?: No Sexually transmitted disease?: No Injury to kidneys or bladder?: No Painful intercourse?: No Weak stream?: Yes Erection problems?: Yes Penile pain?: No  Gastrointestinal Nausea?: No Vomiting?: No Indigestion/heartburn?: Yes Diarrhea?: No Constipation?: No   Constitutional Fever: No Night sweats?: No Weight loss?: No Fatigue?: Yes  Skin Skin rash/lesions?: No Itching?: No  Eyes Blurred vision?: No Double vision?: No  Ears/Nose/Throat Sore throat?: No Sinus problems?: No  Hematologic/Lymphatic Swollen glands?: No Easy bruising?: No  Cardiovascular Leg swelling?: No Chest pain?: No  Respiratory Cough?: No Shortness of breath?: No  Endocrine Excessive thirst?: No  Musculoskeletal Back pain?: No Joint pain?: No  Neurological Headaches?: No Dizziness?: Yes  Psychologic Depression?: No Anxiety?: No  Physical Exam: BP (!) 153/80 (BP Location: Left Arm, Patient Position: Sitting, Cuff Size: Large)   Pulse 80   Ht 5\' 11"  (1.803 m)   Wt 231 lb 9.6 oz (105.1 kg)   BMI 32.30 kg/m   Constitutional:  Alert and oriented, No acute distress. HEENT: [place] AT, moist mucus membranes.  Trachea midline, no masses. Cardiovascular: No clubbing, cyanosis, or edema. Respiratory: Normal respiratory effort, no increased work of breathing.  GI: Abdomen is soft, nontender, nondistended, no abdominal masses GU: No CVA tenderness.  Prostate 40 g, smooth without nodules Lymph: No cervical or inguinal lymphadenopathy. Skin: No rashes, bruises or suspicious lesions. Neurologic: Grossly intact, no focal deficits, moving all 4 extremities. Psychiatric: Normal mood and affect.  Laboratory Data:  Urinalysis Dipstick/microscopy negative  Assessment & Plan:   73 year old male with BPH/incomplete bladder emptying and asymptomatic nephrolithiasis.  PVR by bladder scan today was stable at 195 mL.  He will continue tamsulosin.  His most bothersome symptoms are storage related and he was interested in a trial of Myrbetriq.  He was given 50 mg samples x4 weeks (lot: [id number]: 12/2020.  He will call back regarding efficacy.  KUB was ordered and he will be notified with results.   Return in about 1 year (around 01/14/2020) for Recheck.    Abbie Sons, [place] [address], [place], [place] [address] [phone]

## 2019-01-14 NOTE — Addendum Note (Signed)
Addended by: Donalee Citrin on: 01/14/2019 02:28 PM   Modules accepted: Orders

## 2019-01-15 LAB — URINALYSIS, COMPLETE
Bilirubin, UA: NEGATIVE
Glucose, UA: NEGATIVE
Ketones, UA: NEGATIVE
Leukocytes,UA: NEGATIVE
Nitrite, UA: NEGATIVE
Protein,UA: NEGATIVE
RBC, UA: NEGATIVE
Specific Gravity, UA: 1.015 (ref 1.005–1.030)
Urobilinogen, Ur: 0.2 mg/dL (ref 0.2–1.0)
pH, UA: 6.5 (ref 5.0–7.5)

## 2019-01-15 LAB — MICROSCOPIC EXAMINATION
Bacteria, UA: NONE SEEN
Epithelial Cells (non renal): NONE SEEN /hpf (ref 0–10)
RBC: NONE SEEN /hpf (ref 0–2)

## 2019-01-16 ENCOUNTER — Telehealth: Payer: Self-pay

## 2019-01-16 ENCOUNTER — Other Ambulatory Visit: Payer: Self-pay | Admitting: Urology

## 2019-01-16 DIAGNOSIS — N200 Calculus of kidney: Secondary | ICD-10-CM

## 2019-01-16 NOTE — Telephone Encounter (Signed)
-----   Message from Abbie Sons, MD sent at 01/16/2019 10:54 AM EDT ----- I do not see a definite stone overlying the renal shadows although there is a moderate amount of stool and bowel gas obscuring the renal outlines.  It looks like he has 3 stones in the gallbladder.  He may also have a stone in his bladder.  Recommend scheduling a renal ultrasound for further evaluation.  Order was entered.  Will call with results.

## 2019-01-17 NOTE — Telephone Encounter (Signed)
Patient notified

## 2019-01-27 ENCOUNTER — Ambulatory Visit
Admission: RE | Admit: 2019-01-27 | Discharge: 2019-01-27 | Disposition: A | Discharge status: Home / Self Care | Payer: Medicare HMO | Source: Ambulatory Visit | Attending: Urology | Admitting: Urology

## 2019-01-27 ENCOUNTER — Other Ambulatory Visit: Payer: Self-pay

## 2019-01-27 DIAGNOSIS — N200 Calculus of kidney: Secondary | ICD-10-CM

## 2019-01-29 ENCOUNTER — Other Ambulatory Visit: Payer: Self-pay

## 2019-01-29 ENCOUNTER — Telehealth: Payer: Self-pay

## 2019-01-29 DIAGNOSIS — Z1211 Encounter for screening for malignant neoplasm of colon: Secondary | ICD-10-CM

## 2019-01-29 NOTE — Telephone Encounter (Signed)
Gastroenterology Pre-Procedure Review  Request Date: 02/18/19 Requesting Physician: Dr. Allen Norris  PATIENT REVIEW QUESTIONS: The patient responded to the following health history questions as indicated:    1. Are you having any GI issues? no 2. Do you have a personal history of Polyps? yes (10 years ago) 3. Do you have a family history of Colon Cancer or Polyps? no 4. Diabetes Mellitus? yes (Type 2) 5. Joint replacements in the past 12 months?no 6. Major health problems in the past 3 months?no 7. Any artificial heart valves, MVP, or defibrillator?no    MEDICATIONS & ALLERGIES:    Patient reports the following regarding taking any anticoagulation/antiplatelet therapy:   Plavix, Coumadin, Eliquis, Xarelto, Lovenox, Pradaxa, Brilinta, or Effient? yes (Plavix.  Blood thinner sent to Dr. Nehemiah Massed) Aspirin? yes (81 mg)  Patient confirms/reports the following medications:  Current Outpatient Medications  Medication Sig Dispense Refill  . albuterol (PROVENTIL HFA;VENTOLIN HFA) 108 (90 BASE) MCG/ACT inhaler Inhale 2 puffs into the lungs every 6 (six) hours as needed for wheezing or shortness of breath. For shortness of breath    . aspirin EC 81 MG tablet Take 81 mg by mouth daily.    Marland Kitchen atorvastatin (LIPITOR) 80 MG tablet Take 80 mg by mouth.    . budesonide-formoterol (SYMBICORT) 160-4.5 MCG/ACT inhaler Inhale 2 puffs into the lungs 2 (two) times daily.    Marland Kitchen buPROPion (WELLBUTRIN XL) 300 MG 24 hr tablet     . cholecalciferol (VITAMIN D) 400 UNITS TABS Take 400 Units by mouth 2 (two) times daily.    Verneita Griffes Bark POWD Take by mouth.    . citalopram (CELEXA) 20 MG tablet Take 20 mg by mouth daily.    . clonazePAM (KLONOPIN) 0.5 MG tablet Take 0.5 mg by mouth 2 (two) times daily as needed for anxiety.    . clopidogrel (PLAVIX) 75 MG tablet TAKE 1 TABLET (75 MG TOTAL) BY MOUTH ONCE DAILY.  1  . Coenzyme Q10 (CO Q-10) 200 MG CAPS Take 1 capsule by mouth daily.    . fluticasone (FLONASE) 50 MCG/ACT  nasal spray Place into the nose.    Marland Kitchen glucose blood test strip     . levothyroxine (SYNTHROID, LEVOTHROID) 100 MCG tablet Take 100 mcg by mouth daily before breakfast.    . losartan (COZAAR) 100 MG tablet Take 50 mg by mouth daily.    . metFORMIN (GLUCOPHAGE) 500 MG tablet TAKE 1 TABLET BY MOUTH ONCE A DAY    . Multiple Vitamin (MULTI-VITAMIN) tablet Take by mouth.    . Multiple Vitamin (MULTIVITAMIN WITH MINERALS) TABS Take 0.5 tablets by mouth 2 (two) times daily.    . Omega-3 Fatty Acids (FISH OIL) 1200 MG CAPS Take 1,000 mg by mouth 2 (two) times daily.    . pantoprazole (PROTONIX) 40 MG tablet TAKE 1 TABLET BY MOUTH EVERY DAY 90 tablet 3  . PSYLLIUM PO Take by mouth.    . RA KRILL OIL 500 MG CAPS Take by mouth.    . ranitidine (ZANTAC) 300 MG tablet Take 300 mg by mouth at bedtime.    . tamsulosin (FLOMAX) 0.4 MG CAPS capsule Take 0.4 mg by mouth daily.      No current facility-administered medications for this visit.     Patient confirms/reports the following allergies:  No Known Allergies  No orders of the defined types were placed in this encounter.   AUTHORIZATION INFORMATION Primary Insurance: 1D#: Group #:  Secondary Insurance: 1D#: Group #:  SCHEDULE INFORMATION: Date: 02/18/19  Time: Location:[facility]

## 2019-01-30 ENCOUNTER — Telehealth: Payer: Self-pay

## 2019-01-30 NOTE — Telephone Encounter (Signed)
-----   Message from Abbie Sons, MD sent at 01/30/2019 12:56 PM EDT ----- Renal ultrasound did not show kidney stones.  He may have a stone in his bladder.  Would recommend cystoscopy for further evaluation.

## 2019-01-30 NOTE — Telephone Encounter (Signed)
.  mychart

## 2019-02-10 ENCOUNTER — Telehealth: Payer: Self-pay

## 2019-02-10 NOTE — Telephone Encounter (Signed)
Patient has been advised per Dr. Alveria Apley 01/29/19 blood thinner request received to stop Plavix 7 days prior to Colonoscopy 02/11/19 stop date.  Resume Plavix 4 days after procedure on 02/22/19.  Thanks [employer]

## 2019-02-14 ENCOUNTER — Other Ambulatory Visit: Payer: Self-pay

## 2019-02-14 ENCOUNTER — Other Ambulatory Visit
Admission: RE | Admit: 2019-02-14 | Discharge: 2019-02-14 | Disposition: A | Discharge status: Home / Self Care | Payer: Medicare HMO | Source: Ambulatory Visit | Attending: Gastroenterology | Admitting: Gastroenterology

## 2019-02-14 DIAGNOSIS — Z1159 Encounter for screening for other viral diseases: Secondary | ICD-10-CM | POA: Diagnosis present

## 2019-02-14 LAB — SARS CORONAVIRUS 2 (TAT 6-24 HRS): SARS Coronavirus 2: NEGATIVE

## 2019-02-18 ENCOUNTER — Ambulatory Visit: Payer: Medicare HMO | Admitting: Anesthesiology

## 2019-02-18 ENCOUNTER — Encounter: Payer: Self-pay | Admitting: Anesthesiology

## 2019-02-18 ENCOUNTER — Encounter
Admission: RE | Disposition: A | Discharge status: Home / Self Care | Payer: Self-pay | Source: Home / Self Care | Attending: Gastroenterology

## 2019-02-18 ENCOUNTER — Ambulatory Visit
Admission: RE | Admit: 2019-02-18 | Discharge: 2019-02-18 | Disposition: A | Discharge status: Home / Self Care | Payer: Medicare HMO | Attending: Gastroenterology | Admitting: Gastroenterology

## 2019-02-18 DIAGNOSIS — Z955 Presence of coronary angioplasty implant and graft: Secondary | ICD-10-CM | POA: Insufficient documentation

## 2019-02-18 DIAGNOSIS — D122 Benign neoplasm of ascending colon: Secondary | ICD-10-CM | POA: Insufficient documentation

## 2019-02-18 DIAGNOSIS — F329 Major depressive disorder, single episode, unspecified: Secondary | ICD-10-CM | POA: Insufficient documentation

## 2019-02-18 DIAGNOSIS — Z79899 Other long term (current) drug therapy: Secondary | ICD-10-CM | POA: Diagnosis not present

## 2019-02-18 DIAGNOSIS — Z7982 Long term (current) use of aspirin: Secondary | ICD-10-CM | POA: Diagnosis not present

## 2019-02-18 DIAGNOSIS — E039 Hypothyroidism, unspecified: Secondary | ICD-10-CM | POA: Insufficient documentation

## 2019-02-18 DIAGNOSIS — E785 Hyperlipidemia, unspecified: Secondary | ICD-10-CM | POA: Diagnosis not present

## 2019-02-18 DIAGNOSIS — Z1211 Encounter for screening for malignant neoplasm of colon: Secondary | ICD-10-CM

## 2019-02-18 DIAGNOSIS — I2510 Atherosclerotic heart disease of native coronary artery without angina pectoris: Secondary | ICD-10-CM | POA: Diagnosis not present

## 2019-02-18 DIAGNOSIS — D120 Benign neoplasm of cecum: Secondary | ICD-10-CM | POA: Insufficient documentation

## 2019-02-18 DIAGNOSIS — Z79890 Hormone replacement therapy (postmenopausal): Secondary | ICD-10-CM | POA: Insufficient documentation

## 2019-02-18 DIAGNOSIS — I69328 Other speech and language deficits following cerebral infarction: Secondary | ICD-10-CM | POA: Insufficient documentation

## 2019-02-18 DIAGNOSIS — D125 Benign neoplasm of sigmoid colon: Secondary | ICD-10-CM | POA: Diagnosis not present

## 2019-02-18 DIAGNOSIS — K5730 Diverticulosis of large intestine without perforation or abscess without bleeding: Secondary | ICD-10-CM | POA: Insufficient documentation

## 2019-02-18 DIAGNOSIS — I69351 Hemiplegia and hemiparesis following cerebral infarction affecting right dominant side: Secondary | ICD-10-CM | POA: Insufficient documentation

## 2019-02-18 DIAGNOSIS — K635 Polyp of colon: Secondary | ICD-10-CM

## 2019-02-18 DIAGNOSIS — Z7951 Long term (current) use of inhaled steroids: Secondary | ICD-10-CM | POA: Diagnosis not present

## 2019-02-18 DIAGNOSIS — K641 Second degree hemorrhoids: Secondary | ICD-10-CM | POA: Diagnosis not present

## 2019-02-18 DIAGNOSIS — N400 Enlarged prostate without lower urinary tract symptoms: Secondary | ICD-10-CM | POA: Diagnosis not present

## 2019-02-18 DIAGNOSIS — J449 Chronic obstructive pulmonary disease, unspecified: Secondary | ICD-10-CM | POA: Insufficient documentation

## 2019-02-18 DIAGNOSIS — F419 Anxiety disorder, unspecified: Secondary | ICD-10-CM | POA: Diagnosis not present

## 2019-02-18 DIAGNOSIS — Z7902 Long term (current) use of antithrombotics/antiplatelets: Secondary | ICD-10-CM | POA: Diagnosis not present

## 2019-02-18 DIAGNOSIS — Z7984 Long term (current) use of oral hypoglycemic drugs: Secondary | ICD-10-CM | POA: Insufficient documentation

## 2019-02-18 DIAGNOSIS — D123 Benign neoplasm of transverse colon: Secondary | ICD-10-CM | POA: Insufficient documentation

## 2019-02-18 DIAGNOSIS — Z87891 Personal history of nicotine dependence: Secondary | ICD-10-CM | POA: Insufficient documentation

## 2019-02-18 DIAGNOSIS — I10 Essential (primary) hypertension: Secondary | ICD-10-CM | POA: Insufficient documentation

## 2019-02-18 DIAGNOSIS — K219 Gastro-esophageal reflux disease without esophagitis: Secondary | ICD-10-CM | POA: Diagnosis not present

## 2019-02-18 DIAGNOSIS — E1151 Type 2 diabetes mellitus with diabetic peripheral angiopathy without gangrene: Secondary | ICD-10-CM | POA: Diagnosis not present

## 2019-02-18 DIAGNOSIS — Z951 Presence of aortocoronary bypass graft: Secondary | ICD-10-CM | POA: Insufficient documentation

## 2019-02-18 HISTORY — PX: COLONOSCOPY WITH PROPOFOL: SHX5780

## 2019-02-18 LAB — GLUCOSE, CAPILLARY: Glucose-Capillary: 151 mg/dL — ABNORMAL HIGH (ref 70–99)

## 2019-02-18 SURGERY — COLONOSCOPY WITH PROPOFOL
Anesthesia: General

## 2019-02-18 MED ORDER — PROPOFOL 500 MG/50ML IV EMUL
INTRAVENOUS | Status: AC
  Filled 2019-02-18: qty 50

## 2019-02-18 MED ORDER — PROPOFOL 10 MG/ML IV BOLUS
INTRAVENOUS | Status: DC | PRN
  Administered 2019-02-18: 70 mg via INTRAVENOUS

## 2019-02-18 MED ORDER — SODIUM CHLORIDE 0.9 % IV SOLN
INTRAVENOUS | Status: DC
  Administered 2019-02-18: 08:00:00 1000 mL via INTRAVENOUS

## 2019-02-18 MED ORDER — PROPOFOL 500 MG/50ML IV EMUL
INTRAVENOUS | Status: DC | PRN
  Administered 2019-02-18: 120 ug/kg/min via INTRAVENOUS

## 2019-02-18 MED ORDER — LIDOCAINE HCL (PF) 2 % IJ SOLN
INTRAMUSCULAR | Status: AC
  Filled 2019-02-18: qty 10

## 2019-02-18 NOTE — H&P (Signed)
Paul Lame, MD [facility] [address], [place], [place] [address] Phone: [phone] Fax : [phone]  Primary Care Physician:  Paul Lange, NP Primary Gastroenterologist:  Dr. Allen Bass  Pre-Procedure History & Physical: HPI:  [name] is a 73 y.o. male is here for a screening colonoscopy.   Past Medical History:  Diagnosis Date  . Anxiety   . BPH (benign prostatic hypertrophy)   . CAD S/P percutaneous coronary angioplasty   . COPD (chronic obstructive pulmonary disease) ([place])   . Depression   . Diabetes mellitus without complication ([employer])   . HTN (hypertension)   . Hyperlipidemia   . Hypothyroidism   . Stroke ([place])   . Tobacco abuse     Past Surgical History:  Procedure Laterality Date  . CARDIAC CATHETERIZATION N/A 12/07/2015   Procedure: Left Heart Cath and Coronary Angiography;  Surgeon: Corey Skains, MD;  Location: [place] [facility];  Service: Cardiovascular;  Laterality: N/A;  . 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 ESOPHAGOGASTRODUODENOSCOPY (EGD) WITH PROPOFOL N/A 09/11/2016   Procedure: ESOPHAGOGASTRODUODENOSCOPY (EGD) WITH PROPOFOL;  Surgeon: Paul Lame, MD;  Location: [facility];  Service: Endoscopy;  Laterality: N/A;  . ESOPHAGOGASTRODUODENOSCOPY (EGD) WITH PROPOFOL N/A 10/17/2016   Procedure: ESOPHAGOGASTRODUODENOSCOPY (EGD) WITH PROPOFOL;  Surgeon: Paul Lame, MD;  Location: [facility];  Service: Endoscopy;  Laterality: N/A;  . ESOPHAGOGASTRODUODENOSCOPY (EGD) WITH PROPOFOL N/A 11/13/2017   Procedure: ESOPHAGOGASTRODUODENOSCOPY (EGD) WITH PROPOFOL;  Surgeon: Paul Lame, MD;  Location: [facility] END[facility];  Service: Endoscopy;  Laterality: N/A;  . pins R lower leg    . rotator cuff replaced     right  . SAVORY DILATION N/A 10/17/2016   Procedure: SAVORY DILATION;  Surgeon: Paul Lame, MD;  Location: [facility];  Service: Endoscopy;  Laterality: N/A;    Prior to Admission medications    Medication Sig Start Date End Date Taking? Authorizing Provider  albuterol (PROVENTIL HFA;VENTOLIN HFA) 108 (90 BASE) MCG/ACT inhaler Inhale 2 puffs into the lungs every 6 (six) hours as needed for wheezing or shortness of breath. For shortness of breath   Yes [provider]  aspirin EC 81 MG tablet Take 81 mg by mouth daily.   Yes [provider]  atorvastatin (LIPITOR) 80 MG tablet Take 80 mg by mouth. 01/09/16  Yes [provider]  budesonide-formoterol (SYMBICORT) 160-4.5 MCG/ACT inhaler Inhale 2 puffs into the lungs 2 (two) times daily.   Yes [provider]  buPROPion (WELLBUTRIN XL) 300 MG 24 hr tablet  06/22/16  Yes [provider]  cholecalciferol (VITAMIN D) 400 UNITS TABS Take 400 Units by mouth 2 (two) times daily.   Yes [provider]  Cinnamon Bark POWD Take by mouth.   Yes [provider]  citalopram (CELEXA) 20 MG tablet Take 20 mg by mouth daily.   Yes [provider]  clonazePAM (KLONOPIN) 0.5 MG tablet Take 0.5 mg by mouth 2 (two) times daily as needed for anxiety.   Yes [provider]  clopidogrel (PLAVIX) 75 MG tablet TAKE 1 TABLET (75 MG TOTAL) BY MOUTH ONCE DAILY. 12/21/15  Yes [provider]  Coenzyme Q10 (CO Q-10) 200 MG CAPS Take 1 capsule by mouth daily.   Yes [provider]  fluticasone (FLONASE) 50 MCG/ACT nasal spray Place into the nose. 09/17/18  Yes [provider]  glucose blood test strip    Yes [provider]  levothyroxine (SYNTHROID,  LEVOTHROID) 100 MCG tablet Take 100 mcg by mouth daily before breakfast.   Yes [provider]  losartan (COZAAR) 100 MG tablet Take 50 mg by mouth daily. 01/02/19  Yes [provider]  metFORMIN (GLUCOPHAGE) 500 MG tablet TAKE 1 TABLET BY MOUTH ONCE A DAY 09/07/14  Yes [provider]  Multiple Vitamin (MULTI-VITAMIN) tablet Take by mouth.   Yes [provider]  Multiple Vitamin  (MULTIVITAMIN WITH MINERALS) TABS Take 0.5 tablets by mouth 2 (two) times daily.   Yes [provider]  Omega-3 Fatty Acids (FISH OIL) 1200 MG CAPS Take 1,000 mg by mouth 2 (two) times daily.   Yes [provider]  pantoprazole (PROTONIX) 40 MG tablet TAKE 1 TABLET BY MOUTH EVERY DAY 06/07/18  Yes Paul Lame, MD  PSYLLIUM PO Take by mouth.   Yes [provider]  RA KRILL OIL 500 MG CAPS Take by mouth.   Yes [provider]  ranitidine (ZANTAC) 300 MG tablet Take 300 mg by mouth at bedtime.   Yes [provider]  tamsulosin (FLOMAX) 0.4 MG CAPS capsule Take 0.4 mg by mouth daily.  06/16/14  Yes [provider]    Allergies as of 01/30/2019  . (No Known Allergies)    Family History  Problem Relation Age of Onset  . Heart failure Father   . Throat cancer Mother   . Diabetes Brother     Social History   Socioeconomic History  . Marital status: Married    Spouse name: Vaughan Basta  . Number of children: 1  . Years of education: HS  . Highest education level: Not on file  Occupational History  . Occupation: Retired    Comment: Part Time [employer]  Social Needs  . Financial resource strain: Not on file  . Food insecurity    Worry: Not on file    Inability: Not on file  . Transportation needs    Medical: Not on file    Non-medical: Not on file  Tobacco Use  . Smoking status: Former Smoker    Packs/day: 2.00    Types: Cigarettes    Quit date: 05/20/2012    Years since quitting: 6.7  . Smokeless tobacco: Never Used  Substance and Sexual Activity  . Alcohol use: No    Comment: weekend drinker, had 5 beers on 05/11/12, prior to stroke, No alcohol since stroke  . Drug use: Never  . Sexual activity: Yes    Birth control/protection: None  Lifestyle  . Physical activity    Days per week: Not on file    Minutes per session: Not on file  . Stress: Not on file  Relationships  . Social Herbalist on phone: Not on  file    Gets together: Not on file    Attends religious service: Not on file    Active member of club or organization: Not on file    Attends meetings of clubs or organizations: Not on file    Relationship status: Not on file  . Intimate partner violence    Fear of current or ex partner: Not on file    Emotionally abused: Not on file    Physically abused: Not on file    Forced sexual activity: Not on file  Other Topics Concern  . Not on file  Social History Narrative   Patient lives at home with his wife works at  [employer] has a 12 grade education with 1 child.  Patient quit smoking 05-11-2012 patient drinks alcoholic drinks he also drinks cafinted drinks daily.    Review of Systems: See HPI, otherwise negative ROS  Physical Exam: BP 136/89   Pulse 77   Temp (!) 96.8 F (36 C) (Tympanic)   Resp 20   Ht 5\' 11"  (1.803 m)   Wt 104.3 kg   SpO2 96%   BMI 32.08 kg/m  General:   Alert,  pleasant and cooperative in NAD Head:  Normocephalic and atraumatic. Neck:  Supple; no masses or thyromegaly. Lungs:  Clear throughout to auscultation.    Heart:  Regular rate and rhythm. Abdomen:  Soft, nontender and nondistended. Normal bowel sounds, without guarding, and without rebound.   Neurologic:  Alert and  oriented x4;  grossly normal neurologically.  Impression/Plan: Paul Bass is now here to undergo a screening colonoscopy.  Risks, benefits, and alternatives regarding colonoscopy have been reviewed with the patient.  Questions have been answered.  All parties agreeable.

## 2019-02-18 NOTE — Anesthesia Preprocedure Evaluation (Signed)
Anesthesia Evaluation  Patient identified by MRN, date of birth, ID band Patient awake    Reviewed: Allergy & Precautions, NPO status , Patient's Chart, lab work & pertinent test results  History of Anesthesia Complications Negative for: history of anesthetic complications  Airway Mallampati: III       Dental  (+) Upper Dentures   Pulmonary neg sleep apnea, COPD,  COPD inhaler, former smoker,           Cardiovascular hypertension, Pt. on medications + CAD, + CABG and + Peripheral Vascular Disease  (-) CHF (-) dysrhythmias (-) Valvular Problems/Murmurs     Neuro/Psych neg Seizures Anxiety Depression CVA (R side weakness, speech difficulties), Residual Symptoms    GI/Hepatic Neg liver ROS, GERD  ,  Endo/Other  diabetes, Type 2, Oral Hypoglycemic AgentsHypothyroidism   Renal/GU negative Renal ROS     Musculoskeletal   Abdominal   Peds  Hematology   Anesthesia Other Findings   Reproductive/Obstetrics                             Anesthesia Physical  Anesthesia Plan  ASA: III  Anesthesia Plan: General   Post-op Pain Management:    Induction: Intravenous  PONV Risk Score and Plan: 2 and TIVA and Propofol infusion  Airway Management Planned: Nasal Cannula and Natural Airway  Additional Equipment:   Intra-op Plan:   Post-operative Plan:   Informed Consent: I have reviewed the patients History and Physical, chart, labs and discussed the procedure including the risks, benefits and alternatives for the proposed anesthesia with the patient or authorized representative who has indicated his/her understanding and acceptance.       Plan Discussed with:   Anesthesia Plan Comments:         Anesthesia Quick Evaluation

## 2019-02-18 NOTE — Transfer of Care (Signed)
Immediate Anesthesia Transfer of Care Note  Patient: [name]  Procedure(s) Performed: COLONOSCOPY WITH PROPOFOL (N/A )  Patient Location: Endoscopy Unit  Anesthesia Type:General  Level of Consciousness: drowsy and patient cooperative  Airway & Oxygen Therapy: Patient Spontanous Breathing and Patient connected to nasal cannula oxygen  Post-op Assessment: Report given to RN and Post -op Vital signs reviewed and stable  Post vital signs: Reviewed and stable  Last Vitals:  Vitals Value Taken Time  BP 98/57 02/18/19 0939  Temp 36.4 C 02/18/19 0938  Pulse 49 02/18/19 0939  Resp 16 02/18/19 0939  SpO2 97 % 02/18/19 0939  Vitals shown include unvalidated device data.  Last Pain:  Vitals:   02/18/19 0938  TempSrc: Tympanic  PainSc: Asleep         Complications: No apparent anesthesia complications

## 2019-02-18 NOTE — Anesthesia Post-op Follow-up Note (Signed)
Anesthesia QCDR form completed.        

## 2019-02-18 NOTE — Anesthesia Postprocedure Evaluation (Signed)
Anesthesia Post Note  Patient: Paul Bass  Procedure(s) Performed: COLONOSCOPY WITH PROPOFOL (N/A )  Patient location during evaluation: Endoscopy Anesthesia Type: General Level of consciousness: awake and alert Pain management: pain level controlled Vital Signs Assessment: post-procedure vital signs reviewed and stable Respiratory status: spontaneous breathing, nonlabored ventilation, respiratory function stable and patient connected to nasal cannula oxygen Cardiovascular status: blood pressure returned to baseline and stable Postop Assessment: no apparent nausea or vomiting Anesthetic complications: no     Last Vitals:  Vitals:   02/18/19 0948 02/18/19 0958  BP: (!) 108/57 123/67  Pulse: (!) 47 (!) 46  Resp: 14 15  Temp:    SpO2: 98% 98%    Last Pain:  Vitals:   02/18/19 1008  TempSrc:   PainSc: 0-No pain                 Martha Clan

## 2019-02-18 NOTE — Op Note (Signed)
[facility] Gastroenterology Patient Name: Paul Bass Procedure Date: 02/18/2019 9:04 AM MRN: [id number] Account #: [id number] Date of Birth: [birth year] Admit Type: Outpatient Age: 73 Room: [facility] ENDO ROOM 4 Gender: Male Note Status: Finalized Procedure:            Colonoscopy Indications:          Screening for colorectal malignant neoplasm Providers:            Lucilla Lame MD, MD Referring MD:         Juluis Rainier (Referring MD) Medicines:            Propofol per Anesthesia Complications:        No immediate complications. Procedure:            Pre-Anesthesia Assessment:                       - Prior to the procedure, a History and Physical was                        performed, and patient medications and allergies were                        reviewed. The patient's tolerance of previous                        anesthesia was also reviewed. The risks and benefits of                        the procedure and the sedation options and risks were                        discussed with the patient. All questions were                        answered, and informed consent was obtained. Prior                        Anticoagulants: The patient has taken no previous                        anticoagulant or antiplatelet agents. ASA Grade                        Assessment: II - A patient with mild systemic disease.                        After reviewing the risks and benefits, the patient was                        deemed in satisfactory condition to undergo the                        procedure.                       After obtaining informed consent, the colonoscope was                        passed under direct vision. Throughout the procedure,  the patient's blood pressure, pulse, and oxygen                        saturations were monitored continuously. The                        Colonoscope was introduced through the anus and    advanced to the the cecum, identified by appendiceal                        orifice and ileocecal valve. The colonoscopy was                        performed without difficulty. The patient tolerated the                        procedure well. The quality of the bowel preparation                        was excellent. Findings:      The perianal and digital rectal examinations were normal.      Two sessile polyps were found in the cecum. The polyps were 4 to 5 mm in       size. These polyps were removed with a cold snare. Resection and       retrieval were complete.      A 3 mm polyp was found in the ascending colon. The polyp was sessile.       The polyp was removed with a cold snare. Resection and retrieval were       complete.      Two sessile polyps were found in the transverse colon. The polyps were 4       to 6 mm in size. These polyps were removed with a cold snare. Resection       and retrieval were complete.      A 7 mm polyp was found in the sigmoid colon. The polyp was pedunculated.       The polyp was removed with a cold snare. Resection and retrieval were       complete. To prevent bleeding post-intervention, one hemostatic clip was       successfully placed (MR conditional). There was no bleeding at the end       of the procedure.      Multiple small-mouthed diverticula were found in the sigmoid colon.      Non-bleeding internal hemorrhoids were found during retroflexion. The       hemorrhoids were Grade II (internal hemorrhoids that prolapse but reduce       spontaneously). Impression:           - Two 4 to 5 mm polyps in the cecum, removed with a                        cold snare. Resected and retrieved.                       - One 3 mm polyp in the ascending colon, removed with a                        cold snare. Resected and retrieved.                       -  Two 4 to 6 mm polyps in the transverse colon, removed                        with a cold snare. Resected and  retrieved.                       - One 7 mm polyp in the sigmoid colon, removed with a                        cold snare. Resected and retrieved. Clip (MR                        conditional) was placed.                       - Diverticulosis in the sigmoid colon.                       - Non-bleeding internal hemorrhoids. Recommendation:       - Discharge patient to home.                       - Resume previous diet.                       - Continue present medications.                       - Await pathology results. Procedure Code(s):    --- Professional ---                       [phone], Colonoscopy, flexible; with removal of tumor(s),                        polyp(s), or other lesion(s) by snare technique Diagnosis Code(s):    --- Professional ---                       Z12.11, Encounter for screening for malignant neoplasm                        of colon                       K63.5, Polyp of colon CPT copyright 2019 American Medical Association. All rights reserved. The codes documented in this report are preliminary and upon coder review may  be revised to meet current compliance requirements. Lucilla Lame MD, MD 02/18/2019 9:35:12 AM This report has been signed electronically. Number of Addenda: 0 Note Initiated On: 02/18/2019 9:04 AM Scope Withdrawal Time: 0 hours 7 minutes 43 seconds  Total Procedure Duration: 0 hours 14 minutes 59 seconds  Estimated Blood Loss: Estimated blood loss: none.      [facility]

## 2019-02-19 ENCOUNTER — Encounter: Payer: Self-pay | Admitting: Gastroenterology

## 2019-02-19 LAB — SURGICAL PATHOLOGY

## 2019-02-21 ENCOUNTER — Encounter: Payer: Self-pay | Admitting: Gastroenterology

## 2019-03-24 ENCOUNTER — Other Ambulatory Visit: Payer: Medicare HMO | Admitting: Urology

## 2019-03-24 ENCOUNTER — Ambulatory Visit (INDEPENDENT_AMBULATORY_CARE_PROVIDER_SITE_OTHER): Payer: Medicare HMO | Admitting: Urology

## 2019-03-24 ENCOUNTER — Encounter: Payer: Self-pay | Admitting: Urology

## 2019-03-24 ENCOUNTER — Other Ambulatory Visit: Payer: Self-pay

## 2019-03-24 VITALS — BP 117/60 | HR 80 | Ht 71.0 in | Wt 235.0 lb

## 2019-03-24 DIAGNOSIS — R3914 Feeling of incomplete bladder emptying: Secondary | ICD-10-CM

## 2019-03-24 DIAGNOSIS — N401 Enlarged prostate with lower urinary tract symptoms: Secondary | ICD-10-CM

## 2019-03-24 DIAGNOSIS — N210 Calculus in bladder: Secondary | ICD-10-CM | POA: Diagnosis not present

## 2019-03-24 NOTE — Progress Notes (Signed)
   03/24/19  CC:  Chief Complaint  Patient presents with  . Cysto   Indications: 1.  BPH with lower urinary tract symptoms 2.  Possible bladder calculus  HPI: 73 year old male with BPH and incomplete bladder emptying.  KUB 1 year ago with Dr. Jacqlyn Larsen suggested a possible bladder calculus.  Renal ultrasound showed a possible 7 mm bladder calculus.  Blood pressure 117/60, pulse 80, height 5\' 11"  (1.803 m), weight 235 lb (106.6 kg). NED. A&Ox3.   No respiratory distress   Abd soft, NT, ND Normal phallus with bilateral descended testicles  Cystoscopy Procedure Note  Patient identification was confirmed, informed consent was obtained, and patient was prepped using Betadine solution.  Lidocaine jelly was administered per urethral meatus.     Pre-Procedure: - Inspection reveals a normal caliber ureteral meatus.  Procedure: The flexible cystoscope was introduced without difficulty - No urethral strictures/lesions are present. - Lateral lobes almost touching prostate  - Mild elevation bladder neck - Bilateral ureteral orifices identified - Bladder mucosa  reveals no ulcers, tumors, or lesions -<1 cm bladder calculus.  Was unable to grasp with endoscopic forceps - No trabeculation  Retroflexion shows no intravesical median lobe   Post-Procedure: - Patient tolerated the procedure well  Assessment/ Plan: 1.  BPH with lower urinary tract symptoms 2.  Bladder calculus  Would recommend scheduling bladder calculus removal and based on his BPH and incomplete bladder emptying and outlet procedure in addition.  We discussed options including TURP, PVP and he would be a candidate for UroLift.  The alternative of bladder calculus removal without an outlet procedure was discussed although his urinary symptoms would not improve and he could potentially develop bladder hypotonicity if this has not already occurred.  He would like to think over these options and indicated he would call back  with his decision.   Abbie Sons, MD

## 2019-03-25 LAB — URINALYSIS, COMPLETE
Bilirubin, UA: NEGATIVE
Glucose, UA: NEGATIVE
Ketones, UA: NEGATIVE
Leukocytes,UA: NEGATIVE
Nitrite, UA: NEGATIVE
Protein,UA: NEGATIVE
RBC, UA: NEGATIVE
Specific Gravity, UA: 1.02 (ref 1.005–1.030)
Urobilinogen, Ur: 0.2 mg/dL (ref 0.2–1.0)
pH, UA: 5.5 (ref 5.0–7.5)

## 2019-03-25 LAB — MICROSCOPIC EXAMINATION
Bacteria, UA: NONE SEEN
RBC, Urine: NONE SEEN /hpf (ref 0–2)

## 2019-04-01 ENCOUNTER — Telehealth: Payer: Self-pay | Admitting: Urology

## 2019-04-01 NOTE — Telephone Encounter (Signed)
Pt's wife, Vaughan Basta, called asking about pt's surgery Urolift procedure.  Please give her a call.

## 2019-04-08 NOTE — Telephone Encounter (Signed)
I talked to the patient about bladder calculus removal and various outlet procedures including TURP and UroLift.  He did not want to schedule anything at that visit and indicated he would call back if he desired to proceed.

## 2019-04-11 NOTE — Telephone Encounter (Signed)
Pt.'s wife called and he has decided to proceed with Urolift. Patient is scheduled to come to office for a ucx on 04/21/19. We will schedule Urolift on 04/29/19.

## 2019-04-12 NOTE — Telephone Encounter (Signed)
-  I put the scheduling sheet on your desk  -He will need to hold his Plavix preop so I guess we need to get clearance on that  -Set up a telephone visit so that I can discuss the procedure and risks

## 2019-04-17 ENCOUNTER — Other Ambulatory Visit: Payer: Self-pay

## 2019-04-17 ENCOUNTER — Telehealth (INDEPENDENT_AMBULATORY_CARE_PROVIDER_SITE_OTHER): Payer: Medicare HMO | Admitting: Urology

## 2019-04-17 DIAGNOSIS — N401 Enlarged prostate with lower urinary tract symptoms: Secondary | ICD-10-CM

## 2019-04-17 DIAGNOSIS — R3914 Feeling of incomplete bladder emptying: Secondary | ICD-10-CM

## 2019-04-17 DIAGNOSIS — N210 Calculus in bladder: Secondary | ICD-10-CM

## 2019-04-18 ENCOUNTER — Other Ambulatory Visit: Payer: Self-pay | Admitting: Family Medicine

## 2019-04-18 DIAGNOSIS — R3914 Feeling of incomplete bladder emptying: Secondary | ICD-10-CM

## 2019-04-18 DIAGNOSIS — N401 Enlarged prostate with lower urinary tract symptoms: Secondary | ICD-10-CM

## 2019-04-18 DIAGNOSIS — N210 Calculus in bladder: Secondary | ICD-10-CM

## 2019-04-18 NOTE — H&P (View-Only) (Signed)
Virtual Visit via Telephone Note  I connected with Paul Bass on 04/17/2019 at  8:00 AM EDT by telephone and verified that I am speaking with the correct person using two identifiers.  Location: Patient: Home Provider: Work office   I discussed the limitations, risks, security and privacy concerns of performing an evaluation and management service by telephone and the availability of in person appointments. I also discussed with the patient that there may be a patient responsible charge related to this service. The patient expressed understanding and agreed to proceed.   History of Present Illness: 73 y.o. male with BPH and residuals of <200 mL.  He also has a <1 cm bladder calculus.  Severe lower urinary tract symptoms with IPSS of 21.  He is scheduled for UroLift and bladder calculus removal next week.  Preoperative visit today to discuss the procedure.  His symptoms include urinary hesitancy, decreased stream, frequency, urgency and nocturia x3-4.  He is presently on tamsulosin.  No improvement in his storage related symptoms with Myrbetriq.   PMH:     Past Medical History:  Diagnosis Date  . Anxiety   . BPH (benign prostatic hypertrophy)   . CAD S/P percutaneous coronary angioplasty   . COPD (chronic obstructive pulmonary disease) ([place])   . Depression   . Diabetes mellitus without complication ([place])   . HTN (hypertension)   . Hyperlipidemia   . Hypothyroidism   . Stroke ([place])   . Tobacco abuse     Surgical History:      Past Surgical History:  Procedure Laterality Date  . CARDIAC CATHETERIZATION N/A 12/07/2015   Procedure: Left Heart Cath and Coronary Angiography;  Surgeon: Corey Skains, MD;  Location: [place] [facility];  Service: Cardiovascular;  Laterality: N/A;  . 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 ESOPHAGOGASTRODUODENOSCOPY (EGD) WITH PROPOFOL N/A 09/11/2016   Procedure: ESOPHAGOGASTRODUODENOSCOPY  (EGD) WITH PROPOFOL;  Surgeon: Lucilla Lame, MD;  Location: [facility];  Service: Endoscopy;  Laterality: N/A;  . ESOPHAGOGASTRODUODENOSCOPY (EGD) WITH PROPOFOL N/A 10/17/2016   Procedure: ESOPHAGOGASTRODUODENOSCOPY (EGD) WITH PROPOFOL;  Surgeon: Lucilla Lame, MD;  Location: [facility];  Service: Endoscopy;  Laterality: N/A;  . ESOPHAGOGASTRODUODENOSCOPY (EGD) WITH PROPOFOL N/A 11/13/2017   Procedure: ESOPHAGOGASTRODUODENOSCOPY (EGD) WITH PROPOFOL;  Surgeon: Lucilla Lame, MD;  Location: [facility];  Service: Endoscopy;  Laterality: N/A;  . pins R lower leg    . rotator cuff replaced     right  . SAVORY DILATION N/A 10/17/2016   Procedure: SAVORY DILATION;  Surgeon: Lucilla Lame, MD;  Location: [facility];  Service: Endoscopy;  Laterality: N/A;    Home Medications:  Allergies as of 01/14/2019   No Known Allergies        Medication List       Accurate as of January 14, 2019 12:07 PM. If you have any questions, ask your nurse or doctor.        albuterol 108 (90 Base) MCG/ACT inhaler Commonly known as: VENTOLIN HFA Inhale 2 puffs into the lungs every 6 (six) hours as needed for wheezing or shortness of breath. For shortness of breath   aspirin EC 81 MG tablet Take 81 mg by mouth daily.   atorvastatin 80 MG tablet Commonly known as: LIPITOR Take 80 mg by mouth.   budesonide-formoterol 160-4.5 MCG/ACT inhaler Commonly known as: SYMBICORT Inhale 2 puffs into the lungs 2 (two) times daily.   buPROPion 300 MG 24 hr tablet  Commonly known as: WELLBUTRIN XL   cholecalciferol 10 MCG (400 UNIT) Tabs tablet Commonly known as: VITAMIN D3 Take 400 Units by mouth 2 (two) times daily.   Cinnamon Bark Powd Take by mouth.   citalopram 20 MG tablet Commonly known as: CELEXA Take 20 mg by mouth daily.   clonazePAM 0.5 MG tablet Commonly known as: KLONOPIN Take 0.5 mg by mouth 2 (two) times daily as needed for anxiety.   clopidogrel 75 MG tablet  Commonly known as: PLAVIX TAKE 1 TABLET (75 MG TOTAL) BY MOUTH ONCE DAILY.   Co Q-10 200 MG Caps Take 1 capsule by mouth daily.   Fish Oil 1200 MG Caps Take 1,000 mg by mouth 2 (two) times daily.   fluticasone 50 MCG/ACT nasal spray Commonly known as: FLONASE Place into the nose.   glucose blood test strip   levothyroxine 100 MCG tablet Commonly known as: SYNTHROID Take 100 mcg by mouth daily before breakfast.   losartan 100 MG tablet Commonly known as: COZAAR Take 50 mg by mouth daily.   metFORMIN 500 MG tablet Commonly known as: GLUCOPHAGE TAKE 1 TABLET BY MOUTH ONCE A DAY   Multi-Vitamin tablet Take by mouth.   multivitamin with minerals Tabs tablet Take 0.5 tablets by mouth 2 (two) times daily.   pantoprazole 40 MG tablet Commonly known as: PROTONIX TAKE 1 TABLET BY MOUTH EVERY DAY   PSYLLIUM PO Take by mouth.   RA Krill Oil 500 MG Caps Take by mouth.   ranitidine 300 MG tablet Commonly known as: ZANTAC Take 300 mg by mouth at bedtime.   tamsulosin 0.4 MG Caps capsule Commonly known as: FLOMAX Take 0.4 mg by mouth daily.       Allergies: No Known Allergies  Family History: Family History  Problem Relation Age of Onset  . Heart failure Father   . Throat cancer Mother   . Diabetes Brother     Social History:  reports that he quit smoking about 6 years ago. His smoking use included cigarettes. He smoked 2.00 packs per day. He has never used smokeless tobacco. He reports that he does not drink alcohol or use drugs.  ROS: UROLOGY Frequent Urination?: Yes Hard to postpone urination?: Yes Burning/pain with urination?: No Get up at night to urinate?: Yes Leakage of urine?: Yes Urine stream starts and stops?: No Trouble starting stream?: No Do you have to strain to urinate?: No Blood in urine?: No Urinary tract infection?: No Sexually transmitted disease?: No Injury to kidneys or bladder?: No Painful intercourse?: No  Weak stream?: Yes Erection problems?: Yes Penile pain?: No  Gastrointestinal Nausea?: No Vomiting?: No Indigestion/heartburn?: Yes Diarrhea?: No Constipation?: No  Constitutional Fever: No Night sweats?: No Weight loss?: No Fatigue?: Yes  Skin Skin rash/lesions?: No Itching?: No  Eyes Blurred vision?: No Double vision?: No  Ears/Nose/Throat Sore throat?: No Sinus problems?: No  Hematologic/Lymphatic Swollen glands?: No Easy bruising?: No  Cardiovascular Leg swelling?: No Chest pain?: No  Respiratory Cough?: No Shortness of breath?: No  Endocrine Excessive thirst?: No  Musculoskeletal Back pain?: No Joint pain?: No  Neurological Headaches?: No Dizziness?: Yes  Psychologic Depression?: No Anxiety?: No    Observations/Objective: Alert, in no acute distress  Assessment and Plan: 73 y.o. male with severe lower urinary tract symptoms and a bladder calculus.  He is scheduled for cystoscopy with calculus removal and UroLift.  The UroLift procedure was discussed in detail including the most common side effects of frequency, urgency and dysuria typically lasting  2-4 weeks.  It was stressed there is no guarantee this procedure will improve or resolve his lower urinary tract symptoms especially his nocturia.  The postoperative care and follow-up was discussed.  He indicated all questions were answered to his satisfaction and desires to proceed.  Follow Up Instructions: As above   I discussed the assessment and treatment plan with the patient. The patient was provided an opportunity to ask questions and all were answered. The patient agreed with the plan and demonstrated an understanding of the instructions.   The patient was advised to call back or seek an in-person evaluation if the symptoms worsen or if the condition fails to improve as anticipated.  I provided 15 minutes of non-face-to-face time during this encounter.   Abbie Sons, MD

## 2019-04-18 NOTE — Progress Notes (Signed)
Virtual Visit via Telephone Note  I connected with Paul Bass on 04/17/2019 at  8:00 AM EDT by telephone and verified that I am speaking with the correct person using two identifiers.  Location: Patient: Home Provider: Work office   I discussed the limitations, risks, security and privacy concerns of performing an evaluation and management service by telephone and the availability of in person appointments. I also discussed with the patient that there may be a patient responsible charge related to this service. The patient expressed understanding and agreed to proceed.   History of Present Illness: 73 y.o. male with BPH and residuals of <200 mL.  He also has a <1 cm bladder calculus.  Severe lower urinary tract symptoms with IPSS of 21.  He is scheduled for UroLift and bladder calculus removal next week.  Preoperative visit today to discuss the procedure.  His symptoms include urinary hesitancy, decreased stream, frequency, urgency and nocturia x3-4.  He is presently on tamsulosin.  No improvement in his storage related symptoms with Myrbetriq.   PMH:     Past Medical History:  Diagnosis Date  . Anxiety   . BPH (benign prostatic hypertrophy)   . CAD S/P percutaneous coronary angioplasty   . COPD (chronic obstructive pulmonary disease) ([place])   . Depression   . Diabetes mellitus without complication ([place])   . HTN (hypertension)   . Hyperlipidemia   . Hypothyroidism   . Stroke ([place])   . Tobacco abuse     Surgical History:      Past Surgical History:  Procedure Laterality Date  . CARDIAC CATHETERIZATION N/A 12/07/2015   Procedure: Left Heart Cath and Coronary Angiography;  Surgeon: Corey Skains, MD;  Location: [place] [facility];  Service: Cardiovascular;  Laterality: N/A;  . 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 ESOPHAGOGASTRODUODENOSCOPY (EGD) WITH PROPOFOL N/A 09/11/2016   Procedure: ESOPHAGOGASTRODUODENOSCOPY  (EGD) WITH PROPOFOL;  Surgeon: Lucilla Lame, MD;  Location: [facility];  Service: Endoscopy;  Laterality: N/A;  . ESOPHAGOGASTRODUODENOSCOPY (EGD) WITH PROPOFOL N/A 10/17/2016   Procedure: ESOPHAGOGASTRODUODENOSCOPY (EGD) WITH PROPOFOL;  Surgeon: Lucilla Lame, MD;  Location: [facility];  Service: Endoscopy;  Laterality: N/A;  . ESOPHAGOGASTRODUODENOSCOPY (EGD) WITH PROPOFOL N/A 11/13/2017   Procedure: ESOPHAGOGASTRODUODENOSCOPY (EGD) WITH PROPOFOL;  Surgeon: Lucilla Lame, MD;  Location: [facility];  Service: Endoscopy;  Laterality: N/A;  . pins R lower leg    . rotator cuff replaced     right  . SAVORY DILATION N/A 10/17/2016   Procedure: SAVORY DILATION;  Surgeon: Lucilla Lame, MD;  Location: [facility];  Service: Endoscopy;  Laterality: N/A;    Home Medications:  Allergies as of 01/14/2019   No Known Allergies        Medication List       Accurate as of January 14, 2019 12:07 PM. If you have any questions, ask your nurse or doctor.        albuterol 108 (90 Base) MCG/ACT inhaler Commonly known as: VENTOLIN HFA Inhale 2 puffs into the lungs every 6 (six) hours as needed for wheezing or shortness of breath. For shortness of breath   aspirin EC 81 MG tablet Take 81 mg by mouth daily.   atorvastatin 80 MG tablet Commonly known as: LIPITOR Take 80 mg by mouth.   budesonide-formoterol 160-4.5 MCG/ACT inhaler Commonly known as: SYMBICORT Inhale 2 puffs into the lungs 2 (two) times daily.   buPROPion 300 MG 24 hr tablet  Commonly known as: WELLBUTRIN XL   cholecalciferol 10 MCG (400 UNIT) Tabs tablet Commonly known as: VITAMIN D3 Take 400 Units by mouth 2 (two) times daily.   Cinnamon Bark Powd Take by mouth.   citalopram 20 MG tablet Commonly known as: CELEXA Take 20 mg by mouth daily.   clonazePAM 0.5 MG tablet Commonly known as: KLONOPIN Take 0.5 mg by mouth 2 (two) times daily as needed for anxiety.   clopidogrel 75 MG tablet  Commonly known as: PLAVIX TAKE 1 TABLET (75 MG TOTAL) BY MOUTH ONCE DAILY.   Co Q-10 200 MG Caps Take 1 capsule by mouth daily.   Fish Oil 1200 MG Caps Take 1,000 mg by mouth 2 (two) times daily.   fluticasone 50 MCG/ACT nasal spray Commonly known as: FLONASE Place into the nose.   glucose blood test strip   levothyroxine 100 MCG tablet Commonly known as: SYNTHROID Take 100 mcg by mouth daily before breakfast.   losartan 100 MG tablet Commonly known as: COZAAR Take 50 mg by mouth daily.   metFORMIN 500 MG tablet Commonly known as: GLUCOPHAGE TAKE 1 TABLET BY MOUTH ONCE A DAY   Multi-Vitamin tablet Take by mouth.   multivitamin with minerals Tabs tablet Take 0.5 tablets by mouth 2 (two) times daily.   pantoprazole 40 MG tablet Commonly known as: PROTONIX TAKE 1 TABLET BY MOUTH EVERY DAY   PSYLLIUM PO Take by mouth.   RA Krill Oil 500 MG Caps Take by mouth.   ranitidine 300 MG tablet Commonly known as: ZANTAC Take 300 mg by mouth at bedtime.   tamsulosin 0.4 MG Caps capsule Commonly known as: FLOMAX Take 0.4 mg by mouth daily.       Allergies: No Known Allergies  Family History: Family History  Problem Relation Age of Onset  . Heart failure Father   . Throat cancer Mother   . Diabetes Brother     Social History:  reports that he quit smoking about 6 years ago. His smoking use included cigarettes. He smoked 2.00 packs per day. He has never used smokeless tobacco. He reports that he does not drink alcohol or use drugs.  ROS: UROLOGY Frequent Urination?: Yes Hard to postpone urination?: Yes Burning/pain with urination?: No Get up at night to urinate?: Yes Leakage of urine?: Yes Urine stream starts and stops?: No Trouble starting stream?: No Do you have to strain to urinate?: No Blood in urine?: No Urinary tract infection?: No Sexually transmitted disease?: No Injury to kidneys or bladder?: No Painful intercourse?: No  Weak stream?: Yes Erection problems?: Yes Penile pain?: No  Gastrointestinal Nausea?: No Vomiting?: No Indigestion/heartburn?: Yes Diarrhea?: No Constipation?: No  Constitutional Fever: No Night sweats?: No Weight loss?: No Fatigue?: Yes  Skin Skin rash/lesions?: No Itching?: No  Eyes Blurred vision?: No Double vision?: No  Ears/Nose/Throat Sore throat?: No Sinus problems?: No  Hematologic/Lymphatic Swollen glands?: No Easy bruising?: No  Cardiovascular Leg swelling?: No Chest pain?: No  Respiratory Cough?: No Shortness of breath?: No  Endocrine Excessive thirst?: No  Musculoskeletal Back pain?: No Joint pain?: No  Neurological Headaches?: No Dizziness?: Yes  Psychologic Depression?: No Anxiety?: No    Observations/Objective: Alert, in no acute distress  Assessment and Plan: 73 y.o. male with severe lower urinary tract symptoms and a bladder calculus.  He is scheduled for cystoscopy with calculus removal and UroLift.  The UroLift procedure was discussed in detail including the most common side effects of frequency, urgency and dysuria typically lasting  2-4 weeks.  It was stressed there is no guarantee this procedure will improve or resolve his lower urinary tract symptoms especially his nocturia.  The postoperative care and follow-up was discussed.  He indicated all questions were answered to his satisfaction and desires to proceed.  Follow Up Instructions: As above   I discussed the assessment and treatment plan with the patient. The patient was provided an opportunity to ask questions and all were answered. The patient agreed with the plan and demonstrated an understanding of the instructions.   The patient was advised to call back or seek an in-person evaluation if the symptoms worsen or if the condition fails to improve as anticipated.  I provided 15 minutes of non-face-to-face time during this encounter.   Abbie Sons, MD

## 2019-04-21 ENCOUNTER — Other Ambulatory Visit: Payer: Self-pay

## 2019-04-21 ENCOUNTER — Other Ambulatory Visit: Payer: Medicare HMO

## 2019-04-21 DIAGNOSIS — R3914 Feeling of incomplete bladder emptying: Secondary | ICD-10-CM

## 2019-04-21 DIAGNOSIS — I498 Other specified cardiac arrhythmias: Secondary | ICD-10-CM | POA: Insufficient documentation

## 2019-04-21 DIAGNOSIS — R9431 Abnormal electrocardiogram [ECG] [EKG]: Secondary | ICD-10-CM | POA: Insufficient documentation

## 2019-04-21 DIAGNOSIS — N401 Enlarged prostate with lower urinary tract symptoms: Secondary | ICD-10-CM

## 2019-04-21 DIAGNOSIS — N210 Calculus in bladder: Secondary | ICD-10-CM

## 2019-04-21 LAB — URINALYSIS, COMPLETE
Bilirubin, UA: NEGATIVE
Glucose, UA: NEGATIVE
Ketones, UA: NEGATIVE
Leukocytes,UA: NEGATIVE
Nitrite, UA: NEGATIVE
Protein,UA: NEGATIVE
Specific Gravity, UA: 1.025 (ref 1.005–1.030)
Urobilinogen, Ur: 0.2 mg/dL (ref 0.2–1.0)
pH, UA: 6 (ref 5.0–7.5)

## 2019-04-21 LAB — MICROSCOPIC EXAMINATION

## 2019-04-23 ENCOUNTER — Encounter
Admission: RE | Admit: 2019-04-23 | Discharge: 2019-04-23 | Disposition: A | Discharge status: Home / Self Care | Payer: Medicare HMO | Source: Ambulatory Visit | Attending: Urology | Admitting: Urology

## 2019-04-23 ENCOUNTER — Other Ambulatory Visit: Payer: Self-pay

## 2019-04-23 DIAGNOSIS — I10 Essential (primary) hypertension: Secondary | ICD-10-CM | POA: Diagnosis not present

## 2019-04-23 DIAGNOSIS — Z01812 Encounter for preprocedural laboratory examination: Secondary | ICD-10-CM | POA: Insufficient documentation

## 2019-04-23 HISTORY — DX: Gastro-esophageal reflux disease without esophagitis: K21.9

## 2019-04-23 HISTORY — DX: Dyspnea, unspecified: R06.00

## 2019-04-23 NOTE — Patient Instructions (Signed)
Your procedure is scheduled on: 04/29/2019 Tues Report to Same Day Surgery 2nd floor medical mall [facility] Entrance-take elevator on left to 2nd floor.  Check in with surgery information desk.) To find out your arrival time please call [phone] between 1PM - 3PM on 04/28/2019 Mon  Remember: Instructions that are not followed completely may result in serious medical risk, up to and including death, or upon the discretion of your surgeon and anesthesiologist your surgery may need to be rescheduled.    _x___ 1. Do not eat food after midnight the night before your procedure. You may drink clear liquids up to 2 hours before you are scheduled to arrive at the hospital for your procedure.  Do not drink clear liquids within 2 hours of your scheduled arrival to the hospital.  Clear liquids include  --Water or Apple juice without pulp  --Clear carbohydrate beverage such as ClearFast or Gatorade  --Black Coffee or Clear Tea (No milk, no creamers, do not add anything to                  the coffee or Tea Type 1 and type 2 diabetics should only drink water.   ____Ensure clear carbohydrate drink on the way to the hospital for bariatric patients  ____Ensure clear carbohydrate drink 3 hours before surgery.   No gum chewing or hard candies.     __x__ 2. No Alcohol for 24 hours before or after surgery.   __x__3. No Smoking or e-cigarettes for 24 prior to surgery.  Do not use any chewable tobacco products for at least 6 hour prior to surgery   ____  4. Bring all medications with you on the day of surgery if instructed.    __x__ 5. Notify your doctor if there is any change in your medical condition     (cold, fever, infections).    x___6. On the morning of surgery brush your teeth with toothpaste and water.  You may rinse your mouth with mouth wash if you wish.  Do not swallow any toothpaste or mouthwash.   Do not wear jewelry, make-up, hairpins, clips or nail polish.  Do not wear lotions,  powders, or perfumes. You may wear deodorant.  Do not shave 48 hours prior to surgery. Men may shave face and neck.  Do not bring valuables to the hospital.    [facility] is not responsible for any belongings or valuables.               Contacts, dentures or bridgework may not be worn into surgery.  Leave your suitcase in the car. After surgery it may be brought to your room.  For patients admitted to the hospital, discharge time is determined by your                       treatment team.  _  Patients discharged the day of surgery will not be allowed to drive home.  You will need someone to drive you home and stay with you the night of your procedure.    Please read over the following fact sheets that you were given:   [facility] Preparing for Surgery and or MRSA Information   _x___ Take anti-hypertensive listed below, cardiac, seizure, asthma,     anti-reflux and psychiatric medicines. These include:  1. albuterol (PROVENTIL HFA;VENTOLIN HFA) 108 (90 BASE) MCG/ACT inhaler use am of surgery and bring to hospital  2.atorvastatin (LIPITOR) 80 MG tablet  3.budesonide-formoterol (SYMBICORT)  160-4.5 MCG/ACT inhaler  4.citalopram (CELEXA) 20 MG tablet  5.levothyroxine (SYNTHROID, LEVOTHROID) 100 MCG tablet  6.pantoprazole (PROTONIX) 40 MG tablet  7.tamsulosin (FLOMAX) 0.4 MG CAPS capsule  ____Fleets enema or Magnesium Citrate as directed.   _x___ Use CHG Soap or sage wipes as directed on instruction sheet   ____ Use inhalers on the day of surgery and bring to hospital day of surgery  __x__ Stop Metformin and Janumet 2 days prior to surgery.    ____ Take 1/2 of usual insulin dose the night before surgery and none on the morning     surgery.   _x___ Follow recommendations from Cardiologist, Pulmonologist or PCP regarding          stopping Aspirin, Coumadin, Plavix ,Eliquis, Effient, or Pradaxa, and Pletal. Stop Plavix 4 days before surgery per physician instruction  X____Stop  Anti-inflammatories such as Advil, Aleve, Ibuprofen, Motrin, Naproxen, Naprosyn, Goodies powders or aspirin products. OK to take Tylenol and                          Celebrex.   _x___ Stop supplements until after surgery.  But may continue Vitamin D, Vitamin B,       and multivitamin.   ____ Bring C-Pap to the hospital.

## 2019-04-24 LAB — CULTURE, URINE COMPREHENSIVE

## 2019-04-25 ENCOUNTER — Other Ambulatory Visit
Admission: RE | Admit: 2019-04-25 | Discharge: 2019-04-25 | Disposition: A | Discharge status: Home / Self Care | Payer: Medicare HMO | Source: Ambulatory Visit | Attending: Urology | Admitting: Urology

## 2019-04-25 DIAGNOSIS — Z01812 Encounter for preprocedural laboratory examination: Secondary | ICD-10-CM | POA: Insufficient documentation

## 2019-04-25 DIAGNOSIS — Z20828 Contact with and (suspected) exposure to other viral communicable diseases: Secondary | ICD-10-CM | POA: Diagnosis not present

## 2019-04-25 LAB — URINE CULTURE: Culture: >=100000 — AB

## 2019-04-26 LAB — SARS CORONAVIRUS 2 (TAT 6-24 HRS): SARS Coronavirus 2: NEGATIVE

## 2019-04-27 ENCOUNTER — Other Ambulatory Visit: Payer: Self-pay | Admitting: Urology

## 2019-04-27 MED ORDER — SULFAMETHOXAZOLE-TRIMETHOPRIM 800-160 MG PO TABS: 1.0000 | ORAL_TABLET | Freq: Two times a day (BID) | ORAL | 0 refills | Status: AC

## 2019-04-29 ENCOUNTER — Other Ambulatory Visit: Payer: Self-pay

## 2019-04-29 ENCOUNTER — Ambulatory Visit: Payer: Medicare HMO | Admitting: Certified Registered Nurse Anesthetist

## 2019-04-29 ENCOUNTER — Ambulatory Visit
Admission: RE | Admit: 2019-04-29 | Discharge: 2019-04-29 | Disposition: A | Discharge status: Home / Self Care | Payer: Medicare HMO | Attending: Urology | Admitting: Urology

## 2019-04-29 ENCOUNTER — Encounter
Admission: RE | Disposition: A | Discharge status: Home / Self Care | Payer: Self-pay | Source: Home / Self Care | Attending: Urology

## 2019-04-29 ENCOUNTER — Encounter: Payer: Self-pay | Admitting: Emergency Medicine

## 2019-04-29 ENCOUNTER — Emergency Department
Admission: EM | Admit: 2019-04-29 | Discharge: 2019-04-30 | Disposition: A | Discharge status: Home / Self Care | Payer: Medicare HMO | Source: Home / Self Care | Attending: Emergency Medicine | Admitting: Emergency Medicine

## 2019-04-29 DIAGNOSIS — Z8673 Personal history of transient ischemic attack (TIA), and cerebral infarction without residual deficits: Secondary | ICD-10-CM | POA: Insufficient documentation

## 2019-04-29 DIAGNOSIS — Z79899 Other long term (current) drug therapy: Secondary | ICD-10-CM | POA: Insufficient documentation

## 2019-04-29 DIAGNOSIS — Z951 Presence of aortocoronary bypass graft: Secondary | ICD-10-CM | POA: Insufficient documentation

## 2019-04-29 DIAGNOSIS — K219 Gastro-esophageal reflux disease without esophagitis: Secondary | ICD-10-CM | POA: Insufficient documentation

## 2019-04-29 DIAGNOSIS — I10 Essential (primary) hypertension: Secondary | ICD-10-CM | POA: Insufficient documentation

## 2019-04-29 DIAGNOSIS — E119 Type 2 diabetes mellitus without complications: Secondary | ICD-10-CM | POA: Insufficient documentation

## 2019-04-29 DIAGNOSIS — F329 Major depressive disorder, single episode, unspecified: Secondary | ICD-10-CM | POA: Diagnosis not present

## 2019-04-29 DIAGNOSIS — E1151 Type 2 diabetes mellitus with diabetic peripheral angiopathy without gangrene: Secondary | ICD-10-CM | POA: Diagnosis not present

## 2019-04-29 DIAGNOSIS — R339 Retention of urine, unspecified: Secondary | ICD-10-CM | POA: Insufficient documentation

## 2019-04-29 DIAGNOSIS — N401 Enlarged prostate with lower urinary tract symptoms: Secondary | ICD-10-CM

## 2019-04-29 DIAGNOSIS — I69351 Hemiplegia and hemiparesis following cerebral infarction affecting right dominant side: Secondary | ICD-10-CM | POA: Diagnosis not present

## 2019-04-29 DIAGNOSIS — J449 Chronic obstructive pulmonary disease, unspecified: Secondary | ICD-10-CM | POA: Insufficient documentation

## 2019-04-29 DIAGNOSIS — E039 Hypothyroidism, unspecified: Secondary | ICD-10-CM | POA: Insufficient documentation

## 2019-04-29 DIAGNOSIS — Z7901 Long term (current) use of anticoagulants: Secondary | ICD-10-CM | POA: Insufficient documentation

## 2019-04-29 DIAGNOSIS — F419 Anxiety disorder, unspecified: Secondary | ICD-10-CM | POA: Diagnosis not present

## 2019-04-29 DIAGNOSIS — I2510 Atherosclerotic heart disease of native coronary artery without angina pectoris: Secondary | ICD-10-CM | POA: Insufficient documentation

## 2019-04-29 DIAGNOSIS — N210 Calculus in bladder: Secondary | ICD-10-CM | POA: Insufficient documentation

## 2019-04-29 DIAGNOSIS — Z79890 Hormone replacement therapy (postmenopausal): Secondary | ICD-10-CM | POA: Diagnosis not present

## 2019-04-29 DIAGNOSIS — E785 Hyperlipidemia, unspecified: Secondary | ICD-10-CM | POA: Diagnosis not present

## 2019-04-29 DIAGNOSIS — N138 Other obstructive and reflux uropathy: Secondary | ICD-10-CM | POA: Insufficient documentation

## 2019-04-29 DIAGNOSIS — Z7982 Long term (current) use of aspirin: Secondary | ICD-10-CM | POA: Diagnosis not present

## 2019-04-29 DIAGNOSIS — Z87891 Personal history of nicotine dependence: Secondary | ICD-10-CM | POA: Insufficient documentation

## 2019-04-29 DIAGNOSIS — R3914 Feeling of incomplete bladder emptying: Secondary | ICD-10-CM

## 2019-04-29 DIAGNOSIS — R3915 Urgency of urination: Secondary | ICD-10-CM

## 2019-04-29 DIAGNOSIS — Z7984 Long term (current) use of oral hypoglycemic drugs: Secondary | ICD-10-CM | POA: Insufficient documentation

## 2019-04-29 DIAGNOSIS — Z9861 Coronary angioplasty status: Secondary | ICD-10-CM | POA: Insufficient documentation

## 2019-04-29 DIAGNOSIS — I69328 Other speech and language deficits following cerebral infarction: Secondary | ICD-10-CM | POA: Insufficient documentation

## 2019-04-29 HISTORY — PX: CYSTOSCOPY WITH INSERTION OF UROLIFT: SHX6678

## 2019-04-29 LAB — GLUCOSE, CAPILLARY
Glucose-Capillary: 168 mg/dL — ABNORMAL HIGH (ref 70–99)
Glucose-Capillary: 137 mg/dL — ABNORMAL HIGH (ref 70–99)

## 2019-04-29 SURGERY — CYSTOSCOPY WITH INSERTION OF UROLIFT
Anesthesia: General

## 2019-04-29 MED ORDER — FENTANYL CITRATE (PF) 100 MCG/2ML IJ SOLN
INTRAMUSCULAR | Status: AC
  Filled 2019-04-29: qty 2

## 2019-04-29 MED ORDER — LIDOCAINE HCL URETHRAL/MUCOSAL 2 % EX GEL
1.0000 "application " | Freq: Once | CUTANEOUS | Status: DC
  Filled 2019-04-29: qty 10

## 2019-04-29 MED ORDER — LIDOCAINE HCL URETHRAL/MUCOSAL 2 % EX GEL
1.0000 "application " | Freq: Once | CUTANEOUS | Status: AC
  Administered 2019-04-29: 1 via URETHRAL

## 2019-04-29 MED ORDER — FENTANYL CITRATE (PF) 100 MCG/2ML IJ SOLN
INTRAMUSCULAR | Status: DC | PRN
  Administered 2019-04-29 (×2): 50 ug via INTRAVENOUS

## 2019-04-29 MED ORDER — SODIUM CHLORIDE 0.9 % IV SOLN
INTRAVENOUS | Status: DC
  Administered 2019-04-29: 11:00:00 via INTRAVENOUS

## 2019-04-29 MED ORDER — ONDANSETRON HCL 4 MG/2ML IJ SOLN
INTRAMUSCULAR | Status: DC | PRN
  Administered 2019-04-29: 4 mg via INTRAVENOUS

## 2019-04-29 MED ORDER — PROPOFOL 10 MG/ML IV BOLUS
INTRAVENOUS | Status: DC | PRN
  Administered 2019-04-29: 150 mg via INTRAVENOUS
  Administered 2019-04-29: 50 mg via INTRAVENOUS

## 2019-04-29 MED ORDER — CEFAZOLIN SODIUM-DEXTROSE 2-4 GM/100ML-% IV SOLN
2.0000 g | Freq: Once | INTRAVENOUS | Status: AC
  Administered 2019-04-29: 2 g via INTRAVENOUS

## 2019-04-29 MED ORDER — EPHEDRINE SULFATE 50 MG/ML IJ SOLN
INTRAMUSCULAR | Status: DC | PRN
  Administered 2019-04-29: 10 mg via INTRAVENOUS
  Administered 2019-04-29: 15 mg via INTRAVENOUS
  Administered 2019-04-29 (×2): 10 mg via INTRAVENOUS

## 2019-04-29 MED ORDER — DEXAMETHASONE SODIUM PHOSPHATE 10 MG/ML IJ SOLN
INTRAMUSCULAR | Status: DC | PRN
  Administered 2019-04-29: 10 mg via INTRAVENOUS

## 2019-04-29 MED ORDER — FENTANYL CITRATE (PF) 100 MCG/2ML IJ SOLN: 25.0000 ug | INTRAMUSCULAR | Status: DC | PRN

## 2019-04-29 MED ORDER — PROPOFOL 10 MG/ML IV BOLUS
INTRAVENOUS | Status: AC
  Filled 2019-04-29: qty 20

## 2019-04-29 MED ORDER — PHENYLEPHRINE HCL (PRESSORS) 10 MG/ML IV SOLN
INTRAVENOUS | Status: DC | PRN
  Administered 2019-04-29: 50 ug via INTRAVENOUS
  Administered 2019-04-29: 100 ug via INTRAVENOUS

## 2019-04-29 MED ORDER — DEXAMETHASONE SODIUM PHOSPHATE 10 MG/ML IJ SOLN
INTRAMUSCULAR | Status: AC
  Filled 2019-04-29: qty 1

## 2019-04-29 MED ORDER — CEFAZOLIN SODIUM-DEXTROSE 2-4 GM/100ML-% IV SOLN
INTRAVENOUS | Status: AC
  Filled 2019-04-29: qty 100

## 2019-04-29 MED ORDER — URIBEL 118 MG PO CAPS: 1.0000 | ORAL_CAPSULE | Freq: Three times a day (TID) | ORAL | 0 refills | Status: DC | PRN

## 2019-04-29 MED ORDER — LIDOCAINE HCL (CARDIAC) PF 100 MG/5ML IV SOSY
PREFILLED_SYRINGE | INTRAVENOUS | Status: DC | PRN
  Administered 2019-04-29: 50 mg via INTRAVENOUS

## 2019-04-29 MED ORDER — ONDANSETRON HCL 4 MG/2ML IJ SOLN
INTRAMUSCULAR | Status: AC
  Filled 2019-04-29: qty 2

## 2019-04-29 MED ORDER — ONDANSETRON HCL 4 MG/2ML IJ SOLN: 4.0000 mg | Freq: Once | INTRAMUSCULAR | Status: DC | PRN

## 2019-04-29 MED ORDER — LIDOCAINE HCL (PF) 2 % IJ SOLN
INTRAMUSCULAR | Status: AC
  Filled 2019-04-29: qty 10

## 2019-04-29 SURGICAL SUPPLY — 15 items
BAG DRAIN CYSTO-URO LG1000N (MISCELLANEOUS) ×3 IMPLANT
BRUSH SCRUB EZ  4% CHG (MISCELLANEOUS) ×2
BRUSH SCRUB EZ 4% CHG (MISCELLANEOUS) ×1 IMPLANT
GLOVE BIO SURGEON STRL SZ8 (GLOVE) ×6 IMPLANT
GOWN STRL REUS W/ TWL LRG LVL3 (GOWN DISPOSABLE) ×1 IMPLANT
GOWN STRL REUS W/ TWL XL LVL3 (GOWN DISPOSABLE) ×1 IMPLANT
GOWN STRL REUS W/TWL LRG LVL3 (GOWN DISPOSABLE) ×2
GOWN STRL REUS W/TWL XL LVL3 (GOWN DISPOSABLE) ×2
KIT TURNOVER CYSTO (KITS) ×3 IMPLANT
PACK CYSTO AR (MISCELLANEOUS) ×3 IMPLANT
SET CYSTO W/LG BORE CLAMP LF (SET/KITS/TRAYS/PACK) ×3 IMPLANT
SURGILUBE 2OZ TUBE FLIPTOP (MISCELLANEOUS) ×3 IMPLANT
SYSTEM UROLIFT (Male Continence) ×18 IMPLANT
WATER STERILE IRR 1000ML POUR (IV SOLUTION) ×3 IMPLANT
WATER STERILE IRR 3000ML UROMA (IV SOLUTION) ×3 IMPLANT

## 2019-04-29 NOTE — Anesthesia Postprocedure Evaluation (Signed)
Anesthesia Post Note  Patient: Paul Bass  Procedure(s) Performed: CYSTOSCOPY WITH INSERTION OF UROLIFT (N/A )  Patient location during evaluation: PACU Anesthesia Type: General Level of consciousness: awake and alert and oriented Pain management: pain level controlled Vital Signs Assessment: post-procedure vital signs reviewed and stable Respiratory status: spontaneous breathing, nonlabored ventilation and respiratory function stable Cardiovascular status: blood pressure returned to baseline and stable Postop Assessment: no signs of nausea or vomiting Anesthetic complications: no     Last Vitals:  Vitals:   04/29/19 1328 04/29/19 1403  BP: (!) 138/57 (!) 150/64  Pulse: 65 (!) 56  Resp: 18 16  Temp: 36.6 C   SpO2: 97% 98%    Last Pain:  Vitals:   04/29/19 1403  TempSrc:   PainSc: 0-No pain                 Jhada Risk

## 2019-04-29 NOTE — Interval H&P Note (Signed)
History and Physical Interval Note: Lungs-clear CV-RRR  04/29/2019 12:02 PM  Paul Bass  has presented today for surgery, with the diagnosis of BPH WITH LUTS, BLADDER CALCULUS.  The various methods of treatment have been discussed with the patient and family. After consideration of risks, benefits and other options for treatment, the patient has consented to  Procedure(s): CYSTOSCOPY WITH INSERTION OF UROLIFT (N/A) as a surgical intervention.  The patient's history has been reviewed, patient examined, no change in status, stable for surgery.  I have reviewed the patient's chart and labs.  Questions were answered to the patient's satisfaction.     [place]

## 2019-04-29 NOTE — Anesthesia Post-op Follow-up Note (Signed)
Anesthesia QCDR form completed.        

## 2019-04-29 NOTE — Discharge Instructions (Addendum)
Urolift Post-Operative Instructions    Patient Expectations     1. Mild hematuria for about 1 week.  2. Dysuria, frequency, and urgency for 10 days.  3. Mild pelvic pain 1-2 weeks.      Return to Activity     1. Drink water post procedure.  2. Take meds as needed.  3. No lifting or straining 48hrs.  4. Other activity when they feel up to it.     Medications     1. NSAIDS as needed.  2. Pyridium / Uribel for dysuria x10 days as needed  3. Continue BPH Medications ( tamsulosin ) for 10-14 days.     Follow-Up     1. Visit at 4wks, 71mo and annually with bladder scan.  AMBULATORY SURGERY  DISCHARGE INSTRUCTIONS   1) The drugs that you were given will stay in your system until tomorrow so for the next 24 hours you should not:  A) Drive an automobile B) Make any legal decisions C) Drink any alcoholic beverage   2) You may resume regular meals tomorrow.  Today it is better to start with liquids and gradually work up to solid foods.  You may eat anything you prefer, but it is better to start with liquids, then soup and crackers, and gradually work up to solid foods.   3) Please notify your doctor immediately if you have any unusual bleeding, trouble breathing, redness and pain at the surgery site, drainage, fever, or pain not relieved by medication.    4) Additional Instructions:        Please contact your physician with any problems or Same Day Surgery at [phone], Monday through Friday 6 am to 4 pm, or [place] at [facility] number at [phone].

## 2019-04-29 NOTE — Transfer of Care (Signed)
Immediate Anesthesia Transfer of Care Note  Patient: Paul Bass  Procedure(s) Performed: CYSTOSCOPY WITH INSERTION OF UROLIFT (N/A )  Patient Location: PACU  Anesthesia Type:General  Level of Consciousness: drowsy  Airway & Oxygen Therapy: Patient Spontanous Breathing and Patient connected to face mask oxygen  Post-op Assessment: Report given to RN and Post -op Vital signs reviewed and stable  Post vital signs: Reviewed and stable  Last Vitals:  Vitals Value Taken Time  BP 132/74 04/29/19 1254  Temp    Pulse 76 04/29/19 1255  Resp 0 04/29/19 1255  SpO2 97 % 04/29/19 1255  Vitals shown include unvalidated device data.  Last Pain:  Vitals:   04/29/19 1254  TempSrc:   PainSc: (P) Asleep         Complications: No apparent anesthesia complications

## 2019-04-29 NOTE — ED Triage Notes (Signed)
Patient to ER for c/o urinary retention. Patient states he had procedure done this am (urolift), has not been able to urinate since.

## 2019-04-29 NOTE — Anesthesia Procedure Notes (Signed)
Procedure Name: LMA Insertion Performed by: Eben Burow, CRNA Pre-anesthesia Checklist: Patient identified, Suction available, Emergency Drugs available, Patient being monitored and Timeout performed Patient Re-evaluated:Patient Re-evaluated prior to induction Oxygen Delivery Method: Circle system utilized Preoxygenation: Pre-oxygenation with 100% oxygen Induction Type: IV induction LMA: LMA inserted LMA Size: 4.5 Number of attempts: 1

## 2019-04-29 NOTE — Telephone Encounter (Signed)
-----   Message from Abbie Sons, MD sent at 04/29/2019  1:17 PM EDT ----- Regarding: Postop follow-up Postop follow-up UroLift-1 month.  Needs PVR, IPSS

## 2019-04-29 NOTE — ED Notes (Signed)
Pt states he had a prostate procedure this morning around 12pm, states he has not been able to void since 9am this morning, states when he does try to void it is drops of blood.  Pt in obvious discomfort

## 2019-04-29 NOTE — Op Note (Signed)
Preoperative diagnosis: BPH with LUTS  Postoperative diagnosis: BPH with LUTS  Procedure:  1. Urolift (placement of 6 implants) 2. Cystoscopy with bladder calculus removal  Surgeon: Bernardo Heater  Anesthesia: General/LMA  Complications: None  Drains: None  Estimated blood loss: < 5 mL  Indications: 73 year-old male with obstructive symptomatology secondary to BPH.  PVR has been 150-200 mL.  He has a <1 cm bladder calculus.  IPSS was 21/35.  Cystoscopy showed lateral lobe enlargement without obstructing median lobe.  Prostate volume was 42 cc.  After discussing management options he has chosen to have a Urolift procedure.  He has been instructed to the procedure as well as risks and complications which include but are not limited to infection, bleeding, and inadequate treatment with the Urolift procedure alone, anesthetic complications, among others.  He understands these and desires to proceed.  Findings: Using the 17 French cystoscope, urethra and bladder were inspected.  There were no urethral lesions.  Prostatic urethra was obstructed secondary to bilobar hypertrophy.  The bladder was inspected circumferentially.  This revealed normal findings.  Description of procedure: The patient was properly identified in the holding area.  He received preoperative IV antibiotics.  He was taken to the operating room where general anesthetic was administered with the LMA.  He is placed in the dorsolithotomy position.  Genitalia and perineum were prepped and draped.  Proper timeout was performed.  A 45F cystoscope was inserted into the bladder. The cystoscopy bridge was replaced with a UroLift delivery device.  The 1st pair of implants were placed 1.5-2 cm from the bladder neck.  The 2nd pair of implants were placed just proximal to the verumontanum. The The delivery device was then replaced with cystoscope and bridge and the implant location and opening effect was confirmed cystoscopically.  An additional  pair of implants was placed anteriorly stacked above the proximal pair of implants.  A final cystoscopy was conducted first to inspect the location and state of each implant and second, to confirm the presence of a continuous anterior channel was present through the prostatic urethra with irrigation flow turned off.  No injury to the bladder or urethra was detected. 6 Implants were delivered in total.   He tolerated procedure well and after anesthetic reversal was transported to the PACU in stable condition.    [employer]. MD

## 2019-04-29 NOTE — Telephone Encounter (Signed)
APP MADE ° °

## 2019-04-29 NOTE — Anesthesia Preprocedure Evaluation (Signed)
Anesthesia Evaluation  Patient identified by MRN, date of birth, ID band Patient awake    Reviewed: Allergy & Precautions, NPO status , Patient's Chart, lab work & pertinent test results  History of Anesthesia Complications Negative for: history of anesthetic complications  Airway Mallampati: III       Dental  (+) Upper Dentures, Dental Advidsory Given   Pulmonary neg shortness of breath, neg sleep apnea, COPD,  COPD inhaler, neg recent URI, former smoker,           Cardiovascular hypertension, Pt. on medications (-) angina+ CAD, + CABG and + Peripheral Vascular Disease  (-) CHF (-) dysrhythmias (-) Valvular Problems/Murmurs     Neuro/Psych neg Seizures Anxiety Depression CVA (R side weakness, speech difficulties), Residual Symptoms    GI/Hepatic Neg liver ROS, GERD  ,  Endo/Other  diabetes, Type 2, Oral Hypoglycemic AgentsHypothyroidism   Renal/GU negative Renal ROS     Musculoskeletal   Abdominal   Peds  Hematology   Anesthesia Other Findings Past Medical History: No date: Anxiety No date: BPH (benign prostatic hypertrophy) No date: CAD S/P percutaneous coronary angioplasty No date: COPD (chronic obstructive pulmonary disease) ([name]) No date: Depression No date: Diabetes mellitus without complication ([name]) No date: Dyspnea No date: GERD (gastroesophageal reflux disease) No date: HTN (hypertension) No date: Hyperlipidemia No date: Hypothyroidism No date: Stroke ([place]) No date: Tobacco abuse   Reproductive/Obstetrics                             Anesthesia Physical  Anesthesia Plan  ASA: III  Anesthesia Plan: General   Post-op Pain Management:    Induction: Intravenous  PONV Risk Score and Plan: 2 and Ondansetron, Dexamethasone and Treatment may vary due to age or medical condition  Airway Management Planned: LMA  Additional Equipment:   Intra-op Plan:   Post-operative  Plan: Extubation in OR  Informed Consent: I have reviewed the patients History and Physical, chart, labs and discussed the procedure including the risks, benefits and alternatives for the proposed anesthesia with the patient or authorized representative who has indicated his/her understanding and acceptance.       Plan Discussed with:   Anesthesia Plan Comments:         Anesthesia Quick Evaluation

## 2019-04-30 ENCOUNTER — Telehealth: Payer: Self-pay | Admitting: Urology

## 2019-04-30 LAB — URINALYSIS, COMPLETE (UACMP) WITH MICROSCOPIC
Bacteria, UA: NONE SEEN
Bilirubin Urine: NEGATIVE
Glucose, UA: >=500 mg/dL — AB
Ketones, ur: NEGATIVE mg/dL
Leukocytes,Ua: NEGATIVE
Nitrite: NEGATIVE
Protein, ur: 100 mg/dL — AB
RBC / HPF: >50 RBC/hpf — ABNORMAL HIGH (ref 0–5)
Specific Gravity, Urine: 1.013 (ref 1.005–1.030)
Squamous Epithelial / HPF: NONE SEEN (ref 0–5)
WBC, UA: NONE SEEN WBC/hpf (ref 0–5)
pH: 6 (ref 5.0–8.0)

## 2019-04-30 NOTE — Telephone Encounter (Signed)
Called pt's wife informed her that pt needs to be seen in 24-48hrs for voiding trial. Pt scheduled for 05/02/2019 at 8am. Wife gave verbal understanding. Advised wife that she would not be able to come back with patient however she is more than welcomed to call pt and we can place her on speaker phone to make sure she's informed.

## 2019-04-30 NOTE — Telephone Encounter (Signed)
Pt's wife LMOM and states that the pt had surgery yesterday with Dr Bernardo Heater and had to be taken to ER last night and a foley cath was placed. She would like a call back to discuss further treatment.

## 2019-04-30 NOTE — ED Notes (Signed)
Pt reports relief after foley catheter insertion, family and patient educated on use of foley, emptying leg bag and when to return to ED.  Pt and family verbalized understanding. Wife demonstrated how to empty leg bag.  Wife concerned about color of urine and was reassured by Dr. Alfred Levins that it was likely related to procedure done today.  Pt and wife advised to call Dr. Dene Gentry office in the morning.

## 2019-04-30 NOTE — ED Provider Notes (Signed)
[facility] Emergency Department Provider Note  ____________________________________________  Time seen: Approximately 12:27 AM  I have reviewed the triage vital signs and the nursing notes.   HISTORY  Chief Complaint Urinary Retention   HPI Paul Bass is a 73 y.o. male who presents for urinary retention.  Patient underwent a cystoscopy with UroLift by Dr. Bernardo Heater earlier today.  Patient has been unable to urinate since going home.  Is complaining of severe lower abdominal pain and pressure which has been progressively worse over the last few hours.  No fever or chills.  No nausea or vomiting.   Past Medical History:  Diagnosis Date   Anxiety    BPH (benign prostatic hypertrophy)    CAD S/P percutaneous coronary angioplasty    COPD (chronic obstructive pulmonary disease) ([name])    Depression    Diabetes mellitus without complication ([name])    Dyspnea    GERD (gastroesophageal reflux disease)    HTN (hypertension)    Hyperlipidemia    Hypothyroidism    Stroke [facility])    Tobacco abuse     Patient Active Problem List   Diagnosis Date Noted   Encounter for screening colonoscopy    Polyp of colon    Atherosclerotic heart disease of native coronary artery without angina pectoris 01/14/2019   Depression 01/14/2019   GERD (gastroesophageal reflux disease) 01/14/2019   Periodic limb movement disorder (PLMD) 01/14/2019   Sleep apnea 01/14/2019   Type 2 diabetes mellitus without complications ([place]) 99991111   Stricture and stenosis of esophagus    Problems with swallowing and mastication    Food impaction of esophagus    H/O coronary artery bypass surgery 01/17/2016   Cerebrovascular accident (CVA) due to embolism ([place]) 01/03/2016   Hypothyroidism, unspecified 01/03/2016   HLD (hyperlipidemia) 01/03/2016   Stable angina ([place]) 11/29/2015   Benign essential hypertension 05/27/2015   Bilateral carotid artery stenosis  02/24/2015   Atherosclerotic peripheral vascular disease ([place]) 09/07/2014   Bladder calculus 08/12/2014   Primary osteoarthritis of left knee 03/10/2014   Snoring 01/22/2013   Sleepiness 01/22/2013   Dysphagia, unspecified(787.20) 01/22/2013   Occlusion and stenosis of carotid artery without mention of cerebral infarction 01/22/2013   Cerebral thrombosis with cerebral infarction ([place]) 01/22/2013   Benign prostatic hyperplasia with incomplete bladder emptying 08/26/2012   Chronic prostatitis 08/26/2012   Family history of malignant neoplasm of prostate 08/26/2012   Incomplete emptying of bladder 08/26/2012   Dysphagia 05/14/2012   Global aphasia 05/14/2012   Stroke, acute, thrombotic ([place]) 05/12/2012   Symptomatic carotid artery stenosis 05/12/2012   HTN (hypertension) 05/12/2012   COPD (chronic obstructive pulmonary disease) ([place]) 05/12/2012   Acute respiratory failure (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   CORONARY ARTERY BYPASS GRAFT     CYSTOSCOPY WITH INSERTION OF UROLIFT N/A 04/29/2019   Procedure: CYSTOSCOPY WITH INSERTION OF UROLIFT;  Surgeon: Abbie Sons, MD;  Location: [facility] ORS;  Service: Urology;  Laterality: N/A;   ESOPHAGOGASTRODUODENOSCOPY (EGD) WITH PROPOFOL N/A 09/11/2016  Procedure: ESOPHAGOGASTRODUODENOSCOPY (EGD) WITH PROPOFOL;  Surgeon: Lucilla Lame, MD;  Location: [facility] ENDOSCOPY;  Service: Endoscopy;  Laterality: N/A;   ESOPHAGOGASTRODUODENOSCOPY (EGD)  WITH PROPOFOL N/A 10/17/2016   Procedure: ESOPHAGOGASTRODUODENOSCOPY (EGD) WITH PROPOFOL;  Surgeon: Lucilla Lame, MD;  Location: [facility] ENDOSCOPY;  Service: Endoscopy;  Laterality: N/A;   ESOPHAGOGASTRODUODENOSCOPY (EGD) WITH PROPOFOL N/A 11/13/2017   Procedure: ESOPHAGOGASTRODUODENOSCOPY (EGD) WITH PROPOFOL;  Surgeon: Lucilla Lame, MD;  Location: [facility] ENDOSCOPY;  Service: Endoscopy;  Laterality: N/A;   pins R lower leg     rotator cuff replaced     right   SAVORY DILATION N/A 10/17/2016   Procedure: SAVORY DILATION;  Surgeon: Lucilla Lame, MD;  Location: [facility] ENDOSCOPY;  Service: Endoscopy;  Laterality: N/A;    Prior to Admission medications   Medication Sig Start Date End Date Taking? Authorizing Provider  albuterol (PROVENTIL HFA;VENTOLIN HFA) 108 (90 BASE) MCG/ACT inhaler Inhale 2 puffs into the lungs every 6 (six) hours as needed for wheezing or shortness of breath. For shortness of breath    [provider]  atorvastatin (LIPITOR) 80 MG tablet Take 80 mg by mouth daily.  01/09/16   [provider]  budesonide-formoterol (SYMBICORT) 160-4.5 MCG/ACT inhaler Inhale 2 puffs into the lungs daily.     [provider]  buPROPion (WELLBUTRIN XL) 300 MG 24 hr tablet Take 300 mg by mouth at bedtime.  06/22/16   [provider]  cholecalciferol (VITAMIN D3) 25 MCG (1000 UT) tablet Take 1,000 Units by mouth every evening.    [provider]  Cinnamon Bark POWD Take 1,000 mg by mouth daily.     [provider]  citalopram (CELEXA) 20 MG tablet Take 20 mg by mouth daily.    [provider]  clonazePAM (KLONOPIN) 0.5 MG tablet Take 0.5 mg by mouth at bedtime as needed (restless leg syndrome).     [provider]  clopidogrel (PLAVIX) 75 MG tablet Take 75 mg by mouth daily.  12/21/15   [provider]  Coenzyme Q10 (COQ10) 100 MG CAPS Take 100 mg by mouth at bedtime.    [provider]  glucose blood test strip      [provider]  levothyroxine (SYNTHROID, LEVOTHROID) 100 MCG tablet Take 100 mcg by mouth daily before breakfast.    [provider]  losartan (COZAAR) 100 MG tablet Take 50 mg by mouth every evening.  01/02/19   [provider]  metFORMIN (GLUCOPHAGE) 500 MG tablet Take 500 mg by mouth 2 (two) times daily.  09/07/14   [provider]  Meth-Hyo-M Barnett Hatter Phos-Ph Sal (URIBEL) 118 MG CAPS Take 1 capsule (118 mg total) by mouth 3 (three) times daily as needed (Urinary frequency, urgency, burning). 04/29/19   Stoioff, Ronda Fairly, MD  Multiple Vitamin (MULTIVITAMIN WITH MINERALS) TABS Take 0.5 tablets by mouth 2 (two) times daily.    [provider]  naproxen sodium (ALEVE) 220 MG tablet Take 220-440 mg by mouth 2 (two) times daily as needed (pain.).    [provider]  Omega-3 Fatty Acids (FISH OIL) 1200 MG CAPS Take 1,000 mg by mouth 2 (two) times daily.    [provider]  pantoprazole (PROTONIX) 40 MG tablet TAKE 1 TABLET BY MOUTH EVERY DAY Patient taking differently: Take 40 mg by mouth at bedtime.  06/07/18   Lucilla Lame, MD  psyllium (METAMUCIL SMOOTH TEXTURE) 28 % packet Take 1 packet by mouth daily.    [provider]  RA KRILL OIL 500  MG CAPS Take 500 mg by mouth at bedtime.     [provider]  sulfamethoxazole-trimethoprim (BACTRIM DS) 800-160 MG tablet Take 1 tablet by mouth 2 (two) times daily for 5 days. 04/27/19 05/02/19  Stoioff, Ronda Fairly, MD  tamsulosin (FLOMAX) 0.4 MG CAPS capsule Take 0.4 mg by mouth 2 (two) times daily.  06/16/14   [provider]    Allergies Patient has no known allergies.  Family History  Problem Relation Age of Onset   Heart failure Father    Throat cancer Mother    Diabetes Brother     Social History Social History   Tobacco Use   Smoking status: Former Smoker    Packs/day: 2.00    Types: Cigarettes    Quit date: 05/20/2012    Years since quitting: 6.9    Smokeless tobacco: Never Used  Substance Use Topics   Alcohol use: Not Currently    Comment: weekend drinker, had 5 beers on 05/11/12, prior to stroke, No alcohol since stroke   Drug use: Never    Review of Systems  Constitutional: Negative for fever. Eyes: Negative for visual changes. ENT: Negative for sore throat. Neck: No neck pain  Cardiovascular: Negative for chest pain. Respiratory: Negative for shortness of breath. Gastrointestinal: Negative for abdominal pain, vomiting or diarrhea. Genitourinary: Negative for dysuria. + Urinary retention Musculoskeletal: Negative for back pain. Skin: Negative for rash. Neurological: Negative for headaches, weakness or numbness. Psych: No SI or HI  ____________________________________________   PHYSICAL EXAM:  VITAL SIGNS: ED Triage Vitals  Enc Vitals Group     BP 04/29/19 2309 (!) 118/97     Pulse Rate 04/29/19 2309 (!) 129     Resp 04/29/19 2309 20     Temp 04/29/19 2309 98.5 F (36.9 C)     Temp Source 04/29/19 2309 Oral     SpO2 04/29/19 2309 95 %     Weight 04/29/19 2310 229 lb 15 oz (104.3 kg)     Height 04/29/19 2310 6' (1.829 m)     Head Circumference --      Peak Flow --      Pain Score 04/29/19 2310 10     Pain Loc --      Pain Edu? --      Excl. in [place]? --     Constitutional: Alert and oriented, patient looks very uncomfortable.  HEENT:      Head: Normocephalic and atraumatic.         Eyes: Conjunctivae are normal. Sclera is non-icteric.       Mouth/Throat: Mucous membranes are moist.       Neck: Supple with no signs of meningismus. Cardiovascular: Regular rate and rhythm.  Respiratory: Normal respiratory effort.  Gastrointestinal: Distended bladder Musculoskeletal:  No edema, cyanosis, or erythema of extremities. Neurologic: Normal speech and language. Face is symmetric. Moving all extremities. No gross focal neurologic deficits are appreciated. Skin: Skin is warm, dry and intact. No rash  noted. Psychiatric: Mood and affect are normal. Speech and behavior are normal.  ____________________________________________   LABS (all labs ordered are listed, but only abnormal results are displayed)  Labs Reviewed  URINALYSIS, COMPLETE (UACMP) WITH MICROSCOPIC - Abnormal; Notable for the following components:      Result Value   Color, Urine AMBER (*)    APPearance CLOUDY (*)    Glucose, UA >=500 (*)    Hgb urine dipstick LARGE (*)    Protein, ur 100 (*)    RBC /  HPF >50 (*)    All other components within normal limits  URINE CULTURE   ____________________________________________  EKG  none  ____________________________________________  RADIOLOGY  none  ____________________________________________   PROCEDURES  Procedure(s) performed: None Procedures Critical Care performed:  None ____________________________________________   INITIAL IMPRESSION / ASSESSMENT AND PLAN / ED COURSE   73 y.o. male who presents for urinary retention after undergoing cystoscopy with UroLift this morning by Dr. Bernardo Heater.  Patient arrives looking extremely uncomfortable due to urinary retention, distended bladder on exam, bladder scan is 750 cc.  Dr. Bernardo Heater was consulted prior to Foley placement to ensure safety of procedure considering patient is UroLift this morning.  Dr. Bernardo Heater recommended putting a coud catheter.  That was done safely with immediate urine return.  Patient drain 800 cc of urine.  Patient reports full resolution of his symptoms.  Patient is on Flomax and Bactrim.  Recommended continuing these medications and following up with Dr. Bernardo Heater in 24 to 48 hours for trial of void.  Discussed return precautions for any signs of infection or further episodes of retention.  Discussed Foley care with patient.       As part of my medical decision making, I reviewed the following data within the [place] History obtained from family, Nursing notes reviewed and  incorporated, Labs reviewed , A consult was requested and obtained from this/these consultant(s) Urology, Notes from prior ED visits and [place] Controlled Substance Database   Patient was evaluated in Emergency Department today for the symptoms described in the history of present illness. Patient was evaluated in the context of the global COVID-19 pandemic, which necessitated consideration that the patient might be at risk for infection with the SARS-CoV-2 virus that causes COVID-19. Institutional protocols and algorithms that pertain to the evaluation of patients at risk for COVID-19 are in a state of rapid change based on information released by regulatory bodies including the CDC and federal and state organizations. These policies and algorithms were followed during the patient's care in the ED.   ____________________________________________   FINAL CLINICAL IMPRESSION(S) / ED DIAGNOSES   Final diagnoses:  Urinary retention      NEW MEDICATIONS STARTED DURING THIS VISIT:  ED Discharge Orders    None       Note:  This document was prepared using Dragon voice recognition software and may include unintentional dictation errors.    Rudene Re, MD 04/30/19 [phone]

## 2019-04-30 NOTE — Discharge Instructions (Addendum)
Take the antibiotics and Flomax given to you by Dr. Bess Kinds as prescribed.  Call his office in the morning.  Return to the emergency room for abdominal pain, fever, chills, nausea or vomiting.  Catheter care  Always wash your hands before and after touching your catheter.  Check the area around the urethra for inflammation or signs of infection. Signs of infection include irritated, swollen, red, or tender skin, or pus around the catheter.  Clean the area around the catheter with soap and water two times a day. Dry with a clean towel afterward.  Do not apply powder or lotion to the skin around the catheter.  To empty the urine collection bag  Wash your hands with soap and water.  Without touching the drain spout, remove the spout from its sleeve at the bottom of the collection bag. Open the valve on the spout.  Let the urine flow out of the bag and into the toilet or a container. Do not let the tubing or drain spout touch anything.  After you empty the bag, clean the end of the drain spout with tissue and water. Close the valve and put the drain spout back into its sleeve at the bottom of the collection bag.  Wash your hands with soap and water.

## 2019-05-01 LAB — URINE CULTURE: Culture: NO GROWTH

## 2019-05-02 ENCOUNTER — Telehealth: Payer: Self-pay

## 2019-05-02 ENCOUNTER — Ambulatory Visit (INDEPENDENT_AMBULATORY_CARE_PROVIDER_SITE_OTHER): Payer: Medicare HMO | Admitting: Urology

## 2019-05-02 ENCOUNTER — Other Ambulatory Visit: Payer: Self-pay

## 2019-05-02 ENCOUNTER — Encounter: Payer: Self-pay | Admitting: Urology

## 2019-05-02 ENCOUNTER — Ambulatory Visit: Payer: Medicare HMO

## 2019-05-02 VITALS — BP 118/71 | HR 81 | Ht 71.0 in | Wt 231.0 lb

## 2019-05-02 DIAGNOSIS — N401 Enlarged prostate with lower urinary tract symptoms: Secondary | ICD-10-CM | POA: Diagnosis not present

## 2019-05-02 DIAGNOSIS — R338 Other retention of urine: Secondary | ICD-10-CM

## 2019-05-02 NOTE — Progress Notes (Signed)
Catheter Removal  Patient is present today for a catheter removal.  85ml of water was drained from the balloon. A 16FR foley cath was removed from the bladder no complications were noted. Patient tolerated well. Patient to RTC this afternoon by 2:00pm if unable to void.   Performed by: Gordy Clement, CMA ([facility]AMA)  Follow up/ Additional notes: RTC if unable to void, follow up as scheduled for post op

## 2019-05-02 NOTE — Telephone Encounter (Signed)
Called pt's wife informed her of the information provided to patient during his appointment. See encounter notes from 05/02/2019. Wife gave verbal understanding.

## 2019-05-02 NOTE — Progress Notes (Signed)
05/02/2019 8:29 AM   [name] 1946/07/10 [id number]  Referring provider: [name], NP [address] [place],  [place] [address]  Chief Complaint  Patient presents with  . Hospitalization Follow-up    HPI: Mr. Can underwent cystoscopic removal of a small bladder calculus and UroLift on 9/29.  He was unable to void postop and was seen in the ED that evening where a Foley catheter was placed with return of 800 mL of urine.  Intraoperative findings remarkable for lateral lobe enlargement and an elevated bladder neck however the channel was open after implant placement.   PMH: Past Medical History:  Diagnosis Date  . Anxiety   . BPH (benign prostatic hypertrophy)   . CAD S/P percutaneous coronary angioplasty   . COPD (chronic obstructive pulmonary disease) ([place])   . Depression   . Diabetes mellitus without complication ([place])   . Dyspnea   . GERD (gastroesophageal reflux disease)   . HTN (hypertension)   . Hyperlipidemia   . Hypothyroidism   . Stroke ([place])   . Tobacco abuse     Surgical History: Past Surgical History:  Procedure Laterality Date  . CARDIAC CATHETERIZATION N/A 12/07/2015   Procedure: Left Heart Cath and Coronary Angiography;  Surgeon: Corey Skains, MD;  Location: [facility];  Service: Cardiovascular;  Laterality: N/A;  . COLONOSCOPY WITH PROPOFOL N/A 02/18/2019   Procedure: COLONOSCOPY WITH PROPOFOL;  Surgeon: Lucilla Lame, MD;  Location: [facility]OSCOPY;  Service: Endoscopy;  Laterality: N/A;  . 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 CORONARY ARTERY BYPASS GRAFT    . CYSTOSCOPY WITH INSERTION OF UROLIFT N/A 04/29/2019   Procedure: CYSTOSCOPY WITH INSERTION OF UROLIFT;  Surgeon: Abbie Sons, MD;  Location: [facility];  Service: Urology;  Laterality: N/A;  . ESOPHAGOGASTRODUODENOSCOPY (EGD) WITH PROPOFOL N/A 09/11/2016   Procedure: ESOPHAGOGASTRODUODENOSCOPY (EGD) WITH PROPOFOL;  Surgeon:  Lucilla Lame, MD;  Location: [facility]OSCOPY;  Service: Endoscopy;  Laterality: N/A;  . ESOPHAGOGASTRODUODENOSCOPY (EGD) WITH PROPOFOL N/A 10/17/2016   Procedure: ESOPHAGOGASTRODUODENOSCOPY (EGD) WITH PROPOFOL;  Surgeon: Lucilla Lame, MD;  Location: [facility]OSCOPY;  Service: Endoscopy;  Laterality: N/A;  . ESOPHAGOGASTRODUODENOSCOPY (EGD) WITH PROPOFOL N/A 11/13/2017   Procedure: ESOPHAGOGASTRODUODENOSCOPY (EGD) WITH PROPOFOL;  Surgeon: Lucilla Lame, MD;  Location: [facility] ENDOSCOPY;  Service: Endoscopy;  Laterality: N/A;  . pins R lower leg    . rotator cuff replaced     right  . SAVORY DILATION N/A 10/17/2016   Procedure: SAVORY DILATION;  Surgeon: Lucilla Lame, MD;  Location: [facility] ENDOSCOPY;  Service: Endoscopy;  Laterality: N/A;    Home Medications:  Allergies as of 05/02/2019   No Known Allergies     Medication List       Accurate as of May 02, 2019  8:29 AM. If you have any questions, ask your nurse or doctor.        albuterol 108 (90 Base) MCG/ACT inhaler Commonly known as: VENTOLIN HFA Inhale 2 puffs into the lungs every 6 (six) hours as needed for wheezing or shortness of breath. For shortness of breath   atorvastatin 80 MG tablet Commonly known as: LIPITOR Take 80 mg by mouth daily.   budesonide-formoterol 160-4.5 MCG/ACT inhaler Commonly known as: SYMBICORT Inhale 2 puffs into the lungs daily.   buPROPion 300 MG 24 hr tablet Commonly known as: WELLBUTRIN XL Take 300 mg by mouth at bedtime.   cholecalciferol 25 MCG (1000 UT) tablet  Commonly known as: VITAMIN D3 Take 1,000 Units by mouth every evening.   Cinnamon Bark Powd Take 1,000 mg by mouth daily.   citalopram 20 MG tablet Commonly known as: CELEXA Take 20 mg by mouth daily.   clonazePAM 0.5 MG tablet Commonly known as: KLONOPIN Take 0.5 mg by mouth at bedtime as needed (restless leg syndrome).   clopidogrel 75 MG tablet Commonly known as: PLAVIX Take 75 mg by mouth daily.   CoQ10 100 MG Caps Take 100  mg by mouth at bedtime.   Fish Oil 1200 MG Caps Take 1,000 mg by mouth 2 (two) times daily.   glucose blood test strip   levothyroxine 100 MCG tablet Commonly known as: SYNTHROID Take 100 mcg by mouth daily before breakfast.   losartan 100 MG tablet Commonly known as: COZAAR Take 50 mg by mouth every evening.   metFORMIN 500 MG tablet Commonly known as: GLUCOPHAGE Take 500 mg by mouth 2 (two) times daily.   multivitamin with minerals Tabs tablet Take 0.5 tablets by mouth 2 (two) times daily.   naproxen sodium 220 MG tablet Commonly known as: ALEVE Take 220-440 mg by mouth 2 (two) times daily as needed (pain.).   pantoprazole 40 MG tablet Commonly known as: PROTONIX TAKE 1 TABLET BY MOUTH EVERY DAY What changed: when to take this   psyllium 28 % packet Commonly known as: METAMUCIL SMOOTH TEXTURE Take 1 packet by mouth daily.   RA Krill Oil 500 MG Caps Take 500 mg by mouth at bedtime.   sulfamethoxazole-trimethoprim 800-160 MG tablet Commonly known as: BACTRIM DS Take 1 tablet by mouth 2 (two) times daily for 5 days.   tamsulosin 0.4 MG Caps capsule Commonly known as: FLOMAX Take 0.4 mg by mouth 2 (two) times daily.   Uribel 118 MG Caps Take 1 capsule (118 mg total) by mouth 3 (three) times daily as needed (Urinary frequency, urgency, burning).       Allergies: No Known Allergies  Family History: Family History  Problem Relation Age of Onset  . Heart failure Father   . Throat cancer Mother   . Diabetes Brother     Social History:  reports that he quit smoking about 6 years ago. His smoking use included cigarettes. He smoked 2.00 packs per day. He has never used smokeless tobacco. He reports previous alcohol use. He reports that he does not use drugs.  ROS: UROLOGY Frequent Urination?: No Hard to postpone urination?: No Burning/pain with urination?: No Get up at night to urinate?: No Leakage of urine?: No Urine stream starts and stops?: No Trouble  starting stream?: No Do you have to strain to urinate?: No Blood in urine?: No Urinary tract infection?: No Sexually transmitted disease?: No Injury to kidneys or bladder?: No Painful intercourse?: No Weak stream?: No Erection problems?: No Penile pain?: No  Gastrointestinal Nausea?: No Vomiting?: No Indigestion/heartburn?: No Diarrhea?: No Constipation?: No  Constitutional Fever: No Night sweats?: No Weight loss?: No Fatigue?: No  Skin Skin rash/lesions?: No Itching?: No  Eyes Blurred vision?: No Double vision?: No  Ears/Nose/Throat Sore throat?: No Sinus problems?: No  Hematologic/Lymphatic Swollen glands?: No Easy bruising?: No  Cardiovascular Leg swelling?: No Chest pain?: No  Respiratory Cough?: No Shortness of breath?: No  Endocrine Excessive thirst?: No  Musculoskeletal Back pain?: No Joint pain?: No  Neurological Headaches?: No Dizziness?: No  Psychologic Depression?: No Anxiety?: No  Physical Exam: BP 118/71 (BP Location: Left Arm, Patient Position: Sitting, Cuff Size: Normal)  Pulse 81   Ht 5\' 11"  (1.803 m)   Wt 231 lb (104.8 kg)   BMI 32.22 kg/m   Constitutional:  Alert and oriented, No acute distress. HEENT: [place] AT, moist mucus membranes.  Trachea midline, no masses. Cardiovascular: No clubbing, cyanosis, or edema. Respiratory: Normal respiratory effort, no increased work of breathing.   Assessment & Plan:   Urinary retention status post UroLift placement.  I suspect the etiology was postoperative edema.  Catheter was removed today.  He was directed to call for voiding difficulties and will otherwise keep his postop follow-up appointment.  Abbie Sons, [place] [address] [place], [place] [address] [phone]

## 2019-05-03 ENCOUNTER — Emergency Department: Payer: Medicare HMO

## 2019-05-03 ENCOUNTER — Other Ambulatory Visit: Payer: Self-pay

## 2019-05-03 ENCOUNTER — Emergency Department
Admission: EM | Admit: 2019-05-03 | Discharge: 2019-05-03 | Disposition: A | Discharge status: Home / Self Care | Payer: Medicare HMO | Attending: Emergency Medicine | Admitting: Emergency Medicine

## 2019-05-03 DIAGNOSIS — J449 Chronic obstructive pulmonary disease, unspecified: Secondary | ICD-10-CM | POA: Insufficient documentation

## 2019-05-03 DIAGNOSIS — Z87891 Personal history of nicotine dependence: Secondary | ICD-10-CM | POA: Insufficient documentation

## 2019-05-03 DIAGNOSIS — R339 Retention of urine, unspecified: Secondary | ICD-10-CM | POA: Insufficient documentation

## 2019-05-03 DIAGNOSIS — K5900 Constipation, unspecified: Secondary | ICD-10-CM | POA: Insufficient documentation

## 2019-05-03 DIAGNOSIS — Z79899 Other long term (current) drug therapy: Secondary | ICD-10-CM | POA: Insufficient documentation

## 2019-05-03 DIAGNOSIS — I10 Essential (primary) hypertension: Secondary | ICD-10-CM | POA: Insufficient documentation

## 2019-05-03 DIAGNOSIS — Z7984 Long term (current) use of oral hypoglycemic drugs: Secondary | ICD-10-CM | POA: Insufficient documentation

## 2019-05-03 DIAGNOSIS — R1030 Lower abdominal pain, unspecified: Secondary | ICD-10-CM | POA: Diagnosis not present

## 2019-05-03 DIAGNOSIS — Z8673 Personal history of transient ischemic attack (TIA), and cerebral infarction without residual deficits: Secondary | ICD-10-CM | POA: Diagnosis not present

## 2019-05-03 DIAGNOSIS — E039 Hypothyroidism, unspecified: Secondary | ICD-10-CM | POA: Diagnosis not present

## 2019-05-03 DIAGNOSIS — E119 Type 2 diabetes mellitus without complications: Secondary | ICD-10-CM | POA: Insufficient documentation

## 2019-05-03 LAB — URINALYSIS, COMPLETE (UACMP) WITH MICROSCOPIC
Bacteria, UA: NONE SEEN
Bilirubin Urine: NEGATIVE
Glucose, UA: NEGATIVE mg/dL
Ketones, ur: 5 mg/dL — AB
Nitrite: NEGATIVE
Protein, ur: NEGATIVE mg/dL
Specific Gravity, Urine: 1.008 (ref 1.005–1.030)
pH: 6 (ref 5.0–8.0)

## 2019-05-03 NOTE — ED Provider Notes (Signed)
[facility] Emergency Department Provider Note ____________________________________________   First MD Initiated Contact with Patient 05/03/19 [phone]     (approximate)  I have reviewed the triage vital signs and the nursing notes.   HISTORY  Chief Complaint Urinary Retention    HPI Paul Bass is a 73 y.o. male with PMH as noted below including BPH who presents with urinary retention, acute onset late yesterday and persistent since then.  It is associated with suprapubic area pain and with constipation.  The patient denies dysuria, hematuria, or fever.  The patient had a uro-lift procedure performed on 9/29 and subsequently developed urinary retention requiring an ED visit for Foley placement.  He was evaluated by Dr. Bernardo Heater yesterday and the Foley was removed.  He reports that he was urinating normally until late in the day when he again went into retention.   Past Medical History:  Diagnosis Date  . Anxiety   . BPH (benign prostatic hypertrophy)   . CAD S/P percutaneous coronary angioplasty   . COPD (chronic obstructive pulmonary disease) ([place])   . Depression   . Diabetes mellitus without complication ([place])   . Dyspnea   . GERD (gastroesophageal reflux disease)   . HTN (hypertension)   . Hyperlipidemia   . Hypothyroidism   . Stroke ([place])   . Tobacco abuse     Patient Active Problem List   Diagnosis Date Noted  . Abnormal ECG 04/21/2019  . Encounter for screening colonoscopy   . Polyp of colon   . Atherosclerotic heart disease of native coronary artery without angina pectoris 01/14/2019  . Depression 01/14/2019  . GERD (gastroesophageal reflux disease) 01/14/2019  . Periodic limb movement disorder (PLMD) 01/14/2019  . Sleep apnea 01/14/2019  . Type 2 diabetes mellitus without complications ([place]) 99991111  . Stricture and stenosis of esophagus   . Problems with swallowing and mastication   . Food impaction of esophagus   . H/O  coronary artery bypass surgery 01/17/2016  . Cerebrovascular accident (CVA) due to embolism ([place]) 01/03/2016  . Hypothyroidism, unspecified 01/03/2016  . HLD (hyperlipidemia) 01/03/2016  . Stable angina ([place]) 11/29/2015  . Benign essential hypertension 05/27/2015  . Bilateral carotid artery stenosis 02/24/2015  . Atherosclerotic peripheral vascular disease ([place]) 09/07/2014  . Bladder calculus 08/12/2014  . Primary osteoarthritis of left knee 03/10/2014  . Elevated prostate specific antigen (PSA) 02/05/2013  . Snoring 01/22/2013  . Sleepiness 01/22/2013  . Dysphagia, unspecified(787.20) 01/22/2013  . Occlusion and stenosis of carotid artery without mention of cerebral infarction 01/22/2013  . Cerebral thrombosis with cerebral infarction ([place]) 01/22/2013  . Benign prostatic hyperplasia with incomplete bladder emptying 08/26/2012  . Chronic prostatitis 08/26/2012  . Family history of malignant neoplasm of prostate 08/26/2012  . Incomplete emptying of bladder 08/26/2012  . Dysphagia 05/14/2012  . Global aphasia 05/14/2012  . Stroke, acute, thrombotic ([place]) 05/12/2012  . Symptomatic carotid artery stenosis 05/12/2012  . HTN (hypertension) 05/12/2012  . COPD (chronic obstructive pulmonary disease) ([place]) 05/12/2012  . Acute respiratory failure ([place]) 05/12/2012    Past Surgical History:  Procedure Laterality Date  . CARDIAC CATHETERIZATION N/A 12/07/2015   Procedure: Left Heart Cath and Coronary Angiography;  Surgeon: Corey Skains, MD;  Location: [facility];  Service: Cardiovascular;  Laterality: N/A;  . COLONOSCOPY WITH PROPOFOL N/A 02/18/2019   Procedure: COLONOSCOPY WITH PROPOFOL;  Surgeon: Lucilla Lame, MD;  Location: [facility] ENDOSCOPY;  Service: Endoscopy;  Laterality: N/A;  . CORONARY ANGIOPLASTY WITH STENT PLACEMENT  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  . CORONARY ARTERY BYPASS GRAFT    . CYSTOSCOPY WITH INSERTION OF UROLIFT N/A 04/29/2019   Procedure: CYSTOSCOPY WITH  INSERTION OF UROLIFT;  Surgeon: Abbie Sons, MD;  Location: [facility];  Service: Urology;  Laterality: N/A;  . ESOPHAGOGASTRODUODENOSCOPY (EGD) WITH PROPOFOL N/A 09/11/2016   Procedure: ESOPHAGOGASTRODUODENOSCOPY (EGD) WITH PROPOFOL;  Surgeon: Lucilla Lame, MD;  Location: [facility];  Service: Endoscopy;  Laterality: N/A;  . ESOPHAGOGASTRODUODENOSCOPY (EGD) WITH PROPOFOL N/A 10/17/2016   Procedure: ESOPHAGOGASTRODUODENOSCOPY (EGD) WITH PROPOFOL;  Surgeon: Lucilla Lame, MD;  Location: [facility];  Service: Endoscopy;  Laterality: N/A;  . ESOPHAGOGASTRODUODENOSCOPY (EGD) WITH PROPOFOL N/A 11/13/2017   Procedure: ESOPHAGOGASTRODUODENOSCOPY (EGD) WITH PROPOFOL;  Surgeon: Lucilla Lame, MD;  Location: [facility];  Service: Endoscopy;  Laterality: N/A;  . pins R lower leg    . rotator cuff replaced     right  . SAVORY DILATION N/A 10/17/2016   Procedure: SAVORY DILATION;  Surgeon: Lucilla Lame, MD;  Location: [facility]OS[facility];  Service: Endoscopy;  Laterality: N/A;    Prior to Admission medications   Medication Sig Start Date End Date Taking? Authorizing Provider  albuterol (PROVENTIL HFA;VENTOLIN HFA) 108 (90 BASE) MCG/ACT inhaler Inhale 2 puffs into the lungs every 6 (six) hours as needed for wheezing or shortness of breath. For shortness of breath    [provider]  atorvastatin (LIPITOR) 80 MG tablet Take 80 mg by mouth daily.  01/09/16   [provider]  budesonide-formoterol (SYMBICORT) 160-4.5 MCG/ACT inhaler Inhale 2 puffs into the lungs daily.     [provider]  buPROPion (WELLBUTRIN XL) 300 MG 24 hr tablet Take 300 mg by mouth at bedtime.  06/22/16   [provider]  cholecalciferol (VITAMIN D3) 25 MCG (1000 UT) tablet Take 1,000 Units by mouth every evening.    [provider]  Cinnamon Bark POWD Take 1,000 mg by mouth daily.     [provider]  citalopram (CELEXA) 20 MG tablet Take 20 mg by mouth daily.    [provider]  clonazePAM (KLONOPIN) 0.5 MG tablet Take 0.5 mg by mouth at bedtime as needed (restless leg syndrome).     [provider]  clopidogrel (PLAVIX) 75 MG tablet Take 75 mg by mouth daily.  12/21/15   [provider]  Coenzyme Q10 (COQ10) 100 MG CAPS Take 100 mg by mouth at bedtime.    [provider]  glucose blood test strip     [provider]  levothyroxine (SYNTHROID, LEVOTHROID) 100 MCG tablet Take 100 mcg by mouth daily before breakfast.    [provider]  losartan (COZAAR) 100 MG tablet Take 50 mg by mouth every evening.  01/02/19   [provider]  metFORMIN (GLUCOPHAGE) 500 MG tablet Take 500 mg by mouth 2 (two) times daily.  09/07/14   [provider]  Meth-Hyo-M Barnett Hatter Phos-Ph Sal (URIBEL) 118 MG CAPS Take 1 capsule (118 mg total) by mouth 3 (three) times daily as needed (Urinary frequency, urgency, burning). 04/29/19   Stoioff, Ronda Fairly, MD  Multiple Vitamin (MULTIVITAMIN WITH MINERALS) TABS Take 0.5 tablets by mouth 2 (two) times daily.    [provider]  naproxen sodium (ALEVE) 220 MG tablet Take 220-440 mg by mouth 2 (two) times daily as needed (pain.).    [provider]  Omega-3 Fatty Acids (FISH OIL) 1200 MG CAPS Take 1,000 mg by mouth 2 (two) times daily.    [provider]  pantoprazole (PROTONIX) 40 MG tablet TAKE 1 TABLET BY MOUTH EVERY DAY Patient taking differently: Take 40 mg by mouth at bedtime.  06/07/18   Lucilla Lame, MD  psyllium (METAMUCIL SMOOTH TEXTURE) 28 % packet Take 1 packet by mouth daily.    [provider]  RA KRILL OIL 500 MG CAPS Take 500 mg by mouth at bedtime.     [provider]  tamsulosin (FLOMAX) 0.4 MG CAPS capsule Take 0.4 mg by mouth 2 (two) times daily.  06/16/14   [provider]    Allergies Patient has no known allergies.  Family History  Problem Relation Age of Onset  . Heart failure Father   . Throat cancer Mother    . Diabetes Brother     Social History Social History   Tobacco Use  . Smoking status: Former Smoker    Packs/day: 2.00    Types: Cigarettes    Quit date: 05/20/2012    Years since quitting: 6.9  . Smokeless tobacco: Never Used  Substance Use Topics  . Alcohol use: Not Currently    Comment: weekend drinker, had 5 beers on 05/11/12, prior to stroke, No alcohol since stroke  . Drug use: Never    Review of Systems  Constitutional: No fever. Eyes: No redness. ENT: No sore throat. Cardiovascular: Denies chest pain. Respiratory: Denies shortness of breath. Gastrointestinal: No vomiting or diarrhea.  Genitourinary: Positive for urinary retention. Musculoskeletal: Negative for back pain. Skin: Negative for rash. Neurological: Negative for headache.   ____________________________________________   PHYSICAL EXAM:  VITAL SIGNS: ED Triage Vitals  Enc Vitals Group     BP 05/03/19 0228 128/69     Pulse Rate 05/03/19 0228 94     Resp 05/03/19 0228 20     Temp 05/03/19 0228 98.2 F (36.8 C)     Temp Source 05/03/19 0228 Oral     SpO2 05/03/19 0228 95 %     Weight 05/03/19 0244 230 lb (104.3 kg)     Height 05/03/19 0244 6' (1.829 m)     Head Circumference --      Peak Flow --      Pain Score 05/03/19 0244 10     Pain Loc --      Pain Edu? --      Excl. in [place]? --     Constitutional: Alert and oriented.  Relatively well appearing and in no acute distress. Eyes: Conjunctivae are normal.  Head: Atraumatic. Nose: No congestion/rhinnorhea. Mouth/Throat: Mucous membranes are moist.   Neck: Normal range of motion.  Cardiovascular: Good peripheral circulation. Respiratory: Normal respiratory effort.  No retractions.  Gastrointestinal: Soft and nontender. No distention.  Genitourinary: No flank tenderness. Musculoskeletal:  Extremities warm and well perfused.  Neurologic:  Normal speech and language. No gross focal neurologic deficits are appreciated.  Skin:  Skin is warm  and dry. No rash noted. Psychiatric: Mood and affect are normal. Speech and behavior are normal.  ____________________________________________   LABS (all labs ordered are listed, but only abnormal results are displayed)  Labs Reviewed  URINALYSIS, COMPLETE (UACMP) WITH MICROSCOPIC - Abnormal; Notable for the following components:      Result Value   Color, Urine GREEN (*)    APPearance CLEAR (*)    Hgb urine dipstick MODERATE (*)    Ketones, ur 5 (*)    Leukocytes,Ua TRACE (*)    All other components within normal limits   ____________________________________________  EKG   ____________________________________________  RADIOLOGY  XR abdomen: Nonobstructive gas pattern; no acute abnormality  ____________________________________________   PROCEDURES  Procedure(s) performed: No  Procedures  Critical Care performed: No ____________________________________________   INITIAL IMPRESSION / ASSESSMENT AND PLAN / ED COURSE  Pertinent labs & imaging results that were available during my care of the patient were reviewed by me and considered in my medical decision making (see chart for details).  73 year old male with a history of BPH presents with recurrent urinary retention since last night.  I reviewed the past medical records in epic.  The patient had a UroLift procedure on 04/29/2019 and subsequently developed urinary retention.  He was seen in the ED that evening and a Foley catheter was placed with return of a significant amount of urine.  He then followed up with Dr. Bernardo Heater from urology yesterday and the Foley was removed, but the retention occurred again later in the day.  The patient also reports significant constipation although he did have a small bowel movement earlier.  Overall presentation is consistent with recurrent urinary retention post procedure.  I suspect that the constipation is likely related to a combination of anesthesia and the bladder distention from  his urinary retention.  A Foley was placed and 800 cc of urine returned.  The patient reports improvement in his symptoms.  I will discuss with urology to determine further management.  ----------------------------------------- 10:31 AM on 05/03/2019 -----------------------------------------  I consulted Dr. Erlene Quan from urology over the phone and discussed the case with her.  She recommends that the Foley be kept in and the patient follow-up with Dr. Bernardo Heater this week.  The urinalysis shows no concerning infectious findings and per Dr. Erlene Quan there is no indication for other acute management.  Based on further discussion with the patient and his daughter, they are concerned about the constipation and we proceeded with an abdominal x-ray to further evaluate the extent of it.  The x-ray is unremarkable.  The patient has no significant stool burden and no clinical or imaging findings of obstruction.  The patient subsequently did have a bowel movement here in the ED.  At this time, the patient is stable for discharge.  Return precautions given, and he expresses understanding.  ____________________________________________   FINAL CLINICAL IMPRESSION(S) / ED DIAGNOSES  Final diagnoses:  Constipation  Urinary retention      NEW MEDICATIONS STARTED DURING THIS VISIT:  New Prescriptions   No medications on file     Note:  This document was prepared using Dragon voice recognition software and may include unintentional dictation errors.    Arta Silence, MD 05/03/19 1037

## 2019-05-03 NOTE — ED Triage Notes (Signed)
Reports had procedure for prostate done on Tuesday and then was unable to urinate.  Report since having foley cath removed has had difficulty urinating and now with abdominal pain.  Patient also reports having problems having a bowel movement.

## 2019-05-03 NOTE — ED Notes (Signed)
Patient's wife reports that patient took an entire bottle of magnesium at 1700, and a fleet enema at 1900. Patient was still unable to have a bowel movement.

## 2019-05-03 NOTE — ED Notes (Signed)
623 ml of urine in bladder on bladder scan.

## 2019-05-03 NOTE — Discharge Instructions (Addendum)
Keep the Foley catheter in place until you follow-up with the urologist.  Call Dr. Dene Gentry office on Monday to arrange for follow-up.  You can take another Fleet enema today, and a half bottle of magnesium citrate either later today or tomorrow, with the rest of the bottle after a few hours if you have no bowel movement.  Return to the ER for new, worsening, or persistent severe pain, constipation, vomiting, fever, weakness, or any other new or worsening symptoms that concern you.

## 2019-05-03 NOTE — ED Notes (Signed)
Patient to triage via wheelchair by EMS.  Patient had protstate surgery on Tuesday, seen in ED for urinary retention on Wednesday and had foley cath placed.  Since having cath removed, unable to urinate.  Also report have problems having a bowel movement.

## 2019-05-04 LAB — GLUCOSE, CAPILLARY: Glucose-Capillary: 144 mg/dL — ABNORMAL HIGH (ref 70–99)

## 2019-05-08 ENCOUNTER — Ambulatory Visit (INDEPENDENT_AMBULATORY_CARE_PROVIDER_SITE_OTHER): Payer: Medicare HMO | Admitting: *Deleted

## 2019-05-08 ENCOUNTER — Ambulatory Visit: Payer: Medicare HMO

## 2019-05-08 ENCOUNTER — Other Ambulatory Visit: Payer: Self-pay

## 2019-05-08 DIAGNOSIS — R338 Other retention of urine: Secondary | ICD-10-CM | POA: Diagnosis not present

## 2019-05-08 DIAGNOSIS — N401 Enlarged prostate with lower urinary tract symptoms: Secondary | ICD-10-CM

## 2019-05-08 LAB — BLADDER SCAN AMB NON-IMAGING

## 2019-05-08 NOTE — Progress Notes (Signed)
Catheter Removal  Patient is present today for a catheter removal.  62ml of water was drained from the balloon. A 16FR foley cath was removed from the bladder no complications were noted . Patient tolerated well.  Performed by: Verlene Mayer, CMA  Follow up/ Additional notes: return this afternoon for PVR per Dr. Bernardo Heater   Bladder Scan Patient can void: 95 ml Performed By: Verlene Mayer, CMA  Return for post op appointment per Dr. Bernardo Heater

## 2019-05-28 ENCOUNTER — Ambulatory Visit: Payer: Medicare HMO | Admitting: Urology

## 2019-05-30 ENCOUNTER — Ambulatory Visit (INDEPENDENT_AMBULATORY_CARE_PROVIDER_SITE_OTHER): Payer: Medicare HMO | Admitting: Urology

## 2019-05-30 ENCOUNTER — Encounter: Payer: Self-pay | Admitting: Urology

## 2019-05-30 ENCOUNTER — Other Ambulatory Visit: Payer: Self-pay

## 2019-05-30 VITALS — BP 133/74 | HR 72 | Ht 71.0 in | Wt 231.0 lb

## 2019-05-30 DIAGNOSIS — Z9889 Other specified postprocedural states: Secondary | ICD-10-CM | POA: Diagnosis not present

## 2019-05-30 DIAGNOSIS — N401 Enlarged prostate with lower urinary tract symptoms: Secondary | ICD-10-CM

## 2019-05-30 DIAGNOSIS — R3914 Feeling of incomplete bladder emptying: Secondary | ICD-10-CM

## 2019-05-30 DIAGNOSIS — R350 Frequency of micturition: Secondary | ICD-10-CM | POA: Diagnosis not present

## 2019-05-30 LAB — URINALYSIS, COMPLETE
Bilirubin, UA: NEGATIVE
Glucose, UA: NEGATIVE
Ketones, UA: NEGATIVE
Nitrite, UA: NEGATIVE
Protein,UA: NEGATIVE
Specific Gravity, UA: 1.02 (ref 1.005–1.030)
Urobilinogen, Ur: 0.2 mg/dL (ref 0.2–1.0)
pH, UA: 6 (ref 5.0–7.5)

## 2019-05-30 LAB — MICROSCOPIC EXAMINATION
Epithelial Cells (non renal): NONE SEEN /hpf (ref 0–10)
WBC, UA: >30 /hpf — AB (ref 0–5)

## 2019-05-30 LAB — BLADDER SCAN AMB NON-IMAGING

## 2019-05-30 NOTE — Progress Notes (Signed)
05/30/2019 2:06 PM   Paul Bass Dec 10, 1945 [id number]  Referring provider: [name], NP [address] [place],  [place] [address]  Chief Complaint  Patient presents with  . Routine Post Op    HPI: 73 y.o. male status post UroLift and cystoscopic removal of a small bladder calculus on 04/29/2019.  He developed urinary retention needing a surgery requiring Foley catheter placement and required replacement of his catheter after initial removal.  His catheter was kept indwelling for an additional 1 week and was removed on 05/08/2019.  Since his catheter was removed he is having persistent voiding symptoms including urinary hesitancy, intermittent urinary stream, frequency and urgency.  IPSS completed today was 33/35.  Denies gross hematuria.   PMH: Past Medical History:  Diagnosis Date  . Anxiety   . BPH (benign prostatic hypertrophy)   . CAD S/P percutaneous coronary angioplasty   . COPD (chronic obstructive pulmonary disease) ([place])   . Depression   . Diabetes mellitus without complication ([place])   . Dyspnea   . GERD (gastroesophageal reflux disease)   . HTN (hypertension)   . Hyperlipidemia   . Hypothyroidism   . Stroke ([place])   . Tobacco abuse     Surgical History: Past Surgical History:  Procedure Laterality Date  . CARDIAC CATHETERIZATION N/A 12/07/2015   Procedure: Left Heart Cath and Coronary Angiography;  Surgeon: Corey Skains, MD;  Location: [facility];  Service: Cardiovascular;  Laterality: N/A;  . COLONOSCOPY WITH PROPOFOL N/A 02/18/2019   Procedure: COLONOSCOPY WITH PROPOFOL;  Surgeon: Lucilla Lame, MD;  Location: [facility]COPY;  Service: Endoscopy;  Laterality: N/A;  . 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 CORONARY ARTERY BYPASS GRAFT    . CYSTOSCOPY WITH INSERTION OF UROLIFT N/A 04/29/2019   Procedure: CYSTOSCOPY WITH INSERTION OF UROLIFT;  Surgeon: Abbie Sons, MD;  Location: [facility] ORS;   Service: Urology;  Laterality: N/A;  . ESOPHAGOGASTRODUODENOSCOPY (EGD) WITH PROPOFOL N/A 09/11/2016   Procedure: ESOPHAGOGASTRODUODENOSCOPY (EGD) WITH PROPOFOL;  Surgeon: Lucilla Lame, MD;  Location: [facility] ENDOSCOPY;  Service: Endoscopy;  Laterality: N/A;  . ESOPHAGOGASTRODUODENOSCOPY (EGD) WITH PROPOFOL N/A 10/17/2016   Procedure: ESOPHAGOGASTRODUODENOSCOPY (EGD) WITH PROPOFOL;  Surgeon: Lucilla Lame, MD;  Location: [facility] ENDOSCOPY;  Service: Endoscopy;  Laterality: N/A;  . ESOPHAGOGASTRODUODENOSCOPY (EGD) WITH PROPOFOL N/A 11/13/2017   Procedure: ESOPHAGOGASTRODUODENOSCOPY (EGD) WITH PROPOFOL;  Surgeon: Lucilla Lame, MD;  Location: [facility] ENDOSCOPY;  Service: Endoscopy;  Laterality: N/A;  . pins R lower leg    . rotator cuff replaced     right  . SAVORY DILATION N/A 10/17/2016   Procedure: SAVORY DILATION;  Surgeon: Lucilla Lame, MD;  Location: [facility] ENDOSCOPY;  Service: Endoscopy;  Laterality: N/A;    Home Medications:  Allergies as of 05/30/2019   No Known Allergies     Medication List       Accurate as of May 30, 2019  2:06 PM. If you have any questions, ask your nurse or doctor.        albuterol 108 (90 Base) MCG/ACT inhaler Commonly known as: VENTOLIN HFA Inhale 2 puffs into the lungs every 6 (six) hours as needed for wheezing or shortness of breath. For shortness of breath   atorvastatin 80 MG tablet Commonly known as: LIPITOR Take 80 mg by mouth daily.   budesonide-formoterol 160-4.5 MCG/ACT inhaler Commonly known as: SYMBICORT Inhale 2 puffs into the lungs daily.   buPROPion 300 MG 24  hr tablet Commonly known as: WELLBUTRIN XL Take 300 mg by mouth at bedtime.   cholecalciferol 25 MCG (1000 UT) tablet Commonly known as: VITAMIN D3 Take 1,000 Units by mouth every evening.   Cinnamon Bark Powd Take 1,000 mg by mouth daily.   citalopram 20 MG tablet Commonly known as: CELEXA Take 20 mg by mouth daily.   clonazePAM 0.5 MG tablet Commonly known as: KLONOPIN Take  0.5 mg by mouth at bedtime as needed (restless leg syndrome).   clopidogrel 75 MG tablet Commonly known as: PLAVIX Take 75 mg by mouth daily.   CoQ10 100 MG Caps Take 100 mg by mouth at bedtime.   Fish Oil 1200 MG Caps Take 1,000 mg by mouth 2 (two) times daily.   glucose blood test strip   levothyroxine 100 MCG tablet Commonly known as: SYNTHROID Take 100 mcg by mouth daily before breakfast.   losartan 100 MG tablet Commonly known as: COZAAR Take 50 mg by mouth every evening.   metFORMIN 500 MG tablet Commonly known as: GLUCOPHAGE Take 500 mg by mouth 2 (two) times daily.   multivitamin with minerals Tabs tablet Take 0.5 tablets by mouth 2 (two) times daily.   naproxen sodium 220 MG tablet Commonly known as: ALEVE Take 220-440 mg by mouth 2 (two) times daily as needed (pain.).   pantoprazole 40 MG tablet Commonly known as: PROTONIX TAKE 1 TABLET BY MOUTH EVERY DAY What changed: when to take this   psyllium 28 % packet Commonly known as: METAMUCIL SMOOTH TEXTURE Take 1 packet by mouth daily.   RA Krill Oil 500 MG Caps Take 500 mg by mouth at bedtime.   tamsulosin 0.4 MG Caps capsule Commonly known as: FLOMAX Take 0.4 mg by mouth 2 (two) times daily.   Uribel 118 MG Caps Take 1 capsule (118 mg total) by mouth 3 (three) times daily as needed (Urinary frequency, urgency, burning).       Allergies: No Known Allergies  Family History: Family History  Problem Relation Age of Onset  . Heart failure Father   . Throat cancer Mother   . Diabetes Brother     Social History:  reports that he quit smoking about 7 years ago. His smoking use included cigarettes. He smoked 2.00 packs per day. He has never used smokeless tobacco. He reports previous alcohol use. He reports that he does not use drugs.  ROS: UROLOGY Frequent Urination?: Yes Hard to postpone urination?: Yes Burning/pain with urination?: No Get up at night to urinate?: Yes Leakage of urine?: Yes  Urine stream starts and stops?: Yes Trouble starting stream?: Yes Do you have to strain to urinate?: Yes Blood in urine?: No Urinary tract infection?: No Sexually transmitted disease?: No Injury to kidneys or bladder?: No Painful intercourse?: No Weak stream?: Yes Erection problems?: No Penile pain?: No  Gastrointestinal Nausea?: No Vomiting?: No Indigestion/heartburn?: No Diarrhea?: No Constipation?: No  Constitutional Fever: No Night sweats?: No Weight loss?: No Fatigue?: No  Skin Skin rash/lesions?: No Itching?: No  Eyes Blurred vision?: No Double vision?: No  Ears/Nose/Throat Sore throat?: No Sinus problems?: No  Hematologic/Lymphatic Swollen glands?: No Easy bruising?: Yes  Cardiovascular Leg swelling?: No Chest pain?: No  Respiratory Cough?: No Shortness of breath?: No  Endocrine Excessive thirst?: No  Musculoskeletal Back pain?: Yes Joint pain?: No  Neurological Headaches?: No Dizziness?: No  Psychologic Depression?: Yes Anxiety?: No  Physical Exam: BP 133/74 (BP Location: Left Arm, Patient Position: Sitting, Cuff Size: Normal)   Pulse  72   Ht 5\' 11"  (1.803 m)   Wt 231 lb (104.8 kg)   BMI 32.22 kg/m   Constitutional:  Alert and oriented, No acute distress. HEENT: [place] AT, moist mucus membranes.  Trachea midline, no masses. Cardiovascular: No clubbing, cyanosis, or edema. Respiratory: Normal respiratory effort, no increased work of breathing.   Laboratory Data:  Urinalysis Dipstick trace blood/1+ leukocytes/nitrite negative Microscopy >30 WBCs  Assessment & Plan:   73 y.o. male status post UroLift with persistent voiding symptoms.  PVR by bladder scan today was 173 mL.  He has discontinued his tamsulosin and I recommended he restart for now.  Urinalysis with significant pyuria and urine culture was ordered.  If culture positive for infection will reassess his voiding after treatment complete.  If the culture is negative will  schedule cystoscopy.   Abbie Sons, [place] [address], [place], [place] [address] [phone]

## 2019-05-31 ENCOUNTER — Encounter: Payer: Self-pay | Admitting: Urology

## 2019-06-02 ENCOUNTER — Ambulatory Visit: Payer: Medicare HMO

## 2019-06-04 LAB — CULTURE, URINE COMPREHENSIVE

## 2019-06-05 ENCOUNTER — Telehealth: Payer: Self-pay | Admitting: Urology

## 2019-06-05 DIAGNOSIS — Z87440 Personal history of urinary (tract) infections: Secondary | ICD-10-CM

## 2019-06-05 MED ORDER — CIPROFLOXACIN HCL 500 MG PO TABS: 500.0000 mg | ORAL_TABLET | Freq: Two times a day (BID) | ORAL | 0 refills | Status: AC

## 2019-06-05 NOTE — Telephone Encounter (Signed)
Urine culture was positive for infection.  Antibiotic was sent to pharmacy and hopefully treatment will improve his urinary symptoms.  Would recommend a follow-up urinalysis after he completes his antibiotic.

## 2019-06-05 NOTE — Telephone Encounter (Signed)
Called pt's home and mobile. No answer. Unable to leave message as no DPR is one file. 1st attempt.

## 2019-06-06 NOTE — Telephone Encounter (Signed)
Incoming call from pt's wife requesting urine results. Results given as she is listed as pt's emergency contact. Wife gave verbal understanding, f/u lab visit made. Order placed.

## 2019-06-16 ENCOUNTER — Other Ambulatory Visit: Payer: Self-pay

## 2019-06-16 ENCOUNTER — Other Ambulatory Visit: Payer: Medicare HMO

## 2019-06-16 DIAGNOSIS — Z87440 Personal history of urinary (tract) infections: Secondary | ICD-10-CM

## 2019-06-16 LAB — URINALYSIS, COMPLETE
Bilirubin, UA: NEGATIVE
Glucose, UA: NEGATIVE
Ketones, UA: NEGATIVE
Leukocytes,UA: NEGATIVE
Nitrite, UA: NEGATIVE
Protein,UA: NEGATIVE
RBC, UA: NEGATIVE
Specific Gravity, UA: 1.015 (ref 1.005–1.030)
Urobilinogen, Ur: 0.2 mg/dL (ref 0.2–1.0)
pH, UA: 7 (ref 5.0–7.5)

## 2019-06-17 ENCOUNTER — Telehealth: Payer: Self-pay

## 2019-06-17 NOTE — Telephone Encounter (Signed)
-----   Message from Abbie Sons, MD sent at 06/16/2019  8:50 PM EST ----- Urinalysis was clear.  Have his symptoms improved?

## 2019-06-17 NOTE — Telephone Encounter (Signed)
Called pt informed him of the information below. Pt gave verbal understanding and states that he is doing much better.

## 2019-08-25 ENCOUNTER — Other Ambulatory Visit: Payer: Self-pay | Admitting: Gastroenterology

## 2019-08-26 ENCOUNTER — Other Ambulatory Visit: Payer: Self-pay

## 2019-08-26 ENCOUNTER — Encounter: Payer: Self-pay | Admitting: Physician Assistant

## 2019-08-26 ENCOUNTER — Ambulatory Visit: Payer: Medicare HMO | Admitting: Physician Assistant

## 2019-08-26 VITALS — BP 167/66 | HR 116 | Ht 72.0 in | Wt 233.0 lb

## 2019-08-26 DIAGNOSIS — N401 Enlarged prostate with lower urinary tract symptoms: Secondary | ICD-10-CM | POA: Diagnosis not present

## 2019-08-26 DIAGNOSIS — R350 Frequency of micturition: Secondary | ICD-10-CM

## 2019-08-26 MED ORDER — SULFAMETHOXAZOLE-TRIMETHOPRIM 800-160 MG PO TABS: 1.0000 | ORAL_TABLET | Freq: Two times a day (BID) | ORAL | 0 refills | Status: AC

## 2019-08-26 MED ORDER — TAMSULOSIN HCL 0.4 MG PO CAPS: 0.4000 mg | ORAL_CAPSULE | Freq: Every day | ORAL | 2 refills | Status: DC

## 2019-08-26 NOTE — Progress Notes (Signed)
08/26/2019 2:54 PM   Paul Bass 08-04-1945 [id number]  CC: Urinary frequency, difficulty urinating  HPI: Paul Bass is a 74 y.o. male who presents today for evaluation of possible UTI.  PMH significant for BPH, s/p UroLift, most recently seen by Dr. Bernardo Heater on 05/30/2019.  At that time, he reported hesitancy, intermittency, frequency, and urgency associated with recent Foley catheterization.  Urine culture resulted with 2 biotypes of Pseudomonas; he was treated for UTI with Cipro 500 mg twice daily x7 days.  Today, he reports a 2-day history of difficulty urinating and urinary frequency.  He feels he is straining to urinate and is concerned that he is not emptying his bladder.  He denies dysuria, gross hematuria, fevers, chills, nausea, vomiting, and flank pain.  He has not taken any medications at home to treat his symptoms.  Additionally, he reports that he has been taking Flomax 0.4 mg daily and that he is due for refill of this medication.  In-office UA today positive for trace-lysed blood, 1+ protein, nitrites, and 1+ leukocyte esterase; urine microscopy with >30 WBCs/HPF. PVR 157mL.  PMH: Past Medical History:  Diagnosis Date  . Anxiety   . BPH (benign prostatic hypertrophy)   . CAD S/P percutaneous coronary angioplasty   . COPD (chronic obstructive pulmonary disease) ([place])   . Depression   . Diabetes mellitus without complication ([place])   . Dyspnea   . GERD (gastroesophageal reflux disease)   . HTN (hypertension)   . Hyperlipidemia   . Hypothyroidism   . Stroke ([place])   . Tobacco abuse     Surgical History: Past Surgical History:  Procedure Laterality Date  . CARDIAC CATHETERIZATION N/A 12/07/2015   Procedure: Left Heart Cath and Coronary Angiography;  Surgeon: Corey Skains, MD;  Location: [facility];  Service: Cardiovascular;  Laterality: N/A;  . COLONOSCOPY WITH PROPOFOL N/A 02/18/2019   Procedure: COLONOSCOPY WITH PROPOFOL;  Surgeon: Lucilla Lame, MD;  Location: [facility] ENDOSCOPY;  Service: Endoscopy;  Laterality: N/A;  . 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 CORONARY ARTERY BYPASS GRAFT    . CYSTOSCOPY WITH INSERTION OF UROLIFT N/A 04/29/2019   Procedure: CYSTOSCOPY WITH INSERTION OF UROLIFT;  Surgeon: Abbie Sons, MD;  Location: [facility] ORS;  Service: Urology;  Laterality: N/A;  . ESOPHAGOGASTRODUODENOSCOPY (EGD) WITH PROPOFOL N/A 09/11/2016   Procedure: ESOPHAGOGASTRODUODENOSCOPY (EGD) WITH PROPOFOL;  Surgeon: Lucilla Lame, MD;  Location: [facility] ENDOSCOPY;  Service: Endoscopy;  Laterality: N/A;  . ESOPHAGOGASTRODUODENOSCOPY (EGD) WITH PROPOFOL N/A 10/17/2016   Procedure: ESOPHAGOGASTRODUODENOSCOPY (EGD) WITH PROPOFOL;  Surgeon: Lucilla Lame, MD;  Location: [facility] ENDOSCOPY;  Service: Endoscopy;  Laterality: N/A;  . ESOPHAGOGASTRODUODENOSCOPY (EGD) WITH PROPOFOL N/A 11/13/2017   Procedure: ESOPHAGOGASTRODUODENOSCOPY (EGD) WITH PROPOFOL;  Surgeon: Lucilla Lame, MD;  Location: [facility] ENDOSCOPY;  Service: Endoscopy;  Laterality: N/A;  . pins R lower leg    . rotator cuff replaced     right  . SAVORY DILATION N/A 10/17/2016   Procedure: SAVORY DILATION;  Surgeon: Lucilla Lame, MD;  Location: [facility] ENDOSCOPY;  Service: Endoscopy;  Laterality: N/A;    Home Medications:  Allergies as of 08/26/2019   No Known Allergies     Medication List       Accurate as of August 26, 2019  2:54 PM. If you have any questions, ask your nurse or doctor.        albuterol 108 (90 Base) MCG/ACT inhaler Commonly known as:  VENTOLIN HFA Inhale 2 puffs into the lungs every 6 (six) hours as needed for wheezing or shortness of breath. For shortness of breath   atorvastatin 80 MG tablet Commonly known as: LIPITOR Take 80 mg by mouth daily.   budesonide-formoterol 160-4.5 MCG/ACT inhaler Commonly known as: SYMBICORT Inhale 2 puffs into the lungs daily.   buPROPion 300 MG 24 hr tablet Commonly known as: WELLBUTRIN  XL Take 300 mg by mouth at bedtime.   cholecalciferol 25 MCG (1000 UNIT) tablet Commonly known as: VITAMIN D3 Take 1,000 Units by mouth every evening.   Cinnamon Bark Powd Take 1,000 mg by mouth daily.   citalopram 20 MG tablet Commonly known as: CELEXA Take 20 mg by mouth daily.   clonazePAM 0.5 MG tablet Commonly known as: KLONOPIN Take 0.5 mg by mouth at bedtime as needed (restless leg syndrome).   clopidogrel 75 MG tablet Commonly known as: PLAVIX Take 75 mg by mouth daily.   CoQ10 100 MG Caps Take 100 mg by mouth at bedtime.   Fish Oil 1200 MG Caps Take 1,000 mg by mouth 2 (two) times daily.   glucose blood test strip   levothyroxine 100 MCG tablet Commonly known as: SYNTHROID Take 100 mcg by mouth daily before breakfast.   losartan 100 MG tablet Commonly known as: COZAAR Take 50 mg by mouth every evening.   metFORMIN 500 MG tablet Commonly known as: GLUCOPHAGE Take 500 mg by mouth 2 (two) times daily.   multivitamin with minerals Tabs tablet Take 0.5 tablets by mouth 2 (two) times daily.   naproxen sodium 220 MG tablet Commonly known as: ALEVE Take 220-440 mg by mouth 2 (two) times daily as needed (pain.).   pantoprazole 40 MG tablet Commonly known as: PROTONIX Take 1 tablet (40 mg total) by mouth at bedtime. Needs appointment   psyllium 28 % packet Commonly known as: METAMUCIL SMOOTH TEXTURE Take 1 packet by mouth daily.   RA Krill Oil 500 MG Caps Take 500 mg by mouth at bedtime.   sulfamethoxazole-trimethoprim 800-160 MG tablet Commonly known as: BACTRIM DS Take 1 tablet by mouth 2 (two) times daily for 10 days. Started by: Debroah Loop, PA-C   tamsulosin 0.4 MG Caps capsule Commonly known as: FLOMAX Take 1 capsule (0.4 mg total) by mouth daily. What changed: when to take this Changed by: Debroah Loop, PA-C   Uribel 118 MG Caps Take 1 capsule (118 mg total) by mouth 3 (three) times daily as needed (Urinary frequency,  urgency, burning).       Allergies:  No Known Allergies  Family History: Family History  Problem Relation Age of Onset  . Heart failure Father   . Throat cancer Mother   . Diabetes Brother     Social History:   reports that he quit smoking about 7 years ago. His smoking use included cigarettes. He smoked 2.00 packs per day. He has never used smokeless tobacco. He reports previous alcohol use. He reports that he does not use drugs.  ROS: UROLOGY Frequent Urination?: Yes Hard to postpone urination?: Yes Burning/pain with urination?: No Get up at night to urinate?: Yes Leakage of urine?: Yes Urine stream starts and stops?: No Trouble starting stream?: No Do you have to strain to urinate?: No Blood in urine?: No Sexually transmitted disease?: No Injury to kidneys or bladder?: No Painful intercourse?: No Weak stream?: No Erection problems?: No Penile pain?: No  Gastrointestinal Nausea?: No Vomiting?: No Indigestion/heartburn?: No Diarrhea?: No Constipation?: No  Constitutional Fever:  No Night sweats?: No Weight loss?: No Fatigue?: No  Skin Skin rash/lesions?: No  Eyes Blurred vision?: No Double vision?: No  Ears/Nose/Throat Sore throat?: No Sinus problems?: No  Hematologic/Lymphatic Swollen glands?: No Easy bruising?: No  Cardiovascular Chest pain?: No  Respiratory Cough?: No Shortness of breath?: No     Musculoskeletal Back pain?: No Joint pain?: No  Neurological Headaches?: No Dizziness?: Yes  Psychologic Depression?: Yes Anxiety?: No  Physical Exam: BP (!) 167/66   Pulse (!) 116   Ht 6' (1.829 m)   Wt 233 lb (105.7 kg)   BMI 31.60 kg/m   Constitutional:  Alert and oriented, no acute distress, nontoxic appearing HEENT: [place], AT Cardiovascular: No clubbing, cyanosis, or edema Respiratory: Normal respiratory effort, no increased work of breathing Skin: No rashes, bruises or suspicious lesions Neurologic: Grossly intact, no  focal deficits, moving all 4 extremities Psychiatric: Normal mood and affect  Laboratory Data: Results for orders placed or performed in visit on 08/26/19  Microscopic Examination   URINE  Result Value Ref Range   WBC, UA >30 (A) 0 - 5 /hpf   RBC None seen 0 - 2 /hpf   Epithelial Cells (non renal) 0-10 0 - 10 /hpf   Casts Present (A) None seen /lpf   Cast Type Hyaline casts N/A   Mucus, UA Present (A) Not Estab.   Bacteria, UA Few None seen/Few  Urinalysis, Complete  Result Value Ref Range   Specific Gravity, UA 1.015 1.005 - 1.030   pH, UA 6.5 5.0 - 7.5   Color, UA Yellow Yellow   Appearance Ur Cloudy (A) Clear   Leukocytes,UA 1+ (A) Negative   Protein,UA 1+ (A) Negative/Trace   Glucose, UA Negative Negative   Ketones, UA Negative Negative   RBC, UA Trace (A) Negative   Bilirubin, UA Negative Negative   Urobilinogen, Ur 0.2 0.2 - 1.0 mg/dL   Nitrite, UA Positive (A) Negative   Microscopic Examination See below:    Assessment & Plan:   1. Urinary frequency 74 year old male with PMH BPH s/p UroLift presents with a 2-day history of urinary frequency and difficulty urinating.  PVR WNL; I am not concerned for urinary retention.  UA significant for pyuria and nitrites; will send for culture.  Will start him on empiric Bactrim today.  I counseled him that I would contact him if his urine culture indicated a necessary change in therapy.  He expressed understanding. - Urinalysis, Complete - CULTURE, URINE COMPREHENSIVE - BLADDER SCAN AMB NON-IMAGING - sulfamethoxazole-trimethoprim (BACTRIM DS) 800-160 MG tablet; Take 1 tablet by mouth 2 (two) times daily for 10 days.  Dispense: 20 tablet; Refill: 0  2. Benign localized prostatic hyperplasia with lower urinary tract symptoms (LUTS) Patient was previously counseled to take Flomax twice daily.  He is only taking this once daily with PVR WNL.  Will refill x3 months today. - tamsulosin (FLOMAX) 0.4 MG CAPS capsule; Take 1 capsule (0.4 mg  total) by mouth daily.  Dispense: 30 capsule; Refill: 2   Return if symptoms worsen or fail to improve.  Debroah Loop, PA-C  [facility] [address] [place], [place] [address] [phone]

## 2019-08-27 LAB — MICROSCOPIC EXAMINATION
RBC, Urine: NONE SEEN /hpf (ref 0–2)
WBC, UA: >30 /hpf — AB (ref 0–5)

## 2019-08-27 LAB — URINALYSIS, COMPLETE
Bilirubin, UA: NEGATIVE
Glucose, UA: NEGATIVE
Ketones, UA: NEGATIVE
Nitrite, UA: POSITIVE — AB
Specific Gravity, UA: 1.015 (ref 1.005–1.030)
Urobilinogen, Ur: 0.2 mg/dL (ref 0.2–1.0)
pH, UA: 6.5 (ref 5.0–7.5)

## 2019-08-29 ENCOUNTER — Telehealth: Payer: Self-pay | Admitting: Physician Assistant

## 2019-08-29 LAB — CULTURE, URINE COMPREHENSIVE

## 2019-08-29 MED ORDER — CIPROFLOXACIN HCL 500 MG PO TABS: 500.0000 mg | ORAL_TABLET | Freq: Two times a day (BID) | ORAL | 0 refills | Status: AC

## 2019-08-29 NOTE — Telephone Encounter (Signed)
Please contact the patient and inform him that his urine culture has resulted and indicates a necessary change in his antibiotic therapy.  I have sent ciprofloxacin 500 mg twice daily x7 days to [facility].  I would like him to stop the Bactrim and start Cipro as soon as possible.

## 2019-08-29 NOTE — Telephone Encounter (Signed)
Patient informed, voiced understanding.  °

## 2019-09-17 ENCOUNTER — Other Ambulatory Visit: Payer: Self-pay | Admitting: Gastroenterology

## 2019-09-17 ENCOUNTER — Other Ambulatory Visit: Payer: Self-pay | Admitting: Neurology

## 2019-09-17 DIAGNOSIS — I69319 Unspecified symptoms and signs involving cognitive functions following cerebral infarction: Secondary | ICD-10-CM

## 2019-10-01 ENCOUNTER — Other Ambulatory Visit: Payer: Self-pay | Admitting: Gastroenterology

## 2019-10-02 ENCOUNTER — Other Ambulatory Visit: Payer: Self-pay | Admitting: Gastroenterology

## 2019-10-03 ENCOUNTER — Other Ambulatory Visit: Payer: Self-pay

## 2019-10-03 ENCOUNTER — Telehealth: Payer: Self-pay | Admitting: Gastroenterology

## 2019-10-03 ENCOUNTER — Ambulatory Visit (HOSPITAL_COMMUNITY)
Admission: RE | Admit: 2019-10-03 | Discharge: 2019-10-03 | Disposition: A | Discharge status: Home / Self Care | Payer: Medicare HMO | Source: Ambulatory Visit | Attending: Neurology | Admitting: Neurology

## 2019-10-03 DIAGNOSIS — I69319 Unspecified symptoms and signs involving cognitive functions following cerebral infarction: Secondary | ICD-10-CM | POA: Diagnosis not present

## 2019-10-03 MED ORDER — PANTOPRAZOLE SODIUM 40 MG PO TBEC: 40.0000 mg | DELAYED_RELEASE_TABLET | Freq: Every day | ORAL | 0 refills | Status: DC

## 2019-10-03 NOTE — Telephone Encounter (Signed)
Pt wife left vm to get a refill on pt rx for Acid reflux pt has contacted his insurance card

## 2019-10-03 NOTE — Telephone Encounter (Signed)
Left vm for pt's wife to return call and schedule a follow up appt for a medication refill appt. 30 day supply has been sent to pt's pharmacy on file.

## 2019-10-25 ENCOUNTER — Other Ambulatory Visit: Payer: Self-pay | Admitting: Gastroenterology

## 2019-11-17 ENCOUNTER — Other Ambulatory Visit: Payer: Self-pay | Admitting: Physician Assistant

## 2019-11-17 DIAGNOSIS — N401 Enlarged prostate with lower urinary tract symptoms: Secondary | ICD-10-CM

## 2019-11-19 ENCOUNTER — Other Ambulatory Visit: Payer: Self-pay | Admitting: Gastroenterology

## 2019-11-27 ENCOUNTER — Ambulatory Visit: Payer: Medicare HMO | Admitting: Gastroenterology

## 2019-11-27 ENCOUNTER — Encounter: Payer: Self-pay | Admitting: Gastroenterology

## 2019-11-27 ENCOUNTER — Other Ambulatory Visit: Payer: Self-pay

## 2019-11-27 VITALS — BP 129/70 | HR 60 | Temp 98.5°F | Ht 72.0 in | Wt 228.1 lb

## 2019-11-27 DIAGNOSIS — K219 Gastro-esophageal reflux disease without esophagitis: Secondary | ICD-10-CM

## 2019-11-27 MED ORDER — PANTOPRAZOLE SODIUM 40 MG PO TBEC: 40.0000 mg | DELAYED_RELEASE_TABLET | Freq: Every day | ORAL | 11 refills | Status: DC

## 2019-11-27 NOTE — Progress Notes (Signed)
Primary Care Physician: [name], NP  Primary Gastroenterologist:  Dr. Lucilla Lame  Chief Complaint  Patient presents with  . Gastroesophageal Reflux    Patient stated that he continues to have upper chest burning at times and would like a refill on his Pantoprazole    HPI: Paul Bass is a 74 y.o. male here with a history of heartburn.  The patient has been on Protonix.  The patient had an EGD by me in 2018 and in 2019 for dysphagia.  The patient also had a colonoscopy for screening purposes in 2020.  At the time of the endoscopy the patient was found to have a stricture that was dilated with a balloon.  The patient now comes in for an office visit prior to refilling his medication since he has not been seen in the office in some time.  The patient denies any dysphagia black stools bloody stools fevers chills nausea vomiting or abdominal pain.  Past Medical History:  Diagnosis Date  . Anxiety   . BPH (benign prostatic hypertrophy)   . CAD S/P percutaneous coronary angioplasty   . COPD (chronic obstructive pulmonary disease) ([place])   . Depression   . Diabetes mellitus without complication ([place])   . Dyspnea   . GERD (gastroesophageal reflux disease)   . HTN (hypertension)   . Hyperlipidemia   . Hypothyroidism   . Stroke ([place])   . Tobacco abuse     Current Outpatient Medications  Medication Sig Dispense Refill  . albuterol (PROVENTIL HFA;VENTOLIN HFA) 108 (90 BASE) MCG/ACT inhaler Inhale 2 puffs into the lungs every 6 (six) hours as needed for wheezing or shortness of breath. For shortness of breath    . atorvastatin (LIPITOR) 80 MG tablet Take 80 mg by mouth daily.     . budesonide-formoterol (SYMBICORT) 160-4.5 MCG/ACT inhaler Inhale 2 puffs into the lungs daily.     Marland Kitchen buPROPion (WELLBUTRIN XL) 300 MG 24 hr tablet Take 300 mg by mouth at bedtime.     . cholecalciferol (VITAMIN D3) 25 MCG (1000 UT) tablet Take 1,000 Units by mouth every evening.    Verneita Griffes  Bark POWD Take 1,000 mg by mouth daily.     . citalopram (CELEXA) 20 MG tablet Take 20 mg by mouth daily.    . clonazePAM (KLONOPIN) 0.5 MG tablet Take 0.5 mg by mouth at bedtime as needed (restless leg syndrome).     . clopidogrel (PLAVIX) 75 MG tablet Take 75 mg by mouth daily.   1  . Coenzyme Q10 (COQ10) 100 MG CAPS Take 100 mg by mouth at bedtime.    Marland Kitchen glucose blood test strip     . levothyroxine (SYNTHROID, LEVOTHROID) 100 MCG tablet Take 100 mcg by mouth daily before breakfast.    . memantine (NAMENDA) 5 MG tablet Take 1 tablet by mouth daily.    . metFORMIN (GLUCOPHAGE) 500 MG tablet Take 500 mg by mouth 2 (two) times daily.     . Meth-Hyo-M Bl-Na Phos-Ph Sal (URIBEL) 118 MG CAPS Take 1 capsule (118 mg total) by mouth 3 (three) times daily as needed (Urinary frequency, urgency, burning). 15 capsule 0  . Multiple Vitamin (MULTIVITAMIN WITH MINERALS) TABS Take 0.5 tablets by mouth 2 (two) times daily.    . naproxen sodium (ALEVE) 220 MG tablet Take 220-440 mg by mouth 2 (two) times daily as needed (pain.).    Marland Kitchen Omega-3 Fatty Acids (FISH OIL) 1200 MG CAPS Take 1,000 mg by mouth 2 (  two) times daily.    . pantoprazole (PROTONIX) 40 MG tablet TAKE 1 TABLET (40 MG TOTAL) BY MOUTH AT BEDTIME. **NEEDS APPOINTMENT** 30 tablet 6  . psyllium (METAMUCIL SMOOTH TEXTURE) 28 % packet Take 1 packet by mouth daily.    Marland Kitchen RA KRILL OIL 500 MG CAPS Take 500 mg by mouth at bedtime.     . tamsulosin (FLOMAX) 0.4 MG CAPS capsule TAKE 1 CAPSULE BY MOUTH EVERY DAY 90 capsule 0  . telmisartan (MICARDIS) 40 MG tablet Take 1 tablet by mouth 1 day or 1 dose.     No current facility-administered medications for this visit.    Allergies as of 11/27/2019  . (No Known Allergies)    ROS:  General: Negative for anorexia, weight loss, fever, chills, fatigue, weakness. ENT: Negative for hoarseness, difficulty swallowing , nasal congestion. CV: Negative for chest pain, angina, palpitations, dyspnea on exertion,  peripheral edema.  Respiratory: Negative for dyspnea at rest, dyspnea on exertion, cough, sputum, wheezing.  GI: See history of present illness. GU:  Negative for dysuria, hematuria, urinary incontinence, urinary frequency, nocturnal urination.  Endo: Negative for unusual weight change.    Physical Examination:   BP 129/70   Pulse 60   Temp 98.5 F (36.9 C) (Oral)   Ht 6' (1.829 m)   Wt 228 lb 1.6 oz (103.5 kg)   BMI 30.94 kg/m   General: Well-nourished, well-developed in no acute distress.  Eyes: No icterus. Conjunctivae pink. Lungs: Clear to auscultation bilaterally. Non-labored. Heart: Regular rate and rhythm, no murmurs rubs or gallops.  Abdomen: Bowel sounds are normal, nontender, nondistended, no hepatosplenomegaly or masses, no abdominal bruits or hernia , no rebound or guarding.   Extremities: No lower extremity edema. No clubbing or deformities. Neuro: Alert and oriented x 3.  Grossly intact. Skin: Warm and dry, no jaundice.   Psych: Alert and cooperative, normal mood and affect.  Labs:    Imaging Studies: No results found.  Assessment and Plan:   Paul Bass is a 74 y.o. y/o male who comes in today with a history of of GERD and dysphagia who is being well controlled on his PPI.  The patient is now here for refill of his PPI.  He is not have any worry symptoms at the present time.  The patient will have his medication refilled.  He has had all his questions answered and he agrees with the plan.     Lucilla Lame, MD. Marval Regal    Note: This dictation was prepared with Dragon dictation along with smaller phrase technology. Any transcriptional errors that result from this process are unintentional.

## 2019-12-11 ENCOUNTER — Ambulatory Visit: Payer: Medicare HMO | Attending: Neurology | Admitting: Speech Pathology

## 2019-12-11 ENCOUNTER — Other Ambulatory Visit: Payer: Self-pay

## 2019-12-11 ENCOUNTER — Encounter: Payer: Self-pay | Admitting: Speech Pathology

## 2019-12-11 DIAGNOSIS — R41841 Cognitive communication deficit: Secondary | ICD-10-CM | POA: Diagnosis present

## 2019-12-11 NOTE — Therapy (Signed)
[place] MAIN [facility] SERVICES [address] [place], [place], [address] Phone: [phone]   Fax:  [phone]  Speech Language Pathology Evaluation  Patient Details  Name: Paul Bass MRN: [id number] Date of Birth: 23-Apr-1946 Referring Provider (SLP): Dr. Manuella Ghazi   Encounter Date: 12/11/2019  End of Session - 12/11/19 1139    Visit Number  1    Number of Visits  17    Date for SLP Re-Evaluation  02/05/20    Authorization Type  Medicare    Authorization Time Period  Start 12/11/2019    Authorization - Visit Number  1    Authorization - Number of Visits  10    Progress Note Due on Visit  10    SLP Start Time  1000    SLP Stop Time   1050    SLP Time Calculation (min)  50 min    Activity Tolerance  Patient tolerated treatment well       Past Medical History:  Diagnosis Date  . Anxiety   . BPH (benign prostatic hypertrophy)   . CAD S/P percutaneous coronary angioplasty   . COPD (chronic obstructive pulmonary disease) ([place])   . Depression   . Diabetes mellitus without complication ([place])   . Dyspnea   . GERD (gastroesophageal reflux disease)   . HTN (hypertension)   . Hyperlipidemia   . Hypothyroidism   . Stroke ([place])   . Tobacco abuse     Past Surgical History:  Procedure Laterality Date  . CARDIAC CATHETERIZATION N/A 12/07/2015   Procedure: Left Heart Cath and Coronary Angiography;  Surgeon: Corey Skains, MD;  Location: [facility] LAB;  Service: Cardiovascular;  Laterality: N/A;  . COLONOSCOPY WITH PROPOFOL N/A 02/18/2019   Procedure: COLONOSCOPY WITH PROPOFOL;  Surgeon: Lucilla Lame, MD;  Location: [facility] ENDOSCOPY;  Service: Endoscopy;  Laterality: N/A;  . 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 CORONARY ARTERY BYPASS GRAFT    . CYSTOSCOPY WITH INSERTION OF UROLIFT N/A 04/29/2019   Procedure: CYSTOSCOPY WITH INSERTION OF UROLIFT;  Surgeon: Abbie Sons, MD;  Location: [facility] ORS;   Service: Urology;  Laterality: N/A;  . ESOPHAGOGASTRODUODENOSCOPY (EGD) WITH PROPOFOL N/A 09/11/2016   Procedure: ESOPHAGOGASTRODUODENOSCOPY (EGD) WITH PROPOFOL;  Surgeon: Lucilla Lame, MD;  Location: [facility] ENDOSCOPY;  Service: Endoscopy;  Laterality: N/A;  . ESOPHAGOGASTRODUODENOSCOPY (EGD) WITH PROPOFOL N/A 10/17/2016   Procedure: ESOPHAGOGASTRODUODENOSCOPY (EGD) WITH PROPOFOL;  Surgeon: Lucilla Lame, MD;  Location: [facility] ENDOSCOPY;  Service: Endoscopy;  Laterality: N/A;  . ESOPHAGOGASTRODUODENOSCOPY (EGD) WITH PROPOFOL N/A 11/13/2017   Procedure: ESOPHAGOGASTRODUODENOSCOPY (EGD) WITH PROPOFOL;  Surgeon: Lucilla Lame, MD;  Location: [facility] ENDOSCOPY;  Service: Endoscopy;  Laterality: N/A;  . pins R lower leg    . rotator cuff replaced     right  . SAVORY DILATION N/A 10/17/2016   Procedure: SAVORY DILATION;  Surgeon: Lucilla Lame, MD;  Location: [facility] ENDOSCOPY;  Service: Endoscopy;  Laterality: N/A;    There were no vitals filed for this visit.      SLP Evaluation [facility] - 12/11/19 0001      SLP Visit Information   SLP Received On  12/11/19    Referring Provider (SLP)  Dr. Manuella Ghazi    Onset Date  09/15/2019    Medical Diagnosis  Cognitive deficit post stroke      Subjective   Subjective  "I get confused in my head- I see something, think something but  I can't say it"    Patient/Family Stated Goal  Remember names and words, improved conversational skills, and legible writing      Pain Assessment   Pain Score  0-No pain      General Information   HPI  Paul Bass is 74 year old man, S/P CVA in 2013, referred for speech therapy for cognitive deficit post stroke.      Prior Functional Status   Cognitive/Linguistic Baseline  Baseline deficits    Baseline deficit details  Stroke in 2013      Cognition   Overall Cognitive Status  Impaired/Different from baseline      Auditory Comprehension   Overall Auditory Comprehension  Appears within functional limits for tasks assessed      Reading  Comprehension   Reading Status  Within funtional limits      Expression   Primary Mode of Expression  Verbal      Verbal Expression   Overall Verbal Expression  Impaired      Written Expression   Written Expression  Exceptions to [facility]    Overall Writen Expression  Poor legibility      Oral Motor/Sensory Function   Overall Oral Motor/Sensory Function  Appears within functional limits for tasks assessed      Motor Speech   Overall Motor Speech  Appears within functional limits for tasks assessed      Standardized Assessments   Standardized Assessments   Cognitive Linguistic Quick Test       Cognitive Linguistic Quick Test The Cognitive Linguistic Quick Test (CLQT) was administered to assess the relative status of five cognitive domains: attention, memory, language, executive functioning, and visuospatial skills. Scores from 10 tasks were used to estimate severity ratings (for age groups 18-69 years and 70-89 years) for each domain, a clock drawing task, as well as an overall composite severity rating of cognition.    Task    Score  Criterion Cut Scores Personal Facts  8/8   8  Symbol Cancellation    11/12   10 Confrontation Naming    10/10   10 Clock Drawing     10/13   11 Story Retelling      6/10   5 Symbol Trails      8/10   6 Generative Naming     3/9   4 Design Memory  5/6   4 Mazes        6/8   4 Design Generation    6/13   5  Cognitive Domain  Composite Score Severity Rating Attention   175/215  WNL  Memory   145/185  WNL Executive Function  23/40   WNL Language   27/37   Mild Visuospatial Skills  82/105   WNL Clock Drawing   10/13   Mild  Composite Severity Rating WNL    Additional Observations: The patient has functional reading skills, but handwriting is poorly legible.  SLP Education - 12/11/19 1138    Education Details  Results and POC    Person(s) Educated  Patient    Methods  Explanation    Comprehension  Verbalized understanding         SLP  Long Term Goals - 12/11/19 1143      SLP LONG TERM GOAL #1   Title  Patient will generate grammatical, fluent, and cogent sentences to complete abstract/complex linguistic task with 80% accuracy.    Time  8    Period  Weeks    Status  New  Target Date  02/05/20      SLP LONG TERM GOAL #2   Title  Patient will complete complex memory and high level wording activities with 80% accuracy.    Time  8    Period  Weeks    Status  New    Target Date  02/05/20      SLP LONG TERM GOAL #3   Title  Patient will write grammatical and cogent phrases to complete simple/concrete linguistic task with 80% accuracy.    Time  8    Period  Weeks    Status  New    Target Date  02/05/20       Plan - 12/11/19 1141    Clinical Impression Statement  This 74 year old man, with remote CVA, is presenting with mild cognitive communication impairment characterized by memory deficits (names / word recall) and vague, low content speech.  The results of the Cognitive Linguistic Quick Test (CLQT) indicate a composite severity rating of WNL.  For his age, the patient scored with mild impairment in the realms of language and clock drawing and within normal limits for attention, memory, executive function, and visuospatial skills.   The patient has functional reading skills, but handwriting is poorly legible.  The patient will benefit from skilled speech therapy for restorative and compensatory treatment of cognitive communication deficits.    Speech Therapy Frequency  2x / week    Duration  Other (comment)   8 weeks   Treatment/Interventions  Cognitive reorganization;Compensatory strategies;Language facilitation    Potential to Achieve Goals  Good    Potential Considerations  Ability to learn/carryover information;Pain level;Family/community support;Co-morbidities;Previous level of function;Cooperation/participation level;Severity of impairments    Consulted and Agree with Plan of Care  Patient       Patient will  benefit from skilled therapeutic intervention in order to improve the following deficits and impairments:   Cognitive communication deficit - Plan: SLP plan of care cert/re-cert    Problem List Patient Active Problem List   Diagnosis Date Noted  . Abnormal ECG 04/21/2019  . Encounter for screening colonoscopy   . Polyp of colon   . Atherosclerotic heart disease of native coronary artery without angina pectoris 01/14/2019  . Depression 01/14/2019  . GERD (gastroesophageal reflux disease) 01/14/2019  . Periodic limb movement disorder (PLMD) 01/14/2019  . Sleep apnea 01/14/2019  . Type 2 diabetes mellitus without complications ([place]) 99991111  . Stricture and stenosis of esophagus   . Problems with swallowing and mastication   . Food impaction of esophagus   . H/O coronary artery bypass surgery 01/17/2016  . Cerebrovascular accident (CVA) due to embolism ([place]) 01/03/2016  . Hypothyroidism, unspecified 01/03/2016  . HLD (hyperlipidemia) 01/03/2016  . Stable angina ([place]) 11/29/2015  . Benign essential hypertension 05/27/2015  . Bilateral carotid artery stenosis 02/24/2015  . Atherosclerotic peripheral vascular disease ([place]) 09/07/2014  . Bladder calculus 08/12/2014  . Primary osteoarthritis of left knee 03/10/2014  . Elevated prostate specific antigen (PSA) 02/05/2013  . Snoring 01/22/2013  . Sleepiness 01/22/2013  . Dysphagia, unspecified(787.20) 01/22/2013  . Occlusion and stenosis of carotid artery without mention of cerebral infarction 01/22/2013  . Cerebral thrombosis with cerebral infarction ([place]) 01/22/2013  . Benign prostatic hyperplasia with incomplete bladder emptying 08/26/2012  . Chronic prostatitis 08/26/2012  . Family history of malignant neoplasm of prostate 08/26/2012  . Incomplete emptying of bladder 08/26/2012  . Dysphagia 05/14/2012  . Global aphasia 05/14/2012  . Stroke, acute, thrombotic ([place]) 05/12/2012  . Symptomatic carotid  artery stenosis 05/12/2012  .  HTN (hypertension) 05/12/2012  . COPD (chronic obstructive pulmonary disease) ([place]) 05/12/2012  . Acute respiratory failure ([place]) 05/12/2012   Leroy Sea, MS/CCC- SLP  Lou Miner 12/11/2019, 11:50 AM  [place] MAIN [facility] SERVICES [address] [place], [place], [address] Phone: [phone]   Fax:  [phone]  Name: Paul Bass MRN: [id number] Date of Birth: July 20, 1946

## 2019-12-16 ENCOUNTER — Encounter: Payer: Self-pay | Admitting: Speech Pathology

## 2019-12-16 ENCOUNTER — Other Ambulatory Visit: Payer: Self-pay

## 2019-12-16 ENCOUNTER — Ambulatory Visit: Payer: Medicare HMO | Admitting: Speech Pathology

## 2019-12-16 DIAGNOSIS — R41841 Cognitive communication deficit: Secondary | ICD-10-CM

## 2019-12-16 NOTE — Therapy (Signed)
[place] MAIN [facility] SERVICES [address] [place], [place], [address] Phone: [phone]   Fax:  [phone]  Speech Language Pathology Treatment  Patient Details  Name: Paul Bass MRN: [id number] Date of Birth: 1946-03-15 Referring Provider (SLP): Dr. Manuella Ghazi   Encounter Date: 12/16/2019  End of Session - 12/16/19 1327    Visit Number  2    Number of Visits  17    Date for SLP Re-Evaluation  02/05/20    Authorization Type  Medicare    Authorization Time Period  Start 12/11/2019    Authorization - Visit Number  2    Authorization - Number of Visits  10    SLP Start Time  0900    SLP Stop Time   0950    SLP Time Calculation (min)  50 min    Activity Tolerance  Patient tolerated treatment well       Past Medical History:  Diagnosis Date  . Anxiety   . BPH (benign prostatic hypertrophy)   . CAD S/P percutaneous coronary angioplasty   . COPD (chronic obstructive pulmonary disease) ([place])   . Depression   . Diabetes mellitus without complication ([place])   . Dyspnea   . GERD (gastroesophageal reflux disease)   . HTN (hypertension)   . Hyperlipidemia   . Hypothyroidism   . Stroke ([place])   . Tobacco abuse     Past Surgical History:  Procedure Laterality Date  . CARDIAC CATHETERIZATION N/A 12/07/2015   Procedure: Left Heart Cath and Coronary Angiography;  Surgeon: Corey Skains, MD;  Location: [place] CV LAB;  Service: Cardiovascular;  Laterality: N/A;  . COLONOSCOPY WITH PROPOFOL N/A 02/18/2019   Procedure: COLONOSCOPY WITH PROPOFOL;  Surgeon: Lucilla Lame, MD;  Location: [facility] ENDOSCOPY;  Service: Endoscopy;  Laterality: N/A;  . 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 CORONARY ARTERY BYPASS GRAFT    . CYSTOSCOPY WITH INSERTION OF UROLIFT N/A 04/29/2019   Procedure: CYSTOSCOPY WITH INSERTION OF UROLIFT;  Surgeon: Abbie Sons, MD;  Location: [facility] ORS;  Service: Urology;  Laterality: N/A;   . ESOPHAGOGASTRODUODENOSCOPY (EGD) WITH PROPOFOL N/A 09/11/2016   Procedure: ESOPHAGOGASTRODUODENOSCOPY (EGD) WITH PROPOFOL;  Surgeon: Lucilla Lame, MD;  Location: [facility] ENDOSCOPY;  Service: Endoscopy;  Laterality: N/A;  . ESOPHAGOGASTRODUODENOSCOPY (EGD) WITH PROPOFOL N/A 10/17/2016   Procedure: ESOPHAGOGASTRODUODENOSCOPY (EGD) WITH PROPOFOL;  Surgeon: Lucilla Lame, MD;  Location: [facility] ENDOSCOPY;  Service: Endoscopy;  Laterality: N/A;  . ESOPHAGOGASTRODUODENOSCOPY (EGD) WITH PROPOFOL N/A 11/13/2017   Procedure: ESOPHAGOGASTRODUODENOSCOPY (EGD) WITH PROPOFOL;  Surgeon: Lucilla Lame, MD;  Location: [facility] ENDOSCOPY;  Service: Endoscopy;  Laterality: N/A;  . pins R lower leg    . rotator cuff replaced     right  . SAVORY DILATION N/A 10/17/2016   Procedure: SAVORY DILATION;  Surgeon: Lucilla Lame, MD;  Location: [facility] ENDOSCOPY;  Service: Endoscopy;  Laterality: N/A;    There were no vitals filed for this visit.  Subjective Assessment - 12/16/19 1326    Subjective  Patient eager to participate in [place] SLP TREATMENT - 12/16/19 0001      General Information   Behavior/Cognition  Alert;Cooperative;Pleasant mood    HPI   Paul Bass is 74 year old man, S/P CVA in 2013, referred for speech therapy for cognitive deficit post stroke.       Treatment Provided   Treatment provided  Cognitive-Linquistic      Pain Assessment   Pain Assessment  No/denies pain      Cognitive-Linquistic Treatment   Treatment focused on  Aphasia    Skilled Treatment  WRITING: Patient is able to copy single words with 50% legibility secondary small/cramped writing style.  Patient given large words to copy as homework. VERBAL EXPRESSION:  Answer general knowledge questions with 80% accuracy for clear/cogent responses. Name concrete categories given 3 members with 90% accuracy.  Complete simple verbal analogies with 80% accuracy.  State word given 2 meanings with 80% accuracy.  REMEBERING NAMES: Patient  asked to bring 10 pictures of people for practicing remembering names.      Assessment / Recommendations / Plan   Plan  Continue with current plan of care      Progression Toward Goals   Progression toward goals  Progressing toward goals       SLP Education - 12/16/19 1326    Education Details  word finding strategies, legibility strategies    Person(s) Educated  Patient    Methods  Explanation    Comprehension  Verbalized understanding         SLP Long Term Goals - 12/11/19 1143      SLP LONG TERM GOAL #1   Title  Patient will generate grammatical, fluent, and cogent sentences to complete abstract/complex linguistic task with 80% accuracy.    Time  8    Period  Weeks    Status  New    Target Date  02/05/20      SLP LONG TERM GOAL #2   Title  Patient will complete complex memory and high level wording activities with 80% accuracy.    Time  8    Period  Weeks    Status  New    Target Date  02/05/20      SLP LONG TERM GOAL #3   Title  Patient will write grammatical and cogent phrases to complete simple/concrete linguistic task with 80% accuracy.    Time  8    Period  Weeks    Status  New    Target Date  02/05/20       Plan - 12/16/19 1328    Clinical Impression Statement  The patient is eager to improve his self-expression, memory for names, and writing.    Speech Therapy Frequency  2x / week    Duration  Other (comment)    Treatment/Interventions  Cognitive reorganization;Compensatory strategies;Language facilitation    Potential to Achieve Goals  Good    Potential Considerations  Ability to learn/carryover information;Pain level;Family/community support;Co-morbidities;Previous level of function;Cooperation/participation level;Severity of impairments    Consulted and Agree with Plan of Care  Patient       Patient will benefit from skilled therapeutic intervention in order to improve the following deficits and impairments:   Cognitive communication  deficit    Problem List Patient Active Problem List   Diagnosis Date Noted  . Abnormal ECG 04/21/2019  . Encounter for screening colonoscopy   . Polyp of colon   . Atherosclerotic heart disease of native coronary artery without angina pectoris 01/14/2019  . Depression 01/14/2019  . GERD (gastroesophageal reflux disease) 01/14/2019  . Periodic limb movement disorder (PLMD) 01/14/2019  . Sleep apnea 01/14/2019  . Type 2 diabetes mellitus without complications ([place]) 99991111  . Stricture and stenosis of esophagus   . Problems with swallowing and mastication   . Food impaction of esophagus   . H/O coronary artery bypass  surgery 01/17/2016  . Cerebrovascular accident (CVA) due to embolism ([place]) 01/03/2016  . Hypothyroidism, unspecified 01/03/2016  . HLD (hyperlipidemia) 01/03/2016  . Stable angina ([place]) 11/29/2015  . Benign essential hypertension 05/27/2015  . Bilateral carotid artery stenosis 02/24/2015  . Atherosclerotic peripheral vascular disease ([place]) 09/07/2014  . Bladder calculus 08/12/2014  . Primary osteoarthritis of left knee 03/10/2014  . Elevated prostate specific antigen (PSA) 02/05/2013  . Snoring 01/22/2013  . Sleepiness 01/22/2013  . Dysphagia, unspecified(787.20) 01/22/2013  . Occlusion and stenosis of carotid artery without mention of cerebral infarction 01/22/2013  . Cerebral thrombosis with cerebral infarction ([place]) 01/22/2013  . Benign prostatic hyperplasia with incomplete bladder emptying 08/26/2012  . Chronic prostatitis 08/26/2012  . Family history of malignant neoplasm of prostate 08/26/2012  . Incomplete emptying of bladder 08/26/2012  . Dysphagia 05/14/2012  . Global aphasia 05/14/2012  . Stroke, acute, thrombotic ([place]) 05/12/2012  . Symptomatic carotid artery stenosis 05/12/2012  . HTN (hypertension) 05/12/2012  . COPD (chronic obstructive pulmonary disease) ([place]) 05/12/2012  . Acute respiratory failure ([place]) 05/12/2012   Leroy Sea,  MS/CCC- SLP  Lou Miner 12/16/2019, 1:29 PM  [place] MAIN [facility] SERVICES [address] [place], [place], [address] Phone: [phone]   Fax:  [phone]   Name: Paul Bass MRN: [id number] Date of Birth: 08/17/1945

## 2019-12-18 ENCOUNTER — Ambulatory Visit: Payer: Medicare HMO | Admitting: Speech Pathology

## 2019-12-18 ENCOUNTER — Other Ambulatory Visit: Payer: Self-pay

## 2019-12-18 ENCOUNTER — Encounter: Payer: Self-pay | Admitting: Speech Pathology

## 2019-12-18 DIAGNOSIS — R41841 Cognitive communication deficit: Secondary | ICD-10-CM

## 2019-12-18 NOTE — Therapy (Signed)
[place] MAIN [facility] SERVICES [address] [place], [place], [address] Phone: [phone]   Fax:  [phone]  Speech Language Pathology Treatment  Patient Details  Name: Paul Bass MRN: [id number] Date of Birth: 07-28-1946 Referring Provider (SLP): Dr. Manuella Ghazi   Encounter Date: 12/18/2019  End of Session - 12/18/19 1440    Visit Number  3    Number of Visits  17    Date for SLP Re-Evaluation  02/05/20    Authorization Type  Medicare    Authorization Time Period  Start 12/11/2019    Authorization - Visit Number  3    Authorization - Number of Visits  10    Progress Note Due on Visit  10    SLP Start Time  1000    SLP Stop Time   1050    SLP Time Calculation (min)  50 min    Activity Tolerance  Patient tolerated treatment well       Past Medical History:  Diagnosis Date  . Anxiety   . BPH (benign prostatic hypertrophy)   . CAD S/P percutaneous coronary angioplasty   . COPD (chronic obstructive pulmonary disease) ([place])   . Depression   . Diabetes mellitus without complication ([place])   . Dyspnea   . GERD (gastroesophageal reflux disease)   . HTN (hypertension)   . Hyperlipidemia   . Hypothyroidism   . Stroke ([place])   . Tobacco abuse     Past Surgical History:  Procedure Laterality Date  . CARDIAC CATHETERIZATION N/A 12/07/2015   Procedure: Left Heart Cath and Coronary Angiography;  Surgeon: Corey Skains, MD;  Location: [place] CV LAB;  Service: Cardiovascular;  Laterality: N/A;  . COLONOSCOPY WITH PROPOFOL N/A 02/18/2019   Procedure: COLONOSCOPY WITH PROPOFOL;  Surgeon: Lucilla Lame, MD;  Location: [facility] ENDOSCOPY;  Service: Endoscopy;  Laterality: N/A;  . 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 CORONARY ARTERY BYPASS GRAFT    . CYSTOSCOPY WITH INSERTION OF UROLIFT N/A 04/29/2019   Procedure: CYSTOSCOPY WITH INSERTION OF UROLIFT;  Surgeon: Abbie Sons, MD;  Location: [facility] ORS;   Service: Urology;  Laterality: N/A;  . ESOPHAGOGASTRODUODENOSCOPY (EGD) WITH PROPOFOL N/A 09/11/2016   Procedure: ESOPHAGOGASTRODUODENOSCOPY (EGD) WITH PROPOFOL;  Surgeon: Lucilla Lame, MD;  Location: [facility] ENDOSCOPY;  Service: Endoscopy;  Laterality: N/A;  . ESOPHAGOGASTRODUODENOSCOPY (EGD) WITH PROPOFOL N/A 10/17/2016   Procedure: ESOPHAGOGASTRODUODENOSCOPY (EGD) WITH PROPOFOL;  Surgeon: Lucilla Lame, MD;  Location: [facility] ENDOSCOPY;  Service: Endoscopy;  Laterality: N/A;  . ESOPHAGOGASTRODUODENOSCOPY (EGD) WITH PROPOFOL N/A 11/13/2017   Procedure: ESOPHAGOGASTRODUODENOSCOPY (EGD) WITH PROPOFOL;  Surgeon: Lucilla Lame, MD;  Location: [facility] ENDOSCOPY;  Service: Endoscopy;  Laterality: N/A;  . pins R lower leg    . rotator cuff replaced     right  . SAVORY DILATION N/A 10/17/2016   Procedure: SAVORY DILATION;  Surgeon: Lucilla Lame, MD;  Location: [facility] ENDOSCOPY;  Service: Endoscopy;  Laterality: N/A;    There were no vitals filed for this visit.  Subjective Assessment - 12/18/19 1439    Subjective  Patient eager to participate in [place] SLP TREATMENT - 12/18/19 0001      General Information   Behavior/Cognition  Alert;Cooperative;Pleasant mood    HPI   Paul Bass is 74 year old man, S/P CVA in 2013, referred for speech therapy for cognitive deficit post stroke.  Treatment Provided   Treatment provided  Cognitive-Linquistic      Pain Assessment   Pain Assessment  No/denies pain      Cognitive-Linquistic Treatment   Treatment focused on  Aphasia    Skilled Treatment  WRITING: Patient given large words to copy as homework. Patient had difficulty consistently keeping his letters big.  He was given lined paper with targeted size alphabet (upper and lower case) to model.   VERBAL EXPRESSION:  Answer general knowledge questions with 80% accuracy for clear/cogent responses. Name concrete categories given 3 members with 90% accuracy.  Name abstract categories given 3 members  with 70% accuracy.  Complete simple verbal analogies with 80% accuracy.  State word given 2 meanings with 80% accuracy.        Assessment / Recommendations / Plan   Plan  Continue with current plan of care      Progression Toward Goals   Progression toward goals  Progressing toward goals       SLP Education - 12/18/19 1439    Education Details  putting more content into speech    Person(s) Educated  Patient    Methods  Explanation    Comprehension  Verbalized understanding         SLP Long Term Goals - 12/11/19 1143      SLP LONG TERM GOAL #1   Title  Patient will generate grammatical, fluent, and cogent sentences to complete abstract/complex linguistic task with 80% accuracy.    Time  8    Period  Weeks    Status  New    Target Date  02/05/20      SLP LONG TERM GOAL #2   Title  Patient will complete complex memory and high level wording activities with 80% accuracy.    Time  8    Period  Weeks    Status  New    Target Date  02/05/20      SLP LONG TERM GOAL #3   Title  Patient will write grammatical and cogent phrases to complete simple/concrete linguistic task with 80% accuracy.    Time  8    Period  Weeks    Status  New    Target Date  02/05/20       Plan - 12/18/19 1440    Clinical Impression Statement  The patient demonstrates improved use of meaningful content words in structured and unstructured verbal tasks.    Speech Therapy Frequency  2x / week    Duration  Other (comment)    Treatment/Interventions  Cognitive reorganization;Compensatory strategies;Language facilitation    Potential to Achieve Goals  Good    Potential Considerations  Ability to learn/carryover information;Pain level;Family/community support;Co-morbidities;Previous level of function;Cooperation/participation level;Severity of impairments    Consulted and Agree with Plan of Care  Patient       Patient will benefit from skilled therapeutic intervention in order to improve the following  deficits and impairments:   Cognitive communication deficit    Problem List Patient Active Problem List   Diagnosis Date Noted  . Abnormal ECG 04/21/2019  . Encounter for screening colonoscopy   . Polyp of colon   . Atherosclerotic heart disease of native coronary artery without angina pectoris 01/14/2019  . Depression 01/14/2019  . GERD (gastroesophageal reflux disease) 01/14/2019  . Periodic limb movement disorder (PLMD) 01/14/2019  . Sleep apnea 01/14/2019  . Type 2 diabetes mellitus without complications ([place]) 99991111  . Stricture and stenosis of esophagus   . Problems with  swallowing and mastication   . Food impaction of esophagus   . H/O coronary artery bypass surgery 01/17/2016  . Cerebrovascular accident (CVA) due to embolism ([place]) 01/03/2016  . Hypothyroidism, unspecified 01/03/2016  . HLD (hyperlipidemia) 01/03/2016  . Stable angina ([place]) 11/29/2015  . Benign essential hypertension 05/27/2015  . Bilateral carotid artery stenosis 02/24/2015  . Atherosclerotic peripheral vascular disease ([place]) 09/07/2014  . Bladder calculus 08/12/2014  . Primary osteoarthritis of left knee 03/10/2014  . Elevated prostate specific antigen (PSA) 02/05/2013  . Snoring 01/22/2013  . Sleepiness 01/22/2013  . Dysphagia, unspecified(787.20) 01/22/2013  . Occlusion and stenosis of carotid artery without mention of cerebral infarction 01/22/2013  . Cerebral thrombosis with cerebral infarction ([place]) 01/22/2013  . Benign prostatic hyperplasia with incomplete bladder emptying 08/26/2012  . Chronic prostatitis 08/26/2012  . Family history of malignant neoplasm of prostate 08/26/2012  . Incomplete emptying of bladder 08/26/2012  . Dysphagia 05/14/2012  . Global aphasia 05/14/2012  . Stroke, acute, thrombotic ([place]) 05/12/2012  . Symptomatic carotid artery stenosis 05/12/2012  . HTN (hypertension) 05/12/2012  . COPD (chronic obstructive pulmonary disease) ([place]) 05/12/2012  . Acute respiratory  failure ([place]) 05/12/2012   Leroy Sea, MS/CCC- SLP  Lou Miner 12/18/2019, 2:42 PM  [place] MAIN [facility] SERVICES [address] [place], [place], [address] Phone: [phone]   Fax:  [phone]   Name: MOHMMAD THISSELL MRN: [id number] Date of Birth: 1946/07/27

## 2019-12-23 ENCOUNTER — Ambulatory Visit: Payer: Medicare HMO | Admitting: Speech Pathology

## 2019-12-23 ENCOUNTER — Other Ambulatory Visit: Payer: Self-pay

## 2019-12-23 DIAGNOSIS — R41841 Cognitive communication deficit: Secondary | ICD-10-CM | POA: Diagnosis not present

## 2019-12-24 ENCOUNTER — Encounter: Payer: Self-pay | Admitting: Speech Pathology

## 2019-12-24 NOTE — Therapy (Signed)
[place] MAIN [facility] SERVICES [address] [place], [place], [address] Phone: [phone]   Fax:  [phone]  Speech Language Pathology Treatment  Patient Details  Name: Paul Bass MRN: [id number] Date of Birth: February 18, 1946 Referring Provider (SLP): Dr. Manuella Ghazi   Encounter Date: 12/23/2019  End of Session - 12/24/19 0844    Visit Number  4    Number of Visits  17    Date for SLP Re-Evaluation  02/05/20    Authorization Type  Medicare    Authorization Time Period  Start 12/11/2019    Authorization - Visit Number  4    Authorization - Number of Visits  10    SLP Start Time  1000    SLP Stop Time   1050    SLP Time Calculation (min)  50 min    Activity Tolerance  Patient tolerated treatment well       Past Medical History:  Diagnosis Date  . Anxiety   . BPH (benign prostatic hypertrophy)   . CAD S/P percutaneous coronary angioplasty   . COPD (chronic obstructive pulmonary disease) ([place])   . Depression   . Diabetes mellitus without complication ([place])   . Dyspnea   . GERD (gastroesophageal reflux disease)   . HTN (hypertension)   . Hyperlipidemia   . Hypothyroidism   . Stroke ([place])   . Tobacco abuse     Past Surgical History:  Procedure Laterality Date  . CARDIAC CATHETERIZATION N/A 12/07/2015   Procedure: Left Heart Cath and Coronary Angiography;  Surgeon: Corey Skains, MD;  Location: [place] CV LAB;  Service: Cardiovascular;  Laterality: N/A;  . COLONOSCOPY WITH PROPOFOL N/A 02/18/2019   Procedure: COLONOSCOPY WITH PROPOFOL;  Surgeon: Lucilla Lame, MD;  Location: [facility] ENDOSCOPY;  Service: Endoscopy;  Laterality: N/A;  . 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 CORONARY ARTERY BYPASS GRAFT    . CYSTOSCOPY WITH INSERTION OF UROLIFT N/A 04/29/2019   Procedure: CYSTOSCOPY WITH INSERTION OF UROLIFT;  Surgeon: Abbie Sons, MD;  Location: [facility] ORS;  Service: Urology;  Laterality: N/A;   . ESOPHAGOGASTRODUODENOSCOPY (EGD) WITH PROPOFOL N/A 09/11/2016   Procedure: ESOPHAGOGASTRODUODENOSCOPY (EGD) WITH PROPOFOL;  Surgeon: Lucilla Lame, MD;  Location: [facility] ENDOSCOPY;  Service: Endoscopy;  Laterality: N/A;  . ESOPHAGOGASTRODUODENOSCOPY (EGD) WITH PROPOFOL N/A 10/17/2016   Procedure: ESOPHAGOGASTRODUODENOSCOPY (EGD) WITH PROPOFOL;  Surgeon: Lucilla Lame, MD;  Location: [facility] ENDOSCOPY;  Service: Endoscopy;  Laterality: N/A;  . ESOPHAGOGASTRODUODENOSCOPY (EGD) WITH PROPOFOL N/A 11/13/2017   Procedure: ESOPHAGOGASTRODUODENOSCOPY (EGD) WITH PROPOFOL;  Surgeon: Lucilla Lame, MD;  Location: [facility] ENDOSCOPY;  Service: Endoscopy;  Laterality: N/A;  . pins R lower leg    . rotator cuff replaced     right  . SAVORY DILATION N/A 10/17/2016   Procedure: SAVORY DILATION;  Surgeon: Lucilla Lame, MD;  Location: [facility] ENDOSCOPY;  Service: Endoscopy;  Laterality: N/A;    There were no vitals filed for this visit.  Subjective Assessment - 12/24/19 0844    Subjective  Patient eager to participate in [place] SLP TREATMENT - 12/24/19 0001      General Information   Behavior/Cognition  Alert;Cooperative;Pleasant mood    HPI   Paul Bass is 74 year old man, S/P CVA in 2013, referred for speech therapy for cognitive deficit post stroke.       Treatment Provided   Treatment provided  Cognitive-Linquistic      Pain Assessment   Pain Assessment  No/denies pain      Cognitive-Linquistic Treatment   Treatment focused on  Aphasia    Skilled Treatment  WRITING: Patient given lined paper with targeted size alphabet (upper and lower case) to model.   He returned with this completed with much improved use of lines to keep his letters large.  He was given more lined paper and phrases to copy.  VERBAL EXPRESSION:  Answer general knowledge questions with 80% accuracy for clear/cogent responses. Identify item that does not belong in list of 5 and state reason with 50% accuracy independently.   Patient appears to perseverate on previous concepts and an initial incorrect response.  REMEBERING NAMES: Reviewed 6 step method for remembering names.  Applied steps (given SLP cues) to 2 photos of people with first name.  Correctly recalled names at the end of the session given spaced retrieval practices.      Assessment / Recommendations / Plan   Plan  Continue with current plan of care      Progression Toward Goals   Progression toward goals  Progressing toward goals       SLP Education - 12/24/19 0844    Education Details  remebering names    Person(s) Educated  Patient    Methods  Explanation    Comprehension  Verbalized understanding         SLP Long Term Goals - 12/11/19 1143      SLP LONG TERM GOAL #1   Title  Patient will generate grammatical, fluent, and cogent sentences to complete abstract/complex linguistic task with 80% accuracy.    Time  8    Period  Weeks    Status  New    Target Date  02/05/20      SLP LONG TERM GOAL #2   Title  Patient will complete complex memory and high level wording activities with 80% accuracy.    Time  8    Period  Weeks    Status  New    Target Date  02/05/20      SLP LONG TERM GOAL #3   Title  Patient will write grammatical and cogent phrases to complete simple/concrete linguistic task with 80% accuracy.    Time  8    Period  Weeks    Status  New    Target Date  02/05/20       Plan - 12/24/19 0845    Clinical Impression Statement  The patient demonstrates improved use of meaningful content words in structured and unstructured verbal tasks. He demonstrates ability to use strategies to remember names.    Speech Therapy Frequency  2x / week    Duration  Other (comment)    Treatment/Interventions  Cognitive reorganization;Compensatory strategies;Language facilitation    Potential to Achieve Goals  Good    Potential Considerations  Ability to learn/carryover information;Pain level;Family/community  support;Co-morbidities;Previous level of function;Cooperation/participation level;Severity of impairments    Consulted and Agree with Plan of Care  Patient       Patient will benefit from skilled therapeutic intervention in order to improve the following deficits and impairments:   Cognitive communication deficit    Problem List Patient Active Problem List   Diagnosis Date Noted  . Abnormal ECG 04/21/2019  . Encounter for screening colonoscopy   . Polyp of colon   . Atherosclerotic heart disease of native coronary artery without angina pectoris 01/14/2019  . Depression 01/14/2019  . GERD (  gastroesophageal reflux disease) 01/14/2019  . Periodic limb movement disorder (PLMD) 01/14/2019  . Sleep apnea 01/14/2019  . Type 2 diabetes mellitus without complications ([place]) 99991111  . Stricture and stenosis of esophagus   . Problems with swallowing and mastication   . Food impaction of esophagus   . H/O coronary artery bypass surgery 01/17/2016  . Cerebrovascular accident (CVA) due to embolism ([place]) 01/03/2016  . Hypothyroidism, unspecified 01/03/2016  . HLD (hyperlipidemia) 01/03/2016  . Stable angina ([place]) 11/29/2015  . Benign essential hypertension 05/27/2015  . Bilateral carotid artery stenosis 02/24/2015  . Atherosclerotic peripheral vascular disease ([place]) 09/07/2014  . Bladder calculus 08/12/2014  . Primary osteoarthritis of left knee 03/10/2014  . Elevated prostate specific antigen (PSA) 02/05/2013  . Snoring 01/22/2013  . Sleepiness 01/22/2013  . Dysphagia, unspecified(787.20) 01/22/2013  . Occlusion and stenosis of carotid artery without mention of cerebral infarction 01/22/2013  . Cerebral thrombosis with cerebral infarction ([place]) 01/22/2013  . Benign prostatic hyperplasia with incomplete bladder emptying 08/26/2012  . Chronic prostatitis 08/26/2012  . Family history of malignant neoplasm of prostate 08/26/2012  . Incomplete emptying of bladder 08/26/2012  . Dysphagia  05/14/2012  . Global aphasia 05/14/2012  . Stroke, acute, thrombotic ([place]) 05/12/2012  . Symptomatic carotid artery stenosis 05/12/2012  . HTN (hypertension) 05/12/2012  . COPD (chronic obstructive pulmonary disease) ([place]) 05/12/2012  . Acute respiratory failure ([place]) 05/12/2012   Leroy Sea, MS/CCC- SLP  Lou Miner 12/24/2019, 8:46 AM  [place] MAIN [facility] SERVICES [address] [place], [place], [address] Phone: [phone]   Fax:  [phone]   Name: ABDISAMAD MARINE MRN: [id number] Date of Birth: 10-Dec-1945

## 2019-12-25 ENCOUNTER — Other Ambulatory Visit: Payer: Self-pay

## 2019-12-25 ENCOUNTER — Ambulatory Visit: Payer: Medicare HMO | Admitting: Speech Pathology

## 2019-12-25 DIAGNOSIS — R41841 Cognitive communication deficit: Secondary | ICD-10-CM | POA: Diagnosis not present

## 2019-12-26 ENCOUNTER — Encounter: Payer: Self-pay | Admitting: Speech Pathology

## 2019-12-26 NOTE — Therapy (Signed)
[place] [facility] SERVICES [address] [place], [place], [address] Phone: [phone]   Fax:  [phone]  Speech Language Pathology Treatment  Patient Details  Name: Paul Bass MRN: [id number] Date of Birth: 1945/11/19 Referring Provider (SLP): Dr. Manuella Ghazi   Encounter Date: 12/25/2019  End of Session - 12/26/19 1021    Visit Number  5    Number of Visits  17    Date for SLP Re-Evaluation  02/05/20    Authorization Type  Medicare    Authorization Time Period  Start 12/11/2019    Authorization - Visit Number  5    Authorization - Number of Visits  10    SLP Start Time  1000    SLP Stop Time   1500    SLP Time Calculation (min)  300 min    Activity Tolerance  Patient tolerated treatment well       Past Medical History:  Diagnosis Date  . Anxiety   . BPH (benign prostatic hypertrophy)   . CAD S/P percutaneous coronary angioplasty   . COPD (chronic obstructive pulmonary disease) ([place])   . Depression   . Diabetes mellitus without complication ([place])   . Dyspnea   . GERD (gastroesophageal reflux disease)   . HTN (hypertension)   . Hyperlipidemia   . Hypothyroidism   . Stroke ([place])   . Tobacco abuse     Past Surgical History:  Procedure Laterality Date  . CARDIAC CATHETERIZATION N/A 12/07/2015   Procedure: Left Heart Cath and Coronary Angiography;  Surgeon: Corey Skains, MD;  Location: [place] [facility] LAB;  Service: Cardiovascular;  Laterality: N/A;  . COLONOSCOPY WITH PROPOFOL N/A 02/18/2019   Procedure: COLONOSCOPY WITH PROPOFOL;  Surgeon: Lucilla Lame, MD;  Location: [facility] ENDOSCOPY;  Service: Endoscopy;  Laterality: N/A;  . 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 CORONARY ARTERY BYPASS GRAFT    . CYSTOSCOPY WITH INSERTION OF UROLIFT N/A 04/29/2019   Procedure: CYSTOSCOPY WITH INSERTION OF UROLIFT;  Surgeon: Abbie Sons, MD;  Location: [facility] ORS;  Service: Urology;  Laterality:  N/A;  . ESOPHAGOGASTRODUODENOSCOPY (EGD) WITH PROPOFOL N/A 09/11/2016   Procedure: ESOPHAGOGASTRODUODENOSCOPY (EGD) WITH PROPOFOL;  Surgeon: Lucilla Lame, MD;  Location: [facility] ENDOSCOPY;  Service: Endoscopy;  Laterality: N/A;  . ESOPHAGOGASTRODUODENOSCOPY (EGD) WITH PROPOFOL N/A 10/17/2016   Procedure: ESOPHAGOGASTRODUODENOSCOPY (EGD) WITH PROPOFOL;  Surgeon: Lucilla Lame, MD;  Location: [facility] ENDOSCOPY;  Service: Endoscopy;  Laterality: N/A;  . ESOPHAGOGASTRODUODENOSCOPY (EGD) WITH PROPOFOL N/A 11/13/2017   Procedure: ESOPHAGOGASTRODUODENOSCOPY (EGD) WITH PROPOFOL;  Surgeon: Lucilla Lame, MD;  Location: [facility] ENDOSCOPY;  Service: Endoscopy;  Laterality: N/A;  . pins R lower leg    . rotator cuff replaced     right  . SAVORY DILATION N/A 10/17/2016   Procedure: SAVORY DILATION;  Surgeon: Lucilla Lame, MD;  Location: [facility] ENDOSCOPY;  Service: Endoscopy;  Laterality: N/A;    There were no vitals filed for this visit.  Subjective Assessment - 12/26/19 1021    Subjective  Patient was willing to work hard. Brought in completed homework and asked for more.            ADULT SLP TREATMENT - 12/26/19 0001      General Information   Behavior/Cognition  Alert;Cooperative;Pleasant mood    HPI   Paul Bass is 74 year old man, S/P CVA in 2013, referred for speech therapy for cognitive deficit post stroke.  Treatment Provided   Treatment provided  Cognitive-Linquistic      Pain Assessment   Pain Assessment  No/denies pain      Cognitive-Linquistic Treatment   Treatment focused on  Aphasia    Skilled Treatment  MEMORY: Remembered 2/2 names from the previous session. Introduced 2 new names and came up with name association strategies to remember them. EXPRESSON: Completed word finding activity at 85% accuracy without any prompting. Gave coherent answers to general information questions. Completed word class association activity at 80% accuracy with mod cueing (phonemic and semantic cues).       Assessment / Recommendations / Plan   Plan  Continue with current plan of care      Progression Toward Goals   Progression toward goals  Progressing toward goals       SLP Education - 12/26/19 1021    Education Details  Emphasized importance of writing large to be legible.    Person(s) Educated  Patient    Methods  Explanation    Comprehension  Verbalized understanding         SLP Long Term Goals - 12/11/19 1143      SLP LONG TERM GOAL #1   Title  Patient will generate grammatical, fluent, and cogent sentences to complete abstract/complex linguistic task with 80% accuracy.    Time  8    Period  Weeks    Status  New    Target Date  02/05/20      SLP LONG TERM GOAL #2   Title  Patient will complete complex memory and high level wording activities with 80% accuracy.    Time  8    Period  Weeks    Status  New    Target Date  02/05/20      SLP LONG TERM GOAL #3   Title  Patient will write grammatical and cogent phrases to complete simple/concrete linguistic task with 80% accuracy.    Time  8    Period  Weeks    Status  New    Target Date  02/05/20       Plan - 12/26/19 1022    Clinical Impression Statement  Patient improved the legibility of his handwriting from previous sessions. Therapists showed him a worksheet he completed in a past session with the one he completed today and he noted that he could really see the difference. Demonstrated coherence in the information he provided to answer questions about a variety of things.    Speech Therapy Frequency  2x / week    Duration  Other (comment)    Treatment/Interventions  Cognitive reorganization;Compensatory strategies;Language facilitation    Potential to Achieve Goals  Good    Potential Considerations  Ability to learn/carryover information;Pain level;Family/community support;Co-morbidities;Previous level of function;Cooperation/participation level;Severity of impairments    Consulted and Agree with Plan of Care   Patient       Patient will benefit from skilled therapeutic intervention in order to improve the following deficits and impairments:   Cognitive communication deficit    Problem List Patient Active Problem List   Diagnosis Date Noted  . Abnormal ECG 04/21/2019  . Encounter for screening colonoscopy   . Polyp of colon   . Atherosclerotic heart disease of native coronary artery without angina pectoris 01/14/2019  . Depression 01/14/2019  . GERD (gastroesophageal reflux disease) 01/14/2019  . Periodic limb movement disorder (PLMD) 01/14/2019  . Sleep apnea 01/14/2019  . Type 2 diabetes mellitus without complications ([place]) 99991111  . Stricture  and stenosis of esophagus   . Problems with swallowing and mastication   . Food impaction of esophagus   . H/O coronary artery bypass surgery 01/17/2016  . Cerebrovascular accident (CVA) due to embolism ([place]) 01/03/2016  . Hypothyroidism, unspecified 01/03/2016  . HLD (hyperlipidemia) 01/03/2016  . Stable angina ([place]) 11/29/2015  . Benign essential hypertension 05/27/2015  . Bilateral carotid artery stenosis 02/24/2015  . Atherosclerotic peripheral vascular disease ([place]) 09/07/2014  . Bladder calculus 08/12/2014  . Primary osteoarthritis of left knee 03/10/2014  . Elevated prostate specific antigen (PSA) 02/05/2013  . Snoring 01/22/2013  . Sleepiness 01/22/2013  . Dysphagia, unspecified(787.20) 01/22/2013  . Occlusion and stenosis of carotid artery without mention of cerebral infarction 01/22/2013  . Cerebral thrombosis with cerebral infarction ([place]) 01/22/2013  . Benign prostatic hyperplasia with incomplete bladder emptying 08/26/2012  . Chronic prostatitis 08/26/2012  . Family history of malignant neoplasm of prostate 08/26/2012  . Incomplete emptying of bladder 08/26/2012  . Dysphagia 05/14/2012  . Global aphasia 05/14/2012  . Stroke, acute, thrombotic ([place]) 05/12/2012  . Symptomatic carotid artery stenosis 05/12/2012  . HTN  (hypertension) 05/12/2012  . COPD (chronic obstructive pulmonary disease) ([place]) 05/12/2012  . Acute respiratory failure ([place]) 05/12/2012   Leroy Sea, MS/CCC- SLP  Lou Miner 12/26/2019, 10:23 AM  [place] [facility]IN [facility] SERVICES [address] [place], [place], [address] Phone: [phone]   Fax:  [phone]   Name: Paul Bass MRN: [id number] Date of Birth: 1945/08/22

## 2019-12-30 ENCOUNTER — Other Ambulatory Visit: Payer: Self-pay

## 2019-12-30 ENCOUNTER — Ambulatory Visit: Payer: Medicare HMO | Attending: Neurology | Admitting: Speech Pathology

## 2019-12-30 ENCOUNTER — Encounter: Payer: Self-pay | Admitting: Speech Pathology

## 2019-12-30 DIAGNOSIS — R41841 Cognitive communication deficit: Secondary | ICD-10-CM | POA: Insufficient documentation

## 2019-12-30 NOTE — Therapy (Signed)
[facility] SERVICES [address] [place], [place], [address] Phone: [phone]   Fax:  [phone]  Speech Language Pathology Treatment  Patient Details  Name: Paul Bass MRN: [id number] Date of Birth: 09/08/1945 Referring Provider (SLP): Dr. Manuella Ghazi   Encounter Date: 12/30/2019  End of Session - 12/30/19 1015    Visit Number  6    Number of Visits  17    Date for SLP Re-Evaluation  02/05/20    Authorization Type  Medicare    Authorization Time Period  Start 12/11/2019    Authorization - Visit Number  6    Authorization - Number of Visits  10    Progress Note Due on Visit  10    SLP Start Time  1000    SLP Stop Time   1050    SLP Time Calculation (min)  50 min    Activity Tolerance  Patient tolerated treatment well       Past Medical History:  Diagnosis Date  . Anxiety   . BPH (benign prostatic hypertrophy)   . CAD S/P percutaneous coronary angioplasty   . COPD (chronic obstructive pulmonary disease) ([place])   . Depression   . Diabetes mellitus without complication ([place])   . Dyspnea   . GERD (gastroesophageal reflux disease)   . HTN (hypertension)   . Hyperlipidemia   . Hypothyroidism   . Stroke ([place])   . Tobacco abuse     Past Surgical History:  Procedure Laterality Date  . CARDIAC CATHETERIZATION N/A 12/07/2015   Procedure: Left Heart Cath and Coronary Angiography;  Surgeon: Corey Skains, MD;  Location: [place] CV LAB;  Service: Cardiovascular;  Laterality: N/A;  . COLONOSCOPY WITH PROPOFOL N/A 02/18/2019   Procedure: COLONOSCOPY WITH PROPOFOL;  Surgeon: Lucilla Lame, MD;  Location: [facility] ENDOSCOPY;  Service: Endoscopy;  Laterality: N/A;  . 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 CORONARY ARTERY BYPASS GRAFT    . CYSTOSCOPY WITH INSERTION OF UROLIFT N/A 04/29/2019   Procedure: CYSTOSCOPY WITH INSERTION OF UROLIFT;  Surgeon: Abbie Sons, MD;  Location: [facility] ORS;   Service: Urology;  Laterality: N/A;  . ESOPHAGOGASTRODUODENOSCOPY (EGD) WITH PROPOFOL N/A 09/11/2016   Procedure: ESOPHAGOGASTRODUODENOSCOPY (EGD) WITH PROPOFOL;  Surgeon: Lucilla Lame, MD;  Location: [facility] ENDOSCOPY;  Service: Endoscopy;  Laterality: N/A;  . ESOPHAGOGASTRODUODENOSCOPY (EGD) WITH PROPOFOL N/A 10/17/2016   Procedure: ESOPHAGOGASTRODUODENOSCOPY (EGD) WITH PROPOFOL;  Surgeon: Lucilla Lame, MD;  Location: [facility] ENDOSCOPY;  Service: Endoscopy;  Laterality: N/A;  . ESOPHAGOGASTRODUODENOSCOPY (EGD) WITH PROPOFOL N/A 11/13/2017   Procedure: ESOPHAGOGASTRODUODENOSCOPY (EGD) WITH PROPOFOL;  Surgeon: Lucilla Lame, MD;  Location: [facility] ENDOSCOPY;  Service: Endoscopy;  Laterality: N/A;  . pins R lower leg    . rotator cuff replaced     right  . SAVORY DILATION N/A 10/17/2016   Procedure: SAVORY DILATION;  Surgeon: Lucilla Lame, MD;  Location: [facility] ENDOSCOPY;  Service: Endoscopy;  Laterality: N/A;    There were no vitals filed for this visit.  Subjective Assessment - 12/30/19 1014    Subjective  "I can't believe how much I've lost." Patient was somewhat frustrated with his writing, but determined to improve.            ADULT SLP TREATMENT - 12/30/19 0001      General Information   Behavior/Cognition  Alert;Cooperative;Pleasant mood    HPI   Paul Bass is 74 year old man, S/P CVA in 2013,  referred for speech therapy for cognitive deficit post stroke.       Treatment Provided   Treatment provided  Cognitive-Linquistic      Pain Assessment   Pain Assessment  No/denies pain      Cognitive-Linquistic Treatment   Treatment focused on  Aphasia    Skilled Treatment  MEMORY: Matched 3/4 names with picture from previous weeks. Introduced 2 more names and came up with strategies to remember them. EXPRESSION: Identified item that doesn't belong in a category (f=5) with 90% accuracy. Was able to give a coherent explanation about why it doesn't belong 75% of the time. WRITING: Wrote out short  answers to general info questions. Phrases were legible 70% of the time with moderate prompting (spelling).      Assessment / Recommendations / Plan   Plan  Continue with current plan of care      Progression Toward Goals   Progression toward goals  Progressing toward goals       SLP Education - 12/30/19 1014    Education Details  Explanations for categories         SLP Long Term Goals - 12/11/19 1143      SLP LONG TERM GOAL #1   Title  Patient will generate grammatical, fluent, and cogent sentences to complete abstract/complex linguistic task with 80% accuracy.    Time  8    Period  Weeks    Status  New    Target Date  02/05/20      SLP LONG TERM GOAL #2   Title  Patient will complete complex memory and high level wording activities with 80% accuracy.    Time  8    Period  Weeks    Status  New    Target Date  02/05/20      SLP LONG TERM GOAL #3   Title  Patient will write grammatical and cogent phrases to complete simple/concrete linguistic task with 80% accuracy.    Time  8    Period  Weeks    Status  New    Target Date  02/05/20       Plan - 12/30/19 1016    Clinical Impression Statement  Patient is coming up with effective strategies to remember names and is able to recall them a week later. He showed proficiency in identify the wrong item in a category, but was unable to explain why for several sets. Showed understanding once therapist talked through it. He is writing longer phrases but still needs to focus on legibility and slowing down.    Speech Therapy Frequency  2x / week    Duration  Other (comment)    Treatment/Interventions  Cognitive reorganization;Compensatory strategies;Language facilitation    Potential to Achieve Goals  Good    Potential Considerations  Ability to learn/carryover information;Pain level;Family/community support;Co-morbidities;Previous level of function;Cooperation/participation level;Severity of impairments    Consulted and Agree with  Plan of Care  Patient       Patient will benefit from skilled therapeutic intervention in order to improve the following deficits and impairments:   Cognitive communication deficit    Problem List Patient Active Problem List   Diagnosis Date Noted  . Abnormal ECG 04/21/2019  . Encounter for screening colonoscopy   . Polyp of colon   . Atherosclerotic heart disease of native coronary artery without angina pectoris 01/14/2019  . Depression 01/14/2019  . GERD (gastroesophageal reflux disease) 01/14/2019  . Periodic limb movement disorder (PLMD) 01/14/2019  . Sleep apnea  01/14/2019  . Type 2 diabetes mellitus without complications ([place]) 99991111  . Stricture and stenosis of esophagus   . Problems with swallowing and mastication   . Food impaction of esophagus   . H/O coronary artery bypass surgery 01/17/2016  . Cerebrovascular accident (CVA) due to embolism ([place]) 01/03/2016  . Hypothyroidism, unspecified 01/03/2016  . HLD (hyperlipidemia) 01/03/2016  . Stable angina ([place]) 11/29/2015  . Benign essential hypertension 05/27/2015  . Bilateral carotid artery stenosis 02/24/2015  . Atherosclerotic peripheral vascular disease ([place]) 09/07/2014  . Bladder calculus 08/12/2014  . Primary osteoarthritis of left knee 03/10/2014  . Elevated prostate specific antigen (PSA) 02/05/2013  . Snoring 01/22/2013  . Sleepiness 01/22/2013  . Dysphagia, unspecified(787.20) 01/22/2013  . Occlusion and stenosis of carotid artery without mention of cerebral infarction 01/22/2013  . Cerebral thrombosis with cerebral infarction ([place]) 01/22/2013  . Benign prostatic hyperplasia with incomplete bladder emptying 08/26/2012  . Chronic prostatitis 08/26/2012  . Family history of malignant neoplasm of prostate 08/26/2012  . Incomplete emptying of bladder 08/26/2012  . Dysphagia 05/14/2012  . Global aphasia 05/14/2012  . Stroke, acute, thrombotic ([place]) 05/12/2012  . Symptomatic carotid artery stenosis  05/12/2012  . HTN (hypertension) 05/12/2012  . COPD (chronic obstructive pulmonary disease) ([place]) 05/12/2012  . Acute respiratory failure ([place]) 05/12/2012    Maylon Cos, Student Intern 12/30/2019, 10:18 AM  [place] [facility] SERVICES [address] [place], [place], [address] Phone: [phone]   Fax:  [phone]   Name: KRAIG BONDI MRN: [id number] Date of Birth: 1946-01-01

## 2020-01-06 ENCOUNTER — Ambulatory Visit: Payer: Medicare HMO | Admitting: Speech Pathology

## 2020-01-06 ENCOUNTER — Other Ambulatory Visit: Payer: Self-pay

## 2020-01-06 ENCOUNTER — Encounter: Payer: Self-pay | Admitting: Speech Pathology

## 2020-01-06 DIAGNOSIS — R41841 Cognitive communication deficit: Secondary | ICD-10-CM | POA: Diagnosis not present

## 2020-01-06 NOTE — Therapy (Signed)
[place] MAIN [facility] SERVICES [address] [place], [place], [address] Phone: [phone]   Fax:  [phone]  Speech Language Pathology Treatment  Patient Details  Name: Paul Bass MRN: [id number] Date of Birth: March 01, 1946 Referring Provider (SLP): Dr. Manuella Ghazi   Encounter Date: 01/06/2020  End of Session - 01/06/20 1433    Visit Number  7    Number of Visits  17    Date for SLP Re-Evaluation  02/05/20    Authorization Type  Medicare    Authorization Time Period  Start 12/11/2019    Authorization - Visit Number  7    Authorization - Number of Visits  10    Progress Note Due on Visit  10    SLP Start Time  0900    SLP Stop Time   0950    SLP Time Calculation (min)  50 min    Activity Tolerance  Patient tolerated treatment well       Past Medical History:  Diagnosis Date  . Anxiety   . BPH (benign prostatic hypertrophy)   . CAD S/P percutaneous coronary angioplasty   . COPD (chronic obstructive pulmonary disease) ([place])   . Depression   . Diabetes mellitus without complication ([place])   . Dyspnea   . GERD (gastroesophageal reflux disease)   . HTN (hypertension)   . Hyperlipidemia   . Hypothyroidism   . Stroke ([place])   . Tobacco abuse     Past Surgical History:  Procedure Laterality Date  . CARDIAC CATHETERIZATION N/A 12/07/2015   Procedure: Left Heart Cath and Coronary Angiography;  Surgeon: Corey Skains, MD;  Location: [place] CV LAB;  Service: Cardiovascular;  Laterality: N/A;  . COLONOSCOPY WITH PROPOFOL N/A 02/18/2019   Procedure: COLONOSCOPY WITH PROPOFOL;  Surgeon: Lucilla Lame, MD;  Location: [facility] ENDOSCOPY;  Service: Endoscopy;  Laterality: N/A;  . 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 CORONARY ARTERY BYPASS GRAFT    . CYSTOSCOPY WITH INSERTION OF UROLIFT N/A 04/29/2019   Procedure: CYSTOSCOPY WITH INSERTION OF UROLIFT;  Surgeon: Abbie Sons, MD;  Location: [facility] ORS;   Service: Urology;  Laterality: N/A;  . ESOPHAGOGASTRODUODENOSCOPY (EGD) WITH PROPOFOL N/A 09/11/2016   Procedure: ESOPHAGOGASTRODUODENOSCOPY (EGD) WITH PROPOFOL;  Surgeon: Lucilla Lame, MD;  Location: [facility] ENDOSCOPY;  Service: Endoscopy;  Laterality: N/A;  . ESOPHAGOGASTRODUODENOSCOPY (EGD) WITH PROPOFOL N/A 10/17/2016   Procedure: ESOPHAGOGASTRODUODENOSCOPY (EGD) WITH PROPOFOL;  Surgeon: Lucilla Lame, MD;  Location: [facility] ENDOSCOPY;  Service: Endoscopy;  Laterality: N/A;  . ESOPHAGOGASTRODUODENOSCOPY (EGD) WITH PROPOFOL N/A 11/13/2017   Procedure: ESOPHAGOGASTRODUODENOSCOPY (EGD) WITH PROPOFOL;  Surgeon: Lucilla Lame, MD;  Location: [facility] ENDOSCOPY;  Service: Endoscopy;  Laterality: N/A;  . pins R lower leg    . rotator cuff replaced     right  . SAVORY DILATION N/A 10/17/2016   Procedure: SAVORY DILATION;  Surgeon: Lucilla Lame, MD;  Location: [facility] ENDOSCOPY;  Service: Endoscopy;  Laterality: N/A;    There were no vitals filed for this visit.  Subjective Assessment - 01/06/20 1432    Subjective  Frustrated when he can't remember names, but is determined to succeed.            ADULT SLP TREATMENT - 01/06/20 0001      General Information   Behavior/Cognition  Alert;Cooperative;Pleasant mood    HPI   Paul Bass is 74 year old man, S/P CVA in 2013, referred for speech therapy for cognitive deficit  post stroke.       Treatment Provided   Treatment provided  Cognitive-Linquistic      Pain Assessment   Pain Assessment  No/denies pain      Cognitive-Linquistic Treatment   Treatment focused on  Aphasia    Skilled Treatment  MEMORY: Patient remembered names of people given visual cue in 30% of opportunities independently. Improved to 80% given moderate cueing (phonemic). Quizzed patient again at the end of session. Remembered 75% independently. EXPRESSION: Read description phrases and completed word finding task at 85% accuracy. VISUAL REASONING: Solved visual analogies at 85% accuracy.       Assessment / Recommendations / Plan   Plan  Continue with current plan of care      Progression Toward Goals   Progression toward goals  Progressing toward goals       SLP Education - 01/06/20 1432    Education Details  Large writing, making connections to names         SLP Long Term Goals - 12/11/19 1143      SLP LONG TERM GOAL #1   Title  Patient will generate grammatical, fluent, and cogent sentences to complete abstract/complex linguistic task with 80% accuracy.    Time  8    Period  Weeks    Status  New    Target Date  02/05/20      SLP LONG TERM GOAL #2   Title  Patient will complete complex memory and high level wording activities with 80% accuracy.    Time  8    Period  Weeks    Status  New    Target Date  02/05/20      SLP LONG TERM GOAL #3   Title  Patient will write grammatical and cogent phrases to complete simple/concrete linguistic task with 80% accuracy.    Time  8    Period  Weeks    Status  New    Target Date  02/05/20       Plan - 01/06/20 1433    Clinical Impression Statement  Patient is improving legibility of handwriting (writing larger and more clearly). States that he has been practicing writing at home often. Still requires moderate cueing to spell longer words correctly. Patient is coming up with good strategies to remember names. Able to accurately retrieve names at the end of the session after completing different tasks in between.    Speech Therapy Frequency  2x / week    Duration  Other (comment)    Treatment/Interventions  Cognitive reorganization;Compensatory strategies;Language facilitation    Potential to Achieve Goals  Good    Potential Considerations  Ability to learn/carryover information;Pain level;Family/community support;Co-morbidities;Previous level of function;Cooperation/participation level;Severity of impairments    Consulted and Agree with Plan of Care  Patient       Patient will benefit from skilled therapeutic  intervention in order to improve the following deficits and impairments:   Cognitive communication deficit    Problem List Patient Active Problem List   Diagnosis Date Noted  . Abnormal ECG 04/21/2019  . Encounter for screening colonoscopy   . Polyp of colon   . Atherosclerotic heart disease of native coronary artery without angina pectoris 01/14/2019  . Depression 01/14/2019  . GERD (gastroesophageal reflux disease) 01/14/2019  . Periodic limb movement disorder (PLMD) 01/14/2019  . Sleep apnea 01/14/2019  . Type 2 diabetes mellitus without complications ([place]) 20/25/4270  . Stricture and stenosis of esophagus   . Problems with swallowing and mastication   .  Food impaction of esophagus   . H/O coronary artery bypass surgery 01/17/2016  . Cerebrovascular accident (CVA) due to embolism ([place]) 01/03/2016  . Hypothyroidism, unspecified 01/03/2016  . HLD (hyperlipidemia) 01/03/2016  . Stable angina ([place]) 11/29/2015  . Benign essential hypertension 05/27/2015  . Bilateral carotid artery stenosis 02/24/2015  . Atherosclerotic peripheral vascular disease ([place]) 09/07/2014  . Bladder calculus 08/12/2014  . Primary osteoarthritis of left knee 03/10/2014  . Elevated prostate specific antigen (PSA) 02/05/2013  . Snoring 01/22/2013  . Sleepiness 01/22/2013  . Dysphagia, unspecified(787.20) 01/22/2013  . Occlusion and stenosis of carotid artery without mention of cerebral infarction 01/22/2013  . Cerebral thrombosis with cerebral infarction ([place]) 01/22/2013  . Benign prostatic hyperplasia with incomplete bladder emptying 08/26/2012  . Chronic prostatitis 08/26/2012  . Family history of malignant neoplasm of prostate 08/26/2012  . Incomplete emptying of bladder 08/26/2012  . Dysphagia 05/14/2012  . Global aphasia 05/14/2012  . Stroke, acute, thrombotic ([place]) 05/12/2012  . Symptomatic carotid artery stenosis 05/12/2012  . HTN (hypertension) 05/12/2012  . COPD (chronic obstructive pulmonary  disease) ([place]) 05/12/2012  . Acute respiratory failure ([place]) 05/12/2012    Maylon Cos, Student Intern 01/06/2020, 2:34 PM  [place] MAIN [facility] SERVICES [address] [place], [place], [address] Phone: [phone]   Fax:  [phone]   Name: Paul Bass MRN: [id number] Date of Birth: [birth year]

## 2020-01-08 ENCOUNTER — Encounter: Payer: Medicare HMO | Admitting: Speech Pathology

## 2020-01-13 ENCOUNTER — Ambulatory Visit: Payer: Medicare HMO | Admitting: Speech Pathology

## 2020-01-13 ENCOUNTER — Encounter: Payer: Self-pay | Admitting: Speech Pathology

## 2020-01-13 ENCOUNTER — Other Ambulatory Visit: Payer: Self-pay

## 2020-01-13 DIAGNOSIS — R41841 Cognitive communication deficit: Secondary | ICD-10-CM

## 2020-01-13 NOTE — Therapy (Signed)
[place] MAIN [facility] SERVICES [address] [place], [place], [address] Phone: [phone]   Fax:  [phone]  Speech Language Pathology Treatment  Patient Details  Name: Paul Bass MRN: [id number] Date of Birth: Nov 29, 1945 Referring Provider (SLP): Dr. Manuella Ghazi   Encounter Date: 01/13/2020   End of Session - 01/13/20 1721    Visit Number 8    Number of Visits 17    Date for SLP Re-Evaluation 02/05/20    Authorization Type Medicare    Authorization Time Period Start 12/11/2019    Authorization - Visit Number 8    Authorization - Number of Visits 10    Progress Note Due on Visit 10    SLP Start Time 0932    SLP Stop Time  0945    SLP Time Calculation (min) 50 min    Activity Tolerance Patient tolerated treatment well           Past Medical History:  Diagnosis Date   Anxiety    BPH (benign prostatic hypertrophy)    CAD S/P percutaneous coronary angioplasty    COPD (chronic obstructive pulmonary disease) ([place])    Depression    Diabetes mellitus without complication ([place])    Dyspnea    GERD (gastroesophageal reflux disease)    HTN (hypertension)    Hyperlipidemia    Hypothyroidism    Stroke ([place])    Tobacco abuse     Past Surgical History:  Procedure Laterality Date   CARDIAC CATHETERIZATION N/A 12/07/2015   Procedure: Left Heart Cath and Coronary Angiography;  Surgeon: Corey Skains, MD;  Location: [place] CV LAB;  Service: Cardiovascular;  Laterality: N/A;   COLONOSCOPY WITH PROPOFOL N/A 02/18/2019   Procedure: COLONOSCOPY WITH PROPOFOL;  Surgeon: Lucilla Lame, MD;  Location: [facility] ENDOSCOPY;  Service: Endoscopy;  Laterality: N/A;   CORONARY ANGIOPLASTY WITH STENT PLACEMENT     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   CORONARY ARTERY BYPASS GRAFT     CYSTOSCOPY WITH INSERTION OF UROLIFT N/A 04/29/2019   Procedure: CYSTOSCOPY WITH INSERTION OF UROLIFT;  Surgeon: Abbie Sons, MD;  Location: [facility] ORS;   Service: Urology;  Laterality: N/A;   ESOPHAGOGASTRODUODENOSCOPY (EGD) WITH PROPOFOL N/A 09/11/2016   Procedure: ESOPHAGOGASTRODUODENOSCOPY (EGD) WITH PROPOFOL;  Surgeon: Lucilla Lame, MD;  Location: [facility] ENDOSCOPY;  Service: Endoscopy;  Laterality: N/A;   ESOPHAGOGASTRODUODENOSCOPY (EGD) WITH PROPOFOL N/A 10/17/2016   Procedure: ESOPHAGOGASTRODUODENOSCOPY (EGD) WITH PROPOFOL;  Surgeon: Lucilla Lame, MD;  Location: [facility] ENDOSCOPY;  Service: Endoscopy;  Laterality: N/A;   ESOPHAGOGASTRODUODENOSCOPY (EGD) WITH PROPOFOL N/A 11/13/2017   Procedure: ESOPHAGOGASTRODUODENOSCOPY (EGD) WITH PROPOFOL;  Surgeon: Lucilla Lame, MD;  Location: [facility] ENDOSCOPY;  Service: Endoscopy;  Laterality: N/A;   pins R lower leg     rotator cuff replaced     right   SAVORY DILATION N/A 10/17/2016   Procedure: SAVORY DILATION;  Surgeon: Lucilla Lame, MD;  Location: [facility] ENDOSCOPY;  Service: Endoscopy;  Laterality: N/A;    There were no vitals filed for this visit.   Subjective Assessment - 01/13/20 1720    Subjective Attentive, frustrated when he couldnt remember names                 ADULT SLP TREATMENT - 01/13/20 0001      General Information   Behavior/Cognition Alert;Cooperative;Pleasant mood    HPI  Paul Bass is 74 year old man, S/P CVA in 2013, referred for speech therapy for cognitive deficit post stroke.  Treatment Provided   Treatment provided Cognitive-Linquistic      Pain Assessment   Pain Assessment No/denies pain      Cognitive-Linquistic Treatment   Treatment focused on Aphasia    Skilled Treatment MEMORY: Remembered 50% of names independently given picture stimuli. Increased to 70% when quizzed again at the end of session. Introduced 2 new names and came up with memory strategies. EXPRESSION: Identified item that does not fit into list (f=5) at 95% accuracy. Had more difficulty naming category. WRITING: Copied sentences at 75% accuracy.      Assessment / Recommendations / Plan    Plan Continue with current plan of care      Progression Toward Goals   Progression toward goals Progressing toward goals            SLP Education - 01/13/20 1721    Education Details Slow down when writing for legibility    Person(s) Educated Patient    Methods Explanation    Comprehension Verbalized understanding              SLP Long Term Goals - 12/11/19 1143      SLP LONG TERM GOAL #1   Title Patient will generate grammatical, fluent, and cogent sentences to complete abstract/complex linguistic task with 80% accuracy.    Time 8    Period Weeks    Status New    Target Date 02/05/20      SLP LONG TERM GOAL #2   Title Patient will complete complex memory and high level wording activities with 80% accuracy.    Time 8    Period Weeks    Status New    Target Date 02/05/20      SLP LONG TERM GOAL #3   Title Patient will write grammatical and cogent phrases to complete simple/concrete linguistic task with 80% accuracy.    Time 8    Period Weeks    Status New    Target Date 02/05/20            Plan - 01/13/20 1721    Clinical Impression Statement Patient had difficulty remembering names given picture stimuli after a week off from therapy. Performed much better the second time through. Continues to use cueing strategies he came up with to remember names. Will continue to work on word finding and naming categories. He knows which items belong to a category, but has more difficulty naming it.    Speech Therapy Frequency 2x / week    Duration Other (comment)    Treatment/Interventions Cognitive reorganization;Compensatory strategies;Language facilitation    Potential to Achieve Goals Good    Potential Considerations Ability to learn/carryover information;Pain level;Family/community support;Co-morbidities;Previous level of function;Cooperation/participation level;Severity of impairments           Patient will benefit from skilled therapeutic intervention in order  to improve the following deficits and impairments:   Cognitive communication deficit    Problem List Patient Active Problem List   Diagnosis Date Noted   Abnormal ECG 04/21/2019   Encounter for screening colonoscopy    Polyp of colon    Atherosclerotic heart disease of native coronary artery without angina pectoris 01/14/2019   Depression 01/14/2019   GERD (gastroesophageal reflux disease) 01/14/2019   Periodic limb movement disorder (PLMD) 01/14/2019   Sleep apnea 01/14/2019   Type 2 diabetes mellitus without complications ([place]) 37/04/6268   Stricture and stenosis of esophagus    Problems with swallowing and mastication    Food impaction of esophagus  H/O coronary artery bypass surgery 01/17/2016   Cerebrovascular accident (CVA) due to embolism ([place]) 01/03/2016   Hypothyroidism, unspecified 01/03/2016   HLD (hyperlipidemia) 01/03/2016   Stable angina ([place]) 11/29/2015   Benign essential hypertension 05/27/2015   Bilateral carotid artery stenosis 02/24/2015   Atherosclerotic peripheral vascular disease ([place]) 09/07/2014   Bladder calculus 08/12/2014   Primary osteoarthritis of left knee 03/10/2014   Elevated prostate specific antigen (PSA) 02/05/2013   Snoring 01/22/2013   Sleepiness 01/22/2013   Dysphagia, unspecified(787.20) 01/22/2013   Occlusion and stenosis of carotid artery without mention of cerebral infarction 01/22/2013   Cerebral thrombosis with cerebral infarction ([place]) 01/22/2013   Benign prostatic hyperplasia with incomplete bladder emptying 08/26/2012   Chronic prostatitis 08/26/2012   Family history of malignant neoplasm of prostate 08/26/2012   Incomplete emptying of bladder 08/26/2012   Dysphagia 05/14/2012   Global aphasia 05/14/2012   Stroke, acute, thrombotic ([place]) 05/12/2012   Symptomatic carotid artery stenosis 05/12/2012   HTN (hypertension) 05/12/2012   COPD (chronic obstructive pulmonary disease) ([place])  05/12/2012   Acute respiratory failure ([place]) 05/12/2012    Maylon Cos, Student Intern 01/13/2020, 5:22 PM  [facility] [address] [place], [place], [address] Phone: [phone]   Fax:  [phone]   Name: Paul Bass MRN: [id number] Date of Birth: 10-Nov-1945

## 2020-01-14 ENCOUNTER — Other Ambulatory Visit: Payer: Self-pay

## 2020-01-14 ENCOUNTER — Encounter: Payer: Self-pay | Admitting: Urology

## 2020-01-14 ENCOUNTER — Ambulatory Visit: Payer: Medicare HMO | Admitting: Urology

## 2020-01-14 DIAGNOSIS — N401 Enlarged prostate with lower urinary tract symptoms: Secondary | ICD-10-CM | POA: Diagnosis not present

## 2020-01-14 DIAGNOSIS — R338 Other retention of urine: Secondary | ICD-10-CM

## 2020-01-14 LAB — BLADDER SCAN AMB NON-IMAGING: Scan Result: 27

## 2020-01-14 NOTE — Progress Notes (Signed)
12/30/19 12:56 PM   Paul Bass [birth year] [id number]  Referring provider: [name], NP [address] [place],  [place] [address] Chief Complaint  Patient presents with  . Benign Prostatic Hypertrophy    HPI: Paul Bass is a 74 y.o. male who presents today for a an annual follow up.  - UroLift and cystoscopic removal of a small bladder calculus on 04/29/2019 - Foley catheter removed 05/08/2019 but urinary hesitancy, intermittent urinary stream, frequency and urgency persisted -IPSS 05/30/2019 was 33/35 -IPSS preop 21/35 -Stable urinary symtoms today, most bothersome symptoms are frequency, urgency, nocturia x4 -Denies dysuria, gross hematuria -IPSS today was 16 -PVR on bladder scan was 27 mL -Remains on tamsulosin daily -Urine cultures October 2020 and January 2021 grew Pseudomonas   PMH: Past Medical History:  Diagnosis Date  . Anxiety   . BPH (benign prostatic hypertrophy)   . CAD S/P percutaneous coronary angioplasty   . COPD (chronic obstructive pulmonary disease) ([place])   . Depression   . Diabetes mellitus without complication ([place])   . Dyspnea   . GERD (gastroesophageal reflux disease)   . HTN (hypertension)   . Hyperlipidemia   . Hypothyroidism   . Stroke ([place])   . Tobacco abuse     Surgical History: Past Surgical History:  Procedure Laterality Date  . CARDIAC CATHETERIZATION N/A 12/07/2015   Procedure: Left Heart Cath and Coronary Angiography;  Surgeon: Corey Skains, MD;  Location: [place] [facility] LAB;  Service: Cardiovascular;  Laterality: N/A;  . COLONOSCOPY WITH PROPOFOL N/A 02/18/2019   Procedure: COLONOSCOPY WITH PROPOFOL;  Surgeon: Lucilla Lame, MD;  Location: [facility] ENDOSCOPY;  Service: Endoscopy;  Laterality: N/A;  . 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 CORONARY ARTERY BYPASS GRAFT    . CYSTOSCOPY WITH INSERTION OF UROLIFT N/A 04/29/2019   Procedure: CYSTOSCOPY WITH INSERTION OF UROLIFT;   Surgeon: Abbie Sons, MD;  Location: [facility] ORS;  Service: Urology;  Laterality: N/A;  . ESOPHAGOGASTRODUODENOSCOPY (EGD) WITH PROPOFOL N/A 09/11/2016   Procedure: ESOPHAGOGASTRODUODENOSCOPY (EGD) WITH PROPOFOL;  Surgeon: Lucilla Lame, MD;  Location: [facility] ENDOSCOPY;  Service: Endoscopy;  Laterality: N/A;  . ESOPHAGOGASTRODUODENOSCOPY (EGD) WITH PROPOFOL N/A 10/17/2016   Procedure: ESOPHAGOGASTRODUODENOSCOPY (EGD) WITH PROPOFOL;  Surgeon: Lucilla Lame, MD;  Location: [facility] ENDOSCOPY;  Service: Endoscopy;  Laterality: N/A;  . ESOPHAGOGASTRODUODENOSCOPY (EGD) WITH PROPOFOL N/A 11/13/2017   Procedure: ESOPHAGOGASTRODUODENOSCOPY (EGD) WITH PROPOFOL;  Surgeon: Lucilla Lame, MD;  Location: [facility] ENDOSCOPY;  Service: Endoscopy;  Laterality: N/A;  . pins R lower leg    . rotator cuff replaced     right  . SAVORY DILATION N/A 10/17/2016   Procedure: SAVORY DILATION;  Surgeon: Lucilla Lame, MD;  Location: [facility] ENDOSCOPY;  Service: Endoscopy;  Laterality: N/A;    Home Medications:  Allergies as of 01/14/2020   No Known Allergies     Medication List       Accurate as of January 14, 2020 12:56 PM. If you have any questions, ask your nurse or doctor.        albuterol 108 (90 Base) MCG/ACT inhaler Commonly known as: VENTOLIN HFA Inhale 2 puffs into the lungs every 6 (six) hours as needed for wheezing or shortness of breath. For shortness of breath   atorvastatin 80 MG tablet Commonly known as: LIPITOR Take 80 mg by mouth daily.   budesonide-formoterol 160-4.5 MCG/ACT inhaler Commonly known as: SYMBICORT Inhale 2 puffs into the lungs daily.  buPROPion 300 MG 24 hr tablet Commonly known as: WELLBUTRIN XL Take 300 mg by mouth at bedtime.   cholecalciferol 25 MCG (1000 UNIT) tablet Commonly known as: VITAMIN D3 Take 1,000 Units by mouth every evening.   Cinnamon Bark Powd Take 1,000 mg by mouth daily.   citalopram 20 MG tablet Commonly known as: CELEXA Take 20 mg by mouth daily.   clonazePAM  0.5 MG tablet Commonly known as: KLONOPIN Take 0.5 mg by mouth at bedtime as needed (restless leg syndrome).   clopidogrel 75 MG tablet Commonly known as: PLAVIX Take 75 mg by mouth daily.   CoQ10 100 MG Caps Take 100 mg by mouth at bedtime.   Fish Oil 1200 MG Caps Take 1,000 mg by mouth 2 (two) times daily.   glucose blood test strip   levothyroxine 100 MCG tablet Commonly known as: SYNTHROID Take 100 mcg by mouth daily before breakfast.   memantine 5 MG tablet Commonly known as: NAMENDA Take 1 tablet by mouth daily.   metFORMIN 500 MG tablet Commonly known as: GLUCOPHAGE Take 500 mg by mouth 2 (two) times daily.   multivitamin with minerals Tabs tablet Take 0.5 tablets by mouth 2 (two) times daily.   naproxen sodium 220 MG tablet Commonly known as: ALEVE Take 220-440 mg by mouth 2 (two) times daily as needed (pain.).   pantoprazole 40 MG tablet Commonly known as: PROTONIX Take 1 tablet (40 mg total) by mouth at bedtime.   psyllium 28 % packet Commonly known as: METAMUCIL SMOOTH TEXTURE Take 1 packet by mouth daily.   RA Krill Oil 500 MG Caps Take 500 mg by mouth at bedtime.   tamsulosin 0.4 MG Caps capsule Commonly known as: FLOMAX TAKE 1 CAPSULE BY MOUTH EVERY DAY   telmisartan 40 MG tablet Commonly known as: MICARDIS Take 1 tablet by mouth 1 day or 1 dose.   Uribel 118 MG Caps Take 1 capsule (118 mg total) by mouth 3 (three) times daily as needed (Urinary frequency, urgency, burning).       Allergies: No Known Allergies  Family History: Family History  Problem Relation Age of Onset  . Heart failure Father   . Throat cancer Mother   . Diabetes Brother     Social History:  reports that he quit smoking about 7 years ago. His smoking use included cigarettes. He smoked 2.00 packs per day. He has never used smokeless tobacco. He reports previous alcohol use. He reports that he does not use drugs.   Physical Exam: BP (!) 143/68   Pulse 64   Ht  6' (1.829 m)   Wt 230 lb (104.3 kg)   BMI 31.19 kg/m   Constitutional:  Alert and oriented, No acute distress. HEENT: [place] AT, moist mucus membranes.  Trachea midline, no masses. Cardiovascular: No clubbing, cyanosis, or edema. Respiratory: Normal respiratory effort, no increased work of breathing. Skin: No rashes, bruises or suspicious lesions. Neurologic: Grossly intact, no focal deficits, moving all 4 extremities. Psychiatric: Normal mood and affect.     Assessment & Plan:    1. BPH with LUTS -Still with storage related voiding symptoms though over all improvement in IPSS with marked improvement over his immediate postoperative scores -discussed other surgical options including TURP and PVP. He desires to continue tamsulosin for now. -Schedule annual follow up for PVR and return earlier for any worsening symptoms -Urinalysis results after he left show persistent pyuria at >30 WBC and urine was nitrite positive -Urine culture was ordered  [facility] [address], [place], [place] [address] [phone]  I, Joneen Boers Peace, am acting as a Education administrator for Dr. Nicki Reaper C. [place] Hoback.  I have reviewed the above documentation for accuracy and completeness, and I agree with the above.   Abbie Sons, MD

## 2020-01-15 ENCOUNTER — Ambulatory Visit: Payer: Medicare HMO | Admitting: Speech Pathology

## 2020-01-15 ENCOUNTER — Encounter: Payer: Self-pay | Admitting: Speech Pathology

## 2020-01-15 DIAGNOSIS — R41841 Cognitive communication deficit: Secondary | ICD-10-CM | POA: Diagnosis not present

## 2020-01-15 NOTE — Therapy (Signed)
[place] MAIN [facility] SERVICES [address] [place], [place], [address] Phone: [phone]   Fax:  [phone]  Speech Language Pathology Treatment  Patient Details  Name: Paul Bass MRN: [id number] Date of Birth: [birth year] Referring Provider (SLP): Dr. Manuella Ghazi   Encounter Date: 01/15/2020   End of Session - 01/15/20 1007    Visit Number 9    Number of Visits 17    Date for SLP Re-Evaluation 02/05/20    Authorization Type Medicare    Authorization Time Period Start 12/11/2019    Authorization - Visit Number 9    Authorization - Number of Visits 10    Progress Note Due on Visit 10    SLP Start Time 0900    SLP Stop Time  0945    SLP Time Calculation (min) 45 min    Activity Tolerance Patient tolerated treatment well           Past Medical History:  Diagnosis Date  . Anxiety   . BPH (benign prostatic hypertrophy)   . CAD S/P percutaneous coronary angioplasty   . COPD (chronic obstructive pulmonary disease) ([place])   . Depression   . Diabetes mellitus without complication ([place])   . Dyspnea   . GERD (gastroesophageal reflux disease)   . HTN (hypertension)   . Hyperlipidemia   . Hypothyroidism   . Stroke ([place])   . Tobacco abuse     Past Surgical History:  Procedure Laterality Date  . CARDIAC CATHETERIZATION N/A 12/07/2015   Procedure: Left Heart Cath and Coronary Angiography;  Surgeon: Corey Skains, MD;  Location: [facility];  Service: Cardiovascular;  Laterality: N/A;  . COLONOSCOPY WITH PROPOFOL N/A 02/18/2019   Procedure: COLONOSCOPY WITH PROPOFOL;  Surgeon: Lucilla Lame, MD;  Location: [facility] ENDOSCOPY;  Service: Endoscopy;  Laterality: N/A;  . 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 CORONARY ARTERY BYPASS GRAFT    . CYSTOSCOPY WITH INSERTION OF UROLIFT N/A 04/29/2019   Procedure: CYSTOSCOPY WITH INSERTION OF UROLIFT;  Surgeon: Abbie Sons, MD;  Location: [facility] ORS;   Service: Urology;  Laterality: N/A;  . ESOPHAGOGASTRODUODENOSCOPY (EGD) WITH PROPOFOL N/A 09/11/2016   Procedure: ESOPHAGOGASTRODUODENOSCOPY (EGD) WITH PROPOFOL;  Surgeon: Lucilla Lame, MD;  Location: [facility] ENDOSCOPY;  Service: Endoscopy;  Laterality: N/A;  . ESOPHAGOGASTRODUODENOSCOPY (EGD) WITH PROPOFOL N/A 10/17/2016   Procedure: ESOPHAGOGASTRODUODENOSCOPY (EGD) WITH PROPOFOL;  Surgeon: Lucilla Lame, MD;  Location: [facility] ENDOSCOPY;  Service: Endoscopy;  Laterality: N/A;  . ESOPHAGOGASTRODUODENOSCOPY (EGD) WITH PROPOFOL N/A 11/13/2017   Procedure: ESOPHAGOGASTRODUODENOSCOPY (EGD) WITH PROPOFOL;  Surgeon: Lucilla Lame, MD;  Location: [facility] ENDOSCOPY;  Service: Endoscopy;  Laterality: N/A;  . pins R lower leg    . rotator cuff replaced     right  . SAVORY DILATION N/A 10/17/2016   Procedure: SAVORY DILATION;  Surgeon: Lucilla Lame, MD;  Location: [facility] ENDOSCOPY;  Service: Endoscopy;  Laterality: N/A;    There were no vitals filed for this visit.   Subjective Assessment - 01/15/20 1007    Subjective Frustrated with spelling. "That was hard work."                 ADULT SLP TREATMENT - 01/15/20 0001      General Information   Behavior/Cognition Alert;Cooperative;Pleasant mood    HPI  Paul Bass is 74 year old man, S/P CVA in 2013, referred for speech therapy for cognitive deficit post stroke.  Treatment Provided   Treatment provided Cognitive-Linquistic      Pain Assessment   Pain Assessment No/denies pain      Cognitive-Linquistic Treatment   Treatment focused on Aphasia    Skilled Treatment MEMORY: Remembered names of people given visual stimulus at 70% accuracy independently. Improved to 90% when quizzed again at the end of session. EXPRESSION: Named categories given set of 3 words at 65% accuracy with mid to moderate cueing (phonemic, semantic). Had difficulty with spelling. Patient used word retrieval strategies (gestures, synonyms, categories) to coherently describe a  specific word at 50% accuracy.      Assessment / Recommendations / Plan   Plan Continue with current plan of care      Progression Toward Goals   Progression toward goals Progressing toward goals            SLP Education - 01/15/20 1007    Education Details Word retrieval strategies    Person(s) Educated Patient    Methods Explanation    Comprehension Verbalized understanding;Returned demonstration              SLP Long Term Goals - 12/11/19 1143      SLP LONG TERM GOAL #1   Title Patient will generate grammatical, fluent, and cogent sentences to complete abstract/complex linguistic task with 80% accuracy.    Time 8    Period Weeks    Status New    Target Date 02/05/20      SLP LONG TERM GOAL #2   Title Patient will complete complex memory and high level wording activities with 80% accuracy.    Time 8    Period Weeks    Status New    Target Date 02/05/20      SLP LONG TERM GOAL #3   Title Patient will write grammatical and cogent phrases to complete simple/concrete linguistic task with 80% accuracy.    Time 8    Period Weeks    Status New    Target Date 02/05/20            Plan - 01/15/20 1008    Clinical Impression Statement Patient is showing improvement with memory related to names of people. Continues to use cueing strategies effectively. Expressed frustration with thinking of a specific category name. Will continue to work on word retrieval strategies. Writing legibility is improving, but patient has difficulty spelling if words are not being copied.    Speech Therapy Frequency 2x / week    Duration Other (comment)    Treatment/Interventions Cognitive reorganization;Compensatory strategies;Language facilitation    Potential to Achieve Goals Good    Potential Considerations Ability to learn/carryover information;Pain level;Family/community support;Co-morbidities;Previous level of function;Cooperation/participation level;Severity of impairments            Patient will benefit from skilled therapeutic intervention in order to improve the following deficits and impairments:   Cognitive communication deficit    Problem List Patient Active Problem List   Diagnosis Date Noted  . Abnormal ECG 04/21/2019  . Encounter for screening colonoscopy   . Polyp of colon   . Atherosclerotic heart disease of native coronary artery without angina pectoris 01/14/2019  . Depression 01/14/2019  . GERD (gastroesophageal reflux disease) 01/14/2019  . Periodic limb movement disorder (PLMD) 01/14/2019  . Sleep apnea 01/14/2019  . Type 2 diabetes mellitus without complications ([place]) 29/93/7169  . Stricture and stenosis of esophagus   . Problems with swallowing and mastication   . Food impaction of esophagus   . H/O coronary  artery bypass surgery 01/17/2016  . Cerebrovascular accident (CVA) due to embolism ([place]) 01/03/2016  . Hypothyroidism, unspecified 01/03/2016  . HLD (hyperlipidemia) 01/03/2016  . Stable angina ([place]) 11/29/2015  . Benign essential hypertension 05/27/2015  . Bilateral carotid artery stenosis 02/24/2015  . Atherosclerotic peripheral vascular disease ([place]) 09/07/2014  . Bladder calculus 08/12/2014  . Primary osteoarthritis of left knee 03/10/2014  . Elevated prostate specific antigen (PSA) 02/05/2013  . Snoring 01/22/2013  . Sleepiness 01/22/2013  . Dysphagia, unspecified(787.20) 01/22/2013  . Occlusion and stenosis of carotid artery without mention of cerebral infarction 01/22/2013  . Cerebral thrombosis with cerebral infarction ([place]) 01/22/2013  . Benign prostatic hyperplasia with incomplete bladder emptying 08/26/2012  . Chronic prostatitis 08/26/2012  . Family history of malignant neoplasm of prostate 08/26/2012  . Incomplete emptying of bladder 08/26/2012  . Dysphagia 05/14/2012  . Global aphasia 05/14/2012  . Stroke, acute, thrombotic ([place]) 05/12/2012  . Symptomatic carotid artery stenosis 05/12/2012  . HTN (hypertension)  05/12/2012  . COPD (chronic obstructive pulmonary disease) ([place]) 05/12/2012  . Acute respiratory failure ([place]) 05/12/2012    Maylon Cos, Student Intern 01/15/2020, 10:08 AM  [place] MAIN [facility] SERVICES [address] [place], [place], [address] Phone: [phone]   Fax:  [phone]   Name: Paul Bass MRN: [id number] Date of Birth: 03/10/1946

## 2020-01-17 LAB — CULTURE, URINE COMPREHENSIVE

## 2020-01-19 LAB — URINALYSIS, COMPLETE
Bilirubin, UA: NEGATIVE
Glucose, UA: NEGATIVE
Ketones, UA: NEGATIVE
Nitrite, UA: POSITIVE — AB
Protein,UA: NEGATIVE
Specific Gravity, UA: 1.02 (ref 1.005–1.030)
Urobilinogen, Ur: 0.2 mg/dL (ref 0.2–1.0)
pH, UA: 6.5 (ref 5.0–7.5)

## 2020-01-19 LAB — MICROSCOPIC EXAMINATION: WBC, UA: >30 /hpf — AB (ref 0–5)

## 2020-01-20 ENCOUNTER — Ambulatory Visit: Payer: Medicare HMO | Admitting: Speech Pathology

## 2020-01-20 ENCOUNTER — Other Ambulatory Visit: Payer: Self-pay

## 2020-01-20 ENCOUNTER — Encounter: Payer: Self-pay | Admitting: Speech Pathology

## 2020-01-20 DIAGNOSIS — R41841 Cognitive communication deficit: Secondary | ICD-10-CM

## 2020-01-20 NOTE — Therapy (Signed)
[place] MAIN [facility] SERVICES [address] [place], [place], [address] Phone: [phone]   Fax:  [phone]  Speech Language Pathology Treatment  Speech Therapy Progress Note   Dates of reporting period  12/11/2019   to   01/20/2020   Patient Details  Name: Paul Bass MRN: [id number] Date of Birth: 20-Feb-1946 Referring Provider (SLP): Dr. Manuella Ghazi   Encounter Date: 01/20/2020   End of Session - 01/20/20 1119    Visit Number 10    Number of Visits 17    Date for SLP Re-Evaluation 02/05/20    Authorization Type Medicare    Authorization Time Period Start 12/11/2019    Authorization - Visit Number 10    Authorization - Number of Visits 10    Progress Note Due on Visit 10    SLP Start Time 0900    SLP Stop Time  0950    SLP Time Calculation (min) 50 min    Activity Tolerance Patient tolerated treatment well           Past Medical History:  Diagnosis Date  . Anxiety   . BPH (benign prostatic hypertrophy)   . CAD S/P percutaneous coronary angioplasty   . COPD (chronic obstructive pulmonary disease) ([place])   . Depression   . Diabetes mellitus without complication ([place])   . Dyspnea   . GERD (gastroesophageal reflux disease)   . HTN (hypertension)   . Hyperlipidemia   . Hypothyroidism   . Stroke ([place])   . Tobacco abuse     Past Surgical History:  Procedure Laterality Date  . CARDIAC CATHETERIZATION N/A 12/07/2015   Procedure: Left Heart Cath and Coronary Angiography;  Surgeon: Corey Skains, MD;  Location: [facility] CV LAB;  Service: Cardiovascular;  Laterality: N/A;  . COLONOSCOPY WITH PROPOFOL N/A 02/18/2019   Procedure: COLONOSCOPY WITH PROPOFOL;  Surgeon: Lucilla Lame, MD;  Location: [facility] ENDOSCOPY;  Service: Endoscopy;  Laterality: N/A;  . 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 CORONARY ARTERY BYPASS GRAFT    . CYSTOSCOPY WITH INSERTION OF UROLIFT N/A 04/29/2019   Procedure:  CYSTOSCOPY WITH INSERTION OF UROLIFT;  Surgeon: Abbie Sons, MD;  Location: [facility] ORS;  Service: Urology;  Laterality: N/A;  . ESOPHAGOGASTRODUODENOSCOPY (EGD) WITH PROPOFOL N/A 09/11/2016   Procedure: ESOPHAGOGASTRODUODENOSCOPY (EGD) WITH PROPOFOL;  Surgeon: Lucilla Lame, MD;  Location: [facility] ENDOSCOPY;  Service: Endoscopy;  Laterality: N/A;  . ESOPHAGOGASTRODUODENOSCOPY (EGD) WITH PROPOFOL N/A 10/17/2016   Procedure: ESOPHAGOGASTRODUODENOSCOPY (EGD) WITH PROPOFOL;  Surgeon: Lucilla Lame, MD;  Location: [facility] ENDOSCOPY;  Service: Endoscopy;  Laterality: N/A;  . ESOPHAGOGASTRODUODENOSCOPY (EGD) WITH PROPOFOL N/A 11/13/2017   Procedure: ESOPHAGOGASTRODUODENOSCOPY (EGD) WITH PROPOFOL;  Surgeon: Lucilla Lame, MD;  Location: [facility] ENDOSCOPY;  Service: Endoscopy;  Laterality: N/A;  . pins R lower leg    . rotator cuff replaced     right  . SAVORY DILATION N/A 10/17/2016   Procedure: SAVORY DILATION;  Surgeon: Lucilla Lame, MD;  Location: [facility] ENDOSCOPY;  Service: Endoscopy;  Laterality: N/A;    There were no vitals filed for this visit.   Subjective Assessment - 01/20/20 1119    Subjective Frustrated at times, "I'm so stupid"                 ADULT SLP TREATMENT - 01/20/20 0001      General Information   Behavior/Cognition Alert;Cooperative;Pleasant mood    HPI  Paul Bass is 74 year old man,  S/P CVA in 2013, referred for speech therapy for cognitive deficit post stroke.       Treatment Provided   Treatment provided Cognitive-Linquistic      Pain Assessment   Pain Assessment No/denies pain      Cognitive-Linquistic Treatment   Treatment focused on Aphasia    Skilled Treatment MEMORY: Remembered names of people given visual stimulus at 60% accuracy independently. Improved to 80% when quizzed the second time. COMPREHENSION: Identified wrong item in category (f=5) at 95% accuracy. EXPRESSION: Named category at 45% accuracy. Answered general information questions fluently and coherently  at 70% accuracy. Completed sentences with appropriate missing word at 80% accuracy.      Assessment / Recommendations / Plan   Plan Continue with current plan of care      Progression Toward Goals   Progression toward goals Progressing toward goals            SLP Education - 01/20/20 1119    Education Details Memory strategies    Person(s) Educated Patient    Methods Explanation    Comprehension Verbalized understanding              SLP Long Term Goals - 12/11/19 1143      SLP LONG TERM GOAL #1   Title Patient will generate grammatical, fluent, and cogent sentences to complete abstract/complex linguistic task with 80% accuracy.    Time 8    Period Weeks    Status New    Target Date 02/05/20      SLP LONG TERM GOAL #2   Title Patient will complete complex memory and high level wording activities with 80% accuracy.    Time 8    Period Weeks    Status New    Target Date 02/05/20      SLP LONG TERM GOAL #3   Title Patient will write grammatical and cogent phrases to complete simple/concrete linguistic task with 80% accuracy.    Time 8    Period Weeks    Status New    Target Date 02/05/20            Plan - 01/20/20 1120    Clinical Impression Statement Patient showed proficiency in comprehension skills by identifying the wrong items in categories. He had more difficulty expressing the name of the category. Patient was able to complete sentences with an appropriate word. He expressed that word retrieval is easier in a fill in the blank task versus answering general information questions out loud.    Speech Therapy Frequency 2x / week    Duration Other (comment)    Treatment/Interventions Cognitive reorganization;Compensatory strategies;Language facilitation    Potential to Achieve Goals Good    Potential Considerations Ability to learn/carryover information;Pain level;Family/community support;Co-morbidities;Previous level of function;Cooperation/participation  level;Severity of impairments           Patient will benefit from skilled therapeutic intervention in order to improve the following deficits and impairments:   Cognitive communication deficit    Problem List Patient Active Problem List   Diagnosis Date Noted  . Abnormal ECG 04/21/2019  . Encounter for screening colonoscopy   . Polyp of colon   . Atherosclerotic heart disease of native coronary artery without angina pectoris 01/14/2019  . Depression 01/14/2019  . GERD (gastroesophageal reflux disease) 01/14/2019  . Periodic limb movement disorder (PLMD) 01/14/2019  . Sleep apnea 01/14/2019  . Type 2 diabetes mellitus without complications ([place]) 38/25/0539  . Stricture and stenosis of esophagus   . Problems with  swallowing and mastication   . Food impaction of esophagus   . H/O coronary artery bypass surgery 01/17/2016  . Cerebrovascular accident (CVA) due to embolism ([place]) 01/03/2016  . Hypothyroidism, unspecified 01/03/2016  . HLD (hyperlipidemia) 01/03/2016  . Stable angina ([place]) 11/29/2015  . Benign essential hypertension 05/27/2015  . Bilateral carotid artery stenosis 02/24/2015  . Atherosclerotic peripheral vascular disease ([place]) 09/07/2014  . Bladder calculus 08/12/2014  . Primary osteoarthritis of left knee 03/10/2014  . Elevated prostate specific antigen (PSA) 02/05/2013  . Snoring 01/22/2013  . Sleepiness 01/22/2013  . Dysphagia, unspecified(787.20) 01/22/2013  . Occlusion and stenosis of carotid artery without mention of cerebral infarction 01/22/2013  . Cerebral thrombosis with cerebral infarction ([place]) 01/22/2013  . Benign prostatic hyperplasia with incomplete bladder emptying 08/26/2012  . Chronic prostatitis 08/26/2012  . Family history of malignant neoplasm of prostate 08/26/2012  . Incomplete emptying of bladder 08/26/2012  . Dysphagia 05/14/2012  . Global aphasia 05/14/2012  . Stroke, acute, thrombotic ([place]) 05/12/2012  . Symptomatic carotid artery  stenosis 05/12/2012  . HTN (hypertension) 05/12/2012  . COPD (chronic obstructive pulmonary disease) ([place]) 05/12/2012  . Acute respiratory failure ([place]) 05/12/2012    Maylon Cos, Student Intern 01/20/2020, 11:20 AM  [place] MAIN [facility] SERVICES [address] [place], [place], [address] Phone: [phone]   Fax:  [phone]   Name: Paul Bass MRN: [id number] Date of Birth: 09-Oct-1945

## 2020-01-21 ENCOUNTER — Telehealth: Payer: Self-pay | Admitting: *Deleted

## 2020-01-21 MED ORDER — CIPROFLOXACIN HCL 500 MG PO TABS: 500.0000 mg | ORAL_TABLET | Freq: Two times a day (BID) | ORAL | 0 refills | Status: DC

## 2020-01-21 NOTE — Telephone Encounter (Signed)
-----   Message from Abbie Sons, MD sent at 01/20/2020  7:40 PM EDT ----- Patient still with chronic bacteriuria.  Recommend scheduling office cystoscopy.  Start Cipro 500 mg twice daily 2 days prior to cystoscopy

## 2020-01-21 NOTE — Telephone Encounter (Signed)
LMOM for patient to return call. Cipro sent to pharmacy.

## 2020-01-22 ENCOUNTER — Encounter: Payer: Self-pay | Admitting: Speech Pathology

## 2020-01-22 ENCOUNTER — Other Ambulatory Visit: Payer: Self-pay

## 2020-01-22 ENCOUNTER — Ambulatory Visit: Payer: Medicare HMO | Admitting: Speech Pathology

## 2020-01-22 DIAGNOSIS — R41841 Cognitive communication deficit: Secondary | ICD-10-CM

## 2020-01-22 NOTE — Telephone Encounter (Signed)
Patient came in office. Notified patient as instructed, patient pleased. Discussed follow-up appointments, patient agrees

## 2020-01-22 NOTE — Therapy (Signed)
[place] [facility] SERVICES [address] [place], [place], [address] Phone: [phone]   Fax:  [phone]  Speech Language Pathology Treatment  Patient Details  Name: Paul Bass MRN: [id number] Date of Birth: 08-Oct-1945 Referring Provider (Bass): Dr. Manuella Ghazi   Encounter Date: 01/22/2020   End of Session - 01/22/20 1014    Visit Number 11    Number of Visits 17    Date for Bass Re-Evaluation 02/05/20    Authorization Type Medicare    Authorization Time Period Start 01/22/2020    Authorization - Visit Number 10    Progress Note Due on Visit 10    Bass Start Time 0900    Bass Stop Time  6945    Bass Time Calculation (min) 55 min    Activity Tolerance Patient tolerated treatment well           Past Medical History:  Diagnosis Date   Anxiety    BPH (benign prostatic hypertrophy)    CAD S/P percutaneous coronary angioplasty    COPD (chronic obstructive pulmonary disease) ([place])    Depression    Diabetes mellitus without complication ([name])    Dyspnea    GERD (gastroesophageal reflux disease)    HTN (hypertension)    Hyperlipidemia    Hypothyroidism    Stroke ([place])    Tobacco abuse     Past Surgical History:  Procedure Laterality Date   CARDIAC CATHETERIZATION N/A 12/07/2015   Procedure: Left Heart Cath and Coronary Angiography;  Surgeon: Corey Skains, MD;  Location: [place] [facility];  Service: Cardiovascular;  Laterality: N/A;   COLONOSCOPY WITH PROPOFOL N/A 02/18/2019   Procedure: COLONOSCOPY WITH PROPOFOL;  Surgeon: Lucilla Lame, MD;  Location: [facility] ENDOSCOPY;  Service: Endoscopy;  Laterality: N/A;   CORONARY ANGIOPLASTY WITH STENT PLACEMENT     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   CORONARY ARTERY BYPASS GRAFT     CYSTOSCOPY WITH INSERTION OF UROLIFT N/A 04/29/2019   Procedure: CYSTOSCOPY WITH INSERTION OF UROLIFT;  Surgeon: Abbie Sons, MD;  Location: [facility] ORS;  Service: Urology;  Laterality: N/A;    ESOPHAGOGASTRODUODENOSCOPY (EGD) WITH PROPOFOL N/A 09/11/2016   Procedure: ESOPHAGOGASTRODUODENOSCOPY (EGD) WITH PROPOFOL;  Surgeon: Lucilla Lame, MD;  Location: [facility] ENDOSCOPY;  Service: Endoscopy;  Laterality: N/A;   ESOPHAGOGASTRODUODENOSCOPY (EGD) WITH PROPOFOL N/A 10/17/2016   Procedure: ESOPHAGOGASTRODUODENOSCOPY (EGD) WITH PROPOFOL;  Surgeon: Lucilla Lame, MD;  Location: [facility] ENDOSCOPY;  Service: Endoscopy;  Laterality: N/A;   ESOPHAGOGASTRODUODENOSCOPY (EGD) WITH PROPOFOL N/A 11/13/2017   Procedure: ESOPHAGOGASTRODUODENOSCOPY (EGD) WITH PROPOFOL;  Surgeon: Lucilla Lame, MD;  Location: [facility] ENDOSCOPY;  Service: Endoscopy;  Laterality: N/A;   pins R lower leg     rotator cuff replaced     right   SAVORY DILATION N/A 10/17/2016   Procedure: SAVORY DILATION;  Surgeon: Lucilla Lame, MD;  Location: [facility] ENDOSCOPY;  Service: Endoscopy;  Laterality: N/A;    There were no vitals filed for this visit.   Subjective Assessment - 01/22/20 1013    Subjective Patient remained upbeat for the session                 ADULT Bass TREATMENT - 01/22/20 0001      General Information   Behavior/Cognition Alert;Cooperative;Pleasant mood    HPI  Paul Bass is 74 year old man, S/P CVA in 2013, referred for speech therapy for cognitive deficit post stroke.       Treatment Provided   Treatment provided Cognitive-Linquistic  Pain Assessment   Pain Assessment No/denies pain      Cognitive-Linquistic Treatment   Treatment focused on Aphasia    Skilled Treatment WRITING: Write words with 80% legibility.  VERBAL EXPRESSION:  Explain pictured analogies and next figure in figural sequences with 70% accuracy for clear/cogent responses. REMEBERING NAMES: Recall 8/11 names (photos of people he has "met" over the past sessions.      Assessment / Recommendations / Plan   Plan Continue with current plan of care      Progression Toward Goals   Progression toward goals Progressing toward goals             Bass Education - 01/22/20 1013    Education Details Using lanugage to aid reasoning and remembering    Person(s) Educated Patient    Methods Explanation    Comprehension Verbalized understanding              Bass Long Term Goals - 12/11/19 1143      Bass LONG TERM GOAL #1   Title Patient will generate grammatical, fluent, and cogent sentences to complete abstract/complex linguistic task with 80% accuracy.    Time 8    Period Weeks    Status New    Target Date 02/05/20      Bass LONG TERM GOAL #2   Title Patient will complete complex memory and high level wording activities with 80% accuracy.    Time 8    Period Weeks    Status New    Target Date 02/05/20      Bass LONG TERM GOAL #3   Title Patient will write grammatical and cogent phrases to complete simple/concrete linguistic task with 80% accuracy.    Time 8    Period Weeks    Status New    Target Date 02/05/20            Plan - 01/22/20 1015    Clinical Impression Statement The patient demonstrates improved use of meaningful content words in structured and unstructured verbal tasks. He is remembering names with fewer cues needed.  Handwriting has improved.    Speech Therapy Frequency 2x / week    Duration Other (comment)    Treatment/Interventions Cognitive reorganization;Compensatory strategies;Language facilitation    Potential to Achieve Goals Good    Potential Considerations Ability to learn/carryover information;Pain level;Family/community support;Co-morbidities;Previous level of function;Cooperation/participation level;Severity of impairments    Consulted and Agree with Plan of Care Patient           Patient will benefit from skilled therapeutic intervention in order to improve the following deficits and impairments:   Cognitive communication deficit    Problem List Patient Active Problem List   Diagnosis Date Noted   Abnormal ECG 04/21/2019   Encounter for screening colonoscopy    Polyp  of colon    Atherosclerotic heart disease of native coronary artery without angina pectoris 01/14/2019   Depression 01/14/2019   GERD (gastroesophageal reflux disease) 01/14/2019   Periodic limb movement disorder (PLMD) 01/14/2019   Sleep apnea 01/14/2019   Type 2 diabetes mellitus without complications ([place]) 54/00/8676   Stricture and stenosis of esophagus    Problems with swallowing and mastication    Food impaction of esophagus    H/O coronary artery bypass surgery 01/17/2016   Cerebrovascular accident (CVA) due to embolism ([place]) 01/03/2016   Hypothyroidism, unspecified 01/03/2016   HLD (hyperlipidemia) 01/03/2016   Stable angina ([place]) 11/29/2015   Benign essential hypertension 05/27/2015   Bilateral carotid artery stenosis  02/24/2015   Atherosclerotic peripheral vascular disease ([place]) 09/07/2014   Bladder calculus 08/12/2014   Primary osteoarthritis of left knee 03/10/2014   Elevated prostate specific antigen (PSA) 02/05/2013   Snoring 01/22/2013   Sleepiness 01/22/2013   Dysphagia, unspecified(787.20) 01/22/2013   Occlusion and stenosis of carotid artery without mention of cerebral infarction 01/22/2013   Cerebral thrombosis with cerebral infarction ([place]) 01/22/2013   Benign prostatic hyperplasia with incomplete bladder emptying 08/26/2012   Chronic prostatitis 08/26/2012   Family history of malignant neoplasm of prostate 08/26/2012   Incomplete emptying of bladder 08/26/2012   Dysphagia 05/14/2012   Global aphasia 05/14/2012   Stroke, acute, thrombotic ([place]) 05/12/2012   Symptomatic carotid artery stenosis 05/12/2012   HTN (hypertension) 05/12/2012   COPD (chronic obstructive pulmonary disease) ([place]) 05/12/2012   Acute respiratory failure ([place]) 05/12/2012   Paul Bass, Paul Bass  Paul Bass 01/22/2020, 10:16 AM  [facility] SERVICES [address] [place], [place],  [address] Phone: [phone]   Fax:  [phone]   Name: Paul Bass MRN: [id number] Date of Birth: 01-16-1946

## 2020-01-27 ENCOUNTER — Encounter: Payer: Self-pay | Admitting: Speech Pathology

## 2020-01-27 ENCOUNTER — Ambulatory Visit: Payer: Medicare HMO | Admitting: Speech Pathology

## 2020-01-27 ENCOUNTER — Other Ambulatory Visit: Payer: Self-pay

## 2020-01-27 DIAGNOSIS — R41841 Cognitive communication deficit: Secondary | ICD-10-CM | POA: Diagnosis not present

## 2020-01-27 NOTE — Therapy (Signed)
[place] [facility] SERVICES [address] [place], [place], [address] Phone: [phone]   Fax:  [phone]  Speech Language Pathology Treatment  Patient Details  Name: Paul Bass MRN: [id number] Date of Birth: 1946/01/15 Referring Provider (SLP): Dr. Manuella Ghazi   Encounter Date: 01/27/2020   End of Session - 01/27/20 1331    Visit Number 12    Number of Visits 17    Date for SLP Re-Evaluation 02/05/20    Authorization Type Medicare    Authorization Time Period Start 01/22/2020    Authorization - Visit Number 2    Authorization - Number of Visits --    Progress Note Due on Visit 10    SLP Start Time 0855    SLP Stop Time  0950    SLP Time Calculation (min) 55 min    Activity Tolerance Patient tolerated treatment well           Past Medical History:  Diagnosis Date  . Anxiety   . BPH (benign prostatic hypertrophy)   . CAD S/P percutaneous coronary angioplasty   . COPD (chronic obstructive pulmonary disease) ([place])   . Depression   . Diabetes mellitus without complication ([place])   . Dyspnea   . GERD (gastroesophageal reflux disease)   . HTN (hypertension)   . Hyperlipidemia   . Hypothyroidism   . Stroke ([place])   . Tobacco abuse     Past Surgical History:  Procedure Laterality Date  . CARDIAC CATHETERIZATION N/A 12/07/2015   Procedure: Left Heart Cath and Coronary Angiography;  Surgeon: Corey Skains, MD;  Location: [place] [facility];  Service: Cardiovascular;  Laterality: N/A;  . COLONOSCOPY WITH PROPOFOL N/A 02/18/2019   Procedure: COLONOSCOPY WITH PROPOFOL;  Surgeon: Lucilla Lame, MD;  Location: [facility] ENDOSCOPY;  Service: Endoscopy;  Laterality: N/A;  . 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 CORONARY ARTERY BYPASS GRAFT    . CYSTOSCOPY WITH INSERTION OF UROLIFT N/A 04/29/2019   Procedure: CYSTOSCOPY WITH INSERTION OF UROLIFT;  Surgeon: Abbie Sons, MD;  Location: [facility] ORS;   Service: Urology;  Laterality: N/A;  . ESOPHAGOGASTRODUODENOSCOPY (EGD) WITH PROPOFOL N/A 09/11/2016   Procedure: ESOPHAGOGASTRODUODENOSCOPY (EGD) WITH PROPOFOL;  Surgeon: Lucilla Lame, MD;  Location: [facility] ENDOSCOPY;  Service: Endoscopy;  Laterality: N/A;  . ESOPHAGOGASTRODUODENOSCOPY (EGD) WITH PROPOFOL N/A 10/17/2016   Procedure: ESOPHAGOGASTRODUODENOSCOPY (EGD) WITH PROPOFOL;  Surgeon: Lucilla Lame, MD;  Location: [facility] ENDOSCOPY;  Service: Endoscopy;  Laterality: N/A;  . ESOPHAGOGASTRODUODENOSCOPY (EGD) WITH PROPOFOL N/A 11/13/2017   Procedure: ESOPHAGOGASTRODUODENOSCOPY (EGD) WITH PROPOFOL;  Surgeon: Lucilla Lame, MD;  Location: [facility] ENDOSCOPY;  Service: Endoscopy;  Laterality: N/A;  . pins R lower leg    . rotator cuff replaced     right  . SAVORY DILATION N/A 10/17/2016   Procedure: SAVORY DILATION;  Surgeon: Lucilla Lame, MD;  Location: [facility] ENDOSCOPY;  Service: Endoscopy;  Laterality: N/A;    There were no vitals filed for this visit.   Subjective Assessment - 01/27/20 1330    Subjective Determined, frustrated when he couldn't remember                 ADULT SLP TREATMENT - 01/27/20 0001      General Information   Behavior/Cognition Alert;Cooperative;Pleasant mood    HPI  Paul Bass is 74 year old man, S/P CVA in 2013, referred for speech therapy for cognitive deficit post stroke.       Treatment  Provided   Treatment provided Cognitive-Linquistic      Pain Assessment   Pain Assessment No/denies pain      Cognitive-Linquistic Treatment   Treatment focused on Aphasia    Skilled Treatment MEMORY: Remembered 8/11 names independently. 11/11 with moderate cueing. EXPRESSION: Completed sentence with missing word at 85% accuracy. Wrote word legibly in 80% of opportunities. Described an object thoroughly using word retrieval strategies (naming category, synonyms, characteristics, using gestures) in 3/4 opportunities.      Assessment / Recommendations / Plan   Plan Continue with  current plan of care      Progression Toward Goals   Progression toward goals Progressing toward goals            SLP Education - 01/27/20 1331    Education Details Word retrieval strategies    Person(s) Educated Patient    Methods Explanation    Comprehension Verbalized understanding              SLP Long Term Goals - 12/11/19 1143      SLP LONG TERM GOAL #1   Title Patient will generate grammatical, fluent, and cogent sentences to complete abstract/complex linguistic task with 80% accuracy.    Time 8    Period Weeks    Status New    Target Date 02/05/20      SLP LONG TERM GOAL #2   Title Patient will complete complex memory and high level wording activities with 80% accuracy.    Time 8    Period Weeks    Status New    Target Date 02/05/20      SLP LONG TERM GOAL #3   Title Patient will write grammatical and cogent phrases to complete simple/concrete linguistic task with 80% accuracy.    Time 8    Period Weeks    Status New    Target Date 02/05/20            Plan - 01/27/20 1331    Clinical Impression Statement Patient is continuing to improve handwriting, but needs more help with spelling. He is understanding how to use word retrieval strategies such as gestures and synonyms. He is continuing to learn when each strategy is most effective.    Speech Therapy Frequency 2x / week    Duration Other (comment)    Treatment/Interventions Cognitive reorganization;Compensatory strategies;Language facilitation    Potential to Achieve Goals Good    Potential Considerations Ability to learn/carryover information;Pain level;Family/community support;Co-morbidities;Previous level of function;Cooperation/participation level;Severity of impairments           Patient will benefit from skilled therapeutic intervention in order to improve the following deficits and impairments:   Cognitive communication deficit    Problem List Patient Active Problem List   Diagnosis  Date Noted  . Abnormal ECG 04/21/2019  . Encounter for screening colonoscopy   . Polyp of colon   . Atherosclerotic heart disease of native coronary artery without angina pectoris 01/14/2019  . Depression 01/14/2019  . GERD (gastroesophageal reflux disease) 01/14/2019  . Periodic limb movement disorder (PLMD) 01/14/2019  . Sleep apnea 01/14/2019  . Type 2 diabetes mellitus without complications ([place]) 84/16/6063  . Stricture and stenosis of esophagus   . Problems with swallowing and mastication   . Food impaction of esophagus   . H/O coronary artery bypass surgery 01/17/2016  . Cerebrovascular accident (CVA) due to embolism ([place]) 01/03/2016  . Hypothyroidism, unspecified 01/03/2016  . HLD (hyperlipidemia) 01/03/2016  . Stable angina ([place]) 11/29/2015  . Benign essential  hypertension 05/27/2015  . Bilateral carotid artery stenosis 02/24/2015  . Atherosclerotic peripheral vascular disease ([place]) 09/07/2014  . Bladder calculus 08/12/2014  . Primary osteoarthritis of left knee 03/10/2014  . Elevated prostate specific antigen (PSA) 02/05/2013  . Snoring 01/22/2013  . Sleepiness 01/22/2013  . Dysphagia, unspecified(787.20) 01/22/2013  . Occlusion and stenosis of carotid artery without mention of cerebral infarction 01/22/2013  . Cerebral thrombosis with cerebral infarction ([place]) 01/22/2013  . Benign prostatic hyperplasia with incomplete bladder emptying 08/26/2012  . Chronic prostatitis 08/26/2012  . Family history of malignant neoplasm of prostate 08/26/2012  . Incomplete emptying of bladder 08/26/2012  . Dysphagia 05/14/2012  . Global aphasia 05/14/2012  . Stroke, acute, thrombotic ([place]) 05/12/2012  . Symptomatic carotid artery stenosis 05/12/2012  . HTN (hypertension) 05/12/2012  . COPD (chronic obstructive pulmonary disease) ([place]) 05/12/2012  . Acute respiratory failure ([place]) 05/12/2012    Lou Miner, Student Intern 01/27/2020, 1:35 PM  [place] MAIN [facility] SERVICES [address] [place], [place], [address] Phone: [phone]   Fax:  [phone]   Name: ETAI COPADO MRN: [id number] Date of Birth: May 05, 1946

## 2020-01-29 ENCOUNTER — Encounter: Payer: Self-pay | Admitting: Speech Pathology

## 2020-01-29 ENCOUNTER — Ambulatory Visit: Payer: Medicare HMO | Attending: Neurology | Admitting: Speech Pathology

## 2020-01-29 ENCOUNTER — Other Ambulatory Visit: Payer: Self-pay

## 2020-01-29 DIAGNOSIS — R41841 Cognitive communication deficit: Secondary | ICD-10-CM | POA: Insufficient documentation

## 2020-01-29 NOTE — Therapy (Signed)
[place] MAIN [facility] SERVICES [address] [place], [place], [address] Phone: [phone]   Fax:  [phone]  Speech Language Pathology Treatment  Patient Details  Name: Paul Bass MRN: [id number] Date of Birth: 12-08-1945 Referring Provider (SLP): Dr. Manuella Ghazi   Encounter Date: 01/29/2020   End of Session - 01/29/20 1114    Visit Number 13    Number of Visits 17    Date for SLP Re-Evaluation 02/05/20    Authorization Type Medicare    Authorization Time Period Start 01/22/2020    Authorization - Visit Number 3    Authorization - Number of Visits 10    Progress Note Due on Visit 10    SLP Start Time 5701    SLP Stop Time  0945    SLP Time Calculation (min) 50 min    Activity Tolerance Patient tolerated treatment well           Past Medical History:  Diagnosis Date   Anxiety    BPH (benign prostatic hypertrophy)    CAD S/P percutaneous coronary angioplasty    COPD (chronic obstructive pulmonary disease) ([place])    Depression    Diabetes mellitus without complication ([place])    Dyspnea    GERD (gastroesophageal reflux disease)    HTN (hypertension)    Hyperlipidemia    Hypothyroidism    Stroke ([place])    Tobacco abuse     Past Surgical History:  Procedure Laterality Date   CARDIAC CATHETERIZATION N/A 12/07/2015   Procedure: Left Heart Cath and Coronary Angiography;  Surgeon: Corey Skains, MD;  Location: [place] [facility];  Service: Cardiovascular;  Laterality: N/A;   COLONOSCOPY WITH PROPOFOL N/A 02/18/2019   Procedure: COLONOSCOPY WITH PROPOFOL;  Surgeon: Lucilla Lame, MD;  Location: [facility] ENDOSCOPY;  Service: Endoscopy;  Laterality: N/A;   CORONARY ANGIOPLASTY WITH STENT PLACEMENT     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   CORONARY ARTERY BYPASS GRAFT     CYSTOSCOPY WITH INSERTION OF UROLIFT N/A 04/29/2019   Procedure: CYSTOSCOPY WITH INSERTION OF UROLIFT;  Surgeon: Abbie Sons, MD;  Location: [facility] ORS;   Service: Urology;  Laterality: N/A;   ESOPHAGOGASTRODUODENOSCOPY (EGD) WITH PROPOFOL N/A 09/11/2016   Procedure: ESOPHAGOGASTRODUODENOSCOPY (EGD) WITH PROPOFOL;  Surgeon: Lucilla Lame, MD;  Location: [facility] ENDOSCOPY;  Service: Endoscopy;  Laterality: N/A;   ESOPHAGOGASTRODUODENOSCOPY (EGD) WITH PROPOFOL N/A 10/17/2016   Procedure: ESOPHAGOGASTRODUODENOSCOPY (EGD) WITH PROPOFOL;  Surgeon: Lucilla Lame, MD;  Location: [facility] ENDOSCOPY;  Service: Endoscopy;  Laterality: N/A;   ESOPHAGOGASTRODUODENOSCOPY (EGD) WITH PROPOFOL N/A 11/13/2017   Procedure: ESOPHAGOGASTRODUODENOSCOPY (EGD) WITH PROPOFOL;  Surgeon: Lucilla Lame, MD;  Location: [facility] ENDOSCOPY;  Service: Endoscopy;  Laterality: N/A;   pins R lower leg     rotator cuff replaced     right   SAVORY DILATION N/A 10/17/2016   Procedure: SAVORY DILATION;  Surgeon: Lucilla Lame, MD;  Location: [facility] ENDOSCOPY;  Service: Endoscopy;  Laterality: N/A;    There were no vitals filed for this visit.   Subjective Assessment - 01/29/20 1114    Subjective That was really tough                 ADULT SLP TREATMENT - 01/29/20 0001      General Information   Behavior/Cognition Alert;Cooperative;Pleasant mood    HPI  Paul Bass is 74 year old man, S/P CVA in 2013, referred for speech therapy for cognitive deficit post stroke.       Treatment Provided  Treatment provided Cognitive-Linquistic      Pain Assessment   Pain Assessment No/denies pain      Cognitive-Linquistic Treatment   Treatment focused on Aphasia    Skilled Treatment EXPRESSION: Named items given their attributes at 85% accuracy independently. Named subcategories at 60% accuracy with moderate cueing ("other items in this category could be __"). MEMORY: Remembered 8/11 names of people given visual stimulus.      Assessment / Recommendations / Plan   Plan Continue with current plan of care      Progression Toward Goals   Progression toward goals Progressing toward goals             SLP Education - 01/29/20 1114    Education Details Memory strategies    Person(s) Educated Patient    Methods Explanation    Comprehension Verbalized understanding              SLP Long Term Goals - 12/11/19 1143      SLP LONG TERM GOAL #1   Title Patient will generate grammatical, fluent, and cogent sentences to complete abstract/complex linguistic task with 80% accuracy.    Time 8    Period Weeks    Status New    Target Date 02/05/20      SLP LONG TERM GOAL #2   Title Patient will complete complex memory and high level wording activities with 80% accuracy.    Time 8    Period Weeks    Status New    Target Date 02/05/20      SLP LONG TERM GOAL #3   Title Patient will write grammatical and cogent phrases to complete simple/concrete linguistic task with 80% accuracy.    Time 8    Period Weeks    Status New    Target Date 02/05/20            Plan - 01/29/20 1114    Clinical Impression Statement Patient is showing improvement in word finding exercises. He continues to have difficulty naming categories. Better at naming items in a category than naming the general category.    Speech Therapy Frequency 2x / week    Duration Other (comment)    Treatment/Interventions Cognitive reorganization;Compensatory strategies;Language facilitation    Potential to Achieve Goals Good    Potential Considerations Ability to learn/carryover information;Pain level;Family/community support;Co-morbidities;Previous level of function;Cooperation/participation level;Severity of impairments           Patient will benefit from skilled therapeutic intervention in order to improve the following deficits and impairments:   Cognitive communication deficit    Problem List Patient Active Problem List   Diagnosis Date Noted   Abnormal ECG 04/21/2019   Encounter for screening colonoscopy    Polyp of colon    Atherosclerotic heart disease of native coronary artery without  angina pectoris 01/14/2019   Depression 01/14/2019   GERD (gastroesophageal reflux disease) 01/14/2019   Periodic limb movement disorder (PLMD) 01/14/2019   Sleep apnea 01/14/2019   Type 2 diabetes mellitus without complications ([place]) 17/00/1749   Stricture and stenosis of esophagus    Problems with swallowing and mastication    Food impaction of esophagus    H/O coronary artery bypass surgery 01/17/2016   Cerebrovascular accident (CVA) due to embolism ([place]) 01/03/2016   Hypothyroidism, unspecified 01/03/2016   HLD (hyperlipidemia) 01/03/2016   Stable angina ([place]) 11/29/2015   Benign essential hypertension 05/27/2015   Bilateral carotid artery stenosis 02/24/2015   Atherosclerotic peripheral vascular disease ([place]) 09/07/2014   Bladder calculus  08/12/2014   Primary osteoarthritis of left knee 03/10/2014   Elevated prostate specific antigen (PSA) 02/05/2013   Snoring 01/22/2013   Sleepiness 01/22/2013   Dysphagia, unspecified(787.20) 01/22/2013   Occlusion and stenosis of carotid artery without mention of cerebral infarction 01/22/2013   Cerebral thrombosis with cerebral infarction ([place]) 01/22/2013   Benign prostatic hyperplasia with incomplete bladder emptying 08/26/2012   Chronic prostatitis 08/26/2012   Family history of malignant neoplasm of prostate 08/26/2012   Incomplete emptying of bladder 08/26/2012   Dysphagia 05/14/2012   Global aphasia 05/14/2012   Stroke, acute, thrombotic ([place]) 05/12/2012   Symptomatic carotid artery stenosis 05/12/2012   HTN (hypertension) 05/12/2012   COPD (chronic obstructive pulmonary disease) ([place]) 05/12/2012   Acute respiratory failure ([place]) 05/12/2012    Maylon Cos, Student Intern 01/29/2020, 11:15 AM  [place] [address] [place], [place], [address] Phone: [phone]   Fax:  [phone]   Name: LAWRNCE REYEZ MRN: [id number] Date of  Birth: 12/22/1945

## 2020-02-03 ENCOUNTER — Other Ambulatory Visit: Payer: Self-pay

## 2020-02-03 ENCOUNTER — Encounter: Payer: Self-pay | Admitting: Speech Pathology

## 2020-02-03 ENCOUNTER — Ambulatory Visit: Payer: Medicare HMO | Admitting: Speech Pathology

## 2020-02-03 DIAGNOSIS — R41841 Cognitive communication deficit: Secondary | ICD-10-CM

## 2020-02-03 NOTE — Therapy (Signed)
[place] MAIN [facility] SERVICES [address] [place], [place], [address] Phone: [phone]   Fax:  [phone]  Speech Language Pathology Treatment  Patient Details  Name: Paul Bass MRN: [id number] Date of Birth: 01-09-1946 Referring Provider (SLP): Dr. Manuella Ghazi   Encounter Date: 02/03/2020   End of Session - 02/03/20 1246    Visit Number 14    Number of Visits 17    Date for SLP Re-Evaluation 02/05/20    Authorization Type Medicare    Authorization Time Period Start 01/22/2020    Authorization - Visit Number 4    Authorization - Number of Visits 10    Progress Note Due on Visit 10    SLP Start Time 1100    SLP Stop Time  1145    SLP Time Calculation (min) 45 min    Activity Tolerance Patient tolerated treatment well           Past Medical History:  Diagnosis Date  . Anxiety   . BPH (benign prostatic hypertrophy)   . CAD S/P percutaneous coronary angioplasty   . COPD (chronic obstructive pulmonary disease) ([place])   . Depression   . Diabetes mellitus without complication ([place])   . Dyspnea   . GERD (gastroesophageal reflux disease)   . HTN (hypertension)   . Hyperlipidemia   . Hypothyroidism   . Stroke ([place])   . Tobacco abuse     Past Surgical History:  Procedure Laterality Date  . CARDIAC CATHETERIZATION N/A 12/07/2015   Procedure: Left Heart Cath and Coronary Angiography;  Surgeon: Corey Skains, MD;  Location: [place] CV LAB;  Service: Cardiovascular;  Laterality: N/A;  . COLONOSCOPY WITH PROPOFOL N/A 02/18/2019   Procedure: COLONOSCOPY WITH PROPOFOL;  Surgeon: Lucilla Lame, MD;  Location: [facility] ENDOSCOPY;  Service: Endoscopy;  Laterality: N/A;  . 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 CORONARY ARTERY BYPASS GRAFT    . CYSTOSCOPY WITH INSERTION OF UROLIFT N/A 04/29/2019   Procedure: CYSTOSCOPY WITH INSERTION OF UROLIFT;  Surgeon: Abbie Sons, MD;  Location: [facility] ORS;   Service: Urology;  Laterality: N/A;  . ESOPHAGOGASTRODUODENOSCOPY (EGD) WITH PROPOFOL N/A 09/11/2016   Procedure: ESOPHAGOGASTRODUODENOSCOPY (EGD) WITH PROPOFOL;  Surgeon: Lucilla Lame, MD;  Location: [facility] ENDOSCOPY;  Service: Endoscopy;  Laterality: N/A;  . ESOPHAGOGASTRODUODENOSCOPY (EGD) WITH PROPOFOL N/A 10/17/2016   Procedure: ESOPHAGOGASTRODUODENOSCOPY (EGD) WITH PROPOFOL;  Surgeon: Lucilla Lame, MD;  Location: [facility] ENDOSCOPY;  Service: Endoscopy;  Laterality: N/A;  . ESOPHAGOGASTRODUODENOSCOPY (EGD) WITH PROPOFOL N/A 11/13/2017   Procedure: ESOPHAGOGASTRODUODENOSCOPY (EGD) WITH PROPOFOL;  Surgeon: Lucilla Lame, MD;  Location: [facility] ENDOSCOPY;  Service: Endoscopy;  Laterality: N/A;  . pins R lower leg    . rotator cuff replaced     right  . SAVORY DILATION N/A 10/17/2016   Procedure: SAVORY DILATION;  Surgeon: Lucilla Lame, MD;  Location: [facility] ENDOSCOPY;  Service: Endoscopy;  Laterality: N/A;    There were no vitals filed for this visit.   Subjective Assessment - 02/03/20 1246    Subjective Determined, willing to participate                 ADULT SLP TREATMENT - 02/03/20 0001      General Information   Behavior/Cognition Alert;Cooperative;Pleasant mood    HPI  Paul Bass is 74 year old man, S/P CVA in 2013, referred for speech therapy for cognitive deficit post stroke.       Treatment Provided  Treatment provided Cognitive-Linquistic      Pain Assessment   Pain Assessment No/denies pain      Cognitive-Linquistic Treatment   Treatment focused on Aphasia    Skilled Treatment MEMORY: Remembered names of people at 85% accuracy independently. EXPRESSION: Came up with logical and coherent endings to sentences at 70% accuracy. Came up with synonyms to words at 75% accuracy with mid to moderate cueing (semantic). Described common objects using word retrieval strategies (gestures, synonyms, categories, etc.).      Assessment / Recommendations / Plan   Plan Continue with current  plan of care      Progression Toward Goals   Progression toward goals Progressing toward goals            SLP Education - 02/03/20 1246    Education Details Multi-modal communication    Person(s) Educated Patient    Methods Explanation    Comprehension Verbalized understanding              SLP Long Term Goals - 12/11/19 1143      SLP LONG TERM GOAL #1   Title Patient will generate grammatical, fluent, and cogent sentences to complete abstract/complex linguistic task with 80% accuracy.    Time 8    Period Weeks    Status New    Target Date 02/05/20      SLP LONG TERM GOAL #2   Title Patient will complete complex memory and high level wording activities with 80% accuracy.    Time 8    Period Weeks    Status New    Target Date 02/05/20      SLP LONG TERM GOAL #3   Title Patient will write grammatical and cogent phrases to complete simple/concrete linguistic task with 80% accuracy.    Time 8    Period Weeks    Status New    Target Date 02/05/20            Plan - 02/03/20 1247    Clinical Impression Statement Patient is continuing to work on memory by using cueing strategies. He is utilizing more word retrieval strategies to describe a word when he cannot think of it (gestures, synonyms, etc.). Continues to work on Administrator, [employer] and spelling.    Speech Therapy Frequency 2x / week    Duration Other (comment)    Treatment/Interventions Cognitive reorganization;Compensatory strategies;Language facilitation    Potential to Achieve Goals Good    Potential Considerations Ability to learn/carryover information;Pain level;Family/community support;Co-morbidities;Previous level of function;Cooperation/participation level;Severity of impairments           Patient will benefit from skilled therapeutic intervention in order to improve the following deficits and impairments:   Cognitive communication deficit    Problem List Patient Active Problem List   Diagnosis Date  Noted  . Abnormal ECG 04/21/2019  . Encounter for screening colonoscopy   . Polyp of colon   . Atherosclerotic heart disease of native coronary artery without angina pectoris 01/14/2019  . Depression 01/14/2019  . GERD (gastroesophageal reflux disease) 01/14/2019  . Periodic limb movement disorder (PLMD) 01/14/2019  . Sleep apnea 01/14/2019  . Type 2 diabetes mellitus without complications ([place]) 42/35/3614  . Stricture and stenosis of esophagus   . Problems with swallowing and mastication   . Food impaction of esophagus   . H/O coronary artery bypass surgery 01/17/2016  . Cerebrovascular accident (CVA) due to embolism ([place]) 01/03/2016  . Hypothyroidism, unspecified 01/03/2016  . HLD (hyperlipidemia) 01/03/2016  . Stable angina ([place]) 11/29/2015  .  Benign essential hypertension 05/27/2015  . Bilateral carotid artery stenosis 02/24/2015  . Atherosclerotic peripheral vascular disease ([place]) 09/07/2014  . Bladder calculus 08/12/2014  . Primary osteoarthritis of left knee 03/10/2014  . Elevated prostate specific antigen (PSA) 02/05/2013  . Snoring 01/22/2013  . Sleepiness 01/22/2013  . Dysphagia, unspecified(787.20) 01/22/2013  . Occlusion and stenosis of carotid artery without mention of cerebral infarction 01/22/2013  . Cerebral thrombosis with cerebral infarction ([place]) 01/22/2013  . Benign prostatic hyperplasia with incomplete bladder emptying 08/26/2012  . Chronic prostatitis 08/26/2012  . Family history of malignant neoplasm of prostate 08/26/2012  . Incomplete emptying of bladder 08/26/2012  . Dysphagia 05/14/2012  . Global aphasia 05/14/2012  . Stroke, acute, thrombotic ([place]) 05/12/2012  . Symptomatic carotid artery stenosis 05/12/2012  . HTN (hypertension) 05/12/2012  . COPD (chronic obstructive pulmonary disease) ([place]) 05/12/2012  . Acute respiratory failure ([place]) 05/12/2012    Maylon Cos, Student Intern 02/03/2020, 12:47 PM  [place] MAIN [facility] SERVICES [address] [place], [place], [address] Phone: [phone]   Fax:  [phone]   Name: Paul Bass MRN: [id number] Date of Birth: Sep 04, 1945

## 2020-02-05 ENCOUNTER — Ambulatory Visit: Payer: Medicare HMO | Admitting: Speech Pathology

## 2020-02-05 ENCOUNTER — Encounter: Payer: Self-pay | Admitting: Speech Pathology

## 2020-02-05 ENCOUNTER — Other Ambulatory Visit: Payer: Self-pay

## 2020-02-05 DIAGNOSIS — R41841 Cognitive communication deficit: Secondary | ICD-10-CM

## 2020-02-05 NOTE — Therapy (Signed)
[place] MAIN [facility] SERVICES [address] [place], [place], [address] Phone: [phone]   Fax:  [phone]  Speech Language Pathology Treatment  Patient Details  Name: Paul Bass MRN: [id number] Date of Birth: [birth year] Referring Provider (SLP): Dr. Manuella Ghazi   Encounter Date: 02/05/2020   End of Session - 02/05/20 1302    Visit Number 15    Number of Visits 17    Date for SLP Re-Evaluation 02/05/20    Authorization Type Medicare    Authorization Time Period Start 01/22/2020    Authorization - Visit Number 5    Authorization - Number of Visits 10    Progress Note Due on Visit 10    SLP Start Time 1055    SLP Stop Time  1140    SLP Time Calculation (min) 45 min    Activity Tolerance Patient tolerated treatment well           Past Medical History:  Diagnosis Date  . Anxiety   . BPH (benign prostatic hypertrophy)   . CAD S/P percutaneous coronary angioplasty   . COPD (chronic obstructive pulmonary disease) ([place])   . Depression   . Diabetes mellitus without complication ([place])   . Dyspnea   . GERD (gastroesophageal reflux disease)   . HTN (hypertension)   . Hyperlipidemia   . Hypothyroidism   . Stroke ([place])   . Tobacco abuse     Past Surgical History:  Procedure Laterality Date  . CARDIAC CATHETERIZATION N/A 12/07/2015   Procedure: Left Heart Cath and Coronary Angiography;  Surgeon: Corey Skains, MD;  Location: [place] CV LAB;  Service: Cardiovascular;  Laterality: N/A;  . COLONOSCOPY WITH PROPOFOL N/A 02/18/2019   Procedure: COLONOSCOPY WITH PROPOFOL;  Surgeon: Lucilla Lame, MD;  Location: [facility] ENDOSCOPY;  Service: Endoscopy;  Laterality: N/A;  . 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 CORONARY ARTERY BYPASS GRAFT    . CYSTOSCOPY WITH INSERTION OF UROLIFT N/A 04/29/2019   Procedure: CYSTOSCOPY WITH INSERTION OF UROLIFT;  Surgeon: Abbie Sons, MD;  Location: [facility] ORS;   Service: Urology;  Laterality: N/A;  . ESOPHAGOGASTRODUODENOSCOPY (EGD) WITH PROPOFOL N/A 09/11/2016   Procedure: ESOPHAGOGASTRODUODENOSCOPY (EGD) WITH PROPOFOL;  Surgeon: Lucilla Lame, MD;  Location: [facility] ENDOSCOPY;  Service: Endoscopy;  Laterality: N/A;  . ESOPHAGOGASTRODUODENOSCOPY (EGD) WITH PROPOFOL N/A 10/17/2016   Procedure: ESOPHAGOGASTRODUODENOSCOPY (EGD) WITH PROPOFOL;  Surgeon: Lucilla Lame, MD;  Location: [facility] ENDOSCOPY;  Service: Endoscopy;  Laterality: N/A;  . ESOPHAGOGASTRODUODENOSCOPY (EGD) WITH PROPOFOL N/A 11/13/2017   Procedure: ESOPHAGOGASTRODUODENOSCOPY (EGD) WITH PROPOFOL;  Surgeon: Lucilla Lame, MD;  Location: [facility] ENDOSCOPY;  Service: Endoscopy;  Laterality: N/A;  . pins R lower leg    . rotator cuff replaced     right  . SAVORY DILATION N/A 10/17/2016   Procedure: SAVORY DILATION;  Surgeon: Lucilla Lame, MD;  Location: [facility] ENDOSCOPY;  Service: Endoscopy;  Laterality: N/A;    There were no vitals filed for this visit.   Subjective Assessment - 02/05/20 1301    Subjective "I stayed up all night remembering these names"                 ADULT SLP TREATMENT - 02/05/20 0001      General Information   Behavior/Cognition Alert;Cooperative;Pleasant mood    HPI  Paul Bass is 74 year old man, S/P CVA in 2013, referred for speech therapy for cognitive deficit post stroke.  Treatment Provided   Treatment provided Cognitive-Linquistic      Pain Assessment   Pain Assessment No/denies pain      Cognitive-Linquistic Treatment   Treatment focused on Aphasia    Skilled Treatment MEMORY: Remembered 9/11 names of people independently. EXPRESSION: Came up with sentences given one key word. Wrote out sentences at 70% accuracy. VISUAL REASONING: Completed figural sequence drawings at 75% accuracy with mid to moderate cueing ("look at how the rectangle is rotating").      Assessment / Recommendations / Plan   Plan Continue with current plan of care      Progression  Toward Goals   Progression toward goals Progressing toward goals            SLP Education - 02/05/20 1302    Education Details Recognizing patterns    Person(s) Educated Patient    Methods Explanation    Comprehension Verbalized understanding              SLP Long Term Goals - 12/11/19 1143      SLP LONG TERM GOAL #1   Title Patient will generate grammatical, fluent, and cogent sentences to complete abstract/complex linguistic task with 80% accuracy.    Time 8    Period Weeks    Status New    Target Date 02/05/20      SLP LONG TERM GOAL #2   Title Patient will complete complex memory and high level wording activities with 80% accuracy.    Time 8    Period Weeks    Status New    Target Date 02/05/20      SLP LONG TERM GOAL #3   Title Patient will write grammatical and cogent phrases to complete simple/concrete linguistic task with 80% accuracy.    Time 8    Period Weeks    Status New    Target Date 02/05/20            Plan - 02/05/20 1303    Clinical Impression Statement Patient demonstrated good reasoning skills with figural sequences. Required more prompting with patterns that changed 3 factors. When writing out sentences, he worked on coming up with a plan of what to write, thinking about spelling, and then writing big. Will continue to engage patient in tasks that require planning and multi-tasking.    Speech Therapy Frequency 2x / week    Duration Other (comment)    Treatment/Interventions Cognitive reorganization;Compensatory strategies;Language facilitation    Potential to Achieve Goals Good    Potential Considerations Ability to learn/carryover information;Pain level;Family/community support;Co-morbidities;Previous level of function;Cooperation/participation level;Severity of impairments           Patient will benefit from skilled therapeutic intervention in order to improve the following deficits and impairments:   Cognitive communication  deficit    Problem List Patient Active Problem List   Diagnosis Date Noted  . Abnormal ECG 04/21/2019  . Encounter for screening colonoscopy   . Polyp of colon   . Atherosclerotic heart disease of native coronary artery without angina pectoris 01/14/2019  . Depression 01/14/2019  . GERD (gastroesophageal reflux disease) 01/14/2019  . Periodic limb movement disorder (PLMD) 01/14/2019  . Sleep apnea 01/14/2019  . Type 2 diabetes mellitus without complications ([place]) 67/61/9509  . Stricture and stenosis of esophagus   . Problems with swallowing and mastication   . Food impaction of esophagus   . H/O coronary artery bypass surgery 01/17/2016  . Cerebrovascular accident (CVA) due to embolism ([place]) 01/03/2016  . Hypothyroidism, unspecified  01/03/2016  . HLD (hyperlipidemia) 01/03/2016  . Stable angina ([place]) 11/29/2015  . Benign essential hypertension 05/27/2015  . Bilateral carotid artery stenosis 02/24/2015  . Atherosclerotic peripheral vascular disease ([place]) 09/07/2014  . Bladder calculus 08/12/2014  . Primary osteoarthritis of left knee 03/10/2014  . Elevated prostate specific antigen (PSA) 02/05/2013  . Snoring 01/22/2013  . Sleepiness 01/22/2013  . Dysphagia, unspecified(787.20) 01/22/2013  . Occlusion and stenosis of carotid artery without mention of cerebral infarction 01/22/2013  . Cerebral thrombosis with cerebral infarction ([place]) 01/22/2013  . Benign prostatic hyperplasia with incomplete bladder emptying 08/26/2012  . Chronic prostatitis 08/26/2012  . Family history of malignant neoplasm of prostate 08/26/2012  . Incomplete emptying of bladder 08/26/2012  . Dysphagia 05/14/2012  . Global aphasia 05/14/2012  . Stroke, acute, thrombotic ([place]) 05/12/2012  . Symptomatic carotid artery stenosis 05/12/2012  . HTN (hypertension) 05/12/2012  . COPD (chronic obstructive pulmonary disease) ([place]) 05/12/2012  . Acute respiratory failure ([place]) 05/12/2012    Maylon Cos,  Student Intern 02/05/2020, 1:03 PM  [place] MAIN [facility] SERVICES [address] [place], [place], [address] Phone: [phone]   Fax:  [phone]   Name: Paul Bass MRN: [id number] Date of Birth: 03-30-1946

## 2020-02-07 NOTE — Progress Notes (Signed)
   02/09/2020 CC:  Chief Complaint  Patient presents with  . Cysto    [id number] Paul Bass is a 74 y.o. male who presents today for a cystoscopy.  -UroLift and cystoscopic removal of a small bladder calculus on 04/29/2019 - Foley catheter removed 05/08/2019 but urinary hesitancy, intermittent urinary stream, frequency and urgency persisted -IPSS 05/30/2019 was 33/35 -IPSS preop 21/35 -Stable urinary symtoms today -Remains on tamsulosin daily -Urine cultures October 2020 and January 2021 grew Pseudomonas    Blood pressure 123/72, pulse 100, height 5\' 11"  (1.803 m), weight 230 lb (104.3 kg). NED. A&Ox3.   No respiratory distress   Abd soft, NT, ND Normal phallus with bilateral descended testicles  Cystoscopy Procedure Note  Patient identification was confirmed, informed consent was obtained, and patient was prepped using Betadine solution.  Lidocaine jelly was administered per urethral meatus.     Pre-Procedure: - Inspection reveals a normal caliber ureteral meatus.  Procedure: The flexible cystoscope was introduced without difficulty - No urethral strictures/lesions are present. -Lateral lobe enlargement but anterior channel present - Bilateral ureteral orifices identified - Bladder mucosa  reveals scattered erythema endoscopically consistent with cystitis - No bladder stones or foreign bodies -Implants not visible   Retroflexion showed no abnormalities.    Post-Procedure: - Patient tolerated the procedure well  Urinalysis Showed WBC >30   Assessment/ Plan: 1. BPH with LUTS -No bothersome voiding symptoms at this time. -Cysto showed the internal channel was present. -Urinalysis results showed WBC >30. -Started Cipro 3 days prior to cystoscopy.    Follow up in 3 months with bladder scan for PVR   I, Selena Batten, am acting as a scribe for Dr. Nicki Reaper C. Halvor Behrend,  I have reviewed the above documentation for accuracy and completeness, and I agree with the  above.   Abbie Sons, MD

## 2020-02-08 ENCOUNTER — Other Ambulatory Visit: Payer: Self-pay | Admitting: Physician Assistant

## 2020-02-08 DIAGNOSIS — N401 Enlarged prostate with lower urinary tract symptoms: Secondary | ICD-10-CM

## 2020-02-09 ENCOUNTER — Encounter: Payer: Self-pay | Admitting: Urology

## 2020-02-09 ENCOUNTER — Encounter: Payer: Self-pay | Admitting: Speech Pathology

## 2020-02-09 ENCOUNTER — Ambulatory Visit: Payer: Medicare HMO | Admitting: Urology

## 2020-02-09 ENCOUNTER — Other Ambulatory Visit: Payer: Self-pay

## 2020-02-09 ENCOUNTER — Ambulatory Visit: Payer: Medicare HMO | Admitting: Speech Pathology

## 2020-02-09 VITALS — BP 123/72 | HR 100 | Ht 71.0 in | Wt 230.0 lb

## 2020-02-09 DIAGNOSIS — R41841 Cognitive communication deficit: Secondary | ICD-10-CM | POA: Diagnosis not present

## 2020-02-09 DIAGNOSIS — N401 Enlarged prostate with lower urinary tract symptoms: Secondary | ICD-10-CM

## 2020-02-09 LAB — URINALYSIS, COMPLETE
Bilirubin, UA: NEGATIVE
Glucose, UA: NEGATIVE
Ketones, UA: NEGATIVE
Nitrite, UA: NEGATIVE
Protein,UA: NEGATIVE
RBC, UA: NEGATIVE
Specific Gravity, UA: 1.025 (ref 1.005–1.030)
Urobilinogen, Ur: 0.2 mg/dL (ref 0.2–1.0)
pH, UA: 6 (ref 5.0–7.5)

## 2020-02-09 LAB — MICROSCOPIC EXAMINATION
Bacteria, UA: NONE SEEN
WBC, UA: >30 /hpf — AB (ref 0–5)

## 2020-02-09 NOTE — Therapy (Signed)
[place] MAIN [facility] SERVICES [address] [place], [place], [address] Phone: [phone]   Fax:  [phone]  Speech Language Pathology Treatment  Patient Details  Name: Paul Bass MRN: [id number] Date of Birth: 09-20-1945 Referring Provider (SLP): Dr. Manuella Ghazi   Encounter Date: 02/09/2020   End of Session - 02/09/20 1325    Visit Number 16    Number of Visits 36    Date for SLP Re-Evaluation 04/09/20    Authorization Type Medicare    Authorization Time Period Start 01/22/2020    Authorization - Visit Number 6    Authorization - Number of Visits 10    Progress Note Due on Visit 10    SLP Start Time 7681    SLP Stop Time  0945    SLP Time Calculation (min) 50 min    Activity Tolerance Patient tolerated treatment well           Past Medical History:  Diagnosis Date   Anxiety    BPH (benign prostatic hypertrophy)    CAD S/P percutaneous coronary angioplasty    COPD (chronic obstructive pulmonary disease) ([place])    Depression    Diabetes mellitus without complication ([place])    Dyspnea    GERD (gastroesophageal reflux disease)    HTN (hypertension)    Hyperlipidemia    Hypothyroidism    Stroke ([place])    Tobacco abuse     Past Surgical History:  Procedure Laterality Date   CARDIAC CATHETERIZATION N/A 12/07/2015   Procedure: Left Heart Cath and Coronary Angiography;  Surgeon: Corey Skains, MD;  Location: [place] CV [facility];  Service: Cardiovascular;  Laterality: N/A;   COLONOSCOPY WITH PROPOFOL N/A 02/18/2019   Procedure: COLONOSCOPY WITH PROPOFOL;  Surgeon: Lucilla Lame, MD;  Location: [facility] ENDOSCOPY;  Service: Endoscopy;  Laterality: N/A;   CORONARY ANGIOPLASTY WITH STENT PLACEMENT     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   CORONARY ARTERY BYPASS GRAFT     CYSTOSCOPY WITH INSERTION OF UROLIFT N/A 04/29/2019   Procedure: CYSTOSCOPY WITH INSERTION OF UROLIFT;  Surgeon: Abbie Sons, MD;  Location: [facility] ORS;   Service: Urology;  Laterality: N/A;   ESOPHAGOGASTRODUODENOSCOPY (EGD) WITH PROPOFOL N/A 09/11/2016   Procedure: ESOPHAGOGASTRODUODENOSCOPY (EGD) WITH PROPOFOL;  Surgeon: Lucilla Lame, MD;  Location: [facility] ENDOSCOPY;  Service: Endoscopy;  Laterality: N/A;   ESOPHAGOGASTRODUODENOSCOPY (EGD) WITH PROPOFOL N/A 10/17/2016   Procedure: ESOPHAGOGASTRODUODENOSCOPY (EGD) WITH PROPOFOL;  Surgeon: Lucilla Lame, MD;  Location: [facility] ENDOSCOPY;  Service: Endoscopy;  Laterality: N/A;   ESOPHAGOGASTRODUODENOSCOPY (EGD) WITH PROPOFOL N/A 11/13/2017   Procedure: ESOPHAGOGASTRODUODENOSCOPY (EGD) WITH PROPOFOL;  Surgeon: Lucilla Lame, MD;  Location: [facility] ENDOSCOPY;  Service: Endoscopy;  Laterality: N/A;   pins R lower leg     rotator cuff replaced     right   SAVORY DILATION N/A 10/17/2016   Procedure: SAVORY DILATION;  Surgeon: Lucilla Lame, MD;  Location: [facility] ENDOSCOPY;  Service: Endoscopy;  Laterality: N/A;    There were no vitals filed for this visit.   Subjective Assessment - 02/09/20 1325    Subjective Youre making my brain work hard                 ADULT SLP TREATMENT - 02/09/20 0001      General Information   Behavior/Cognition Alert;Cooperative;Pleasant mood    HPI  Paul Bass is 74 year old man, S/P CVA in 2013, referred for speech therapy for cognitive deficit post stroke.       Treatment  Provided   Treatment provided Cognitive-Linquistic      Pain Assessment   Pain Assessment No/denies pain      Cognitive-Linquistic Treatment   Treatment focused on Aphasia    Skilled Treatment MEMORY: Remembered names of people in 11/11 opportunities. EXPRESSION: Came up with synonyms for words at 65% independently; improved to 90% with mid to moderate cueing. VISUAL REASONING: Completed figural sequences (varying number of factors) by drawing the next image with moderate cueing ("first look at the overall shape, then focus on shading").      Assessment / Recommendations / Plan   Plan  Continue with current plan of care      Progression Toward Goals   Progression toward goals Progressing toward goals            SLP Education - 02/09/20 1325    Education Details Word retrieval strategies    Person(s) Educated Patient    Methods Explanation    Comprehension Verbalized understanding              SLP Long Term Goals - 02/09/20 1414      SLP LONG TERM GOAL #1   Title Patient will generate grammatical, fluent, and cogent sentences to complete abstract/complex linguistic task with 80% accuracy.    Time 8    Period Weeks    Status Partially Met    Target Date 04/09/20      SLP LONG TERM GOAL #2   Title Patient will complete complex memory and high level wording activities with 80% accuracy.    Time 8    Period Weeks    Status Partially Met    Target Date 04/09/20      SLP LONG TERM GOAL #3   Title Patient will write grammatical and cogent phrases to complete simple/concrete linguistic task with 80% accuracy.    Time 8    Period Weeks    Status Partially Met    Target Date 04/09/20            Plan - 02/09/20 1326    Clinical Impression Statement Patient showed improvement in memory strategies today by remembering all names of people. He requires a moderate level of cueing to come up with words, but continues to work on word retrieval strategies. Showed improvement in visual reasoning.    Speech Therapy Frequency 2x / week    Duration Other (comment)    Treatment/Interventions Cognitive reorganization;Compensatory strategies;Language facilitation    Potential to Achieve Goals Good    Potential Considerations Ability to learn/carryover information;Pain level;Family/community support;Co-morbidities;Previous level of function;Cooperation/participation level;Severity of impairments           Patient will benefit from skilled therapeutic intervention in order to improve the following deficits and impairments:   Cognitive communication deficit - Plan:  SLP plan of care cert/re-cert    Problem List Patient Active Problem List   Diagnosis Date Noted   Abnormal ECG 04/21/2019   Encounter for screening colonoscopy    Polyp of colon    Atherosclerotic heart disease of native coronary artery without angina pectoris 01/14/2019   Depression 01/14/2019   GERD (gastroesophageal reflux disease) 01/14/2019   Periodic limb movement disorder (PLMD) 01/14/2019   Sleep apnea 01/14/2019   Type 2 diabetes mellitus without complications ([place]) 61/95/0932   Stricture and stenosis of esophagus    Problems with swallowing and mastication    Food impaction of esophagus    H/O coronary artery bypass surgery 01/17/2016   Cerebrovascular accident (CVA) due to embolism (  [place]) 01/03/2016   Hypothyroidism, unspecified 01/03/2016   HLD (hyperlipidemia) 01/03/2016   Stable angina ([place]) 11/29/2015   Benign essential hypertension 05/27/2015   Bilateral carotid artery stenosis 02/24/2015   Atherosclerotic peripheral vascular disease ([place]) 09/07/2014   Bladder calculus 08/12/2014   Primary osteoarthritis of left knee 03/10/2014   Elevated prostate specific antigen (PSA) 02/05/2013   Snoring 01/22/2013   Sleepiness 01/22/2013   Dysphagia, unspecified(787.20) 01/22/2013   Occlusion and stenosis of carotid artery without mention of cerebral infarction 01/22/2013   Cerebral thrombosis with cerebral infarction ([place]) 01/22/2013   Benign prostatic hyperplasia with incomplete bladder emptying 08/26/2012   Chronic prostatitis 08/26/2012   Family history of malignant neoplasm of prostate 08/26/2012   Incomplete emptying of bladder 08/26/2012   Dysphagia 05/14/2012   Global aphasia 05/14/2012   Stroke, acute, thrombotic ([place]) 05/12/2012   Symptomatic carotid artery stenosis 05/12/2012   HTN (hypertension) 05/12/2012   COPD (chronic obstructive pulmonary disease) ([place]) 05/12/2012   Acute respiratory failure ([place]) 05/12/2012     Lou Miner, Student Intern 02/09/2020, 2:16 PM  [place] [address] [place], [place], [address] Phone: [phone]   Fax:  [phone]   Name: ANTONIA CULBERTSON MRN: [id number] Date of Birth: 1946/01/06

## 2020-02-12 ENCOUNTER — Ambulatory Visit: Payer: Medicare HMO | Admitting: Speech Pathology

## 2020-02-12 ENCOUNTER — Other Ambulatory Visit: Payer: Self-pay

## 2020-02-12 ENCOUNTER — Encounter: Payer: Self-pay | Admitting: Speech Pathology

## 2020-02-12 DIAGNOSIS — R41841 Cognitive communication deficit: Secondary | ICD-10-CM

## 2020-02-12 NOTE — Therapy (Signed)
[place] MAIN [facility] SERVICES [address] [place], [place], [address] Phone: [phone]   Fax:  [phone]  Speech Language Pathology Treatment  Patient Details  Name: Paul Bass MRN: [id number] Date of Birth: [birth year] Referring Provider (SLP): Dr. Manuella Ghazi   Encounter Date: 02/12/2020   End of Session - 02/12/20 1000    Visit Number 17    Number of Visits 32    Date for SLP Re-Evaluation 04/09/20    Authorization Type Medicare    Authorization Time Period Start 01/22/2020    Authorization - Visit Number 7    Authorization - Number of Visits 10    Progress Note Due on Visit 10    SLP Start Time 0855    SLP Stop Time  0940    SLP Time Calculation (min) 45 min    Activity Tolerance Patient tolerated treatment well           Past Medical History:  Diagnosis Date  . Anxiety   . BPH (benign prostatic hypertrophy)   . CAD S/P percutaneous coronary angioplasty   . COPD (chronic obstructive pulmonary disease) ([place])   . Depression   . Diabetes mellitus without complication ([place])   . Dyspnea   . GERD (gastroesophageal reflux disease)   . HTN (hypertension)   . Hyperlipidemia   . Hypothyroidism   . Stroke ([place])   . Tobacco abuse     Past Surgical History:  Procedure Laterality Date  . CARDIAC CATHETERIZATION N/A 12/07/2015   Procedure: Left Heart Cath and Coronary Angiography;  Surgeon: Corey Skains, MD;  Location: [place] [facility];  Service: Cardiovascular;  Laterality: N/A;  . COLONOSCOPY WITH PROPOFOL N/A 02/18/2019   Procedure: COLONOSCOPY WITH PROPOFOL;  Surgeon: Lucilla Lame, MD;  Location: [facility] ENDOSCOPY;  Service: Endoscopy;  Laterality: N/A;  . 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 CORONARY ARTERY BYPASS GRAFT    . CYSTOSCOPY WITH INSERTION OF UROLIFT N/A 04/29/2019   Procedure: CYSTOSCOPY WITH INSERTION OF UROLIFT;  Surgeon: Abbie Sons, MD;  Location: [facility] ORS;   Service: Urology;  Laterality: N/A;  . ESOPHAGOGASTRODUODENOSCOPY (EGD) WITH PROPOFOL N/A 09/11/2016   Procedure: ESOPHAGOGASTRODUODENOSCOPY (EGD) WITH PROPOFOL;  Surgeon: Lucilla Lame, MD;  Location: [facility] ENDOSCOPY;  Service: Endoscopy;  Laterality: N/A;  . ESOPHAGOGASTRODUODENOSCOPY (EGD) WITH PROPOFOL N/A 10/17/2016   Procedure: ESOPHAGOGASTRODUODENOSCOPY (EGD) WITH PROPOFOL;  Surgeon: Lucilla Lame, MD;  Location: [facility] ENDOSCOPY;  Service: Endoscopy;  Laterality: N/A;  . ESOPHAGOGASTRODUODENOSCOPY (EGD) WITH PROPOFOL N/A 11/13/2017   Procedure: ESOPHAGOGASTRODUODENOSCOPY (EGD) WITH PROPOFOL;  Surgeon: Lucilla Lame, MD;  Location: [facility] ENDOSCOPY;  Service: Endoscopy;  Laterality: N/A;  . pins R lower leg    . rotator cuff replaced     right  . SAVORY DILATION N/A 10/17/2016   Procedure: SAVORY DILATION;  Surgeon: Lucilla Lame, MD;  Location: [facility] ENDOSCOPY;  Service: Endoscopy;  Laterality: N/A;    There were no vitals filed for this visit.   Subjective Assessment - 02/12/20 1000    Subjective Focused, attentive                 ADULT SLP TREATMENT - 02/12/20 0001      General Information   Behavior/Cognition Alert;Cooperative;Pleasant mood    HPI  Paul Bass is 74 year old man, S/P CVA in 2013, referred for speech therapy for cognitive deficit post stroke.       Treatment Provided   Treatment  provided Cognitive-Linquistic      Pain Assessment   Pain Assessment No/denies pain      Cognitive-Linquistic Treatment   Treatment focused on Aphasia    Skilled Treatment MEMORY: Remembered names of people given visual stimulus at 100% accuracy. Remembered items in a word list (f=10) at 60% accuracy with min to moderate cueing. EXPRESSION: Came up with sentences using target word. Wrote out sentences at 75% legibility. Named items in a category at 70% accuracy.      Assessment / Recommendations / Plan   Plan Continue with current plan of care      Progression Toward Goals    Progression toward goals Progressing toward goals            SLP Education - 02/12/20 1000    Education Details Memory strategies    Person(s) Educated Patient    Methods Explanation    Comprehension Verbalized understanding              SLP Long Term Goals - 02/09/20 1414      SLP LONG TERM GOAL #1   Title Patient will generate grammatical, fluent, and cogent sentences to complete abstract/complex linguistic task with 80% accuracy.    Time 8    Period Weeks    Status Partially Met    Target Date 04/09/20      SLP LONG TERM GOAL #2   Title Patient will complete complex memory and high level wording activities with 80% accuracy.    Time 8    Period Weeks    Status Partially Met    Target Date 04/09/20      SLP LONG TERM GOAL #3   Title Patient will write grammatical and cogent phrases to complete simple/concrete linguistic task with 80% accuracy.    Time 8    Period Weeks    Status Partially Met    Target Date 04/09/20            Plan - 02/12/20 1001    Clinical Impression Statement Patient has successfully used self-cuing strategies to remember names of people. Will continue to work on memory with different tasks. Patient showed improvement in naming items in a category. He has more difficulty with naming the specific category.    Speech Therapy Frequency 2x / week    Duration Other (comment)    Treatment/Interventions Cognitive reorganization;Compensatory strategies;Language facilitation    Potential to Achieve Goals Good    Potential Considerations Ability to learn/carryover information;Pain level;Family/community support;Co-morbidities;Previous level of function;Cooperation/participation level;Severity of impairments           Patient will benefit from skilled therapeutic intervention in order to improve the following deficits and impairments:   Cognitive communication deficit    Problem List Patient Active Problem List   Diagnosis Date Noted  .  Abnormal ECG 04/21/2019  . Encounter for screening colonoscopy   . Polyp of colon   . Atherosclerotic heart disease of native coronary artery without angina pectoris 01/14/2019  . Depression 01/14/2019  . GERD (gastroesophageal reflux disease) 01/14/2019  . Periodic limb movement disorder (PLMD) 01/14/2019  . Sleep apnea 01/14/2019  . Type 2 diabetes mellitus without complications ([place]) 53/64/6803  . Stricture and stenosis of esophagus   . Problems with swallowing and mastication   . Food impaction of esophagus   . H/O coronary artery bypass surgery 01/17/2016  . Cerebrovascular accident (CVA) due to embolism ([place]) 01/03/2016  . Hypothyroidism, unspecified 01/03/2016  . HLD (hyperlipidemia) 01/03/2016  . Stable angina ([name])  11/29/2015  . Benign essential hypertension 05/27/2015  . Bilateral carotid artery stenosis 02/24/2015  . Atherosclerotic peripheral vascular disease ([place]) 09/07/2014  . Bladder calculus 08/12/2014  . Primary osteoarthritis of left knee 03/10/2014  . Elevated prostate specific antigen (PSA) 02/05/2013  . Snoring 01/22/2013  . Sleepiness 01/22/2013  . Dysphagia, unspecified(787.20) 01/22/2013  . Occlusion and stenosis of carotid artery without mention of cerebral infarction 01/22/2013  . Cerebral thrombosis with cerebral infarction ([place]) 01/22/2013  . Benign prostatic hyperplasia with incomplete bladder emptying 08/26/2012  . Chronic prostatitis 08/26/2012  . Family history of malignant neoplasm of prostate 08/26/2012  . Incomplete emptying of bladder 08/26/2012  . Dysphagia 05/14/2012  . Global aphasia 05/14/2012  . Stroke, acute, thrombotic ([place]) 05/12/2012  . Symptomatic carotid artery stenosis 05/12/2012  . HTN (hypertension) 05/12/2012  . COPD (chronic obstructive pulmonary disease) ([place]) 05/12/2012  . Acute respiratory failure ([place]) 05/12/2012    Maylon Cos, Student Intern 02/12/2020, 10:01 AM  [place] MAIN  [facility] SERVICES [address] [place], [place], [address] Phone: [phone]   Fax:  [phone]   Name: Paul Bass MRN: [id number] Date of Birth: 09/01/1945

## 2020-02-16 ENCOUNTER — Ambulatory Visit: Payer: Medicare HMO | Admitting: Speech Pathology

## 2020-02-16 ENCOUNTER — Encounter: Payer: Self-pay | Admitting: Speech Pathology

## 2020-02-16 ENCOUNTER — Other Ambulatory Visit: Payer: Self-pay

## 2020-02-16 DIAGNOSIS — R41841 Cognitive communication deficit: Secondary | ICD-10-CM

## 2020-02-16 NOTE — Therapy (Signed)
[place] MAIN [facility] SERVICES [address] [place], [place], [address] Phone: [phone]   Fax:  [phone]  Speech Language Pathology Treatment  Patient Details  Name: Paul Bass MRN: [id number] Date of Birth: 30-May-1946 Referring Provider (SLP): Dr. Manuella Ghazi   Encounter Date: 02/16/2020   End of Session - 02/16/20 1233    Visit Number 18    Number of Visits 32    Date for SLP Re-Evaluation 04/09/20    Authorization Type Medicare    Authorization Time Period Start 01/22/2020    Authorization - Visit Number 8    Authorization - Number of Visits 10    Progress Note Due on Visit 10    SLP Start Time 0845    SLP Stop Time  0930    SLP Time Calculation (min) 45 min    Activity Tolerance Patient tolerated treatment well           Past Medical History:  Diagnosis Date  . Anxiety   . BPH (benign prostatic hypertrophy)   . CAD S/P percutaneous coronary angioplasty   . COPD (chronic obstructive pulmonary disease) ([place])   . Depression   . Diabetes mellitus without complication ([place])   . Dyspnea   . GERD (gastroesophageal reflux disease)   . HTN (hypertension)   . Hyperlipidemia   . Hypothyroidism   . Stroke ([place])   . Tobacco abuse     Past Surgical History:  Procedure Laterality Date  . CARDIAC CATHETERIZATION N/A 12/07/2015   Procedure: Left Heart Cath and Coronary Angiography;  Surgeon: Corey Skains, MD;  Location: [place] [facility];  Service: Cardiovascular;  Laterality: N/A;  . COLONOSCOPY WITH PROPOFOL N/A 02/18/2019   Procedure: COLONOSCOPY WITH PROPOFOL;  Surgeon: Lucilla Lame, MD;  Location: [facility] ENDOSCOPY;  Service: Endoscopy;  Laterality: N/A;  . 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 CORONARY ARTERY BYPASS GRAFT    . CYSTOSCOPY WITH INSERTION OF UROLIFT N/A 04/29/2019   Procedure: CYSTOSCOPY WITH INSERTION OF UROLIFT;  Surgeon: Abbie Sons, MD;  Location: [facility] ORS;   Service: Urology;  Laterality: N/A;  . ESOPHAGOGASTRODUODENOSCOPY (EGD) WITH PROPOFOL N/A 09/11/2016   Procedure: ESOPHAGOGASTRODUODENOSCOPY (EGD) WITH PROPOFOL;  Surgeon: Lucilla Lame, MD;  Location: [facility] ENDOSCOPY;  Service: Endoscopy;  Laterality: N/A;  . ESOPHAGOGASTRODUODENOSCOPY (EGD) WITH PROPOFOL N/A 10/17/2016   Procedure: ESOPHAGOGASTRODUODENOSCOPY (EGD) WITH PROPOFOL;  Surgeon: Lucilla Lame, MD;  Location: [facility] ENDOSCOPY;  Service: Endoscopy;  Laterality: N/A;  . ESOPHAGOGASTRODUODENOSCOPY (EGD) WITH PROPOFOL N/A 11/13/2017   Procedure: ESOPHAGOGASTRODUODENOSCOPY (EGD) WITH PROPOFOL;  Surgeon: Lucilla Lame, MD;  Location: [facility] ENDOSCOPY;  Service: Endoscopy;  Laterality: N/A;  . pins R lower leg    . rotator cuff replaced     right  . SAVORY DILATION N/A 10/17/2016   Procedure: SAVORY DILATION;  Surgeon: Lucilla Lame, MD;  Location: [facility] ENDOSCOPY;  Service: Endoscopy;  Laterality: N/A;    There were no vitals filed for this visit.   Subjective Assessment - 02/16/20 1232    Subjective Attentive, willing to participate                 ADULT SLP TREATMENT - 02/16/20 0001      General Information   Behavior/Cognition Alert;Cooperative;Pleasant mood    HPI  Paul Bass is 74 year old man, S/P CVA in 2013, referred for speech therapy for cognitive deficit post stroke.       Treatment Provided  Treatment provided Cognitive-Linquistic      Pain Assessment   Pain Assessment No/denies pain      Cognitive-Linquistic Treatment   Treatment focused on Aphasia    Skilled Treatment MEMORY: Remembered 11/11 names of people in pictures using self-cuing strategies. Remembered a list of 10 words at 60% accuracy given verbal cues. EXPRESSION: Came up with words given definitions at 70% accuracy independently. Improved to 85% when given semantic cues.      Assessment / Recommendations / Plan   Plan Continue with current plan of care      Progression Toward Goals   Progression toward  goals Progressing toward goals            SLP Education - 02/16/20 1233    Education Details Word retrieval strategies    Person(s) Educated Patient    Methods Explanation    Comprehension Verbalized understanding              SLP Long Term Goals - 02/09/20 1414      SLP LONG TERM GOAL #1   Title Patient will generate grammatical, fluent, and cogent sentences to complete abstract/complex linguistic task with 80% accuracy.    Time 8    Period Weeks    Status Partially Met    Target Date 04/09/20      SLP LONG TERM GOAL #2   Title Patient will complete complex memory and high level wording activities with 80% accuracy.    Time 8    Period Weeks    Status Partially Met    Target Date 04/09/20      SLP LONG TERM GOAL #3   Title Patient will write grammatical and cogent phrases to complete simple/concrete linguistic task with 80% accuracy.    Time 8    Period Weeks    Status Partially Met    Target Date 04/09/20            Plan - 02/16/20 1233    Clinical Impression Statement Patient has mastered remembering familiar names. Will continue to introduce new memory tasks. Patient is continuing to work on Press photographer. He is improving in his ability to describe properties of objects.    Speech Therapy Frequency 2x / week    Duration Other (comment)    Treatment/Interventions Cognitive reorganization;Compensatory strategies;Language facilitation    Potential to Achieve Goals Good    Potential Considerations Ability to learn/carryover information;Pain level;Family/community support;Co-morbidities;Previous level of function;Cooperation/participation level;Severity of impairments           Patient will benefit from skilled therapeutic intervention in order to improve the following deficits and impairments:   Cognitive communication deficit    Problem List Patient Active Problem List   Diagnosis Date Noted  . Abnormal ECG 04/21/2019  . Encounter for  screening colonoscopy   . Polyp of colon   . Atherosclerotic heart disease of native coronary artery without angina pectoris 01/14/2019  . Depression 01/14/2019  . GERD (gastroesophageal reflux disease) 01/14/2019  . Periodic limb movement disorder (PLMD) 01/14/2019  . Sleep apnea 01/14/2019  . Type 2 diabetes mellitus without complications ([employer] [employer]) 59/16/3846  . Stricture and stenosis of esophagus   . Problems with swallowing and mastication   . Food impaction of esophagus   . H/O coronary artery bypass surgery 01/17/2016  . Cerebrovascular accident (CVA) due to embolism ([place]) 01/03/2016  . Hypothyroidism, unspecified 01/03/2016  . HLD (hyperlipidemia) 01/03/2016  . Stable angina ([place]) 11/29/2015  . Benign essential hypertension 05/27/2015  . Bilateral  carotid artery stenosis 02/24/2015  . Atherosclerotic peripheral vascular disease ([place]) 09/07/2014  . Bladder calculus 08/12/2014  . Primary osteoarthritis of left knee 03/10/2014  . Elevated prostate specific antigen (PSA) 02/05/2013  . Snoring 01/22/2013  . Sleepiness 01/22/2013  . Dysphagia, unspecified(787.20) 01/22/2013  . Occlusion and stenosis of carotid artery without mention of cerebral infarction 01/22/2013  . Cerebral thrombosis with cerebral infarction ([place]) 01/22/2013  . Benign prostatic hyperplasia with incomplete bladder emptying 08/26/2012  . Chronic prostatitis 08/26/2012  . Family history of malignant neoplasm of prostate 08/26/2012  . Incomplete emptying of bladder 08/26/2012  . Dysphagia 05/14/2012  . Global aphasia 05/14/2012  . Stroke, acute, thrombotic ([place]) 05/12/2012  . Symptomatic carotid artery stenosis 05/12/2012  . HTN (hypertension) 05/12/2012  . COPD (chronic obstructive pulmonary disease) ([place]) 05/12/2012  . Acute respiratory failure ([place]) 05/12/2012    Maylon Cos, Student Intern 02/16/2020, 12:34 PM  [place] MAIN [facility] SERVICES [address]  [place], [place], [address] Phone: [phone]   Fax:  [phone]   Name: Paul Bass MRN: [id number] Date of Birth: 03-13-1946

## 2020-02-18 ENCOUNTER — Encounter: Payer: Self-pay | Admitting: Speech Pathology

## 2020-02-18 ENCOUNTER — Other Ambulatory Visit: Payer: Self-pay

## 2020-02-18 ENCOUNTER — Ambulatory Visit: Payer: Medicare HMO | Admitting: Speech Pathology

## 2020-02-18 DIAGNOSIS — R41841 Cognitive communication deficit: Secondary | ICD-10-CM

## 2020-02-18 NOTE — Therapy (Signed)
[place] MAIN [facility] SERVICES [address] [place], [place], [address] Phone: [phone]   Fax:  [phone]  Speech Language Pathology Treatment  Patient Details  Name: Paul Bass MRN: [id number] Date of Birth: [birth year] Referring Provider (SLP): Dr. Manuella Ghazi   Encounter Date: 02/18/2020   End of Session - 02/18/20 0951    Visit Number 19    Number of Visits 32    Date for SLP Re-Evaluation 04/09/20    Authorization Type Medicare    Authorization Time Period Start 01/22/2020    Authorization - Visit Number 9    Authorization - Number of Visits 10    Progress Note Due on Visit 10    SLP Start Time 0855    SLP Stop Time  0945    SLP Time Calculation (min) 50 min    Activity Tolerance Patient tolerated treatment well           Past Medical History:  Diagnosis Date  . Anxiety   . BPH (benign prostatic hypertrophy)   . CAD S/P percutaneous coronary angioplasty   . COPD (chronic obstructive pulmonary disease) ([place])   . Depression   . Diabetes mellitus without complication ([place])   . Dyspnea   . GERD (gastroesophageal reflux disease)   . HTN (hypertension)   . Hyperlipidemia   . Hypothyroidism   . Stroke ([place])   . Tobacco abuse     Past Surgical History:  Procedure Laterality Date  . CARDIAC CATHETERIZATION N/A 12/07/2015   Procedure: Left Heart Cath and Coronary Angiography;  Surgeon: Corey Skains, MD;  Location: [place] CV LAB;  Service: Cardiovascular;  Laterality: N/A;  . COLONOSCOPY WITH PROPOFOL N/A 02/18/2019   Procedure: COLONOSCOPY WITH PROPOFOL;  Surgeon: Lucilla Lame, MD;  Location: [facility] ENDOSCOPY;  Service: Endoscopy;  Laterality: N/A;  . 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 CORONARY ARTERY BYPASS GRAFT    . CYSTOSCOPY WITH INSERTION OF UROLIFT N/A 04/29/2019   Procedure: CYSTOSCOPY WITH INSERTION OF UROLIFT;  Surgeon: Abbie Sons, MD;  Location: [facility] ORS;   Service: Urology;  Laterality: N/A;  . ESOPHAGOGASTRODUODENOSCOPY (EGD) WITH PROPOFOL N/A 09/11/2016   Procedure: ESOPHAGOGASTRODUODENOSCOPY (EGD) WITH PROPOFOL;  Surgeon: Lucilla Lame, MD;  Location: [facility] ENDOSCOPY;  Service: Endoscopy;  Laterality: N/A;  . ESOPHAGOGASTRODUODENOSCOPY (EGD) WITH PROPOFOL N/A 10/17/2016   Procedure: ESOPHAGOGASTRODUODENOSCOPY (EGD) WITH PROPOFOL;  Surgeon: Lucilla Lame, MD;  Location: [facility] ENDOSCOPY;  Service: Endoscopy;  Laterality: N/A;  . ESOPHAGOGASTRODUODENOSCOPY (EGD) WITH PROPOFOL N/A 11/13/2017   Procedure: ESOPHAGOGASTRODUODENOSCOPY (EGD) WITH PROPOFOL;  Surgeon: Lucilla Lame, MD;  Location: [facility] ENDOSCOPY;  Service: Endoscopy;  Laterality: N/A;  . pins R lower leg    . rotator cuff replaced     right  . SAVORY DILATION N/A 10/17/2016   Procedure: SAVORY DILATION;  Surgeon: Lucilla Lame, MD;  Location: [facility] ENDOSCOPY;  Service: Endoscopy;  Laterality: N/A;    There were no vitals filed for this visit.   Subjective Assessment - 02/18/20 0951    Subjective "I think my spelling is getting a little better"                 ADULT SLP TREATMENT - 02/18/20 0001      General Information   Behavior/Cognition Alert;Cooperative;Pleasant mood    HPI  Paul Bass is 74 year old man, S/P CVA in 2013, referred for speech therapy for cognitive deficit post stroke.  Treatment Provided   Treatment provided Cognitive-Linquistic      Pain Assessment   Pain Assessment No/denies pain      Cognitive-Linquistic Treatment   Treatment focused on Aphasia    Skilled Treatment EXPRESSION: Completed sentences by filling in an appropriate word at 74% accuracy independently. Wrote legibly 75% of the time. Listed three items in a category at 90% accuracy. MEMORY: Remembered 10/11 names of people in pictures. Given a word list (f=10) and verbal cues, remembered 50% of words.      Assessment / Recommendations / Plan   Plan Continue with current plan of care       Progression Toward Goals   Progression toward goals Progressing toward goals            SLP Education - 02/18/20 0951    Education Details Memory strategies    Person(s) Educated Patient    Methods Explanation    Comprehension Verbalized understanding              SLP Long Term Goals - 02/09/20 1414      SLP LONG TERM GOAL #1   Title Patient will generate grammatical, fluent, and cogent sentences to complete abstract/complex linguistic task with 80% accuracy.    Time 8    Period Weeks    Status Partially Met    Target Date 04/09/20      SLP LONG TERM GOAL #2   Title Patient will complete complex memory and high level wording activities with 80% accuracy.    Time 8    Period Weeks    Status Partially Met    Target Date 04/09/20      SLP LONG TERM GOAL #3   Title Patient will write grammatical and cogent phrases to complete simple/concrete linguistic task with 80% accuracy.    Time 8    Period Weeks    Status Partially Met    Target Date 04/09/20            Plan - 02/18/20 4132    Clinical Impression Statement Patient is showing improvement in word finding abilities and in understanding the concept of categories. He requires reminders to write large enough to be legible. Patient is also improving in his ability to spell words on his own.    Speech Therapy Frequency 2x / week    Duration Other (comment)    Treatment/Interventions Cognitive reorganization;Compensatory strategies;Language facilitation    Potential to Achieve Goals Good    Potential Considerations Ability to learn/carryover information;Pain level;Family/community support;Co-morbidities;Previous level of function;Cooperation/participation level;Severity of impairments           Patient will benefit from skilled therapeutic intervention in order to improve the following deficits and impairments:   Cognitive communication deficit    Problem List Patient Active Problem List   Diagnosis Date  Noted  . Abnormal ECG 04/21/2019  . Encounter for screening colonoscopy   . Polyp of colon   . Atherosclerotic heart disease of native coronary artery without angina pectoris 01/14/2019  . Depression 01/14/2019  . GERD (gastroesophageal reflux disease) 01/14/2019  . Periodic limb movement disorder (PLMD) 01/14/2019  . Sleep apnea 01/14/2019  . Type 2 diabetes mellitus without complications ([place]) 44/07/270  . Stricture and stenosis of esophagus   . Problems with swallowing and mastication   . Food impaction of esophagus   . H/O coronary artery bypass surgery 01/17/2016  . Cerebrovascular accident (CVA) due to embolism ([place]) 01/03/2016  . Hypothyroidism, unspecified 01/03/2016  . HLD (hyperlipidemia) 01/03/2016  .  Stable angina ([place]) 11/29/2015  . Benign essential hypertension 05/27/2015  . Bilateral carotid artery stenosis 02/24/2015  . Atherosclerotic peripheral vascular disease ([place]) 09/07/2014  . Bladder calculus 08/12/2014  . Primary osteoarthritis of left knee 03/10/2014  . Elevated prostate specific antigen (PSA) 02/05/2013  . Snoring 01/22/2013  . Sleepiness 01/22/2013  . Dysphagia, unspecified(787.20) 01/22/2013  . Occlusion and stenosis of carotid artery without mention of cerebral infarction 01/22/2013  . Cerebral thrombosis with cerebral infarction ([place]) 01/22/2013  . Benign prostatic hyperplasia with incomplete bladder emptying 08/26/2012  . Chronic prostatitis 08/26/2012  . Family history of malignant neoplasm of prostate 08/26/2012  . Incomplete emptying of bladder 08/26/2012  . Dysphagia 05/14/2012  . Global aphasia 05/14/2012  . Stroke, acute, thrombotic ([place]) 05/12/2012  . Symptomatic carotid artery stenosis 05/12/2012  . HTN (hypertension) 05/12/2012  . COPD (chronic obstructive pulmonary disease) ([place]) 05/12/2012  . Acute respiratory failure ([place]) 05/12/2012    Maylon Cos, Student Intern 02/18/2020, 9:52 AM  [place] MAIN [facility] SERVICES [address] [place], [place], [address] Phone: [phone]   Fax:  [phone]   Name: Paul Bass MRN: [id number] Date of Birth: [birth year]

## 2020-02-23 ENCOUNTER — Other Ambulatory Visit: Payer: Self-pay

## 2020-02-23 ENCOUNTER — Ambulatory Visit: Payer: Medicare HMO | Admitting: Speech Pathology

## 2020-02-23 ENCOUNTER — Encounter: Payer: Self-pay | Admitting: Speech Pathology

## 2020-02-23 DIAGNOSIS — R41841 Cognitive communication deficit: Secondary | ICD-10-CM

## 2020-02-23 NOTE — Therapy (Signed)
[place] MAIN [facility] SERVICES [address] [place], [place], [address] Phone: [phone]   Fax:  [phone]  Speech Language Pathology Treatment/Progress Note  Speech Therapy Progress Note   Dates of reporting period  01/22/2020   to   02/23/2020   Patient Details  Name: Paul Bass MRN: [id number] Date of Birth: [birth year] Referring Provider (SLP): Dr. Manuella Ghazi   Encounter Date: 02/23/2020   End of Session - 02/23/20 1231    Visit Number 20    Number of Visits 32    Date for SLP Re-Evaluation 04/09/20    Authorization Type Medicare    Authorization Time Period Start 01/22/2020    Authorization - Visit Number 10    Authorization - Number of Visits 10    Progress Note Due on Visit 10    SLP Start Time 0955    SLP Stop Time  1040    SLP Time Calculation (min) 45 min    Activity Tolerance Patient tolerated treatment well           Past Medical History:  Diagnosis Date  . Anxiety   . BPH (benign prostatic hypertrophy)   . CAD S/P percutaneous coronary angioplasty   . COPD (chronic obstructive pulmonary disease) ([place])   . Depression   . Diabetes mellitus without complication ([place])   . Dyspnea   . GERD (gastroesophageal reflux disease)   . HTN (hypertension)   . Hyperlipidemia   . Hypothyroidism   . Stroke ([place])   . Tobacco abuse     Past Surgical History:  Procedure Laterality Date  . CARDIAC CATHETERIZATION N/A 12/07/2015   Procedure: Left Heart Cath and Coronary Angiography;  Surgeon: Corey Skains, MD;  Location: [place] CV LAB;  Service: Cardiovascular;  Laterality: N/A;  . COLONOSCOPY WITH PROPOFOL N/A 02/18/2019   Procedure: COLONOSCOPY WITH PROPOFOL;  Surgeon: Lucilla Lame, MD;  Location: [facility] ENDOSCOPY;  Service: Endoscopy;  Laterality: N/A;  . 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 CORONARY ARTERY BYPASS GRAFT    . CYSTOSCOPY WITH INSERTION OF UROLIFT N/A 04/29/2019    Procedure: CYSTOSCOPY WITH INSERTION OF UROLIFT;  Surgeon: Abbie Sons, MD;  Location: [facility] ORS;  Service: Urology;  Laterality: N/A;  . ESOPHAGOGASTRODUODENOSCOPY (EGD) WITH PROPOFOL N/A 09/11/2016   Procedure: ESOPHAGOGASTRODUODENOSCOPY (EGD) WITH PROPOFOL;  Surgeon: Lucilla Lame, MD;  Location: [facility] ENDOSCOPY;  Service: Endoscopy;  Laterality: N/A;  . ESOPHAGOGASTRODUODENOSCOPY (EGD) WITH PROPOFOL N/A 10/17/2016   Procedure: ESOPHAGOGASTRODUODENOSCOPY (EGD) WITH PROPOFOL;  Surgeon: Lucilla Lame, MD;  Location: [facility] ENDOSCOPY;  Service: Endoscopy;  Laterality: N/A;  . ESOPHAGOGASTRODUODENOSCOPY (EGD) WITH PROPOFOL N/A 11/13/2017   Procedure: ESOPHAGOGASTRODUODENOSCOPY (EGD) WITH PROPOFOL;  Surgeon: Lucilla Lame, MD;  Location: [facility] ENDOSCOPY;  Service: Endoscopy;  Laterality: N/A;  . pins R lower leg    . rotator cuff replaced     right  . SAVORY DILATION N/A 10/17/2016   Procedure: SAVORY DILATION;  Surgeon: Lucilla Lame, MD;  Location: [facility] ENDOSCOPY;  Service: Endoscopy;  Laterality: N/A;    There were no vitals filed for this visit.   Subjective Assessment - 02/23/20 1231    Subjective "I didn't know a stroke would make things this confusing"                 ADULT SLP TREATMENT - 02/23/20 0001      General Information   Behavior/Cognition Alert;Cooperative;Pleasant mood    HPI  Paul Bass is 74 year old man, S/P CVA in 2013, referred for speech therapy for cognitive deficit post stroke.       Treatment Provided   Treatment provided Cognitive-Linquistic      Pain Assessment   Pain Assessment No/denies pain      Cognitive-Linquistic Treatment   Treatment focused on Aphasia    Skilled Treatment EXPRESSION: Rearranged words to form a coherent sentence with mid to moderate cueing. Wrote out the sentence legibly in 75% of opportunities. Described common objects in pictures using word retrieval strategies at 75% accuracy. MEMORY: Remembered 10/11 names of people in pictures.       Assessment / Recommendations / Plan   Plan Continue with current plan of care      Progression Toward Goals   Progression toward goals Progressing toward goals            SLP Education - 02/23/20 1231    Education Details Word retrieval strategies    Person(s) Educated Patient    Methods Explanation    Comprehension Verbalized understanding              SLP Long Term Goals - 02/09/20 1414      SLP LONG TERM GOAL #1   Title Patient will generate grammatical, fluent, and cogent sentences to complete abstract/complex linguistic task with 80% accuracy.    Time 8    Period Weeks    Status Partially Met    Target Date 04/09/20      SLP LONG TERM GOAL #2   Title Patient will complete complex memory and high level wording activities with 80% accuracy.    Time 8    Period Weeks    Status Partially Met    Target Date 04/09/20      SLP LONG TERM GOAL #3   Title Patient will write grammatical and cogent phrases to complete simple/concrete linguistic task with 80% accuracy.    Time 8    Period Weeks    Status Partially Met    Target Date 04/09/20            Plan - 02/23/20 1232    Clinical Impression Statement Patient had some difficulty with unscrambling words, but did well with moderate cueing. His handwriting is usually legible, but he requires cueing to write big enough. Will continue to work on word retrieval strategies and describing objects.    Speech Therapy Frequency 2x / week    Duration Other (comment)    Treatment/Interventions Cognitive reorganization;Compensatory strategies;Language facilitation    Potential to Achieve Goals Good    Potential Considerations Ability to learn/carryover information;Pain level;Family/community support;Co-morbidities;Previous level of function;Cooperation/participation level;Severity of impairments           Patient will benefit from skilled therapeutic intervention in order to improve the following deficits and  impairments:   Cognitive communication deficit    Problem List Patient Active Problem List   Diagnosis Date Noted  . Abnormal ECG 04/21/2019  . Encounter for screening colonoscopy   . Polyp of colon   . Atherosclerotic heart disease of native coronary artery without angina pectoris 01/14/2019  . Depression 01/14/2019  . GERD (gastroesophageal reflux disease) 01/14/2019  . Periodic limb movement disorder (PLMD) 01/14/2019  . Sleep apnea 01/14/2019  . Type 2 diabetes mellitus without complications ([place]) 05/27/2535  . Stricture and stenosis of esophagus   . Problems with swallowing and mastication   . Food impaction of esophagus   . H/O coronary artery bypass surgery 01/17/2016  .  Cerebrovascular accident (CVA) due to embolism ([place]) 01/03/2016  . Hypothyroidism, unspecified 01/03/2016  . HLD (hyperlipidemia) 01/03/2016  . Stable angina ([place]) 11/29/2015  . Benign essential hypertension 05/27/2015  . Bilateral carotid artery stenosis 02/24/2015  . Atherosclerotic peripheral vascular disease ([place]) 09/07/2014  . Bladder calculus 08/12/2014  . Primary osteoarthritis of left knee 03/10/2014  . Elevated prostate specific antigen (PSA) 02/05/2013  . Snoring 01/22/2013  . Sleepiness 01/22/2013  . Dysphagia, unspecified(787.20) 01/22/2013  . Occlusion and stenosis of carotid artery without mention of cerebral infarction 01/22/2013  . Cerebral thrombosis with cerebral infarction ([place]) 01/22/2013  . Benign prostatic hyperplasia with incomplete bladder emptying 08/26/2012  . Chronic prostatitis 08/26/2012  . Family history of malignant neoplasm of prostate 08/26/2012  . Incomplete emptying of bladder 08/26/2012  . Dysphagia 05/14/2012  . Global aphasia 05/14/2012  . Stroke, acute, thrombotic ([place]) 05/12/2012  . Symptomatic carotid artery stenosis 05/12/2012  . HTN (hypertension) 05/12/2012  . COPD (chronic obstructive pulmonary disease) ([place]) 05/12/2012  . Acute respiratory failure  ([place]) 05/12/2012    Maylon Cos, Student Intern 02/23/2020, 12:32 PM  [place] MAIN [facility] SERVICES [address] [place], [place], [address] Phone: [phone]   Fax:  [phone]   Name: Paul Bass MRN: [id number] Date of Birth: 1946-02-04

## 2020-02-26 ENCOUNTER — Ambulatory Visit: Payer: Medicare HMO | Admitting: Speech Pathology

## 2020-03-01 ENCOUNTER — Ambulatory Visit: Payer: Medicare HMO | Attending: Neurology | Admitting: Speech Pathology

## 2020-03-01 ENCOUNTER — Other Ambulatory Visit: Payer: Self-pay

## 2020-03-01 ENCOUNTER — Encounter: Payer: Self-pay | Admitting: Speech Pathology

## 2020-03-01 DIAGNOSIS — R41841 Cognitive communication deficit: Secondary | ICD-10-CM | POA: Insufficient documentation

## 2020-03-01 NOTE — Therapy (Signed)
[place] MAIN [facility] SERVICES [address] [place], [place], [address] Phone: [phone]   Fax:  [phone]  Speech Language Pathology Treatment  Patient Details  Name: Paul Bass MRN: [id number] Date of Birth: 1946-03-14 Referring Provider (SLP): Dr. Manuella Ghazi   Encounter Date: 03/01/2020   End of Session - 03/01/20 0958    Visit Number 21    Number of Visits 32    Date for SLP Re-Evaluation 04/09/20    Authorization Type Medicare    Authorization Time Period Start 03/01/2020    Authorization - Visit Number 1    Progress Note Due on Visit 10    SLP Start Time 0900    SLP Stop Time  0947    SLP Time Calculation (min) 47 min    Activity Tolerance Patient tolerated treatment well           Past Medical History:  Diagnosis Date  . Anxiety   . BPH (benign prostatic hypertrophy)   . CAD S/P percutaneous coronary angioplasty   . COPD (chronic obstructive pulmonary disease) ([place])   . Depression   . Diabetes mellitus without complication ([place])   . Dyspnea   . GERD (gastroesophageal reflux disease)   . HTN (hypertension)   . Hyperlipidemia   . Hypothyroidism   . Stroke ([place])   . Tobacco abuse     Past Surgical History:  Procedure Laterality Date  . CARDIAC CATHETERIZATION N/A 12/07/2015   Procedure: Left Heart Cath and Coronary Angiography;  Surgeon: Corey Skains, MD;  Location: [place] CV LAB;  Service: Cardiovascular;  Laterality: N/A;  . COLONOSCOPY WITH PROPOFOL N/A 02/18/2019   Procedure: COLONOSCOPY WITH PROPOFOL;  Surgeon: Lucilla Lame, MD;  Location: [facility] ENDOSCOPY;  Service: Endoscopy;  Laterality: N/A;  . 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 CORONARY ARTERY BYPASS GRAFT    . CYSTOSCOPY WITH INSERTION OF UROLIFT N/A 04/29/2019   Procedure: CYSTOSCOPY WITH INSERTION OF UROLIFT;  Surgeon: Abbie Sons, MD;  Location: [facility] ORS;  Service: Urology;  Laterality: N/A;  .  ESOPHAGOGASTRODUODENOSCOPY (EGD) WITH PROPOFOL N/A 09/11/2016   Procedure: ESOPHAGOGASTRODUODENOSCOPY (EGD) WITH PROPOFOL;  Surgeon: Lucilla Lame, MD;  Location: [facility] ENDOSCOPY;  Service: Endoscopy;  Laterality: N/A;  . ESOPHAGOGASTRODUODENOSCOPY (EGD) WITH PROPOFOL N/A 10/17/2016   Procedure: ESOPHAGOGASTRODUODENOSCOPY (EGD) WITH PROPOFOL;  Surgeon: Lucilla Lame, MD;  Location: [facility] ENDOSCOPY;  Service: Endoscopy;  Laterality: N/A;  . ESOPHAGOGASTRODUODENOSCOPY (EGD) WITH PROPOFOL N/A 11/13/2017   Procedure: ESOPHAGOGASTRODUODENOSCOPY (EGD) WITH PROPOFOL;  Surgeon: Lucilla Lame, MD;  Location: [facility] ENDOSCOPY;  Service: Endoscopy;  Laterality: N/A;  . pins R lower leg    . rotator cuff replaced     right  . SAVORY DILATION N/A 10/17/2016   Procedure: SAVORY DILATION;  Surgeon: Lucilla Lame, MD;  Location: [facility] ENDOSCOPY;  Service: Endoscopy;  Laterality: N/A;    There were no vitals filed for this visit.   Subjective Assessment - 03/01/20 0957    Subjective "I didn't know a stroke would make things this confusing"                 ADULT SLP TREATMENT - 03/01/20 0001      General Information   Behavior/Cognition Alert;Cooperative;Pleasant mood    HPI  Paul Bass is 74 year old man, S/P CVA in 2013, referred for speech therapy for cognitive deficit post stroke.       Treatment Provided   Treatment provided  Cognitive-Linquistic      Pain Assessment   Pain Assessment No/denies pain      Cognitive-Linquistic Treatment   Treatment focused on Aphasia    Skilled Treatment WRITING: Write phrases with 80% legibility.  Patient has difficulty with spelling and wants to learn compensatory strategies for writing checks. VERBAL EXPRESSION:  Explain wrong category member with 70% accuracy for clear/cogent responses. REMEBERING NAMES: Recall 11/11 names (photos of people he has "met" over the past sessions) with delays and self-cuing. MEMORY/WORD FINDING: Recall 8/10-word list given repetition and  semantic cues.  Answer general information questions giving multiple answers with 80% accuracy.      Assessment / Recommendations / Plan   Plan Continue with current plan of care      Progression Toward Goals   Progression toward goals Progressing toward goals            SLP Education - 03/01/20 0957    Education Details Memory strategies    Person(s) Educated Patient    Methods Explanation    Comprehension Verbalized understanding              SLP Long Term Goals - 02/23/20 1343      SLP LONG TERM GOAL #1   Title Patient will generate grammatical, fluent, and cogent sentences to complete abstract/complex linguistic task with 80% accuracy.    Status Partially Met    Target Date 04/09/20      SLP LONG TERM GOAL #2   Title Patient will complete complex memory and high level wording activities with 80% accuracy.    Status Partially Met    Target Date 04/09/20      SLP LONG TERM GOAL #3   Title Patient will write grammatical and cogent phrases to complete simple/concrete linguistic task with 80% accuracy.    Status Partially Met    Target Date 04/09/20            Plan - 03/01/20 0959    Clinical Impression Statement The patient demonstrates improved use of meaningful content words in structured and unstructured verbal tasks. He is remembering names with fewer cues needed.  Handwriting has improved. Will focus on memory and check writing.    Speech Therapy Frequency 2x / week    Duration Other (comment)    Treatment/Interventions Cognitive reorganization;Compensatory strategies;Language facilitation    Potential to Achieve Goals Good    Potential Considerations Ability to learn/carryover information;Pain level;Family/community support;Co-morbidities;Previous level of function;Cooperation/participation level;Severity of impairments    Consulted and Agree with Plan of Care Patient           Patient will benefit from skilled therapeutic intervention in order to  improve the following deficits and impairments:   Cognitive communication deficit    Problem List Patient Active Problem List   Diagnosis Date Noted  . Abnormal ECG 04/21/2019  . Encounter for screening colonoscopy   . Polyp of colon   . Atherosclerotic heart disease of native coronary artery without angina pectoris 01/14/2019  . Depression 01/14/2019  . GERD (gastroesophageal reflux disease) 01/14/2019  . Periodic limb movement disorder (PLMD) 01/14/2019  . Sleep apnea 01/14/2019  . Type 2 diabetes mellitus without complications ([place]) 69/67/8938  . Stricture and stenosis of esophagus   . Problems with swallowing and mastication   . Food impaction of esophagus   . H/O coronary artery bypass surgery 01/17/2016  . Cerebrovascular accident (CVA) due to embolism ([place]) 01/03/2016  . Hypothyroidism, unspecified 01/03/2016  . HLD (hyperlipidemia) 01/03/2016  . Stable  angina ([place]) 11/29/2015  . Benign essential hypertension 05/27/2015  . Bilateral carotid artery stenosis 02/24/2015  . Atherosclerotic peripheral vascular disease ([place]) 09/07/2014  . Bladder calculus 08/12/2014  . Primary osteoarthritis of left knee 03/10/2014  . Elevated prostate specific antigen (PSA) 02/05/2013  . Snoring 01/22/2013  . Sleepiness 01/22/2013  . Dysphagia, unspecified(787.20) 01/22/2013  . Occlusion and stenosis of carotid artery without mention of cerebral infarction 01/22/2013  . Cerebral thrombosis with cerebral infarction ([place]) 01/22/2013  . Benign prostatic hyperplasia with incomplete bladder emptying 08/26/2012  . Chronic prostatitis 08/26/2012  . Family history of malignant neoplasm of prostate 08/26/2012  . Incomplete emptying of bladder 08/26/2012  . Dysphagia 05/14/2012  . Global aphasia 05/14/2012  . Stroke, acute, thrombotic ([place]) 05/12/2012  . Symptomatic carotid artery stenosis 05/12/2012  . HTN (hypertension) 05/12/2012  . COPD (chronic obstructive pulmonary disease) ([place]) 05/12/2012   . Acute respiratory failure ([place]) 05/12/2012   Leroy Sea, MS/CCC- SLP  Lou Miner 03/01/2020, 10:00 AM  [place] MAIN [facility] SERVICES [address] [place], [place], [address] Phone: [phone]   Fax:  [phone]   Name: MILLARD BAUTCH MRN: [id number] Date of Birth: 1945-09-18

## 2020-03-03 ENCOUNTER — Other Ambulatory Visit: Payer: Self-pay

## 2020-03-03 ENCOUNTER — Ambulatory Visit: Payer: Medicare HMO | Admitting: Speech Pathology

## 2020-03-03 DIAGNOSIS — R41841 Cognitive communication deficit: Secondary | ICD-10-CM

## 2020-03-04 ENCOUNTER — Encounter: Payer: Self-pay | Admitting: Speech Pathology

## 2020-03-04 NOTE — Therapy (Signed)
[place] MAIN [facility] SERVICES [address] [place], [place], [address] Phone: [phone]   Fax:  [phone]  Speech Language Pathology Treatment  Patient Details  Name: Paul Bass MRN: [id number] Date of Birth: 07-27-1946 Referring Provider (SLP): Dr. Manuella Ghazi   Encounter Date: 03/03/2020   End of Session - 03/04/20 0809    Visit Number 22    Number of Visits 32    Date for SLP Re-Evaluation 04/09/20    Authorization Type Medicare    Authorization Time Period Start 03/01/2020    Authorization - Visit Number 2    Progress Note Due on Visit 10    SLP Start Time 1100    SLP Stop Time  1150    SLP Time Calculation (min) 50 min    Activity Tolerance Patient tolerated treatment well           Past Medical History:  Diagnosis Date  . Anxiety   . BPH (benign prostatic hypertrophy)   . CAD S/P percutaneous coronary angioplasty   . COPD (chronic obstructive pulmonary disease) ([place])   . Depression   . Diabetes mellitus without complication ([place])   . Dyspnea   . GERD (gastroesophageal reflux disease)   . HTN (hypertension)   . Hyperlipidemia   . Hypothyroidism   . Stroke ([place])   . Tobacco abuse     Past Surgical History:  Procedure Laterality Date  . CARDIAC CATHETERIZATION N/A 12/07/2015   Procedure: Left Heart Cath and Coronary Angiography;  Surgeon: Corey Skains, MD;  Location: [place] CV LAB;  Service: Cardiovascular;  Laterality: N/A;  . COLONOSCOPY WITH PROPOFOL N/A 02/18/2019   Procedure: COLONOSCOPY WITH PROPOFOL;  Surgeon: Lucilla Lame, MD;  Location: [facility] ENDOSCOPY;  Service: Endoscopy;  Laterality: N/A;  . 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 CORONARY ARTERY BYPASS GRAFT    . CYSTOSCOPY WITH INSERTION OF UROLIFT N/A 04/29/2019   Procedure: CYSTOSCOPY WITH INSERTION OF UROLIFT;  Surgeon: Abbie Sons, MD;  Location: [facility] ORS;  Service: Urology;  Laterality: N/A;  .  ESOPHAGOGASTRODUODENOSCOPY (EGD) WITH PROPOFOL N/A 09/11/2016   Procedure: ESOPHAGOGASTRODUODENOSCOPY (EGD) WITH PROPOFOL;  Surgeon: Lucilla Lame, MD;  Location: [facility] ENDOSCOPY;  Service: Endoscopy;  Laterality: N/A;  . ESOPHAGOGASTRODUODENOSCOPY (EGD) WITH PROPOFOL N/A 10/17/2016   Procedure: ESOPHAGOGASTRODUODENOSCOPY (EGD) WITH PROPOFOL;  Surgeon: Lucilla Lame, MD;  Location: [facility] ENDOSCOPY;  Service: Endoscopy;  Laterality: N/A;  . ESOPHAGOGASTRODUODENOSCOPY (EGD) WITH PROPOFOL N/A 11/13/2017   Procedure: ESOPHAGOGASTRODUODENOSCOPY (EGD) WITH PROPOFOL;  Surgeon: Lucilla Lame, MD;  Location: [facility] ENDOSCOPY;  Service: Endoscopy;  Laterality: N/A;  . pins R lower leg    . rotator cuff replaced     right  . SAVORY DILATION N/A 10/17/2016   Procedure: SAVORY DILATION;  Surgeon: Lucilla Lame, MD;  Location: [facility] ENDOSCOPY;  Service: Endoscopy;  Laterality: N/A;    There were no vitals filed for this visit.   Subjective Assessment - 03/04/20 0809    Subjective "I didn't know a stroke would make things this confusing"                 ADULT SLP TREATMENT - 03/04/20 0001      General Information   Behavior/Cognition Alert;Cooperative;Pleasant mood    HPI  Paul Bass is 74 year old man, S/P CVA in 2013, referred for speech therapy for cognitive deficit post stroke.       Treatment Provided   Treatment provided  Cognitive-Linquistic      Pain Assessment   Pain Assessment No/denies pain      Cognitive-Linquistic Treatment   Treatment focused on Aphasia    Skilled Treatment WRITING: Write word form of money amounts, given spelling sheet for aid, with 80% legibility.  VERBAL EXPRESSION:  Maintain conversation with no lapse in cogency. REMEBERING NAMES: Recall 11/11 names (photos of people he has "met" over the past sessions) with delays and self-cuing. MEMORY/WORD FINDING: Recall 8/10-word list given repetition and semantic cues.  Answer math word problems with 90% accuracy.      Assessment  / Recommendations / Plan   Plan Continue with current plan of care      Progression Toward Goals   Progression toward goals Progressing toward goals            SLP Education - 03/04/20 0809    Education Details Memory strategies and compensatory strategies    Person(s) Educated Patient    Methods Explanation    Comprehension Verbalized understanding              SLP Long Term Goals - 02/23/20 1343      SLP LONG TERM GOAL #1   Title Patient will generate grammatical, fluent, and cogent sentences to complete abstract/complex linguistic task with 80% accuracy.    Status Partially Met    Target Date 04/09/20      SLP LONG TERM GOAL #2   Title Patient will complete complex memory and high level wording activities with 80% accuracy.    Status Partially Met    Target Date 04/09/20      SLP LONG TERM GOAL #3   Title Patient will write grammatical and cogent phrases to complete simple/concrete linguistic task with 80% accuracy.    Status Partially Met    Target Date 04/09/20            Plan - 03/04/20 0810    Clinical Impression Statement The patient demonstrates improved use of meaningful content words in structured and unstructured verbal tasks. He is remembering names with fewer cues needed.  Handwriting has improved. Will focus on memory and check writing.    Speech Therapy Frequency 2x / week    Duration Other (comment)    Treatment/Interventions Cognitive reorganization;Compensatory strategies;Language facilitation    Potential to Achieve Goals Good    Potential Considerations Ability to learn/carryover information;Pain level;Family/community support;Co-morbidities;Previous level of function;Cooperation/participation level;Severity of impairments    SLP Home Exercise Plan Privided    Consulted and Agree with Plan of Care Patient           Patient will benefit from skilled therapeutic intervention in order to improve the following deficits and impairments:    Cognitive communication deficit    Problem List Patient Active Problem List   Diagnosis Date Noted  . Abnormal ECG 04/21/2019  . Encounter for screening colonoscopy   . Polyp of colon   . Atherosclerotic heart disease of native coronary artery without angina pectoris 01/14/2019  . Depression 01/14/2019  . GERD (gastroesophageal reflux disease) 01/14/2019  . Periodic limb movement disorder (PLMD) 01/14/2019  . Sleep apnea 01/14/2019  . Type 2 diabetes mellitus without complications ([place]) 14/43/1540  . Stricture and stenosis of esophagus   . Problems with swallowing and mastication   . Food impaction of esophagus   . H/O coronary artery bypass surgery 01/17/2016  . Cerebrovascular accident (CVA) due to embolism ([place]) 01/03/2016  . Hypothyroidism, unspecified 01/03/2016  . HLD (hyperlipidemia) 01/03/2016  . Stable  angina ([place]) 11/29/2015  . Benign essential hypertension 05/27/2015  . Bilateral carotid artery stenosis 02/24/2015  . Atherosclerotic peripheral vascular disease ([place]) 09/07/2014  . Bladder calculus 08/12/2014  . Primary osteoarthritis of left knee 03/10/2014  . Elevated prostate specific antigen (PSA) 02/05/2013  . Snoring 01/22/2013  . Sleepiness 01/22/2013  . Dysphagia, unspecified(787.20) 01/22/2013  . Occlusion and stenosis of carotid artery without mention of cerebral infarction 01/22/2013  . Cerebral thrombosis with cerebral infarction ([place]) 01/22/2013  . Benign prostatic hyperplasia with incomplete bladder emptying 08/26/2012  . Chronic prostatitis 08/26/2012  . Family history of malignant neoplasm of prostate 08/26/2012  . Incomplete emptying of bladder 08/26/2012  . Dysphagia 05/14/2012  . Global aphasia 05/14/2012  . Stroke, acute, thrombotic ([place]) 05/12/2012  . Symptomatic carotid artery stenosis 05/12/2012  . HTN (hypertension) 05/12/2012  . COPD (chronic obstructive pulmonary disease) ([place]) 05/12/2012  . Acute respiratory failure ([place]) 05/12/2012    Leroy Sea, MS/CCC- SLP  Lou Miner 03/04/2020, 8:11 AM  [place] MAIN [facility] SERVICES [address] [place], [place], [address] Phone: [phone]   Fax:  [phone]   Name: Paul Bass MRN: [id number] Date of Birth: Aug 15, 1945

## 2020-03-08 ENCOUNTER — Ambulatory Visit: Payer: Medicare HMO | Admitting: Speech Pathology

## 2020-03-08 ENCOUNTER — Other Ambulatory Visit: Payer: Self-pay

## 2020-03-08 DIAGNOSIS — R41841 Cognitive communication deficit: Secondary | ICD-10-CM

## 2020-03-09 ENCOUNTER — Encounter: Payer: Self-pay | Admitting: Speech Pathology

## 2020-03-09 NOTE — Therapy (Signed)
[place] MAIN [facility] SERVICES [address] [place], [place], [address] Phone: [phone]   Fax:  [phone]  Speech Language Pathology Treatment  Patient Details  Name: Paul Bass MRN: [id number] Date of Birth: [birth year] Referring Provider (SLP): Dr. Manuella Ghazi   Encounter Date: 03/08/2020   End of Session - 03/09/20 1259    Visit Number 23    Number of Visits 32    Date for SLP Re-Evaluation 04/09/20    Authorization Type Medicare    Authorization Time Period Start 03/01/2020    Authorization - Visit Number 3    Progress Note Due on Visit 10    SLP Start Time 1500    SLP Stop Time  1550    SLP Time Calculation (min) 50 min    Activity Tolerance Patient tolerated treatment well           Past Medical History:  Diagnosis Date  . Anxiety   . BPH (benign prostatic hypertrophy)   . CAD S/P percutaneous coronary angioplasty   . COPD (chronic obstructive pulmonary disease) ([place])   . Depression   . Diabetes mellitus without complication ([place])   . Dyspnea   . GERD (gastroesophageal reflux disease)   . HTN (hypertension)   . Hyperlipidemia   . Hypothyroidism   . Stroke ([place])   . Tobacco abuse     Past Surgical History:  Procedure Laterality Date  . CARDIAC CATHETERIZATION N/A 12/07/2015   Procedure: Left Heart Cath and Coronary Angiography;  Surgeon: Corey Skains, MD;  Location: [place] CV LAB;  Service: Cardiovascular;  Laterality: N/A;  . COLONOSCOPY WITH PROPOFOL N/A 02/18/2019   Procedure: COLONOSCOPY WITH PROPOFOL;  Surgeon: Lucilla Lame, MD;  Location: [facility] ENDOSCOPY;  Service: Endoscopy;  Laterality: N/A;  . 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 CORONARY ARTERY BYPASS GRAFT    . CYSTOSCOPY WITH INSERTION OF UROLIFT N/A 04/29/2019   Procedure: CYSTOSCOPY WITH INSERTION OF UROLIFT;  Surgeon: Abbie Sons, MD;  Location: [facility] ORS;  Service: Urology;  Laterality: N/A;  .  ESOPHAGOGASTRODUODENOSCOPY (EGD) WITH PROPOFOL N/A 09/11/2016   Procedure: ESOPHAGOGASTRODUODENOSCOPY (EGD) WITH PROPOFOL;  Surgeon: Lucilla Lame, MD;  Location: [facility] ENDOSCOPY;  Service: Endoscopy;  Laterality: N/A;  . ESOPHAGOGASTRODUODENOSCOPY (EGD) WITH PROPOFOL N/A 10/17/2016   Procedure: ESOPHAGOGASTRODUODENOSCOPY (EGD) WITH PROPOFOL;  Surgeon: Lucilla Lame, MD;  Location: [facility] ENDOSCOPY;  Service: Endoscopy;  Laterality: N/A;  . ESOPHAGOGASTRODUODENOSCOPY (EGD) WITH PROPOFOL N/A 11/13/2017   Procedure: ESOPHAGOGASTRODUODENOSCOPY (EGD) WITH PROPOFOL;  Surgeon: Lucilla Lame, MD;  Location: [facility] ENDOSCOPY;  Service: Endoscopy;  Laterality: N/A;  . pins R lower leg    . rotator cuff replaced     right  . SAVORY DILATION N/A 10/17/2016   Procedure: SAVORY DILATION;  Surgeon: Lucilla Lame, MD;  Location: [facility] ENDOSCOPY;  Service: Endoscopy;  Laterality: N/A;    There were no vitals filed for this visit.   Subjective Assessment - 03/09/20 1259    Subjective "I didn't know a stroke would make things this confusing"                 ADULT SLP TREATMENT - 03/09/20 0001      General Information   Behavior/Cognition Alert;Cooperative;Pleasant mood    HPI  Paul Bass is 74 year old man, S/P CVA in 2013, referred for speech therapy for cognitive deficit post stroke.       Treatment Provided   Treatment provided  Cognitive-Linquistic      Pain Assessment   Pain Assessment No/denies pain      Cognitive-Linquistic Treatment   Treatment focused on Aphasia    Skilled Treatment WRITING: Write 3 checks, given bill and spelling sheet for aid, with 80% legibility.  VERBAL EXPRESSION:  Maintain conversation with no lapse in cogency. REMEBERING NAMES: Recall 11/11 names (photos of people he has "met" over the past sessions) with delays and self-cuing. MEMORY/WORD FINDING: Recall 8/10-word list given repetition and semantic cues.  State probably cause given effect with 90% accuracy.      Assessment  / Recommendations / Plan   Plan Continue with current plan of care      Progression Toward Goals   Progression toward goals Progressing toward goals            SLP Education - 03/09/20 1259    Education Details Legible writing, word finding strategies    Person(s) Educated Patient    Methods Explanation    Comprehension Verbalized understanding              SLP Long Term Goals - 02/23/20 1343      SLP LONG TERM GOAL #1   Title Patient will generate grammatical, fluent, and cogent sentences to complete abstract/complex linguistic task with 80% accuracy.    Status Partially Met    Target Date 04/09/20      SLP LONG TERM GOAL #2   Title Patient will complete complex memory and high level wording activities with 80% accuracy.    Status Partially Met    Target Date 04/09/20      SLP LONG TERM GOAL #3   Title Patient will write grammatical and cogent phrases to complete simple/concrete linguistic task with 80% accuracy.    Status Partially Met    Target Date 04/09/20            Plan - 03/09/20 1300    Clinical Impression Statement The patient demonstrates improved use of meaningful content words in structured and unstructured verbal tasks. He is remembering names with fewer cues needed.  Handwriting has improved. Will focus on self-expression and check writing.    Speech Therapy Frequency 2x / week    Duration Other (comment)    Treatment/Interventions Cognitive reorganization;Compensatory strategies;Language facilitation    Potential to Achieve Goals Good    Potential Considerations Ability to learn/carryover information;Pain level;Family/community support;Co-morbidities;Previous level of function;Cooperation/participation level;Severity of impairments    Consulted and Agree with Plan of Care Patient           Patient will benefit from skilled therapeutic intervention in order to improve the following deficits and impairments:   Cognitive communication  deficit    Problem List Patient Active Problem List   Diagnosis Date Noted  . Abnormal ECG 04/21/2019  . Encounter for screening colonoscopy   . Polyp of colon   . Atherosclerotic heart disease of native coronary artery without angina pectoris 01/14/2019  . Depression 01/14/2019  . GERD (gastroesophageal reflux disease) 01/14/2019  . Periodic limb movement disorder (PLMD) 01/14/2019  . Sleep apnea 01/14/2019  . Type 2 diabetes mellitus without complications ([place]) 68/34/1962  . Stricture and stenosis of esophagus   . Problems with swallowing and mastication   . Food impaction of esophagus   . H/O coronary artery bypass surgery 01/17/2016  . Cerebrovascular accident (CVA) due to embolism ([place]) 01/03/2016  . Hypothyroidism, unspecified 01/03/2016  . HLD (hyperlipidemia) 01/03/2016  . Stable angina ([place]) 11/29/2015  . Benign essential hypertension  05/27/2015  . Bilateral carotid artery stenosis 02/24/2015  . Atherosclerotic peripheral vascular disease ([place]) 09/07/2014  . Bladder calculus 08/12/2014  . Primary osteoarthritis of left knee 03/10/2014  . Elevated prostate specific antigen (PSA) 02/05/2013  . Snoring 01/22/2013  . Sleepiness 01/22/2013  . Dysphagia, unspecified(787.20) 01/22/2013  . Occlusion and stenosis of carotid artery without mention of cerebral infarction 01/22/2013  . Cerebral thrombosis with cerebral infarction ([place]) 01/22/2013  . Benign prostatic hyperplasia with incomplete bladder emptying 08/26/2012  . Chronic prostatitis 08/26/2012  . Family history of malignant neoplasm of prostate 08/26/2012  . Incomplete emptying of bladder 08/26/2012  . Dysphagia 05/14/2012  . Global aphasia 05/14/2012  . Stroke, acute, thrombotic ([place]) 05/12/2012  . Symptomatic carotid artery stenosis 05/12/2012  . HTN (hypertension) 05/12/2012  . COPD (chronic obstructive pulmonary disease) ([place]) 05/12/2012  . Acute respiratory failure ([place]) 05/12/2012   Leroy Sea,  MS/CCC- SLP  Lou Miner 03/09/2020, 1:01 PM  [place] MAIN [facility] SERVICES [address] [place], [place], [address] Phone: [phone]   Fax:  [phone]   Name: Paul Bass MRN: [id number] Date of Birth: Aug 30, 1945

## 2020-03-10 ENCOUNTER — Other Ambulatory Visit: Payer: Self-pay

## 2020-03-10 ENCOUNTER — Encounter: Payer: Self-pay | Admitting: Speech Pathology

## 2020-03-10 ENCOUNTER — Ambulatory Visit: Payer: Medicare HMO | Admitting: Speech Pathology

## 2020-03-10 DIAGNOSIS — R41841 Cognitive communication deficit: Secondary | ICD-10-CM

## 2020-03-10 NOTE — Therapy (Signed)
[place] MAIN [facility] SERVICES [address] [place], [place], [address] Phone: [phone]   Fax:  [phone]  Speech Language Pathology Treatment  Patient Details  Name: Paul Bass MRN: [id number] Date of Birth: 1945/11/24 Referring Provider (SLP): Dr. Manuella Ghazi   Encounter Date: 03/10/2020   End of Session - 03/10/20 1557    Visit Number 24    Number of Visits 32    Date for SLP Re-Evaluation 04/09/20    Authorization Type Medicare    Authorization Time Period Start 03/01/2020    Authorization - Visit Number 4    Progress Note Due on Visit 10    SLP Start Time 1500    SLP Stop Time  1555    SLP Time Calculation (min) 55 min    Activity Tolerance Patient tolerated treatment well           Past Medical History:  Diagnosis Date  . Anxiety   . BPH (benign prostatic hypertrophy)   . CAD S/P percutaneous coronary angioplasty   . COPD (chronic obstructive pulmonary disease) ([place])   . Depression   . Diabetes mellitus without complication ([place])   . Dyspnea   . GERD (gastroesophageal reflux disease)   . HTN (hypertension)   . Hyperlipidemia   . Hypothyroidism   . Stroke ([place])   . Tobacco abuse     Past Surgical History:  Procedure Laterality Date  . CARDIAC CATHETERIZATION N/A 12/07/2015   Procedure: Left Heart Cath and Coronary Angiography;  Surgeon: Corey Skains, MD;  Location: [place] CV LAB;  Service: Cardiovascular;  Laterality: N/A;  . COLONOSCOPY WITH PROPOFOL N/A 02/18/2019   Procedure: COLONOSCOPY WITH PROPOFOL;  Surgeon: Lucilla Lame, MD;  Location: [facility] ENDOSCOPY;  Service: Endoscopy;  Laterality: N/A;  . 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 CORONARY ARTERY BYPASS GRAFT    . CYSTOSCOPY WITH INSERTION OF UROLIFT N/A 04/29/2019   Procedure: CYSTOSCOPY WITH INSERTION OF UROLIFT;  Surgeon: Abbie Sons, MD;  Location: [facility] ORS;  Service: Urology;  Laterality: N/A;  .  ESOPHAGOGASTRODUODENOSCOPY (EGD) WITH PROPOFOL N/A 09/11/2016   Procedure: ESOPHAGOGASTRODUODENOSCOPY (EGD) WITH PROPOFOL;  Surgeon: Lucilla Lame, MD;  Location: [facility] ENDOSCOPY;  Service: Endoscopy;  Laterality: N/A;  . ESOPHAGOGASTRODUODENOSCOPY (EGD) WITH PROPOFOL N/A 10/17/2016   Procedure: ESOPHAGOGASTRODUODENOSCOPY (EGD) WITH PROPOFOL;  Surgeon: Lucilla Lame, MD;  Location: [facility] ENDOSCOPY;  Service: Endoscopy;  Laterality: N/A;  . ESOPHAGOGASTRODUODENOSCOPY (EGD) WITH PROPOFOL N/A 11/13/2017   Procedure: ESOPHAGOGASTRODUODENOSCOPY (EGD) WITH PROPOFOL;  Surgeon: Lucilla Lame, MD;  Location: [facility] ENDOSCOPY;  Service: Endoscopy;  Laterality: N/A;  . pins R lower leg    . rotator cuff replaced     right  . SAVORY DILATION N/A 10/17/2016   Procedure: SAVORY DILATION;  Surgeon: Lucilla Lame, MD;  Location: [facility] ENDOSCOPY;  Service: Endoscopy;  Laterality: N/A;    There were no vitals filed for this visit.   Subjective Assessment - 03/10/20 1556    Subjective "I didn't know a stroke would make things this confusing"                 ADULT SLP TREATMENT - 03/10/20 0001      General Information   Behavior/Cognition Alert;Cooperative;Pleasant mood    HPI  Paul Bass is 74 year old man, S/P CVA in 2013, referred for speech therapy for cognitive deficit post stroke.       Treatment Provided   Treatment provided  Cognitive-Linquistic      Pain Assessment   Pain Assessment No/denies pain      Cognitive-Linquistic Treatment   Treatment focused on Aphasia    Skilled Treatment WRITING: Write 3 checks, given bill and spelling sheet for aid, with 80% legibility.  VERBAL EXPRESSION:  Maintain conversation with no lapse in cogency. REMEBERING NAMES: Recall 11/11 names (photos of people he has "met" over the past sessions) with delays and self-cuing. MEMORY/WORD FINDING: Recall details of complex picture after coding (describing in detail to SLP) with 100% accuracy.  Recall object-picture associated  pairs with 100% accuracy using visual imagery.  State verb given definition with 75% accuracy.      Assessment / Recommendations / Plan   Plan Continue with current plan of care      Progression Toward Goals   Progression toward goals Progressing toward goals            SLP Education - 03/10/20 1556    Education Details Visual imagery as memory strategy    Methods Explanation    Comprehension Verbalized understanding              SLP Long Term Goals - 02/23/20 1343      SLP LONG TERM GOAL #1   Title Patient will generate grammatical, fluent, and cogent sentences to complete abstract/complex linguistic task with 80% accuracy.    Status Partially Met    Target Date 04/09/20      SLP LONG TERM GOAL #2   Title Patient will complete complex memory and high level wording activities with 80% accuracy.    Status Partially Met    Target Date 04/09/20      SLP LONG TERM GOAL #3   Title Patient will write grammatical and cogent phrases to complete simple/concrete linguistic task with 80% accuracy.    Status Partially Met    Target Date 04/09/20            Plan - 03/10/20 1558    Clinical Impression Statement The patient demonstrates improved use of meaningful content words in structured and unstructured verbal tasks. He is remembering names with fewer cues needed.  Handwriting has improved. Will focus on self-expression and check writing.    Speech Therapy Frequency 2x / week    Duration Other (comment)    Treatment/Interventions Cognitive reorganization;Compensatory strategies;Language facilitation    Potential to Achieve Goals Good    Potential Considerations Ability to learn/carryover information;Pain level;Family/community support;Co-morbidities;Previous level of function;Cooperation/participation level;Severity of impairments    Consulted and Agree with Plan of Care Patient           Patient will benefit from skilled therapeutic intervention in order to improve the  following deficits and impairments:   Cognitive communication deficit    Problem List Patient Active Problem List   Diagnosis Date Noted  . Abnormal ECG 04/21/2019  . Encounter for screening colonoscopy   . Polyp of colon   . Atherosclerotic heart disease of native coronary artery without angina pectoris 01/14/2019  . Depression 01/14/2019  . GERD (gastroesophageal reflux disease) 01/14/2019  . Periodic limb movement disorder (PLMD) 01/14/2019  . Sleep apnea 01/14/2019  . Type 2 diabetes mellitus without complications ([place]) 26/33/3545  . Stricture and stenosis of esophagus   . Problems with swallowing and mastication   . Food impaction of esophagus   . H/O coronary artery bypass surgery 01/17/2016  . Cerebrovascular accident (CVA) due to embolism ([place]) 01/03/2016  . Hypothyroidism, unspecified 01/03/2016  . HLD (hyperlipidemia) 01/03/2016  .  Stable angina ([name]) 11/29/2015  . Benign essential hypertension 05/27/2015  . Bilateral carotid artery stenosis 02/24/2015  . Atherosclerotic peripheral vascular disease ([name]) 09/07/2014  . Bladder calculus 08/12/2014  . Primary osteoarthritis of left knee 03/10/2014  . Elevated prostate specific antigen (PSA) 02/05/2013  . Snoring 01/22/2013  . Sleepiness 01/22/2013  . Dysphagia, unspecified(787.20) 01/22/2013  . Occlusion and stenosis of carotid artery without mention of cerebral infarction 01/22/2013  . Cerebral thrombosis with cerebral infarction ([name]) 01/22/2013  . Benign prostatic hyperplasia with incomplete bladder emptying 08/26/2012  . Chronic prostatitis 08/26/2012  . Family history of malignant neoplasm of prostate 08/26/2012  . Incomplete emptying of bladder 08/26/2012  . Dysphagia 05/14/2012  . Global aphasia 05/14/2012  . Stroke, acute, thrombotic ([name]) 05/12/2012  . Symptomatic carotid artery stenosis 05/12/2012  . HTN (hypertension) 05/12/2012  . COPD (chronic obstructive pulmonary disease) ([name]) 05/12/2012  . Acute  respiratory failure ([name]) 05/12/2012   Susan G Abernathy, MS/CCC- SLP  Abernathy, Susie 03/10/2020, 3:58 PM  [facility] REHAB SERVICES [address] [place], [place], [address] Phone: [phone]   Fax:  [phone]   Name: Paul Bass MRN: [id number] Date of Birth: 06/14/1946 

## 2020-03-15 ENCOUNTER — Ambulatory Visit: Payer: Medicare HMO | Admitting: Speech Pathology

## 2020-03-15 ENCOUNTER — Other Ambulatory Visit: Payer: Self-pay

## 2020-03-15 DIAGNOSIS — R41841 Cognitive communication deficit: Secondary | ICD-10-CM

## 2020-03-16 ENCOUNTER — Encounter: Payer: Self-pay | Admitting: Speech Pathology

## 2020-03-16 NOTE — Therapy (Signed)
[place] MAIN [facility] SERVICES [address] [place], [place], [address] Phone: [phone]   Fax:  [phone]  Speech Language Pathology Treatment  Patient Details  Name: Paul Bass MRN: [id number] Date of Birth: Nov 23, 1945 Referring Provider (SLP): Dr. Manuella Ghazi   Encounter Date: 03/15/2020   End of Session - 03/16/20 0831    Visit Number 25    Number of Visits 32    Date for SLP Re-Evaluation 04/09/20    Authorization Type Medicare    Authorization Time Period Start 03/01/2020    Authorization - Visit Number 5    Progress Note Due on Visit 10    SLP Start Time 1500    SLP Stop Time  1550    SLP Time Calculation (min) 50 min    Activity Tolerance Patient tolerated treatment well           Past Medical History:  Diagnosis Date  . Anxiety   . BPH (benign prostatic hypertrophy)   . CAD S/P percutaneous coronary angioplasty   . COPD (chronic obstructive pulmonary disease) ([place])   . Depression   . Diabetes mellitus without complication ([place])   . Dyspnea   . GERD (gastroesophageal reflux disease)   . HTN (hypertension)   . Hyperlipidemia   . Hypothyroidism   . Stroke ([place])   . Tobacco abuse     Past Surgical History:  Procedure Laterality Date  . CARDIAC CATHETERIZATION N/A 12/07/2015   Procedure: Left Heart Cath and Coronary Angiography;  Surgeon: Corey Skains, MD;  Location: [place] CV LAB;  Service: Cardiovascular;  Laterality: N/A;  . COLONOSCOPY WITH PROPOFOL N/A 02/18/2019   Procedure: COLONOSCOPY WITH PROPOFOL;  Surgeon: Lucilla Lame, MD;  Location: [facility] ENDOSCOPY;  Service: Endoscopy;  Laterality: N/A;  . 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 CORONARY ARTERY BYPASS GRAFT    . CYSTOSCOPY WITH INSERTION OF UROLIFT N/A 04/29/2019   Procedure: CYSTOSCOPY WITH INSERTION OF UROLIFT;  Surgeon: Abbie Sons, MD;  Location: [facility] ORS;  Service: Urology;  Laterality: N/A;  .  ESOPHAGOGASTRODUODENOSCOPY (EGD) WITH PROPOFOL N/A 09/11/2016   Procedure: ESOPHAGOGASTRODUODENOSCOPY (EGD) WITH PROPOFOL;  Surgeon: Lucilla Lame, MD;  Location: [facility] ENDOSCOPY;  Service: Endoscopy;  Laterality: N/A;  . ESOPHAGOGASTRODUODENOSCOPY (EGD) WITH PROPOFOL N/A 10/17/2016   Procedure: ESOPHAGOGASTRODUODENOSCOPY (EGD) WITH PROPOFOL;  Surgeon: Lucilla Lame, MD;  Location: [facility] ENDOSCOPY;  Service: Endoscopy;  Laterality: N/A;  . ESOPHAGOGASTRODUODENOSCOPY (EGD) WITH PROPOFOL N/A 11/13/2017   Procedure: ESOPHAGOGASTRODUODENOSCOPY (EGD) WITH PROPOFOL;  Surgeon: Lucilla Lame, MD;  Location: [facility] ENDOSCOPY;  Service: Endoscopy;  Laterality: N/A;  . pins R lower leg    . rotator cuff replaced     right  . SAVORY DILATION N/A 10/17/2016   Procedure: SAVORY DILATION;  Surgeon: Lucilla Lame, MD;  Location: [facility] ENDOSCOPY;  Service: Endoscopy;  Laterality: N/A;    There were no vitals filed for this visit.   Subjective Assessment - 03/16/20 0830    Subjective "I didn't know a stroke would make things this confusing"                 ADULT SLP TREATMENT - 03/16/20 0001      General Information   Behavior/Cognition Alert;Cooperative;Pleasant mood    HPI  Paul Bass is 74 year old man, S/P CVA in 2013, referred for speech therapy for cognitive deficit post stroke.       Treatment Provided   Treatment provided  Cognitive-Linquistic      Pain Assessment   Pain Assessment No/denies pain      Cognitive-Linquistic Treatment   Treatment focused on Aphasia    Skilled Treatment VERBAL EXPRESSION:  Maintain conversation with no lapse in cogency. REMEBERING NAMES: Recall 11/11 names (photos of people he has "met" over the past sessions) with delays and self-cuing. MEMORY/WORD FINDING: Recall details of complex picture after coding (describing in detail to SLP) with 100% accuracy.  Recall object-picture associated pairs with 100% accuracy using visual imagery.  Complete semantic feature analysis  for 7 items with min cues and understands how semantic feature analysis can aid word recall.      Assessment / Recommendations / Plan   Plan Continue with current plan of care      Progression Toward Goals   Progression toward goals Progressing toward goals            SLP Education - 03/16/20 0831    Education Details Semantic feature analysis    Person(s) Educated Patient    Methods Explanation    Comprehension Verbalized understanding              SLP Long Term Goals - 02/23/20 1343      SLP LONG TERM GOAL #1   Title Patient will generate grammatical, fluent, and cogent sentences to complete abstract/complex linguistic task with 80% accuracy.    Status Partially Met    Target Date 04/09/20      SLP LONG TERM GOAL #2   Title Patient will complete complex memory and high level wording activities with 80% accuracy.    Status Partially Met    Target Date 04/09/20      SLP LONG TERM GOAL #3   Title Patient will write grammatical and cogent phrases to complete simple/concrete linguistic task with 80% accuracy.    Status Partially Met    Target Date 04/09/20            Plan - 03/16/20 2778    Clinical Impression Statement The patient demonstrates improved use of meaningful content words in structured and unstructured verbal tasks. He is remembering names with fewer cues needed.  Handwriting has improved. Will focus on self-expression.    Speech Therapy Frequency 2x / week    Duration Other (comment)    Treatment/Interventions Cognitive reorganization;Compensatory strategies;Language facilitation    Potential to Achieve Goals Good    Potential Considerations Ability to learn/carryover information;Pain level;Family/community support;Co-morbidities;Previous level of function;Cooperation/participation level;Severity of impairments    Consulted and Agree with Plan of Care Patient           Patient will benefit from skilled therapeutic intervention in order to improve  the following deficits and impairments:   Cognitive communication deficit    Problem List Patient Active Problem List   Diagnosis Date Noted  . Abnormal ECG 04/21/2019  . Encounter for screening colonoscopy   . Polyp of colon   . Atherosclerotic heart disease of native coronary artery without angina pectoris 01/14/2019  . Depression 01/14/2019  . GERD (gastroesophageal reflux disease) 01/14/2019  . Periodic limb movement disorder (PLMD) 01/14/2019  . Sleep apnea 01/14/2019  . Type 2 diabetes mellitus without complications ([place]) 24/23/5361  . Stricture and stenosis of esophagus   . Problems with swallowing and mastication   . Food impaction of esophagus   . H/O coronary artery bypass surgery 01/17/2016  . Cerebrovascular accident (CVA) due to embolism ([place]) 01/03/2016  . Hypothyroidism, unspecified 01/03/2016  . HLD (hyperlipidemia) 01/03/2016  .  Stable angina ([place]) 11/29/2015  . Benign essential hypertension 05/27/2015  . Bilateral carotid artery stenosis 02/24/2015  . Atherosclerotic peripheral vascular disease ([place]) 09/07/2014  . Bladder calculus 08/12/2014  . Primary osteoarthritis of left knee 03/10/2014  . Elevated prostate specific antigen (PSA) 02/05/2013  . Snoring 01/22/2013  . Sleepiness 01/22/2013  . Dysphagia, unspecified(787.20) 01/22/2013  . Occlusion and stenosis of carotid artery without mention of cerebral infarction 01/22/2013  . Cerebral thrombosis with cerebral infarction ([place]) 01/22/2013  . Benign prostatic hyperplasia with incomplete bladder emptying 08/26/2012  . Chronic prostatitis 08/26/2012  . Family history of malignant neoplasm of prostate 08/26/2012  . Incomplete emptying of bladder 08/26/2012  . Dysphagia 05/14/2012  . Global aphasia 05/14/2012  . Stroke, acute, thrombotic ([place]) 05/12/2012  . Symptomatic carotid artery stenosis 05/12/2012  . HTN (hypertension) 05/12/2012  . COPD (chronic obstructive pulmonary disease) ([place]) 05/12/2012  .  Acute respiratory failure ([place]) 05/12/2012   Paul Sea, MS/CCC- SLP  Lou Miner 03/16/2020, 8:33 AM  [place] MAIN [facility] SERVICES [address] [place], [place], [address] Phone: [phone]   Fax:  [phone]   Name: Paul Bass MRN: [id number] Date of Birth: 1946-02-01

## 2020-03-17 ENCOUNTER — Other Ambulatory Visit: Payer: Self-pay

## 2020-03-17 ENCOUNTER — Ambulatory Visit: Payer: Medicare HMO | Admitting: Speech Pathology

## 2020-03-17 DIAGNOSIS — R41841 Cognitive communication deficit: Secondary | ICD-10-CM

## 2020-03-18 ENCOUNTER — Encounter: Payer: Self-pay | Admitting: Speech Pathology

## 2020-03-18 NOTE — Therapy (Signed)
[place] MAIN [facility] SERVICES [address] [place], [place], [address] Phone: [phone]   Fax:  [phone]  Speech Language Pathology Treatment  Patient Details  Name: Paul Bass MRN: [id number] Date of Birth: 1946/05/27 Referring Provider (SLP): Dr. Manuella Ghazi   Encounter Date: 03/17/2020   End of Session - 03/18/20 1417    Visit Number 26    Number of Visits 32    Date for SLP Re-Evaluation 04/09/20    Authorization Type Medicare    Authorization Time Period Start 03/01/2020    Authorization - Visit Number 6    Progress Note Due on Visit 10    SLP Start Time 1500    SLP Stop Time  1550    SLP Time Calculation (min) 50 min    Activity Tolerance Patient tolerated treatment well           Past Medical History:  Diagnosis Date  . Anxiety   . BPH (benign prostatic hypertrophy)   . CAD S/P percutaneous coronary angioplasty   . COPD (chronic obstructive pulmonary disease) ([place])   . Depression   . Diabetes mellitus without complication ([place])   . Dyspnea   . GERD (gastroesophageal reflux disease)   . HTN (hypertension)   . Hyperlipidemia   . Hypothyroidism   . Stroke ([place])   . Tobacco abuse     Past Surgical History:  Procedure Laterality Date  . CARDIAC CATHETERIZATION N/A 12/07/2015   Procedure: Left Heart Cath and Coronary Angiography;  Surgeon: Corey Skains, MD;  Location: [place] CV LAB;  Service: Cardiovascular;  Laterality: N/A;  . COLONOSCOPY WITH PROPOFOL N/A 02/18/2019   Procedure: COLONOSCOPY WITH PROPOFOL;  Surgeon: Lucilla Lame, MD;  Location[facility] ENDOSCOPY;  Service: Endoscopy;  Laterality: N/A;  . 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 CORONARY ARTERY BYPASS GRAFT    . CYSTOSCOPY WITH INSERTION OF UROLIFT N/A 04/29/2019   Procedure: CYSTOSCOPY WITH INSERTION OF UROLIFT;  Surgeon: Abbie Sons, MD;  Location: [facility] ORS;  Service: Urology;  Laterality: N/A;  .  ESOPHAGOGASTRODUODENOSCOPY (EGD) WITH PROPOFOL N/A 09/11/2016   Procedure: ESOPHAGOGASTRODUODENOSCOPY (EGD) WITH PROPOFOL;  Surgeon: Lucilla Lame, MD;  Location: [facility] ENDOSCOPY;  Service: Endoscopy;  Laterality: N/A;  . ESOPHAGOGASTRODUODENOSCOPY (EGD) WITH PROPOFOL N/A 10/17/2016   Procedure: ESOPHAGOGASTRODUODENOSCOPY (EGD) WITH PROPOFOL;  Surgeon: Lucilla Lame, MD;  Location: [facility] ENDOSCOPY;  Service: Endoscopy;  Laterality: N/A;  . ESOPHAGOGASTRODUODENOSCOPY (EGD) WITH PROPOFOL N/A 11/13/2017   Procedure: ESOPHAGOGASTRODUODENOSCOPY (EGD) WITH PROPOFOL;  Surgeon: Lucilla Lame, MD;  Location: [facility] ENDOSCOPY;  Service: Endoscopy;  Laterality: N/A;  . pins R lower leg    . rotator cuff replaced     right  . SAVORY DILATION N/A 10/17/2016   Procedure: SAVORY DILATION;  Surgeon: Lucilla Lame, MD;  Location: [facility] ENDOSCOPY;  Service: Endoscopy;  Laterality: N/A;    There were no vitals filed for this visit.   Subjective Assessment - 03/18/20 1417    Subjective "I didn't know a stroke would make things this confusing"                 ADULT SLP TREATMENT - 03/18/20 0001      General Information   Behavior/Cognition Alert;Cooperative;Pleasant mood    HPI  Paul Bass is 74 year old man, S/P CVA in 2013, referred for speech therapy for cognitive deficit post stroke.       Treatment Provided   Treatment provided  Cognitive-Linquistic      Pain Assessment   Pain Assessment No/denies pain      Cognitive-Linquistic Treatment   Treatment focused on Aphasia    Skilled Treatment VERBAL EXPRESSION:  Maintain conversation with no lapse in cogency. WORD FINDING: complete moderately complex word finding activities (name objects by attributes, state which item does not belong, name general category, name subcategory) given mod SLP cues.      Assessment / Recommendations / Plan   Plan Continue with current plan of care      Progression Toward Goals   Progression toward goals Progressing toward  goals            SLP Education - 03/18/20 1417    Education Details word finding strategies    Person(s) Educated Patient    Methods Explanation    Comprehension Verbalized understanding              SLP Long Term Goals - 02/23/20 1343      SLP LONG TERM GOAL #1   Title Patient will generate grammatical, fluent, and cogent sentences to complete abstract/complex linguistic task with 80% accuracy.    Status Partially Met    Target Date 04/09/20      SLP LONG TERM GOAL #2   Title Patient will complete complex memory and high level wording activities with 80% accuracy.    Status Partially Met    Target Date 04/09/20      SLP LONG TERM GOAL #3   Title Patient will write grammatical and cogent phrases to complete simple/concrete linguistic task with 80% accuracy.    Status Partially Met    Target Date 04/09/20            Plan - 03/18/20 1418    Clinical Impression Statement The patient demonstrates improved use of meaningful content words in structured and unstructured verbal tasks. He is remembering names with fewer cues needed.  Handwriting has improved. Will focus on self-expression.    Speech Therapy Frequency 2x / week    Duration Other (comment)    Treatment/Interventions Cognitive reorganization;Compensatory strategies;Language facilitation    Potential to Achieve Goals Good    Potential Considerations Ability to learn/carryover information;Pain level;Family/community support;Co-morbidities;Previous level of function;Cooperation/participation level;Severity of impairments    Consulted and Agree with Plan of Care Patient           Patient will benefit from skilled therapeutic intervention in order to improve the following deficits and impairments:   Cognitive communication deficit    Problem List Patient Active Problem List   Diagnosis Date Noted  . Abnormal ECG 04/21/2019  . Encounter for screening colonoscopy   . Polyp of colon   . Atherosclerotic  heart disease of native coronary artery without angina pectoris 01/14/2019  . Depression 01/14/2019  . GERD (gastroesophageal reflux disease) 01/14/2019  . Periodic limb movement disorder (PLMD) 01/14/2019  . Sleep apnea 01/14/2019  . Type 2 diabetes mellitus without complications ([place]) 00/76/2263  . Stricture and stenosis of esophagus   . Problems with swallowing and mastication   . Food impaction of esophagus   . H/O coronary artery bypass surgery 01/17/2016  . Cerebrovascular accident (CVA) due to embolism ([place]) 01/03/2016  . Hypothyroidism, unspecified 01/03/2016  . HLD (hyperlipidemia) 01/03/2016  . Stable angina ([place]) 11/29/2015  . Benign essential hypertension 05/27/2015  . Bilateral carotid artery stenosis 02/24/2015  . Atherosclerotic peripheral vascular disease ([place]) 09/07/2014  . Bladder calculus 08/12/2014  . Primary osteoarthritis of left knee 03/10/2014  .  Elevated prostate specific antigen (PSA) 02/05/2013  . Snoring 01/22/2013  . Sleepiness 01/22/2013  . Dysphagia, unspecified(787.20) 01/22/2013  . Occlusion and stenosis of carotid artery without mention of cerebral infarction 01/22/2013  . Cerebral thrombosis with cerebral infarction ([place]) 01/22/2013  . Benign prostatic hyperplasia with incomplete bladder emptying 08/26/2012  . Chronic prostatitis 08/26/2012  . Family history of malignant neoplasm of prostate 08/26/2012  . Incomplete emptying of bladder 08/26/2012  . Dysphagia 05/14/2012  . Global aphasia 05/14/2012  . Stroke, acute, thrombotic ([place]) 05/12/2012  . Symptomatic carotid artery stenosis 05/12/2012  . HTN (hypertension) 05/12/2012  . COPD (chronic obstructive pulmonary disease) ([place]) 05/12/2012  . Acute respiratory failure ([place]) 05/12/2012   Leroy Sea, MS/CCC- SLP  Lou Miner 03/18/2020, 2:19 PM  [place] MAIN [facility] SERVICES [address] [place], [place], [address] Phone: [phone]   Fax:   [phone]   Name: Paul Bass MRN: [id number] Date of Birth: Nov 30, 1945

## 2020-03-22 ENCOUNTER — Other Ambulatory Visit: Payer: Self-pay

## 2020-03-22 ENCOUNTER — Ambulatory Visit: Payer: Medicare HMO | Admitting: Speech Pathology

## 2020-03-22 DIAGNOSIS — R41841 Cognitive communication deficit: Secondary | ICD-10-CM

## 2020-03-23 ENCOUNTER — Encounter: Payer: Self-pay | Admitting: Speech Pathology

## 2020-03-23 NOTE — Therapy (Signed)
[place] MAIN [facility] SERVICES [address] [place], [place], [address] Phone: [phone]   Fax:  [phone]  Speech Language Pathology Treatment  Patient Details  Name: Paul Bass MRN: [id number] Date of Birth: 08-Nov-1945 Referring Provider (SLP): Dr. Manuella Ghazi   Encounter Date: 03/22/2020   End of Session - 03/23/20 1400    Visit Number 27    Number of Visits 32    Date for SLP Re-Evaluation 04/09/20    Authorization Type Medicare    Authorization Time Period Start 03/01/2020    Authorization - Visit Number 7    Progress Note Due on Visit 10    SLP Start Time 1500    SLP Stop Time  1550    SLP Time Calculation (min) 50 min    Activity Tolerance Patient tolerated treatment well           Past Medical History:  Diagnosis Date  . Anxiety   . BPH (benign prostatic hypertrophy)   . CAD S/P percutaneous coronary angioplasty   . COPD (chronic obstructive pulmonary disease) ([place])   . Depression   . Diabetes mellitus without complication ([place])   . Dyspnea   . GERD (gastroesophageal reflux disease)   . HTN (hypertension)   . Hyperlipidemia   . Hypothyroidism   . Stroke ([place])   . Tobacco abuse     Past Surgical History:  Procedure Laterality Date  . CARDIAC CATHETERIZATION N/A 12/07/2015   Procedure: Left Heart Cath and Coronary Angiography;  Surgeon: Corey Skains, MD;  Location: [place] CV LAB;  Service: Cardiovascular;  Laterality: N/A;  . COLONOSCOPY WITH PROPOFOL N/A 02/18/2019   Procedure: COLONOSCOPY WITH PROPOFOL;  Surgeon: Lucilla Lame, MD;  Location: [facility] ENDOSCOPY;  Service: Endoscopy;  Laterality: N/A;  . 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 CORONARY ARTERY BYPASS GRAFT    . CYSTOSCOPY WITH INSERTION OF UROLIFT N/A 04/29/2019   Procedure: CYSTOSCOPY WITH INSERTION OF UROLIFT;  Surgeon: Abbie Sons, MD;  Location: [facility] ORS;  Service: Urology;  Laterality: N/A;  .  ESOPHAGOGASTRODUODENOSCOPY (EGD) WITH PROPOFOL N/A 09/11/2016   Procedure: ESOPHAGOGASTRODUODENOSCOPY (EGD) WITH PROPOFOL;  Surgeon: Lucilla Lame, MD;  Location: [facility] ENDOSCOPY;  Service: Endoscopy;  Laterality: N/A;  . ESOPHAGOGASTRODUODENOSCOPY (EGD) WITH PROPOFOL N/A 10/17/2016   Procedure: ESOPHAGOGASTRODUODENOSCOPY (EGD) WITH PROPOFOL;  Surgeon: Lucilla Lame, MD;  Location: [facility] ENDOSCOPY;  Service: Endoscopy;  Laterality: N/A;  . ESOPHAGOGASTRODUODENOSCOPY (EGD) WITH PROPOFOL N/A 11/13/2017   Procedure: ESOPHAGOGASTRODUODENOSCOPY (EGD) WITH PROPOFOL;  Surgeon: Lucilla Lame, MD;  Location: [facility] ENDOSCOPY;  Service: Endoscopy;  Laterality: N/A;  . pins R lower leg    . rotator cuff replaced     right  . SAVORY DILATION N/A 10/17/2016   Procedure: SAVORY DILATION;  Surgeon: Lucilla Lame, MD;  Location: [facility] ENDOSCOPY;  Service: Endoscopy;  Laterality: N/A;    There were no vitals filed for this visit.   Subjective Assessment - 03/23/20 1358    Subjective Patient stated "I can't think of it" during some moments of word finding difficulty. Patient was engaged and provided thoughtful, in depth responses throughout the session.                 ADULT SLP TREATMENT - 03/23/20 0001      General Information   Behavior/Cognition Alert;Cooperative;Pleasant mood    HPI  Paul Bass is 74 year old man, S/P CVA in 2013, referred for speech therapy for  cognitive deficit post stroke.       Treatment Provided   Treatment provided Cognitive-Linquistic      Pain Assessment   Pain Assessment No/denies pain      Cognitive-Linquistic Treatment   Treatment focused on Aphasia    Skilled Treatment VERBAL EXPRESSION: Maintained clear conversation with no lapse in cogency.       Assessment / Recommendations / Plan   Plan Continue with current plan of care      Progression Toward Goals   Progression toward goals Progressing toward goals            SLP Education - 03/23/20 1359    Education  Details Functional word finding strategies              SLP Long Term Goals - 02/23/20 1343      SLP LONG TERM GOAL #1   Title Patient will generate grammatical, fluent, and cogent sentences to complete abstract/complex linguistic task with 80% accuracy.    Status Partially Met    Target Date 04/09/20      SLP LONG TERM GOAL #2   Title Patient will complete complex memory and high level wording activities with 80% accuracy.    Status Partially Met    Target Date 04/09/20      SLP LONG TERM GOAL #3   Title Patient will write grammatical and cogent phrases to complete simple/concrete linguistic task with 80% accuracy.    Status Partially Met    Target Date 04/09/20            Plan - 03/23/20 1402    Clinical Impression Statement Expression is improving within structured tasks and unstructured conversation. Continue to focus on self cueing (ex. In name recall) to aid in verbal expression. Description of objects and words continues to become more specific with less word finding difficulties.    Speech Therapy Frequency 2x / week    Duration Other (comment)    Treatment/Interventions Cognitive reorganization;Compensatory strategies;Language facilitation    Potential to Achieve Goals Good    Potential Considerations Ability to learn/carryover information;Pain level;Family/community support;Co-morbidities;Previous level of function;Cooperation/participation level;Severity of impairments    Consulted and Agree with Plan of Care Patient           Patient will benefit from skilled therapeutic intervention in order to improve the following deficits and impairments:   Cognitive communication deficit    Problem List Patient Active Problem List   Diagnosis Date Noted  . Abnormal ECG 04/21/2019  . Encounter for screening colonoscopy   . Polyp of colon   . Atherosclerotic heart disease of native coronary artery without angina pectoris 01/14/2019  . Depression 01/14/2019  . GERD  (gastroesophageal reflux disease) 01/14/2019  . Periodic limb movement disorder (PLMD) 01/14/2019  . Sleep apnea 01/14/2019  . Type 2 diabetes mellitus without complications ([place]) 09/81/1914  . Stricture and stenosis of esophagus   . Problems with swallowing and mastication   . Food impaction of esophagus   . H/O coronary artery bypass surgery 01/17/2016  . Cerebrovascular accident (CVA) due to embolism ([place]) 01/03/2016  . Hypothyroidism, unspecified 01/03/2016  . HLD (hyperlipidemia) 01/03/2016  . Stable angina ([place]) 11/29/2015  . Benign essential hypertension 05/27/2015  . Bilateral carotid artery stenosis 02/24/2015  . Atherosclerotic peripheral vascular disease ([place]) 09/07/2014  . Bladder calculus 08/12/2014  . Primary osteoarthritis of left knee 03/10/2014  . Elevated prostate specific antigen (PSA) 02/05/2013  . Snoring 01/22/2013  . Sleepiness 01/22/2013  . Dysphagia, unspecified(787.20)  01/22/2013  . Occlusion and stenosis of carotid artery without mention of cerebral infarction 01/22/2013  . Cerebral thrombosis with cerebral infarction ([place]) 01/22/2013  . Benign prostatic hyperplasia with incomplete bladder emptying 08/26/2012  . Chronic prostatitis 08/26/2012  . Family history of malignant neoplasm of prostate 08/26/2012  . Incomplete emptying of bladder 08/26/2012  . Dysphagia 05/14/2012  . Global aphasia 05/14/2012  . Stroke, acute, thrombotic ([place]) 05/12/2012  . Symptomatic carotid artery stenosis 05/12/2012  . HTN (hypertension) 05/12/2012  . COPD (chronic obstructive pulmonary disease) ([place]) 05/12/2012  . Acute respiratory failure ([place]) 05/12/2012   Lawernce Pitts, BS Graduate Clinician Lawernce Pitts 03/23/2020, 2:03 PM  [place] MAIN [facility] SERVICES [address] [place], [place], [address] Phone: [phone]   Fax:  [phone]   Name: Paul Bass MRN: [id number] Date of Birth: [birth year]

## 2020-03-24 ENCOUNTER — Ambulatory Visit: Payer: Medicare HMO | Admitting: Speech Pathology

## 2020-03-24 ENCOUNTER — Other Ambulatory Visit: Payer: Self-pay

## 2020-03-24 DIAGNOSIS — R41841 Cognitive communication deficit: Secondary | ICD-10-CM

## 2020-03-25 ENCOUNTER — Encounter: Payer: Self-pay | Admitting: Speech Pathology

## 2020-03-25 NOTE — Therapy (Signed)
[place] MAIN [facility] SERVICES [address] [place], [place], [address] Phone: [phone]   Fax:  [phone]  Speech Language Pathology Treatment/Discharge Summary  Patient Details  Name: Paul Bass MRN: [id number] Date of Birth: 1946-04-17 Referring Provider (SLP): Dr. Manuella Ghazi   Encounter Date: 03/24/2020   End of Session - 03/25/20 0858    Visit Number 28    Number of Visits 32    Date for SLP Re-Evaluation 04/09/20    Authorization Type Medicare    Authorization Time Period Start 03/01/2020    Authorization - Visit Number 7    Authorization - Number of Visits 10    Progress Note Due on Visit 10    SLP Start Time 1500    SLP Stop Time  1550    SLP Time Calculation (min) 50 min    Activity Tolerance Patient tolerated treatment well           Past Medical History:  Diagnosis Date  . Anxiety   . BPH (benign prostatic hypertrophy)   . CAD S/P percutaneous coronary angioplasty   . COPD (chronic obstructive pulmonary disease) ([place])   . Depression   . Diabetes mellitus without complication ([place])   . Dyspnea   . GERD (gastroesophageal reflux disease)   . HTN (hypertension)   . Hyperlipidemia   . Hypothyroidism   . Stroke ([place])   . Tobacco abuse     Past Surgical History:  Procedure Laterality Date  . CARDIAC CATHETERIZATION N/A 12/07/2015   Procedure: Left Heart Cath and Coronary Angiography;  Surgeon: Corey Skains, MD;  Location: [place] CV LAB;  Service: Cardiovascular;  Laterality: N/A;  . COLONOSCOPY WITH PROPOFOL N/A 02/18/2019   Procedure: COLONOSCOPY WITH PROPOFOL;  Surgeon: Lucilla Lame, MD;  Location: [facility] ENDOSCOPY;  Service: Endoscopy;  Laterality: N/A;  . 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 CORONARY ARTERY BYPASS GRAFT    . CYSTOSCOPY WITH INSERTION OF UROLIFT N/A 04/29/2019   Procedure: CYSTOSCOPY WITH INSERTION OF UROLIFT;  Surgeon: Abbie Sons, MD;   Location: [facility] ORS;  Service: Urology;  Laterality: N/A;  . ESOPHAGOGASTRODUODENOSCOPY (EGD) WITH PROPOFOL N/A 09/11/2016   Procedure: ESOPHAGOGASTRODUODENOSCOPY (EGD) WITH PROPOFOL;  Surgeon: Lucilla Lame, MD;  Location: [facility] ENDOSCOPY;  Service: Endoscopy;  Laterality: N/A;  . ESOPHAGOGASTRODUODENOSCOPY (EGD) WITH PROPOFOL N/A 10/17/2016   Procedure: ESOPHAGOGASTRODUODENOSCOPY (EGD) WITH PROPOFOL;  Surgeon: Lucilla Lame, MD;  Location: [facility] ENDOSCOPY;  Service: Endoscopy;  Laterality: N/A;  . ESOPHAGOGASTRODUODENOSCOPY (EGD) WITH PROPOFOL N/A 11/13/2017   Procedure: ESOPHAGOGASTRODUODENOSCOPY (EGD) WITH PROPOFOL;  Surgeon: Lucilla Lame, MD;  Location: [facility] ENDOSCOPY;  Service: Endoscopy;  Laterality: N/A;  . pins R lower leg    . rotator cuff replaced     right  . SAVORY DILATION N/A 10/17/2016   Procedure: SAVORY DILATION;  Surgeon: Lucilla Lame, MD;  Location: [facility] ENDOSCOPY;  Service: Endoscopy;  Laterality: N/A;    There were no vitals filed for this visit.   Subjective Assessment - 03/25/20 0857    Subjective "You've taught me a lot". Provided detailed, cohesive responses throughout the session.                 ADULT SLP TREATMENT - 03/25/20 0001      General Information   Behavior/Cognition Alert;Cooperative;Pleasant mood    HPI  Paul Bass is 74 year old man, S/P CVA in 2013, referred for speech therapy for cognitive deficit post stroke.  Treatment Provided   Treatment provided Cognitive-Linquistic      Pain Assessment   Pain Assessment No/denies pain      Cognitive-Linquistic Treatment   Treatment focused on Aphasia    Skilled Treatment VERBAL EXPRESSION: Maintained conversation with no lapse in cogency and expressed fluent sentences in response to tasks. REMEMBERING NAMES: Recalled 10/11 (photos of people he has "met" over the past sessions) names with self cueing independently. WORD FINDING: Completed moderately complex word finding activities and answered  complex questions (object description/ convergent reasoning, naming attributes, general knowledge and complex questions, naming items and category members, describing functional tasks) with minimal cueing from clinician. Verbal communication contained minimal instances of word finding.      Assessment / Recommendations / Plan   Plan Continue with current plan of care      Progression Toward Goals   Progression toward goals Progressing toward goals            SLP Education - 03/25/20 0858    Education Details Multi-modal communication, functional word finding strategies              SLP Long Term Goals - 03/25/20 1518      SLP LONG TERM GOAL #1   Title Patient will generate grammatical, fluent, and cogent sentences to complete abstract/complex linguistic task with 80% accuracy.    Status Achieved      SLP LONG TERM GOAL #2   Title Patient will complete complex memory and high level wording activities with 80% accuracy.    Status Achieved      SLP LONG TERM GOAL #3   Title Patient will write grammatical and cogent phrases to complete simple/concrete linguistic task with 80% accuracy.    Status Achieved            Plan - 03/25/20 0859    Clinical Impression Statement The patient has met his goals and is ready for discharge. The patient demonstrates effective communication; he uses gestures / circumlocution when he experiences any moments of word finding difficulties and successfully uses self-cueing when naming or attempting to describe a specific object. The patient reports that his memory is functional, using his wife and written information to maintain organization.  The patient will continue to target his writing goal independently by slowing down and utilizing lined paper to improve outcomes.    Speech Therapy Frequency 2x / week    Duration Other (comment)    Treatment/Interventions Cognitive reorganization;Compensatory strategies;Language facilitation    Potential to  Achieve Goals Good    Potential Considerations Ability to learn/carryover information;Pain level;Family/community support;Co-morbidities;Previous level of function;Cooperation/participation level;Severity of impairments    Consulted and Agree with Plan of Care Patient           Patient will benefit from skilled therapeutic intervention in order to improve the following deficits and impairments:   Cognitive communication deficit    Problem List Patient Active Problem List   Diagnosis Date Noted  . Abnormal ECG 04/21/2019  . Encounter for screening colonoscopy   . Polyp of colon   . Atherosclerotic heart disease of native coronary artery without angina pectoris 01/14/2019  . Depression 01/14/2019  . GERD (gastroesophageal reflux disease) 01/14/2019  . Periodic limb movement disorder (PLMD) 01/14/2019  . Sleep apnea 01/14/2019  . Type 2 diabetes mellitus without complications ([place]) 96/75/9163  . Stricture and stenosis of esophagus   . Problems with swallowing and mastication   . Food impaction of esophagus   . H/O coronary artery bypass  surgery 01/17/2016  . Cerebrovascular accident (CVA) due to embolism ([place]) 01/03/2016  . Hypothyroidism, unspecified 01/03/2016  . HLD (hyperlipidemia) 01/03/2016  . Stable angina ([place]) 11/29/2015  . Benign essential hypertension 05/27/2015  . Bilateral carotid artery stenosis 02/24/2015  . Atherosclerotic peripheral vascular disease ([place]) 09/07/2014  . Bladder calculus 08/12/2014  . Primary osteoarthritis of left knee 03/10/2014  . Elevated prostate specific antigen (PSA) 02/05/2013  . Snoring 01/22/2013  . Sleepiness 01/22/2013  . Dysphagia, unspecified(787.20) 01/22/2013  . Occlusion and stenosis of carotid artery without mention of cerebral infarction 01/22/2013  . Cerebral thrombosis with cerebral infarction ([place]) 01/22/2013  . Benign prostatic hyperplasia with incomplete bladder emptying 08/26/2012  . Chronic prostatitis 08/26/2012  .  Family history of malignant neoplasm of prostate 08/26/2012  . Incomplete emptying of bladder 08/26/2012  . Dysphagia 05/14/2012  . Global aphasia 05/14/2012  . Stroke, acute, thrombotic ([place]) 05/12/2012  . Symptomatic carotid artery stenosis 05/12/2012  . HTN (hypertension) 05/12/2012  . COPD (chronic obstructive pulmonary disease) ([place]) 05/12/2012  . Acute respiratory failure ([place]) 05/12/2012    Lou Miner 03/25/2020, 3:19 PM  [place] MAIN [facility] SERVICES [address] [place], [place], [address] Phone: [phone]   Fax:  [phone]   Name: Paul Bass MRN: [id number] Date of Birth: October 27, 1945

## 2020-03-29 ENCOUNTER — Encounter: Payer: Medicare HMO | Admitting: Speech Pathology

## 2020-03-31 ENCOUNTER — Encounter: Payer: Medicare HMO | Admitting: Speech Pathology

## 2020-05-02 ENCOUNTER — Other Ambulatory Visit: Payer: Self-pay | Admitting: Urology

## 2020-05-02 DIAGNOSIS — N401 Enlarged prostate with lower urinary tract symptoms: Secondary | ICD-10-CM

## 2020-05-12 ENCOUNTER — Other Ambulatory Visit: Payer: Self-pay

## 2020-05-12 ENCOUNTER — Ambulatory Visit: Payer: Medicare HMO | Admitting: Urology

## 2020-05-12 ENCOUNTER — Encounter: Payer: Self-pay | Admitting: Urology

## 2020-05-12 VITALS — BP 126/73 | HR 82 | Ht 72.0 in | Wt 232.0 lb

## 2020-05-12 DIAGNOSIS — R351 Nocturia: Secondary | ICD-10-CM | POA: Diagnosis not present

## 2020-05-12 DIAGNOSIS — N401 Enlarged prostate with lower urinary tract symptoms: Secondary | ICD-10-CM | POA: Diagnosis not present

## 2020-05-12 LAB — BLADDER SCAN AMB NON-IMAGING

## 2020-05-13 ENCOUNTER — Encounter: Payer: Self-pay | Admitting: Urology

## 2020-05-13 NOTE — Progress Notes (Signed)
05/12/2020 7:47 AM   [name] Oct 05, 1945 [id number]  Referring provider: [name], NP [address] [place],  [place] [address]  Chief Complaint  Patient presents with  . Benign Prostatic Hypertrophy    Urologic history: 1.  BPH with LUTS -UroLift 04/29/2019  HPI: 74 y.o. male presents for follow-up.   Had improved but persistent symptoms postop with an elevated residual  Currently doing better with only major complaint nocturia x4  Denies dysuria, gross hematuria  Denies flank, abdominal or pelvic pain  Bladder scan PVR today 35 mL   PMH: Past Medical History:  Diagnosis Date  . Anxiety   . BPH (benign prostatic hypertrophy)   . CAD S/P percutaneous coronary angioplasty   . COPD (chronic obstructive pulmonary disease) ([place])   . Depression   . Diabetes mellitus without complication ([place])   . Dyspnea   . GERD (gastroesophageal reflux disease)   . HTN (hypertension)   . Hyperlipidemia   . Hypothyroidism   . Stroke ([place])   . Tobacco abuse     Surgical History: Past Surgical History:  Procedure Laterality Date  . CARDIAC CATHETERIZATION N/A 12/07/2015   Procedure: Left Heart Cath and Coronary Angiography;  Surgeon: Corey Skains, MD;  Location: [place] CV LAB;  Service: Cardiovascular;  Laterality: N/A;  . COLONOSCOPY WITH PROPOFOL N/A 02/18/2019   Procedure: COLONOSCOPY WITH PROPOFOL;  Surgeon: Lucilla Lame, MD;  Location: [facility] ENDOSCOPY;  Service: Endoscopy;  Laterality: N/A;  . 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 CORONARY ARTERY BYPASS GRAFT    . CYSTOSCOPY WITH INSERTION OF UROLIFT N/A 04/29/2019   Procedure: CYSTOSCOPY WITH INSERTION OF UROLIFT;  Surgeon: Abbie Sons, MD;  Location: [facility] ORS;  Service: Urology;  Laterality: N/A;  . ESOPHAGOGASTRODUODENOSCOPY (EGD) WITH PROPOFOL N/A 09/11/2016   Procedure: ESOPHAGOGASTRODUODENOSCOPY (EGD) WITH PROPOFOL;  Surgeon: Lucilla Lame, MD;   Location: [facility] ENDOSCOPY;  Service: Endoscopy;  Laterality: N/A;  . ESOPHAGOGASTRODUODENOSCOPY (EGD) WITH PROPOFOL N/A 10/17/2016   Procedure: ESOPHAGOGASTRODUODENOSCOPY (EGD) WITH PROPOFOL;  Surgeon: Lucilla Lame, MD;  Location: [facility] ENDOSCOPY;  Service: Endoscopy;  Laterality: N/A;  . ESOPHAGOGASTRODUODENOSCOPY (EGD) WITH PROPOFOL N/A 11/13/2017   Procedure: ESOPHAGOGASTRODUODENOSCOPY (EGD) WITH PROPOFOL;  Surgeon: Lucilla Lame, MD;  Location: [facility] ENDOSCOPY;  Service: Endoscopy;  Laterality: N/A;  . pins R lower leg    . rotator cuff replaced     right  . SAVORY DILATION N/A 10/17/2016   Procedure: SAVORY DILATION;  Surgeon: Lucilla Lame, MD;  Location: [facility] ENDOSCOPY;  Service: Endoscopy;  Laterality: N/A;    Home Medications:  Allergies as of 05/12/2020   No Known Allergies     Medication List       Accurate as of May 12, 2020 11:59 PM. If you have any questions, ask your nurse or doctor.        albuterol 108 (90 Base) MCG/ACT inhaler Commonly known as: VENTOLIN HFA Inhale 2 puffs into the lungs every 6 (six) hours as needed for wheezing or shortness of breath. For shortness of breath   aspirin EC 81 MG tablet Take 81 mg by mouth daily.   atorvastatin 80 MG tablet Commonly known as: LIPITOR Take 80 mg by mouth daily.   budesonide-formoterol 160-4.5 MCG/ACT inhaler Commonly known as: SYMBICORT Inhale 2 puffs into the lungs daily.   buPROPion 300 MG 24 hr tablet Commonly known as: WELLBUTRIN XL Take 300 mg by mouth at bedtime.  buPROPion 150 MG 24 hr tablet Commonly known as: WELLBUTRIN XL   cholecalciferol 25 MCG (1000 UNIT) tablet Commonly known as: VITAMIN D3 Take 1,000 Units by mouth every evening.   Cinnamon Bark Powd Take 1,000 mg by mouth daily.   ciprofloxacin 500 MG tablet Commonly known as: CIPRO Take 1 tablet (500 mg total) by mouth every 12 (twelve) hours.   citalopram 20 MG tablet Commonly known as: CELEXA Take 20 mg by mouth daily.    clonazePAM 0.5 MG tablet Commonly known as: KLONOPIN Take 0.5 mg by mouth at bedtime as needed (restless leg syndrome).   clopidogrel 75 MG tablet Commonly known as: PLAVIX Take 75 mg by mouth daily.   CoQ10 100 MG Caps Take 100 mg by mouth at bedtime.   Fish Oil 1200 MG Caps Take 1,000 mg by mouth 2 (two) times daily.   glucose blood test strip   levothyroxine 100 MCG tablet Commonly known as: SYNTHROID Take 100 mcg by mouth daily before breakfast.   memantine 5 MG tablet Commonly known as: NAMENDA Take 1 tablet by mouth daily.   memantine 10 MG tablet Commonly known as: NAMENDA   metFORMIN 500 MG tablet Commonly known as: GLUCOPHAGE Take 500 mg by mouth 2 (two) times daily.   multivitamin with minerals Tabs tablet Take 0.5 tablets by mouth 2 (two) times daily.   naproxen sodium 220 MG tablet Commonly known as: ALEVE Take 220-440 mg by mouth 2 (two) times daily as needed (pain.).   pantoprazole 40 MG tablet Commonly known as: PROTONIX Take 1 tablet (40 mg total) by mouth at bedtime.   psyllium 28 % packet Commonly known as: METAMUCIL SMOOTH TEXTURE Take 1 packet by mouth daily.   RA Krill Oil 500 MG Caps Take 500 mg by mouth at bedtime.   spironolactone 25 MG tablet Commonly known as: ALDACTONE   tamsulosin 0.4 MG Caps capsule Commonly known as: FLOMAX TAKE 1 CAPSULE BY MOUTH EVERY DAY   telmisartan 40 MG tablet Commonly known as: MICARDIS Take 1 tablet by mouth 1 day or 1 dose.   Uribel 118 MG Caps Take 1 capsule (118 mg total) by mouth 3 (three) times daily as needed (Urinary frequency, urgency, burning).       Allergies: No Known Allergies  Family History: Family History  Problem Relation Age of Onset  . Heart failure Father   . Throat cancer Mother   . Diabetes Brother     Social History:  reports that he quit smoking about 7 years ago. His smoking use included cigarettes. He smoked 2.00 packs per day. He has never used smokeless  tobacco. He reports previous alcohol use. He reports that he does not use drugs.   Physical Exam: BP 126/73   Pulse 82   Ht 6' (1.829 m)   Wt 232 lb (105.2 kg)   BMI 31.46 kg/m   Constitutional:  Alert and oriented, No acute distress. HEENT: [place] AT, moist mucus membranes.  Trachea midline, no masses. Cardiovascular: No clubbing, cyanosis, or edema. Respiratory: Normal respiratory effort, no increased work of breathing. Skin: No rashes, bruises or suspicious lesions. Neurologic: Grossly intact, no focal deficits, moving all 4 extremities. Psychiatric: Normal mood and affect.   Assessment & Plan:    1. Benign localized prostatic hyperplasia with lower urinary tract symptoms (LUTS)  Stable lower urinary tract symptoms  Bladder scan PVR 35 mL  Follow-up annually  2.  Nocturia  We discussed the potential causes of nocturia not related to  BPH including sleep apnea and nocturnal polyuria.  He does not desire further evaluation at this time   Abbie Sons, MD  [employer] [address] [place], [place] [address] [phone]

## 2020-05-27 DIAGNOSIS — I5022 Chronic systolic (congestive) heart failure: Secondary | ICD-10-CM | POA: Insufficient documentation

## 2020-08-30 ENCOUNTER — Telehealth: Payer: Self-pay | Admitting: Urology

## 2020-08-30 ENCOUNTER — Other Ambulatory Visit: Payer: Self-pay | Admitting: Urology

## 2020-08-30 DIAGNOSIS — N401 Enlarged prostate with lower urinary tract symptoms: Secondary | ICD-10-CM

## 2020-08-30 NOTE — Telephone Encounter (Signed)
He had a UroLift.  Is he still taking tamsulosin?

## 2020-09-03 NOTE — Telephone Encounter (Signed)
Pt calls triage line, states he is returning a missed call. Informed the patient that the call was in regards to him taking tamsulosion. Patient states that he is taking tamsulosin, in fact just had it refilled.

## 2020-11-21 IMAGING — CR ABDOMEN - 1 VIEW
1 series · 2 of 2 positions shown · non-contrast
Comparison: 05/14/2012.

CLINICAL DATA: Kidney stone follow-up.

EXAM:
ABDOMEN - 1 VIEW

[Series 1: dg abd 1 view · 0.14mm/px · 2 of 2 slices shown]
[im 1/2]
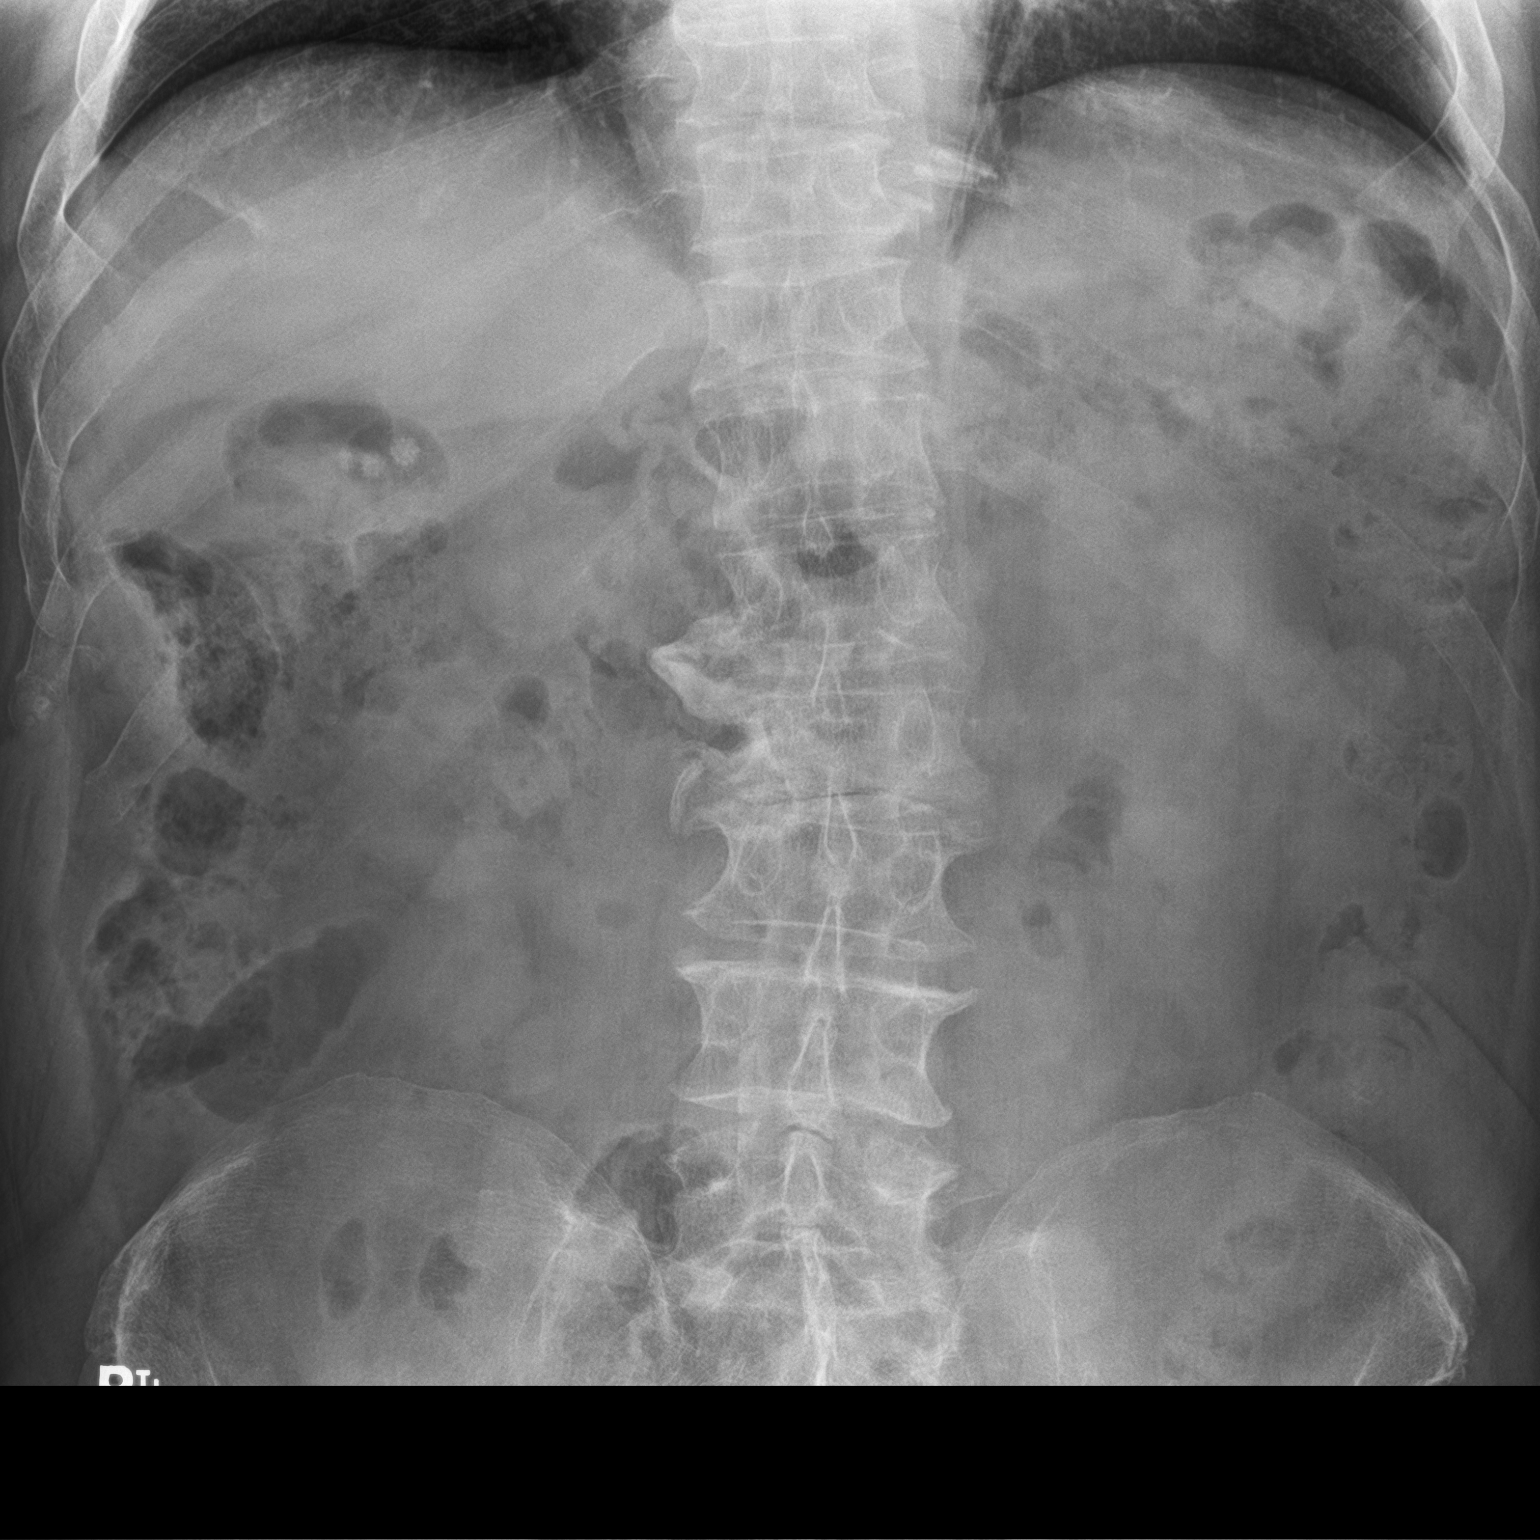
[im 2/2]
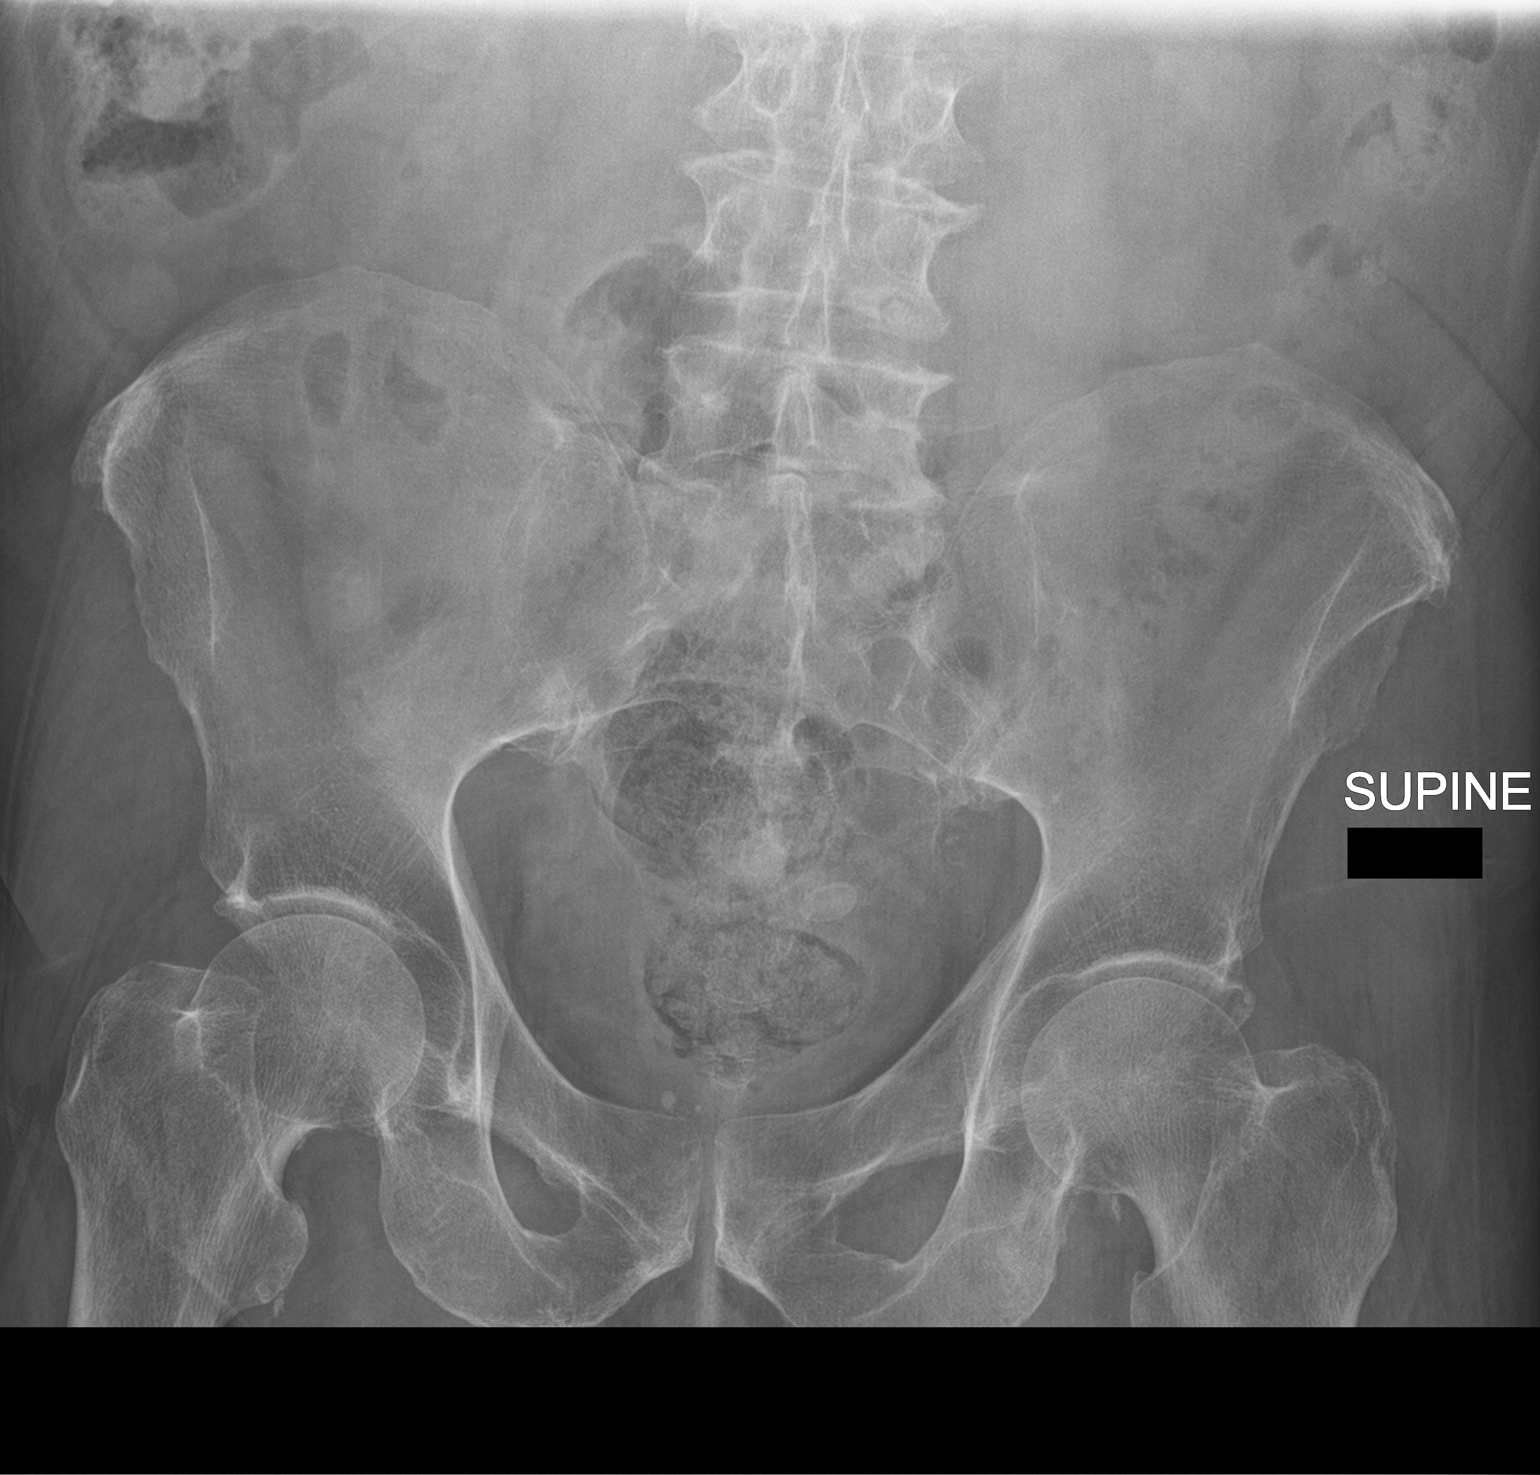

[2 of 2 positions shown; findings below may reference images not displayed]

FINDINGS: Three rounded calcifications noted the right upper quadrant. These
may represent gallstones. Ultrasound can be obtained to further
evaluate. No definite renal stone noted. Pelvic calcifications are
noted most consistent with phleboliths. Distal ureteral stone cannot
be completely excluded. Stool noted throughout the colon. No bowel
distention or free air. Degenerative changes scoliosis lumbar spine.
Degenerative changes both hips. Stable mild lower thoracic and upper
lumbar vertebral body compressions
IMPRESSION: 1. Three rounded calcifications in the right upper quadrant, these
may represent gallstones. Ultrasound can be obtained to further
evaluate.

2. No definite renal stone stones noted. Pelvic calcifications noted
most likely phleboliths.

## 2020-11-25 ENCOUNTER — Other Ambulatory Visit: Payer: Self-pay | Admitting: Urology

## 2020-11-25 DIAGNOSIS — N401 Enlarged prostate with lower urinary tract symptoms: Secondary | ICD-10-CM

## 2020-12-04 IMAGING — US US RENAL
1 series · 14 of 25 positions shown · non-contrast
Comparison: None

CLINICAL DATA: Nephrolithiasis, possible bladder calculus

EXAM:
RENAL / URINARY TRACT ULTRASOUND COMPLETE

[Series 1: us renal · 0.28mm/px · 14 of 41 slices shown]
[im 1/41]
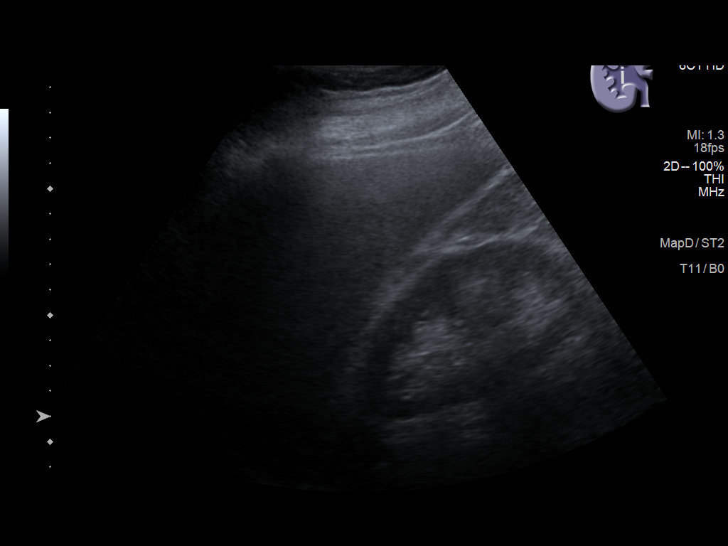
[im 4/41]
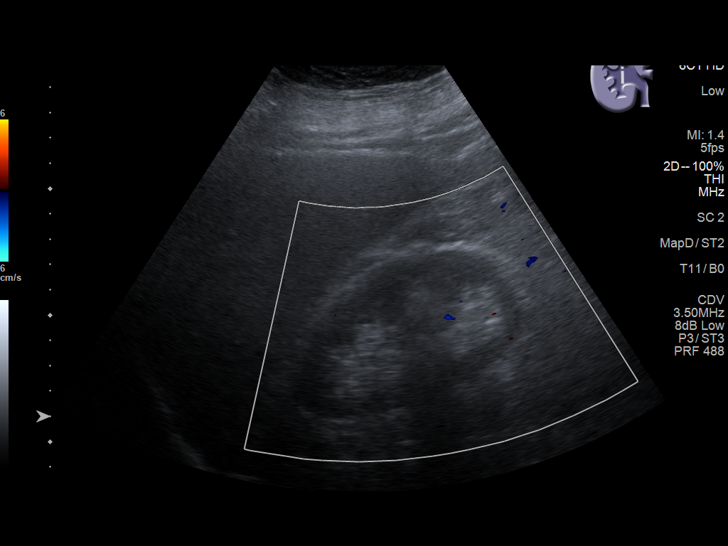
[im 7/41]
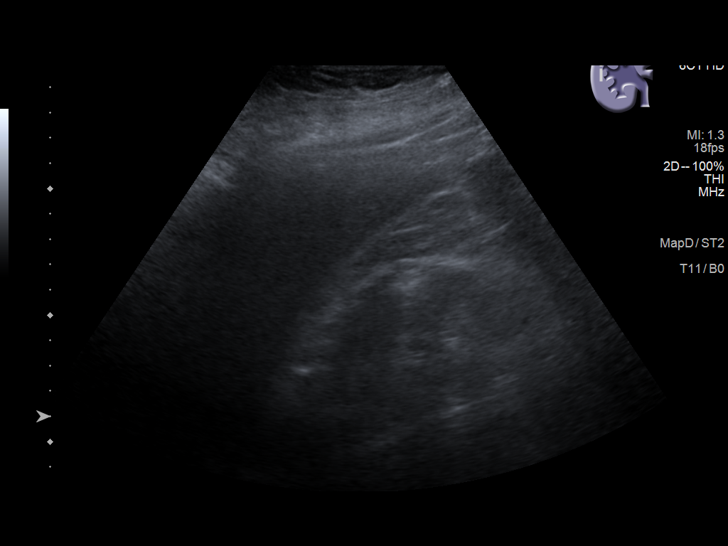
[im 11/41]
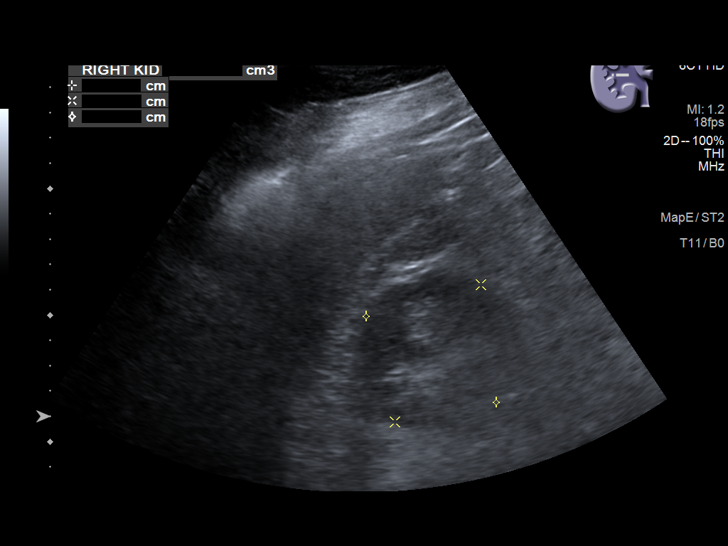
[im 14/41]
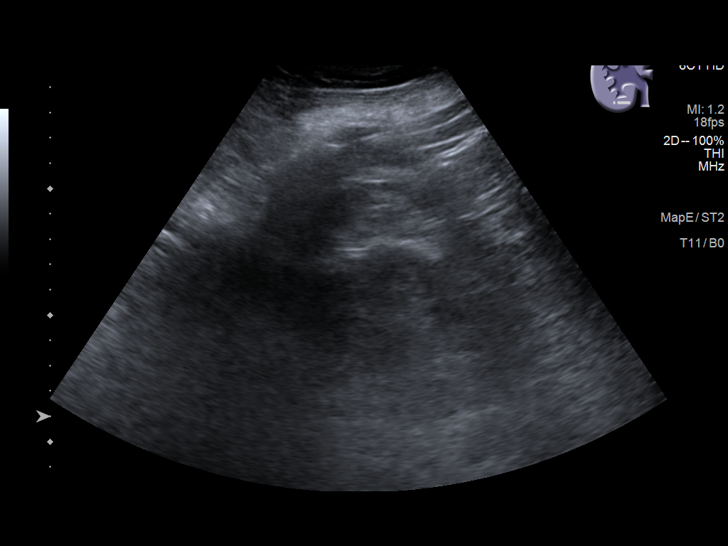
[im 16/41]
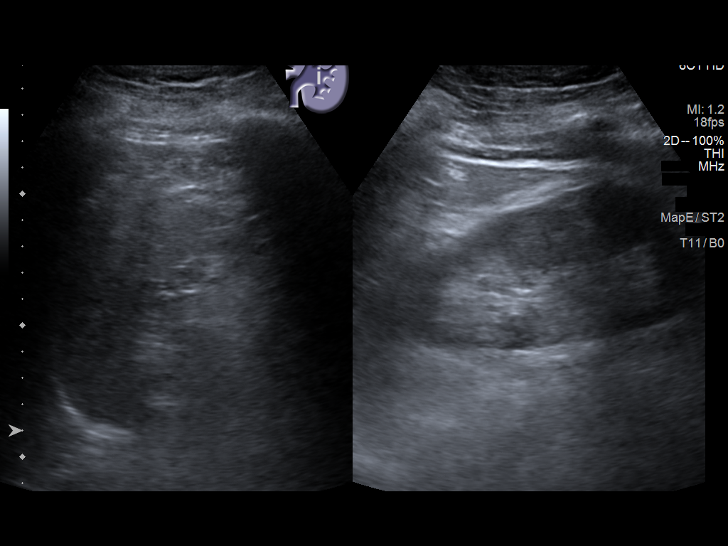
[im 19/41]
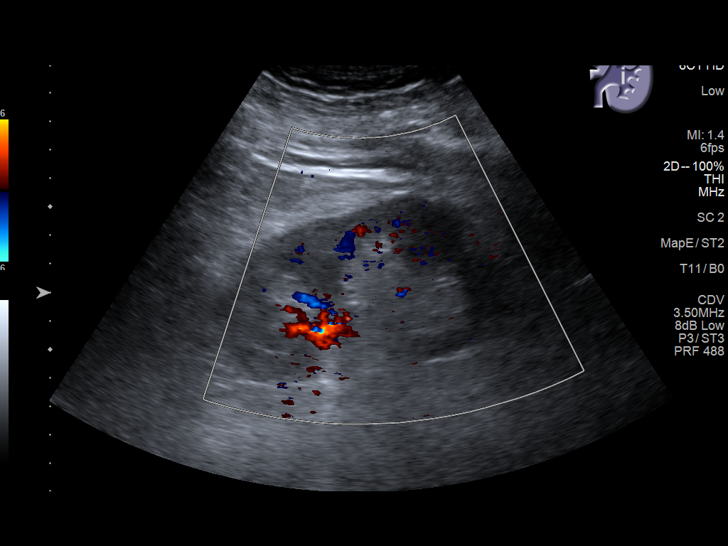
[im 22/41]
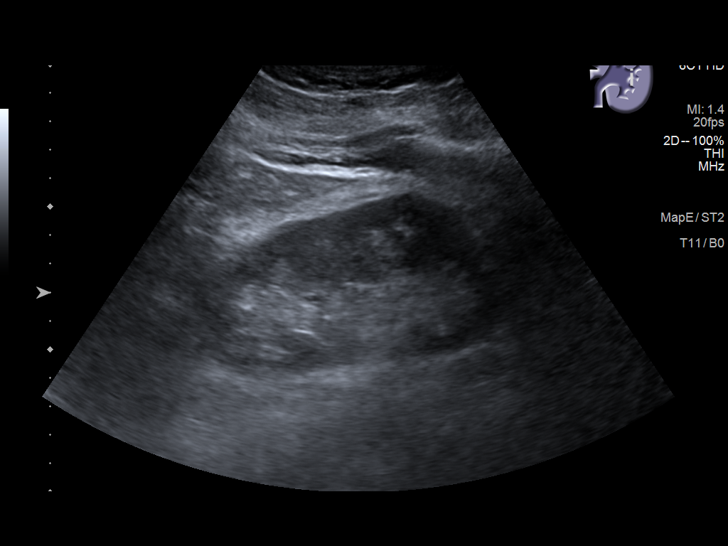
[im 26/41]
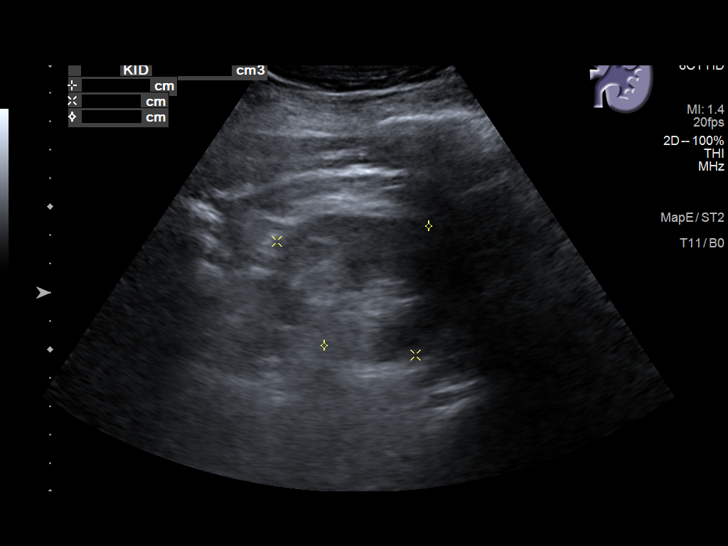
[im 27/41]
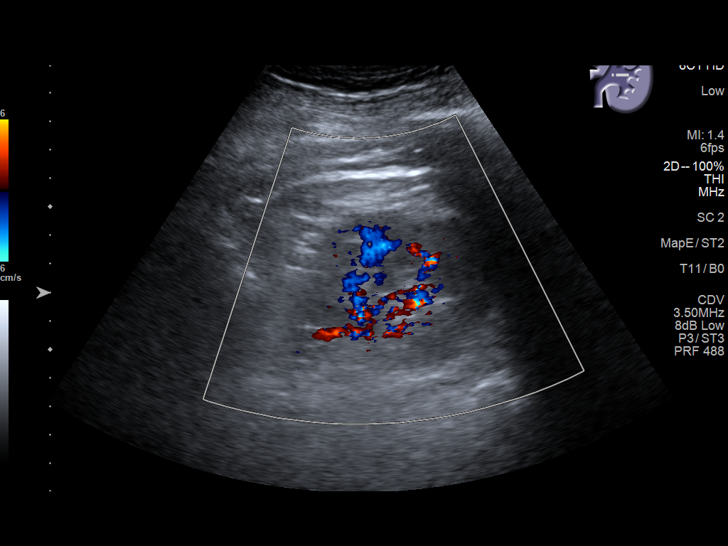
[im 31/41]
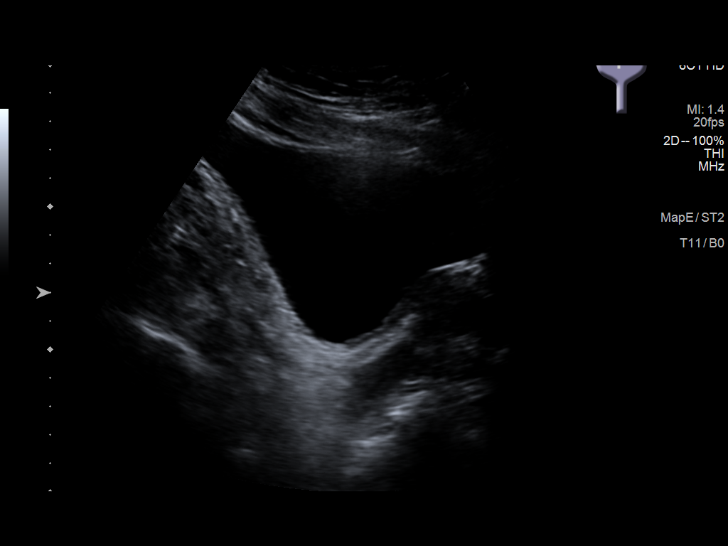
[im 34/41]
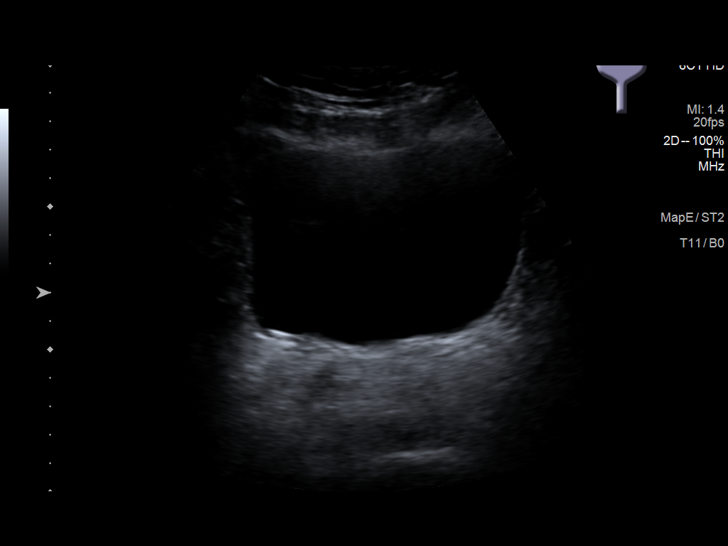
[im 37/41]
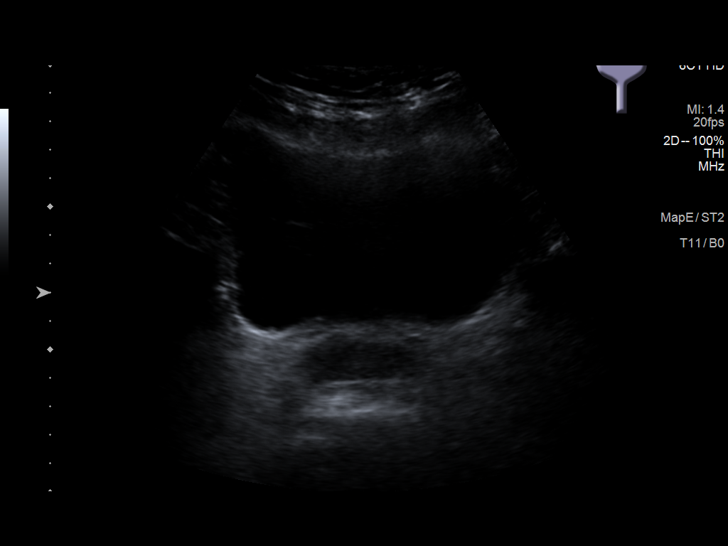
[im 41/41]
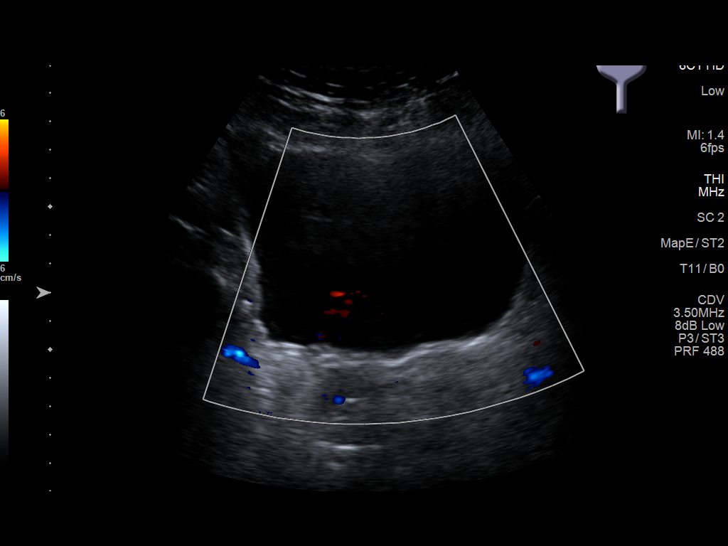

[14 of 25 positions shown; findings below may reference images not displayed]

FINDINGS: Right Kidney:

Renal measurements: 9.9 x 6.4 x 6.2 cm = volume: 204 mL. Cortical
thinning. Normal cortical echogenicity. Small cortical scar at mid
kidney. No mass, hydronephrosis or shadowing calcification.

Left Kidney:

Renal measurements: 10.2 x 6.3 x 5.6 cm = volume: 196 mL. Cortical
thinning period upper normal cortical echogenicity. No mass,
hydronephrosis or shadowing calcification.

Bladder:

Normally distended without wall thickening or mass. Dependent
echogenic focus without shadowing within the posterior bladder 7 mm
diameter, question nonshadowing calculus.
IMPRESSION: Small cortical scar mid RIGHT kidney.

Otherwise unremarkable renal ultrasound.

Question 7 mm nonshadowing calculus dependently within urinary
bladder.

## 2021-01-13 ENCOUNTER — Ambulatory Visit (INDEPENDENT_AMBULATORY_CARE_PROVIDER_SITE_OTHER): Payer: Medicare HMO | Admitting: Urology

## 2021-01-13 ENCOUNTER — Encounter: Payer: Self-pay | Admitting: Urology

## 2021-01-13 ENCOUNTER — Other Ambulatory Visit: Payer: Self-pay

## 2021-01-13 VITALS — BP 101/64 | HR 66 | Ht 72.0 in | Wt 235.0 lb

## 2021-01-13 DIAGNOSIS — N401 Enlarged prostate with lower urinary tract symptoms: Secondary | ICD-10-CM

## 2021-01-13 DIAGNOSIS — R351 Nocturia: Secondary | ICD-10-CM | POA: Diagnosis not present

## 2021-01-13 LAB — BLADDER SCAN AMB NON-IMAGING: Scan Result: 74

## 2021-01-13 NOTE — Progress Notes (Signed)
01/13/2021 9:18 AM   Paul Bass Nov 12, 1945 [id number]  Referring provider: [name], NP [address] [place],  [place] [address]  Chief Complaint  Patient presents with   Benign Prostatic Hypertrophy    Urologic history: 1.  BPH with LUTS -UroLift 04/29/2019   HPI: 75 y.o. male presents for follow-up.  Overall doing well No bothersome daytime symptoms Has elected to continue tamsulosin Still with nocturia x4 which he states is not bothersome Does have sleep apnea but could not tolerate CPAP   PMH: Past Medical History:  Diagnosis Date   Anxiety    BPH (benign prostatic hypertrophy)    CAD S/P percutaneous coronary angioplasty    COPD (chronic obstructive pulmonary disease) ([name])    Depression    Diabetes mellitus without complication ([name])    Dyspnea    GERD (gastroesophageal reflux disease)    HTN (hypertension)    Hyperlipidemia    Hypothyroidism    Stroke ([place])    Tobacco abuse     Surgical History: Past Surgical History:  Procedure Laterality Date   CARDIAC CATHETERIZATION N/A 12/07/2015   Procedure: Left Heart Cath and Coronary Angiography;  Surgeon: Corey Skains, MD;  Location: [place] CV LAB;  Service: Cardiovascular;  Laterality: N/A;   COLONOSCOPY WITH PROPOFOL N/A 02/18/2019   Procedure: COLONOSCOPY WITH PROPOFOL;  Surgeon: Lucilla Lame, MD;  Location: [facility] ENDOSCOPY;  Service: Endoscopy;  Laterality: N/A;   CORONARY ANGIOPLASTY WITH STENT PLACEMENT     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   CORONARY ARTERY BYPASS GRAFT     CYSTOSCOPY WITH INSERTION OF UROLIFT N/A 04/29/2019   Procedure: CYSTOSCOPY WITH INSERTION OF UROLIFT;  Surgeon: Abbie Sons, MD;  Location: [facility] ORS;  Service: Urology;  Laterality: N/A;   ESOPHAGOGASTRODUODENOSCOPY (EGD) WITH PROPOFOL N/A 09/11/2016   Procedure: ESOPHAGOGASTRODUODENOSCOPY (EGD) WITH PROPOFOL;  Surgeon: Lucilla Lame, MD;  Location: [facility] ENDOSCOPY;  Service: Endoscopy;  Laterality: N/A;    ESOPHAGOGASTRODUODENOSCOPY (EGD) WITH PROPOFOL N/A 10/17/2016   Procedure: ESOPHAGOGASTRODUODENOSCOPY (EGD) WITH PROPOFOL;  Surgeon: Lucilla Lame, MD;  Location: [facility] ENDOSCOPY;  Service: Endoscopy;  Laterality: N/A;   ESOPHAGOGASTRODUODENOSCOPY (EGD) WITH PROPOFOL N/A 11/13/2017   Procedure: ESOPHAGOGASTRODUODENOSCOPY (EGD) WITH PROPOFOL;  Surgeon: Lucilla Lame, MD;  Location: [facility] ENDOSCOPY;  Service: Endoscopy;  Laterality: N/A;   pins R lower leg     rotator cuff replaced     right   SAVORY DILATION N/A 10/17/2016   Procedure: SAVORY DILATION;  Surgeon: Lucilla Lame, MD;  Location: [facility] ENDOSCOPY;  Service: Endoscopy;  Laterality: N/A;    Home Medications:  Allergies as of 01/13/2021   No Known Allergies      Medication List        Accurate as of January 13, 2021  9:18 AM. If you have any questions, ask your nurse or doctor.          albuterol 108 (90 Base) MCG/ACT inhaler Commonly known as: VENTOLIN HFA Inhale 2 puffs into the lungs every 6 (six) hours as needed for wheezing or shortness of breath. For shortness of breath   aspirin EC 81 MG tablet Take 81 mg by mouth daily.   atorvastatin 80 MG tablet Commonly known as: LIPITOR Take 80 mg by mouth daily.   budesonide-formoterol 160-4.5 MCG/ACT inhaler Commonly known as: SYMBICORT Inhale 2 puffs into the lungs daily.   buPROPion 300 MG 24 hr tablet Commonly known as: WELLBUTRIN XL Take 300 mg by mouth at bedtime.   buPROPion 150  MG 24 hr tablet Commonly known as: WELLBUTRIN XL   cholecalciferol 25 MCG (1000 UNIT) tablet Commonly known as: VITAMIN D3 Take 1,000 Units by mouth every evening.   Cinnamon Bark Powd Take 1,000 mg by mouth daily.   ciprofloxacin 500 MG tablet Commonly known as: CIPRO Take 1 tablet (500 mg total) by mouth every 12 (twelve) hours.   citalopram 20 MG tablet Commonly known as: CELEXA Take 20 mg by mouth daily.   clonazePAM 0.5 MG tablet Commonly known as: KLONOPIN Take 0.5 mg by  mouth at bedtime as needed (restless leg syndrome).   clopidogrel 75 MG tablet Commonly known as: PLAVIX Take 75 mg by mouth daily.   CoQ10 100 MG Caps Take 100 mg by mouth at bedtime.   Fish Oil 1200 MG Caps Take 1,000 mg by mouth 2 (two) times daily.   glucose blood test strip   levothyroxine 100 MCG tablet Commonly known as: SYNTHROID Take 100 mcg by mouth daily before breakfast.   memantine 5 MG tablet Commonly known as: NAMENDA Take 1 tablet by mouth daily.   memantine 10 MG tablet Commonly known as: NAMENDA   metFORMIN 500 MG tablet Commonly known as: GLUCOPHAGE Take 500 mg by mouth 2 (two) times daily.   multivitamin with minerals Tabs tablet Take 0.5 tablets by mouth 2 (two) times daily.   naproxen sodium 220 MG tablet Commonly known as: ALEVE Take 220-440 mg by mouth 2 (two) times daily as needed (pain.).   pantoprazole 40 MG tablet Commonly known as: PROTONIX Take 1 tablet (40 mg total) by mouth at bedtime.   psyllium 28 % packet Commonly known as: METAMUCIL SMOOTH TEXTURE Take 1 packet by mouth daily.   RA Krill Oil 500 MG Caps Take 500 mg by mouth at bedtime.   spironolactone 25 MG tablet Commonly known as: ALDACTONE   tamsulosin 0.4 MG Caps capsule Commonly known as: FLOMAX TAKE 1 CAPSULE BY MOUTH EVERY DAY   telmisartan 40 MG tablet Commonly known as: MICARDIS Take 1 tablet by mouth 1 day or 1 dose.   Uribel 118 MG Caps Take 1 capsule (118 mg total) by mouth 3 (three) times daily as needed (Urinary frequency, urgency, burning).        Allergies: No Known Allergies  Family History: Family History  Problem Relation Age of Onset   Heart failure Father    Throat cancer Mother    Diabetes Brother     Social History:  reports that he quit smoking about 8 years ago. His smoking use included cigarettes. He smoked an average of 2.00 packs per day. He has never used smokeless tobacco. He reports previous alcohol use. He reports that he  does not use drugs.   Physical Exam: There were no vitals taken for this visit.  Constitutional:  Alert and oriented, No acute distress. HEENT: [place] AT, moist mucus membranes.  Trachea midline, no masses. Cardiovascular: No clubbing, cyanosis, or edema. Respiratory: Normal respiratory effort, no increased work of breathing.   Assessment & Plan:    1. Benign localized prostatic hyperplasia with LUTS  Stable Bladder scan PVR 74 mL Denies bothersome symptoms Continue annual follow-up  2.  Nocturia Stable Sleep apnea but unable to tolerate CPAP His nocturia is not bothersome   Abbie Sons, MD  [facility] [address] [place], [place] [address] [phone]

## 2021-01-19 DIAGNOSIS — Z8616 Personal history of COVID-19: Secondary | ICD-10-CM

## 2021-01-19 HISTORY — DX: Personal history of COVID-19: Z86.16

## 2021-02-03 DIAGNOSIS — R001 Bradycardia, unspecified: Secondary | ICD-10-CM | POA: Insufficient documentation

## 2021-02-12 ENCOUNTER — Other Ambulatory Visit: Payer: Self-pay | Admitting: Gastroenterology

## 2021-02-21 ENCOUNTER — Other Ambulatory Visit: Payer: Self-pay | Admitting: Urology

## 2021-02-21 DIAGNOSIS — N401 Enlarged prostate with lower urinary tract symptoms: Secondary | ICD-10-CM

## 2021-03-24 ENCOUNTER — Encounter: Payer: Self-pay | Admitting: Ophthalmology

## 2021-03-24 ENCOUNTER — Ambulatory Visit
Admission: RE | Admit: 2021-03-24 | Discharge: 2021-03-24 | Disposition: A | Discharge status: Home / Self Care | Payer: Medicare HMO | Attending: Ophthalmology | Admitting: Ophthalmology

## 2021-03-24 ENCOUNTER — Encounter
Admission: RE | Disposition: A | Discharge status: Home / Self Care | Payer: Self-pay | Source: Home / Self Care | Attending: Ophthalmology

## 2021-03-24 ENCOUNTER — Ambulatory Visit: Payer: Medicare HMO | Admitting: Certified Registered"

## 2021-03-24 ENCOUNTER — Other Ambulatory Visit: Payer: Self-pay

## 2021-03-24 DIAGNOSIS — Z7902 Long term (current) use of antithrombotics/antiplatelets: Secondary | ICD-10-CM | POA: Insufficient documentation

## 2021-03-24 DIAGNOSIS — Z951 Presence of aortocoronary bypass graft: Secondary | ICD-10-CM | POA: Insufficient documentation

## 2021-03-24 DIAGNOSIS — E1151 Type 2 diabetes mellitus with diabetic peripheral angiopathy without gangrene: Secondary | ICD-10-CM | POA: Insufficient documentation

## 2021-03-24 DIAGNOSIS — Z79899 Other long term (current) drug therapy: Secondary | ICD-10-CM | POA: Insufficient documentation

## 2021-03-24 DIAGNOSIS — Z7984 Long term (current) use of oral hypoglycemic drugs: Secondary | ICD-10-CM | POA: Diagnosis not present

## 2021-03-24 DIAGNOSIS — Z791 Long term (current) use of non-steroidal anti-inflammatories (NSAID): Secondary | ICD-10-CM | POA: Diagnosis not present

## 2021-03-24 DIAGNOSIS — Z7982 Long term (current) use of aspirin: Secondary | ICD-10-CM | POA: Insufficient documentation

## 2021-03-24 DIAGNOSIS — E1136 Type 2 diabetes mellitus with diabetic cataract: Secondary | ICD-10-CM | POA: Insufficient documentation

## 2021-03-24 DIAGNOSIS — Z79890 Hormone replacement therapy (postmenopausal): Secondary | ICD-10-CM | POA: Insufficient documentation

## 2021-03-24 DIAGNOSIS — H2512 Age-related nuclear cataract, left eye: Secondary | ICD-10-CM | POA: Insufficient documentation

## 2021-03-24 DIAGNOSIS — Z87891 Personal history of nicotine dependence: Secondary | ICD-10-CM | POA: Diagnosis not present

## 2021-03-24 DIAGNOSIS — Z955 Presence of coronary angioplasty implant and graft: Secondary | ICD-10-CM | POA: Diagnosis not present

## 2021-03-24 HISTORY — PX: CATARACT EXTRACTION W/PHACO: SHX586

## 2021-03-24 LAB — GLUCOSE, CAPILLARY: Glucose-Capillary: 158 mg/dL — ABNORMAL HIGH (ref 70–99)

## 2021-03-24 SURGERY — PHACOEMULSIFICATION, CATARACT, WITH IOL INSERTION
Anesthesia: Monitor Anesthesia Care | Site: Eye | Laterality: Left

## 2021-03-24 MED ORDER — MIDAZOLAM HCL 2 MG/2ML IJ SOLN
INTRAMUSCULAR | Status: DC | PRN
  Administered 2021-03-24: 1 mg via INTRAVENOUS

## 2021-03-24 MED ORDER — MOXIFLOXACIN HCL 0.5 % OP SOLN: 1.0000 [drp] | OPHTHALMIC | Status: DC | PRN

## 2021-03-24 MED ORDER — MOXIFLOXACIN HCL 0.5 % OP SOLN
OPHTHALMIC | Status: DC | PRN
  Administered 2021-03-24: 0.2 mL via OPHTHALMIC

## 2021-03-24 MED ORDER — PHENYLEPHRINE HCL 10 % OP SOLN
OPHTHALMIC | Status: AC
  Administered 2021-03-24: 1 [drp] via OPHTHALMIC
  Filled 2021-03-24: qty 5

## 2021-03-24 MED ORDER — MOXIFLOXACIN HCL 0.5 % OP SOLN
OPHTHALMIC | Status: AC
  Filled 2021-03-24: qty 3

## 2021-03-24 MED ORDER — MIDAZOLAM HCL 2 MG/2ML IJ SOLN
INTRAMUSCULAR | Status: AC
  Filled 2021-03-24: qty 2

## 2021-03-24 MED ORDER — TETRACAINE HCL 0.5 % OP SOLN: 1.0000 [drp] | Freq: Once | OPHTHALMIC | Status: AC

## 2021-03-24 MED ORDER — BRIMONIDINE TARTRATE-TIMOLOL 0.2-0.5 % OP SOLN
OPHTHALMIC | Status: DC | PRN
  Administered 2021-03-24: 1 [drp] via OPHTHALMIC

## 2021-03-24 MED ORDER — TETRACAINE HCL 0.5 % OP SOLN
1.0000 [drp] | OPHTHALMIC | Status: DC | PRN
  Administered 2021-03-24: 1 [drp] via OPHTHALMIC

## 2021-03-24 MED ORDER — PHENYLEPHRINE HCL 10 % OP SOLN
1.0000 [drp] | OPHTHALMIC | Status: AC
  Administered 2021-03-24 (×2): 1 [drp] via OPHTHALMIC

## 2021-03-24 MED ORDER — SIGHTPATH DOSE#1 NA CHONDROIT SULF-NA HYALURON 40-17 MG/ML IO SOLN
INTRAOCULAR | Status: DC | PRN
  Administered 2021-03-24: 1 mL via INTRAOCULAR

## 2021-03-24 MED ORDER — TETRACAINE HCL 0.5 % OP SOLN
OPHTHALMIC | Status: AC
  Administered 2021-03-24: 1 [drp] via OPHTHALMIC
  Filled 2021-03-24: qty 4

## 2021-03-24 MED ORDER — SODIUM CHLORIDE 0.9 % IV SOLN
INTRAVENOUS | Status: DC
  Administered 2021-03-24: 10 mL/h via INTRAVENOUS

## 2021-03-24 MED ORDER — SIGHTPATH DOSE#1 BSS IO SOLN
INTRAOCULAR | Status: DC | PRN
  Administered 2021-03-24: 2 mL

## 2021-03-24 MED ORDER — FENTANYL CITRATE (PF) 100 MCG/2ML IJ SOLN
INTRAMUSCULAR | Status: AC
  Filled 2021-03-24: qty 2

## 2021-03-24 MED ORDER — ARMC OPHTHALMIC DILATING DROPS
1.0000 "application " | OPHTHALMIC | Status: AC
  Filled 2021-03-24: qty 0.5

## 2021-03-24 MED ORDER — SIGHTPATH DOSE#1 BSS IO SOLN
INTRAOCULAR | Status: DC | PRN
  Administered 2021-03-24: 15 mL via INTRAOCULAR

## 2021-03-24 MED ORDER — CYCLOPENTOLATE HCL 2 % OP SOLN
1.0000 [drp] | OPHTHALMIC | Status: AC
  Administered 2021-03-24 (×2): 1 [drp] via OPHTHALMIC

## 2021-03-24 MED ORDER — SIGHTPATH DOSE#1 BSS IO SOLN
INTRAOCULAR | Status: DC | PRN
  Administered 2021-03-24: 72 mL via OPHTHALMIC

## 2021-03-24 MED ORDER — MOXIFLOXACIN HCL 0.5 % OP SOLN: 1.0000 [drp] | Freq: Once | OPHTHALMIC | Status: DC

## 2021-03-24 MED ORDER — CYCLOPENTOLATE HCL 2 % OP SOLN
OPHTHALMIC | Status: AC
  Administered 2021-03-24: 1 [drp] via OPHTHALMIC
  Filled 2021-03-24: qty 2

## 2021-03-24 MED ORDER — FENTANYL CITRATE (PF) 100 MCG/2ML IJ SOLN
INTRAMUSCULAR | Status: DC | PRN
  Administered 2021-03-24: 50 ug via INTRAVENOUS

## 2021-03-24 SURGICAL SUPPLY — 16 items
CANNULA ANT/CHMB 27GA (MISCELLANEOUS) ×2 IMPLANT
CATARACT SUITE MONOFOCAL ARMC (MISCELLANEOUS) ×2 IMPLANT
GLOVE SURG ENC MOIS LTX SZ8 (GLOVE) ×2 IMPLANT
GLOVE SURG ENC TEXT LTX SZ6.5 (GLOVE) ×2 IMPLANT
GLOVE SURG ENC TEXT LTX SZ8 (GLOVE) ×2 IMPLANT
GOWN STRL REUS W/ TWL LRG LVL3 (GOWN DISPOSABLE) ×2 IMPLANT
GOWN STRL REUS W/TWL LRG LVL3 (GOWN DISPOSABLE) ×2
KIT POST-OP (PACKS) ×2 IMPLANT
LENS IOL TECNIS EYHANCE 19.5 (Intraocular Lens) ×2 IMPLANT
NEEDLE FILTER BLUNT 18X 1/2SAF (NEEDLE) ×1
NEEDLE FILTER BLUNT 18X1 1/2 (NEEDLE) ×1 IMPLANT
PACK EYE AFTER SURG (MISCELLANEOUS) IMPLANT
SYR 3ML LL SCALE MARK (SYRINGE) ×2 IMPLANT
SYR TB 1ML LUER SLIP (SYRINGE) ×2 IMPLANT
WATER STERILE IRR 250ML POUR (IV SOLUTION) ×2 IMPLANT
WIPE NON LINTING 3.25X3.25 (MISCELLANEOUS) ×2 IMPLANT

## 2021-03-24 NOTE — H&P (Signed)
[facility]   Primary Care Physician:  [name], NP Ophthalmologist: Dr.Analise Glotfelty  Pre-Procedure History & Physical: HPI:  Paul Bass is a 75 y.o. male here for cataract surgery.   Past Medical History:  Diagnosis Date   Anxiety    BPH (benign prostatic hypertrophy)    CAD S/P percutaneous coronary angioplasty    COPD (chronic obstructive pulmonary disease) ([name])    Depression    Diabetes mellitus without complication ([name])    Dyspnea    GERD (gastroesophageal reflux disease)    HTN (hypertension)    Hyperlipidemia    Hypothyroidism    Stroke [facility] 2013   right side weakness   Tobacco abuse     Past Surgical History:  Procedure Laterality Date   CARDIAC CATHETERIZATION N/A 12/07/2015   Procedure: Left Heart Cath and Coronary Angiography;  Surgeon: Corey Skains, MD;  Location: [place] CV LAB;  Service: Cardiovascular;  Laterality: N/A;   COLONOSCOPY WITH PROPOFOL N/A 02/18/2019   Procedure: COLONOSCOPY WITH PROPOFOL;  Surgeon: Lucilla Lame, MD;  Location: [facility] ENDOSCOPY;  Service: Endoscopy;  Laterality: N/A;   CORONARY ANGIOPLASTY WITH STENT PLACEMENT     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   CORONARY ARTERY BYPASS GRAFT     CYSTOSCOPY WITH INSERTION OF UROLIFT N/A 04/29/2019   Procedure: CYSTOSCOPY WITH INSERTION OF UROLIFT;  Surgeon: Abbie Sons, MD;  Location: [facility] ORS;  Service: Urology;  Laterality: N/A;   ESOPHAGOGASTRODUODENOSCOPY (EGD) WITH PROPOFOL N/A 09/11/2016   Procedure: ESOPHAGOGASTRODUODENOSCOPY (EGD) WITH PROPOFOL;  Surgeon: Lucilla Lame, MD;  Location: [facility] ENDOSCOPY;  Service: Endoscopy;  Laterality: N/A;   ESOPHAGOGASTRODUODENOSCOPY (EGD) WITH PROPOFOL N/A 10/17/2016   Procedure: ESOPHAGOGASTRODUODENOSCOPY (EGD) WITH PROPOFOL;  Surgeon: Lucilla Lame, MD;  Location: [facility] ENDOSCOPY;  Service: Endoscopy;  Laterality: N/A;   ESOPHAGOGASTRODUODENOSCOPY (EGD) WITH PROPOFOL N/A 11/13/2017   Procedure: ESOPHAGOGASTRODUODENOSCOPY  (EGD) WITH PROPOFOL;  Surgeon: Lucilla Lame, MD;  Location: [facility] ENDOSCOPY;  Service: Endoscopy;  Laterality: N/A;   pins R lower leg Left    rotator cuff replaced     right   SAVORY DILATION N/A 10/17/2016   Procedure: SAVORY DILATION;  Surgeon: Lucilla Lame, MD;  Location: [facility] ENDOSCOPY;  Service: Endoscopy;  Laterality: N/A;    Prior to Admission medications   Medication Sig Start Date End Date Taking? Authorizing Provider  aspirin EC 81 MG tablet Take 81 mg by mouth daily. 02/05/20  Yes [provider]  atorvastatin (LIPITOR) 80 MG tablet Take 80 mg by mouth daily.  01/09/16  Yes [provider]  buPROPion (WELLBUTRIN XL) 150 MG 24 hr tablet Take 150 mg by mouth at bedtime. 04/07/20  Yes [provider]  cholecalciferol (VITAMIN D3) 25 MCG (1000 UT) tablet Take 1,000 Units by mouth every evening.   Yes [provider]  citalopram (CELEXA) 20 MG tablet Take 20 mg by mouth daily.   Yes [provider]  clonazePAM (KLONOPIN) 0.5 MG tablet Take 0.5 mg by mouth at bedtime as needed (restless leg syndrome).    Yes [provider]  clopidogrel (PLAVIX) 75 MG tablet Take 75 mg by mouth daily.  12/21/15  Yes [provider]  Coenzyme Q10 (COQ10) 100 MG CAPS Take 100 mg by mouth at bedtime.   Yes [provider]  empagliflozin (JARDIANCE) 10 MG TABS tablet Take 10 mg by mouth daily.   Yes [provider]  ezetimibe (ZETIA) 10 MG tablet Take 10 mg by mouth daily.   Yes  [provider]  Ferrous Sulfate (IRON) 325 (65 Fe) MG TABS Take 1 tablet by mouth daily.   Yes [provider]  levothyroxine (SYNTHROID, LEVOTHROID) 100 MCG tablet Take 100 mcg by mouth daily before breakfast.   Yes [provider]  memantine (NAMENDA) 10 MG tablet Take 10 mg by mouth 2 (two) times daily.   Yes [provider]  metFORMIN (GLUCOPHAGE) 500 MG tablet Take 500 mg by mouth 2 (two) times daily.  09/07/14  Yes  [provider]  Multiple Vitamin (MULTIVITAMIN WITH MINERALS) TABS Take 1 tablet by mouth daily.   Yes [provider]  naproxen sodium (ALEVE) 220 MG tablet Take 220-440 mg by mouth 2 (two) times daily as needed (pain.).   Yes [provider]  pantoprazole (PROTONIX) 40 MG tablet Take 1 tablet (40 mg total) by mouth at bedtime. **PLEASE SCHEDULE FOLLOW UP APPT** 02/17/21  Yes Wohl, Darren, MD  psyllium (METAMUCIL SMOOTH TEXTURE) 28 % packet Take 1 packet by mouth daily.   Yes [provider]  RA KRILL OIL 500 MG CAPS Take 500 mg by mouth at bedtime.    Yes [provider]  spironolactone (ALDACTONE) 25 MG tablet 25 mg daily. 04/12/20  Yes [provider]  tamsulosin (FLOMAX) 0.4 MG CAPS capsule TAKE 1 CAPSULE BY MOUTH EVERY DAY 02/21/21  Yes Stoioff, Ronda Fairly, MD  telmisartan (MICARDIS) 20 MG tablet Take 20 mg by mouth at bedtime.   Yes [provider]  glucose blood test strip     [provider]  metoprolol tartrate (LOPRESSOR) 25 MG tablet Take 12.5 mg by mouth 2 (two) times daily. Patient not taking: Reported on 03/24/2021    [provider]    Allergies as of 02/08/2021   (No Known Allergies)    Family History  Problem Relation Age of Onset   Heart failure Father    Throat cancer Mother    Diabetes Brother     Social History   Socioeconomic History   Marital status: Married    Spouse name: Vaughan Basta   Number of children: 1   Years of education: HS   Highest education level: Not on file  Occupational History   Occupation: Retired    Comment: Part Time [employer]  Tobacco Use   Smoking status: Former    Packs/day: 2.00    Types: Cigarettes    Quit date: 05/20/2012    Years since quitting: 8.8   Smokeless tobacco: Never  Vaping Use   Vaping Use: Never used  Substance and Sexual Activity   Alcohol use: Not Currently    Comment: weekend drinker, had 5 beers on 05/11/12, prior to stroke, No  alcohol since stroke   Drug use: Never   Sexual activity: Yes    Birth control/protection: None  Other Topics Concern   Not on file  Social History Narrative   Patient lives at home with his wife works at  [employer] has a 12 grade education with 1 child.   Patient quit smoking 05-11-2012 patient drinks alcoholic drinks he also drinks cafinted drinks daily.   Social Determinants of Health   Financial Resource Strain: Not on file  Food Insecurity: Not on file  Transportation Needs: Not on file  Physical Activity: Not on file  Stress: Not on file  Social Connections: Not on file  Intimate Partner Violence: Not on file    Review of Systems: See HPI, otherwise negative ROS  Physical Exam: BP 125/70  Pulse 70   Resp 14   Ht '5\' 11"'$  (1.803 m)   Wt 98 kg   SpO2 100%   BMI 30.13 kg/m  General:   Alert, cooperative in NAD Head:  Normocephalic and atraumatic. Respiratory:  Normal work of breathing. Cardiovascular:  RRR  Impression/Plan: Paul Bass is here for cataract surgery.  Risks, benefits, limitations, and alternatives regarding cataract surgery have been reviewed with the patient.  Questions have been answered.  All parties agreeable.   Birder Robson, MD  03/24/2021, 9:36 AM

## 2021-03-24 NOTE — Anesthesia Preprocedure Evaluation (Signed)
Anesthesia Evaluation  Patient identified by MRN, date of birth, ID band Patient awake    Reviewed: Allergy & Precautions, NPO status , Patient's Chart, lab work & pertinent test results  History of Anesthesia Complications Negative for: history of anesthetic complications  Airway Mallampati: III       Dental  (+) Upper Dentures   Pulmonary neg shortness of breath, neg sleep apnea, COPD,  COPD inhaler, neg recent URI, former smoker,    Pulmonary exam normal        Cardiovascular hypertension, Pt. on medications (-) angina+ CAD, + CABG and + Peripheral Vascular Disease  (-) CHF Normal cardiovascular exam(-) dysrhythmias (-) Valvular Problems/Murmurs     Neuro/Psych neg Seizures PSYCHIATRIC DISORDERS Anxiety Depression CVA (R side weakness, speech difficulties), Residual Symptoms    GI/Hepatic Neg liver ROS, GERD  ,  Endo/Other  diabetes, Type 2, Oral Hypoglycemic AgentsHypothyroidism   Renal/GU negative Renal ROS     Musculoskeletal  (+) Arthritis , Osteoarthritis,    Abdominal   Peds  Hematology   Anesthesia Other Findings Past Medical History: No date: Anxiety No date: BPH (benign prostatic hypertrophy) No date: CAD S/P percutaneous coronary angioplasty No date: COPD (chronic obstructive pulmonary disease) ([name]) No date: Depression No date: Diabetes mellitus without complication ([name]) No date: Dyspnea No date: GERD (gastroesophageal reflux disease) No date: HTN (hypertension) No date: Hyperlipidemia No date: Hypothyroidism No date: Stroke ([place]) No date: Tobacco abuse   Reproductive/Obstetrics                            Anesthesia Physical  Anesthesia Plan  ASA: 3  Anesthesia Plan: MAC   Post-op Pain Management:    Induction: Intravenous  PONV Risk Score and Plan: 2 and Midazolam  Airway Management Planned: Natural Airway and Nasal Cannula  Additional Equipment:    Intra-op Plan:   Post-operative Plan: Extubation in OR  Informed Consent: I have reviewed the patients History and Physical, chart, labs and discussed the procedure including the risks, benefits and alternatives for the proposed anesthesia with the patient or authorized representative who has indicated his/her understanding and acceptance.       Plan Discussed with: CRNA, Anesthesiologist and Surgeon  Anesthesia Plan Comments:        Anesthesia Quick Evaluation

## 2021-03-24 NOTE — Discharge Instructions (Signed)
AMBULATORY SURGERY  ?DISCHARGE INSTRUCTIONS ? ? ?The drugs that you were given will stay in your system until tomorrow so for the next 24 hours you should not: ? ?Drive an automobile ?Make any legal decisions ?Drink any alcoholic beverage ? ? ?You may resume regular meals tomorrow.  Today it is better to start with liquids and gradually work up to solid foods. ? ?You may eat anything you prefer, but it is better to start with liquids, then soup and crackers, and gradually work up to solid foods. ? ? ?Please notify your doctor immediately if you have any unusual bleeding, trouble breathing, redness and pain at the surgery site, drainage, fever, or pain not relieved by medication. ? ? ? ?Additional Instructions: ? ? ? ?Please contact your physician with any problems or Same Day Surgery at [phone], Monday through Friday 6 am to 4 pm, or [facility] Main number at [phone].  ?

## 2021-03-24 NOTE — Anesthesia Postprocedure Evaluation (Signed)
Anesthesia Post Note  Patient: Paul Bass  Procedure(s) Performed: CATARACT EXTRACTION PHACO AND INTRAOCULAR LENS PLACEMENT (IOC) LEFT DIABETIC 6.67 00:42.5 (Left: Eye)  Patient location during evaluation: Phase II Anesthesia Type: MAC Level of consciousness: awake and awake and alert Pain management: pain level controlled Vital Signs Assessment: post-procedure vital signs reviewed and stable Cardiovascular status: blood pressure returned to baseline and stable Postop Assessment: no apparent nausea or vomiting Anesthetic complications: no   No notable events documented.   Last Vitals:  Vitals:   03/24/21 0848  BP: 125/70  Pulse: 70  Resp: 14  SpO2: 100%    Last Pain:  Vitals:   03/24/21 0848  PainSc: 0-No pain                 Paul Bass

## 2021-03-24 NOTE — Transfer of Care (Signed)
Immediate Anesthesia Transfer of Care Note  Patient: Paul Bass  Procedure(s) Performed: CATARACT EXTRACTION PHACO AND INTRAOCULAR LENS PLACEMENT (IOC) LEFT DIABETIC 6.67 00:42.5 (Left: Eye)  Patient Location: PACU  Anesthesia Type:MAC  Level of Consciousness: awake  Airway & Oxygen Therapy: Patient Spontanous Breathing  Post-op Assessment: Report given to RN  Post vital signs: Reviewed and stable  Last Vitals:  Vitals Value Taken Time  BP    Temp    Pulse    Resp    SpO2      Last Pain:  Vitals:   03/24/21 0848  PainSc: 0-No pain      Patients Stated Pain Goal: 0 (32/41/99 1444)  Complications: No notable events documented.

## 2021-03-24 NOTE — Op Note (Signed)
PREOPERATIVE DIAGNOSIS:  Nuclear sclerotic cataract of the left eye.   POSTOPERATIVE DIAGNOSIS:  Nuclear sclerotic cataract of the left eye.   OPERATIVE PROCEDURE:ORPROCALL@   SURGEON:  Paul Robson, MD.   ANESTHESIA:  Anesthesiologist: Phill Mutter, MD CRNA: Biagio Borg, CRNA  1.      Managed anesthesia care. 2.     0.55m of Shugarcaine was instilled following the paracentesis   COMPLICATIONS:  None.   TECHNIQUE:   Stop and chop   DESCRIPTION OF PROCEDURE:  The patient was examined and consented in the preoperative holding area where the aforementioned topical anesthesia was applied to the left eye and then brought back to the Operating Room where the left eye was prepped and draped in the usual sterile ophthalmic fashion and a lid speculum was placed. A paracentesis was created with the side port blade and the anterior chamber was filled with viscoelastic. A near clear corneal incision was performed with the steel keratome. A continuous curvilinear capsulorrhexis was performed with a cystotome followed by the capsulorrhexis forceps. Hydrodissection and hydrodelineation were carried out with BSS on a blunt cannula. The lens was removed in a stop and chop  technique and the remaining cortical material was removed with the irrigation-aspiration handpiece. The capsular bag was inflated with viscoelastic and the Technis [id number] lens was placed in the capsular bag without complication. The remaining viscoelastic was removed from the eye with the irrigation-aspiration handpiece. The wounds were hydrated. The anterior chamber was flushed with BSS and the eye was inflated to physiologic pressure. 0.19mVigamox was placed in the anterior chamber. The wounds were found to be water tight. The eye was dressed with Combigan. The patient was given protective glasses to wear throughout the day and a shield with which to sleep tonight. The patient was also given drops with which to begin a drop  regimen today and will follow-up with me in one day. Implant Name Type Inv. Item Serial No. Manufacturer Lot No. LRB No. Used Action  LENS IOL TECNIS EYHANCE 19.5 - [id number] Lens LENS IOL TECNIS EYHANCE 19.5 [id number]   Left 1 Implanted    Procedure(s): CATARACT EXTRACTION PHACO AND INTRAOCULAR LENS PLACEMENT (IOC) LEFT DIABETIC 6.67 00:42.5 (Left)  Electronically signed: WiBirder Bass/25/2022 10:07 AM

## 2021-03-25 ENCOUNTER — Encounter: Payer: Self-pay | Admitting: Ophthalmology

## 2021-05-16 ENCOUNTER — Ambulatory Visit: Payer: Self-pay | Admitting: Urology

## 2021-05-30 ENCOUNTER — Other Ambulatory Visit: Payer: Self-pay | Admitting: Family Medicine

## 2021-05-30 DIAGNOSIS — N401 Enlarged prostate with lower urinary tract symptoms: Secondary | ICD-10-CM

## 2021-05-30 MED ORDER — TAMSULOSIN HCL 0.4 MG PO CAPS: 0.4000 mg | ORAL_CAPSULE | Freq: Every day | ORAL | 0 refills | Status: DC

## 2021-08-04 ENCOUNTER — Encounter: Payer: Self-pay | Admitting: Licensed Clinical Social Worker

## 2021-08-04 ENCOUNTER — Ambulatory Visit
Admission: EM | Admit: 2021-08-04 | Discharge: 2021-08-04 | Disposition: A | Discharge status: Home / Self Care | Payer: Medicare HMO | Attending: Internal Medicine | Admitting: Internal Medicine

## 2021-08-04 ENCOUNTER — Other Ambulatory Visit: Payer: Self-pay

## 2021-08-04 DIAGNOSIS — N3000 Acute cystitis without hematuria: Secondary | ICD-10-CM | POA: Insufficient documentation

## 2021-08-04 LAB — URINALYSIS, COMPLETE (UACMP) WITH MICROSCOPIC
Bilirubin Urine: NEGATIVE
Glucose, UA: >1000 mg/dL — AB
Hgb urine dipstick: NEGATIVE
Ketones, ur: NEGATIVE mg/dL
Nitrite: POSITIVE — AB
Protein, ur: NEGATIVE mg/dL
Specific Gravity, Urine: <1.005 — ABNORMAL LOW (ref 1.005–1.030)
WBC, UA: >50 WBC/hpf (ref 0–5)
pH: 6 (ref 5.0–8.0)

## 2021-08-04 MED ORDER — SULFAMETHOXAZOLE-TRIMETHOPRIM 800-160 MG PO TABS: 1.0000 | ORAL_TABLET | Freq: Two times a day (BID) | ORAL | 0 refills | Status: AC

## 2021-08-04 NOTE — Discharge Instructions (Signed)
We are sending your urine for a culture and if we need to change the medication, we will call you.

## 2021-08-04 NOTE — ED Triage Notes (Signed)
Pt c/o urine frequncy x 3 days. Enlarge prostate, currently taking flomax with no help. Pt had a uro lift procedure last year and sxs worsened.

## 2021-08-04 NOTE — ED Provider Notes (Signed)
[facility]    CSN: [id number] Arrival date & time: 08/04/21  1548      History   Chief Complaint Chief Complaint  Patient presents with   Urinary Frequency    HPI VERSHAWN WESTRUP is a 76 y.o. male who presents due to having urinary frequency x 3 days hospital as hx of BPH and is no flomax and is not helping. His glucose has been running in the 120's. Has noticed cloudy urine. Denies dysuria.      Past Medical History:  Diagnosis Date   Anxiety    BPH (benign prostatic hypertrophy)    CAD S/P percutaneous coronary angioplasty    COPD (chronic obstructive pulmonary disease) ([name])    Depression    Diabetes mellitus without complication ([name])    Dyspnea    GERD (gastroesophageal reflux disease)    HTN (hypertension)    Hyperlipidemia    Hypothyroidism    Stroke [facility]) 2013   right side weakness   Tobacco abuse     Patient Active Problem List   Diagnosis Date Noted   Abnormal ECG 04/21/2019   Encounter for screening colonoscopy    Polyp of colon    Atherosclerotic heart disease of native coronary artery without angina pectoris 01/14/2019   Depression 01/14/2019   GERD (gastroesophageal reflux disease) 01/14/2019   Periodic limb movement disorder (PLMD) 01/14/2019   Sleep apnea 01/14/2019   Type 2 diabetes mellitus without complications ([place]) 75/04/2584   Stricture and stenosis of esophagus    Problems with swallowing and mastication    Food impaction of esophagus    H/O coronary artery bypass surgery 01/17/2016   Cerebrovascular accident (CVA) due to embolism ([place]) 01/03/2016   Hypothyroidism, unspecified 01/03/2016   HLD (hyperlipidemia) 01/03/2016   Stable angina ([place]) 11/29/2015   Benign essential hypertension 05/27/2015   Bilateral carotid artery stenosis 02/24/2015   Atherosclerotic peripheral vascular disease ([place]) 09/07/2014   Bladder calculus 08/12/2014   Primary osteoarthritis of left knee 03/10/2014   Elevated prostate specific antigen  (PSA) 02/05/2013   Snoring 01/22/2013   Sleepiness 01/22/2013   Dysphagia, unspecified(787.20) 01/22/2013   Occlusion and stenosis of carotid artery without mention of cerebral infarction 01/22/2013   Cerebral thrombosis with cerebral infarction ([place]) 01/22/2013   Benign prostatic hyperplasia with incomplete bladder emptying 08/26/2012   Chronic prostatitis 08/26/2012   Family history of malignant neoplasm of prostate 08/26/2012   Incomplete emptying of bladder 08/26/2012   Dysphagia 05/14/2012   Global aphasia 05/14/2012   Stroke, acute, thrombotic ([place]) 05/12/2012   Symptomatic carotid artery stenosis 05/12/2012   HTN (hypertension) 05/12/2012   COPD (chronic obstructive pulmonary disease) ([place]) 05/12/2012   Acute respiratory failure ([place]) 05/12/2012    Past Surgical History:  Procedure Laterality Date   CARDIAC CATHETERIZATION N/A 12/07/2015   Procedure: Left Heart Cath and Coronary Angiography;  Surgeon: Corey Skains, MD;  Location: [place] [facility];  Service: Cardiovascular;  Laterality: N/A;   CATARACT EXTRACTION W/PHACO Left 03/24/2021   Procedure: CATARACT EXTRACTION PHACO AND INTRAOCULAR LENS PLACEMENT (IOC) LEFT DIABETIC 6.67 00:42.5;  Surgeon: Birder Robson, MD;  Location: [facility] ORS;  Service: Ophthalmology;  Laterality: Left;   COLONOSCOPY WITH PROPOFOL N/A 02/18/2019   Procedure: COLONOSCOPY WITH PROPOFOL;  Surgeon: Lucilla Lame, MD;  Location: [facility] ENDOSCOPY;  Service: Endoscopy;  Laterality: N/A;   CORONARY ANGIOPLASTY WITH STENT PLACEMENT     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   CORONARY ARTERY BYPASS GRAFT     CYSTOSCOPY WITH  INSERTION OF UROLIFT N/A 04/29/2019   Procedure: CYSTOSCOPY WITH INSERTION OF UROLIFT;  Surgeon: Abbie Sons, MD;  Location: [facility] ORS;  Service: Urology;  Laterality: N/A;   ESOPHAGOGASTRODUODENOSCOPY (EGD) WITH PROPOFOL N/A 09/11/2016   Procedure: ESOPHAGOGASTRODUODENOSCOPY (EGD) WITH PROPOFOL;  Surgeon: Lucilla Lame, MD;  Location:  [facility] ENDOSCOPY;  Service: Endoscopy;  Laterality: N/A;   ESOPHAGOGASTRODUODENOSCOPY (EGD) WITH PROPOFOL N/A 10/17/2016   Procedure: ESOPHAGOGASTRODUODENOSCOPY (EGD) WITH PROPOFOL;  Surgeon: Lucilla Lame, MD;  Location: [facility] ENDOSCOPY;  Service: Endoscopy;  Laterality: N/A;   ESOPHAGOGASTRODUODENOSCOPY (EGD) WITH PROPOFOL N/A 11/13/2017   Procedure: ESOPHAGOGASTRODUODENOSCOPY (EGD) WITH PROPOFOL;  Surgeon: Lucilla Lame, MD;  Location: [facility] ENDOSCOPY;  Service: Endoscopy;  Laterality: N/A;   pins R lower leg Left    rotator cuff replaced     right   SAVORY DILATION N/A 10/17/2016   Procedure: SAVORY DILATION;  Surgeon: Lucilla Lame, MD;  Location: [facility] ENDOSCOPY;  Service: Endoscopy;  Laterality: N/A;       Home Medications    Prior to Admission medications   Medication Sig Start Date End Date Taking? Authorizing Provider  aspirin EC 81 MG tablet Take 81 mg by mouth daily. 02/05/20  Yes [provider]  atorvastatin (LIPITOR) 80 MG tablet Take 80 mg by mouth daily.  01/09/16  Yes [provider]  buPROPion (WELLBUTRIN XL) 150 MG 24 hr tablet Take 150 mg by mouth at bedtime. 04/07/20  Yes [provider]  cholecalciferol (VITAMIN D3) 25 MCG (1000 UT) tablet Take 1,000 Units by mouth every evening.   Yes [provider]  clonazePAM (KLONOPIN) 0.5 MG tablet Take 0.5 mg by mouth at bedtime as needed (restless leg syndrome).    Yes [provider]  clopidogrel (PLAVIX) 75 MG tablet Take 75 mg by mouth daily.  12/21/15  Yes [provider]  empagliflozin (JARDIANCE) 10 MG TABS tablet Take 10 mg by mouth daily.   Yes [provider]  ezetimibe (ZETIA) 10 MG tablet Take 10 mg by mouth daily.   Yes [provider]  Ferrous Sulfate (IRON) 325 (65 Fe) MG TABS Take 1 tablet by mouth daily.   Yes [provider]  glucose blood test strip    Yes [provider]  levothyroxine (SYNTHROID, LEVOTHROID) 100 MCG tablet Take 100  mcg by mouth daily before breakfast.   Yes [provider]  metFORMIN (GLUCOPHAGE) 500 MG tablet Take 500 mg by mouth 2 (two) times daily.  09/07/14  Yes [provider]  metoprolol tartrate (LOPRESSOR) 25 MG tablet Take 25 mg by mouth 2 (two) times daily.   Yes [provider]  Multiple Vitamin (MULTIVITAMIN WITH MINERALS) TABS Take 1 tablet by mouth daily.   Yes [provider]  naproxen sodium (ALEVE) 220 MG tablet Take 220-440 mg by mouth 2 (two) times daily as needed (pain.).   Yes [provider]  pantoprazole (PROTONIX) 40 MG tablet Take 1 tablet (40 mg total) by mouth at bedtime. **PLEASE SCHEDULE FOLLOW UP APPT** 02/17/21  Yes Wohl, Darren, MD  psyllium (METAMUCIL SMOOTH TEXTURE) 28 % packet Take 1 packet by mouth daily.   Yes [provider]  RA KRILL OIL 500 MG CAPS Take 500 mg by mouth at bedtime.    Yes [provider]  spironolactone (ALDACTONE) 25 MG tablet 25 mg daily. 04/12/20  Yes [provider]  tamsulosin (FLOMAX) 0.4 MG CAPS capsule Take 1 capsule (0.4 mg total) by mouth daily. 05/30/21  Yes Stoioff, Ronda Fairly,  MD  venlafaxine XR (EFFEXOR-XR) 150 MG 24 hr capsule Take 150 mg by mouth daily. 06/29/21  Yes [provider]  citalopram (CELEXA) 20 MG tablet Take 20 mg by mouth daily.    [provider]  Coenzyme Q10 (COQ10) 100 MG CAPS Take 100 mg by mouth at bedtime.    [provider]  memantine (NAMENDA) 10 MG tablet Take 10 mg by mouth 2 (two) times daily.    [provider]  telmisartan (MICARDIS) 20 MG tablet Take 20 mg by mouth at bedtime.    [provider]    Family History Family History  Problem Relation Age of Onset   Heart failure Father    Throat cancer Mother    Diabetes Brother     Social History Social History   Tobacco Use   Smoking status: Former    Packs/day: 2.00    Types: Cigarettes    Quit date: 05/20/2012    Years since quitting: 9.2    Smokeless tobacco: Never  Vaping Use   Vaping Use: Never used  Substance Use Topics   Alcohol use: Not Currently    Comment: weekend drinker, had 5 beers on 05/11/12, prior to stroke, No alcohol since stroke   Drug use: Never     Allergies   Patient has no known allergies.   Review of Systems Review of Systems  Constitutional:  Negative for fever.  Genitourinary:  Positive for frequency. Negative for difficulty urinating, dysuria, flank pain, hematuria and urgency.       + cloudy urine    Physical Exam Triage Vital Signs ED Triage Vitals [08/04/21 1633]  Enc Vitals Group     BP 116/81     Pulse Rate 77     Resp 16     Temp      Temp Source Oral     SpO2 97 %     Weight      Height      Head Circumference      Peak Flow      Pain Score      Pain Loc      Pain Edu?      Excl. in [place]?    No data found.  Updated Vital Signs BP 116/81 (BP Location: Left Arm)    Pulse 77    Resp 16    SpO2 97%   Visual Acuity Right Eye Distance:   Left Eye Distance:   Bilateral Distance:    Right Eye Near:   Left Eye Near:    Bilateral Near:     Physical Exam  Physical Exam Vitals and nursing note reviewed.  Constitutional:      General: She is not in acute distress.    Appearance: She is not toxic-appearing.  HENT:     Head: Normocephalic.     Right Ear: External ear normal.     Left Ear: External ear normal.  Eyes:     General: No scleral icterus.    Conjunctiva/sclera: Conjunctivae normal.  Pulmonary:     Effort: Pulmonary effort is normal.  Abdominal:     General: Bowel sounds are normal.     Palpations: Abdomen is soft. There is no mass.     Tenderness: There is no guarding or rebound.     Comments: - CVA tenderness   Musculoskeletal:        General: Normal range of motion.     Cervical back: Neck supple.  Skin:    General: Skin is warm and dry.     Findings: No rash.  Neurological:     Mental Status: She is alert and oriented to person, place,  and time.     Gait: Gait normal.  Psychiatric:        Mood and Affect: Mood normal.        Behavior: Behavior normal.        Thought Content: Thought content normal.        Judgment: Judgment normal.   UC Treatments / Results  Labs (all labs ordered are listed, but only abnormal results are displayed) Labs Reviewed  URINALYSIS, COMPLETE (UACMP) WITH MICROSCOPIC    EKG   Radiology No results found.  Procedures Procedures (including critical care time)  Medications Ordered in UC Medications - No data to display  Initial Impression / Assessment and Plan / UC Course  I have reviewed the triage vital signs and the nursing notes. Pertinent labs  results that were available during my care of the patient were reviewed by me and considered in my medical decision making (see chart for details). Has UTI Urine was sent out for a culture and we will inform him if we need to change his antibiotic  I placed him on Bactrim DS as noted.   Final Clinical Impressions(s) / UC Diagnoses   Final diagnoses:  None   Discharge Instructions   None    ED Prescriptions   None    PDMP not reviewed this encounter.   Shelby Mattocks, PA-C 08/04/21 1708

## 2021-08-07 LAB — URINE CULTURE: Culture: 70000 — AB

## 2021-08-08 ENCOUNTER — Telehealth (HOSPITAL_COMMUNITY): Payer: Self-pay | Admitting: Emergency Medicine

## 2021-08-08 MED ORDER — CIPROFLOXACIN HCL 500 MG PO TABS: 500.0000 mg | ORAL_TABLET | Freq: Two times a day (BID) | ORAL | 0 refills | Status: AC

## 2021-08-22 ENCOUNTER — Other Ambulatory Visit: Payer: Self-pay | Admitting: Gastroenterology

## 2021-08-31 ENCOUNTER — Telehealth: Payer: Self-pay

## 2021-08-31 NOTE — Telephone Encounter (Signed)
Patient called I made an appointment for him to come in to be seen

## 2021-08-31 NOTE — Telephone Encounter (Signed)
Lmovm requesting pt r/t my call to schedule a f/u appt... Last OV 10/2019 must be seen for further refills... I will send in refill until upcoming appt

## 2021-09-02 ENCOUNTER — Other Ambulatory Visit: Payer: Self-pay

## 2021-09-02 MED ORDER — PANTOPRAZOLE SODIUM 40 MG PO TBEC: 40.0000 mg | DELAYED_RELEASE_TABLET | Freq: Every day | ORAL | 0 refills | Status: DC

## 2021-09-05 ENCOUNTER — Ambulatory Visit: Payer: Medicare HMO | Admitting: Urology

## 2021-09-05 ENCOUNTER — Encounter: Payer: Self-pay | Admitting: Urology

## 2021-09-05 ENCOUNTER — Other Ambulatory Visit: Payer: Self-pay

## 2021-09-05 VITALS — BP 135/74 | HR 86 | Ht 71.0 in | Wt 210.0 lb

## 2021-09-05 DIAGNOSIS — N390 Urinary tract infection, site not specified: Secondary | ICD-10-CM | POA: Diagnosis not present

## 2021-09-05 DIAGNOSIS — R338 Other retention of urine: Secondary | ICD-10-CM

## 2021-09-05 DIAGNOSIS — R3914 Feeling of incomplete bladder emptying: Secondary | ICD-10-CM | POA: Diagnosis not present

## 2021-09-05 DIAGNOSIS — N401 Enlarged prostate with lower urinary tract symptoms: Secondary | ICD-10-CM

## 2021-09-05 LAB — URINALYSIS, COMPLETE
Bilirubin, UA: NEGATIVE
Glucose, UA: NEGATIVE
Ketones, UA: NEGATIVE
Nitrite, UA: NEGATIVE
Protein,UA: NEGATIVE
RBC, UA: NEGATIVE
Specific Gravity, UA: 1.015 (ref 1.005–1.030)
Urobilinogen, Ur: 0.2 mg/dL (ref 0.2–1.0)
pH, UA: 6.5 (ref 5.0–7.5)

## 2021-09-05 LAB — MICROSCOPIC EXAMINATION: Bacteria, UA: NONE SEEN

## 2021-09-05 LAB — BLADDER SCAN AMB NON-IMAGING: Scan Result: 167

## 2021-09-05 NOTE — Progress Notes (Signed)
09/05/2021 9:07 AM   [name] [birth year] [id number]  Referring provider: [name], NP [address] [place],   [address]  Chief Complaint  Patient presents with   Benign Prostatic Hypertrophy    Urologic history: 1.  BPH with LUTS -UroLift 04/29/2019  HPI: 76 y.o. male presents for evaluation of lower urinary tract symptoms  Onset worsening voiding symptoms early January 2023 with symptoms of frequency, urgency, weak urinary stream and urge incontinence Seen in ED 08/04/2021; urine culture positive for Pseudomonas and he was treated with Septra DS Saw PCP 08/24/2021 with persistent symptoms.  UA with pyuria and urine culture grew Pseudomonas.  Started on Cipro which he has continued. Voiding symptoms have not improved on antibiotics Denies fever, chills, gross hematuria Tamsulosin titrated when symptoms worsen to 0.8 mg   PMH: Past Medical History:  Diagnosis Date   Anxiety    BPH (benign prostatic hypertrophy)    CAD S/P percutaneous coronary angioplasty    COPD (chronic obstructive pulmonary disease) ([name])    Depression    Diabetes mellitus without complication ([name])    Dyspnea    GERD (gastroesophageal reflux disease)    HTN (hypertension)    Hyperlipidemia    Hypothyroidism    Stroke [facility]) 2013   right side weakness   Tobacco abuse     Surgical History: Past Surgical History:  Procedure Laterality Date   CARDIAC CATHETERIZATION N/A 12/07/2015   Procedure: Left Heart Cath and Coronary Angiography;  Surgeon: Corey Skains, MD;  Location: [place] [facility];  Service: Cardiovascular;  Laterality: N/A;   CATARACT EXTRACTION W/PHACO Left 03/24/2021   Procedure: CATARACT EXTRACTION PHACO AND INTRAOCULAR LENS PLACEMENT (IOC) LEFT DIABETIC 6.67 00:42.5;  Surgeon: Birder Robson, MD;  Location: [facility] ORS;  Service: Ophthalmology;  Laterality: Left;   COLONOSCOPY WITH PROPOFOL N/A 02/18/2019   Procedure: COLONOSCOPY WITH PROPOFOL;   Surgeon: Lucilla Lame, MD;  Location: [facility] ENDOSCOPY;  Service: Endoscopy;  Laterality: N/A;   CORONARY ANGIOPLASTY WITH STENT PLACEMENT     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   CORONARY ARTERY BYPASS GRAFT     CYSTOSCOPY WITH INSERTION OF UROLIFT N/A 04/29/2019   Procedure: CYSTOSCOPY WITH INSERTION OF UROLIFT;  Surgeon: Abbie Sons, MD;  Location: [facility] ORS;  Service: Urology;  Laterality: N/A;   ESOPHAGOGASTRODUODENOSCOPY (EGD) WITH PROPOFOL N/A 09/11/2016   Procedure: ESOPHAGOGASTRODUODENOSCOPY (EGD) WITH PROPOFOL;  Surgeon: Lucilla Lame, MD;  Location: [facility] ENDOSCOPY;  Service: Endoscopy;  Laterality: N/A;   ESOPHAGOGASTRODUODENOSCOPY (EGD) WITH PROPOFOL N/A 10/17/2016   Procedure: ESOPHAGOGASTRODUODENOSCOPY (EGD) WITH PROPOFOL;  Surgeon: Lucilla Lame, MD;  Location: [facility] ENDOSCOPY;  Service: Endoscopy;  Laterality: N/A;   ESOPHAGOGASTRODUODENOSCOPY (EGD) WITH PROPOFOL N/A 11/13/2017   Procedure: ESOPHAGOGASTRODUODENOSCOPY (EGD) WITH PROPOFOL;  Surgeon: Lucilla Lame, MD;  Location: [facility] ENDOSCOPY;  Service: Endoscopy;  Laterality: N/A;   pins R lower leg Left    rotator cuff replaced     right   SAVORY DILATION N/A 10/17/2016   Procedure: SAVORY DILATION;  Surgeon: Lucilla Lame, MD;  Location: [facility] ENDOSCOPY;  Service: Endoscopy;  Laterality: N/A;    Home Medications:  Allergies as of 09/05/2021   No Known Allergies      Medication List        Accurate as of September 05, 2021  9:07 AM. If you have any questions, ask your nurse or doctor.          aspirin EC 81 MG tablet Take 81 mg by mouth daily.  atorvastatin 80 MG tablet Commonly known as: LIPITOR Take 80 mg by mouth daily.   buPROPion 150 MG 24 hr tablet Commonly known as: WELLBUTRIN XL Take 150 mg by mouth at bedtime.   cholecalciferol 25 MCG (1000 UNIT) tablet Commonly known as: VITAMIN D3 Take 1,000 Units by mouth every evening.   clonazePAM 0.5 MG tablet Commonly known as: KLONOPIN Take 0.5 mg by mouth at  bedtime as needed (restless leg syndrome).   clopidogrel 75 MG tablet Commonly known as: PLAVIX Take 75 mg by mouth daily.   CoQ10 100 MG Caps Take 100 mg by mouth at bedtime.   empagliflozin 10 MG Tabs tablet Commonly known as: JARDIANCE Take 10 mg by mouth daily.   ezetimibe 10 MG tablet Commonly known as: ZETIA Take 10 mg by mouth daily.   glucose blood test strip   Iron 325 (65 Fe) MG Tabs Take 1 tablet by mouth daily.   levothyroxine 100 MCG tablet Commonly known as: SYNTHROID Take 100 mcg by mouth daily before breakfast.   metFORMIN 500 MG tablet Commonly known as: GLUCOPHAGE Take 500 mg by mouth 2 (two) times daily.   metoprolol tartrate 25 MG tablet Commonly known as: LOPRESSOR Take 25 mg by mouth 2 (two) times daily.   multivitamin with minerals Tabs tablet Take 1 tablet by mouth daily.   naproxen sodium 220 MG tablet Commonly known as: ALEVE Take 220-440 mg by mouth 2 (two) times daily as needed (pain.).   pantoprazole 40 MG tablet Commonly known as: PROTONIX Take 1 tablet (40 mg total) by mouth at bedtime.   psyllium 28 % packet Commonly known as: METAMUCIL SMOOTH TEXTURE Take 1 packet by mouth daily.   RA Krill Oil 500 MG Caps Take 500 mg by mouth at bedtime.   spironolactone 25 MG tablet Commonly known as: ALDACTONE 25 mg daily.   tamsulosin 0.4 MG Caps capsule Commonly known as: FLOMAX Take 1 capsule (0.4 mg total) by mouth daily.   venlafaxine XR 150 MG 24 hr capsule Commonly known as: EFFEXOR-XR Take 150 mg by mouth daily.        Allergies: No Known Allergies  Family History: Family History  Problem Relation Age of Onset   Heart failure Father    Throat cancer Mother    Diabetes Brother     Social History:  reports that he quit smoking about 9 years ago. His smoking use included cigarettes. He smoked an average of 2 packs per day. He has never used smokeless tobacco. He reports that he does not currently use alcohol. He  reports that he does not use drugs.   Physical Exam: BP 135/74    Pulse 86    Ht 5\' 11"  (1.803 m)    Wt 210 lb (95.3 kg)    BMI 29.29 kg/m   Constitutional:  Alert and oriented, No acute distress. HEENT: [place] AT, moist mucus membranes.  Trachea midline, no masses. Cardiovascular: No clubbing, cyanosis, or edema. Respiratory: Normal respiratory effort, no increased work of breathing. Psychiatric: Normal mood and affect.  Assessment & Plan:    1. Benign prostatic hyperplasia with LUTS Bladder scan PVR 167 mL Repeat UA today Complete Cipro course Schedule cystoscopy  2.  Recurrent UTI As above   Abbie Sons, MD  [facility] [address], [place], [place] [address] [phone]

## 2021-09-05 NOTE — Addendum Note (Signed)
Addended by: Kyra Manges on: 09/05/2021 12:37 PM   Modules accepted: Orders

## 2021-09-08 ENCOUNTER — Telehealth: Payer: Self-pay

## 2021-09-08 ENCOUNTER — Encounter: Payer: Self-pay | Admitting: Urology

## 2021-09-08 NOTE — Telephone Encounter (Signed)
Pt's wife LM on triage line stating that she reserved results via mychart but she cannot access the patient's mychart, unsure why. Informed pt's wife that the mychart message was simply the patients UA results being released. Urine cx is still pending, she will be called when those results come in.

## 2021-09-08 NOTE — Telephone Encounter (Signed)
Pt wife called back stating she was unable to get on mychart. I was able to activate mychart and reset temporary password. Wife was able to create her own private password and login to [place] successfully.

## 2021-09-11 LAB — CULTURE, URINE COMPREHENSIVE

## 2021-09-13 ENCOUNTER — Other Ambulatory Visit: Payer: Self-pay | Admitting: *Deleted

## 2021-09-13 ENCOUNTER — Encounter: Payer: Self-pay | Admitting: *Deleted

## 2021-09-13 ENCOUNTER — Telehealth: Payer: Self-pay | Admitting: *Deleted

## 2021-09-13 DIAGNOSIS — N200 Calculus of kidney: Secondary | ICD-10-CM

## 2021-09-13 NOTE — Telephone Encounter (Signed)
-----   Message from Abbie Sons, MD sent at 09/13/2021  3:18 PM EST ----- Please have patient do a KUB prior to cystoscopy on 2/17.  Urine culture grew a low level of bacteria.  We will need to give a gentamicin injection prior to cystoscopy based on antibiotic sensitivities

## 2021-09-13 NOTE — Telephone Encounter (Signed)
Left message on voice mail  . Also sent a my chart message .

## 2021-09-14 ENCOUNTER — Ambulatory Visit
Admission: RE | Admit: 2021-09-14 | Discharge: 2021-09-14 | Disposition: A | Discharge status: Home / Self Care | Payer: Medicare HMO | Source: Ambulatory Visit | Attending: Urology | Admitting: Urology

## 2021-09-14 DIAGNOSIS — N200 Calculus of kidney: Secondary | ICD-10-CM | POA: Diagnosis present

## 2021-09-16 ENCOUNTER — Ambulatory Visit: Payer: Medicare HMO | Admitting: Urology

## 2021-09-16 ENCOUNTER — Encounter: Payer: Self-pay | Admitting: Urology

## 2021-09-16 ENCOUNTER — Other Ambulatory Visit: Payer: Self-pay

## 2021-09-16 VITALS — BP 120/74 | HR 70 | Ht 71.0 in | Wt 220.0 lb

## 2021-09-16 DIAGNOSIS — N200 Calculus of kidney: Secondary | ICD-10-CM | POA: Diagnosis not present

## 2021-09-16 LAB — MICROSCOPIC EXAMINATION
RBC, Urine: NONE SEEN /hpf (ref 0–2)
WBC, UA: >30 /hpf — ABNORMAL HIGH (ref 0–5)

## 2021-09-16 LAB — URINALYSIS, COMPLETE
Bilirubin, UA: NEGATIVE
Glucose, UA: NEGATIVE
Ketones, UA: NEGATIVE
Nitrite, UA: NEGATIVE
Protein,UA: NEGATIVE
Specific Gravity, UA: 1.01 (ref 1.005–1.030)
Urobilinogen, Ur: 0.2 mg/dL (ref 0.2–1.0)
pH, UA: 6 (ref 5.0–7.5)

## 2021-09-16 MED ORDER — GENTAMICIN SULFATE 40 MG/ML IJ SOLN
80.0000 mg | Freq: Once | INTRAMUSCULAR | Status: AC
  Administered 2021-09-16: 80 mg via INTRAMUSCULAR

## 2021-09-16 NOTE — H&P (View-Only) (Signed)
° °  09/16/21  CC:  Chief Complaint  Patient presents with   Cysto    HPI: See my prior note 09/05/2021.  Urine culture grew low level of MDR Pseudomonas.  Received IM gentamicin preprocedure  Blood pressure 120/74, pulse 70, height 5\' 11"  (1.803 m), weight 220 lb (99.8 kg). NED. A&Ox3.   No respiratory distress   Abd soft, NT, ND Normal phallus with bilateral descended testicles  Cystoscopy Procedure Note  Patient identification was confirmed, informed consent was obtained, and patient was prepped using Betadine solution.  Lidocaine jelly was administered per urethral meatus.     Pre-Procedure: - Inspection reveals a normal caliber urethral meatus.  Procedure: The flexible cystoscope was introduced without difficulty - No urethral strictures/lesions are present. -  Mild lateral lobe enlargement  prostate  -  Moderate  bladder neck elevation.  Exposed UroLift implant left bladder neck with calcification - Bilateral ureteral orifices identified - Bladder mucosa  reveals no ulcers, tumors, or lesions - No bladder stones - Mild trabeculation  Retroflexion shows calcification left bladder neck as described above   Post-Procedure: - Patient tolerated the procedure well  Assessment/ Plan: Exposed bladder neck implant with encrustation which is the most likely source of his recurrent UTI Findings were discussed in detail. Schedule cystoscopy under anesthesia with implant removal.  Possible transurethral incision bladder neck The procedure was discussed in detail including potential risks of bleeding, infection/sepsis    Abbie Sons, MD

## 2021-09-16 NOTE — Addendum Note (Signed)
Addended by: Chrystie Nose on: 09/16/2021 08:35 AM   Modules accepted: Orders

## 2021-09-16 NOTE — Progress Notes (Signed)
° °  09/16/21  CC:  Chief Complaint  Patient presents with   Cysto    HPI: See my prior note 09/05/2021.  Urine culture grew low level of MDR Pseudomonas.  Received IM gentamicin preprocedure  Blood pressure 120/74, pulse 70, height 5\' 11"  (1.803 m), weight 220 lb (99.8 kg). NED. A&Ox3.   No respiratory distress   Abd soft, NT, ND Normal phallus with bilateral descended testicles  Cystoscopy Procedure Note  Patient identification was confirmed, informed consent was obtained, and patient was prepped using Betadine solution.  Lidocaine jelly was administered per urethral meatus.     Pre-Procedure: - Inspection reveals a normal caliber urethral meatus.  Procedure: The flexible cystoscope was introduced without difficulty - No urethral strictures/lesions are present. -  Mild lateral lobe enlargement  prostate  -  Moderate  bladder neck elevation.  Exposed UroLift implant left bladder neck with calcification - Bilateral ureteral orifices identified - Bladder mucosa  reveals no ulcers, tumors, or lesions - No bladder stones - Mild trabeculation  Retroflexion shows calcification left bladder neck as described above   Post-Procedure: - Patient tolerated the procedure well  Assessment/ Plan: Exposed bladder neck implant with encrustation which is the most likely source of his recurrent UTI Findings were discussed in detail. Schedule cystoscopy under anesthesia with implant removal.  Possible transurethral incision bladder neck The procedure was discussed in detail including potential risks of bleeding, infection/sepsis    Abbie Sons, MD

## 2021-09-22 ENCOUNTER — Encounter: Payer: Self-pay | Admitting: Urology

## 2021-09-22 ENCOUNTER — Other Ambulatory Visit: Payer: Self-pay | Admitting: Urology

## 2021-09-22 DIAGNOSIS — N401 Enlarged prostate with lower urinary tract symptoms: Secondary | ICD-10-CM

## 2021-09-22 DIAGNOSIS — N390 Urinary tract infection, site not specified: Secondary | ICD-10-CM

## 2021-09-22 DIAGNOSIS — N210 Calculus in bladder: Secondary | ICD-10-CM

## 2021-09-22 NOTE — Progress Notes (Signed)
Surgical Physician Order Form [facility] [place]  * Scheduling expectation : Next Available  *Length of Case: 60 minutes  *Clearance needed: yes  *Anticoagulation Instructions: Hold all anticoagulants  *Aspirin Instructions: Hold Plavix ok to continue ASA  *Post-op visit Date/Instructions:  1-3 day cath removal  *Diagnosis: Bladder calculus, recurrent UTI, outlet obstruction   *Procedure:   Cystolitholapaxy/foreign body removal; possible TURP/TUIP   Additional orders: N/A  -Admit type: OUTpatient  -Anesthesia: Choice  -VTE Prophylaxis Standing Order SCDs       Other:   -Standing Lab Orders Per Anesthesia    Lab other: UA&Urine Culture-ordered and pending  -Standing Test orders EKG/Chest x-ray per Anesthesia       Test other:   - Medications: Will await urine culture for IV antibiotic order  -Other orders:  N/A

## 2021-09-23 ENCOUNTER — Telehealth: Payer: Self-pay | Admitting: Urgent Care

## 2021-09-23 NOTE — Progress Notes (Signed)
°  Perioperative Services Pre-Admission/Anesthesia Testing     Date: 09/23/21  Name: Paul Bass MRN:   [id number]  Re: Request from surgery for clearance prior to scheduled procedure  Patient is scheduled to undergo a CYSTOLITHOLAPAXY/FOREIGN BODY REMOVAL; POSSIBLE TURP/TUIP on 10/04/2021 with Dr. John Giovanni, MD. Patient has not been scheduled for his PAT appointment at this point, thus has not undergone review by PAT RN and/or APP. Received communication from primary attending surgeon's office requesting that patient be submitted for clearance from cardiology.   PROVIDER SPECIALTY FAXED TO  Serafina Royals, MD  Cardiology  [phone]   Plan:  Clearance documents generated and faxed to appropriate provider(s) as noted above. Note will be updated to reflect communication with provider's office as it relates to clearance being provided and/or the need for office visit prior to clearance for surgery being issued.   Honor Loh, MSN, APRN, FNP-C, CEN [facility]  Peri-operative Services Nurse Practitioner Phone: [phone] 09/23/21 4:14 PM  NOTE: This note has been prepared using Dragon dictation software. Despite my best ability to proofread, there is always the potential that unintentional transcriptional errors may still occur from this process.

## 2021-09-24 LAB — CULTURE, URINE COMPREHENSIVE

## 2021-09-26 ENCOUNTER — Telehealth: Payer: Self-pay

## 2021-09-26 NOTE — Telephone Encounter (Signed)
I spoke with [name] and his wife. We have discussed possible surgery dates and Tuesday March 7th, 2023 was agreed upon by all parties. Patient given information about surgery date, what to expect pre-operatively and post operatively.   We discussed that a Pre-Admission Testing office will be calling to set up the pre-op visit that will take place prior to surgery, and that these appointments are typically done over the phone with a Pre-Admissions RN.   Informed patient that our office will communicate any additional care to be provided after surgery. Patients questions or concerns were discussed during our call. Advised to call our office should there be any additional information, questions or concerns that arise. Patient verbalized understanding.

## 2021-09-26 NOTE — Progress Notes (Signed)
[facility] Posting Form   Surgery Date/Time: Date: 10/04/2021  Surgeon: Dr. 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, N13.8  -CPT: 45409,81191, possible 52450, 52601  Surgery: Cystolitholapaxy with foreign body removal; Possible Transurethral Resection of the Prostate or possible Transurethral incision of the prostate  Stop Anticoagulations: Yes, hold plavix but may continue ASA  Cardiac/Medical/Pulmonary Clearance needed: yes   Clearance needed from Dr: Nehemiah Massed, MD  Clearance request sent on: Date: 09/26/21   *Orders entered into EPIC  Date: 09/26/21   *Case booked in EPIC  Date: 09/23/2021  *Notified pt of Surgery: Date: 09/23/2021  PRE-OP UA & CX: yes, obtained on 09/16/2021  *Placed into Prior Authorization Work Fabio Bering Date: 09/26/21   Assistant/laser/rep:No

## 2021-09-27 ENCOUNTER — Telehealth: Payer: Self-pay | Admitting: Urology

## 2021-09-27 NOTE — Telephone Encounter (Signed)
Preop urine culture is growing Pseudomonas.  Start Cipro 500 mg twice daily x7 days.  Preop IV antibiotic cefepime 1 g.

## 2021-09-28 ENCOUNTER — Telehealth: Payer: Self-pay | Admitting: Urgent Care

## 2021-09-28 MED ORDER — CIPROFLOXACIN HCL 500 MG PO TABS: 500.0000 mg | ORAL_TABLET | Freq: Two times a day (BID) | ORAL | 0 refills | Status: DC

## 2021-09-28 NOTE — Addendum Note (Signed)
Addended by: Gerald Leitz A on: 09/28/2021 02:54 PM   Modules accepted: Orders

## 2021-09-28 NOTE — Progress Notes (Signed)
?  Perioperative Services ?Pre-Admission/Anesthesia Testing ? ?  ? ?Date: 09/28/21 ? ?Name: Paul Bass ?MRN:   [id number] ? ?Re: Surgical clearance ? ?Surgical clearance received from Dr. Derrick Ravel office (cardiology). Patient has been cleared for the planned CYSTOLITHOLAPAXY/FOREIGN BODY REMOVAL; POSSIBLE TURP/TUIP procedure scheduled for 10/04/2021 with Dr. John Giovanni, MD.  Cardiology notes that patient may proceed with an overall LOW risk stratification.  Of note, patient currently on daily DAPT therapy (ASA + clopidogrel).  Cardiology has cleared patient to hold both medications for 3 days prior to his procedure with plans to restart as soon as postoperatively respectively minimized by primary attending surgeon. Copy of signed clearance form placed on patient's OR chart for review by the surgical/anesthetic team on the day of his procedure.  ? ?Honor Loh, MSN, APRN, FNP-C, CEN ?[facility]  ?Peri-operative Services Nurse Practitioner ?Phone: [phone] ?09/28/21 9:51 AM ? ?

## 2021-09-28 NOTE — Telephone Encounter (Signed)
Spoke with pt. Advised of results and verbalized needing to start medication. Also informed patient about receiving cardiac clearance and the need to stop plavix and asa 3 days prior to surgery.  ?

## 2021-09-29 ENCOUNTER — Other Ambulatory Visit: Payer: Self-pay

## 2021-09-29 ENCOUNTER — Encounter: Payer: Self-pay | Admitting: Urology

## 2021-09-29 ENCOUNTER — Other Ambulatory Visit
Admission: RE | Admit: 2021-09-29 | Discharge: 2021-09-29 | Disposition: A | Discharge status: Home / Self Care | Payer: Medicare HMO | Source: Ambulatory Visit | Attending: Urology | Admitting: Urology

## 2021-09-29 HISTORY — DX: Personal history of urinary calculi: Z87.442

## 2021-09-29 HISTORY — DX: Anemia, unspecified: D64.9

## 2021-09-29 HISTORY — DX: Sleep apnea, unspecified: G47.30

## 2021-09-29 HISTORY — DX: Unspecified osteoarthritis, unspecified site: M19.90

## 2021-09-29 HISTORY — DX: Cardiac arrhythmia, unspecified: I49.9

## 2021-09-29 NOTE — Patient Instructions (Addendum)
Your procedure is scheduled on: 10/04/21 - Tuesday ?Report to the Registration Desk on the 1st floor of the [facility]. ?To find out your arrival time, please call [phone] between 1PM - 3PM on: 10/03/21 - Monday ? ?REMEMBER: ?Instructions that are not followed completely may result in serious medical risk, up to and including death; or upon the discretion of your surgeon and anesthesiologist your surgery may need to be rescheduled. ? ?Do not eat food or drink any fluids after midnight the night before surgery.  ?No gum chewing, lozengers or hard candies. ? ? ?TAKE THESE MEDICATIONS THE MORNING OF SURGERY WITH A SIP OF WATER: ? ? -atorvastatin (LIPITOR) 80 MG tablet ?- clonazePAM (KLONOPIN) 0.5 MG tablet ?- ezetimibe (ZETIA) 10 MG tablet ?- levothyroxine (SYNTHROID, LEVOTHROID) 100 MCG tablet ?- metoprolol tartrate (LOPRESSOR) 25 MG tablet ?- pantoprazole (PROTONIX) 40 MG tablet ?- tamsulosin (FLOMAX) 0.4 MG CAPS capsule ?- venlafaxine XR (EFFEXOR-XR) 150 MG 24 hr capsule ? ? ? ? Do Not take metFORMIN (GLUCOPHAGE) 500 MG tablet on 03/05, 03/06 and do not take the day of surgery. ? ?Follow recommendations from Cardiologist, Pulmonologist or PCP regarding stopping Aspirin, Coumadin, Plavix, Eliquis, Pradaxa, or Pletal. Stop Aspirin and Plavix beginning 09/30/21. ? ?One week prior to surgery: ?Stop Anti-inflammatories (NSAIDS) such as Advil, Aleve, Ibuprofen, Motrin, Naproxen, Naprosyn and Aspirin based products such as Excedrin, Goodys Powder, BC Powder. ? ?Stop ANY OVER THE COUNTER supplements until after surgery.RA KRILL OIL 500 MG CAPS,Multiple Vitamin (MULTIVITAMIN WITH MINERALS) TABS, Coenzyme Q10 (COQ10) 100 MG CAPS, cholecalciferol (VITAMIN D). ? ?You may however, continue to take Tylenol if needed for pain up until the day of surgery. ? ?No Alcohol for 24 hours before or after surgery. ? ?No Smoking including e-cigarettes for 24 hours prior to surgery.  ?No chewable tobacco products for at least 6  hours prior to surgery.  ?No nicotine patches on the day of surgery. ? ?Do not use any "recreational" drugs for at least a week prior to your surgery.  ?Please be advised that the combination of cocaine and anesthesia may have negative outcomes, up to and including death. ?If you test positive for cocaine, your surgery will be cancelled. ? ?On the morning of surgery brush your teeth with toothpaste and water, you may rinse your mouth with mouthwash if you wish. ?Do not swallow any toothpaste or mouthwash. ? ?Do not wear jewelry, make-up, hairpins, clips or nail polish. ? ?Do not wear lotions, powders, or perfumes.  ? ?Do not shave body from the neck down 48 hours prior to surgery just in case you cut yourself which could leave a site for infection.  ?Also, freshly shaved skin may become irritated if using the CHG soap. ? ?Contact lenses, hearing aids and dentures may not be worn into surgery. ? ?Do not bring valuables to the hospital. [facility] is not responsible for any missing/lost belongings or valuables.  ? ?Notify your doctor if there is any change in your medical condition (cold, fever, infection). ? ?Wear comfortable clothing (specific to your surgery type) to the hospital. ? ?After surgery, you can help prevent lung complications by doing breathing exercises.  ?Take deep breaths and cough every 1-2 hours. Your doctor may order a device called an Incentive Spirometer to help you take deep breaths. ?When coughing or sneezing, hold a pillow firmly against your incision with both hands. This is called ?splinting.? Doing this helps protect your incision. It also decreases belly discomfort. ? ?If you are  being admitted to the hospital overnight, leave your suitcase in the car. ?After surgery it may be brought to your room. ? ?If you are being discharged the day of surgery, you will not be allowed to drive home. ?You will need a responsible adult (18 years or older) to drive you home and stay with you that night.   ? ?If you are taking public transportation, you will need to have a responsible adult (18 years or older) with you. ?Please confirm with your physician that it is acceptable to use public transportation.  ? ?Please call the [facility] at [phone] if you have any questions about these instructions. ? ?Surgery Visitation Policy: ? ?Patients undergoing a surgery or procedure may have one family member or support person with them as long as that person is not COVID-19 positive or experiencing its symptoms.  ?That person may remain in the waiting area during the procedure and may rotate out with other people. ? ?Inpatient Visitation:   ? ?Visiting hours are 7 a.m. to 8 p.m. ?Up to two visitors ages 16+ are allowed at one time in a patient room. The visitors may rotate out with other people during the day. Visitors must check out when they leave, or other visitors will not be allowed. One designated support person may remain overnight. ?The visitor must pass COVID-19 screenings, use hand sanitizer when entering and exiting the patient?s room and wear a mask at all times, including in the patient?s room. ?Patients must also wear a mask when staff or their visitor are in the room. ?Masking is required regardless of vaccination status.  ?

## 2021-09-30 ENCOUNTER — Encounter: Payer: Self-pay | Admitting: Urology

## 2021-09-30 NOTE — Progress Notes (Signed)
Perioperative Services  Pre-Admission/Anesthesia Testing Clinical Review  Date: 09/30/21  Patient Demographics:  Name: Paul Bass DOB:   1945/08/19 MRN:   [id number]  Planned Surgical Procedure(s):    Case: [id number] Date/Time: 10/04/21 1142   Procedures:      CYSTOSCOPY WITH LITHOLAPAXY     TRANSURETHRAL RESECTION OF THE PROSTATE (TURP)     CYSTOSCOPY WITH FOREIGN BODY REMOVAL     TRANSURETHRAL INCISION OF THE PROSTATE (TUIP)   Anesthesia type: Choice   Pre-op diagnosis: Bladder Calculus, Recurrent Urinary Tract Infection, Outlet Obstruction   Location: [facility] OR ROOM 10 / [facility]   Surgeons: Abbie Sons, MD   NOTE: Available PAT nursing documentation and vital signs have been reviewed. Clinical nursing staff has updated patient's PMH/PSHx, current medication list, and drug allergies/intolerances to ensure comprehensive history available to assist in medical decision making as it pertains to the aforementioned surgical procedure and anticipated anesthetic course. Extensive review of available clinical information performed. Paul Bass PMH and PSHx updated with any diagnoses/procedures that  may have been inadvertently omitted during his intake with the pre-admission testing department's nursing staff.  Clinical Discussion:  Paul Bass is a 76 y.o. male who is submitted for pre-surgical anesthesia review and clearance prior to him undergoing the above procedure. Patient is a Former Smoker (quit 04/2012). Pertinent PMH includes: CAD (s/p CABG), inferior wall MI, AJR, CHF, BILATERAL carotid artery disease, PVD, CVA, postoperative atrial fibrillation, HTN, HLD, T2DM, hypothyroidism, COPD, OSAH (does not require nocturnal PAP therapy), anemia, nephrolithiasis, OA, periodic limb movement disorder, depression, anxiety (on BZO).  Patient is followed by cardiology Nehemiah Massed, MD). He was last seen in the cardiology clinic on 08/17/2021; notes reviewed. At the time  of his clinic visit, the patient denied any chest pain, shortness of breath, PND, orthopnea, palpitations, significant peripheral edema, vertiginous symptoms, or presyncope/syncope.  Patient with a PMH significant for cardiovascular diagnoses.  Patient reportedly suffered an inferior wall MI back in 2002.  Records regarding this cardiovascular event not available for review at time of consult.  It is noted that patient did undergo diagnostic left heart catheterization and subsequent PCI placing a single stent (unknown type) to the RCA.  Diagnostic left heart catheterization performed on 12/07/2015 revealing multivessel CAD; 5% ISR of the previously placed mid to distal RCA stent, 90% distal RCA, 25% proximal RCA, 25% proximal to mid LCx, 90% OM 3, 95% ostial OM2 to OM2, and 75% mid to distal LAD.  Patient was referred to CVTS for consultation and consideration of CABG procedure.  Patient underwent three-vessel CABG procedure on 01/03/2016.  LIMA-LAD, SVG-PDA, and SVG-OM2 bypass grafts were placed.  Procedure complicated by episodes of postoperative atrial fibrillation.  Most recent TTE was performed on 08/11/2021 revealing a low normal left ventricular systolic function with an EF of 50%.  Diastolic Doppler parameters consistent with impaired relaxation (G1DD).  There was trivial mitral and tricuspid valve regurgitation.  There was no evidence of a significant transvalvular gradient to suggest aortic or mitral valve stenosis.  Myocardial perfusion imaging study performed on 08/11/2021 revealed a significantly reduced LVEF of 33%.  There was inferior hypokinesis noted.  SPECT images revealed a moderate fixed perfusion defect present in the inferior myocardium consistent with previous scar without evidence of myocardial ischemia.  Given patient's history of CAD, CABG, MI, and PVD, he remains on daily DAPT therapy (ASA + clopidogrel).  Patient reported to be compliant with therapy with no evidence of GI  bleeding.  Blood pressure well controlled at 108/68 on currently prescribed beta-blocker and diuretic therapies.  He is on a statin + ezetimibe for his HLD.  T2DM well-controlled on currently prescribed regimen; last HgbA1c was 7.0% when checked on 08/10/2021.  Patient has an OSAH diagnosis, however he does not use nocturnal PAP therapy.  Functional capacity somewhat limited by age and multiple medical comorbidities, however patient still felt to be able to achieve at least 4 METS of activity without angina/anginal: Symptoms.  No changes were made to his medication regimen.  Patient follow-up with outpatient cardiology in 6 months or sooner if needed.  Paul Bass is scheduled for an CYSTOSCOPY WITH LITHOLAPAXY; TRANSURETHRAL RESECTION OF THE PROSTATE (TURP); CYSTOSCOPY WITH FOREIGN BODY REMOVAL; TRANSURETHRAL INCISION OF THE PROSTATE (TUIP) on 10/04/2021 with Dr. John Giovanni, MD.  Given patient's past medical history significant for cardiovascular diagnoses, presurgical cardiac clearance was sought by the PAT team. Per cardiology, "this patient is optimized for surgery and may proceed with the planned procedural course with a LOW Risk of significant perioperative cardiovascular complications".  This patient is on daily DAPT therapy. He has been instructed on recommendations for holding these medications for 3 days prior to his procedure with plans to restart since postoperatively respectively minimized by primary attending surgeon.  Patient is aware that he will take his last doses of both ASA and clopidogrel on 09/30/2021.  Patient denies previous perioperative complications with anesthesia in the past. In review of the available records, it is noted that patient underwent a MAC anesthetic course here (ASA III) in 02/2021 without documented complications.   Vitals with BMI 09/16/2021 09/05/2021 08/04/2021  Height _0  _1  -  Weight 220 lbs 210 lbs -  BMI 31.5 17.6 -  Systolic 160 737 106  Diastolic  74 74 81  Pulse 70 86 77    Providers/Specialists:   NOTE: Primary physician provider listed below. Patient may have been seen by APP or partner within same practice.   PROVIDER ROLE / SPECIALTY LAST Claud Kelp, MD Urology (Surgeon) 09/22/2021  Gauger, Victoriano Lain, NP Primary Care Provider 08/24/2021  Serafina Royals, MD Cardiology 08/17/2021   Allergies:  Patient has no known allergies.  Current Home Medications:   No current facility-administered medications for this encounter.    aspirin EC 81 MG tablet   atorvastatin (LIPITOR) 80 MG tablet   buPROPion (WELLBUTRIN XL) 150 MG 24 hr tablet   cholecalciferol (VITAMIN D3) 25 MCG (1000 UT) tablet   clonazePAM (KLONOPIN) 0.5 MG tablet   clopidogrel (PLAVIX) 75 MG tablet   Coenzyme Q10 (COQ10) 100 MG CAPS   ezetimibe (ZETIA) 10 MG tablet   Ferrous Sulfate (IRON) 325 (65 Fe) MG TABS   levothyroxine (SYNTHROID, LEVOTHROID) 100 MCG tablet   metFORMIN (GLUCOPHAGE) 500 MG tablet   metoprolol tartrate (LOPRESSOR) 25 MG tablet   Multiple Vitamin (MULTIVITAMIN WITH MINERALS) TABS   naproxen sodium (ALEVE) 220 MG tablet   pantoprazole (PROTONIX) 40 MG tablet   psyllium (METAMUCIL SMOOTH TEXTURE) 28 % packet   RA KRILL OIL 500 MG CAPS   spironolactone (ALDACTONE) 25 MG tablet   tamsulosin (FLOMAX) 0.4 MG CAPS capsule   telmisartan (MICARDIS) 40 MG tablet   venlafaxine XR (EFFEXOR-XR) 150 MG 24 hr capsule   ciprofloxacin (CIPRO) 500 MG tablet   glucose blood test strip   History:   Past Medical History:  Diagnosis Date   Accelerated junctional rhythm    Anemia    Anxiety  Arthritis    Bilateral carotid artery disease ([name])    BPH (benign prostatic hypertrophy)    CAD S/P percutaneous coronary angioplasty    a.) PCI in 2002 placing a RCA stent (unknown type). b.) LHC 12/07/2015: 5% ISR m-dRCA, 90% dRCA, 25% pRCA, 25% p-mLCx, 100% OM3, 95% oOM2-OM2, 75% m-dLAD; refer to CVTS. c.) 3v CABG 01/03/2016   CHF  (congestive heart failure) ([name])    Cognitive deficit following cerebrovascular accident (CVA)    COPD (chronic obstructive pulmonary disease) ([place])    Cortical cataract    Depression    Dyspnea    GERD (gastroesophageal reflux disease)    History of 2019 novel coronavirus disease (COVID-19) 01/19/2021   History of acute inferior wall MI 2002   a.) PCI was performed placing a RCA stent (unknown type)   History of kidney stones    HTN (hypertension)    Hyperlipidemia    Hypothyroidism    OSA (obstructive sleep apnea)    a.) does not require nocturnal PAP therapy   PLMD (periodic limb movement disorder)    Prostatitis    PVD (peripheral vascular disease) ([name])    S/P CABG x 3 01/03/2016   a.) LIMA-LAD, SVG-PDA, SVG-OM2   Stroke ([place]) 04/2012   a.) LEFT M1 occlusion from possible moderate LEFT ICA stenosis; Tx with TPA + mechanical embolectomy with Trevo Provue retrieval device with full recanalization and proximal LEFT ICA rescue stent. b/) residual RIGHT sided weakness   T2DM (type 2 diabetes mellitus) ([place])    Tobacco abuse    Past Surgical History:  Procedure Laterality Date   CARDIAC CATHETERIZATION N/A 12/07/2015   Procedure: Left Heart Cath and Coronary Angiography;  Surgeon: Corey Skains, MD;  Location: [facility];  Service: Cardiovascular;  Laterality: N/A;   CATARACT EXTRACTION W/PHACO Left 03/24/2021   Procedure: CATARACT EXTRACTION PHACO AND INTRAOCULAR LENS PLACEMENT (IOC) LEFT DIABETIC 6.67 00:42.5;  Surgeon: Birder Robson, MD;  Location: [facility]S;  Service: Ophthalmology;  Laterality: Left;   COLONOSCOPY WITH PROPOFOL N/A 02/18/2019   Procedure: COLONOSCOPY WITH PROPOFOL;  Surgeon: Lucilla Lame, MD;  Location: [facility] ENDOSCOPY;  Service: Endoscopy;  Laterality: N/A;   CORONARY ANGIOPLASTY WITH STENT PLACEMENT Left 2002   CORONARY ARTERY BYPASS GRAFT N/A 01/03/2016   Procedure: 3v CORONARY ARTERY BYPASS GRAFT (LIMA-LAD, SVG-PDA, SVG-OM2); Location: [facility];  Surgeon: Dwaine Deter, MD   CYSTOSCOPY WITH INSERTION OF UROLIFT N/A 04/29/2019   Procedure: CYSTOSCOPY WITH INSERTION OF UROLIFT;  Surgeon: Abbie Sons, MD;  Location: [facility]S;  Service: Urology;  Laterality: N/A;   ESOPHAGOGASTRODUODENOSCOPY (EGD) WITH PROPOFOL N/A 09/11/2016   Procedure: ESOPHAGOGASTRODUODENOSCOPY (EGD) WITH PROPOFOL;  Surgeon: Lucilla Lame, MD;  Location: [facility] ENDOSCOPY;  Service: Endoscopy;  Laterality: N/A;   ESOPHAGOGASTRODUODENOSCOPY (EGD) WITH PROPOFOL N/A 10/17/2016   Procedure: ESOPHAGOGASTRODUODENOSCOPY (EGD) WITH PROPOFOL;  Surgeon: Lucilla Lame, MD;  Location: [facility] ENDOSCOPY;  Service: Endoscopy;  Laterality: N/A;   ESOPHAGOGASTRODUODENOSCOPY (EGD) WITH PROPOFOL N/A 11/13/2017   Procedure: ESOPHAGOGASTRODUODENOSCOPY (EGD) WITH PROPOFOL;  Surgeon: Lucilla Lame, MD;  Location: [facility] ENDOSCOPY;  Service: Endoscopy;  Laterality: N/A;   pins R lower leg Left    rotator cuff replaced Right    SAVORY DILATION N/A 10/17/2016   Procedure: SAVORY DILATION;  Surgeon: Lucilla Lame, MD;  Location: [facility] ENDOSCOPY;  Service: Endoscopy;  Laterality: N/A;   Family History  Problem Relation Age of Onset   Heart failure Father    Throat cancer Mother    Diabetes Brother  Social History   Tobacco Use   Smoking status: Former    Packs/day: 2.00    Types: Cigarettes    Quit date: 05/20/2012    Years since quitting: 9.3   Smokeless tobacco: Never  Vaping Use   Vaping Use: Never used  Substance Use Topics   Alcohol use: Not Currently    Comment: weekend drinker, had 5 beers on 05/11/12, prior to stroke, No alcohol since stroke   Drug use: Never    Pertinent Clinical Results:  LABS: Labs reviewed: Acceptable for surgery.   Ref Range & Units 08/10/2021  WBC (White Blood Cell Count) 4.1 - 10.2 103/uL 8.7   RBC (Red Blood Cell Count) 4.69 - 6.13 106/uL 5.06   Hemoglobin 14.1 - 18.1 gm/dL 16.2   Hematocrit 40.0 - 52.0 % 46.7   MCV (Mean Corpuscular Volume)  80.0 - 100.0 fl 92.3   MCH (Mean Corpuscular Hemoglobin) 27.0 - 31.2 pg 32.0 High    MCHC (Mean Corpuscular Hemoglobin Concentration) 32.0 - 36.0 gm/dL 34.7   Platelet Count 150 - 450 103/uL 260   RDW-CV (Red Cell Distribution Width) 11.6 - 14.8 % 13.6   MPV (Mean Platelet Volume) 9.4 - 12.4 fl 8.4 Low    Neutrophils 1.50 - 7.80 103/uL 5.90   Lymphocytes 1.00 - 3.60 103/uL 1.90   Mixed Count 0.10 - 0.90 103/uL 0.90   Neutrophil % 32.0 - 70.0 % 67.4   Lymphocyte % 10.0 - 50.0 % 21.9   Mixed % 3.0 - 14.4 % 10.7   Resulting Agency  [facility]  Specimen Collected: 08/10/21 09:01 Last Resulted: 08/10/21 09:34  Received From: [facility]  Result Received: 08/22/21 15:24    08/10/2021   Glucose 70 - 110 mg/dL 143 High    Sodium 136 - 145 mmol/L 135 Low    Potassium 3.6 - 5.1 mmol/L 5.1   Chloride 97 - 109 mmol/L 100   Carbon Dioxide (CO2) 22.0 - 32.0 mmol/L 29.1   Urea Nitrogen (BUN) 7 - 25 mg/dL 22   Creatinine 0.7 - 1.3 mg/dL 1.4 High    Glomerular Filtration Rate (eGFR), MDRD Estimate >60 mL/min/1.73sq m 49 Low    Calcium 8.7 - 10.3 mg/dL 10.0   AST  8 - 39 U/L 17   ALT  6 - 57 U/L 19   Alk Phos (alkaline Phosphatase) 34 - 104 U/L 82   Albumin 3.5 - 4.8 g/dL 4.5   Bilirubin, Total 0.3 - 1.2 mg/dL 0.8   Protein, Total 6.1 - 7.9 g/dL 7.1   A/G Ratio 1.0 - 5.0 gm/dL 1.7   Resulting Agency  [facility]  Specimen Collected: 08/10/21 09:01 Last Resulted: 08/10/21 13:45  Received From: [facility]  Result Received: 08/22/21 15:24   Procedure visit on 09/16/2021  Component Date Value Ref Range Status   Specific Gravity, UA 09/16/2021 1.010  1.005 - 1.030 Final   pH, UA 09/16/2021 6.0  5.0 - 7.5 Final   Color, UA 09/16/2021 Yellow  Yellow Final   Appearance Ur 09/16/2021 Clear  Clear Final   Leukocytes,UA 09/16/2021 2+ (A)  Negative Final   Protein,UA 09/16/2021 Negative  Negative/Trace Final   Glucose, UA  09/16/2021 Negative  Negative Final   Ketones, UA 09/16/2021 Negative  Negative Final   RBC, UA 09/16/2021 Trace (A)  Negative Final   Bilirubin, UA 09/16/2021 Negative  Negative Final   Urobilinogen, Ur 09/16/2021 0.2  0.2 - 1.0  mg/dL Final   Nitrite, UA 09/16/2021 Negative  Negative Final   Microscopic Examination 09/16/2021 See below:   Final   Urine Culture, Comprehensive 09/16/2021 Final report (A)   Final   Organism ID, Bacteria 09/16/2021 Comment (A)   Final   Comment: Pseudomonas aeruginosa 50,000-100,000 colony forming units per mL    ANTIMICROBIAL SUSCEPTIBILITY 09/16/2021 Comment   Final   Comment:       ** S = Susceptible; I = Intermediate; R = Resistant **                    P = Positive; N = Negative             MICS are expressed in micrograms per mL    Antibiotic                 RSLT#1    RSLT#2    RSLT#3    RSLT#4 Amikacin                       S Cefepime                       S Ceftazidime                    S Ciprofloxacin                  S Gentamicin                     S Imipenem                       S Levofloxacin                   S Meropenem                    S Piperacillin                      S Ticarcillin                        S Tobramycin                    S    WBC, UA 09/16/2021 >30 (H)  0 - 5 /hpf Final   RBC 09/16/2021 None seen  0 - 2 /hpf Final   Epithelial Cells (non renal) 09/16/2021 0-10  0 - 10 /hpf Final   Bacteria, UA 09/16/2021 Few (A)  None seen/Few Final    ECG: Date: 07/15/2021 Rate: 65 bpm Rhythm:  Sinus rhythm with marked sinus arrhythmia and first-degree AV block Axis (leads I and aVF): Normal Intervals: PR 232 ms. QRS 82 ms. QTc 372 ms. ST segment and T wave changes: No evidence of acute ST segment elevation or depression Comparison: Similar to previous tracing obtained on 07/13/2021   IMAGING / PROCEDURES: DIAGNOSTIC RADIOGRAPHS OF ABDOMEN 1 VIEW performed on 09/14/2021 No appreciable renal calculi, evaluation is  however limited due to overlying bowel contents. Calculi in the RIGHT upper quadrant, likely to represent gallstones. Ovoid calculus in the pelvic cavity, which may represent a urinary bladder calculus.  MYOCARDIAL PERFUSION IMAGING STUDY (LEXISCAN) performed on 08/11/2021 LVEF 33% Inferior hypokinesis No artifact Left ventricular cavity size normal SPECT images demonstrate a moderate fixed perfusion abnormality of moderate intensity present  in the inferior myocardial region on both the stress and rest images consistent with previous infarct with no evidence of ischemia.  TRANSTHORACIC ECHOCARDIOGRAM performed on 35/00/9381 LVEF 82% Diastolic Doppler parameters consistent with abnormal relaxation (G1DD) Normal right ventricular systolic function Trivial MR and TR No AR or PR No valvular stenosis No pericardial effusion  CORONARY ARTERY BYPASS GRAFTING performed on 01/03/2016 Three-vessel CABG procedure LIMA-LAD SVG-PDA SVG-OM 2  LEFT HEART CATHETERIZATION AND CORONARY ANGIOGRAPHY performed on 12/07/2015 Abnormal left ventricular function with ejection fraction of 45% Inferior hypokinesis Multivessel CAD 5% ISR of a previously placed mid to distal RCA stent 90% stenosis of the distal RCA 25% stenosis of the proximal RCA  25% stenosis of the proximal to mid LCx  100% stenosis of OM3 95% stenosis of the ostial OM2-OM2 75% stenosis of the mid to distal LAD Recommendations Continue medical management of CAD risk factors Refer to CVTS for consultation regarding CABG procedure    Impression and Plan:  Paul Bass has been referred for pre-anesthesia review and clearance prior to him undergoing the planned anesthetic and procedural courses. Available labs, pertinent testing, and imaging results were personally reviewed by me. This patient has been appropriately cleared by cardiology with an overall LOW risk of significant perioperative cardiovascular complications.  Based on  clinical review performed today (09/30/21), barring any significant acute changes in the patient's overall condition, it is anticipated that he will be able to proceed with the planned surgical intervention. Any acute changes in clinical condition may necessitate his procedure being postponed and/or cancelled. Patient will meet with anesthesia team (MD and/or CRNA) on the day of his procedure for preoperative evaluation/assessment. Questions regarding anesthetic course will be fielded at that time.   Pre-surgical instructions were reviewed with the patient during his PAT appointment and questions were fielded by PAT clinical staff. Patient was advised that if any questions or concerns arise prior to his procedure then he should return a call to PAT and/or his surgeon's office to discuss.  Honor Loh, MSN, APRN, FNP-C, CEN [facility]  Peri-operative Services Nurse Practitioner Phone: [phone] Fax: [phone] 09/30/21 9:27 AM  NOTE: This note has been prepared using Dragon dictation software. Despite my best ability to proofread, there is always the potential that unintentional transcriptional errors may still occur from this process.

## 2021-10-04 ENCOUNTER — Ambulatory Visit
Admission: RE | Admit: 2021-10-04 | Discharge: 2021-10-04 | Disposition: A | Discharge status: Home / Self Care | Payer: Medicare HMO | Source: Ambulatory Visit | Attending: Urology | Admitting: Urology

## 2021-10-04 ENCOUNTER — Encounter
Admission: RE | Disposition: A | Discharge status: Home / Self Care | Payer: Self-pay | Source: Ambulatory Visit | Attending: Urology

## 2021-10-04 ENCOUNTER — Encounter: Payer: Self-pay | Admitting: Urology

## 2021-10-04 ENCOUNTER — Ambulatory Visit: Payer: Medicare HMO | Admitting: Urgent Care

## 2021-10-04 DIAGNOSIS — E039 Hypothyroidism, unspecified: Secondary | ICD-10-CM | POA: Insufficient documentation

## 2021-10-04 DIAGNOSIS — T83128A Displacement of other urinary devices and implants, initial encounter: Secondary | ICD-10-CM | POA: Diagnosis not present

## 2021-10-04 DIAGNOSIS — N320 Bladder-neck obstruction: Secondary | ICD-10-CM | POA: Diagnosis not present

## 2021-10-04 DIAGNOSIS — J449 Chronic obstructive pulmonary disease, unspecified: Secondary | ICD-10-CM | POA: Diagnosis not present

## 2021-10-04 DIAGNOSIS — Z87440 Personal history of urinary (tract) infections: Secondary | ICD-10-CM | POA: Diagnosis not present

## 2021-10-04 DIAGNOSIS — Z87891 Personal history of nicotine dependence: Secondary | ICD-10-CM | POA: Diagnosis not present

## 2021-10-04 DIAGNOSIS — I509 Heart failure, unspecified: Secondary | ICD-10-CM | POA: Diagnosis not present

## 2021-10-04 DIAGNOSIS — X58XXXA Exposure to other specified factors, initial encounter: Secondary | ICD-10-CM | POA: Insufficient documentation

## 2021-10-04 DIAGNOSIS — T190XXA Foreign body in urethra, initial encounter: Secondary | ICD-10-CM | POA: Insufficient documentation

## 2021-10-04 DIAGNOSIS — N390 Urinary tract infection, site not specified: Secondary | ICD-10-CM | POA: Insufficient documentation

## 2021-10-04 DIAGNOSIS — I2510 Atherosclerotic heart disease of native coronary artery without angina pectoris: Secondary | ICD-10-CM | POA: Diagnosis not present

## 2021-10-04 DIAGNOSIS — E119 Type 2 diabetes mellitus without complications: Secondary | ICD-10-CM | POA: Insufficient documentation

## 2021-10-04 DIAGNOSIS — Z951 Presence of aortocoronary bypass graft: Secondary | ICD-10-CM | POA: Diagnosis not present

## 2021-10-04 DIAGNOSIS — N138 Other obstructive and reflux uropathy: Secondary | ICD-10-CM | POA: Diagnosis not present

## 2021-10-04 DIAGNOSIS — N210 Calculus in bladder: Secondary | ICD-10-CM

## 2021-10-04 DIAGNOSIS — I110 Hypertensive heart disease with heart failure: Secondary | ICD-10-CM | POA: Insufficient documentation

## 2021-10-04 DIAGNOSIS — N401 Enlarged prostate with lower urinary tract symptoms: Secondary | ICD-10-CM

## 2021-10-04 HISTORY — DX: Inflammatory disease of prostate, unspecified: N41.9

## 2021-10-04 HISTORY — DX: Disorder of arteries and arterioles, unspecified: I77.9

## 2021-10-04 HISTORY — DX: Long term (current) use of antithrombotics/antiplatelets: Z79.02

## 2021-10-04 HISTORY — DX: Type 2 diabetes mellitus without complications: E11.9

## 2021-10-04 HISTORY — DX: Obstructive sleep apnea (adult) (pediatric): G47.33

## 2021-10-04 HISTORY — DX: Heart failure, unspecified: I50.9

## 2021-10-04 HISTORY — DX: Unspecified cataract: H26.9

## 2021-10-04 HISTORY — DX: Unspecified symptoms and signs involving cognitive functions following cerebral infarction: I69.319

## 2021-10-04 HISTORY — DX: Peripheral vascular disease, unspecified: I73.9

## 2021-10-04 HISTORY — PX: CYSTOSCOPY W/ URETERAL STENT REMOVAL: SHX1430

## 2021-10-04 HISTORY — DX: Periodic limb movement disorder: G47.61

## 2021-10-04 HISTORY — DX: Other specified cardiac arrhythmias: I49.8

## 2021-10-04 HISTORY — PX: TRANSURETHRAL INCISION OF PROSTATE: SHX2573

## 2021-10-04 LAB — GLUCOSE, CAPILLARY
Glucose-Capillary: 231 mg/dL — ABNORMAL HIGH (ref 70–99)
Glucose-Capillary: 175 mg/dL — ABNORMAL HIGH (ref 70–99)

## 2021-10-04 SURGERY — REMOVAL, STENT, URETER, CYSTOSCOPIC
Anesthesia: General

## 2021-10-04 MED ORDER — LIDOCAINE HCL (PF) 2 % IJ SOLN
INTRAMUSCULAR | Status: AC
  Filled 2021-10-04: qty 5

## 2021-10-04 MED ORDER — EPHEDRINE SULFATE (PRESSORS) 50 MG/ML IJ SOLN
INTRAMUSCULAR | Status: DC | PRN
  Administered 2021-10-04 (×2): 10 mg via INTRAVENOUS

## 2021-10-04 MED ORDER — FENTANYL CITRATE (PF) 100 MCG/2ML IJ SOLN
25.0000 ug | INTRAMUSCULAR | Status: DC | PRN
  Administered 2021-10-04 (×3): 25 ug via INTRAVENOUS

## 2021-10-04 MED ORDER — FENTANYL CITRATE (PF) 100 MCG/2ML IJ SOLN
INTRAMUSCULAR | Status: AC
  Administered 2021-10-04: 25 ug via INTRAVENOUS
  Filled 2021-10-04: qty 2

## 2021-10-04 MED ORDER — PHENYLEPHRINE 40 MCG/ML (10ML) SYRINGE FOR IV PUSH (FOR BLOOD PRESSURE SUPPORT)
PREFILLED_SYRINGE | INTRAVENOUS | Status: DC | PRN
  Administered 2021-10-04 (×2): 80 ug via INTRAVENOUS
  Administered 2021-10-04: 160 ug via INTRAVENOUS

## 2021-10-04 MED ORDER — DEXAMETHASONE SODIUM PHOSPHATE 10 MG/ML IJ SOLN
INTRAMUSCULAR | Status: DC | PRN
  Administered 2021-10-04: 5 mg via INTRAVENOUS

## 2021-10-04 MED ORDER — SODIUM CHLORIDE 0.9 % IV SOLN: INTRAVENOUS | Status: DC

## 2021-10-04 MED ORDER — LIDOCAINE HCL (CARDIAC) PF 100 MG/5ML IV SOSY
PREFILLED_SYRINGE | INTRAVENOUS | Status: DC | PRN
Start: 2021-10-04 — End: 2021-10-04
  Administered 2021-10-04: 80 mg via INTRAVENOUS

## 2021-10-04 MED ORDER — FENTANYL CITRATE (PF) 100 MCG/2ML IJ SOLN
INTRAMUSCULAR | Status: DC | PRN
Start: 2021-10-04 — End: 2021-10-04
  Administered 2021-10-04: 50 ug via INTRAVENOUS
  Administered 2021-10-04 (×2): 25 ug via INTRAVENOUS

## 2021-10-04 MED ORDER — ONDANSETRON HCL 4 MG/2ML IJ SOLN: 4.0000 mg | Freq: Once | INTRAMUSCULAR | Status: DC | PRN

## 2021-10-04 MED ORDER — FENTANYL CITRATE (PF) 100 MCG/2ML IJ SOLN
INTRAMUSCULAR | Status: AC
  Filled 2021-10-04: qty 2

## 2021-10-04 MED ORDER — DEXAMETHASONE SODIUM PHOSPHATE 10 MG/ML IJ SOLN
INTRAMUSCULAR | Status: AC
  Filled 2021-10-04: qty 1

## 2021-10-04 MED ORDER — PROPOFOL 10 MG/ML IV BOLUS
INTRAVENOUS | Status: DC | PRN
  Administered 2021-10-04: 150 mg via INTRAVENOUS

## 2021-10-04 MED ORDER — ACETAMINOPHEN 10 MG/ML IV SOLN
INTRAVENOUS | Status: AC
  Filled 2021-10-04: qty 100

## 2021-10-04 MED ORDER — SODIUM CHLORIDE 0.9 % IV SOLN
1.0000 g | Freq: Once | INTRAVENOUS | Status: AC
  Administered 2021-10-04: 1 g via INTRAVENOUS
  Filled 2021-10-04: qty 1

## 2021-10-04 MED ORDER — CHLORHEXIDINE GLUCONATE 0.12 % MT SOLN
15.0000 mL | Freq: Once | OROMUCOSAL | Status: AC
  Administered 2021-10-04: 15 mL via OROMUCOSAL

## 2021-10-04 MED ORDER — CHLORHEXIDINE GLUCONATE 0.12 % MT SOLN
OROMUCOSAL | Status: AC
  Filled 2021-10-04: qty 15

## 2021-10-04 MED ORDER — ONDANSETRON HCL 4 MG/2ML IJ SOLN
INTRAMUSCULAR | Status: AC
  Filled 2021-10-04: qty 2

## 2021-10-04 MED ORDER — ACETAMINOPHEN 10 MG/ML IV SOLN
INTRAVENOUS | Status: DC | PRN
  Administered 2021-10-04: 1000 mg via INTRAVENOUS

## 2021-10-04 MED ORDER — PROPOFOL 10 MG/ML IV BOLUS
INTRAVENOUS | Status: AC
  Filled 2021-10-04: qty 20

## 2021-10-04 MED ORDER — ORAL CARE MOUTH RINSE: 15.0000 mL | Freq: Once | OROMUCOSAL | Status: AC

## 2021-10-04 MED ORDER — ONDANSETRON HCL 4 MG/2ML IJ SOLN
INTRAMUSCULAR | Status: DC | PRN
  Administered 2021-10-04: 4 mg via INTRAVENOUS

## 2021-10-04 SURGICAL SUPPLY — 41 items
ADAPTER IRRIG TUBE 2 SPIKE SOL (ADAPTER) ×4 IMPLANT
ADPR TBG 2 SPK PMP STRL ASCP (ADAPTER) ×2
BAG DRAIN CYSTO-URO LG1000N (MISCELLANEOUS) ×2 IMPLANT
BAG DRN LRG CPC RND TRDRP CNTR (MISCELLANEOUS) ×1
BAG DRN RND TRDRP ANRFLXCHMBR (UROLOGICAL SUPPLIES) ×1
BAG URINE DRAIN 2000ML AR STRL (UROLOGICAL SUPPLIES) ×1 IMPLANT
BAG URO DRAIN 4000ML (MISCELLANEOUS) ×2 IMPLANT
BASKET ZERO TIP 1.9FR (BASKET) IMPLANT
BSKT STON RTRVL ZERO TP 1.9FR (BASKET)
CATH FOL 2WAY LX 18X30 (CATHETERS) ×1 IMPLANT
CATH FOL 2WAY LX 24X30 (CATHETERS) IMPLANT
DRAPE UTILITY 15X26 TOWEL STRL (DRAPES) ×1 IMPLANT
ELECT BIVAP BIPO 22/24 DONUT (ELECTROSURGICAL) ×2
ELECT LOOP 22F BIPOLAR SML (ELECTROSURGICAL)
ELECTRD BIVAP BIPO 22/24 DONUT (ELECTROSURGICAL) IMPLANT
ELECTRODE LOOP 22F BIPOLAR SML (ELECTROSURGICAL) IMPLANT
FIBER LASER FLEXIVA PULSE 910 (Laser) IMPLANT
GAUZE 4X4 16PLY ~~LOC~~+RFID DBL (SPONGE) ×3 IMPLANT
GLOVE SURG UNDER POLY LF SZ7.5 (GLOVE) ×2 IMPLANT
GOWN STRL REUS W/ TWL LRG LVL3 (GOWN DISPOSABLE) ×2 IMPLANT
GOWN STRL REUS W/ TWL XL LVL3 (GOWN DISPOSABLE) ×1 IMPLANT
GOWN STRL REUS W/TWL LRG LVL3 (GOWN DISPOSABLE) ×4
GOWN STRL REUS W/TWL XL LVL3 (GOWN DISPOSABLE) ×2
GUIDEWIRE STR DUAL SENSOR (WIRE) ×2 IMPLANT
HOLDER FOLEY CATH W/STRAP (MISCELLANEOUS) ×2 IMPLANT
IV NS IRRIG 3000ML ARTHROMATIC (IV SOLUTION) ×8 IMPLANT
KIT PROBE TRILOGY 3.9X350 (MISCELLANEOUS) IMPLANT
KIT TURNOVER CYSTO (KITS) ×2 IMPLANT
LOOP CUT BIPOLAR 24F LRG (ELECTROSURGICAL) IMPLANT
MANIFOLD NEPTUNE II (INSTRUMENTS) ×1 IMPLANT
PACK CYSTO AR (MISCELLANEOUS) ×2 IMPLANT
SET CYSTO W/LG BORE CLAMP LF (SET/KITS/TRAYS/PACK) ×1 IMPLANT
SET IRRIG Y TYPE TUR BLADDER L (SET/KITS/TRAYS/PACK) ×2 IMPLANT
SET IRRIGATING DISP (SET/KITS/TRAYS/PACK) ×2 IMPLANT
SURGILUBE 2OZ TUBE FLIPTOP (MISCELLANEOUS) ×2 IMPLANT
SYR TOOMEY 50ML (SYRINGE) ×2 IMPLANT
SYR TOOMEY IRRIG 70ML (MISCELLANEOUS) ×2
SYRINGE TOOMEY IRRIG 70ML (MISCELLANEOUS) ×1 IMPLANT
WATER STERILE IRR 1000ML POUR (IV SOLUTION) ×2 IMPLANT
WATER STERILE IRR 3000ML UROMA (IV SOLUTION) IMPLANT
WATER STERILE IRR 500ML POUR (IV SOLUTION) ×2 IMPLANT

## 2021-10-04 NOTE — Op Note (Signed)
Preoperative diagnosis:  ?Prostatic urethral foreign body with encrustation ?Recurrent UTI ?Bladder outlet obstruction ? ?Postoperative diagnosis:  ?Same ? ?Procedure: ?Cystoscopy with removal prostatic urethral foreign body ?Transurethral incision prostate ? ?Surgeon: Abbie Sons, MD ? ?Anesthesia: General ? ?Complications: None ? ?Intraoperative findings:  ?Cystoscopy-urethra normal in caliber without stricture.  No lateral lobe enlargement though moderate bladder neck elevation.  Exposed UroLift implant left bladder neck with mild encrustation.  Bladder mucosa without erythema, solid or papillary lesions.  Mild-moderate trabeculation.  UOs normal-appearing bilaterally and well away from the bladder neck. ? ?EBL: Minimal ? ?Specimens: Prostate chips ? ?Indication: Paul Bass is a 76 y.o. male status post UroLift September 2020.  In early January 2023 onset storage elated voiding symptoms, weak urinary stream and recurrent UTI.  Cystoscopy showed an exposed implant at the left bladder neck with encrustation.  After reviewing the management options for treatment, he elected to proceed with the above surgical procedure(s). We have discussed the potential benefits and risks of the procedure, side effects of the proposed treatment, the likelihood of the patient achieving the goals of the procedure, and any potential problems that might occur during the procedure or recuperation. Informed consent has been obtained. ? ?Description of procedure: ? ?The patient was taken to the operating room and general anesthesia was induced.  The patient was placed in the dorsal lithotomy position, prepped and draped in the usual sterile fashion, and preoperative antibiotics were administered. A preoperative time-out was performed.  ? ?A 21 French cystoscope was lubricated, passed per urethra and advanced proximally with findings as described above.  ?The exposed UroLift implant at the left bladder neck was grasped with  endoscopic forceps and able to be partially removed.   ? ?A 24 French continuous-flow resectoscope sheath with obturator was lubricated and passed per urethra and advanced into the bladder.  The obturator was replaced with an [place] with button electrode.  On bipolar cutting current the left bladder neck was incised and carried to the verumontanum.  The proximal implant was able to be freed and was flushed into the bladder.  The left mid implant was also exposed and able to be freed.  An identical incision was performed on the contralateral side using cutting current.  The proximal right implant was exposed with this incision and flushed into the bladder.  The intervening prostatic tissue between the 2 incisions was then vaporized inferiorly and flushed into the bladder.  The implants were removed from the bladder with endoscopic forceps as well as the prostate chips.  Hemostasis was obtained with cautery.  With the resectoscope position at the verumontanum the channel was wide open and hemostasis was noted to be adequate with the resectoscope on minimal flow. ? ?The resectoscope was removed and a 20 French Foley catheter was placed without difficulty.  The catheter was irrigated with return of blood-tinged effluent. ? ?After anesthetic reversal he was transported to the PACU in stable condition. ? ? ?Plan: ?Follow-up appointment 10/06/2021 for Foley catheter removal ? ? ?Abbie Sons, M.D. ? ?

## 2021-10-04 NOTE — Transfer of Care (Signed)
Immediate Anesthesia Transfer of Care Note ? ?Patient: Paul Bass ? ?Procedure(s) Performed: CYSTOSCOPY WITH PROSTATE FOREIGN BODY REMOVAL ?TRANSURETHRAL INCISION OF THE PROSTATE (TUIP) ? ?Patient Location: PACU ? ?Anesthesia Type:General ? ?Level of Consciousness: awake ? ?Airway & Oxygen Therapy: Patient Spontanous Breathing and Patient connected to nasal cannula oxygen ? ?Post-op Assessment: Report given to RN and Post -op Vital signs reviewed and stable ? ?Post vital signs: Reviewed and stable ? ?Last Vitals:  ?Vitals Value Taken Time  ?BP 112/56 10/04/21 1336  ?Temp    ?Pulse 58 10/04/21 1341  ?Resp 14 10/04/21 1341  ?SpO2 99 % 10/04/21 1341  ?Vitals shown include unvalidated device data. ? ?Last Pain:  ?Vitals:  ? 10/04/21 1027  ?TempSrc: Oral  ?PainSc: 0-No pain  ?   ? ?  ? ?Complications: No notable events documented. ?

## 2021-10-04 NOTE — Anesthesia Procedure Notes (Signed)
Procedure Name: LMA Insertion ?Date/Time: 10/04/2021 12:34 PM ?Performed by: Loletha Grayer, CRNA ?Pre-anesthesia Checklist: Patient identified, Patient being monitored, Timeout performed, Emergency Drugs available and Suction available ?Patient Re-evaluated:Patient Re-evaluated prior to induction ?Oxygen Delivery Method: Circle system utilized ?Preoxygenation: Pre-oxygenation with 100% oxygen ?Induction Type: IV induction ?Ventilation: Mask ventilation without difficulty ?LMA: LMA inserted ?LMA Size: 4.0 ?Number of attempts: 1 ?Placement Confirmation: positive ETCO2 and breath sounds checked- equal and bilateral ?Tube secured with: Tape ?Dental Injury: Teeth and Oropharynx as per pre-operative assessment  ? ? ? ? ?

## 2021-10-04 NOTE — Anesthesia Preprocedure Evaluation (Signed)
Anesthesia Evaluation  ?Patient identified by MRN, date of birth, ID band ?Patient awake ? ? ? ?Reviewed: ?Allergy & Precautions, NPO status , Patient's Chart, lab work & pertinent test results ? ?Airway ?Mallampati: II ? ?TM Distance: >3 FB ?Neck ROM: Full ? ? ? Dental ? ?(+) Edentulous Upper, Upper Dentures ?  ?Pulmonary ?neg pulmonary ROS, shortness of breath and with exertion, sleep apnea , COPD,  COPD inhaler, former smoker,  ?  ?Pulmonary exam normal ? ?+ decreased breath sounds ? ? ? ? ? Cardiovascular ?Exercise Tolerance: Poor ?hypertension, Pt. on medications ?+ CAD and + CABG  ?negative cardio ROS ?Normal cardiovascular exam ?Rhythm:Regular  ? ?  ?Neuro/Psych ?Anxiety Depression CVA, Residual Symptoms negative neurological ROS ? negative psych ROS  ? GI/Hepatic ?negative GI ROS, Neg liver ROS, GERD  ,  ?Endo/Other  ?negative endocrine ROSdiabetes, Type 2Hypothyroidism  ? Renal/GU ?negative Renal ROS  ? ?  ?Musculoskeletal ? ? Abdominal ?Normal abdominal exam  (+)   ?Peds ?negative pediatric ROS ?(+)  Hematology ?negative hematology ROS ?(+) Blood dyscrasia, anemia ,   ?Anesthesia Other Findings ?Past Medical History: ?No date: Accelerated junctional rhythm ?No date: Anemia ?No date: Anxiety ?    Comment:  a.) Tx'd with BZO PRN ?No date: Arthritis ?No date: Bilateral carotid artery disease ([place]) ?No date: BPH (benign prostatic hypertrophy) ?No date: CAD S/P percutaneous coronary angioplasty ?    Comment:  a.) PCI in 2002 placing a RCA stent (unknown type). b.)  ?             LHC 12/07/2015: 5% ISR m-dRCA, 90% dRCA, 25% pRCA, 25%  ?             p-mLCx, 100% OM3, 95% oOM2-OM2, 75% m-dLAD; refer to  ?             CVTS. c.) 3v CABG 01/03/2016 ?No date: CHF (congestive heart failure) ([place]) ?    Comment:  a.) TTE 05/12/2012: EF 50-55%; mild LA enlargement;  ?             G1DD. b.)  TTE 11/26/2015: EF 45%; mild BAE; triv PR,  ?             mild MR/TR; inferolateral HK; G1DD. c.)   TTE 11/07/2017:  ?             EF 35%; RV enlargement; BAE; inferior and inferolateral  ?             HK; triv PR; mild MR/TR. d.)  TTE 09/17/2019: EF 35%;  ?             mild LVH; triv MR/TR. e.)  TTE 08/11/2021: EF 50%; triv  ?             MR/TR; G1DD. ?No date: Cognitive deficit following cerebrovascular accident (CVA) ?No date: COPD (chronic obstructive pulmonary disease) ([place]) ?No date: Cortical cataract ?No date: Depression ?No date: Dyspnea ?No date: GERD (gastroesophageal reflux disease) ?01/19/2021: History of 2019 novel coronavirus disease (COVID-19) ?2002: History of acute inferior wall MI ?    Comment:  a.) PCI was performed placing a RCA stent (unknown type) ?No date: History of kidney stones ?No date: HTN (hypertension) ?No date: Hyperlipidemia ?No date: Hypothyroidism ?No date: Long term current use of antithrombotics/antiplatelets ?    Comment:  a.) DAPT therapy (ASA + clopidogrel) ?No date: OSA (obstructive sleep apnea) ?    Comment:  a.) does not require nocturnal PAP therapy ?No date: PLMD (  periodic limb movement disorder) ?01/03/2016: Postoperative atrial fibrillation ([place]) ?    Comment:  a.) following CABG procedure ?No date: Prostatitis ?No date: PVD (peripheral vascular disease) ([place]) ?01/03/2016: S/P CABG x 3 ?    Comment:  a.) LIMA-LAD, SVG-PDA, SVG-OM2 ?04/2012: Stroke ([place]) ?    Comment:  a.) LEFT M1 occlusion from possible moderate LEFT ICA  ?             stenosis; Tx with TPA + mechanical embolectomy with Trevo ?             Provue retrieval device with full recanalization and  ?             proximal LEFT ICA rescue stent. b/) residual RIGHT sided  ?             weakness ?No date: T2DM (type 2 diabetes mellitus) ([place]) ?No date: Tobacco abuse ? ?Past Surgical History: ?12/07/2015: CARDIAC CATHETERIZATION; N/A ?    Comment:  Procedure: Left Heart Cath and Coronary Angiography;   ?             Surgeon: Corey Skains, MD;  Location: [facility]  ?             CV LAB;  Service:  Cardiovascular;  Laterality: N/A; ?03/24/2021: CATARACT EXTRACTION W/PHACO; Left ?    Comment:  Procedure: CATARACT EXTRACTION PHACO AND INTRAOCULAR  ?             LENS PLACEMENT (IOC) LEFT DIABETIC 6.67 00:42.5;   ?             Surgeon: Birder Robson, MD;  Location: [facility];   ?             Service: Ophthalmology;  Laterality: Left; ?02/18/2019: COLONOSCOPY WITH PROPOFOL; N/A ?    Comment:  Procedure: COLONOSCOPY WITH PROPOFOL;  Surgeon: Allen Norris,  ?             Darren, MD;  Location: [facility];  Service:  ?             Endoscopy;  Laterality: N/A; ?2002: CORONARY ANGIOPLASTY WITH STENT PLACEMENT; Left ?01/03/2016: CORONARY ARTERY BYPASS GRAFT; N/A ?    Comment:  Procedure: 3v CORONARY ARTERY BYPASS GRAFT (LIMA-LAD,  ?             SVG-PDA, SVG-OM2); Location: [facility]; Surgeon: Marcello Moores  ?             Danise Mina, MD ?04/29/2019: CYSTOSCOPY WITH INSERTION OF UROLIFT; N/A ?    Comment:  Procedure: CYSTOSCOPY WITH INSERTION OF UROLIFT;   ?             Surgeon: Abbie Sons, MD;  Location: [facility];   ?             Service: Urology;  Laterality: N/A; ?09/11/2016: ESOPHAGOGASTRODUODENOSCOPY (EGD) WITH PROPOFOL; N/A ?    Comment:  Procedure: ESOPHAGOGASTRODUODENOSCOPY (EGD) WITH  ?             PROPOFOL;  Surgeon: Lucilla Lame, MD;  Location: [facility]  ?             ENDOSCOPY;  Service: Endoscopy;  Laterality: N/A; ?10/17/2016: ESOPHAGOGASTRODUODENOSCOPY (EGD) WITH PROPOFOL; N/A ?    Comment:  Procedure: ESOPHAGOGASTRODUODENOSCOPY (EGD) WITH  ?             PROPOFOL;  Surgeon: Lucilla Lame, MD;  Location: [facility]  ?  ENDOSCOPY;  Service: Endoscopy;  Laterality: N/A; ?11/13/2017: ESOPHAGOGASTRODUODENOSCOPY (EGD) WITH PROPOFOL; N/A ?    Comment:  Procedure: ESOPHAGOGASTRODUODENOSCOPY (EGD) WITH  ?             PROPOFOL;  Surgeon: Lucilla Lame, MD;  Location: [facility]  ?             ENDOSCOPY;  Service: Endoscopy;  Laterality: N/A; ?No date: pins R lower leg; Left ?No date: rotator cuff replaced; Right ?10/17/2016: SAVORY  DILATION; N/A ?    Comment:  Procedure: SAVORY DILATION;  Surgeon: Lucilla Lame, MD;   ?             Location: [facility];  Service: Endoscopy;   ?             Laterality: N/A; ? ?BMI   ? Body Mass Index: 30.68 kg/m?  ?  ? ? Reproductive/Obstetrics ?negative OB ROS ? ?  ? ? ? ? ? ? ? ? ? ? ? ? ? ?  ?  ? ? ? ? ? ? ? ? ?Anesthesia Physical ?Anesthesia Plan ? ?ASA: 3 ? ?Anesthesia Plan: General  ? ?Post-op Pain Management:   ? ?Induction: Intravenous ? ?PONV Risk Score and Plan: Dexamethasone, Ondansetron, Midazolam and Treatment may vary due to age or medical condition ? ?Airway Management Planned: LMA ? ?Additional Equipment:  ? ?Intra-op Plan:  ? ?Post-operative Plan: Extubation in OR ? ?Informed Consent: I have reviewed the patients History and Physical, chart, labs and discussed the procedure including the risks, benefits and alternatives for the proposed anesthesia with the patient or authorized representative who has indicated his/her understanding and acceptance.  ? ? ? ?Dental Advisory Given ? ?Plan Discussed with: CRNA and Surgeon ? ?Anesthesia Plan Comments:   ? ? ? ? ? ? ?Anesthesia Quick Evaluation ? ?

## 2021-10-04 NOTE — Discharge Instructions (Addendum)
AMBULATORY SURGERY  DISCHARGE INSTRUCTIONS   The drugs that you were given will stay in your system until tomorrow so for the next 24 hours you should not:  Drive an automobile Make any legal decisions Drink any alcoholic beverage   You may resume regular meals tomorrow.  Today it is better to start with liquids and gradually work up to solid foods.  You may eat anything you prefer, but it is better to start with liquids, then soup and crackers, and gradually work up to solid foods.   Please notify your doctor immediately if you have any unusual bleeding, trouble breathing, redness and pain at the surgery site, drainage, fever, or pain not relieved by medication.    Additional Instructions:  Transurethral incision of the Prostate    Care After  Refer to this sheet in the next few weeks. These discharge instructions provide you with general information on caring for yourself after you leave the hospital. Your caregiver may also give you specific instructions. Your treatment has been planned according to the most current medical practices available, but unavoidable complications sometimes occur. If you have any problems or questions after discharge, please call your caregiver.  HOME CARE INSTRUCTIONS   Medications You may receive medicine for pain management. As your level of discomfort decreases, adjustments in your pain medicines may be made.  Take all medicines as directed.  You may be given a medicine (antibiotic) to kill germs following surgery. Finish all medicines. Let your caregiver know if you have any side effects or problems from the medicine.  If you are on aspirin, it would be best not to restart the aspirin until the blood in the urine clears Hygiene You can take a shower after surgery.  You should not take a bath while you still have the urethral catheter. Activity You will be encouraged to get out of bed as much as possible and increase your activity level as  tolerated.  Spend the first week in and around your home. For 3 weeks, avoid the following:  Straining.  Running.  Strenuous work.  Walks longer than a few blocks.  Riding for extended periods.  Sexual relations.  Do not lift heavy objects (more than 20 pounds) for at least 1 month. When lifting, use your arms instead of your abdominal muscles.  You will be encouraged to walk as tolerated. Do not exert yourself. Increase your activity level slowly. Remember that it is important to keep moving after an operation of any type. This cuts down on the possibility of developing blood clots.  Your caregiver will tell you when you can resume driving and light housework. Discuss this at your first office visit after discharge. Diet No special diet is ordered after a TURP. However, if you are on a special diet for another medical problem, it should be continued.  Normal fluid intake is usually recommended.  Avoid alcohol and caffeinated drinks for 2 weeks. They irritate the bladder. Decaffeinated drinks are okay.  Avoid spicy foods.  Bladder Function For the first 10 days, empty the bladder whenever you feel a definite desire. Do not try to hold the urine for long periods of time.  Urinating once or twice a night even after you are healed is not uncommon.  You may see some recurrence of blood in the urine after discharge from the hospital. This usually happens within 2 weeks after the procedure.If this occurs, force fluids again as you did in the hospital and reduce your activity.  Bowel Function You may experience some constipation after surgery. This can be minimized by increasing fluids and fiber in your diet. Drink enough water and fluids to keep your urine clear or pale yellow.  A stool softener may be prescribed for use at home. Do not strain to move your bowels.  If you are requiring increased pain medicine, it is important that you take stool softeners to prevent constipation. This will help to  promote proper healing by reducing the need to strain to move your bowels.  Sexual Activity Semen movement in the opposite direction and into the bladder (retrograde ejaculation) may occur. Since the semen passes into the bladder, cloudy urine can occur the first time you urinate after intercourse. Or, you may not have an ejaculation during erection. Ask your caregiver when you can resume sexual activity. Retrograde ejaculation and reduced semen discharge should not reduce one's pleasure of intercourse.  Postoperative Visit Arrange the date and time of your after surgery visit with your caregiver.  Return to Work After your recovery is complete, you will be able to return to work and resume all activities. Your caregiver will inform you when you can return to work.    Foley Catheter Care A soft, flexible tube (Foley catheter) may have been placed in your bladder to drain urine and fluid. Follow these instructions: Taking Care of the Catheter Keep the area where the catheter leaves your body clean.  Attach the catheter to the leg so there is no tension on the catheter.  Keep the drainage bag below the level of the bladder, but keep it OFF the floor.  Do not take long soaking baths. Your caregiver will give instructions about showering.  Wash your hands before touching ANYTHING related to the catheter or bag.  Using mild soap and warm water on a washcloth:  Clean the area closest to the catheter insertion site using a circular motion around the catheter.  Clean the catheter itself by wiping AWAY from the insertion site for several inches down the tube.  NEVER wipe upward as this could sweep bacteria up into the urethra (tube in your body that normally drains the bladder) and cause infection.  Place a small amount of sterile lubricant at the tip of the penis where the catheter is entering.  Taking Care of the Drainage Bags Two drainage bags may be taken home: a large overnight drainage bag, and a  smaller leg bag which fits underneath clothing.  It is okay to wear the overnight bag at any time, but NEVER wear the smaller leg bag at night.  Keep the drainage bag well below the level of your bladder. This prevents backflow of urine into the bladder and allows the urine to drain freely.  Anchor the tubing to your leg to prevent pulling or tension on the catheter. Use tape or a leg strap provided by the hospital.  Empty the drainage bag when it is 1/2 to 3/4 full. Wash your hands before and after touching the bag.  Periodically check the tubing for kinks to make sure there is no pressure on the tubing which could restrict the flow of urine.  Changing the Drainage Bags Cleanse both ends of the clean bag with alcohol before changing.  Pinch off the rubber catheter to avoid urine spillage during the disconnection.  Disconnect the dirty bag and connect the clean one.  Empty the dirty bag carefully to avoid a urine spill.  Attach the new bag to the leg with tape  or a leg strap.  Cleaning the Drainage Bags Whenever a drainage bag is disconnected, it must be cleaned quickly so it is ready for the next use.  Wash the bag in warm, soapy water.  Rinse the bag thoroughly with warm water.  Soak the bag for 30 minutes in a solution of white vinegar and water (1 cup vinegar to 1 quart warm water).  Rinse with warm water.  SEEK MEDICAL CARE IF:  You have chills or night sweats.  You are leaking around your catheter or have problems with your catheter. It is not uncommon to have sporadic leakage around your catheter as a result of bladder spasms. If the leakage stops, there is not much need for concern. If you are uncertain, call your caregiver.  You develop side effects that you think are coming from your medicines.  SEEK IMMEDIATE MEDICAL CARE IF:  You are suddenly unable to urinate. Check to see if there are any kinks in the drainage tubing that may cause this. If you cannot find any kinks, call your  caregiver immediately. This is an emergency.  You develop shortness of breath or chest pains.  Bleeding persists or clots develop in your urine.  You have a fever.  You develop pain in your back or over your lower belly (abdomen).  You develop pain or swelling in your legs.  Any problems you are having get worse rather than better.  MAKE SURE YOU:  Understand these instructions.  Will watch your condition.  Will get help right away if you are not doing well or get worse.     Please contact your physician with any problems or Same Day Surgery at [phone], Monday through Friday 6 am to 4 pm, or [place] at [facility] number at [phone].

## 2021-10-04 NOTE — Interval H&P Note (Signed)
History and Physical Interval Note: ? ?CV:RRR ?Lungs: clear ? ?10/04/2021 ?12:17 PM ? ?Paul Bass  has presented today for surgery, with the diagnosis of Bladder Calculus, Recurrent Urinary Tract Infection, Outlet Obstruction.  The various methods of treatment have been discussed with the patient and family. After consideration of risks, benefits and other options for treatment, the patient has consented to  Procedure(s): ?CYSTOSCOPY WITH LITHOLAPAXY (N/A) ?TRANSURETHRAL RESECTION OF THE PROSTATE (TURP) (N/A) ?CYSTOSCOPY WITH FOREIGN BODY REMOVAL (N/A) ?TRANSURETHRAL INCISION OF THE PROSTATE (TUIP) (N/A) as a surgical intervention.  The patient's history has been reviewed, patient examined, no change in status, stable for surgery.  I have reviewed the patient's chart and labs.  Questions were answered to the patient's satisfaction.   ? ? ?[name] ? ? ?

## 2021-10-05 ENCOUNTER — Encounter: Payer: Self-pay | Admitting: Urology

## 2021-10-05 LAB — SURGICAL PATHOLOGY

## 2021-10-06 ENCOUNTER — Other Ambulatory Visit: Payer: Self-pay

## 2021-10-06 ENCOUNTER — Ambulatory Visit (INDEPENDENT_AMBULATORY_CARE_PROVIDER_SITE_OTHER): Payer: Medicare HMO | Admitting: Urology

## 2021-10-06 DIAGNOSIS — R3914 Feeling of incomplete bladder emptying: Secondary | ICD-10-CM

## 2021-10-06 DIAGNOSIS — N401 Enlarged prostate with lower urinary tract symptoms: Secondary | ICD-10-CM

## 2021-10-06 NOTE — Anesthesia Postprocedure Evaluation (Signed)
Anesthesia Post Note ? ?Patient: Paul Bass ? ?Procedure(s) Performed: CYSTOSCOPY WITH PROSTATE FOREIGN BODY REMOVAL ?TRANSURETHRAL INCISION OF THE PROSTATE (TUIP) ? ?Patient location during evaluation: PACU ?Anesthesia Type: General ?Level of consciousness: awake and oriented ?Pain management: satisfactory to patient ?Vital Signs Assessment: post-procedure vital signs reviewed and stable ?Respiratory status: spontaneous breathing and nonlabored ventilation ?Cardiovascular status: stable ?Anesthetic complications: no ? ? ?No notable events documented. ? ? ?Last Vitals:  ?Vitals:  ? 10/04/21 1427 10/04/21 1459  ?BP: 122/78 104/64  ?Pulse: 76 (!) 56  ?Resp: 20 16  ?Temp: (!) 36.1 ?C   ?SpO2: 94% 97%  ?  ?Last Pain:  ?Vitals:  ? 10/04/21 1459  ?TempSrc:   ?PainSc: 3   ? ? ?  ?  ?  ?  ?  ?  ? ?VAN STAVEREN,[name] ? ? ? ? ?

## 2021-10-06 NOTE — Progress Notes (Signed)
Catheter Removal ? ?Patient is present today for a catheter removal.  47m of water was drained from the balloon. A 20FR foley cath was removed from the bladder no complications were noted . Patient tolerated well. ? ?Performed by: JGaspar ColaCMA ? ? ?

## 2021-11-08 DIAGNOSIS — E039 Hypothyroidism, unspecified: Secondary | ICD-10-CM | POA: Diagnosis not present

## 2021-11-08 DIAGNOSIS — Z79899 Other long term (current) drug therapy: Secondary | ICD-10-CM | POA: Diagnosis not present

## 2021-11-08 DIAGNOSIS — F418 Other specified anxiety disorders: Secondary | ICD-10-CM | POA: Diagnosis not present

## 2021-11-08 DIAGNOSIS — I69319 Unspecified symptoms and signs involving cognitive functions following cerebral infarction: Secondary | ICD-10-CM | POA: Diagnosis not present

## 2021-11-08 DIAGNOSIS — N401 Enlarged prostate with lower urinary tract symptoms: Secondary | ICD-10-CM | POA: Diagnosis not present

## 2021-11-08 DIAGNOSIS — I110 Hypertensive heart disease with heart failure: Secondary | ICD-10-CM | POA: Diagnosis not present

## 2021-11-08 DIAGNOSIS — J449 Chronic obstructive pulmonary disease, unspecified: Secondary | ICD-10-CM | POA: Diagnosis not present

## 2021-11-08 DIAGNOSIS — E1151 Type 2 diabetes mellitus with diabetic peripheral angiopathy without gangrene: Secondary | ICD-10-CM | POA: Diagnosis not present

## 2021-11-08 DIAGNOSIS — G4761 Periodic limb movement disorder: Secondary | ICD-10-CM | POA: Diagnosis not present

## 2021-11-10 ENCOUNTER — Ambulatory Visit: Payer: Medicare HMO | Admitting: Urology

## 2021-11-10 ENCOUNTER — Encounter: Payer: Self-pay | Admitting: Urology

## 2021-11-10 DIAGNOSIS — E782 Mixed hyperlipidemia: Secondary | ICD-10-CM | POA: Diagnosis not present

## 2021-11-10 DIAGNOSIS — I5022 Chronic systolic (congestive) heart failure: Secondary | ICD-10-CM | POA: Diagnosis not present

## 2021-11-10 DIAGNOSIS — R3914 Feeling of incomplete bladder emptying: Secondary | ICD-10-CM | POA: Diagnosis not present

## 2021-11-10 DIAGNOSIS — N401 Enlarged prostate with lower urinary tract symptoms: Secondary | ICD-10-CM

## 2021-11-10 DIAGNOSIS — I70209 Unspecified atherosclerosis of native arteries of extremities, unspecified extremity: Secondary | ICD-10-CM | POA: Diagnosis not present

## 2021-11-10 DIAGNOSIS — I10 Essential (primary) hypertension: Secondary | ICD-10-CM | POA: Diagnosis not present

## 2021-11-10 DIAGNOSIS — I2510 Atherosclerotic heart disease of native coronary artery without angina pectoris: Secondary | ICD-10-CM | POA: Diagnosis not present

## 2021-11-10 DIAGNOSIS — I6523 Occlusion and stenosis of bilateral carotid arteries: Secondary | ICD-10-CM | POA: Diagnosis not present

## 2021-11-10 LAB — BLADDER SCAN AMB NON-IMAGING: Scan Result: 108

## 2021-11-10 NOTE — Progress Notes (Signed)
? ?11/10/2021 ?12:15 PM  ? ?[name] ?1946/06/30 ?[id number] ? ?Referring provider: [name], NP ?[address] ?[place],  [place] [address] ? ?Chief Complaint  ?Patient presents with  ? Benign Prostatic Hypertrophy  ? ? ?HPI: ?76 y.o. male presents for postop follow-up. ? ?s/p removal of encrusted UroLift implant and transurethral incision prostate 10/04/2021 ?States he is voiding much better and feels he is emptying his bladder ?Does note mild urinary incontinence not related to urge ?Is not wearing pads ?Has stopped tamsulosin without worsening voiding symptoms ? ? ?PMH: ?Past Medical History:  ?Diagnosis Date  ? Accelerated junctional rhythm   ? Anemia   ? Anxiety   ? a.) Tx'd with BZO PRN  ? Arthritis   ? Bilateral carotid artery disease ([place])   ? BPH (benign prostatic hypertrophy)   ? CAD S/P percutaneous coronary angioplasty   ? a.) PCI in 2002 placing a RCA stent (unknown type). b.) LHC 12/07/2015: 5% ISR m-dRCA, 90% dRCA, 25% pRCA, 25% p-mLCx, 100% OM3, 95% oOM2-OM2, 75% m-dLAD; refer to CVTS. c.) 3v CABG 01/03/2016  ? CHF (congestive heart failure) ([place])   ? a.) TTE 05/12/2012: EF 50-55%; mild LA enlargement; G1DD. b.)  TTE 11/26/2015: EF 45%; mild BAE; triv PR, mild MR/TR; inferolateral HK; G1DD. c.)  TTE 11/07/2017: EF 35%; RV enlargement; BAE; inferior and inferolateral HK; triv PR; mild MR/TR. d.)  TTE 09/17/2019: EF 35%; mild LVH; triv MR/TR. e.)  TTE 08/11/2021: EF 50%; triv MR/TR; G1DD.  ? Cognitive deficit following cerebrovascular accident (CVA)   ? COPD (chronic obstructive pulmonary disease) ([place])   ? Cortical cataract   ? Depression   ? Dyspnea   ? GERD (gastroesophageal reflux disease)   ? History of 2019 novel coronavirus disease (COVID-19) 01/19/2021  ? History of acute inferior wall MI 2002  ? a.) PCI was performed placing a RCA stent (unknown type)  ? History of kidney stones   ? HTN (hypertension)   ? Hyperlipidemia   ? Hypothyroidism   ? Long term current use of  antithrombotics/antiplatelets   ? a.) DAPT therapy (ASA + clopidogrel)  ? OSA (obstructive sleep apnea)   ? a.) does not require nocturnal PAP therapy  ? PLMD (periodic limb movement disorder)   ? Postoperative atrial fibrillation ([place]) 01/03/2016  ? a.) following CABG procedure  ? Prostatitis   ? PVD (peripheral vascular disease) ([place])   ? 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 a.) LIMA-LAD, SVG-PDA, SVG-OM2  ? Stroke [facility]) 04/2012  ? a.) LEFT M1 occlusion from possible moderate LEFT ICA stenosis; Tx with TPA + mechanical embolectomy with Trevo Provue retrieval device with full recanalization and proximal LEFT ICA rescue stent. b/) residual RIGHT sided weakness  ? T2DM (type 2 diabetes mellitus) ([place])   ? Tobacco abuse   ? ? ?Surgical History: ?Past Surgical History:  ?Procedure Laterality Date  ? CARDIAC CATHETERIZATION N/A 12/07/2015  ? Procedure: Left Heart Cath and Coronary Angiography;  Surgeon: Corey Skains, MD;  Location: [place] CV [facility];  Service: Cardiovascular;  Laterality: N/A;  ? CATARACT EXTRACTION W/PHACO Left 03/24/2021  ? Procedure: CATARACT EXTRACTION PHACO AND INTRAOCULAR LENS PLACEMENT (IOC) LEFT DIABETIC 6.67 00:42.5;  Surgeon: Birder Robson, MD;  Location: [facility] ORS;  Service: Ophthalmology;  Laterality: Left;  ? COLONOSCOPY WITH PROPOFOL N/A 02/18/2019  ? Procedure: COLONOSCOPY WITH PROPOFOL;  Surgeon: Lucilla Lame, MD;  Location: [facility] ENDOSCOPY;  Service: Endoscopy;  Laterality: N/A;  ? CORONARY ANGIOPLASTY WITH STENT PLACEMENT  Left 2002  ? CORONARY ARTERY BYPASS GRAFT N/A 01/03/2016  ? Procedure: 3v CORONARY ARTERY BYPASS GRAFT (LIMA-LAD, SVG-PDA, SVG-OM2); Location: [facility]; Surgeon: Dwaine Deter, MD  ? CYSTOSCOPY W/ URETERAL STENT REMOVAL N/A 10/04/2021  ? Procedure: CYSTOSCOPY WITH PROSTATE FOREIGN BODY REMOVAL;  Surgeon: Abbie Sons, MD;  Location: [facility] ORS;  Service: Urology;  Laterality: N/A;  ? CYSTOSCOPY WITH INSERTION OF UROLIFT N/A 04/29/2019  ? Procedure: CYSTOSCOPY WITH  INSERTION OF UROLIFT;  Surgeon: Abbie Sons, MD;  Location: [facility] ORS;  Service: Urology;  Laterality: N/A;  ? ESOPHAGOGASTRODUODENOSCOPY (EGD) WITH PROPOFOL N/A 09/11/2016  ? Procedure: ESOPHAGOGASTRODUODENOSCOPY (EGD) WITH PROPOFOL;  Surgeon: Lucilla Lame, MD;  Location: [facility] ENDOSCOPY;  Service: Endoscopy;  Laterality: N/A;  ? ESOPHAGOGASTRODUODENOSCOPY (EGD) WITH PROPOFOL N/A 10/17/2016  ? Procedure: ESOPHAGOGASTRODUODENOSCOPY (EGD) WITH PROPOFOL;  Surgeon: Lucilla Lame, MD;  Location: [facility] ENDOSCOPY;  Service: Endoscopy;  Laterality: N/A;  ? ESOPHAGOGASTRODUODENOSCOPY (EGD) WITH PROPOFOL N/A 11/13/2017  ? Procedure: ESOPHAGOGASTRODUODENOSCOPY (EGD) WITH PROPOFOL;  Surgeon: Lucilla Lame, MD;  Location: [facility] ENDOSCOPY;  Service: Endoscopy;  Laterality: N/A;  ? pins R lower leg Left   ? rotator cuff replaced Right   ? SAVORY DILATION N/A 10/17/2016  ? Procedure: SAVORY DILATION;  Surgeon: Lucilla Lame, MD;  Location: [facility] ENDOSCOPY;  Service: Endoscopy;  Laterality: N/A;  ? TRANSURETHRAL INCISION OF PROSTATE N/A 10/04/2021  ? Procedure: TRANSURETHRAL INCISION OF THE PROSTATE (TUIP);  Surgeon: Abbie Sons, MD;  Location: [facility] ORS;  Service: Urology;  Laterality: N/A;  ? ? ?Home Medications:  ?Allergies as of 11/10/2021   ?No Known Allergies ?  ? ?  ?Medication List  ?  ? ?  ? Accurate as of November 10, 2021 11:59 PM. If you have any questions, ask your nurse or doctor.  ?  ?  ? ?  ? ?aspirin EC 81 MG tablet ?Take 81 mg by mouth daily. ?  ?atorvastatin 80 MG tablet ?Commonly known as: LIPITOR ?Take 80 mg by mouth daily. ?  ?buPROPion 150 MG 24 hr tablet ?Commonly known as: WELLBUTRIN XL ?Take 150 mg by mouth at bedtime. ?  ?cholecalciferol 25 MCG (1000 UNIT) tablet ?Commonly known as: VITAMIN D3 ?Take 1,000 Units by mouth every evening. ?  ?ciprofloxacin 500 MG tablet ?Commonly known as: CIPRO ?Take 1 tablet (500 mg total) by mouth every 12 (twelve) hours. ?  ?clonazePAM 0.5 MG tablet ?Commonly known as:  KLONOPIN ?Take 0.5 mg by mouth 2 (two) times daily. ?  ?clopidogrel 75 MG tablet ?Commonly known as: PLAVIX ?Take 75 mg by mouth daily. ?  ?CoQ10 100 MG Caps ?Take 100 mg by mouth at bedtime. ?  ?ezetimibe 10 MG tablet ?Commonly known as: ZETIA ?Take 10 mg by mouth daily. ?  ?glucose blood test strip ?  ?Iron 325 (65 Fe) MG Tabs ?Take 325 mg by mouth daily. ?  ?levothyroxine 100 MCG tablet ?Commonly known as: SYNTHROID ?Take 100 mcg by mouth daily before breakfast. ?  ?metFORMIN 500 MG tablet ?Commonly known as: GLUCOPHAGE ?Take 500 mg by mouth 2 (two) times daily. ?  ?metoprolol tartrate 25 MG tablet ?Commonly known as: LOPRESSOR ?Take 25 mg by mouth 2 (two) times daily. ?  ?multivitamin with minerals Tabs tablet ?Take 1 tablet by mouth daily. ?  ?naproxen sodium 220 MG tablet ?Commonly known as: ALEVE ?Take 220-440 mg by mouth 2 (two) times daily as needed (pain.). ?  ?pantoprazole 40 MG tablet ?Commonly known as: PROTONIX ?Take 1 tablet (40 mg total) by mouth  at bedtime. ?  ?psyllium 28 % packet ?Commonly known as: METAMUCIL SMOOTH TEXTURE ?Take 1 packet by mouth daily. ?  ?RA Krill Oil 500 MG Caps ?Take 500 mg by mouth at bedtime. ?  ?spironolactone 25 MG tablet ?Commonly known as: ALDACTONE ?Take 25 mg by mouth daily. ?  ?tamsulosin 0.4 MG Caps capsule ?Commonly known as: FLOMAX ?Take 1 capsule (0.4 mg total) by mouth daily. ?What changed: when to take this ?  ?telmisartan 40 MG tablet ?Commonly known as: MICARDIS ?Take 20 mg by mouth at bedtime. ?  ?venlafaxine XR 150 MG 24 hr capsule ?Commonly known as: EFFEXOR-XR ?Take 150 mg by mouth daily. ?  ? ?  ? ? ?Allergies: No Known Allergies ? ?Family History: ?Family History  ?Problem Relation Age of Onset  ? Heart failure Father   ? Throat cancer Mother   ? Diabetes Brother   ? ? ?Social History:  reports that he quit smoking about 9 years ago. His smoking use included cigarettes. He smoked an average of 2 packs per day. He has never used smokeless tobacco. He  reports that he does not currently use alcohol. He reports that he does not use drugs. ? ? ?Physical Exam: ?Constitutional:  Alert and oriented, No acute distress. ?HEENT: [place] AT, moist mucus membranes.  Trachea midline, no masses. ?

## 2021-11-14 DIAGNOSIS — G4733 Obstructive sleep apnea (adult) (pediatric): Secondary | ICD-10-CM | POA: Diagnosis not present

## 2021-11-14 DIAGNOSIS — Z8673 Personal history of transient ischemic attack (TIA), and cerebral infarction without residual deficits: Secondary | ICD-10-CM | POA: Diagnosis not present

## 2021-11-14 DIAGNOSIS — R410 Disorientation, unspecified: Secondary | ICD-10-CM | POA: Diagnosis not present

## 2021-11-14 DIAGNOSIS — I69319 Unspecified symptoms and signs involving cognitive functions following cerebral infarction: Secondary | ICD-10-CM | POA: Diagnosis not present

## 2021-11-17 ENCOUNTER — Ambulatory Visit: Payer: Medicare HMO | Admitting: Gastroenterology

## 2021-11-17 ENCOUNTER — Encounter: Payer: Self-pay | Admitting: Gastroenterology

## 2021-11-17 ENCOUNTER — Telehealth: Payer: Self-pay

## 2021-11-17 VITALS — BP 115/63 | Temp 98.5°F | Ht 71.0 in | Wt 206.0 lb

## 2021-11-17 DIAGNOSIS — K219 Gastro-esophageal reflux disease without esophagitis: Secondary | ICD-10-CM | POA: Diagnosis not present

## 2021-11-17 DIAGNOSIS — Z87891 Personal history of nicotine dependence: Secondary | ICD-10-CM | POA: Insufficient documentation

## 2021-11-17 DIAGNOSIS — Z86010 Personal history of colonic polyps: Secondary | ICD-10-CM | POA: Diagnosis not present

## 2021-11-17 DIAGNOSIS — F411 Generalized anxiety disorder: Secondary | ICD-10-CM | POA: Insufficient documentation

## 2021-11-17 DIAGNOSIS — I69319 Unspecified symptoms and signs involving cognitive functions following cerebral infarction: Secondary | ICD-10-CM | POA: Insufficient documentation

## 2021-11-17 MED ORDER — PANTOPRAZOLE SODIUM 40 MG PO TBEC
40.0000 mg | DELAYED_RELEASE_TABLET | Freq: Every day | ORAL | 3 refills | Status: DC
Start: 2021-11-17 — End: 2022-11-06

## 2021-11-17 MED ORDER — PEG 3350-KCL-NA BICARB-NACL 420 G PO SOLR: 4000.0000 mL | Freq: Once | ORAL | 0 refills | Status: AC

## 2021-11-17 NOTE — Telephone Encounter (Signed)
Procedure and blood thinner clearance faxed to Dr Nehemiah Massed cardiology ?

## 2021-11-17 NOTE — Progress Notes (Signed)
? ? ?Primary Care Physician: [name], NP ? ?Primary Gastroenterologist:  Dr. Lucilla Lame ? ?Chief Complaint  ?Patient presents with  ? Follow-up  ? ? ?HPI: Paul Bass is a 76 y.o. male here for refill of his medication.  The patient has been on pantoprazole and is well-controlled with that medication.  The patient denies any dysphagia nausea vomiting fevers chills black stools or bloody stools.  Does appear the patient had 6 polyps at his last colonoscopy 3 years ago and was recommended to have a repeat colonoscopy in 3 years.  ? ?Past Medical History:  ?Diagnosis Date  ? Accelerated junctional rhythm   ? Anemia   ? Anxiety   ? a.) Tx'd with BZO PRN  ? Arthritis   ? Bilateral carotid artery disease ([place])   ? BPH (benign prostatic hypertrophy)   ? CAD S/P percutaneous coronary angioplasty   ? a.) PCI in 2002 placing a RCA stent (unknown type). b.) LHC 12/07/2015: 5% ISR m-dRCA, 90% dRCA, 25% pRCA, 25% p-mLCx, 100% OM3, 95% oOM2-OM2, 75% m-dLAD; refer to CVTS. c.) 3v CABG 01/03/2016  ? CHF (congestive heart failure) ([place])   ? a.) TTE 05/12/2012: EF 50-55%; mild LA enlargement; G1DD. b.)  TTE 11/26/2015: EF 45%; mild BAE; triv PR, mild MR/TR; inferolateral HK; G1DD. c.)  TTE 11/07/2017: EF 35%; RV enlargement; BAE; inferior and inferolateral HK; triv PR; mild MR/TR. d.)  TTE 09/17/2019: EF 35%; mild LVH; triv MR/TR. e.)  TTE 08/11/2021: EF 50%; triv MR/TR; G1DD.  ? Cognitive deficit following cerebrovascular accident (CVA)   ? COPD (chronic obstructive pulmonary disease) ([place])   ? Cortical cataract   ? Depression   ? Dyspnea   ? GERD (gastroesophageal reflux disease)   ? History of 2019 novel coronavirus disease (COVID-19) 01/19/2021  ? History of acute inferior wall MI 2002  ? a.) PCI was performed placing a RCA stent (unknown type)  ? History of kidney stones   ? HTN (hypertension)   ? Hyperlipidemia   ? Hypothyroidism   ? Long term current use of antithrombotics/antiplatelets   ? a.) DAPT therapy  (ASA + clopidogrel)  ? OSA (obstructive sleep apnea)   ? a.) does not require nocturnal PAP therapy  ? PLMD (periodic limb movement disorder)   ? Postoperative atrial fibrillation ([place]) 01/03/2016  ? a.) following CABG procedure  ? Prostatitis   ? PVD (peripheral vascular disease) ([place])   ? 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 a.) LIMA-LAD, SVG-PDA, SVG-OM2  ? Stroke [facility]) 04/2012  ? a.) LEFT M1 occlusion from possible moderate LEFT ICA stenosis; Tx with TPA + mechanical embolectomy with Trevo Provue retrieval device with full recanalization and proximal LEFT ICA rescue stent. b/) residual RIGHT sided weakness  ? T2DM (type 2 diabetes mellitus) ([place])   ? Tobacco abuse   ? ? ?Current Outpatient Medications  ?Medication Sig Dispense Refill  ? aspirin EC 81 MG tablet Take 81 mg by mouth daily.    ? atorvastatin (LIPITOR) 80 MG tablet Take 80 mg by mouth daily.     ? buPROPion (WELLBUTRIN XL) 150 MG 24 hr tablet Take 150 mg by mouth at bedtime.    ? cholecalciferol (VITAMIN D3) 25 MCG (1000 UT) tablet Take 1,000 Units by mouth every evening.    ? ciprofloxacin (CIPRO) 500 MG tablet Take 1 tablet (500 mg total) by mouth every 12 (twelve) hours. 14 tablet 0  ? clonazePAM (KLONOPIN) 0.5 MG tablet Take 0.5 mg by mouth  2 (two) times daily.    ? clopidogrel (PLAVIX) 75 MG tablet Take 75 mg by mouth daily.   1  ? Coenzyme Q10 (COQ10) 100 MG CAPS Take 100 mg by mouth at bedtime.    ? ezetimibe (ZETIA) 10 MG tablet Take 10 mg by mouth daily.    ? Ferrous Sulfate (IRON) 325 (65 Fe) MG TABS Take 325 mg by mouth daily.    ? glucose blood test strip     ? levothyroxine (SYNTHROID, LEVOTHROID) 100 MCG tablet Take 100 mcg by mouth daily before breakfast.    ? metFORMIN (GLUCOPHAGE) 500 MG tablet Take 500 mg by mouth 2 (two) times daily.     ? metoprolol tartrate (LOPRESSOR) 25 MG tablet Take 25 mg by mouth 2 (two) times daily.    ? Multiple Vitamin (MULTIVITAMIN WITH MINERALS) TABS Take 1 tablet by mouth daily.    ? naproxen sodium (ALEVE) 220  MG tablet Take 220-440 mg by mouth 2 (two) times daily as needed (pain.).    ? polyethylene glycol-electrolytes (NULYTELY) 420 g solution Take 4,000 mLs by mouth once for 1 dose. 4000 mL 0  ? psyllium (METAMUCIL SMOOTH TEXTURE) 28 % packet Take 1 packet by mouth daily.    ? RA KRILL OIL 500 MG CAPS Take 500 mg by mouth at bedtime.     ? spironolactone (ALDACTONE) 25 MG tablet Take 25 mg by mouth daily.    ? tamsulosin (FLOMAX) 0.4 MG CAPS capsule Take 1 capsule (0.4 mg total) by mouth daily. (Patient taking differently: Take 0.4 mg by mouth in the morning and at bedtime.) 90 capsule 0  ? telmisartan (MICARDIS) 40 MG tablet Take 20 mg by mouth at bedtime.    ? venlafaxine XR (EFFEXOR-XR) 150 MG 24 hr capsule Take 150 mg by mouth daily.    ? pantoprazole (PROTONIX) 40 MG tablet Take 1 tablet (40 mg total) by mouth at bedtime. 90 tablet 3  ? ?No current facility-administered medications for this visit.  ? ? ?Allergies as of 11/17/2021  ? (No Known Allergies)  ? ? ?ROS: ? ?General: Negative for anorexia, weight loss, fever, chills, fatigue, weakness. ?ENT: Negative for hoarseness, difficulty swallowing , nasal congestion. ?CV: Negative for chest pain, angina, palpitations, dyspnea on exertion, peripheral edema.  ?Respiratory: Negative for dyspnea at rest, dyspnea on exertion, cough, sputum, wheezing.  ?GI: See history of present illness. ?GU:  Negative for dysuria, hematuria, urinary incontinence, urinary frequency, nocturnal urination.  ?Endo: Negative for unusual weight change.  ?  ?Physical Examination: ? ? BP 115/63   Temp 98.5 ?F (36.9 ?C) (Oral)   Ht '5\' 11"'$  (1.803 m)   Wt 206 lb (93.4 kg)   BMI 28.73 kg/m?  ? ?General: Well-nourished, well-developed in no acute distress.  ?Eyes: No icterus. Conjunctivae pink. ?Neuro: Alert and oriented x 3.  Grossly intact. ?Skin: Warm and dry, no jaundice.   ?Psych: Alert and cooperative, normal mood and affect. ? ?Labs:  ?  ?Imaging Studies: ?No results found. ? ?Assessment  and Plan:  ? ?Paul Bass is a 76 y.o. y/o male who comes in today with a history of GERD and will have his pantoprazole refilled.  The patient will also be set up for colonoscopy due to his previous colostomy showing 6 adenomas.  The patient has been explained the plan and agrees with it.  The patient will follow up at the time of colonoscopy. ? ? ? ? ?Lucilla Lame, MD. Marval Regal ? ? ? Note:  This dictation was prepared with Dragon dictation along with smaller phrase technology. Any transcriptional errors that result from this process are unintentional.  ?

## 2021-11-22 NOTE — Telephone Encounter (Signed)
Clearance received via fax and states pt will need to stop Plavix 3 days prior to procedure and resume 1 day after procedure ?

## 2021-11-30 DIAGNOSIS — E785 Hyperlipidemia, unspecified: Secondary | ICD-10-CM | POA: Diagnosis not present

## 2021-11-30 DIAGNOSIS — I6523 Occlusion and stenosis of bilateral carotid arteries: Secondary | ICD-10-CM | POA: Diagnosis not present

## 2021-11-30 DIAGNOSIS — E119 Type 2 diabetes mellitus without complications: Secondary | ICD-10-CM | POA: Diagnosis not present

## 2021-11-30 DIAGNOSIS — I10 Essential (primary) hypertension: Secondary | ICD-10-CM | POA: Diagnosis not present

## 2021-11-30 NOTE — Telephone Encounter (Signed)
Left detailed msg on VM per HIPAA  

## 2021-12-05 DIAGNOSIS — I10 Essential (primary) hypertension: Secondary | ICD-10-CM | POA: Diagnosis not present

## 2021-12-05 DIAGNOSIS — I6523 Occlusion and stenosis of bilateral carotid arteries: Secondary | ICD-10-CM | POA: Diagnosis not present

## 2021-12-05 DIAGNOSIS — E782 Mixed hyperlipidemia: Secondary | ICD-10-CM | POA: Diagnosis not present

## 2021-12-05 DIAGNOSIS — I70209 Unspecified atherosclerosis of native arteries of extremities, unspecified extremity: Secondary | ICD-10-CM | POA: Diagnosis not present

## 2021-12-05 DIAGNOSIS — I2510 Atherosclerotic heart disease of native coronary artery without angina pectoris: Secondary | ICD-10-CM | POA: Diagnosis not present

## 2021-12-07 NOTE — Telephone Encounter (Signed)
Pt is aware as instructed and expressed understanding 

## 2021-12-19 ENCOUNTER — Telehealth: Payer: Self-pay | Admitting: Gastroenterology

## 2021-12-19 NOTE — Telephone Encounter (Signed)
I spoke to pt's wife, ok per DPR... went over instructions and she expressed understanding, nothing further needed at this time

## 2021-12-19 NOTE — Telephone Encounter (Signed)
Patient spouse called with questions about colonoscopy. Requesting a call back.

## 2021-12-22 ENCOUNTER — Ambulatory Visit: Payer: Medicare HMO | Admitting: Anesthesiology

## 2021-12-22 ENCOUNTER — Ambulatory Visit
Admission: RE | Admit: 2021-12-22 | Discharge: 2021-12-22 | Disposition: A | Discharge status: Home / Self Care | Payer: Medicare HMO | Attending: Gastroenterology | Admitting: Gastroenterology

## 2021-12-22 ENCOUNTER — Encounter: Payer: Self-pay | Admitting: Gastroenterology

## 2021-12-22 ENCOUNTER — Encounter
Admission: RE | Disposition: A | Discharge status: Home / Self Care | Payer: Self-pay | Source: Home / Self Care | Attending: Gastroenterology

## 2021-12-22 DIAGNOSIS — F419 Anxiety disorder, unspecified: Secondary | ICD-10-CM | POA: Insufficient documentation

## 2021-12-22 DIAGNOSIS — E039 Hypothyroidism, unspecified: Secondary | ICD-10-CM | POA: Diagnosis not present

## 2021-12-22 DIAGNOSIS — I509 Heart failure, unspecified: Secondary | ICD-10-CM | POA: Insufficient documentation

## 2021-12-22 DIAGNOSIS — K5730 Diverticulosis of large intestine without perforation or abscess without bleeding: Secondary | ICD-10-CM | POA: Insufficient documentation

## 2021-12-22 DIAGNOSIS — D120 Benign neoplasm of cecum: Secondary | ICD-10-CM | POA: Insufficient documentation

## 2021-12-22 DIAGNOSIS — K635 Polyp of colon: Secondary | ICD-10-CM | POA: Diagnosis not present

## 2021-12-22 DIAGNOSIS — Z860100 Personal history of colon polyps, unspecified: Secondary | ICD-10-CM

## 2021-12-22 DIAGNOSIS — M1990 Unspecified osteoarthritis, unspecified site: Secondary | ICD-10-CM | POA: Diagnosis not present

## 2021-12-22 DIAGNOSIS — I110 Hypertensive heart disease with heart failure: Secondary | ICD-10-CM | POA: Insufficient documentation

## 2021-12-22 DIAGNOSIS — Z955 Presence of coronary angioplasty implant and graft: Secondary | ICD-10-CM | POA: Diagnosis not present

## 2021-12-22 DIAGNOSIS — F32A Depression, unspecified: Secondary | ICD-10-CM | POA: Diagnosis not present

## 2021-12-22 DIAGNOSIS — Z1211 Encounter for screening for malignant neoplasm of colon: Secondary | ICD-10-CM | POA: Insufficient documentation

## 2021-12-22 DIAGNOSIS — G4730 Sleep apnea, unspecified: Secondary | ICD-10-CM | POA: Insufficient documentation

## 2021-12-22 DIAGNOSIS — D123 Benign neoplasm of transverse colon: Secondary | ICD-10-CM | POA: Diagnosis not present

## 2021-12-22 DIAGNOSIS — D122 Benign neoplasm of ascending colon: Secondary | ICD-10-CM | POA: Diagnosis not present

## 2021-12-22 DIAGNOSIS — J449 Chronic obstructive pulmonary disease, unspecified: Secondary | ICD-10-CM | POA: Insufficient documentation

## 2021-12-22 DIAGNOSIS — K219 Gastro-esophageal reflux disease without esophagitis: Secondary | ICD-10-CM | POA: Diagnosis not present

## 2021-12-22 DIAGNOSIS — R0602 Shortness of breath: Secondary | ICD-10-CM | POA: Insufficient documentation

## 2021-12-22 DIAGNOSIS — Z951 Presence of aortocoronary bypass graft: Secondary | ICD-10-CM | POA: Diagnosis not present

## 2021-12-22 DIAGNOSIS — E1151 Type 2 diabetes mellitus with diabetic peripheral angiopathy without gangrene: Secondary | ICD-10-CM | POA: Insufficient documentation

## 2021-12-22 DIAGNOSIS — D649 Anemia, unspecified: Secondary | ICD-10-CM | POA: Insufficient documentation

## 2021-12-22 DIAGNOSIS — Z7902 Long term (current) use of antithrombotics/antiplatelets: Secondary | ICD-10-CM | POA: Diagnosis not present

## 2021-12-22 DIAGNOSIS — Z87891 Personal history of nicotine dependence: Secondary | ICD-10-CM | POA: Diagnosis not present

## 2021-12-22 DIAGNOSIS — E785 Hyperlipidemia, unspecified: Secondary | ICD-10-CM | POA: Insufficient documentation

## 2021-12-22 HISTORY — PX: COLONOSCOPY WITH PROPOFOL: SHX5780

## 2021-12-22 LAB — GLUCOSE, CAPILLARY: Glucose-Capillary: 198 mg/dL — ABNORMAL HIGH (ref 70–99)

## 2021-12-22 SURGERY — COLONOSCOPY WITH PROPOFOL
Anesthesia: General

## 2021-12-22 MED ORDER — PROPOFOL 500 MG/50ML IV EMUL
INTRAVENOUS | Status: AC
Start: 2021-12-22 — End: ?
  Filled 2021-12-22: qty 50

## 2021-12-22 MED ORDER — PROPOFOL 500 MG/50ML IV EMUL
INTRAVENOUS | Status: DC | PRN
  Administered 2021-12-22: 130 ug/kg/min via INTRAVENOUS

## 2021-12-22 MED ORDER — PROPOFOL 10 MG/ML IV BOLUS
INTRAVENOUS | Status: DC | PRN
  Administered 2021-12-22: 30 mg via INTRAVENOUS
  Administered 2021-12-22: 70 mg via INTRAVENOUS

## 2021-12-22 MED ORDER — SODIUM CHLORIDE 0.9 % IV SOLN: INTRAVENOUS | Status: DC

## 2021-12-22 MED ORDER — LIDOCAINE HCL (CARDIAC) PF 100 MG/5ML IV SOSY
PREFILLED_SYRINGE | INTRAVENOUS | Status: DC | PRN
  Administered 2021-12-22: 50 mg via INTRAVENOUS

## 2021-12-22 MED ORDER — PROPOFOL 10 MG/ML IV BOLUS
INTRAVENOUS | Status: AC
  Filled 2021-12-22: qty 40

## 2021-12-22 NOTE — Anesthesia Postprocedure Evaluation (Signed)
Anesthesia Post Note  Patient: Paul Bass  Procedure(s) Performed: COLONOSCOPY WITH PROPOFOL  Patient location during evaluation: PACU Anesthesia Type: General Level of consciousness: awake and alert Pain management: satisfactory to patient Vital Signs Assessment: post-procedure vital signs reviewed and stable Respiratory status: nonlabored ventilation and respiratory function stable Cardiovascular status: stable Anesthetic complications: no   No notable events documented.   Last Vitals:  Vitals:   12/22/21 1029 12/22/21 1039  BP: 133/77 (!) 142/63  Pulse: (!) 50 (!) 50  Resp: 19 19  Temp:    SpO2: 99% 100%    Last Pain:  Vitals:   12/22/21 1039  TempSrc:   PainSc: 0-No pain                 VAN STAVEREN,Samier Jaco

## 2021-12-22 NOTE — Op Note (Signed)
[facility] Gastroenterology Patient Name: Paul Bass Procedure Date: 12/22/2021 9:24 AM MRN: [id number] Account #: [id number] Date of Birth: 1946-01-26 Admit Type: Outpatient Age: 76 Room: [facility] ENDO ROOM 4 Gender: Male Note Status: Finalized Instrument Name: Jasper Riling [id number] Procedure:             Colonoscopy Indications:           High risk colon cancer surveillance: Personal history                         of colonic polyps Providers:             Lucilla Lame MD, MD Referring MD:          Juluis Rainier (Referring MD) Medicines:             Propofol per Anesthesia Complications:         No immediate complications. Procedure:             Pre-Anesthesia Assessment:                        - Prior to the procedure, a History and Physical was                         performed, and patient medications and allergies were                         reviewed. The patient's tolerance of previous                         anesthesia was also reviewed. The risks and benefits                         of the procedure and the sedation options and risks                         were discussed with the patient. All questions were                         answered, and informed consent was obtained. Prior                         Anticoagulants: The patient has taken no previous                         anticoagulant or antiplatelet agents. ASA Grade                         Assessment: II - A patient with mild systemic disease.                         After reviewing the risks and benefits, the patient                         was deemed in satisfactory condition to undergo the                         procedure.  After obtaining informed consent, the colonoscope was                         passed under direct vision. Throughout the procedure,                         the patient's blood pressure, pulse, and oxygen                         saturations were  monitored continuously. The                         Colonoscope was introduced through the anus and                         advanced to the the cecum, identified by appendiceal                         orifice and ileocecal valve. The colonoscopy was                         performed without difficulty. The patient tolerated                         the procedure well. The quality of the bowel                         preparation was excellent. Findings:      The perianal and digital rectal examinations were normal.      Two sessile polyps were found in the cecum. The polyps were 1 to 2 mm in       size. These polyps were removed with a cold biopsy forceps. Resection       and retrieval were complete.      A 7 mm polyp was found in the ascending colon. The polyp was sessile.       The polyp was removed with a cold snare. Resection and retrieval were       complete.      A 3 mm polyp was found in the ascending colon. The polyp was sessile.       The polyp was removed with a cold biopsy forceps. Resection and       retrieval were complete.      Three sessile polyps were found in the transverse colon. The polyps were       3 to 4 mm in size. These polyps were removed with a cold snare.       Resection and retrieval were complete.      Multiple small-mouthed diverticula were found in the entire colon. Impression:            - Two 1 to 2 mm polyps in the cecum, removed with a                         cold biopsy forceps. Resected and retrieved.                        - One 7 mm polyp in the ascending colon, removed with  a cold snare. Resected and retrieved.                        - One 3 mm polyp in the ascending colon, removed with                         a cold biopsy forceps. Resected and retrieved.                        - Three 3 to 4 mm polyps in the transverse colon,                         removed with a cold snare. Resected and retrieved.                        -  Diverticulosis in the entire examined colon. Recommendation:        - Discharge patient to home.                        - Resume previous diet.                        - Await pathology results.                        - Repeat colonoscopy is not recommended for                         surveillance. Procedure Code(s):     --- Professional ---                        [phone], Colonoscopy, flexible; with removal of                         tumor(s), polyp(s), or other lesion(s) by snare                         technique                        45380, 38, Colonoscopy, flexible; with biopsy, single                         or multiple Diagnosis Code(s):     --- Professional ---                        Z86.010, Personal history of colonic polyps                        K63.5, Polyp of colon CPT copyright 2019 [employer]. All rights reserved. The codes documented in this report are preliminary and upon coder review may  be revised to meet current compliance requirements. Lucilla Lame MD, MD 12/22/2021 10:11:41 AM This report has been signed electronically. Number of Addenda: 0 Note Initiated On: 12/22/2021 9:24 AM Scope Withdrawal Time: 0 hours 12 minutes 14 seconds  Total Procedure Duration: 0 hours 17 minutes 3 seconds  Estimated Blood Loss:  Estimated blood loss: none.      [facility]

## 2021-12-22 NOTE — H&P (Signed)
Paul Lame, MD [facility] [address] [place],  [address] Phone:[phone] Fax : [phone]  Primary Care Physician:  [name], NP Primary Gastroenterologist:  Dr. Allen Norris  Pre-Procedure History & Physical: HPI:  Paul Bass is a 76 y.o. male is here for an colonoscopy.   Past Medical History:  Diagnosis Date   Accelerated junctional rhythm    Anemia    Anxiety    a.) Tx'd with BZO PRN   Arthritis    Bilateral carotid artery disease ([name])    BPH (benign prostatic hypertrophy)    CAD S/P percutaneous coronary angioplasty    a.) PCI in 2002 placing a RCA stent (unknown type). b.) LHC 12/07/2015: 5% ISR m-dRCA, 90% dRCA, 25% pRCA, 25% p-mLCx, 100% OM3, 95% oOM2-OM2, 75% m-dLAD; refer to CVTS. c.) 3v CABG 01/03/2016   CHF (congestive heart failure) ([place])    a.) TTE 05/12/2012: EF 50-55%; mild LA enlargement; G1DD. b.)  TTE 11/26/2015: EF 45%; mild BAE; triv PR, mild MR/TR; inferolateral HK; G1DD. c.)  TTE 11/07/2017: EF 35%; RV enlargement; BAE; inferior and inferolateral HK; triv PR; mild MR/TR. d.)  TTE 09/17/2019: EF 35%; mild LVH; triv MR/TR. e.)  TTE 08/11/2021: EF 50%; triv MR/TR; G1DD.   Cognitive deficit following cerebrovascular accident (CVA)    COPD (chronic obstructive pulmonary disease) ([name])    Cortical cataract    Depression    Dyspnea    GERD (gastroesophageal reflux disease)    History of 2019 novel coronavirus disease (COVID-19) 01/19/2021   History of acute inferior wall MI 2002   a.) PCI was performed placing a RCA stent (unknown type)   History of kidney stones    HTN (hypertension)    Hyperlipidemia    Hypothyroidism    Long term current use of antithrombotics/antiplatelets    a.) DAPT therapy (ASA + clopidogrel)   OSA (obstructive sleep apnea)    a.) does not require nocturnal PAP therapy   PLMD (periodic limb movement disorder)    Postoperative atrial fibrillation ([place]) 01/03/2016   a.) following CABG procedure    Prostatitis    PVD (peripheral vascular disease) ([place])    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 b/) residual RIGHT sided weakness   T2DM (type 2 diabetes mellitus) ([place])    Tobacco abuse     Past Surgical History:  Procedure Laterality Date   CARDIAC CATHETERIZATION N/A 12/07/2015   Procedure: Left Heart Cath and Coronary Angiography;  Surgeon: Corey Skains, MD;  Location: [facility];  Service: Cardiovascular;  Laterality: N/A;   CATARACT EXTRACTION W/PHACO Left 03/24/2021   Procedure: CATARACT EXTRACTION PHACO AND INTRAOCULAR LENS PLACEMENT (IOC) LEFT DIABETIC 6.67 00:42.5;  Surgeon: Birder Robson, MD;  Location: [facility];  Service: Ophthalmology;  Laterality: Left;   COLONOSCOPY WITH PROPOFOL N/A 02/18/2019   Procedure: COLONOSCOPY WITH PROPOFOL;  Surgeon: Paul Lame, MD;  Location: [facility] ENDOSCOPY;  Service: Endoscopy;  Laterality: N/A;   CORONARY ANGIOPLASTY WITH STENT PLACEMENT Left 2002   CORONARY ARTERY BYPASS GRAFT N/A 01/03/2016   Procedure: 3v CORONARY ARTERY BYPASS GRAFT (LIMA-LAD, SVG-PDA, SVG-OM2); Location: [facility]; Surgeon: Dwaine Deter, MD   CYSTOSCOPY W/ URETERAL STENT REMOVAL N/A 10/04/2021   Procedure: CYSTOSCOPY WITH PROSTATE FOREIGN BODY REMOVAL;  Surgeon: Abbie Sons, MD;  Location: [facility];  Service: Urology;  Laterality: N/A;   CYSTOSCOPY WITH INSERTION OF UROLIFT N/A 04/29/2019   Procedure: CYSTOSCOPY WITH INSERTION OF UROLIFT;  Surgeon: Abbie Sons, MD;  Location: [facility];  Service: Urology;  Laterality: N/A;   ESOPHAGOGASTRODUODENOSCOPY (EGD) WITH PROPOFOL N/A 09/11/2016   Procedure: ESOPHAGOGASTRODUODENOSCOPY (EGD) WITH PROPOFOL;  Surgeon: Paul Lame, MD;  Location: [facility] ENDOSCOPY;  Service:  Endoscopy;  Laterality: N/A;   ESOPHAGOGASTRODUODENOSCOPY (EGD) WITH PROPOFOL N/A 10/17/2016   Procedure: ESOPHAGOGASTRODUODENOSCOPY (EGD) WITH PROPOFOL;  Surgeon: Paul Lame, MD;  Location: [facility] ENDOSCOPY;  Service: Endoscopy;  Laterality: N/A;   ESOPHAGOGASTRODUODENOSCOPY (EGD) WITH PROPOFOL N/A 11/13/2017   Procedure: ESOPHAGOGASTRODUODENOSCOPY (EGD) WITH PROPOFOL;  Surgeon: Paul Lame, MD;  Location: [facility] ENDOS[facility];  Service: Endoscopy;  Laterality: N/A;   pins R lower leg Left    rotator cuff replaced Right    SAVORY DILATION N/A 10/17/2016   Procedure: SAVORY DILATION;  Surgeon: Paul Lame, MD;  Location: [facility] ENDOSCOPY;  Service: Endoscopy;  Laterality: N/A;   TRANSURETHRAL INCISION OF PROSTATE N/A 10/04/2021   Procedure: TRANSURETHRAL INCISION OF THE PROSTATE (TUIP);  Surgeon: Abbie Sons, MD;  Location: [facility];  Service: Urology;  Laterality: N/A;    Prior to Admission medications   Medication Sig Start Date End Date Taking? Authorizing Provider  atorvastatin (LIPITOR) 80 MG tablet Take 80 mg by mouth daily.  01/09/16  Yes [provider]  levothyroxine (SYNTHROID, LEVOTHROID) 100 MCG tablet Take 100 mcg by mouth daily before breakfast.   Yes [provider]  metoprolol tartrate (LOPRESSOR) 25 MG tablet Take 25 mg by mouth 2 (two) times daily.   Yes [provider]  venlafaxine XR (EFFEXOR-XR) 150 MG 24 hr capsule Take 150 mg by mouth daily. 06/29/21  Yes [provider]  aspirin EC 81 MG tablet Take 81 mg by mouth daily. Patient not taking: Reported on 12/22/2021 02/05/20   [provider]  buPROPion (WELLBUTRIN XL) 150 MG 24 hr tablet Take 150 mg by mouth at bedtime. 04/07/20   [provider]  cholecalciferol (VITAMIN D3) 25 MCG (1000 UT) tablet Take 1,000 Units by mouth every evening.    [provider]  ciprofloxacin (CIPRO) 500 MG tablet Take 1 tablet (500 mg total) by mouth every 12 (twelve) hours. 09/28/21    Stoioff, Ronda Fairly, MD  clonazePAM (KLONOPIN) 0.5 MG tablet Take 0.5 mg by mouth 2 (two) times daily.    [provider]  clopidogrel (PLAVIX) 75 MG tablet Take 75 mg by mouth daily.  12/21/15   [provider]  Coenzyme Q10 (COQ10) 100 MG CAPS Take 100 mg by mouth at bedtime.    [provider]  ezetimibe (ZETIA) 10 MG tablet Take 10 mg by mouth daily.    [provider]  Ferrous Sulfate (IRON) 325 (65 Fe) MG TABS Take 325 mg by mouth daily.    [provider]  glucose blood test strip     [provider]  metFORMIN (GLUCOPHAGE) 500 MG tablet Take 500 mg by mouth 2 (two) times daily.  09/07/14   [provider]  Multiple Vitamin (MULTIVITAMIN WITH MINERALS) TABS Take 1 tablet by mouth daily.    [provider]  naproxen sodium (ALEVE) 220 MG tablet Take 220-440 mg by mouth 2 (two) times daily as needed (pain.).    [provider]  pantoprazole (PROTONIX) 40 MG tablet Take 1 tablet (40 mg total) by mouth at  bedtime. 11/17/21   Paul Lame, MD  psyllium (METAMUCIL SMOOTH TEXTURE) 28 % packet Take 1 packet by mouth daily.    [provider]  RA KRILL OIL 500 MG CAPS Take 500 mg by mouth at bedtime.     [provider]  spironolactone (ALDACTONE) 25 MG tablet Take 25 mg by mouth daily. 04/12/20   [provider]  tamsulosin (FLOMAX) 0.4 MG CAPS capsule Take 1 capsule (0.4 mg total) by mouth daily. Patient taking differently: Take 0.4 mg by mouth in the morning and at bedtime. 05/30/21   Stoioff, Ronda Fairly, MD  telmisartan (MICARDIS) 40 MG tablet Take 20 mg by mouth at bedtime. 04/01/21   [provider]    Allergies as of 11/17/2021   (No Known Allergies)    Family History  Problem Relation Age of Onset   Heart failure Father    Throat cancer Mother    Diabetes Brother     Social History   Socioeconomic History   Marital status: Married    Spouse name: Vaughan Basta   Number of children:  1   Years of education: HS   Highest education level: Not on file  Occupational History   Occupation: Retired    Comment: Part Time [employer]  Tobacco Use   Smoking status: Former    Packs/day: 2.00    Types: Cigarettes    Quit date: 05/20/2012    Years since quitting: 9.5   Smokeless tobacco: Never  Vaping Use   Vaping Use: Never used  Substance and Sexual Activity   Alcohol use: Not Currently    Comment: weekend drinker, had 5 beers on 05/11/12, prior to stroke, No alcohol since stroke   Drug use: Never   Sexual activity: Yes    Birth control/protection: None  Other Topics Concern   Not on file  Social History Narrative   Patient lives at home with his wife works at  [employer] has a 12 grade education with 1 child.   Patient quit smoking 05-11-2012 patient drinks alcoholic drinks he also drinks cafinted drinks daily.   Social Determinants of Health   Financial Resource Strain: Not on file  Food Insecurity: Not on file  Transportation Needs: Not on file  Physical Activity: Not on file  Stress: Not on file  Social Connections: Not on file  Intimate Partner Violence: Not on file    Review of Systems: See HPI, otherwise negative ROS  Physical Exam: BP 137/76   Pulse (!) 50   Temp (!) 97.5 F (36.4 C) (Temporal)   Resp 18   Ht 6' (1.829 m)   Wt 99.8 kg   SpO2 97%   BMI 29.84 kg/m  General:   Alert,  pleasant and cooperative in NAD Head:  Normocephalic and atraumatic. Neck:  Supple; no masses or thyromegaly. Lungs:  Clear throughout to auscultation.    Heart:  Regular rate and rhythm. Abdomen:  Soft, nontender and nondistended. Normal bowel sounds, without guarding, and without rebound.   Neurologic:  Alert and  oriented x4;  grossly normal neurologically.  Impression/Plan: Paul Bass is here for an colonoscopy to be performed for a history of adenomatous polyps on 2020    Risks, benefits, limitations, and alternatives regarding  colonoscopy have been  reviewed with the patient.  Questions have been answered.  All parties agreeable.   Paul Lame, MD  12/22/2021, 9:24 AM

## 2021-12-22 NOTE — Anesthesia Preprocedure Evaluation (Signed)
Anesthesia Evaluation  Patient identified by MRN, date of birth, ID band Patient awake    Reviewed: Allergy & Precautions, NPO status , Patient's Chart, lab work & pertinent test results  Airway Mallampati: II  TM Distance: >3 FB Neck ROM: full    Dental  (+) Upper Dentures, Dental Advisory Given   Pulmonary neg pulmonary ROS, shortness of breath and with exertion, sleep apnea and Continuous Positive Airway Pressure Ventilation , COPD, former smoker,    Pulmonary exam normal  + decreased breath sounds      Cardiovascular Exercise Tolerance: Poor hypertension, Pt. on medications + CABG and +CHF  negative cardio ROS Normal cardiovascular exam Rhythm:Regular     Neuro/Psych Anxiety Depression CVA negative neurological ROS  negative psych ROS   GI/Hepatic negative GI ROS, Neg liver ROS, GERD  ,  Endo/Other  negative endocrine ROSdiabetes, Type 2  Renal/GU negative Renal ROS  negative genitourinary   Musculoskeletal  (+) Arthritis ,   Abdominal Normal abdominal exam  (+)   Peds negative pediatric ROS (+)  Hematology negative hematology ROS (+) Blood dyscrasia, anemia ,   Anesthesia Other Findings Past Medical History: No date: Accelerated junctional rhythm No date: Anemia No date: Anxiety     Comment:  a.) Tx'd with BZO PRN No date: Arthritis No date: Bilateral carotid artery disease ([name]) No date: BPH (benign prostatic hypertrophy) No date: CAD S/P percutaneous coronary angioplasty     Comment:  a.) PCI in 2002 placing a RCA stent (unknown type). b.)               LHC 12/07/2015: 5% ISR m-dRCA, 90% dRCA, 25% pRCA, 25%               p-mLCx, 100% OM3, 95% oOM2-OM2, 75% m-dLAD; refer to               CVTS. c.) 3v CABG 01/03/2016 No date: CHF (congestive heart failure) ([place])     Comment:  a.) TTE 05/12/2012: EF 50-55%; mild LA enlargement;               G1DD. b.)  TTE 11/26/2015: EF 45%; mild BAE; triv PR,                mild MR/TR; inferolateral HK; G1DD. c.)  TTE 11/07/2017:               EF 35%; RV enlargement; BAE; inferior and inferolateral               HK; triv PR; mild MR/TR. d.)  TTE 09/17/2019: EF 35%;               mild LVH; triv MR/TR. e.)  TTE 08/11/2021: EF 50%; triv               MR/TR; G1DD. No date: Cognitive deficit following cerebrovascular accident (CVA) No date: COPD (chronic obstructive pulmonary disease) ([name]) No date: Cortical cataract No date: Depression No date: Dyspnea No date: GERD (gastroesophageal reflux disease) 01/19/2021: History of 2019 novel coronavirus disease (COVID-19) 2002: History of acute inferior wall MI     Comment:  a.) PCI was performed placing a RCA stent (unknown type) No date: History of kidney stones No date: HTN (hypertension) No date: Hyperlipidemia No date: Hypothyroidism No date: Long term current use of antithrombotics/antiplatelets     Comment:  a.) DAPT therapy (ASA + clopidogrel) No date: OSA (obstructive sleep apnea)     Comment:  a.) does not require nocturnal  PAP therapy No date: PLMD (periodic limb movement disorder) 01/03/2016: Postoperative atrial fibrillation ([place])     Comment:  a.) following CABG procedure No date: Prostatitis No date: PVD (peripheral vascular disease) ([place]) 01/03/2016: S/P CABG x 3     Comment:  a.) LIMA-LAD, SVG-PDA, SVG-OM2 04/2012: Stroke ([place])     Comment:  a.) LEFT M1 occlusion from possible moderate LEFT ICA               stenosis; Tx with TPA + mechanical embolectomy with Trevo              Provue retrieval device with full recanalization and               proximal LEFT ICA rescue stent. b/) residual RIGHT sided               weakness No date: T2DM (type 2 diabetes mellitus) ([place]) No date: Tobacco abuse  Past Surgical History: 12/07/2015: CARDIAC CATHETERIZATION; N/A     Comment:  Procedure: Left Heart Cath and Coronary Angiography;                Surgeon: Corey Skains, MD;  Location: [facility]               CV [facility];  Service: Cardiovascular;  Laterality: N/A; 03/24/2021: CATARACT EXTRACTION W/PHACO; Left     Comment:  Procedure: CATARACT EXTRACTION PHACO AND INTRAOCULAR               LENS PLACEMENT (IOC) LEFT DIABETIC 6.67 00:42.5;                Surgeon: Birder Robson, MD;  Location: [facility] OR[facility];                Service: Ophthalmology;  Laterality: Left; 02/18/2019: COLONOSCOPY WITH PROPOFOL; N/A     Comment:  Procedure: COLONOSCOPY WITH PROPOFOL;  Surgeon: Lucilla Lame, MD;  Location: [facility] ENDOSCOPY;  Service:               Endoscopy;  Laterality: N/A; 2002: CORONARY ANGIOPLASTY WITH STENT PLACEMENT; Left 01/03/2016: CORONARY ARTERY BYPASS GRAFT; N/A     Comment:  Procedure: 3v CORONARY ARTERY BYPASS GRAFT (LIMA-LAD,               SVG-PDA, SVG-OM2); Location: [facility]; Surgeon: Dwaine Deter, MD 10/04/2021: Consuela Mimes W/ URETERAL STENT REMOVAL; N/A     Comment:  Procedure: CYSTOSCOPY WITH PROSTATE FOREIGN BODY               REMOVAL;  Surgeon: Abbie Sons, MD;  Location: [facility]               ORS;  Service: Urology;  Laterality: N/A; 04/29/2019: CYSTOSCOPY WITH INSERTION OF UROLIFT; N/A     Comment:  Procedure: CYSTOSCOPY WITH INSERTION OF UROLIFT;                Surgeon: Abbie Sons, MD;  Location: [facility] OR[facility];                Service: Urology;  Laterality: N/A; 09/11/2016: ESOPHAGOGASTRODUODENOSCOPY (EGD) WITH PROPOFOL; N/A     Comment:  Procedure: ESOPHAGOGASTRODUODENOSCOPY (EGD) WITH               PROPOFOL;  Surgeon: Lucilla Lame, MD;  Location: [facility]  ENDOSCOPY;  Service: Endoscopy;  Laterality: N/A; 10/17/2016: ESOPHAGOGASTRODUODENOSCOPY (EGD) WITH PROPOFOL; N/A     Comment:  Procedure: ESOPHAGOGASTRODUODENOSCOPY (EGD) WITH               PROPOFOL;  Surgeon: Lucilla Lame, MD;  Location: [facility]               ENDOSCOPY;  Service: Endoscopy;  Laterality: N/A; 11/13/2017: ESOPHAGOGASTRODUODENOSCOPY (EGD) WITH PROPOFOL; N/A      Comment:  Procedure: ESOPHAGOGASTRODUODENOSCOPY (EGD) WITH               PROPOFOL;  Surgeon: Lucilla Lame, MD;  Location: [facility]               ENDOSCOPY;  Service: Endoscopy;  Laterality: N/A; No date: pins R lower leg; Left No date: rotator cuff replaced; Right 10/17/2016: SAVORY DILATION; N/A     Comment:  Procedure: SAVORY DILATION;  Surgeon: Lucilla Lame, MD;                Location: [facility] ENDOSCOPY;  Service: Endoscopy;                Laterality: N/A; 10/04/2021: TRANSURETHRAL INCISION OF PROSTATE; N/A     Comment:  Procedure: TRANSURETHRAL INCISION OF THE PROSTATE               (TUIP);  Surgeon: Abbie Sons, MD;  Location: [facility]               ORS;  Service: Urology;  Laterality: N/A;     Reproductive/Obstetrics negative OB ROS                             Anesthesia Physical Anesthesia Plan  ASA: 3  Anesthesia Plan: General   Post-op Pain Management:    Induction: Intravenous  PONV Risk Score and Plan: Propofol infusion and TIVA  Airway Management Planned: Natural Airway  Additional Equipment:   Intra-op Plan:   Post-operative Plan:   Informed Consent: I have reviewed the patients History and Physical, chart, labs and discussed the procedure including the risks, benefits and alternatives for the proposed anesthesia with the patient or authorized representative who has indicated his/her understanding and acceptance.     Dental Advisory Given  Plan Discussed with: CRNA and Surgeon  Anesthesia Plan Comments:         Anesthesia Quick Evaluation

## 2021-12-22 NOTE — Transfer of Care (Signed)
Immediate Anesthesia Transfer of Care Note  Patient: Paul Bass  Procedure(s) Performed: COLONOSCOPY WITH PROPOFOL  Patient Location: PACU and Endoscopy Unit  Anesthesia Type:General  Level of Consciousness: drowsy and patient cooperative  Airway & Oxygen Therapy: Patient Spontanous Breathing  Post-op Assessment: Report given to RN and Post -op Vital signs reviewed and stable  Post vital signs: Reviewed and stable  Last Vitals:  Vitals Value Taken Time  BP 142/57 12/22/21 1009  Temp 36 C 12/22/21 1009  Pulse 57 12/22/21 1009  Resp 17 12/22/21 1009  SpO2 98 % 12/22/21 1009    Last Pain:  Vitals:   12/22/21 1009  TempSrc: Temporal  PainSc: Asleep         Complications: No notable events documented.

## 2021-12-23 ENCOUNTER — Encounter: Payer: Self-pay | Admitting: Gastroenterology

## 2021-12-23 LAB — SURGICAL PATHOLOGY

## 2021-12-27 ENCOUNTER — Encounter: Payer: Self-pay | Admitting: Gastroenterology

## 2022-01-13 ENCOUNTER — Ambulatory Visit: Payer: Self-pay | Admitting: Urology

## 2022-02-10 ENCOUNTER — Ambulatory Visit: Payer: Medicare HMO | Admitting: Urology

## 2022-02-10 ENCOUNTER — Encounter: Payer: Self-pay | Admitting: Urology

## 2022-02-10 VITALS — BP 129/74 | HR 77 | Ht 71.0 in | Wt 215.0 lb

## 2022-02-10 DIAGNOSIS — N401 Enlarged prostate with lower urinary tract symptoms: Secondary | ICD-10-CM | POA: Diagnosis not present

## 2022-02-10 DIAGNOSIS — R339 Retention of urine, unspecified: Secondary | ICD-10-CM | POA: Diagnosis not present

## 2022-02-10 DIAGNOSIS — R351 Nocturia: Secondary | ICD-10-CM

## 2022-02-10 LAB — BLADDER SCAN AMB NON-IMAGING: Scan Result: 188

## 2022-02-10 MED ORDER — GEMTESA 75 MG PO TABS: 75.0000 mg | ORAL_TABLET | Freq: Every day | ORAL | 0 refills | Status: DC

## 2022-02-10 NOTE — Progress Notes (Unsigned)
02/10/2022 11:38 AM   Paul Bass 10/16/1945 [id number]  Referring provider: [name], NP [address] [place],  [place] [address]  Chief Complaint  Patient presents with   Benign Prostatic Hypertrophy    HPI: 76 y.o. male presents for follow-up.  s/p removal of encrusted UroLift implant and transurethral incision prostate 10/04/2021 Stable voiding symptoms since last visit 11/10/2021 His most bothersome complaint has been nocturia every hour He has a history of obstructive sleep apnea and was on CPAP for 4 years however has not used his device in over 4 years   PMH: Past Medical History:  Diagnosis Date   Accelerated junctional rhythm    Anemia    Anxiety    a.) Tx'd with BZO PRN   Arthritis    Bilateral carotid artery disease ([name])    BPH (benign prostatic hypertrophy)    CAD S/P percutaneous coronary angioplasty    a.) PCI in 2002 placing a RCA stent (unknown type). b.) LHC 12/07/2015: 5% ISR m-dRCA, 90% dRCA, 25% pRCA, 25% p-mLCx, 100% OM3, 95% oOM2-OM2, 75% m-dLAD; refer to CVTS. c.) 3v CABG 01/03/2016   CHF (congestive heart failure) ([place])    a.) TTE 05/12/2012: EF 50-55%; mild LA enlargement; G1DD. b.)  TTE 11/26/2015: EF 45%; mild BAE; triv PR, mild MR/TR; inferolateral HK; G1DD. c.)  TTE 11/07/2017: EF 35%; RV enlargement; BAE; inferior and inferolateral HK; triv PR; mild MR/TR. d.)  TTE 09/17/2019: EF 35%; mild LVH; triv MR/TR. e.)  TTE 08/11/2021: EF 50%; triv MR/TR; G1DD.   Cognitive deficit following cerebrovascular accident (CVA)    COPD (chronic obstructive pulmonary disease) ([name])    Cortical cataract    Depression    Dyspnea    GERD (gastroesophageal reflux disease)    History of 2019 novel coronavirus disease (COVID-19) 01/19/2021   History of acute inferior wall MI 2002   a.) PCI was performed placing a RCA stent (unknown type)   History of kidney stones    HTN (hypertension)    Hyperlipidemia    Hypothyroidism    Long term current  use of antithrombotics/antiplatelets    a.) DAPT therapy (ASA + clopidogrel)   OSA (obstructive sleep apnea)    a.) does not require nocturnal PAP therapy   PLMD (periodic limb movement disorder)    Postoperative atrial fibrillation ([facility]) 01/03/2016   a.) following CABG procedure   Prostatitis    PVD (peripheral vascular disease) ([place])    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 b/) residual RIGHT sided weakness   T2DM (type 2 diabetes mellitus) ([place])    Tobacco abuse     Surgical History: Past Surgical History:  Procedure Laterality Date   CARDIAC CATHETERIZATION N/A 12/07/2015   Procedure: Left Heart Cath and Coronary Angiography;  Surgeon: Corey Skains, MD;  Location: [facility];  Service: Cardiovascular;  Laterality: N/A;   CATARACT EXTRACTION W/PHACO Left 03/24/2021   Procedure: CATARACT EXTRACTION PHACO AND INTRAOCULAR LENS PLACEMENT (IOC) LEFT DIABETIC 6.67 00:42.5;  Surgeon: Birder Robson, MD;  Location: [facility];  Service: Ophthalmology;  Laterality: Left;   COLONOSCOPY WITH PROPOFOL N/A 02/18/2019   Procedure: COLONOSCOPY WITH PROPOFOL;  Surgeon: Lucilla Lame, MD;  Location: [facility];  Service: Endoscopy;  Laterality: N/A;  COLONOSCOPY WITH PROPOFOL N/A 12/22/2021   Procedure: COLONOSCOPY WITH PROPOFOL;  Surgeon: Lucilla Lame, MD;  Location: [facility];  Service: Endoscopy;  Laterality: N/A;   CORONARY ANGIOPLASTY WITH STENT PLACEMENT Left 2002   CORONARY ARTERY BYPASS GRAFT N/A 01/03/2016   Procedure: 3v CORONARY ARTERY BYPASS GRAFT (LIMA-LAD, SVG-PDA, SVG-OM2); Location: [facility]; Surgeon: Dwaine Deter, MD   CYSTOSCOPY W/ URETERAL STENT REMOVAL N/A 10/04/2021   Procedure: CYSTOSCOPY WITH PROSTATE FOREIGN BODY  REMOVAL;  Surgeon: Abbie Sons, MD;  Location: [facility];  Service: Urology;  Laterality: N/A;   CYSTOSCOPY WITH INSERTION OF UROLIFT N/A 04/29/2019   Procedure: CYSTOSCOPY WITH INSERTION OF UROLIFT;  Surgeon: Abbie Sons, MD;  Location: [facility];  Service: Urology;  Laterality: N/A;   ESOPHAGOGASTRODUODENOSCOPY (EGD) WITH PROPOFOL N/A 09/11/2016   Procedure: ESOPHAGOGASTRODUODENOSCOPY (EGD) WITH PROPOFOL;  Surgeon: Lucilla Lame, MD;  Location: [facility];  Service: Endoscopy;  Laterality: N/A;   ESOPHAGOGASTRODUODENOSCOPY (EGD) WITH PROPOFOL N/A 10/17/2016   Procedure: ESOPHAGOGASTRODUODENOSCOPY (EGD) WITH PROPOFOL;  Surgeon: Lucilla Lame, MD;  Location: [facility];  Service: Endoscopy;  Laterality: N/A;   ESOPHAGOGASTRODUODENOSCOPY (EGD) WITH PROPOFOL N/A 11/13/2017   Procedure: ESOPHAGOGASTRODUODENOSCOPY (EGD) WITH PROPOFOL;  Surgeon: Lucilla Lame, MD;  Location: [facility];  Service: Endoscopy;  Laterality: N/A;   pins R lower leg Left    rotator cuff replaced Right    SAVORY DILATION N/A 10/17/2016   Procedure: SAVORY DILATION;  Surgeon: Lucilla Lame, MD;  Location: [facility] ENDOS[facility];  Service: Endoscopy;  Laterality: N/A;   TRANSURETHRAL INCISION OF PROSTATE N/A 10/04/2021   Procedure: TRANSURETHRAL INCISION OF THE PROSTATE (TUIP);  Surgeon: Abbie Sons, MD;  Location: [facility];  Service: Urology;  Laterality: N/A;    Home Medications:  Allergies as of 02/10/2022   No Known Allergies      Medication List        Accurate as of February 10, 2022 11:38 AM. If you have any questions, ask your nurse or doctor.          aspirin EC 81 MG tablet Take 81 mg by mouth daily.   atorvastatin 80 MG tablet Commonly known as: LIPITOR Take 80 mg by mouth daily.   buPROPion 150 MG 24 hr tablet Commonly known as: WELLBUTRIN XL Take 150 mg by mouth at bedtime.   cholecalciferol 25 MCG (1000 UNIT) tablet Commonly known as: VITAMIN D3 Take 1,000 Units by mouth every  evening.   ciprofloxacin 500 MG tablet Commonly known as: CIPRO Take 1 tablet (500 mg total) by mouth every 12 (twelve) hours.   clonazePAM 0.5 MG tablet Commonly known as: KLONOPIN Take 0.5 mg by mouth 2 (two) times daily.   clopidogrel 75 MG tablet Commonly known as: PLAVIX Take 75 mg by mouth daily.   CoQ10 100 MG Caps Take 100 mg by mouth at bedtime.   ezetimibe 10 MG tablet Commonly known as: ZETIA Take 10 mg by mouth daily.   glucose blood test strip   Iron 325 (65 Fe) MG Tabs Take 325 mg by mouth daily.   levothyroxine 100 MCG tablet Commonly known as: SYNTHROID Take 100 mcg by mouth daily before breakfast.   metFORMIN 500 MG tablet Commonly known as: GLUCOPHAGE Take 500 mg by mouth 2 (two) times daily.   metoprolol tartrate 25 MG tablet Commonly known as: LOPRESSOR Take 25 mg by mouth 2 (two) times daily.   multivitamin with minerals Tabs tablet Take 1 tablet by mouth daily.   naproxen sodium 220 MG tablet Commonly  known as: ALEVE Take 220-440 mg by mouth 2 (two) times daily as needed (pain.).   pantoprazole 40 MG tablet Commonly known as: PROTONIX Take 1 tablet (40 mg total) by mouth at bedtime.   psyllium 28 % packet Commonly known as: METAMUCIL SMOOTH TEXTURE Take 1 packet by mouth daily.   RA Krill Oil 500 MG Caps Take 500 mg by mouth at bedtime.   spironolactone 25 MG tablet Commonly known as: ALDACTONE Take 25 mg by mouth daily.   tamsulosin 0.4 MG Caps capsule Commonly known as: FLOMAX Take 1 capsule (0.4 mg total) by mouth daily. What changed: when to take this   telmisartan 40 MG tablet Commonly known as: MICARDIS Take 20 mg by mouth at bedtime.   venlafaxine XR 150 MG 24 hr capsule Commonly known as: EFFEXOR-XR Take 150 mg by mouth daily.        Allergies: No Known Allergies  Family History: Family History  Problem Relation Age of Onset   Heart failure Father    Throat cancer Mother    Diabetes Brother     Social  History:  reports that he quit smoking about 9 years ago. His smoking use included cigarettes. He smoked an average of 2 packs per day. He has never used smokeless tobacco. He reports that he does not currently use alcohol. He reports that he does not use drugs.   Physical Exam: Constitutional:  Alert and oriented, No acute distress. HEENT: [place] AT, moist mucus membranes.  Trachea midline, no masses. Cardiovascular: No clubbing, cyanosis, or edema. Respiratory: Normal respiratory effort, no increased work of breathing. Psychiatric: Normal mood and affect.    Assessment & Plan:    1. Benign prostatic hyperplasia with incomplete bladder emptying PVR today slightly higher at 188 6 month follow-up with repeat PVR Instructed to call earlier for worsening voiding symptoms  2. Nocturia We discussed sleep apnea is a common cause of nocturia and use of CPAP has a good chance of significantly decreasing this problem I did offer him a trial of Gemtesa to see if this significantly change his voiding pattern which he desired.  He was given 4 weeks of samples and will call back regarding efficacy.  If no improvement would recommend he follow-up with his CPAP prescriber   Abbie Sons, [place] [address] [place], [place] [address] [phone]

## 2022-02-13 ENCOUNTER — Encounter: Payer: Self-pay | Admitting: Urology

## 2022-02-14 ENCOUNTER — Ambulatory Visit (INDEPENDENT_AMBULATORY_CARE_PROVIDER_SITE_OTHER): Payer: Medicare HMO | Admitting: Vascular Surgery

## 2022-02-14 ENCOUNTER — Encounter (INDEPENDENT_AMBULATORY_CARE_PROVIDER_SITE_OTHER): Payer: Self-pay | Admitting: Vascular Surgery

## 2022-02-14 VITALS — BP 144/83 | HR 60 | Resp 17 | Ht 71.0 in | Wt 214.0 lb

## 2022-02-14 DIAGNOSIS — I6523 Occlusion and stenosis of bilateral carotid arteries: Secondary | ICD-10-CM

## 2022-02-14 DIAGNOSIS — E785 Hyperlipidemia, unspecified: Secondary | ICD-10-CM | POA: Diagnosis not present

## 2022-02-14 DIAGNOSIS — I10 Essential (primary) hypertension: Secondary | ICD-10-CM | POA: Diagnosis not present

## 2022-02-14 DIAGNOSIS — E119 Type 2 diabetes mellitus without complications: Secondary | ICD-10-CM

## 2022-02-14 NOTE — Assessment & Plan Note (Signed)
lipid control important in reducing the progression of atherosclerotic disease. Continue statin therapy  

## 2022-02-14 NOTE — Progress Notes (Signed)
Patient ID: Paul Bass, male   DOB: 06/28/1946, 76 y.o.   MRN: [id number]  Chief Complaint  Patient presents with   Establish Care     Referred by Dr Nehemiah Massed    HPI Paul Bass is a 76 y.o. male.  I am asked to see the patient by Dr. Nehemiah Massed for evaluation of carotid stenosis.  The patient reports having a stroke and having a carotid stent placed back in 2013.  It sounds like he had a subsequent carotid angiogram a year or so later.  The patient has done well recently.  He does still have some residual sequela from his stroke with speech and discoordination. Carotid duplex performed by his cardiologist about 2 months ago demonstrated 50 to 69% right ICA stenosis and recurrent stenosis in his left ICA stent approaching if not exceeding 70%.  Given these findings, he is referred for further evaluation and treatment   Past Medical History:  Diagnosis Date   Accelerated junctional rhythm    Anemia    Anxiety    a.) Tx'd with BZO PRN   Arthritis    Bilateral carotid artery disease ([name])    BPH (benign prostatic hypertrophy)    CAD S/P percutaneous coronary angioplasty    a.) PCI in 2002 placing a RCA stent (unknown type). b.) LHC 12/07/2015: 5% ISR m-dRCA, 90% dRCA, 25% pRCA, 25% p-mLCx, 100% OM3, 95% oOM2-OM2, 75% m-dLAD; refer to CVTS. c.) 3v CABG 01/03/2016   CHF (congestive heart failure) ([place])    a.) TTE 05/12/2012: EF 50-55%; mild LA enlargement; G1DD. b.)  TTE 11/26/2015: EF 45%; mild BAE; triv PR, mild MR/TR; inferolateral HK; G1DD. c.)  TTE 11/07/2017: EF 35%; RV enlargement; BAE; inferior and inferolateral HK; triv PR; mild MR/TR. d.)  TTE 09/17/2019: EF 35%; mild LVH; triv MR/TR. e.)  TTE 08/11/2021: EF 50%; triv MR/TR; G1DD.   Cognitive deficit following cerebrovascular accident (CVA)    COPD (chronic obstructive pulmonary disease) ([name])    Cortical cataract    Depression    Dyspnea    GERD (gastroesophageal reflux disease)    History of 2019 novel coronavirus  disease (COVID-19) 01/19/2021   History of acute inferior wall MI 2002   a.) PCI was performed placing a RCA stent (unknown type)   History of kidney stones    HTN (hypertension)    Hyperlipidemia    Hypothyroidism    Long term current use of antithrombotics/antiplatelets    a.) DAPT therapy (ASA + clopidogrel)   OSA (obstructive sleep apnea)    a.) does not require nocturnal PAP therapy   PLMD (periodic limb movement disorder)    Postoperative atrial fibrillation ([place]) 01/03/2016   a.) following CABG procedure   Prostatitis    PVD (peripheral vascular disease) ([place])    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 b/) residual RIGHT sided weakness   T2DM (type 2 diabetes mellitus) ([place])    Tobacco abuse     Past Surgical History:  Procedure Laterality Date   CARDIAC CATHETERIZATION N/A 12/07/2015   Procedure: Left Heart Cath and Coronary Angiography;  Surgeon: Corey Skains, MD;  Location: [place] [facility];  Service: Cardiovascular;  Laterality: N/A;   CATARACT EXTRACTION W/PHACO Left 03/24/2021  Procedure: CATARACT EXTRACTION PHACO AND INTRAOCULAR LENS PLACEMENT (IOC) LEFT DIABETIC 6.67 00:42.5;  Surgeon: Birder Robson, MD;  Location: [facility];  Service: Ophthalmology;  Laterality: Left;   COLONOSCOPY WITH PROPOFOL N/A 02/18/2019   Procedure: COLONOSCOPY WITH PROPOFOL;  Surgeon: Lucilla Lame, MD;  Location: [facility];  Service: Endoscopy;  Laterality: N/A;   COLONOSCOPY WITH PROPOFOL N/A 12/22/2021   Procedure: COLONOSCOPY WITH PROPOFOL;  Surgeon: Lucilla Lame, MD;  Location: [facility];  Service: Endoscopy;  Laterality: N/A;   CORONARY ANGIOPLASTY WITH STENT PLACEMENT Left 2002   CORONARY ARTERY BYPASS GRAFT N/A 01/03/2016    Procedure: 3v CORONARY ARTERY BYPASS GRAFT (LIMA-LAD, SVG-PDA, SVG-OM2); Location: [facility]; Surgeon: Dwaine Deter, MD   CYSTOSCOPY W/ URETERAL STENT REMOVAL N/A 10/04/2021   Procedure: CYSTOSCOPY WITH PROSTATE FOREIGN BODY REMOVAL;  Surgeon: Abbie Sons, MD;  Location: [facility];  Service: Urology;  Laterality: N/A;   CYSTOSCOPY WITH INSERTION OF UROLIFT N/A 04/29/2019   Procedure: CYSTOSCOPY WITH INSERTION OF UROLIFT;  Surgeon: Abbie Sons, MD;  Location: [facility];  Service: Urology;  Laterality: N/A;   ESOPHAGOGASTRODUODENOSCOPY (EGD) WITH PROPOFOL N/A 09/11/2016   Procedure: ESOPHAGOGASTRODUODENOSCOPY (EGD) WITH PROPOFOL;  Surgeon: Lucilla Lame, MD;  Location: [facility];  Service: Endoscopy;  Laterality: N/A;   ESOPHAGOGASTRODUODENOSCOPY (EGD) WITH PROPOFOL N/A 10/17/2016   Procedure: ESOPHAGOGASTRODUODENOSCOPY (EGD) WITH PROPOFOL;  Surgeon: Lucilla Lame, MD;  Location: [facility];  Service: Endoscopy;  Laterality: N/A;   ESOPHAGOGASTRODUODENOSCOPY (EGD) WITH PROPOFOL N/A 11/13/2017   Procedure: ESOPHAGOGASTRODUODENOSCOPY (EGD) WITH PROPOFOL;  Surgeon: Lucilla Lame, MD;  Location: [facility];  Service: Endoscopy;  Laterality: N/A;   pins R lower leg Left    rotator cuff replaced Right    SAVORY DILATION N/A 10/17/2016   Procedure: SAVORY DILATION;  Surgeon: Lucilla Lame, MD;  Location: [facility];  Service: Endoscopy;  Laterality: N/A;   TRANSURETHRAL INCISION OF PROSTATE N/A 10/04/2021   Procedure: TRANSURETHRAL INCISION OF THE PROSTATE (TUIP);  Surgeon: Abbie Sons, MD;  Location: [facility];  Service: Urology;  Laterality: N/A;    Family History  Problem Relation Age of Onset   Heart failure Father    Throat cancer Mother    Diabetes Brother   No bleeding or clotting disorders   Social History   Tobacco Use   Smoking status: Former    Packs/day: 2.00    Types: Cigarettes    Quit date: 05/20/2012    Years since quitting: 9.7   Smokeless tobacco: Never   Vaping Use   Vaping Use: Never used  Substance Use Topics   Alcohol use: Not Currently    Comment: weekend drinker, had 5 beers on 05/11/12, prior to stroke, No alcohol since stroke   Drug use: Never     No Known Allergies  Current Outpatient Medications  Medication Sig Dispense Refill   albuterol (VENTOLIN HFA) 108 (90 Base) MCG/ACT inhaler SMARTSIG:2 inhalation Via Inhaler Every 4 Hours PRN     atorvastatin (LIPITOR) 80 MG tablet Take 80 mg by mouth daily.      buPROPion (WELLBUTRIN XL) 150 MG 24 hr tablet Take 150 mg by mouth at bedtime.     cholecalciferol (VITAMIN D3) 25 MCG (1000 UT) tablet Take 1,000 Units by mouth every evening.     clonazePAM (KLONOPIN) 0.5 MG tablet Take 0.5 mg by mouth 2 (two) times daily.     clopidogrel (PLAVIX) 75 MG tablet Take 75 mg by mouth daily.   1   Coenzyme Q10 (COQ10) 100 MG CAPS  Take 100 mg by mouth at bedtime.     Ferrous Sulfate (IRON) 325 (65 Fe) MG TABS Take 325 mg by mouth daily.     fluticasone-salmeterol (ADVAIR) 500-50 MCG/ACT AEPB Inhale into the lungs.     glucose blood test strip      levothyroxine (SYNTHROID, LEVOTHROID) 100 MCG tablet Take 100 mcg by mouth daily before breakfast.     metFORMIN (GLUCOPHAGE) 500 MG tablet Take 500 mg by mouth 2 (two) times daily.      metoprolol tartrate (LOPRESSOR) 25 MG tablet Take 25 mg by mouth 2 (two) times daily.     Multiple Vitamin (MULTIVITAMIN WITH MINERALS) TABS Take 1 tablet by mouth daily.     naproxen sodium (ALEVE) 220 MG tablet Take 220-440 mg by mouth 2 (two) times daily as needed (pain.).     pantoprazole (PROTONIX) 40 MG tablet Take 1 tablet (40 mg total) by mouth at bedtime. 90 tablet 3   psyllium (METAMUCIL SMOOTH TEXTURE) 28 % packet Take 1 packet by mouth daily.     RA KRILL OIL 500 MG CAPS Take 500 mg by mouth at bedtime.      venlafaxine XR (EFFEXOR-XR) 150 MG 24 hr capsule Take 150 mg by mouth daily.     No current facility-administered medications for this visit.       REVIEW OF SYSTEMS (Negative unless checked)  Constitutional: '[]'$ Weight loss  '[]'$ Fever  '[]'$ Chills Cardiac: '[]'$ Chest pain   '[]'$ Chest pressure   '[x]'$ Palpitations   '[]'$ Shortness of breath when laying flat   '[]'$ Shortness of breath at rest   '[x]'$ Shortness of breath with exertion. Vascular:  '[]'$ Pain in legs with walking   '[]'$ Pain in legs at rest   '[]'$ Pain in legs when laying flat   '[]'$ Claudication   '[]'$ Pain in feet when walking  '[]'$ Pain in feet at rest  '[]'$ Pain in feet when laying flat   '[]'$ History of DVT   '[]'$ Phlebitis   '[]'$ Swelling in legs   '[]'$ Varicose veins   '[]'$ Non-healing ulcers Pulmonary:   '[]'$ Uses home oxygen   '[]'$ Productive cough   '[]'$ Hemoptysis   '[]'$ Wheeze  '[]'$ COPD   '[]'$ Asthma Neurologic:  '[]'$ Dizziness  '[]'$ Blackouts   '[]'$ Seizures   '[x]'$ History of stroke   '[]'$ History of TIA  '[]'$ Aphasia   '[]'$ Temporary blindness   '[]'$ Dysphagia   '[]'$ Weakness or numbness in arms   '[]'$ Weakness or numbness in legs Musculoskeletal:  '[x]'$ Arthritis   '[]'$ Joint swelling   '[]'$ Joint pain   '[]'$ Low back pain Hematologic:  '[]'$ Easy bruising  '[]'$ Easy bleeding   '[]'$ Hypercoagulable state   '[x]'$ Anemic  '[]'$ Hepatitis Gastrointestinal:  '[]'$ Blood in stool   '[]'$ Vomiting blood  '[x]'$ Gastroesophageal reflux/heartburn   '[]'$ Abdominal pain Genitourinary:  '[]'$ Chronic kidney disease   '[]'$ Difficult urination  '[x]'$ Frequent urination  '[]'$ Burning with urination   '[]'$ Hematuria Skin:  '[]'$ Rashes   '[]'$ Ulcers   '[]'$ Wounds Psychological:  '[]'$ History of anxiety   '[]'$  History of major depression.    Physical Exam BP (!) 144/83 (BP Location: Left Arm)   Pulse 60   Resp 17   Ht '5\' 11"'$  (1.803 m)   Wt 214 lb (97.1 kg)   BMI 29.85 kg/m  Gen:  WD/WN, NAD Head: [place]/AT, No temporalis wasting. Ear/Nose/Throat: Hearing grossly intact, nares w/o erythema or drainage, oropharynx w/o Erythema/Exudate Eyes: Conjunctiva clear, sclera non-icteric  Neck: trachea midline.  Bilateral carotid bruit present Pulmonary:  Good air movement, clear to auscultation bilaterally.  Cardiac: RRR, no JVD Vascular:  Vessel  Right Left  Radial Palpable Palpable  Musculoskeletal: Seems to have some right-sided discoordination and mild weakness.  Extremities without ischemic changes.  No deformity or atrophy. no edema. Neurologic: Sensation grossly intact in extremities.  Symmetrical.  Speech a bit staggered and not fluid. Motor exam as listed above. Psychiatric: Judgment intact, Mood & affect appropriate for pt's clinical situation. Dermatologic: No rashes or ulcers noted.  No cellulitis or open wounds. Lymph : No Cervical, Axillary, or Inguinal lymphadenopathy.   Radiology No results found.  Labs Recent Results (from the past 2160 hour(s))  Glucose, capillary     Status: Abnormal   Collection Time: 12/22/21  9:12 AM  Result Value Ref Range   Glucose-Capillary 198 (H) 70 - 99 mg/dL    Comment: Glucose reference range applies only to samples taken after fasting for at least 8 hours.  Surgical pathology     Status: None   Collection Time: 12/22/21  9:51 AM  Result Value Ref Range   SURGICAL PATHOLOGY      SURGICAL PATHOLOGY CASE: [id number] [phone] PATIENT: [facility] Surgical Pathology Report     Specimen Submitted: A. Colon polyps x2, cecum; cbx B. Colon polyps x2, ascending; cbx (1) c. snare (1) C. Colon polyps x3, transverse; cold snare  Clinical History: History of colon polyps. Polyps and diverticulosis    DIAGNOSIS: A. COLON POLYPS X2, CECUM; COLD BIOPSY: - FRAGMENTS (X2) OF TUBULAR ADENOMAS. - NEGATIVE FOR HIGH-GRADE DYSPLASIA AND MALIGNANCY.  B. COLON POLYPS X2, ASCENDING; COLD BIOPSY AND COLD SNARE: - SINGLE FRAGMENT OF TUBULAR ADENOMA. - MULTIPLE FRAGMENTS OF SESSILE SERRATED POLYP. - NEGATIVE FOR HIGH-GRADE DYSPLASIA AND MALIGNANCY.  C. COLON POLYPS X3, TRANSVERSE; COLD SNARE: - MULTIPLE FRAGMENTS OF TUBULAR ADENOMAS. - NEGATIVE FOR HIGH-GRADE DYSPLASIA AND MALIGNANCY.  GROSS DESCRIPTION: A. Labeled: cbx cecal polyps x2 Received:  Formalin Collection time: 9:51 AM on 12/22/2021 Placed into formalin time: 9:51 AM on 12/22/2021 Tissue fragment(s):  2 Size: Range from 0.5-0.7 cm Description: Tan elongated soft tissue fragments Entirely submitted in 1 cassette.  B. Labeled: cbx x1/cold snare x1 ascending colon polyp Received: Formalin Collection time: 9:54 AM on 12/22/2021 Placed into formalin time: 9:54 AM on 12/22/2021 Tissue fragment(s): 2 Size: Range from 0.5-2.5 cm Description: Received are fragments of tan soft tissue.  The larger fragment has a resection margin which is inked blue.  This fragment is serially sectioned. Entirely submitted in cassettes 1-3 with the serially sectioned fragment in cassettes 1-2 and the remaining fragment in cassette 3  C. Labeled: Cold snare transverse colon polyps x3 Received: Formalin Collection time: 9:57 AM on 12/22/2021 Placed into formalin time: 9:57 AM on 12/22/2021 Tissue fragment(s): Multiple Size: Aggregate, 1.4 x 0.7 x 0.3 cm Description: Tan elongated soft tissue fragments Entirely submitted in 1 cassette.  RB 12/22/2021  Final Diagnosis performed by Sherlie Ban, MD.   Electronically signed 12/23/2021 8:21:25AM The electronic signature indicates that the named Attending Pathologist has evaluated the specimen Technical component performed at [facility], [address] [place], [place] [address] Lab: [phone] Dir: Rush Farmer, MD, MMM  Professional component performed at [facility], [facility], [place], [place], [place] [address] Lab: [phone] Dir: Kathi Simpers, MD   Bladder Scan (Post Void Residual) in office     Status: None   Collection Time: 02/10/22 11:31 AM  Result Value Ref Range   Scan Result 188     Assessment/Plan:  Symptomatic carotid artery stenosis Carotid duplex performed by his cardiologist about 2 months ago demonstrated 50 to 69% right ICA stenosis  and recurrent stenosis in his left ICA stent approaching if  not exceeding 70%. He is on appropriate medical therapy with Plavix and statin agent We had a discussion about the pathophysiology and natural history of carotid disease today. I have offered him 3 options for further evaluation and treatment of his carotid disease.  He has borderline recurrent stenosis requiring intervention by his previous duplex criteria. First, we can do a short interval follow-up in the next month or 2 with carotid duplex to assess whether or not there is continuing progression.  Second, we could perform a CT angiogram although the stent artifact may limit the evaluation somewhat.  Third, we can perform an angiogram with intention to treat if we do in fact see a greater than 70% stenosis of the left carotid artery.  All 3 options are reasonable and we discussed the pros and cons of each scenario.  He would for a short interval follow-up with a duplex which is reasonable so I will see him back next month with a duplex.   HTN (hypertension) blood pressure control important in reducing the progression of atherosclerotic disease. On appropriate oral medications.   Type 2 diabetes mellitus without complications ([name]) blood glucose control important in reducing the progression of atherosclerotic disease. Also, involved in wound healing. On appropriate medications.   HLD (hyperlipidemia) lipid control important in reducing the progression of atherosclerotic disease. Continue statin therapy      Leotis Pain 02/14/2022, 12:19 PM   This note was created with Dragon medical transcription system.  Any errors from dictation are unintentional.

## 2022-02-14 NOTE — Patient Instructions (Signed)
Carotid Artery Disease  Carotid artery disease is the narrowing or blockage of one or both carotid arteries. This condition is also called carotid artery stenosis. The carotid arteries are the two main blood vessels on either side of the neck. They send blood to the brain, other parts of the head, and the neck.  This condition increases your risk for a stroke or a transient ischemic attack (TIA). A TIA is a "mini-stroke" that causes stroke-like symptoms that go away quickly. What are the causes? This condition is mainly caused by a narrowing and hardening of the carotid arteries. The carotid arteries can become narrow or clogged with a buildup of plaque. Plaque includes: Fat. Cholesterol. Calcium. Other substances. What increases the risk? The following factors may make you more likely to develop this condition: Having certain medical conditions, such as: High cholesterol. High blood pressure. Diabetes. Obesity. Smoking. A family history of cardiovascular disease. Not being active or lack of regular exercise. Being male. Men have a higher risk of having arteries become narrow and harden earlier in life than women. Old age. What are the signs or symptoms? This condition may not have any signs or symptoms until a stroke or TIA happens. In some cases, your doctor may be able to hear a whooshing sound. This can suggest a change in blood flow caused by plaque buildup. An eye exam can also help find signs of the condition. How is this treated? This condition may be treated with more than one treatment. Treatment options include: Lifestyle changes, such as: Quitting smoking. Getting regular exercise, or getting exercise as told by your doctor. Eating a healthy diet. Managing stress. Keeping a healthy weight. Medicines to control: Blood pressure. Cholesterol. Blood clotting. Surgery. You may have: A surgery to remove the blockages in the carotid arteries. A procedure in which a small  mesh tube (stent) is used to widen the blocked carotid arteries. Follow these instructions at home: Eating and drinking Follow instructions about your diet from your doctor. It is important to follow a healthy diet. Eat a diet that includes: A lot of fresh fruits and vegetables. Low-fat (lean) meats. Avoid these foods: Foods that are high in fat. Foods that are high in salt (sodium). Foods that are fried. Foods that are processed. Foods that have few good nutrients (poor nutritional value).  Lifestyle  Keep a healthy weight. Do exercises as told by your doctor to stay active. Each week, you should get one of the following: At least 150 minutes of exercise that raises your heart rate and makes you sweat (moderate-intensity exercise). At least 75 minutes of exercise that takes a lot of effort. Do not use any products that contain nicotine or tobacco, such as cigarettes, e-cigarettes, and chewing tobacco. If you need help quitting, ask your doctor. Do not drink alcohol if: Your doctor tells you not to drink. You are pregnant, may be pregnant, or are planning to become pregnant. If you drink alcohol: Limit how much you use to: 0-1 drink a day for women. 0-2 drinks a day for men. Be aware of how much alcohol is in your drink. In the [place] one drink equals one 12 oz bottle of beer (355 mL), one 5 oz glass of wine (148 mL), or one 1 oz glass of hard liquor (44 mL). Do not use drugs. Manage your stress. Ask your doctor for tips on how to do this. General instructions Take over-the-counter and prescription medicines only as told by your doctor. Keep all follow-up   visits as told by your doctor. This is important. Where to find more information American Heart Association: www.heart.org Get help right away if: You have any signs of a stroke. "BE FAST" is an easy way to remember the main warning signs: B - Balance. Signs are dizziness, sudden trouble walking, or loss of balance. E - Eyes.  Signs are trouble seeing or a change in how you see. F - Face. Signs are sudden weakness or loss of feeling of the face, or the face or eyelid drooping on one side. A - Arms. Signs are weakness or loss of feeling in an arm. This happens suddenly and usually on one side of the body. S - Speech. Signs are sudden trouble speaking, slurred speech, or trouble understanding what people say. T - Time. Time to call emergency services. Write down what time symptoms started. You have other signs of a stroke, such as: A sudden, very bad headache with no known cause. Feeling like you may vomit (nausea). Vomiting. A seizure. These symptoms may be an emergency. Do not wait to see if the symptoms will go away. Get medical help right away. Call your local emergency services (911 in the [place] Do not drive yourself to the hospital. Summary The carotid arteries are blood vessels on both sides of the neck. If these arteries get smaller or get blocked, you are more likely to have a stroke or a mini-stroke. This condition can be treated with lifestyle changes, medicines, surgery, or a blend of these treatments. Get help right away if you have any signs of a stroke. "BE FAST" is an easy way to remember the main warning signs of stroke. This information is not intended to replace advice given to you by your health care provider. Make sure you discuss any questions you have with your health care provider. Document Revised: 02/10/2019 Document Reviewed: 02/10/2019 Elsevier Patient Education  [place].

## 2022-02-14 NOTE — Assessment & Plan Note (Signed)
Carotid duplex performed by his cardiologist about 2 months ago demonstrated 50 to 69% right ICA stenosis and recurrent stenosis in his left ICA stent approaching if not exceeding 70%. He is on appropriate medical therapy with Plavix and statin agent We had a discussion about the pathophysiology and natural history of carotid disease today. I have offered him 3 options for further evaluation and treatment of his carotid disease.  He has borderline recurrent stenosis requiring intervention by his previous duplex criteria. First, we can do a short interval follow-up in the next month or 2 with carotid duplex to assess whether or not there is continuing progression.  Second, we could perform a CT angiogram although the stent artifact may limit the evaluation somewhat.  Third, we can perform an angiogram with intention to treat if we do in fact see a greater than 70% stenosis of the left carotid artery.  All 3 options are reasonable and we discussed the pros and cons of each scenario.  He would for a short interval follow-up with a duplex which is reasonable so I will see him back next month with a duplex.

## 2022-02-14 NOTE — Assessment & Plan Note (Signed)
blood glucose control important in reducing the progression of atherosclerotic disease. Also, involved in wound healing. On appropriate medications.  

## 2022-02-14 NOTE — Assessment & Plan Note (Signed)
blood pressure control important in reducing the progression of atherosclerotic disease. On appropriate oral medications.  

## 2022-02-28 ENCOUNTER — Encounter: Payer: Self-pay | Admitting: Urology

## 2022-02-28 DIAGNOSIS — F418 Other specified anxiety disorders: Secondary | ICD-10-CM | POA: Diagnosis not present

## 2022-02-28 DIAGNOSIS — E1159 Type 2 diabetes mellitus with other circulatory complications: Secondary | ICD-10-CM | POA: Diagnosis not present

## 2022-02-28 DIAGNOSIS — I10 Essential (primary) hypertension: Secondary | ICD-10-CM | POA: Diagnosis not present

## 2022-02-28 DIAGNOSIS — Z1389 Encounter for screening for other disorder: Secondary | ICD-10-CM | POA: Diagnosis not present

## 2022-02-28 DIAGNOSIS — Z0000 Encounter for general adult medical examination without abnormal findings: Secondary | ICD-10-CM | POA: Diagnosis not present

## 2022-02-28 DIAGNOSIS — Z79899 Other long term (current) drug therapy: Secondary | ICD-10-CM | POA: Diagnosis not present

## 2022-02-28 DIAGNOSIS — E1151 Type 2 diabetes mellitus with diabetic peripheral angiopathy without gangrene: Secondary | ICD-10-CM | POA: Diagnosis not present

## 2022-02-28 DIAGNOSIS — G4761 Periodic limb movement disorder: Secondary | ICD-10-CM | POA: Diagnosis not present

## 2022-02-28 DIAGNOSIS — E039 Hypothyroidism, unspecified: Secondary | ICD-10-CM | POA: Diagnosis not present

## 2022-02-28 DIAGNOSIS — N401 Enlarged prostate with lower urinary tract symptoms: Secondary | ICD-10-CM | POA: Diagnosis not present

## 2022-02-28 DIAGNOSIS — E782 Mixed hyperlipidemia: Secondary | ICD-10-CM | POA: Diagnosis not present

## 2022-02-28 DIAGNOSIS — I69319 Unspecified symptoms and signs involving cognitive functions following cerebral infarction: Secondary | ICD-10-CM | POA: Diagnosis not present

## 2022-03-01 ENCOUNTER — Other Ambulatory Visit: Payer: Self-pay | Admitting: *Deleted

## 2022-03-01 MED ORDER — TAMSULOSIN HCL 0.4 MG PO CAPS: 0.4000 mg | ORAL_CAPSULE | Freq: Every day | ORAL | 0 refills | Status: DC

## 2022-03-17 DIAGNOSIS — I69319 Unspecified symptoms and signs involving cognitive functions following cerebral infarction: Secondary | ICD-10-CM | POA: Diagnosis not present

## 2022-03-17 DIAGNOSIS — I6523 Occlusion and stenosis of bilateral carotid arteries: Secondary | ICD-10-CM | POA: Diagnosis not present

## 2022-03-17 DIAGNOSIS — R410 Disorientation, unspecified: Secondary | ICD-10-CM | POA: Diagnosis not present

## 2022-03-17 DIAGNOSIS — R29898 Other symptoms and signs involving the musculoskeletal system: Secondary | ICD-10-CM | POA: Diagnosis not present

## 2022-03-17 DIAGNOSIS — G2581 Restless legs syndrome: Secondary | ICD-10-CM | POA: Diagnosis not present

## 2022-03-20 ENCOUNTER — Ambulatory Visit (INDEPENDENT_AMBULATORY_CARE_PROVIDER_SITE_OTHER): Payer: Medicare HMO | Admitting: Nurse Practitioner

## 2022-03-20 ENCOUNTER — Encounter (INDEPENDENT_AMBULATORY_CARE_PROVIDER_SITE_OTHER): Payer: Self-pay | Admitting: Nurse Practitioner

## 2022-03-20 ENCOUNTER — Ambulatory Visit (INDEPENDENT_AMBULATORY_CARE_PROVIDER_SITE_OTHER): Payer: Medicare HMO

## 2022-03-20 VITALS — BP 118/73 | HR 67 | Resp 17 | Ht 71.0 in | Wt 210.4 lb

## 2022-03-20 DIAGNOSIS — I6523 Occlusion and stenosis of bilateral carotid arteries: Secondary | ICD-10-CM

## 2022-03-20 DIAGNOSIS — I10 Essential (primary) hypertension: Secondary | ICD-10-CM

## 2022-03-20 DIAGNOSIS — E119 Type 2 diabetes mellitus without complications: Secondary | ICD-10-CM

## 2022-03-24 ENCOUNTER — Other Ambulatory Visit: Payer: Self-pay | Admitting: Student

## 2022-03-24 ENCOUNTER — Other Ambulatory Visit: Payer: Self-pay | Admitting: Urology

## 2022-03-24 DIAGNOSIS — R29898 Other symptoms and signs involving the musculoskeletal system: Secondary | ICD-10-CM

## 2022-03-27 ENCOUNTER — Ambulatory Visit: Payer: Medicare HMO | Admitting: Speech Pathology

## 2022-03-30 ENCOUNTER — Ambulatory Visit: Payer: Medicare HMO | Attending: Neurology | Admitting: Speech Pathology

## 2022-03-30 DIAGNOSIS — I63512 Cerebral infarction due to unspecified occlusion or stenosis of left middle cerebral artery: Secondary | ICD-10-CM | POA: Diagnosis not present

## 2022-03-30 DIAGNOSIS — R4701 Aphasia: Secondary | ICD-10-CM | POA: Insufficient documentation

## 2022-03-30 DIAGNOSIS — R41841 Cognitive communication deficit: Secondary | ICD-10-CM | POA: Diagnosis not present

## 2022-03-30 NOTE — Therapy (Addendum)
OUTPATIENT SPEECH LANGUAGE PATHOLOGY APHASIA EVALUATION   Patient Name: Paul Bass MRN: [id number] DOB:1945-11-27, 76 y.o., male Today's Date: 03/30/2022  PCP: [name], NP REFERRING PROVIDER: Jennings Books, MD   End of Session - 03/30/22 1236     Visit Number 1    Number of Visits 17    Date for SLP Re-Evaluation 05/25/22    Authorization Type [employer] H[employer]    Progress Note Due on Visit 10    SLP Start Time 1000    SLP Stop Time  1100    SLP Time Calculation (min) 60 min    Activity Tolerance Patient tolerated treatment well             Past Medical History:  Diagnosis Date   Accelerated junctional rhythm    Anemia    Anxiety    a.) Tx'd with BZO PRN   Arthritis    Bilateral carotid artery disease ([place])    BPH (benign prostatic hypertrophy)    CAD S/P percutaneous coronary angioplasty    a.) PCI in 2002 placing a RCA stent (unknown type). b.) LHC 12/07/2015: 5% ISR m-dRCA, 90% dRCA, 25% pRCA, 25% p-mLCx, 100% OM3, 95% oOM2-OM2, 75% m-dLAD; refer to CVTS. c.) 3v CABG 01/03/2016   CHF (congestive heart failure) ([place])    a.) TTE 05/12/2012: EF 50-55%; mild LA enlargement; G1DD. b.)  TTE 11/26/2015: EF 45%; mild BAE; triv PR, mild MR/TR; inferolateral HK; G1DD. c.)  TTE 11/07/2017: EF 35%; RV enlargement; BAE; inferior and inferolateral HK; triv PR; mild MR/TR. d.)  TTE 09/17/2019: EF 35%; mild LVH; triv MR/TR. e.)  TTE 08/11/2021: EF 50%; triv MR/TR; G1DD.   Cognitive deficit following cerebrovascular accident (CVA)    COPD (chronic obstructive pulmonary disease) ([name])    Cortical cataract    Depression    Dyspnea    GERD (gastroesophageal reflux disease)    History of 2019 novel coronavirus disease (COVID-19) 01/19/2021   History of acute inferior wall MI 2002   a.) PCI was performed placing a RCA stent (unknown type)   History of kidney stones    HTN (hypertension)    Hyperlipidemia    Hypothyroidism    Long term current use of  antithrombotics/antiplatelets    a.) DAPT therapy (ASA + clopidogrel)   OSA (obstructive sleep apnea)    a.) does not require nocturnal PAP therapy   PLMD (periodic limb movement disorder)    Postoperative atrial fibrillation ([place]) 01/03/2016   a.) following CABG procedure   Prostatitis    PVD (peripheral vascular disease) ([place])    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 b/) residual RIGHT sided weakness   T2DM (type 2 diabetes mellitus) ([place])    Tobacco abuse    Past Surgical History:  Procedure Laterality Date   CARDIAC CATHETERIZATION N/A 12/07/2015   Procedure: Left Heart Cath and Coronary Angiography;  Surgeon: Corey Skains, MD;  Location: [facility];  Service: Cardiovascular;  Laterality: N/A;   CATARACT EXTRACTION W/PHACO Left 03/24/2021   Procedure: CATARACT EXTRACTION PHACO AND INTRAOCULAR LENS PLACEMENT (IOC) LEFT DIABETIC 6.67 00:42.5;  Surgeon: Birder Robson, MD;  Location: [facility];  Service: Ophthalmology;  Laterality: Left;   COLONOSCOPY WITH PROPOFOL N/A 02/18/2019   Procedure:  COLONOSCOPY WITH PROPOFOL;  Surgeon: Lucilla Lame, MD;  Location[facility] END[facility]COPY;  Service: Endoscopy;  Laterality: N/A;   COLONOSCOPY WITH PROPOFOL N/A 12/22/2021   Procedure: COLONOSCOPY WITH PROPOFOL;  Surgeon: Lucilla Lame, MD;  Location: [facility] ENDOSCOPY;  Service: Endoscopy;  Laterality: N/A;   CORONARY ANGIOPLASTY WITH STENT PLACEMENT Left 2002   CORONARY ARTERY BYPASS GRAFT N/A 01/03/2016   Procedure: 3v CORONARY ARTERY BYPASS GRAFT (LIMA-LAD, SVG-PDA, SVG-OM2); Location: [facility]; Surgeon: Dwaine Deter, MD   CYSTOSCOPY W/ URETERAL STENT REMOVAL N/A 10/04/2021   Procedure: CYSTOSCOPY WITH PROSTATE FOREIGN BODY REMOVAL;  Surgeon: Abbie Sons, MD;  Location: [facility];  Service: Urology;  Laterality: N/A;   CYSTOSCOPY WITH INSERTION OF UROLIFT N/A 04/29/2019   Procedure: CYSTOSCOPY WITH INSERTION OF UROLIFT;  Surgeon: Abbie Sons, MD;  Location: [facility];  Service: Urology;  Laterality: N/A;   ESOPHAGOGASTRODUODENOSCOPY (EGD) WITH PROPOFOL N/A 09/11/2016   Procedure: ESOPHAGOGASTRODUODENOSCOPY (EGD) WITH PROPOFOL;  Surgeon: Lucilla Lame, MD;  Location: [facility]OSCOPY;  Service: Endoscopy;  Laterality: N/A;   ESOPHAGOGASTRODUODENOSCOPY (EGD) WITH PROPOFOL N/A 10/17/2016   Procedure: ESOPHAGOGASTRODUODENOSCOPY (EGD) WITH PROPOFOL;  Surgeon: Lucilla Lame, MD;  Location: [facility];  Service: Endoscopy;  Laterality: N/A;   ESOPHAGOGASTRODUODENOSCOPY (EGD) WITH PROPOFOL N/A 11/13/2017   Procedure: ESOPHAGOGASTRODUODENOSCOPY (EGD) WITH PROPOFOL;  Surgeon: Lucilla Lame, MD;  Location: [facility] ENDOSCOPY;  Service: Endoscopy;  Laterality: N/A;   pins R lower leg Left    rotator cuff replaced Right    SAVORY DILATION N/A 10/17/2016   Procedure: SAVORY DILATION;  Surgeon: Lucilla Lame, MD;  Location: [facility] ENDOSCOPY;  Service: Endoscopy;  Laterality: N/A;   TRANSURETHRAL INCISION OF PROSTATE N/A 10/04/2021   Procedure: TRANSURETHRAL INCISION OF THE PROSTATE (TUIP);  Surgeon: Abbie Sons, MD;  Location: [facility];  Service: Urology;  Laterality: N/A;   Patient Active Problem List   Diagnosis Date Noted   History of colonic polyps    Anxiety state 11/17/2021   Cognitive deficit, post-stroke 11/17/2021   Ex-smoker 11/17/2021   Bradycardia 33/29/5188   Chronic systolic CHF (congestive heart failure), NYHA class 2 ([place]) 05/27/2020   Accelerated junctional rhythm 04/21/2019   Encounter for screening colonoscopy    Polyp of colon    Atherosclerotic heart disease of native coronary artery without angina pectoris 01/14/2019   Depression 01/14/2019   GERD (gastroesophageal reflux disease) 01/14/2019   Periodic limb movement disorder (PLMD)  01/14/2019   Sleep apnea 01/14/2019   Type 2 diabetes mellitus without complications ([place]) 41/66/0630   Stricture and stenosis of esophagus    Problems with swallowing and mastication    Food impaction of esophagus    H/O coronary artery bypass surgery 01/17/2016   Cerebrovascular accident (CVA) due to embolism ([place]) 01/03/2016   Hypothyroidism, unspecified 01/03/2016   HLD (hyperlipidemia) 01/03/2016   Stable angina ([place]) 11/29/2015   Benign essential hypertension 05/27/2015   Bilateral carotid artery stenosis 02/24/2015   Atherosclerotic peripheral vascular disease ([place]) 09/07/2014   Bladder calculus 08/12/2014   Primary osteoarthritis of left knee 03/10/2014   Elevated prostate specific antigen (PSA) 02/05/2013   Snoring 01/22/2013   Sleepiness 01/22/2013   Dysphagia, unspecified(787.20) 01/22/2013   Occlusion and stenosis of carotid artery without mention of cerebral infarction 01/22/2013   Cerebral thrombosis with cerebral infarction ([place]) 01/22/2013   Benign prostatic hyperplasia with incomplete bladder emptying 08/26/2012   Chronic prostatitis 08/26/2012   Family history of malignant neoplasm of prostate 08/26/2012   Incomplete emptying of bladder 08/26/2012   Dysphagia 05/14/2012  Global aphasia 05/14/2012   Stroke, acute, thrombotic ([place]) 05/12/2012   Symptomatic carotid artery stenosis 05/12/2012   HTN (hypertension) 05/12/2012   COPD (chronic obstructive pulmonary disease) ([place]) 05/12/2012   Acute respiratory failure ([place]) 05/12/2012    ONSET DATE: 05/13/2012   REFERRING DIAG: F16.384 (ICD-10-CM) - Cognitive deficit, post-stroke   THERAPY DIAG:  Aphasia  Cognitive communication deficit  Left middle cerebral artery stroke ([place])  Rationale for Evaluation and Treatment Rehabilitation  SUBJECTIVE:   SUBJECTIVE STATEMENT: Pt pleasant, "I can't think, I can't think about it. My wife says, you can think about it but you can't say it  Pt accompanied by:  self  PERTINENT HISTORY: Pt is a 76 year old male with past medical hx of anxiety, COP, depression, diabetes mellitus without complication, HTN, hyperlipidemia.   Of note, pt received Outpatient ST services for a total of 28 sessions ending on 03/24/2020. At the end of that POC pt required moderate assistance.    DIAGNOSTIC FINDINGS:  MRI 10/03/2019  No acute intracranial abnormality. 2. Moderate-sized chronic left MCA infarct. 3. Mild chronic small vessel ischemic disease, mildly progressed from 2014. 4. Progressive cerebral atrophy.  PAIN:  Are you having pain? No  FALLS: Has patient fallen in last 6 months?  No  LIVING ENVIRONMENT: Lives with: lives with their spouse Lives in: House/apartment  PLOF:  Level of assistance: Independent with ADLs, Independent with IADLs Employment: Retired   PATIENT GOALS none stated, he states that he is "here" because his doctor thought it would be a good idea  OBJECTIVE:   COGNITION: Overall cognitive status: No family/caregiver present to determine baseline cognitive functioning  AUDITORY COMPREHENSION: Overall auditory comprehension: Impaired: complex YES/NO questions: Impaired: complex Following directions: Impaired: complex Conversation: Simple Interfering components: hearing - At times, pt appeared to "not hear" SLP; recommend pt see audiologist Effective technique: repetition/stressing words   READING COMPREHENSION: Intact  EXPRESSION: verbal  VERBAL EXPRESSION: Level of generative/spontaneous verbalization: word and phrase Automatic speech: name: intact and social response: impaired  Repetition: Appears intact Naming: Responsive: 76-100%, Confrontation: 76-100%, Convergent: 76-100%, and Divergent: 0-25% Pragmatics: Appears intact Interfering components: speech intelligibility Effective technique: open ended questions Non-verbal means of communication: N/A  WRITTEN EXPRESSION: Dominant hand: right  Written  expression: Impaired: word  MOTOR SPEECH: Overall motor speech: impaired Level of impairment: Phrase Respiration: diaphragmatic/abdominal breathing Phonation: normal Resonance: WFL Articulation: Impaired: phrase and sentence Intelligibility: Intelligibility reduced Motor planning: Impaired: aware and inconsistent Motor speech errors: aware and inconsistent Interfering components:  Effective technique: slow rate and over articulate   ORAL MOTOR EXAMINATION Facial : WFL Lingual: WFL Velum: WFL Mandible: WFL Cough: WFL Voice: WFL   STANDARDIZED ASSESSMENTS:   Western Aphasia Battery- Revised  Spontaneous Speech                           Information content               8/10                                            Fluency                                 5/10  Comprehension     Yes/No questions                 54/60                                           Auditory Word Recognition  58/60                                Sequential Commands       62/80                              Repetition                             88/100                                        Naming    Object Naming                     60/60                                           Word Fluency                        4/20                                            Sentence Completion          10/10                                             Responsive Speech              8/10                                         Aphasia Quotient                  77/100         Pt's severity rating was mild as indicated by an Aphasia Quotient of 77 (0-25=very severe, 26-50=severe, 51-75=moderate, 76 and above is mild). 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  TODAY'S TREATMENT:  Skilled treatment focused on pt's use  of writing and texting as a strategy to resolve communication breakdowns. While pt appeared able to spell common information, his handwriting was largely illegible. SLP also presented cell phone to assess feasibility of texting word into a note during moments of word finding difficulty. Pt was able to spell each word but hand difficulty selecting correct  letter d/t large finger. When provided with a stylus, pt had improved accuracy but was not able to navigate functions such as space, delete.    PATIENT EDUCATION: Education details: time post onset, results of this assessment, POC Person educated: Patient Education method: Explanation Education comprehension: verbalized understanding   GOALS: Goals reviewed with patient? Yes  SHORT TERM GOALS: Target date: 10 sessions  Pt will learn strategies (such as drawing, gestures) to aid during communication breakdowns.  Baseline: only using circumlocutions Goal status: INITIAL  2.  With minimal assistance, pt will use strategies to help during communication breakdowns.  Baseline: new goal Goal status: INITIAL   LONG TERM GOALS: Target date: 05/25/2022  Pt will utilize strategies to aid in communication breakdowns with Mod I.  Baseline: only using circumlocutions Goal status: INITIAL   ASSESSMENT:  CLINICAL IMPRESSION: Patient is a 76 y.o. male who was seen today for speech language evaluation d/t residual deficits from Left MCA CVA in 2013. 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 While pt's receptive language skills appear largely intact, he appeared to have difficulty hearing in which he turned his head towards listener and requested repetition of some verbal information. Education provided on recommend for an audiological assessment to rule out hearing loss.    OBJECTIVE IMPAIRMENTS include expressive language and aphasia. These impairments are limiting patient from  effectively communicating at home and in community. Factors affecting potential to achieve goals and functional outcome are time post onset (10 years), previous ST intervention. Patient will benefit from skilled SLP services to address above impairments and improve overall function.  REHAB POTENTIAL: Fair time post onset (10 years)  PLAN: SLP FREQUENCY: 1-2x/week  SLP DURATION: 8 weeks  PLANNED INTERVENTIONS: Language facilitation, Internal/external aids, SLP instruction and feedback, Compensatory strategies, and Patient/family education      Earland Reish B. Rutherford Nail, M.S., CCC-SLP, Mining engineer Certified Brain Injury [place]  [place] Office [phone] Ascom [phone] Fax [phone]

## 2022-04-02 NOTE — Addendum Note (Signed)
Addended by: Stormy Fabian B on: 04/02/2022 10:27 AM   Modules accepted: Orders

## 2022-04-04 ENCOUNTER — Ambulatory Visit: Payer: Medicare HMO | Admitting: Speech Pathology

## 2022-04-07 ENCOUNTER — Ambulatory Visit: Payer: Medicare HMO | Attending: Neurology | Admitting: Speech Pathology

## 2022-04-07 DIAGNOSIS — R482 Apraxia: Secondary | ICD-10-CM | POA: Insufficient documentation

## 2022-04-07 DIAGNOSIS — R4701 Aphasia: Secondary | ICD-10-CM | POA: Diagnosis not present

## 2022-04-07 DIAGNOSIS — I63512 Cerebral infarction due to unspecified occlusion or stenosis of left middle cerebral artery: Secondary | ICD-10-CM | POA: Diagnosis not present

## 2022-04-07 NOTE — Therapy (Signed)
OUTPATIENT SPEECH LANGUAGE PATHOLOGY TREATMENT NOTE   Patient Name: Paul Bass MRN: [id number] DOB:July 16, 1946, 76 y.o., male Today's Date: 04/07/2022  PCP: [name], NP REFERRING PROVIDER: Jennings Books, MD  END OF SESSION:   End of Session - 04/07/22 1515     Visit Number 2    Number of Visits 17    Date for SLP Re-Evaluation 05/25/22    Authorization Type [employer] HMO    Authorization Time Period start 03/30/2022    Authorization - Visit Number 2    Progress Note Due on Visit 10    SLP Start Time 0830    SLP Stop Time  0900    SLP Time Calculation (min) 30 min    Activity Tolerance Patient tolerated treatment well             Past Medical History:  Diagnosis Date   Accelerated junctional rhythm    Anemia    Anxiety    a.) Tx'd with BZO PRN   Arthritis    Bilateral carotid artery disease ([name])    BPH (benign prostatic hypertrophy)    CAD S/P percutaneous coronary angioplasty    a.) PCI in 2002 placing a RCA stent (unknown type). b.) LHC 12/07/2015: 5% ISR m-dRCA, 90% dRCA, 25% pRCA, 25% p-mLCx, 100% OM3, 95% oOM2-OM2, 75% m-dLAD; refer to CVTS. c.) 3v CABG 01/03/2016   CHF (congestive heart failure) ([place])    a.) TTE 05/12/2012: EF 50-55%; mild LA enlargement; G1DD. b.)  TTE 11/26/2015: EF 45%; mild BAE; triv PR, mild MR/TR; inferolateral HK; G1DD. c.)  TTE 11/07/2017: EF 35%; RV enlargement; BAE; inferior and inferolateral HK; triv PR; mild MR/TR. d.)  TTE 09/17/2019: EF 35%; mild LVH; triv MR/TR. e.)  TTE 08/11/2021: EF 50%; triv MR/TR; G1DD.   Cognitive deficit following cerebrovascular accident (CVA)    COPD (chronic obstructive pulmonary disease) ([name])    Cortical cataract    Depression    Dyspnea    GERD (gastroesophageal reflux disease)    History of 2019 novel coronavirus disease (COVID-19) 01/19/2021   History of acute inferior wall MI 2002   a.) PCI was performed placing a RCA stent (unknown type)   History of kidney stones    HTN  (hypertension)    Hyperlipidemia    Hypothyroidism    Long term current use of antithrombotics/antiplatelets    a.) DAPT therapy (ASA + clopidogrel)   OSA (obstructive sleep apnea)    a.) does not require nocturnal PAP therapy   PLMD (periodic limb movement disorder)    Postoperative atrial fibrillation ([place]) 01/03/2016   a.) following CABG procedure   Prostatitis    PVD (peripheral vascular disease) ([place])    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 b/) residual RIGHT sided weakness   T2DM (type 2 diabetes mellitus) ([place])    Tobacco abuse    Past Surgical History:  Procedure Laterality Date   CARDIAC CATHETERIZATION N/A 12/07/2015   Procedure: Left Heart Cath and Coronary Angiography;  Surgeon: Corey Skains, MD;  Location: [place] [facility];  Service: Cardiovascular;  Laterality: N/A;   CATARACT EXTRACTION W/PHACO Left 03/24/2021   Procedure: CATARACT EXTRACTION PHACO AND INTRAOCULAR LENS PLACEMENT (IOC) LEFT DIABETIC 6.67 00:42.5;  Surgeon: Birder Robson, MD;  Location: [facility];  Service: Ophthalmology;  Laterality: Left;   COLONOSCOPY WITH PROPOFOL N/A 02/18/2019   Procedure: COLONOSCOPY WITH PROPOFOL;  Surgeon: Lucilla Lame, MD;  Location: [facility];  Service: Endoscopy;  Laterality: N/A;   COLONOSCOPY WITH PROPOFOL N/A 12/22/2021   Procedure: COLONOSCOPY WITH PROPOFOL;  Surgeon: Lucilla Lame, MD;  Location: [facility]COPY;  Service: Endoscopy;  Laterality: N/A;   CORONARY ANGIOPLASTY WITH STENT PLACEMENT Left 2002   CORONARY ARTERY BYPASS GRAFT N/A 01/03/2016   Procedure: 3v CORONARY ARTERY BYPASS GRAFT (LIMA-LAD, SVG-PDA, SVG-OM2); Location: [facility]; Surgeon: Dwaine Deter, MD   CYSTOSCOPY W/ URETERAL STENT REMOVAL N/A 10/04/2021    Procedure: CYSTOSCOPY WITH PROSTATE FOREIGN BODY REMOVAL;  Surgeon: Abbie Sons, MD;  Location: [facility];  Service: Urology;  Laterality: N/A;   CYSTOSCOPY WITH INSERTION OF UROLIFT N/A 04/29/2019   Procedure: CYSTOSCOPY WITH INSERTION OF UROLIFT;  Surgeon: Abbie Sons, MD;  Location: [facility];  Service: Urology;  Laterality: N/A;   ESOPHAGOGASTRODUODENOSCOPY (EGD) WITH PROPOFOL N/A 09/11/2016   Procedure: ESOPHAGOGASTRODUODENOSCOPY (EGD) WITH PROPOFOL;  Surgeon: Lucilla Lame, MD;  Location: [facility] ENDOSCOPY;  Service: Endoscopy;  Laterality: N/A;   ESOPHAGOGASTRODUODENOSCOPY (EGD) WITH PROPOFOL N/A 10/17/2016   Procedure: ESOPHAGOGASTRODUODENOSCOPY (EGD) WITH PROPOFOL;  Surgeon: Lucilla Lame, MD;  Location: [facility] ENDOS[facility];  Service: Endoscopy;  Laterality: N/A;   ESOPHAGOGASTRODUODENOSCOPY (EGD) WITH PROPOFOL N/A 11/13/2017   Procedure: ESOPHAGOGASTRODUODENOSCOPY (EGD) WITH PROPOFOL;  Surgeon: Lucilla Lame, MD;  Location: [facility] ENDOSCOPY;  Service: Endoscopy;  Laterality: N/A;   pins R lower leg Left    rotator cuff replaced Right    SAVORY DILATION N/A 10/17/2016   Procedure: SAVORY DILATION;  Surgeon: Lucilla Lame, MD;  Location: [facility] ENDOSCOPY;  Service: Endoscopy;  Laterality: N/A;   TRANSURETHRAL INCISION OF PROSTATE N/A 10/04/2021   Procedure: TRANSURETHRAL INCISION OF THE PROSTATE (TUIP);  Surgeon: Abbie Sons, MD;  Location: [facility];  Service: Urology;  Laterality: N/A;   Patient Active Problem List   Diagnosis Date Noted   History of colonic polyps    Anxiety state 11/17/2021   Cognitive deficit, post-stroke 11/17/2021   Ex-smoker 11/17/2021   Bradycardia 40/98/1191   Chronic systolic CHF (congestive heart failure), NYHA class 2 ([place]) 05/27/2020   Accelerated junctional rhythm 04/21/2019   Encounter for screening colonoscopy    Polyp of colon    Atherosclerotic heart disease of native coronary artery without angina pectoris 01/14/2019   Depression 01/14/2019   GERD  (gastroesophageal reflux disease) 01/14/2019   Periodic limb movement disorder (PLMD) 01/14/2019   Sleep apnea 01/14/2019   Type 2 diabetes mellitus without complications ([place]) 47/82/9562   Stricture and stenosis of esophagus    Problems with swallowing and mastication    Food impaction of esophagus    H/O coronary artery bypass surgery 01/17/2016   Cerebrovascular accident (CVA) due to embolism ([place]) 01/03/2016   Hypothyroidism, unspecified 01/03/2016   HLD (hyperlipidemia) 01/03/2016   Stable angina ([place]) 11/29/2015   Benign essential hypertension 05/27/2015   Bilateral carotid artery stenosis 02/24/2015   Atherosclerotic peripheral vascular disease ([place]) 09/07/2014   Bladder calculus 08/12/2014   Primary osteoarthritis of left knee 03/10/2014   Elevated prostate specific antigen (PSA) 02/05/2013   Snoring 01/22/2013   Sleepiness 01/22/2013   Dysphagia, unspecified(787.20) 01/22/2013   Occlusion and stenosis of carotid artery without mention of cerebral infarction 01/22/2013   Cerebral thrombosis with cerebral infarction ([place]) 01/22/2013   Benign prostatic hyperplasia with incomplete bladder emptying 08/26/2012   Chronic prostatitis 08/26/2012  Family history of malignant neoplasm of prostate 08/26/2012   Incomplete emptying of bladder 08/26/2012   Dysphagia 05/14/2012   Global aphasia 05/14/2012   Stroke, acute, thrombotic ([place]) 05/12/2012   Symptomatic carotid artery stenosis 05/12/2012   HTN (hypertension) 05/12/2012   COPD (chronic obstructive pulmonary disease) ([place]) 05/12/2012   Acute respiratory failure ([place]) 05/12/2012    ONSET DATE: 05/13/2012  REFERRING DIAG: K53.976 (ICD-10-CM) - Cognitive deficit, post-stroke   PERTINENT HISTORY: Pt is a 76 year old male with past medical hx of anxiety, COP, depression, diabetes mellitus without complication, HTN, hyperlipidemia.    Of note, pt received Outpatient ST services for a total of 28 sessions ending on 03/24/2020. At  the end of that POC pt required moderate assistance.      DIAGNOSTIC FINDINGS:  MRI 10/03/2019  No acute intracranial abnormality. 2. Moderate-sized chronic left MCA infarct. 3. Mild chronic small vessel ischemic disease, mildly progressed from 2014. 4. Progressive cerebral atrophy.  THERAPY DIAG:  Aphasia  Left middle cerebral artery stroke [facility])  Rationale for Evaluation and Treatment Rehabilitation  SUBJECTIVE: pt pleasant, brought in written note from his wife regarding attempt to make audiology appt  Pt accompanied by: self  PAIN:  Are you having pain? No  PATIENT GOALS: he would like to be able to communicate better   OBJECTIVE:   TODAY'S TREATMENT: Skilled treatment session targeted pt's word finding and communication breakdown goals. SLP facilitated session by providing the following interventions:  Use of writing to repair communication breakdowns was ineffective as total assistance was required to write larger, print instead of cursive as well as inability to spell while writing.   Use of drawing a simplistic picture was also ineffective as pt's drawing was small and not representative of concept being communication ( eg. [place]).  Use of letter board to spell simple words had some potential as pt was able to point to letters to spell basic words with ~ 75% and moderate assistance.     PATIENT EDUCATION: Education details: referral request sent to neurologist for audiology Person educated: Patient Education method: Explanation and Handouts Education comprehension: verbalized understanding  GOALS: Goals reviewed with patient? Yes   SHORT TERM GOALS: Target date: 10 sessions   Pt will learn strategies (such as drawing, gestures) to aid during communication breakdowns.  Baseline: only using circumlocutions Goal status: INITIAL   2.  With minimal assistance, pt will use strategies to help during communication breakdowns.  Baseline: new goal Goal status:  INITIAL     LONG TERM GOALS: Target date: 05/25/2022   Pt will utilize strategies to aid in communication breakdowns with Mod I.  Baseline: only using circumlocutions Goal status: INITIAL  ASSESSMENT:  CLINICAL IMPRESSION: Pt presents with worsening word finding deficits and increased communication breakdowns.   OBJECTIVE IMPAIRMENTS include expressive language and aphasia. These impairments are limiting patient from effectively communicating at home and in community. Factors affecting potential to achieve goals and functional outcome are  time post onset (10 years) and previous ST intervention . Patient will benefit from skilled SLP services to address above impairments and improve overall function.  REHAB POTENTIAL: Fair time post onset (10 years)  PLAN: SLP FREQUENCY: 1-2x/week  SLP DURATION: 8 weeks  PLANNED INTERVENTIONS: Language facilitation, Internal/external aids, SLP instruction and feedback, Compensatory strategies, and Patient/family education   Jaylinn Hellenbrand B. Rutherford Nail, M.S., CCC-SLP, Mining engineer Certified Brain Injury [place]  [place] Office [phone] Ascom [phone] Fax [phone]

## 2022-04-09 ENCOUNTER — Encounter (INDEPENDENT_AMBULATORY_CARE_PROVIDER_SITE_OTHER): Payer: Self-pay | Admitting: Nurse Practitioner

## 2022-04-09 NOTE — Progress Notes (Signed)
Subjective:    Patient ID: Paul Bass, male    DOB: [birth year], 76 y.o.   MRN: [id number] No chief complaint on file.   MARICELA KAWAHARA is a 76 y.o. male.  The patient returns today for evaluation of his carotid artery stenosis. The patient reports having a stroke and having a carotid stent placed back in 2013.  It sounds like he had a subsequent carotid angiogram a year or so later.  The patient has done well recently.  He does still have some residual sequela from his stroke with speech and discoordination. Carotid duplex performed by his cardiologist about 2 months ago demonstrated 50 to 69% right ICA stenosis and recurrent stenosis in his left ICA stent approaching if not exceeding 70%.  Today, the patient has a noted 40 to 59% stenosis in the right ICA with a 1 to 39% stenosis in the left ICA.  It is noted that the common carotid artery of the left has a hemodynamically significant plaque however the velocities are not consistent with hemodynamically significant stenosis.    Review of Systems  All other systems reviewed and are negative.      Objective:   Physical Exam Vitals reviewed.  HENT:     Head: Normocephalic.  Neck:     Vascular: No carotid bruit.  Cardiovascular:     Rate and Rhythm: Normal rate.     Pulses: Normal pulses.  Pulmonary:     Effort: Pulmonary effort is normal.  Skin:    General: Skin is warm and dry.  Neurological:     Mental Status: He is alert and oriented to person, place, and time.  Psychiatric:        Mood and Affect: Mood normal.        Behavior: Behavior normal.        Thought Content: Thought content normal.        Judgment: Judgment normal.     BP 118/73 (BP Location: Right Arm)   Pulse 67   Resp 17   Ht '5\' 11"'$  (1.803 m)   Wt 210 lb 6.4 oz (95.4 kg)   BMI 29.34 kg/m   Past Medical History:  Diagnosis Date   Accelerated junctional rhythm    Anemia    Anxiety    a.) Tx'd with BZO PRN   Arthritis    Bilateral carotid artery  disease ([name])    BPH (benign prostatic hypertrophy)    CAD S/P percutaneous coronary angioplasty    a.) PCI in 2002 placing a RCA stent (unknown type). b.) LHC 12/07/2015: 5% ISR m-dRCA, 90% dRCA, 25% pRCA, 25% p-mLCx, 100% OM3, 95% oOM2-OM2, 75% m-dLAD; refer to CVTS. c.) 3v CABG 01/03/2016   CHF (congestive heart failure) ([place])    a.) TTE 05/12/2012: EF 50-55%; mild LA enlargement; G1DD. b.)  TTE 11/26/2015: EF 45%; mild BAE; triv PR, mild MR/TR; inferolateral HK; G1DD. c.)  TTE 11/07/2017: EF 35%; RV enlargement; BAE; inferior and inferolateral HK; triv PR; mild MR/TR. d.)  TTE 09/17/2019: EF 35%; mild LVH; triv MR/TR. e.)  TTE 08/11/2021: EF 50%; triv MR/TR; G1DD.   Cognitive deficit following cerebrovascular accident (CVA)    COPD (chronic obstructive pulmonary disease) ([name])    Cortical cataract    Depression    Dyspnea    GERD (gastroesophageal reflux disease)    History of 2019 novel coronavirus disease (COVID-19) 01/19/2021   History of acute inferior wall MI 2002   a.) PCI was performed placing a RCA  stent (unknown type)   History of kidney stones    HTN (hypertension)    Hyperlipidemia    Hypothyroidism    Long term current use of antithrombotics/antiplatelets    a.) DAPT therapy (ASA + clopidogrel)   OSA (obstructive sleep apnea)    a.) does not require nocturnal PAP therapy   PLMD (periodic limb movement disorder)    Postoperative atrial fibrillation ([place]) 01/03/2016   a.) following CABG procedure   Prostatitis    PVD (peripheral vascular disease) ([place])    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 b/) residual RIGHT sided weakness   T2DM (type 2 diabetes mellitus) ([place])    Tobacco abuse     Social History   Socioeconomic History    Marital status: Married    Spouse name: Vaughan Basta   Number of children: 1   Years of education: HS   Highest education level: Not on file  Occupational History   Occupation: Retired    Comment: Part Time [employer]  Tobacco Use   Smoking status: Former    Packs/day: 2.00    Types: Cigarettes    Quit date: 05/20/2012    Years since quitting: 9.8   Smokeless tobacco: Never  Vaping Use   Vaping Use: Never used  Substance and Sexual Activity   Alcohol use: Not Currently    Comment: weekend drinker, had 5 beers on 05/11/12, prior to stroke, No alcohol since stroke   Drug use: Never   Sexual activity: Yes    Birth control/protection: None  Other Topics Concern   Not on file  Social History Narrative   Patient lives at home with his wife works at  [employer] has a 12 grade education with 1 child.   Patient quit smoking 05-11-2012 patient drinks alcoholic drinks he also drinks cafinted drinks daily.   Social Determinants of Health   Financial Resource Strain: Not on file  Food Insecurity: Not on file  Transportation Needs: Not on file  Physical Activity: Not on file  Stress: Not on file  Social Connections: Not on file  Intimate Partner Violence: Not on file    Past Surgical History:  Procedure Laterality Date   CARDIAC CATHETERIZATION N/A 12/07/2015   Procedure: Left Heart Cath and Coronary Angiography;  Surgeon: Corey Skains, MD;  Location: [place] [facility];  Service: Cardiovascular;  Laterality: N/A;   CATARACT EXTRACTION W/PHACO Left 03/24/2021   Procedure: CATARACT EXTRACTION PHACO AND INTRAOCULAR LENS PLACEMENT (IOC) LEFT DIABETIC 6.67 00:42.5;  Surgeon: Birder Robson, MD;  Location: [facility];  Service: Ophthalmology;  Laterality: Left;   COLONOSCOPY WITH PROPOFOL N/A 02/18/2019   Procedure: COLONOSCOPY WITH PROPOFOL;  Surgeon: Lucilla Lame, MD;  Location: [facility] ENDOSCOPY;  Service: Endoscopy;  Laterality: N/A;   COLONOSCOPY WITH PROPOFOL N/A 12/22/2021   Procedure:  COLONOSCOPY WITH PROPOFOL;  Surgeon: Lucilla Lame, MD;  Location: [facility]COPY;  Service: Endoscopy;  Laterality: N/A;   CORONARY ANGIOPLASTY WITH STENT PLACEMENT Left 2002   CORONARY ARTERY BYPASS GRAFT N/A 01/03/2016   Procedure: 3v CORONARY ARTERY BYPASS GRAFT (LIMA-LAD, SVG-PDA, SVG-OM2); Location: [facility]; Surgeon: Dwaine Deter, MD   CYSTOSCOPY W/ URETERAL STENT REMOVAL N/A 10/04/2021   Procedure: CYSTOSCOPY WITH PROSTATE FOREIGN BODY REMOVAL;  Surgeon: Abbie Sons, MD;  Location: [facility];  Service: Urology;  Laterality: N/A;   CYSTOSCOPY WITH INSERTION OF UROLIFT N/A 04/29/2019   Procedure: CYSTOSCOPY WITH INSERTION OF UROLIFT;  Surgeon: Abbie Sons, MD;  Location: [facility];  Service: Urology;  Laterality: N/A;   ESOPHAGOGASTRODUODENOSCOPY (EGD) WITH PROPOFOL N/A 09/11/2016   Procedure: ESOPHAGOGASTRODUODENOSCOPY (EGD) WITH PROPOFOL;  Surgeon: Lucilla Lame, MD;  Location: [facility] ENDOSCOPY;  Service: Endoscopy;  Laterality: N/A;   ESOPHAGOGASTRODUODENOSCOPY (EGD) WITH PROPOFOL N/A 10/17/2016   Procedure: ESOPHAGOGASTRODUODENOSCOPY (EGD) WITH PROPOFOL;  Surgeon: Lucilla Lame, MD;  Location: [facility];  Service: Endoscopy;  Laterality: N/A;   ESOPHAGOGASTRODUODENOSCOPY (EGD) WITH PROPOFOL N/A 11/13/2017   Procedure: ESOPHAGOGASTRODUODENOSCOPY (EGD) WITH PROPOFOL;  Surgeon: Lucilla Lame, MD;  Location: [facility];  Service: Endoscopy;  Laterality: N/A;   pins R lower leg Left    rotator cuff replaced Right    SAVORY DILATION N/A 10/17/2016   Procedure: SAVORY DILATION;  Surgeon: Lucilla Lame, MD;  Location: [facility] END[facility];  Service: Endoscopy;  Laterality: N/A;   TRANSURETHRAL INCISION OF PROSTATE N/A 10/04/2021   Procedure: TRANSURETHRAL INCISION OF THE PROSTATE (TUIP);  Surgeon: Abbie Sons, MD;  Location: [facility];  Service: Urology;  Laterality: N/A;    Family History  Problem Relation Age of Onset   Heart failure Father    Throat cancer Mother    Diabetes Brother      No Known Allergies     Latest Ref Rng & Units 07/01/2015    8:57 AM 09/20/2012    7:19 AM 05/13/2012   12:00 AM  CBC  WBC 4.0 - 10.5 K/uL 4.7  5.2  9.2   Hemoglobin 13.0 - 17.0 g/dL 14.5  14.8  13.2   Hematocrit 39.0 - 52.0 % 41.6  40.8  38.4   Platelets 150 - 400 K/uL 122  159  117       CMP     Component Value Date/Time   NA 136 07/01/2015 0857   NA 141 05/11/2012 1929   K 4.3 07/01/2015 0857   K 3.9 05/11/2012 1929   CL 103 07/01/2015 0857   CL 107 05/11/2012 1929   CO2 25 07/01/2015 0857   CO2 26 05/11/2012 1929   GLUCOSE 176 (H) 07/01/2015 0857   GLUCOSE 144 (H) 05/11/2012 1929   BUN 14 07/01/2015 0857   BUN 11 05/11/2012 1929   CREATININE 1.15 07/01/2015 0857   CREATININE 0.85 05/11/2012 1929   CALCIUM 9.2 07/01/2015 0857   CALCIUM 8.5 05/11/2012 1929   PROT 7.1 05/11/2012 1929   ALBUMIN 3.5 05/11/2012 1929   AST 23 05/11/2012 1929   ALT 35 05/11/2012 1929   ALKPHOS 110 05/11/2012 1929   BILITOT 0.3 05/11/2012 1929   GFRNONAA >60 07/01/2015 0857   GFRNONAA >60 05/11/2012 1929   GFRAA >60 07/01/2015 0857   GFRAA >60 05/11/2012 1929     No results found.     Assessment & Plan:   1. Symptomatic stenosis of both carotid arteries Today the velocities are drastically decreased from the previous studies that were done at his cardiologist office several months ago.  We discussed possible options including repeat ultrasound in several months or a CTA to further evaluate, however his previous stent placement may cause artifact which may limit evaluation somewhat.  Lastly we could proceed with an angiogram with intention to treat the stent if it is indeed greater than 70%.  The patient does note that he has upcoming MRI scheduled.  We discussed that the MRI may  show additional information for treatment making however it is possible that the MRI may not give [place] a clear picture of his carotid.  Once the patient undergoes his MRI we will review and then from there we  can move forward with next steps.  Patient will contact us once his MRI has been completed. - VAS US CAROTID  2. Primary hypertension Continue antihypertensive medications as already ordered, these medications have been reviewed and there are no changes at this time.   3. Type 2 diabetes mellitus without complication, unspecified whether long term insulin use ([place]) Continue hypoglycemic medications as already ordered, these medications have been reviewed and there are no changes at this time.  Hgb A1C to be monitored as already arranged by primary service    Current Outpatient Medications on File Prior to Visit  Medication Sig Dispense Refill   albuterol (VENTOLIN HFA) 108 (90 Base) MCG/ACT inhaler SMARTSIG:2 inhalation Via Inhaler Every 4 Hours PRN     atorvastatin (LIPITOR) 80 MG tablet Take 80 mg by mouth daily.      buPROPion (WELLBUTRIN XL) 150 MG 24 hr tablet Take 150 mg by mouth at bedtime.     cholecalciferol (VITAMIN D3) 25 MCG (1000 UT) tablet Take 1,000 Units by mouth every evening.     clonazePAM (KLONOPIN) 0.5 MG tablet Take 0.5 mg by mouth 2 (two) times daily.     clopidogrel (PLAVIX) 75 MG tablet Take 75 mg by mouth daily.   1   Coenzyme Q10 (COQ10) 100 MG CAPS Take 100 mg by mouth at bedtime.     Ferrous Sulfate (IRON) 325 (65 Fe) MG TABS Take 325 mg by mouth daily.     fluticasone-salmeterol (ADVAIR) 500-50 MCG/ACT AEPB Inhale into the lungs.     glucose blood test strip      levothyroxine (SYNTHROID, LEVOTHROID) 100 MCG tablet Take 100 mcg by mouth daily before breakfast.     metFORMIN (GLUCOPHAGE) 500 MG tablet Take 500 mg by mouth 2 (two) times daily.      metoprolol tartrate (LOPRESSOR) 25 MG tablet Take 25 mg by mouth 2 (two) times daily.     Multiple Vitamin (MULTIVITAMIN WITH MINERALS) TABS Take 1 tablet by mouth daily.     naproxen sodium (ALEVE) 220 MG tablet Take 220-440 mg by mouth 2 (two) times daily as needed (pain.).     pantoprazole (PROTONIX) 40 MG tablet  Take 1 tablet (40 mg total) by mouth at bedtime. 90 tablet 3   psyllium (METAMUCIL SMOOTH TEXTURE) 28 % packet Take 1 packet by mouth daily.     RA KRILL OIL 500 MG CAPS Take 500 mg by mouth at bedtime.      venlafaxine XR (EFFEXOR-XR) 150 MG 24 hr capsule Take 150 mg by mouth daily.     No current facility-administered medications on file prior to visit.    There are no Patient Instructions on file for this visit. No follow-ups on file.   Kris Hartmann, NP

## 2022-04-10 ENCOUNTER — Ambulatory Visit: Payer: Medicare HMO | Admitting: Speech Pathology

## 2022-04-10 ENCOUNTER — Ambulatory Visit
Admission: RE | Admit: 2022-04-10 | Discharge: 2022-04-10 | Disposition: A | Discharge status: Home / Self Care | Payer: Medicare HMO | Source: Ambulatory Visit | Attending: Student | Admitting: Student

## 2022-04-10 DIAGNOSIS — R482 Apraxia: Secondary | ICD-10-CM | POA: Diagnosis not present

## 2022-04-10 DIAGNOSIS — R29898 Other symptoms and signs involving the musculoskeletal system: Secondary | ICD-10-CM | POA: Diagnosis not present

## 2022-04-10 DIAGNOSIS — M47812 Spondylosis without myelopathy or radiculopathy, cervical region: Secondary | ICD-10-CM | POA: Diagnosis not present

## 2022-04-10 DIAGNOSIS — I63512 Cerebral infarction due to unspecified occlusion or stenosis of left middle cerebral artery: Secondary | ICD-10-CM

## 2022-04-10 DIAGNOSIS — M2578 Osteophyte, vertebrae: Secondary | ICD-10-CM | POA: Diagnosis not present

## 2022-04-10 DIAGNOSIS — M4319 Spondylolisthesis, multiple sites in spine: Secondary | ICD-10-CM | POA: Diagnosis not present

## 2022-04-10 DIAGNOSIS — R4701 Aphasia: Secondary | ICD-10-CM

## 2022-04-10 NOTE — Therapy (Signed)
OUTPATIENT SPEECH LANGUAGE PATHOLOGY TREATMENT NOTE   Patient Name: Paul Bass MRN: [id number] DOB:09-Apr-1946, 76 y.o., male Today's Date: 04/10/2022  PCP: [name], NP REFERRING PROVIDER: Jennings Books, MD  END OF SESSION:   End of Session - 04/10/22 0913     Visit Number 3    Number of Visits 17    Date for SLP Re-Evaluation 05/25/22    Authorization Type Human Medicare HMO    Authorization Time Period start 03/30/2022    Authorization - Visit Number 3    Authorization - Number of Visits 10    Progress Note Due on Visit 10    SLP Start Time 0900    SLP Stop Time  1000    SLP Time Calculation (min) 60 min    Activity Tolerance Patient tolerated treatment well             Past Medical History:  Diagnosis Date   Accelerated junctional rhythm    Anemia    Anxiety    a.) Tx'd with BZO PRN   Arthritis    Bilateral carotid artery disease ([name])    BPH (benign prostatic hypertrophy)    CAD S/P percutaneous coronary angioplasty    a.) PCI in 2002 placing a RCA stent (unknown type). b.) LHC 12/07/2015: 5% ISR m-dRCA, 90% dRCA, 25% pRCA, 25% p-mLCx, 100% OM3, 95% oOM2-OM2, 75% m-dLAD; refer to CVTS. c.) 3v CABG 01/03/2016   CHF (congestive heart failure) ([place])    a.) TTE 05/12/2012: EF 50-55%; mild LA enlargement; G1DD. b.)  TTE 11/26/2015: EF 45%; mild BAE; triv PR, mild MR/TR; inferolateral HK; G1DD. c.)  TTE 11/07/2017: EF 35%; RV enlargement; BAE; inferior and inferolateral HK; triv PR; mild MR/TR. d.)  TTE 09/17/2019: EF 35%; mild LVH; triv MR/TR. e.)  TTE 08/11/2021: EF 50%; triv MR/TR; G1DD.   Cognitive deficit following cerebrovascular accident (CVA)    COPD (chronic obstructive pulmonary disease) ([name])    Cortical cataract    Depression    Dyspnea    GERD (gastroesophageal reflux disease)    History of 2019 novel coronavirus disease (COVID-19) 01/19/2021   History of acute inferior wall MI 2002   a.) PCI was performed placing a RCA stent (unknown  type)   History of kidney stones    HTN (hypertension)    Hyperlipidemia    Hypothyroidism    Long term current use of antithrombotics/antiplatelets    a.) DAPT therapy (ASA + clopidogrel)   OSA (obstructive sleep apnea)    a.) does not require nocturnal PAP therapy   PLMD (periodic limb movement disorder)    Postoperative atrial fibrillation ([place]) 01/03/2016   a.) following CABG procedure   Prostatitis    PVD (peripheral vascular disease) ([place])    S/P CABG x 3 01/03/2016   a.) LIMA-LAD, SVG-PDA, SVG-OM2   Stroke ([place]) 04/2012   a.) LEFT M1 occlusion from possible moderate LEFT ICA stenosis; Tx with TPA + mechanical embolectomy with Trevo Provue retrieval device with full recanalization and proximal LEFT ICA rescue stent. b/) residual RIGHT sided weakness   T2DM (type 2 diabetes mellitus) ([place])    Tobacco abuse    Past Surgical History:  Procedure Laterality Date   CARDIAC CATHETERIZATION N/A 12/07/2015   Procedure: Left Heart Cath and Coronary Angiography;  Surgeon: Corey Skains, MD;  Location: [facility];  Service: Cardiovascular;  Laterality: N/A;   CATARACT EXTRACTION W/PHACO Left 03/24/2021   Procedure: CATARACT EXTRACTION PHACO AND INTRAOCULAR LENS PLACEMENT (IOC)  LEFT DIABETIC 6.67 00:42.5;  Surgeon: Birder Robson, MD;  Location: [facility] ORS;  Service: Ophthalmology;  Laterality: Left;   COLONOSCOPY WITH PROPOFOL N/A 02/18/2019   Procedure: COLONOSCOPY WITH PROPOFOL;  Surgeon: Lucilla Lame, MD;  Location: [facility] ENDOSCOPY;  Service: Endoscopy;  Laterality: N/A;   COLONOSCOPY WITH PROPOFOL N/A 12/22/2021   Procedure: COLONOSCOPY WITH PROPOFOL;  Surgeon: Lucilla Lame, MD;  Location: [facility] ENDOSCOPY;  Service: Endoscopy;  Laterality: N/A;   CORONARY ANGIOPLASTY WITH STENT PLACEMENT Left 2002   CORONARY ARTERY BYPASS GRAFT N/A 01/03/2016   Procedure: 3v CORONARY ARTERY BYPASS GRAFT (LIMA-LAD, SVG-PDA, SVG-OM2); Location: [facility]; Surgeon: Dwaine Deter, MD   CYSTOSCOPY  W/ URETERAL STENT REMOVAL N/A 10/04/2021   Procedure: CYSTOSCOPY WITH PROSTATE FOREIGN BODY REMOVAL;  Surgeon: Abbie Sons, MD;  Location: [facility];  Service: Urology;  Laterality: N/A;   CYSTOSCOPY WITH INSERTION OF UROLIFT N/A 04/29/2019   Procedure: CYSTOSCOPY WITH INSERTION OF UROLIFT;  Surgeon: Abbie Sons, MD;  Location: [facility];  Service: Urology;  Laterality: N/A;   ESOPHAGOGASTRODUODENOSCOPY (EGD) WITH PROPOFOL N/A 09/11/2016   Procedure: ESOPHAGOGASTRODUODENOSCOPY (EGD) WITH PROPOFOL;  Surgeon: Lucilla Lame, MD;  Location: [facility] ENDOSCOPY;  Service: Endoscopy;  Laterality: N/A;   ESOPHAGOGASTRODUODENOSCOPY (EGD) WITH PROPOFOL N/A 10/17/2016   Procedure: ESOPHAGOGASTRODUODENOSCOPY (EGD) WITH PROPOFOL;  Surgeon: Lucilla Lame, MD;  Location: [facility] ENDOSCOPY;  Service: Endoscopy;  Laterality: N/A;   ESOPHAGOGASTRODUODENOSCOPY (EGD) WITH PROPOFOL N/A 11/13/2017   Procedure: ESOPHAGOGASTRODUODENOSCOPY (EGD) WITH PROPOFOL;  Surgeon: Lucilla Lame, MD;  Location: [facility] ENDOSCOPY;  Service: Endoscopy;  Laterality: N/A;   pins R lower leg Left    rotator cuff replaced Right    SAVORY DILATION N/A 10/17/2016   Procedure: SAVORY DILATION;  Surgeon: Lucilla Lame, MD;  Location: [facility] ENDOSCOPY;  Service: Endoscopy;  Laterality: N/A;   TRANSURETHRAL INCISION OF PROSTATE N/A 10/04/2021   Procedure: TRANSURETHRAL INCISION OF THE PROSTATE (TUIP);  Surgeon: Abbie Sons, MD;  Location: [facility];  Service: Urology;  Laterality: N/A;   Patient Active Problem List   Diagnosis Date Noted   History of colonic polyps    Anxiety state 11/17/2021   Cognitive deficit, post-stroke 11/17/2021   Ex-smoker 11/17/2021   Bradycardia 43/15/4008   Chronic systolic CHF (congestive heart failure), NYHA class 2 ([place]) 05/27/2020   Accelerated junctional rhythm 04/21/2019   Encounter for screening colonoscopy    Polyp of colon    Atherosclerotic heart disease of native coronary artery without angina pectoris  01/14/2019   Depression 01/14/2019   GERD (gastroesophageal reflux disease) 01/14/2019   Periodic limb movement disorder (PLMD) 01/14/2019   Sleep apnea 01/14/2019   Type 2 diabetes mellitus without complications ([place]) 67/61/9509   Stricture and stenosis of esophagus    Problems with swallowing and mastication    Food impaction of esophagus    H/O coronary artery bypass surgery 01/17/2016   Cerebrovascular accident (CVA) due to embolism ([place]) 01/03/2016   Hypothyroidism, unspecified 01/03/2016   HLD (hyperlipidemia) 01/03/2016   Stable angina ([place]) 11/29/2015   Benign essential hypertension 05/27/2015   Bilateral carotid artery stenosis 02/24/2015   Atherosclerotic peripheral vascular disease ([place]) 09/07/2014   Bladder calculus 08/12/2014   Primary osteoarthritis of left knee 03/10/2014   Elevated prostate specific antigen (PSA) 02/05/2013   Snoring 01/22/2013   Sleepiness 01/22/2013   Dysphagia, unspecified(787.20) 01/22/2013   Occlusion and stenosis of carotid artery without mention of cerebral infarction 01/22/2013   Cerebral thrombosis with cerebral infarction ([place]) 01/22/2013   Benign prostatic hyperplasia with incomplete  bladder emptying 08/26/2012   Chronic prostatitis 08/26/2012   Family history of malignant neoplasm of prostate 08/26/2012   Incomplete emptying of bladder 08/26/2012   Dysphagia 05/14/2012   Global aphasia 05/14/2012   Stroke, acute, thrombotic ([place]) 05/12/2012   Symptomatic carotid artery stenosis 05/12/2012   HTN (hypertension) 05/12/2012   COPD (chronic obstructive pulmonary disease) ([place]) 05/12/2012   Acute respiratory failure ([place]) 05/12/2012    ONSET DATE: 05/13/2012  REFERRING DIAG: S49.675 (ICD-10-CM) - Cognitive deficit, post-stroke   PERTINENT HISTORY: Pt is a 77 year old male with past medical hx of anxiety, COP, depression, diabetes mellitus without complication, HTN, hyperlipidemia.    Of note, pt received Outpatient ST services for a  total of 28 sessions ending on 03/24/2020. At the end of that POC pt required moderate assistance.      DIAGNOSTIC FINDINGS:  MRI 10/03/2019  No acute intracranial abnormality. 2. Moderate-sized chronic left MCA infarct. 3. Mild chronic small vessel ischemic disease, mildly progressed from 2014. 4. Progressive cerebral atrophy.  THERAPY DIAG:  Aphasia  Apraxia  Left middle cerebral artery stroke [facility])  Rationale for Evaluation and Treatment Rehabilitation  SUBJECTIVE: pt interested in trailing a SGD  Pt accompanied by: self  PAIN:  Are you having pain? No  PATIENT GOALS: he would like to be able to communicate better   OBJECTIVE:   TODAY'S TREATMENT: Skilled treatment session targeted pt's word finding and communication breakdown goals. 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 He attempts to communicate but ineffective d/t severity of expressive aphasia. Pt was not able to state basic information about his medicines with semantic paraphasias noted for his anniversary as well as his wife's age.     PATIENT EDUCATION: Education details: SGD Person educated: Patient Education method: Theatre stage manager Education  comprehension: verbalized understanding  GOALS: Goals reviewed with patient? Yes   SHORT TERM GOALS: Target date: 10 sessions   Pt will learn strategies (such as drawing, gestures) to aid during communication breakdowns.  Baseline: only using circumlocutions Goal status: INITIAL   2.  With minimal assistance, pt will use strategies to help during communication breakdowns.  Baseline: new goal Goal status: INITIAL     LONG TERM GOALS: Target date: 05/25/2022   Pt will utilize strategies to aid in communication breakdowns with Mod I.  Baseline: only using circumlocutions Goal status: INITIAL  ASSESSMENT:  CLINICAL IMPRESSION: Pt presents with moderate to severe expressive language deficits. He appears to be a good candidate to trial SGD as he was able to read the icon labels and navigate interfaces. List of needed information was written down for his wife to send in pictures etc to add to a trail device.    OBJECTIVE IMPAIRMENTS include expressive language and aphasia. These impairments are limiting patient from effectively communicating at  home and in community. Factors affecting potential to achieve goals and functional outcome are  time post onset (10 years) and previous ST intervention . Patient will benefit from skilled SLP services to address above impairments and improve overall function.  REHAB POTENTIAL: Fair time post onset (10 years)  PLAN: SLP FREQUENCY: 1-2x/week  SLP DURATION: 8 weeks  PLANNED INTERVENTIONS: Language facilitation, Internal/external aids, SLP instruction and feedback, Compensatory strategies, and Patient/family education   Ayo Smoak B. Rutherford Nail, M.S., CCC-SLP, Mining engineer Certified Brain Injury [place]  [place] Office [phone] Ascom [phone] Fax [phone]

## 2022-04-11 DIAGNOSIS — L821 Other seborrheic keratosis: Secondary | ICD-10-CM | POA: Diagnosis not present

## 2022-04-11 DIAGNOSIS — L570 Actinic keratosis: Secondary | ICD-10-CM | POA: Diagnosis not present

## 2022-04-11 DIAGNOSIS — L298 Other pruritus: Secondary | ICD-10-CM | POA: Diagnosis not present

## 2022-04-12 ENCOUNTER — Ambulatory Visit: Payer: Medicare HMO | Admitting: Speech Pathology

## 2022-04-12 DIAGNOSIS — R4701 Aphasia: Secondary | ICD-10-CM

## 2022-04-12 DIAGNOSIS — R482 Apraxia: Secondary | ICD-10-CM | POA: Diagnosis not present

## 2022-04-12 DIAGNOSIS — I63512 Cerebral infarction due to unspecified occlusion or stenosis of left middle cerebral artery: Secondary | ICD-10-CM | POA: Diagnosis not present

## 2022-04-12 NOTE — Therapy (Signed)
OUTPATIENT SPEECH LANGUAGE PATHOLOGY TREATMENT NOTE   Patient Name: Paul Bass MRN: [id number] DOB:21-Feb-1946, 76 y.o., male Today's Date: 04/12/2022  PCP: [name], NP REFERRING PROVIDER: Jennings Books, MD  END OF SESSION:   End of Session - 04/12/22 0919     Visit Number 4    Number of Visits 17    Date for SLP Re-Evaluation 05/25/22    Authorization Type Human Medicare HMO    Authorization Time Period start 03/30/2022    Authorization - Visit Number 4    Progress Note Due on Visit 10    SLP Start Time 0900    SLP Stop Time  1000    SLP Time Calculation (min) 60 min    Activity Tolerance Patient tolerated treatment well             Past Medical History:  Diagnosis Date   Accelerated junctional rhythm    Anemia    Anxiety    a.) Tx'd with BZO PRN   Arthritis    Bilateral carotid artery disease ([name])    BPH (benign prostatic hypertrophy)    CAD S/P percutaneous coronary angioplasty    a.) PCI in 2002 placing a RCA stent (unknown type). b.) LHC 12/07/2015: 5% ISR m-dRCA, 90% dRCA, 25% pRCA, 25% p-mLCx, 100% OM3, 95% oOM2-OM2, 75% m-dLAD; refer to CVTS. c.) 3v CABG 01/03/2016   CHF (congestive heart failure) ([place])    a.) TTE 05/12/2012: EF 50-55%; mild LA enlargement; G1DD. b.)  TTE 11/26/2015: EF 45%; mild BAE; triv PR, mild MR/TR; inferolateral HK; G1DD. c.)  TTE 11/07/2017: EF 35%; RV enlargement; BAE; inferior and inferolateral HK; triv PR; mild MR/TR. d.)  TTE 09/17/2019: EF 35%; mild LVH; triv MR/TR. e.)  TTE 08/11/2021: EF 50%; triv MR/TR; G1DD.   Cognitive deficit following cerebrovascular accident (CVA)    COPD (chronic obstructive pulmonary disease) ([name])    Cortical cataract    Depression    Dyspnea    GERD (gastroesophageal reflux disease)    History of 2019 novel coronavirus disease (COVID-19) 01/19/2021   History of acute inferior wall MI 2002   a.) PCI was performed placing a RCA stent (unknown type)   History of kidney stones    HTN  (hypertension)    Hyperlipidemia    Hypothyroidism    Long term current use of antithrombotics/antiplatelets    a.) DAPT therapy (ASA + clopidogrel)   OSA (obstructive sleep apnea)    a.) does not require nocturnal PAP therapy   PLMD (periodic limb movement disorder)    Postoperative atrial fibrillation ([place]) 01/03/2016   a.) following CABG procedure   Prostatitis    PVD (peripheral vascular disease) ([place])    S/P CABG x 3 01/03/2016   a.) LIMA-LAD, SVG-PDA, SVG-OM2   Stroke ([place]) 04/2012   a.) LEFT M1 occlusion from possible moderate LEFT ICA stenosis; Tx with TPA + mechanical embolectomy with Trevo Provue retrieval device with full recanalization and proximal LEFT ICA rescue stent. b/) residual RIGHT sided weakness   T2DM (type 2 diabetes mellitus) ([place])    Tobacco abuse    Past Surgical History:  Procedure Laterality Date   CARDIAC CATHETERIZATION N/A 12/07/2015   Procedure: Left Heart Cath and Coronary Angiography;  Surgeon: Corey Skains, MD;  Location: [place];  Service: Cardiovascular;  Laterality: N/A;   CATARACT EXTRACTION W/PHACO Left 03/24/2021   Procedure: CATARACT EXTRACTION PHACO AND INTRAOCULAR LENS PLACEMENT (IOC) LEFT DIABETIC 6.67 00:42.5;  Surgeon: Birder Robson, MD;  Location: [facility];  Service: Ophthalmology;  Laterality: Left;   COLONOSCOPY WITH PROPOFOL N/A 02/18/2019   Procedure: COLONOSCOPY WITH PROPOFOL;  Surgeon: Lucilla Lame, MD;  Location[facility] ENDOSCOPY;  Service: Endoscopy;  Laterality: N/A;   COLONOSCOPY WITH PROPOFOL N/A 12/22/2021   Procedure: COLONOSCOPY WITH PROPOFOL;  Surgeon: Lucilla Lame, MD;  Location: [facility] ENDOSCOPY;  Service: Endoscopy;  Laterality: N/A;   CORONARY ANGIOPLASTY WITH STENT PLACEMENT Left 2002   CORONARY ARTERY BYPASS GRAFT N/A 01/03/2016   Procedure: 3v CORONARY ARTERY BYPASS GRAFT (LIMA-LAD, SVG-PDA, SVG-OM2); Location: [facility]; Surgeon: Dwaine Deter, MD   CYSTOSCOPY W/ URETERAL STENT REMOVAL N/A 10/04/2021    Procedure: CYSTOSCOPY WITH PROSTATE FOREIGN BODY REMOVAL;  Surgeon: Abbie Sons, MD;  Location: [facility] OR[facility];  Service: Urology;  Laterality: N/A;   CYSTOSCOPY WITH INSERTION OF UROLIFT N/A 04/29/2019   Procedure: CYSTOSCOPY WITH INSERTION OF UROLIFT;  Surgeon: Abbie Sons, MD;  Location: [facility];  Service: Urology;  Laterality: N/A;   ESOPHAGOGASTRODUODENOSCOPY (EGD) WITH PROPOFOL N/A 09/11/2016   Procedure: ESOPHAGOGASTRODUODENOSCOPY (EGD) WITH PROPOFOL;  Surgeon: Lucilla Lame, MD;  Location: [facility] ENDOSCOPY;  Service: Endoscopy;  Laterality: N/A;   ESOPHAGOGASTRODUODENOSCOPY (EGD) WITH PROPOFOL N/A 10/17/2016   Procedure: ESOPHAGOGASTRODUODENOSCOPY (EGD) WITH PROPOFOL;  Surgeon: Lucilla Lame, MD;  Location: [facility] ENDOSCOPY;  Service: Endoscopy;  Laterality: N/A;   ESOPHAGOGASTRODUODENOSCOPY (EGD) WITH PROPOFOL N/A 11/13/2017   Procedure: ESOPHAGOGASTRODUODENOSCOPY (EGD) WITH PROPOFOL;  Surgeon: Lucilla Lame, MD;  Location: [facility] ENDOSCOPY;  Service: Endoscopy;  Laterality: N/A;   pins R lower leg Left    rotator cuff replaced Right    SAVORY DILATION N/A 10/17/2016   Procedure: SAVORY DILATION;  Surgeon: Lucilla Lame, MD;  Location: [facility] ENDOSCOPY;  Service: Endoscopy;  Laterality: N/A;   TRANSURETHRAL INCISION OF PROSTATE N/A 10/04/2021   Procedure: TRANSURETHRAL INCISION OF THE PROSTATE (TUIP);  Surgeon: Abbie Sons, MD;  Location: [facility] ORS;  Service: Urology;  Laterality: N/A;   Patient Active Problem List   Diagnosis Date Noted   History of colonic polyps    Anxiety state 11/17/2021   Cognitive deficit, post-stroke 11/17/2021   Ex-smoker 11/17/2021   Bradycardia 02/58/5277   Chronic systolic CHF (congestive heart failure), NYHA class 2 ([place]) 05/27/2020   Accelerated junctional rhythm 04/21/2019   Encounter for screening colonoscopy    Polyp of colon    Atherosclerotic heart disease of native coronary artery without angina pectoris 01/14/2019   Depression 01/14/2019   GERD  (gastroesophageal reflux disease) 01/14/2019   Periodic limb movement disorder (PLMD) 01/14/2019   Sleep apnea 01/14/2019   Type 2 diabetes mellitus without complications ([place]) 82/42/3536   Stricture and stenosis of esophagus    Problems with swallowing and mastication    Food impaction of esophagus    H/O coronary artery bypass surgery 01/17/2016   Cerebrovascular accident (CVA) due to embolism ([place]) 01/03/2016   Hypothyroidism, unspecified 01/03/2016   HLD (hyperlipidemia) 01/03/2016   Stable angina ([place]) 11/29/2015   Benign essential hypertension 05/27/2015   Bilateral carotid artery stenosis 02/24/2015   Atherosclerotic peripheral vascular disease ([place]) 09/07/2014   Bladder calculus 08/12/2014   Primary osteoarthritis of left knee 03/10/2014   Elevated prostate specific antigen (PSA) 02/05/2013   Snoring 01/22/2013   Sleepiness 01/22/2013   Dysphagia, unspecified(787.20) 01/22/2013   Occlusion and stenosis of carotid artery without mention of cerebral infarction 01/22/2013   Cerebral thrombosis with cerebral infarction ([place]) 01/22/2013   Benign prostatic hyperplasia with incomplete bladder emptying 08/26/2012   Chronic prostatitis 08/26/2012  Family history of malignant neoplasm of prostate 08/26/2012   Incomplete emptying of bladder 08/26/2012   Dysphagia 05/14/2012   Global aphasia 05/14/2012   Stroke, acute, thrombotic ([place]) 05/12/2012   Symptomatic carotid artery stenosis 05/12/2012   HTN (hypertension) 05/12/2012   COPD (chronic obstructive pulmonary disease) ([place]) 05/12/2012   Acute respiratory failure ([place]) 05/12/2012    ONSET DATE: 05/13/2012  REFERRING DIAG: D62.229 (ICD-10-CM) - Cognitive deficit, post-stroke   PERTINENT HISTORY: Pt is a 76 year old male with past medical hx of anxiety, COP, depression, diabetes mellitus without complication, HTN, hyperlipidemia.    Of note, pt received Outpatient ST services for a total of 28 sessions ending on 03/24/2020. At  the end of that POC pt required moderate assistance.      DIAGNOSTIC FINDINGS:  MRI 10/03/2019  No acute intracranial abnormality. 2. Moderate-sized chronic left MCA infarct. 3. Mild chronic small vessel ischemic disease, mildly progressed from 2014. 4. Progressive cerebral atrophy.  THERAPY DIAG:  Left middle cerebral artery stroke [facility])  Aphasia  Apraxia  Rationale for Evaluation and Treatment Rehabilitation  SUBJECTIVE: pt interested in trailing a SGD  Pt accompanied by: self  PAIN:  Are you having pain? No  PATIENT GOALS: he would like to be able to communicate better   OBJECTIVE:   TODAY'S TREATMENT: Skilled treatment session targeted pt's word finding and communication breakdown goals. SLP facilitated session by providing the following interventions:  Pt's wife sent pictures of pt's pets and grandchildren. Pictures were printed. Pt unable to independently name his cats or grandchildren while looking at the pictures.   SLP facilitiated word finding by writing down the names and 4-5 sentences about each picture.  For example: "Ozzy" This is Ozzy. He likes to be inside. Ozzy is more playful than Blu. Ozzy is black and he is younger than Blu. Ozzy is our long haired cat.  Pt able to read independently with 100% accuracy  SLP and pt completed device trial request for [employer].   L[employer]APH[employer] DEVICE ASSESSMENT TOOL  Physical Assessment:  - Touch Accuracy Largest Size - HIT     Large Size - HIT     Normal Size - HIT     Small Size - HIT     Smallest Size - HIT   - [employer] Lower Right Corner - HIT     Upper Left Corner - HIT     Lower Left Corner- HIT     Upper Right Corner - HIT     Middle - HIT Language Assessment  Phrase Completion - HIGH  Categorization - HIGH Repetition - HIGH Confrontational Naming - MEDIUM Conversational Speech - LOW  Device Simulation  Simple Simulation - COMPLETED  Complex Simulation - COMPLETED      PATIENT  EDUCATION: Education details: SGD Person educated: Patient and wife (via text) Education method: Explanation and Handouts Education comprehension: verbalized understanding  GOALS: Goals reviewed with patient? Yes   SHORT TERM GOALS: Target date: 10 sessions   Pt will learn strategies (such as drawing, gestures) to aid during communication breakdowns.  Baseline: only using circumlocutions Goal status: INITIAL   2.  With minimal assistance, pt will use strategies to help during communication breakdowns.  Baseline: new goal Goal status: INITIAL     LONG TERM GOALS: Target date: 05/25/2022   Pt will utilize strategies to aid in communication breakdowns with Mod I.  Baseline: only using circumlocutions Goal status: INITIAL  ASSESSMENT:  CLINICAL IMPRESSION: Pt presents with moderate to  severe expressive language deficits. Pt able to read information about his pets and grandchildren to help in word finding.   OBJECTIVE IMPAIRMENTS include expressive language and aphasia. These impairments are limiting patient from effectively communicating at home and in community. Factors affecting potential to achieve goals and functional outcome are  time post onset (10 years) and previous ST intervention . Patient will benefit from skilled SLP services to address above impairments and improve overall function.  REHAB POTENTIAL: Fair time post onset (10 years)  PLAN: SLP FREQUENCY: 1-2x/week  SLP DURATION: 8 weeks  PLANNED INTERVENTIONS: Language facilitation, Internal/external aids, SLP instruction and feedback, Compensatory strategies, and Patient/family education   Emeline Simpson B. Rutherford Nail, M.S., CCC-SLP, Mining engineer Certified Brain Injury [place]  [place] Office [phone] Ascom [phone] Fax [phone]

## 2022-04-14 ENCOUNTER — Telehealth (INDEPENDENT_AMBULATORY_CARE_PROVIDER_SITE_OTHER): Payer: Self-pay | Admitting: Nurse Practitioner

## 2022-04-14 NOTE — Telephone Encounter (Signed)
Patients wife is ok with patient moving forward with the CT, since MRI didn't show what was needed

## 2022-04-16 ENCOUNTER — Other Ambulatory Visit: Payer: Self-pay | Admitting: Urology

## 2022-04-17 ENCOUNTER — Other Ambulatory Visit (INDEPENDENT_AMBULATORY_CARE_PROVIDER_SITE_OTHER): Payer: Self-pay | Admitting: Nurse Practitioner

## 2022-04-17 ENCOUNTER — Ambulatory Visit: Payer: Medicare HMO | Admitting: Speech Pathology

## 2022-04-17 DIAGNOSIS — H903 Sensorineural hearing loss, bilateral: Secondary | ICD-10-CM | POA: Diagnosis not present

## 2022-04-17 DIAGNOSIS — I6523 Occlusion and stenosis of bilateral carotid arteries: Secondary | ICD-10-CM

## 2022-04-17 DIAGNOSIS — I63512 Cerebral infarction due to unspecified occlusion or stenosis of left middle cerebral artery: Secondary | ICD-10-CM

## 2022-04-17 DIAGNOSIS — R482 Apraxia: Secondary | ICD-10-CM | POA: Diagnosis not present

## 2022-04-17 DIAGNOSIS — R4701 Aphasia: Secondary | ICD-10-CM | POA: Diagnosis not present

## 2022-04-17 NOTE — Telephone Encounter (Signed)
Order placed

## 2022-04-17 NOTE — Telephone Encounter (Signed)
Received auth for the CT - called and spoke with py's wife. Gave # for radiology scheduling for appt. I advised to call back to [place] at [facility] to make an appt with Dr. Lucky Cowboy for CT results appt. Pt's wife acknowledged. Nothing further is needed at this time.

## 2022-04-17 NOTE — Telephone Encounter (Signed)
Paul Bass can you take care of the PA for this Ct please

## 2022-04-17 NOTE — Therapy (Signed)
OUTPATIENT SPEECH LANGUAGE PATHOLOGY TREATMENT NOTE   Patient Name: Paul Bass MRN: [id number] DOB:1945/10/22, 76 y.o., male Today's Date: 04/17/2022  PCP: [name], NP REFERRING PROVIDER: Jennings Books, MD  END OF SESSION:   End of Session - 04/17/22 0909     Visit Number 5    Number of Visits 17    Date for SLP Re-Evaluation 05/25/22    Authorization Type Human Medicare HMO    Authorization Time Period 03/30/2022 thru    Authorization - Visit Number 5             Past Medical History:  Diagnosis Date   Accelerated junctional rhythm    Anemia    Anxiety    a.) Tx'd with BZO PRN   Arthritis    Bilateral carotid artery disease ([name])    BPH (benign prostatic hypertrophy)    CAD S/P percutaneous coronary angioplasty    a.) PCI in 2002 placing a RCA stent (unknown type). b.) LHC 12/07/2015: 5% ISR m-dRCA, 90% dRCA, 25% pRCA, 25% p-mLCx, 100% OM3, 95% oOM2-OM2, 75% m-dLAD; refer to CVTS. c.) 3v CABG 01/03/2016   CHF (congestive heart failure) ([place])    a.) TTE 05/12/2012: EF 50-55%; mild LA enlargement; G1DD. b.)  TTE 11/26/2015: EF 45%; mild BAE; triv PR, mild MR/TR; inferolateral HK; G1DD. c.)  TTE 11/07/2017: EF 35%; RV enlargement; BAE; inferior and inferolateral HK; triv PR; mild MR/TR. d.)  TTE 09/17/2019: EF 35%; mild LVH; triv MR/TR. e.)  TTE 08/11/2021: EF 50%; triv MR/TR; G1DD.   Cognitive deficit following cerebrovascular accident (CVA)    COPD (chronic obstructive pulmonary disease) ([name])    Cortical cataract    Depression    Dyspnea    GERD (gastroesophageal reflux disease)    History of 2019 novel coronavirus disease (COVID-19) 01/19/2021   History of acute inferior wall MI 2002   a.) PCI was performed placing a RCA stent (unknown type)   History of kidney stones    HTN (hypertension)    Hyperlipidemia    Hypothyroidism    Long term current use of antithrombotics/antiplatelets    a.) DAPT therapy (ASA + clopidogrel)   OSA (obstructive sleep  apnea)    a.) does not require nocturnal PAP therapy   PLMD (periodic limb movement disorder)    Postoperative atrial fibrillation ([place]) 01/03/2016   a.) following CABG procedure   Prostatitis    PVD (peripheral vascular disease) ([place])    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 b/) residual RIGHT sided weakness   T2DM (type 2 diabetes mellitus) ([place])    Tobacco abuse    Past Surgical History:  Procedure Laterality Date   CARDIAC CATHETERIZATION N/A 12/07/2015   Procedure: Left Heart Cath and Coronary Angiography;  Surgeon: Corey Skains, MD;  Location: [place] [facility];  Service: Cardiovascular;  Laterality: N/A;   CATARACT EXTRACTION W/PHACO Left 03/24/2021   Procedure: CATARACT EXTRACTION PHACO AND INTRAOCULAR LENS PLACEMENT (IOC) LEFT DIABETIC 6.67 00:42.5;  Surgeon: Birder Robson, MD;  Location: [facility] ORS;  Service: Ophthalmology;  Laterality: Left;   COLONOSCOPY WITH PROPOFOL N/A 02/18/2019   Procedure: COLONOSCOPY WITH PROPOFOL;  Surgeon: Lucilla Lame, MD;  Location: [facility] ENDOSCOPY;  Service: Endoscopy;  Laterality: N/A;   COLONOSCOPY WITH  PROPOFOL N/A 12/22/2021   Procedure: COLONOSCOPY WITH PROPOFOL;  Surgeon: Lucilla Lame, MD;  Location: [facility] ENDOSCOPY;  Service: Endoscopy;  Laterality: N/A;   CORONARY ANGIOPLASTY WITH STENT PLACEMENT Left 2002   CORONARY ARTERY BYPASS GRAFT N/A 01/03/2016   Procedure: 3v CORONARY ARTERY BYPASS GRAFT (LIMA-LAD, SVG-PDA, SVG-OM2); Location: [facility]; Surgeon: Dwaine Deter, MD   CYSTOSCOPY W/ URETERAL STENT REMOVAL N/A 10/04/2021   Procedure: CYSTOSCOPY WITH PROSTATE FOREIGN BODY REMOVAL;  Surgeon: Abbie Sons, MD;  Location: [facility];  Service: Urology;  Laterality: N/A;   CYSTOSCOPY WITH INSERTION OF  UROLIFT N/A 04/29/2019   Procedure: CYSTOSCOPY WITH INSERTION OF UROLIFT;  Surgeon: Abbie Sons, MD;  Location: [facility];  Service: Urology;  Laterality: N/A;   ESOPHAGOGASTRODUODENOSCOPY (EGD) WITH PROPOFOL N/A 09/11/2016   Procedure: ESOPHAGOGASTRODUODENOSCOPY (EGD) WITH PROPOFOL;  Surgeon: Lucilla Lame, MD;  Location: [facility] ENDOSCOPY;  Service: Endoscopy;  Laterality: N/A;   ESOPHAGOGASTRODUODENOSCOPY (EGD) WITH PROPOFOL N/A 10/17/2016   Procedure: ESOPHAGOGASTRODUODENOSCOPY (EGD) WITH PROPOFOL;  Surgeon: Lucilla Lame, MD;  Location: [facility] ENDOSCOPY;  Service: Endoscopy;  Laterality: N/A;   ESOPHAGOGASTRODUODENOSCOPY (EGD) WITH PROPOFOL N/A 11/13/2017   Procedure: ESOPHAGOGASTRODUODENOSCOPY (EGD) WITH PROPOFOL;  Surgeon: Lucilla Lame, MD;  Location: [facility] ENDOSCOPY;  Service: Endoscopy;  Laterality: N/A;   pins R lower leg Left    rotator cuff replaced Right    SAVORY DILATION N/A 10/17/2016   Procedure: SAVORY DILATION;  Surgeon: Lucilla Lame, MD;  Location: [facility] ENDOSCOPY;  Service: Endoscopy;  Laterality: N/A;   TRANSURETHRAL INCISION OF PROSTATE N/A 10/04/2021   Procedure: TRANSURETHRAL INCISION OF THE PROSTATE (TUIP);  Surgeon: Abbie Sons, MD;  Location: [facility];  Service: Urology;  Laterality: N/A;   Patient Active Problem List   Diagnosis Date Noted   History of colonic polyps    Anxiety state 11/17/2021   Cognitive deficit, post-stroke 11/17/2021   Ex-smoker 11/17/2021   Bradycardia 58/03/9832   Chronic systolic CHF (congestive heart failure), NYHA class 2 ([place]) 05/27/2020   Accelerated junctional rhythm 04/21/2019   Encounter for screening colonoscopy    Polyp of colon    Atherosclerotic heart disease of native coronary artery without angina pectoris 01/14/2019   Depression 01/14/2019   GERD (gastroesophageal reflux disease) 01/14/2019   Periodic limb movement disorder (PLMD) 01/14/2019   Sleep apnea 01/14/2019   Type 2 diabetes mellitus without complications ([place])  82/50/5397   Stricture and stenosis of esophagus    Problems with swallowing and mastication    Food impaction of esophagus    H/O coronary artery bypass surgery 01/17/2016   Cerebrovascular accident (CVA) due to embolism ([place]) 01/03/2016   Hypothyroidism, unspecified 01/03/2016   HLD (hyperlipidemia) 01/03/2016   Stable angina ([place]) 11/29/2015   Benign essential hypertension 05/27/2015   Bilateral carotid artery stenosis 02/24/2015   Atherosclerotic peripheral vascular disease ([place]) 09/07/2014   Bladder calculus 08/12/2014   Primary osteoarthritis of left knee 03/10/2014   Elevated prostate specific antigen (PSA) 02/05/2013   Snoring 01/22/2013   Sleepiness 01/22/2013   Dysphagia, unspecified(787.20) 01/22/2013   Occlusion and stenosis of carotid artery without mention of cerebral infarction 01/22/2013   Cerebral thrombosis with cerebral infarction ([place]) 01/22/2013   Benign prostatic hyperplasia with incomplete bladder emptying 08/26/2012   Chronic prostatitis 08/26/2012   Family history of malignant neoplasm of prostate 08/26/2012   Incomplete emptying of bladder 08/26/2012   Dysphagia 05/14/2012   Global aphasia 05/14/2012   Stroke, acute, thrombotic ([place]) 05/12/2012   Symptomatic carotid artery stenosis 05/12/2012   HTN (  hypertension) 05/12/2012   COPD (chronic obstructive pulmonary disease) ([place]) 05/12/2012   Acute respiratory failure ([place]) 05/12/2012    ONSET DATE: 05/13/2012  REFERRING DIAG: D22.025 (ICD-10-CM) - Cognitive deficit, post-stroke   PERTINENT HISTORY: Pt is a 76 year old male with past medical hx of anxiety, COP, depression, diabetes mellitus without complication, HTN, hyperlipidemia.    Of note, pt received Outpatient ST services for a total of 28 sessions ending on 03/24/2020. At the end of that POC pt required moderate assistance.      DIAGNOSTIC FINDINGS:  MRI 10/03/2019  No acute intracranial abnormality. 2. Moderate-sized chronic left MCA  infarct. 3. Mild chronic small vessel ischemic disease, mildly progressed from 2014. 4. Progressive cerebral atrophy.  THERAPY DIAG:  Aphasia  Left middle cerebral artery stroke [facility])  Rationale for Evaluation and Treatment Rehabilitation  SUBJECTIVE: pt interested in trailing a SGD  Pt accompanied by: self  PAIN:  Are you having pain? No  PATIENT GOALS: he would like to be able to communicate better   OBJECTIVE:   TODAY'S TREATMENT: Skilled treatment session targeted pt's word finding and communication breakdown goals. SLP facilitated session by providing the following interventions:   SLP provided written information from [place] regarding information on insurance coverage as well as providing skilled assistance to complete financial assistance form.     PATIENT EDUCATION: Education details: SGD Person educated: Patient and wife (via text) Education method: Explanation and Handouts Education comprehension: verbalized understanding  GOALS: Goals reviewed with patient? Yes   SHORT TERM GOALS: Target date: 10 sessions   Pt will learn strategies (such as drawing, gestures) to aid during communication breakdowns.  Baseline: only using circumlocutions Goal status: INITIAL   2.  With minimal assistance, pt will use strategies to help during communication breakdowns.  Baseline: new goal Goal status: INITIAL     LONG TERM GOALS: Target date: 05/25/2022   Pt will utilize strategies to aid in communication breakdowns with Mod I.  Baseline: only using circumlocutions Goal status: INITIAL  ASSESSMENT:  CLINICAL IMPRESSION: Pt presents with moderate to severe expressive language deficits. Pt continues to be a good candidate for SGD as he is able to read and comprehend spoken information. Pt with great awareness of errors.   OBJECTIVE IMPAIRMENTS include expressive language and aphasia. These impairments are limiting patient from effectively communicating at home and  in community. Factors affecting potential to achieve goals and functional outcome are  time post onset (10 years) and previous ST intervention . Patient will benefit from skilled SLP services to address above impairments and improve overall function.  REHAB POTENTIAL: Fair time post onset (10 years)  PLAN: SLP FREQUENCY: 1-2x/week  SLP DURATION: 8 weeks  PLANNED INTERVENTIONS: Language facilitation, Internal/external aids, SLP instruction and feedback, Compensatory strategies, and Patient/family education   Lilliane Sposito B. Rutherford Nail, M.S., CCC-SLP, Mining engineer Certified Brain Injury [place]  [place] Office [phone] Ascom [phone] Fax [phone]

## 2022-04-19 ENCOUNTER — Ambulatory Visit: Payer: Medicare HMO | Admitting: Speech Pathology

## 2022-04-24 ENCOUNTER — Ambulatory Visit: Payer: Medicare HMO | Admitting: Speech Pathology

## 2022-04-24 DIAGNOSIS — I63512 Cerebral infarction due to unspecified occlusion or stenosis of left middle cerebral artery: Secondary | ICD-10-CM | POA: Diagnosis not present

## 2022-04-24 DIAGNOSIS — R4701 Aphasia: Secondary | ICD-10-CM | POA: Diagnosis not present

## 2022-04-24 DIAGNOSIS — R482 Apraxia: Secondary | ICD-10-CM | POA: Diagnosis not present

## 2022-04-24 NOTE — Therapy (Signed)
OUTPATIENT SPEECH LANGUAGE PATHOLOGY TREATMENT NOTE   Patient Name: Paul Bass MRN: [id number] DOB:03/10/1946, 76 y.o., male Today's Date: 04/24/2022  PCP: [name], NP REFERRING PROVIDER: Jennings Books, MD  END OF SESSION:   End of Session - 04/24/22 0901     Visit Number 6    Number of Visits 17    Date for SLP Re-Evaluation 05/25/22    Authorization Type Human Medicare HMO    Authorization - Visit Number 6    Authorization - Number of Visits 10    Progress Note Due on Visit 10    SLP Start Time 0900    SLP Stop Time  1000    SLP Time Calculation (min) 60 min    Activity Tolerance Patient tolerated treatment well             Past Medical History:  Diagnosis Date   Accelerated junctional rhythm    Anemia    Anxiety    a.) Tx'd with BZO PRN   Arthritis    Bilateral carotid artery disease ([place])    BPH (benign prostatic hypertrophy)    CAD S/P percutaneous coronary angioplasty    a.) PCI in 2002 placing a RCA stent (unknown type). b.) LHC 12/07/2015: 5% ISR m-dRCA, 90% dRCA, 25% pRCA, 25% p-mLCx, 100% OM3, 95% oOM2-OM2, 75% m-dLAD; refer to CVTS. c.) 3v CABG 01/03/2016   CHF (congestive heart failure) ([place])    a.) TTE 05/12/2012: EF 50-55%; mild LA enlargement; G1DD. b.)  TTE 11/26/2015: EF 45%; mild BAE; triv PR, mild MR/TR; inferolateral HK; G1DD. c.)  TTE 11/07/2017: EF 35%; RV enlargement; BAE; inferior and inferolateral HK; triv PR; mild MR/TR. d.)  TTE 09/17/2019: EF 35%; mild LVH; triv MR/TR. e.)  TTE 08/11/2021: EF 50%; triv MR/TR; G1DD.   Cognitive deficit following cerebrovascular accident (CVA)    COPD (chronic obstructive pulmonary disease) ([name])    Cortical cataract    Depression    Dyspnea    GERD (gastroesophageal reflux disease)    History of 2019 novel coronavirus disease (COVID-19) 01/19/2021   History of acute inferior wall MI 2002   a.) PCI was performed placing a RCA stent (unknown type)   History of kidney stones    HTN  (hypertension)    Hyperlipidemia    Hypothyroidism    Long term current use of antithrombotics/antiplatelets    a.) DAPT therapy (ASA + clopidogrel)   OSA (obstructive sleep apnea)    a.) does not require nocturnal PAP therapy   PLMD (periodic limb movement disorder)    Postoperative atrial fibrillation ([place]) 01/03/2016   a.) following CABG procedure   Prostatitis    PVD (peripheral vascular disease) ([place])    S/P CABG x 3 01/03/2016   a.) LIMA-LAD, SVG-PDA, SVG-OM2   Stroke ([place]) 04/2012   a.) LEFT M1 occlusion from possible moderate LEFT ICA stenosis; Tx with TPA + mechanical embolectomy with Trevo Provue retrieval device with full recanalization and proximal LEFT ICA rescue stent. b/) residual RIGHT sided weakness   T2DM (type 2 diabetes mellitus) ([place])    Tobacco abuse    Past Surgical History:  Procedure Laterality Date   CARDIAC CATHETERIZATION N/A 12/07/2015   Procedure: Left Heart Cath and Coronary Angiography;  Surgeon: Corey Skains, MD;  Location: [place] [facility];  Service: Cardiovascular;  Laterality: N/A;   CATARACT EXTRACTION W/PHACO Left 03/24/2021   Procedure: CATARACT EXTRACTION PHACO AND INTRAOCULAR LENS PLACEMENT (IOC) LEFT DIABETIC 6.67 00:42.5;  Surgeon: Birder Robson,  MD;  Location: [facility];  Service: Ophthalmology;  Laterality: Left;   COLONOSCOPY WITH PROPOFOL N/A 02/18/2019   Procedure: COLONOSCOPY WITH PROPOFOL;  Surgeon: Lucilla Lame, MD;  Location: [facility] ENDOSCOPY;  Service: Endoscopy;  Laterality: N/A;   COLONOSCOPY WITH PROPOFOL N/A 12/22/2021   Procedure: COLONOSCOPY WITH PROPOFOL;  Surgeon: Lucilla Lame, MD;  Location: [facility] ENDOSCOPY;  Service: Endoscopy;  Laterality: N/A;   CORONARY ANGIOPLASTY WITH STENT PLACEMENT Left 2002   CORONARY ARTERY BYPASS GRAFT N/A 01/03/2016   Procedure: 3v CORONARY ARTERY BYPASS GRAFT (LIMA-LAD, SVG-PDA, SVG-OM2); Location: [facility]; Surgeon: Dwaine Deter, MD   CYSTOSCOPY W/ URETERAL STENT REMOVAL N/A 10/04/2021    Procedure: CYSTOSCOPY WITH PROSTATE FOREIGN BODY REMOVAL;  Surgeon: Abbie Sons, MD;  Location: [facility];  Service: Urology;  Laterality: N/A;   CYSTOSCOPY WITH INSERTION OF UROLIFT N/A 04/29/2019   Procedure: CYSTOSCOPY WITH INSERTION OF UROLIFT;  Surgeon: Abbie Sons, MD;  Location: [facility];  Service: Urology;  Laterality: N/A;   ESOPHAGOGASTRODUODENOSCOPY (EGD) WITH PROPOFOL N/A 09/11/2016   Procedure: ESOPHAGOGASTRODUODENOSCOPY (EGD) WITH PROPOFOL;  Surgeon: Lucilla Lame, MD;  Location: [facility] ENDOSCOPY;  Service: Endoscopy;  Laterality: N/A;   ESOPHAGOGASTRODUODENOSCOPY (EGD) WITH PROPOFOL N/A 10/17/2016   Procedure: ESOPHAGOGASTRODUODENOSCOPY (EGD) WITH PROPOFOL;  Surgeon: Lucilla Lame, MD;  Location: [facility] ENDOSCOPY;  Service: Endoscopy;  Laterality: N/A;   ESOPHAGOGASTRODUODENOSCOPY (EGD) WITH PROPOFOL N/A 11/13/2017   Procedure: ESOPHAGOGASTRODUODENOSCOPY (EGD) WITH PROPOFOL;  Surgeon: Lucilla Lame, MD;  Location: [facility] ENDOSCOPY;  Service: Endoscopy;  Laterality: N/A;   pins R lower leg Left    rotator cuff replaced Right    SAVORY DILATION N/A 10/17/2016   Procedure: SAVORY DILATION;  Surgeon: Lucilla Lame, MD;  Location: [facility] ENDOSCOPY;  Service: Endoscopy;  Laterality: N/A;   TRANSURETHRAL INCISION OF PROSTATE N/A 10/04/2021   Procedure: TRANSURETHRAL INCISION OF THE PROSTATE (TUIP);  Surgeon: Abbie Sons, MD;  Location: [facility];  Service: Urology;  Laterality: N/A;   Patient Active Problem List   Diagnosis Date Noted   History of colonic polyps    Anxiety state 11/17/2021   Cognitive deficit, post-stroke 11/17/2021   Ex-smoker 11/17/2021   Bradycardia 78/46/9629   Chronic systolic CHF (congestive heart failure), NYHA class 2 ([place]) 05/27/2020   Accelerated junctional rhythm 04/21/2019   Encounter for screening colonoscopy    Polyp of colon    Atherosclerotic heart disease of native coronary artery without angina pectoris 01/14/2019   Depression 01/14/2019   GERD  (gastroesophageal reflux disease) 01/14/2019   Periodic limb movement disorder (PLMD) 01/14/2019   Sleep apnea 01/14/2019   Type 2 diabetes mellitus without complications ([place]) 52/84/1324   Stricture and stenosis of esophagus    Problems with swallowing and mastication    Food impaction of esophagus    H/O coronary artery bypass surgery 01/17/2016   Cerebrovascular accident (CVA) due to embolism ([place]) 01/03/2016   Hypothyroidism, unspecified 01/03/2016   HLD (hyperlipidemia) 01/03/2016   Stable angina ([place]) 11/29/2015   Benign essential hypertension 05/27/2015   Bilateral carotid artery stenosis 02/24/2015   Atherosclerotic peripheral vascular disease ([place]) 09/07/2014   Bladder calculus 08/12/2014   Primary osteoarthritis of left knee 03/10/2014   Elevated prostate specific antigen (PSA) 02/05/2013   Snoring 01/22/2013   Sleepiness 01/22/2013   Dysphagia, unspecified(787.20) 01/22/2013   Occlusion and stenosis of carotid artery without mention of cerebral infarction 01/22/2013   Cerebral thrombosis with cerebral infarction ([place]) 01/22/2013   Benign prostatic hyperplasia with incomplete bladder emptying 08/26/2012   Chronic prostatitis 08/26/2012  Family history of malignant neoplasm of prostate 08/26/2012   Incomplete emptying of bladder 08/26/2012   Dysphagia 05/14/2012   Global aphasia 05/14/2012   Stroke, acute, thrombotic ([place]) 05/12/2012   Symptomatic carotid artery stenosis 05/12/2012   HTN (hypertension) 05/12/2012   COPD (chronic obstructive pulmonary disease) ([place]) 05/12/2012   Acute respiratory failure ([place]) 05/12/2012    ONSET DATE: 05/13/2012  REFERRING DIAG: W73.710 (ICD-10-CM) - Cognitive deficit, post-stroke   PERTINENT HISTORY: Pt is a 76 year old male with past medical hx of anxiety, COP, depression, diabetes mellitus without complication, HTN, hyperlipidemia.    Of note, pt received Outpatient ST services for a total of 28 sessions ending on 03/24/2020. At  the end of that POC pt required moderate assistance.      DIAGNOSTIC FINDINGS:  MRI 10/03/2019  No acute intracranial abnormality. 2. Moderate-sized chronic left MCA infarct. 3. Mild chronic small vessel ischemic disease, mildly progressed from 2014. 4. Progressive cerebral atrophy.  THERAPY DIAG:  Aphasia  Left middle cerebral artery stroke [facility])  Rationale for Evaluation and Treatment Rehabilitation  SUBJECTIVE: pt interested in trailing a SGD  Pt accompanied by: self  PAIN:  Are you having pain? No  PATIENT GOALS: he would like to be able to communicate better   OBJECTIVE:   TODAY'S TREATMENT: Skilled treatment session targeted pt's word finding and communication breakdown goals. SLP facilitated session by providing the following interventions:   Pt's trial SGD arrived from [place].  SLP consulted with pt to edit the following communicative icons: Grocery Store - deleted out dated store and added the following  [place] - deleted irrelevant stores and added:  [place] Office  Added - I need a book of stamps; I would like to mail this Drug Store:   Added - I need to refill (with icons of his prescription bottles added)        PATIENT EDUCATION: Education details: SGD Person educated: Patient and wife (via text) Education method: Explanation and Handouts Education comprehension: verbalized understanding  GOALS: Goals reviewed with patient? Yes   SHORT TERM GOALS: Target date: 10 sessions   Pt will learn strategies (such as drawing, gestures) to aid during communication breakdowns.  Baseline: only using circumlocutions Goal status: INITIAL   2.  With minimal assistance, pt will use strategies to help during communication breakdowns.  Baseline: new goal Goal status: INITIAL     LONG TERM GOALS: Target date: 05/25/2022   Pt will utilize strategies to aid in communication  breakdowns with Mod I.  Baseline: only using circumlocutions Goal status: INITIAL  ASSESSMENT:  CLINICAL IMPRESSION: Pt able to demonstrate basic navigational abilities with new SGD. In addition, the icons increased his verbal language use as he demonstrated less word finding deficits. Pt continues to be an excellent candidate for SGD use.   OBJECTIVE IMPAIRMENTS include expressive language and aphasia. These impairments are limiting patient from effectively communicating at home and in community. Factors affecting potential to achieve goals and functional outcome are  time post onset (10 years) and previous ST intervention . Patient will benefit from skilled SLP services to address above impairments and improve overall function.  REHAB POTENTIAL: Fair time post onset (10 years)  PLAN: SLP FREQUENCY: 1-2x/week  SLP DURATION: 8 weeks  PLANNED INTERVENTIONS: Language facilitation, Internal/external aids, SLP instruction and feedback, Compensatory strategies, and Patient/family education   Mylei Brackeen B. Rutherford Nail, M.S., [place], [place] Pathologist Certified Brain Injury  [facility] Rehabilitation Services Office [phone] Ascom [phone] Fax [phone]

## 2022-04-26 ENCOUNTER — Ambulatory Visit: Payer: Medicare HMO | Admitting: Speech Pathology

## 2022-04-27 ENCOUNTER — Ambulatory Visit
Admission: RE | Admit: 2022-04-27 | Discharge: 2022-04-27 | Disposition: A | Discharge status: Home / Self Care | Payer: Medicare HMO | Source: Ambulatory Visit | Attending: Nurse Practitioner | Admitting: Nurse Practitioner

## 2022-04-27 DIAGNOSIS — I6523 Occlusion and stenosis of bilateral carotid arteries: Secondary | ICD-10-CM | POA: Insufficient documentation

## 2022-04-27 DIAGNOSIS — I672 Cerebral atherosclerosis: Secondary | ICD-10-CM | POA: Diagnosis not present

## 2022-04-27 DIAGNOSIS — Z8673 Personal history of transient ischemic attack (TIA), and cerebral infarction without residual deficits: Secondary | ICD-10-CM | POA: Diagnosis not present

## 2022-04-27 LAB — POCT I-STAT CREATININE: Creatinine, Ser: 1.1 mg/dL (ref 0.61–1.24)

## 2022-04-27 MED ORDER — IOHEXOL 350 MG/ML SOLN
75.0000 mL | Freq: Once | INTRAVENOUS | Status: AC | PRN
  Administered 2022-04-27: 75 mL via INTRAVENOUS

## 2022-05-02 ENCOUNTER — Ambulatory Visit: Payer: Medicare HMO | Attending: Neurology | Admitting: Speech Pathology

## 2022-05-02 DIAGNOSIS — R4701 Aphasia: Secondary | ICD-10-CM | POA: Insufficient documentation

## 2022-05-02 DIAGNOSIS — R41841 Cognitive communication deficit: Secondary | ICD-10-CM | POA: Insufficient documentation

## 2022-05-02 DIAGNOSIS — I63512 Cerebral infarction due to unspecified occlusion or stenosis of left middle cerebral artery: Secondary | ICD-10-CM | POA: Insufficient documentation

## 2022-05-02 DIAGNOSIS — R471 Dysarthria and anarthria: Secondary | ICD-10-CM | POA: Diagnosis not present

## 2022-05-02 NOTE — Therapy (Signed)
OUTPATIENT SPEECH LANGUAGE PATHOLOGY TREATMENT NOTE   Patient Name: Paul Bass MRN: [id number] DOB:Jan 20, 1946, 76 y.o., male Today's Date: 05/02/2022  PCP: [name], NP REFERRING PROVIDER: Jennings Books, MD  END OF SESSION:   End of Session - 05/02/22 1104     Visit Number 7    Number of Visits 17    Date for SLP Re-Evaluation 05/25/22    Authorization Type Human Medicare HMO    Authorization Time Period 03/30/2022 thru    Authorization - Visit Number 7    Authorization - Number of Visits 10    Progress Note Due on Visit 10    SLP Start Time 1100    SLP Stop Time  1200    SLP Time Calculation (min) 60 min    Activity Tolerance Patient tolerated treatment well             Past Medical History:  Diagnosis Date   Accelerated junctional rhythm    Anemia    Anxiety    a.) Tx'd with BZO PRN   Arthritis    Bilateral carotid artery disease ([name])    BPH (benign prostatic hypertrophy)    CAD S/P percutaneous coronary angioplasty    a.) PCI in 2002 placing a RCA stent (unknown type). b.) LHC 12/07/2015: 5% ISR m-dRCA, 90% dRCA, 25% pRCA, 25% p-mLCx, 100% OM3, 95% oOM2-OM2, 75% m-dLAD; refer to CVTS. c.) 3v CABG 01/03/2016   CHF (congestive heart failure) ([place])    a.) TTE 05/12/2012: EF 50-55%; mild LA enlargement; G1DD. b.)  TTE 11/26/2015: EF 45%; mild BAE; triv PR, mild MR/TR; inferolateral HK; G1DD. c.)  TTE 11/07/2017: EF 35%; RV enlargement; BAE; inferior and inferolateral HK; triv PR; mild MR/TR. d.)  TTE 09/17/2019: EF 35%; mild LVH; triv MR/TR. e.)  TTE 08/11/2021: EF 50%; triv MR/TR; G1DD.   Cognitive deficit following cerebrovascular accident (CVA)    COPD (chronic obstructive pulmonary disease) ([name])    Cortical cataract    Depression    Dyspnea    GERD (gastroesophageal reflux disease)    History of 2019 novel coronavirus disease (COVID-19) 01/19/2021   History of acute inferior wall MI 2002   a.) PCI was performed placing a RCA stent (unknown type)    History of kidney stones    HTN (hypertension)    Hyperlipidemia    Hypothyroidism    Long term current use of antithrombotics/antiplatelets    a.) DAPT therapy (ASA + clopidogrel)   OSA (obstructive sleep apnea)    a.) does not require nocturnal PAP therapy   PLMD (periodic limb movement disorder)    Postoperative atrial fibrillation ([place]) 01/03/2016   a.) following CABG procedure   Prostatitis    PVD (peripheral vascular disease) ([place])    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 b/) residual RIGHT sided weakness   T2DM (type 2 diabetes mellitus) ([place])    Tobacco abuse    Past Surgical History:  Procedure Laterality Date   CARDIAC CATHETERIZATION N/A 12/07/2015   Procedure: Left Heart Cath and Coronary Angiography;  Surgeon: Corey Skains, MD;  Location: [facility];  Service: Cardiovascular;  Laterality: N/A;   CATARACT EXTRACTION W/PHACO Left 03/24/2021   Procedure: CATARACT EXTRACTION PHACO AND INTRAOCULAR LENS PLACEMENT (IOC)  LEFT DIABETIC 6.67 00:42.5;  Surgeon: Birder Robson, MD;  Location: [facility];  Service: Ophthalmology;  Laterality: Left;   COLONOSCOPY WITH PROPOFOL N/A 02/18/2019   Procedure: COLONOSCOPY WITH PROPOFOL;  Surgeon: Lucilla Lame, MD;  Location: [facility] ENDOSCOPY;  Service: Endoscopy;  Laterality: N/A;   COLONOSCOPY WITH PROPOFOL N/A 12/22/2021   Procedure: COLONOSCOPY WITH PROPOFOL;  Surgeon: Lucilla Lame, MD;  Location: [facility] ENDOSCOPY;  Service: Endoscopy;  Laterality: N/A;   CORONARY ANGIOPLASTY WITH STENT PLACEMENT Left 2002   CORONARY ARTERY BYPASS GRAFT N/A 01/03/2016   Procedure: 3v CORONARY ARTERY BYPASS GRAFT (LIMA-LAD, SVG-PDA, SVG-OM2); Location: [facility]; Surgeon: Dwaine Deter, MD   CYSTOSCOPY W/  URETERAL STENT REMOVAL N/A 10/04/2021   Procedure: CYSTOSCOPY WITH PROSTATE FOREIGN BODY REMOVAL;  Surgeon: Abbie Sons, MD;  Location: [facility];  Service: Urology;  Laterality: N/A;   CYSTOSCOPY WITH INSERTION OF UROLIFT N/A 04/29/2019   Procedure: CYSTOSCOPY WITH INSERTION OF UROLIFT;  Surgeon: Abbie Sons, MD;  Location: [facility];  Service: Urology;  Laterality: N/A;   ESOPHAGOGASTRODUODENOSCOPY (EGD) WITH PROPOFOL N/A 09/11/2016   Procedure: ESOPHAGOGASTRODUODENOSCOPY (EGD) WITH PROPOFOL;  Surgeon: Lucilla Lame, MD;  Location: [facility]OSCOPY;  Service: Endoscopy;  Laterality: N/A;   ESOPHAGOGASTRODUODENOSCOPY (EGD) WITH PROPOFOL N/A 10/17/2016   Procedure: ESOPHAGOGASTRODUODENOSCOPY (EGD) WITH PROPOFOL;  Surgeon: Lucilla Lame, MD;  Location: [facility];  Service: Endoscopy;  Laterality: N/A;   ESOPHAGOGASTRODUODENOSCOPY (EGD) WITH PROPOFOL N/A 11/13/2017   Procedure: ESOPHAGOGASTRODUODENOSCOPY (EGD) WITH PROPOFOL;  Surgeon: Lucilla Lame, MD;  Location: [facility] ENDOSCOPY;  Service: Endoscopy;  Laterality: N/A;   pins R lower leg Left    rotator cuff replaced Right    SAVORY DILATION N/A 10/17/2016   Procedure: SAVORY DILATION;  Surgeon: Lucilla Lame, MD;  Location: [facility] ENDOSCOPY;  Service: Endoscopy;  Laterality: N/A;   TRANSURETHRAL INCISION OF PROSTATE N/A 10/04/2021   Procedure: TRANSURETHRAL INCISION OF THE PROSTATE (TUIP);  Surgeon: Abbie Sons, MD;  Location: [facility];  Service: Urology;  Laterality: N/A;   Patient Active Problem List   Diagnosis Date Noted   History of colonic polyps    Anxiety state 11/17/2021   Cognitive deficit, post-stroke 11/17/2021   Ex-smoker 11/17/2021   Bradycardia 27/25/3664   Chronic systolic CHF (congestive heart failure), NYHA class 2 ([place]) 05/27/2020   Accelerated junctional rhythm 04/21/2019   Encounter for screening colonoscopy    Polyp of colon    Atherosclerotic heart disease of native coronary artery without angina pectoris  01/14/2019   Depression 01/14/2019   GERD (gastroesophageal reflux disease) 01/14/2019   Periodic limb movement disorder (PLMD) 01/14/2019   Sleep apnea 01/14/2019   Type 2 diabetes mellitus without complications ([place]) 40/34/7425   Stricture and stenosis of esophagus    Problems with swallowing and mastication    Food impaction of esophagus    H/O coronary artery bypass surgery 01/17/2016   Cerebrovascular accident (CVA) due to embolism ([place]) 01/03/2016   Hypothyroidism, unspecified 01/03/2016   HLD (hyperlipidemia) 01/03/2016   Stable angina 11/29/2015   Benign essential hypertension 05/27/2015   Bilateral carotid artery stenosis 02/24/2015   Atherosclerotic peripheral vascular disease ([place]) 09/07/2014   Bladder calculus 08/12/2014   Primary osteoarthritis of left knee 03/10/2014   Elevated prostate specific antigen (PSA) 02/05/2013   Snoring 01/22/2013   Sleepiness 01/22/2013   Dysphagia, unspecified(787.20) 01/22/2013   Occlusion and stenosis of carotid artery without mention of cerebral infarction 01/22/2013   Cerebral thrombosis with cerebral infarction ([place]) 01/22/2013   Benign prostatic hyperplasia with incomplete bladder  emptying 08/26/2012   Chronic prostatitis 08/26/2012   Family history of malignant neoplasm of prostate 08/26/2012   Incomplete emptying of bladder 08/26/2012   Dysphagia 05/14/2012   Global aphasia 05/14/2012   Stroke, acute, thrombotic ([place]) 05/12/2012   Symptomatic carotid artery stenosis 05/12/2012   HTN (hypertension) 05/12/2012   COPD (chronic obstructive pulmonary disease) ([place]) 05/12/2012   Acute respiratory failure ([place]) 05/12/2012    ONSET DATE: 05/13/2012  REFERRING DIAG: N17.001 (ICD-10-CM) - Cognitive deficit, post-stroke   PERTINENT HISTORY: Pt is a 76 year old male with past medical hx of anxiety, COP, depression, diabetes mellitus without complication, HTN, hyperlipidemia.    Of note, pt received Outpatient ST services for a total  of 28 sessions ending on 03/24/2020. At the end of that POC pt required moderate assistance.      DIAGNOSTIC FINDINGS:  MRI 10/03/2019  No acute intracranial abnormality. 2. Moderate-sized chronic left MCA infarct. 3. Mild chronic small vessel ischemic disease, mildly progressed from 2014. 4. Progressive cerebral atrophy.  THERAPY DIAG:  Aphasia  Left middle cerebral artery stroke [facility])  Rationale for Evaluation and Treatment Rehabilitation  SUBJECTIVE: pt interested in trailing a SGD  Pt accompanied by: self  PAIN:  Are you having pain? No  PATIENT GOALS: he would like to be able to communicate better   OBJECTIVE:   TODAY'S TREATMENT: Skilled treatment session targeted pt's word finding and communication breakdown goals. SLP facilitated session by providing the following interventions:   SLP instructed pt in basic navigation functions on SGD and reviewed all of the newly added icons. Pt was Min A in using basic navigational icons.          PATIENT EDUCATION: Education details: SGD Person educated: Patient and wife (via text) Education method: Explanation and Handouts Education comprehension: verbalized understanding  GOALS: Goals reviewed with patient? Yes   SHORT TERM GOALS: Target date: 10 sessions   Pt will learn strategies (such as drawing, gestures) to aid during communication breakdowns.  Baseline: only using circumlocutions Goal status: INITIAL   2.  With minimal assistance, pt will use strategies to help during communication breakdowns.  Baseline: new goal Goal status: INITIAL     LONG TERM GOALS: Target date: 05/25/2022   Pt will utilize strategies to aid in communication breakdowns with Mod I.  Baseline: only using circumlocutions Goal status: INITIAL  ASSESSMENT:  CLINICAL IMPRESSION: Pt able to demonstrate basic navigational abilities with new SGD. In addition, the icons increased his verbal language use as he demonstrated less word  finding deficits. Pt continues to be an excellent candidate for SGD use.   OBJECTIVE IMPAIRMENTS include expressive language and aphasia. These impairments are limiting patient from effectively communicating at home and in community. Factors affecting potential to achieve goals and functional outcome are  time post onset (10 years) and previous ST intervention . Patient will benefit from skilled SLP services to address above impairments and improve overall function.  REHAB POTENTIAL: Fair time post onset (10 years)  PLAN: SLP FREQUENCY: 1-2x/week  SLP DURATION: 8 weeks  PLANNED INTERVENTIONS: Language facilitation, Internal/external aids, SLP instruction and feedback, Compensatory strategies, and Patient/family education   Layia [place] B. Rutherford Nail, M.S., CCC-SLP, Mining engineer Certified Brain Injury [place]  [place] Office [phone] Ascom [phone] Fax [phone]

## 2022-05-05 ENCOUNTER — Ambulatory Visit: Payer: Medicare HMO | Admitting: Speech Pathology

## 2022-05-05 DIAGNOSIS — R4701 Aphasia: Secondary | ICD-10-CM

## 2022-05-05 DIAGNOSIS — R471 Dysarthria and anarthria: Secondary | ICD-10-CM

## 2022-05-05 DIAGNOSIS — I63512 Cerebral infarction due to unspecified occlusion or stenosis of left middle cerebral artery: Secondary | ICD-10-CM | POA: Diagnosis not present

## 2022-05-05 DIAGNOSIS — R41841 Cognitive communication deficit: Secondary | ICD-10-CM

## 2022-05-05 NOTE — Therapy (Signed)
OUTPATIENT SPEECH LANGUAGE PATHOLOGY TREATMENT NOTE   Patient Name: Paul Bass MRN: [id number] DOB:October 16, 1945, 76 y.o., male Today's Date: 05/05/2022  PCP: [name], NP REFERRING PROVIDER: Jennings Books, MD  END OF SESSION:   End of Session - 05/05/22 1213     Visit Number 8    Number of Visits 17    Date for SLP Re-Evaluation 05/25/22    Authorization Type [employer] Medicare HMO    Authorization Time Period 03/30/2022 thru    Authorization - Visit Number 8    Authorization - Number of Visits 10    Progress Note Due on Visit 10    SLP Start Time 1120    SLP Stop Time  1205    SLP Time Calculation (min) 45 min    Activity Tolerance Patient tolerated treatment well             Past Medical History:  Diagnosis Date   Accelerated junctional rhythm    Anemia    Anxiety    a.) Tx'd with BZO PRN   Arthritis    Bilateral carotid artery disease ([name])    BPH (benign prostatic hypertrophy)    CAD S/P percutaneous coronary angioplasty    a.) PCI in 2002 placing a RCA stent (unknown type). b.) LHC 12/07/2015: 5% ISR m-dRCA, 90% dRCA, 25% pRCA, 25% p-mLCx, 100% OM3, 95% oOM2-OM2, 75% m-dLAD; refer to CVTS. c.) 3v CABG 01/03/2016   CHF (congestive heart failure) ([name])    a.) TTE 05/12/2012: EF 50-55%; mild LA enlargement; G1DD. b.)  TTE 11/26/2015: EF 45%; mild BAE; triv PR, mild MR/TR; inferolateral HK; G1DD. c.)  TTE 11/07/2017: EF 35%; RV enlargement; BAE; inferior and inferolateral HK; triv PR; mild MR/TR. d.)  TTE 09/17/2019: EF 35%; mild LVH; triv MR/TR. e.)  TTE 08/11/2021: EF 50%; triv MR/TR; G1DD.   Cognitive deficit following cerebrovascular accident (CVA)    COPD (chronic obstructive pulmonary disease) ([name])    Cortical cataract    Depression    Dyspnea    GERD (gastroesophageal reflux disease)    History of 2019 novel coronavirus disease (COVID-19) 01/19/2021   History of acute inferior wall MI 2002   a.) PCI was performed placing a RCA stent (unknown type)    History of kidney stones    HTN (hypertension)    Hyperlipidemia    Hypothyroidism    Long term current use of antithrombotics/antiplatelets    a.) DAPT therapy (ASA + clopidogrel)   OSA (obstructive sleep apnea)    a.) does not require nocturnal PAP therapy   PLMD (periodic limb movement disorder)    Postoperative atrial fibrillation ([place]) 01/03/2016   a.) following CABG procedure   Prostatitis    PVD (peripheral vascular disease) ([place])    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 b/) residual RIGHT sided weakness   T2DM (type 2 diabetes mellitus) ([place])    Tobacco abuse    Past Surgical History:  Procedure Laterality Date   CARDIAC CATHETERIZATION N/A 12/07/2015   Procedure: Left Heart Cath and Coronary Angiography;  Surgeon: Corey Skains, MD;  Location: [facility];  Service: Cardiovascular;  Laterality: N/A;   CATARACT EXTRACTION W/PHACO Left 03/24/2021   Procedure: CATARACT EXTRACTION PHACO AND INTRAOCULAR LENS PLACEMENT (IOC)  LEFT DIABETIC 6.67 00:42.5;  Surgeon: Birder Robson, MD;  Location: [facility];  Service: Ophthalmology;  Laterality: Left;   COLONOSCOPY WITH PROPOFOL N/A 02/18/2019   Procedure: COLONOSCOPY WITH PROPOFOL;  Surgeon: Lucilla Lame, MD;  Location: [facility] ENDOSCOPY;  Service: Endoscopy;  Laterality: N/A;   COLONOSCOPY WITH PROPOFOL N/A 12/22/2021   Procedure: COLONOSCOPY WITH PROPOFOL;  Surgeon: Lucilla Lame, MD;  Location: [facility] ENDOSCOPY;  Service: Endoscopy;  Laterality: N/A;   CORONARY ANGIOPLASTY WITH STENT PLACEMENT Left 2002   CORONARY ARTERY BYPASS GRAFT N/A 01/03/2016   Procedure: 3v CORONARY ARTERY BYPASS GRAFT (LIMA-LAD, SVG-PDA, SVG-OM2); Location: [facility]; Surgeon: Dwaine Deter, MD   CYSTOSCOPY W/  URETERAL STENT REMOVAL N/A 10/04/2021   Procedure: CYSTOSCOPY WITH PROSTATE FOREIGN BODY REMOVAL;  Surgeon: Abbie Sons, MD;  Location: [facility];  Service: Urology;  Laterality: N/A;   CYSTOSCOPY WITH INSERTION OF UROLIFT N/A 04/29/2019   Procedure: CYSTOSCOPY WITH INSERTION OF UROLIFT;  Surgeon: Abbie Sons, MD;  Location: [facility];  Service: Urology;  Laterality: N/A;   ESOPHAGOGASTRODUODENOSCOPY (EGD) WITH PROPOFOL N/A 09/11/2016   Procedure: ESOPHAGOGASTRODUODENOSCOPY (EGD) WITH PROPOFOL;  Surgeon: Lucilla Lame, MD;  Location: [facility] ENDOSCOPY;  Service: Endoscopy;  Laterality: N/A;   ESOPHAGOGASTRODUODENOSCOPY (EGD) WITH PROPOFOL N/A 10/17/2016   Procedure: ESOPHAGOGASTRODUODENOSCOPY (EGD) WITH PROPOFOL;  Surgeon: Lucilla Lame, MD;  Location: [facility];  Service: Endoscopy;  Laterality: N/A;   ESOPHAGOGASTRODUODENOSCOPY (EGD) WITH PROPOFOL N/A 11/13/2017   Procedure: ESOPHAGOGASTRODUODENOSCOPY (EGD) WITH PROPOFOL;  Surgeon: Lucilla Lame, MD;  Location: [facility];  Service: Endoscopy;  Laterality: N/A;   pins R lower leg Left    rotator cuff replaced Right    SAVORY DILATION N/A 10/17/2016   Procedure: SAVORY DILATION;  Surgeon: Lucilla Lame, MD;  Location: [facility] ENDOSCOPY;  Service: Endoscopy;  Laterality: N/A;   TRANSURETHRAL INCISION OF PROSTATE N/A 10/04/2021   Procedure: TRANSURETHRAL INCISION OF THE PROSTATE (TUIP);  Surgeon: Abbie Sons, MD;  Location: [facility];  Service: Urology;  Laterality: N/A;   Patient Active Problem List   Diagnosis Date Noted   History of colonic polyps    Anxiety state 11/17/2021   Cognitive deficit, post-stroke 11/17/2021   Ex-smoker 11/17/2021   Bradycardia 93/26/7124   Chronic systolic CHF (congestive heart failure), NYHA class 2 ([place]) 05/27/2020   Accelerated junctional rhythm 04/21/2019   Encounter for screening colonoscopy    Polyp of colon    Atherosclerotic heart disease of native coronary artery without angina pectoris  01/14/2019   Depression 01/14/2019   GERD (gastroesophageal reflux disease) 01/14/2019   Periodic limb movement disorder (PLMD) 01/14/2019   Sleep apnea 01/14/2019   Type 2 diabetes mellitus without complications ([place]) 58/03/9832   Stricture and stenosis of esophagus    Problems with swallowing and mastication    Food impaction of esophagus    H/O coronary artery bypass surgery 01/17/2016   Cerebrovascular accident (CVA) due to embolism ([place]) 01/03/2016   Hypothyroidism, unspecified 01/03/2016   HLD (hyperlipidemia) 01/03/2016   Stable angina 11/29/2015   Benign essential hypertension 05/27/2015   Bilateral carotid artery stenosis 02/24/2015   Atherosclerotic peripheral vascular disease ([place]) 09/07/2014   Bladder calculus 08/12/2014   Primary osteoarthritis of left knee 03/10/2014   Elevated prostate specific antigen (PSA) 02/05/2013   Snoring 01/22/2013   Sleepiness 01/22/2013   Dysphagia, unspecified(787.20) 01/22/2013   Occlusion and stenosis of carotid artery without mention of cerebral infarction 01/22/2013   Cerebral thrombosis with cerebral infarction ([place]) 01/22/2013   Benign prostatic hyperplasia with incomplete bladder  emptying 08/26/2012   Chronic prostatitis 08/26/2012   Family history of malignant neoplasm of prostate 08/26/2012   Incomplete emptying of bladder 08/26/2012   Dysphagia 05/14/2012   Global aphasia 05/14/2012   Stroke, acute, thrombotic ([place]) 05/12/2012   Symptomatic carotid artery stenosis 05/12/2012   HTN (hypertension) 05/12/2012   COPD (chronic obstructive pulmonary disease) ([place]) 05/12/2012   Acute respiratory failure ([place]) 05/12/2012    ONSET DATE: 05/13/2012   REFERRING DIAG: M57.846 (ICD-10-CM) - Cognitive deficit, post-stroke   PERTINENT HISTORY: Pt is a 76 year old male with past medical hx of anxiety, COP, depression, diabetes mellitus without complication, HTN, hyperlipidemia.    Of note, pt received Outpatient ST services for a total  of 28 sessions ending on 03/24/2020. At the end of that POC pt required moderate assistance.      DIAGNOSTIC FINDINGS:  MRI 10/03/2019  No acute intracranial abnormality. 2. Moderate-sized chronic left MCA infarct. 3. Mild chronic small vessel ischemic disease, mildly progressed from 2014. 4. Progressive cerebral atrophy.   THERAPY DIAG:  Aphasia   Left middle cerebral artery stroke [facility])   Rationale for Evaluation and Treatment Rehabilitation   SUBJECTIVE: pt interested in trialing a SGD   Pt accompanied by: self   PAIN:  Are you having pain? No   PATIENT GOALS: he would like to be able to communicate better               OBJECTIVE:    TODAY'S TREATMENT: Skilled treatment session targeted pt's word finding and communication breakdown goals. SLP facilitated session by providing the following interventions:             SLP continued training basic navigation functions on SGD and reviewed all subcategory icons. Pt was Min A in using basic navigational icons such as home and back though patient is a max assist at identifying categories and selection options. Utilized repetition of selection to increase patient scanning and selection. Patient did demonstrated good stimulabilty to semantic feature analysis when trialed in unstructured conversation which may also be a beneficial strategy to support communication success.                                            PATIENT EDUCATION: Education details: SGD Person educated: Patient and wife (via text) Education method: Explanation and Handouts Education comprehension: verbalized understanding   GOALS: Goals reviewed with patient? Yes   SHORT TERM GOALS: Target date: 10 sessions   Pt will learn strategies (such as drawing, gestures) to aid during communication breakdowns.  Baseline: only using circumlocutions Goal status: INITIAL   2.  With minimal assistance, pt will use strategies to help during communication breakdowns.   Baseline: new goal Goal status: INITIAL     LONG TERM GOALS: Target date: 05/25/2022   Pt will utilize strategies to aid in communication breakdowns with Mod I.  Baseline: only using circumlocutions Goal status: INITIAL   ASSESSMENT:   CLINICAL IMPRESSION: Pt presents with emerging navigational abilities with new SGD. In addition, the icons increased his verbal language use as he demonstrated less word finding deficits. Pt continues to be an excellent candidate for SGD use.    OBJECTIVE IMPAIRMENTS include expressive language and aphasia. These impairments are limiting patient from effectively communicating at home and in community. Factors affecting potential to achieve goals and functional outcome are  time post onset (10 years)  and previous ST intervention . Patient will benefit from skilled SLP services to address above impairments and improve overall function.   REHAB POTENTIAL: Fair time post onset (10 years)   PLAN: SLP FREQUENCY: 1-2x/week   SLP DURATION: 8 weeks   PLANNED INTERVENTIONS: Language facilitation, Internal/external aids, SLP instruction and feedback, Compensatory strategies, and Patient/family education   [place] L. Lianette Broussard, M.A. [place] [phone]  [place], [place] 05/05/2022, 12:14 PM  [place] [facility] SERVICES [address] [place], [place], [address] Phone: [phone]   Fax:  [phone]

## 2022-05-08 ENCOUNTER — Ambulatory Visit: Payer: Medicare HMO | Admitting: Speech Pathology

## 2022-05-08 DIAGNOSIS — R4701 Aphasia: Secondary | ICD-10-CM | POA: Diagnosis not present

## 2022-05-08 DIAGNOSIS — R471 Dysarthria and anarthria: Secondary | ICD-10-CM | POA: Diagnosis not present

## 2022-05-08 DIAGNOSIS — R41841 Cognitive communication deficit: Secondary | ICD-10-CM | POA: Diagnosis not present

## 2022-05-08 DIAGNOSIS — I63512 Cerebral infarction due to unspecified occlusion or stenosis of left middle cerebral artery: Secondary | ICD-10-CM | POA: Diagnosis not present

## 2022-05-08 NOTE — Therapy (Signed)
OUTPATIENT SPEECH LANGUAGE PATHOLOGY TREATMENT NOTE   Patient Name: Paul Bass MRN: [id number] DOB:[birth year], 76 y.o., male Today's Date: 05/08/2022  PCP: [name], NP REFERRING PROVIDER: Jennings Books, MD  END OF SESSION:   End of Session - 05/08/22 1010     Visit Number 9    Number of Visits 17    Date for SLP Re-Evaluation 05/25/22    Authorization Type [employer] H[employer]    Authorization Time Period 03/30/2022 thru    Authorization - Visit Number 9    Authorization - Number of Visits 10    Progress Note Due on Visit 10    SLP Start Time 0900    SLP Stop Time  1000    SLP Time Calculation (min) 60 min    Activity Tolerance Patient tolerated treatment well             Past Medical History:  Diagnosis Date   Accelerated junctional rhythm    Anemia    Anxiety    a.) Tx'd with BZO PRN   Arthritis    Bilateral carotid artery disease ([name])    BPH (benign prostatic hypertrophy)    CAD S/P percutaneous coronary angioplasty    a.) PCI in 2002 placing a RCA stent (unknown type). b.) LHC 12/07/2015: 5% ISR m-dRCA, 90% dRCA, 25% pRCA, 25% p-mLCx, 100% OM3, 95% oOM2-OM2, 75% m-dLAD; refer to CVTS. c.) 3v CABG 01/03/2016   CHF (congestive heart failure) ([place])    a.) TTE 05/12/2012: EF 50-55%; mild LA enlargement; G1DD. b.)  TTE 11/26/2015: EF 45%; mild BAE; triv PR, mild MR/TR; inferolateral HK; G1DD. c.)  TTE 11/07/2017: EF 35%; RV enlargement; BAE; inferior and inferolateral HK; triv PR; mild MR/TR. d.)  TTE 09/17/2019: EF 35%; mild LVH; triv MR/TR. e.)  TTE 08/11/2021: EF 50%; triv MR/TR; G1DD.   Cognitive deficit following cerebrovascular accident (CVA)    COPD (chronic obstructive pulmonary disease) ([name])    Cortical cataract    Depression    Dyspnea    GERD (gastroesophageal reflux disease)    History of 2019 novel coronavirus disease (COVID-19) 01/19/2021   History of acute inferior wall MI 2002   a.) PCI was performed placing a RCA stent (unknown type)    History of kidney stones    HTN (hypertension)    Hyperlipidemia    Hypothyroidism    Long term current use of antithrombotics/antiplatelets    a.) DAPT therapy (ASA + clopidogrel)   OSA (obstructive sleep apnea)    a.) does not require nocturnal PAP therapy   PLMD (periodic limb movement disorder)    Postoperative atrial fibrillation ([place]) 01/03/2016   a.) following CABG procedure   Prostatitis    PVD (peripheral vascular disease) ([place])    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 b/) residual RIGHT sided weakness   T2DM (type 2 diabetes mellitus) ([place])    Tobacco abuse    Past Surgical History:  Procedure Laterality Date   CARDIAC CATHETERIZATION N/A 12/07/2015   Procedure: Left Heart Cath and Coronary Angiography;  Surgeon: Corey Skains, MD;  Location: [facility];  Service: Cardiovascular;  Laterality: N/A;   CATARACT EXTRACTION W/PHACO Left 03/24/2021   Procedure: CATARACT EXTRACTION PHACO AND INTRAOCULAR LENS PLACEMENT (IOC)  LEFT DIABETIC 6.67 00:42.5;  Surgeon: Birder Robson, MD;  Location: [facility];  Service: Ophthalmology;  Laterality: Left;   COLONOSCOPY WITH PROPOFOL N/A 02/18/2019   Procedure: COLONOSCOPY WITH PROPOFOL;  Surgeon: Lucilla Lame, MD;  Location: [facility];  Service: Endoscopy;  Laterality: N/A;   COLONOSCOPY WITH PROPOFOL N/A 12/22/2021   Procedure: COLONOSCOPY WITH PROPOFOL;  Surgeon: Lucilla Lame, MD;  Location: [facility] END[facility]COPY;  Service: Endoscopy;  Laterality: N/A;   CORONARY ANGIOPLASTY WITH STENT PLACEMENT Left 2002   CORONARY ARTERY BYPASS GRAFT N/A 01/03/2016   Procedure: 3v CORONARY ARTERY BYPASS GRAFT (LIMA-LAD, SVG-PDA, SVG-OM2); Location: [facility]; Surgeon: Dwaine Deter, MD   CYSTOSCOPY W/  URETERAL STENT REMOVAL N/A 10/04/2021   Procedure: CYSTOSCOPY WITH PROSTATE FOREIGN BODY REMOVAL;  Surgeon: Abbie Sons, MD;  Location: [facility];  Service: Urology;  Laterality: N/A;   CYSTOSCOPY WITH INSERTION OF UROLIFT N/A 04/29/2019   Procedure: CYSTOSCOPY WITH INSERTION OF UROLIFT;  Surgeon: Abbie Sons, MD;  Location: [facility];  Service: Urology;  Laterality: N/A;   ESOPHAGOGASTRODUODENOSCOPY (EGD) WITH PROPOFOL N/A 09/11/2016   Procedure: ESOPHAGOGASTRODUODENOSCOPY (EGD) WITH PROPOFOL;  Surgeon: Lucilla Lame, MD;  Location: [facility];  Service: Endoscopy;  Laterality: N/A;   ESOPHAGOGASTRODUODENOSCOPY (EGD) WITH PROPOFOL N/A 10/17/2016   Procedure: ESOPHAGOGASTRODUODENOSCOPY (EGD) WITH PROPOFOL;  Surgeon: Lucilla Lame, MD;  Location: [facility];  Service: Endoscopy;  Laterality: N/A;   ESOPHAGOGASTRODUODENOSCOPY (EGD) WITH PROPOFOL N/A 11/13/2017   Procedure: ESOPHAGOGASTRODUODENOSCOPY (EGD) WITH PROPOFOL;  Surgeon: Lucilla Lame, MD;  Location: [facility];  Service: Endoscopy;  Laterality: N/A;   pins R lower leg Left    rotator cuff replaced Right    SAVORY DILATION N/A 10/17/2016   Procedure: SAVORY DILATION;  Surgeon: Lucilla Lame, MD;  Location: [facility];  Service: Endoscopy;  Laterality: N/A;   TRANSURETHRAL INCISION OF PROSTATE N/A 10/04/2021   Procedure: TRANSURETHRAL INCISION OF THE PROSTATE (TUIP);  Surgeon: Abbie Sons, MD;  Location: [facility];  Service: Urology;  Laterality: N/A;   Patient Active Problem List   Diagnosis Date Noted   History of colonic polyps    Anxiety state 11/17/2021   Cognitive deficit, post-stroke 11/17/2021   Ex-smoker 11/17/2021   Bradycardia 51/08/5850   Chronic systolic CHF (congestive heart failure), NYHA class 2 ([place]) 05/27/2020   Accelerated junctional rhythm 04/21/2019   Encounter for screening colonoscopy    Polyp of colon    Atherosclerotic heart disease of native coronary artery without angina pectoris  01/14/2019   Depression 01/14/2019   GERD (gastroesophageal reflux disease) 01/14/2019   Periodic limb movement disorder (PLMD) 01/14/2019   Sleep apnea 01/14/2019   Type 2 diabetes mellitus without complications ([place]) 77/82/4235   Stricture and stenosis of esophagus    Problems with swallowing and mastication    Food impaction of esophagus    H/O coronary artery bypass surgery 01/17/2016   Cerebrovascular accident (CVA) due to embolism ([place]) 01/03/2016   Hypothyroidism, unspecified 01/03/2016   HLD (hyperlipidemia) 01/03/2016   Stable angina 11/29/2015   Benign essential hypertension 05/27/2015   Bilateral carotid artery stenosis 02/24/2015   Atherosclerotic peripheral vascular disease ([place]) 09/07/2014   Bladder calculus 08/12/2014   Primary osteoarthritis of left knee 03/10/2014   Elevated prostate specific antigen (PSA) 02/05/2013   Snoring 01/22/2013   Sleepiness 01/22/2013   Dysphagia, unspecified(787.20) 01/22/2013   Occlusion and stenosis of carotid artery without mention of cerebral infarction 01/22/2013   Cerebral thrombosis with cerebral infarction ([place]) 01/22/2013   Benign prostatic hyperplasia with incomplete bladder  emptying 08/26/2012   Chronic prostatitis 08/26/2012   Family history of malignant neoplasm of prostate 08/26/2012   Incomplete emptying of bladder 08/26/2012   Dysphagia 05/14/2012   Global aphasia 05/14/2012   Stroke, acute, thrombotic ([place]) 05/12/2012   Symptomatic carotid artery stenosis 05/12/2012   HTN (hypertension) 05/12/2012   COPD (chronic obstructive pulmonary disease) ([place]) 05/12/2012   Acute respiratory failure ([place]) 05/12/2012     ONSET DATE: 05/13/2012   REFERRING DIAG: E83.151 (ICD-10-CM) - Cognitive deficit, post-stroke   PERTINENT HISTORY: Pt is a 76 year old male with past medical hx of anxiety, COP, depression, diabetes mellitus without complication, HTN, hyperlipidemia.    Of note, pt received Outpatient ST services for a total  of 28 sessions ending on 03/24/2020. At the end of that POC pt required moderate assistance.      DIAGNOSTIC FINDINGS:  MRI 10/03/2019  No acute intracranial abnormality. 2. Moderate-sized chronic left MCA infarct. 3. Mild chronic small vessel ischemic disease, mildly progressed from 2014. 4. Progressive cerebral atrophy.   THERAPY DIAG:  Aphasia   Left middle cerebral artery stroke [facility])   Rationale for Evaluation and Treatment Rehabilitation   SUBJECTIVE: pt has been practicing SGD at home   Pt accompanied by: self   PAIN:  Are you having pain? No   PATIENT GOALS: he would like to be able to communicate better               OBJECTIVE:    TODAY'S TREATMENT: Skilled treatment session targeted pt's word finding and communication goals. SLP facilitated session by providing the following interventions:             Patient demonstrated improved navigation with menu and back selections on SGD with 80% accuracy x5, error with back to return from subcategory within primary categorical selection. Improved to 100% accuracy with repetitive selection training. SLP continued training selections within a category as patient is unable to identify which main category to select in order to find the selection item. Trained "my info" "Going Out" and "food" . Patient initially 0% accurate for category selection "my info" improved to 100% after multiple repetitions of selection with direct verbal commands. Patient initially 20% accurate to find items within "my info" x5 though improved to 100% after multiple repetitions and 1x1 y/n questions to identify the item asked for.   Trained semantic feature analysis as verbal compensatory strategy for word finding and communication repairs. Patient provided x2 semantic feature for a given noun/item with 70% accuracy x10 given verbal assist via feature questions to promote feature identification.                                  PATIENT EDUCATION: Education  details: SGD Person educated: Patient and wife (via text) Education method: Explanation and Handouts Education comprehension: verbalized understanding   GOALS: Goals reviewed with patient? Yes   SHORT TERM GOALS: Target date: 10 sessions   Pt will learn strategies (such as drawing, gestures) to aid during communication breakdowns.  Baseline: only using circumlocutions Goal status: INITIAL   2.  With minimal assistance, pt will use strategies to help during communication breakdowns.  Baseline: new goal Goal status: INITIAL     LONG TERM GOALS: Target date: 05/25/2022   Pt will utilize strategies to aid in communication breakdowns with Mod I.  Baseline: only using circumlocutions Goal status: INITIAL   ASSESSMENT:   CLINICAL IMPRESSION: Pt presents  with improving navigational abilities with new SGD. In addition, the icons increased his verbal language use as he demonstrated less word finding deficits. Pt continues to be an excellent candidate for SGD use.    OBJECTIVE IMPAIRMENTS include expressive language and aphasia. These impairments are limiting patient from effectively communicating at home and in community. Factors affecting potential to achieve goals and functional outcome are  time post onset (10 years) and previous ST intervention . Patient will benefit from skilled SLP services to address above impairments and improve overall function.   REHAB POTENTIAL: Fair time post onset (10 years)   PLAN: SLP FREQUENCY: 1-2x/week   SLP DURATION: 8 weeks   PLANNED INTERVENTIONS: Language facilitation, Internal/external aids, SLP instruction and feedback, Compensatory strategies, and Patient/family education   [place] L. Kathryn Cosby, M.A. CCC-SLP Adult-based Speech Language Pathologist [place] [phone]  [place], [place] 05/08/2022, 10:11 AM     [place] [facility]  SERVICES [address] [place], [place], [address] Phone: [phone]   Fax:  [phone]

## 2022-05-09 ENCOUNTER — Encounter (INDEPENDENT_AMBULATORY_CARE_PROVIDER_SITE_OTHER): Payer: Self-pay | Admitting: Vascular Surgery

## 2022-05-09 ENCOUNTER — Ambulatory Visit (INDEPENDENT_AMBULATORY_CARE_PROVIDER_SITE_OTHER): Payer: Medicare HMO | Admitting: Vascular Surgery

## 2022-05-09 VITALS — BP 124/73 | HR 78 | Resp 16 | Wt 212.0 lb

## 2022-05-09 DIAGNOSIS — E785 Hyperlipidemia, unspecified: Secondary | ICD-10-CM | POA: Diagnosis not present

## 2022-05-09 DIAGNOSIS — E119 Type 2 diabetes mellitus without complications: Secondary | ICD-10-CM

## 2022-05-09 DIAGNOSIS — I6523 Occlusion and stenosis of bilateral carotid arteries: Secondary | ICD-10-CM | POA: Diagnosis not present

## 2022-05-09 DIAGNOSIS — I10 Essential (primary) hypertension: Secondary | ICD-10-CM | POA: Diagnosis not present

## 2022-05-09 DIAGNOSIS — Z23 Encounter for immunization: Secondary | ICD-10-CM | POA: Diagnosis not present

## 2022-05-09 NOTE — Assessment & Plan Note (Signed)
blood pressure control important in reducing the progression of atherosclerotic disease. On appropriate oral medications.  

## 2022-05-09 NOTE — H&P (View-Only) (Signed)
MRN : [id number]  Paul Bass is a 76 y.o. (09-25-1945) male who presents with chief complaint of  Chief Complaint  Patient presents with   Follow-up    Ct results  .  History of Present Illness: Patient returns today in follow up of his carotid disease.  Since his last visit, he has undergone a CT angiogram of the neck which I have independently reviewed.  He had 2 previous duplex findings which suggested moderate right ICA stenosis with highly different left carotid readings.  He has a previous left carotid stent and his initial ultrasound suggested high-grade stenosis whereas the most recent ultrasound suggested mild stenosis.  The official report of the CT angiogram is of a 70% right ICA stenosis and a 50% left ICA in-stent stenosis.  I think the interpretation of the right ICA stenosis is reasonably accurate but I think this is an underreport of the in-stent stenosis as the stent seems to be markedly narrowed would appear to have significantly more than 50% stenosis.  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  He has had previous strokes with carotid intervention about a decade ago.  Current Outpatient Medications  Medication Sig Dispense Refill   albuterol (VENTOLIN HFA) 108 (90 Base) MCG/ACT inhaler SMARTSIG:2 inhalation Via Inhaler Every 4 Hours PRN     atorvastatin (LIPITOR) 80 MG tablet Take 80 mg by mouth daily.      buPROPion (WELLBUTRIN XL) 150 MG 24 hr tablet Take 150 mg by mouth at bedtime.     cholecalciferol (VITAMIN D3) 25 MCG (1000 UT) tablet Take 1,000 Units by mouth every evening.     clonazePAM (KLONOPIN) 0.5 MG tablet Take 0.5 mg by mouth 2 (two) times daily.     clopidogrel (PLAVIX) 75 MG tablet Take 75 mg by mouth daily.   1   Coenzyme Q10 (COQ10) 100 MG CAPS Take 100 mg by mouth at bedtime.     Ferrous Sulfate (IRON) 325 (65 Fe) MG TABS Take 325 mg by mouth daily.     fluticasone-salmeterol (ADVAIR) 500-50 MCG/ACT  AEPB Inhale into the lungs.     glucose blood test strip      levothyroxine (SYNTHROID, LEVOTHROID) 100 MCG tablet Take 100 mcg by mouth daily before breakfast.     metFORMIN (GLUCOPHAGE) 500 MG tablet Take 500 mg by mouth 2 (two) times daily.      metoprolol tartrate (LOPRESSOR) 25 MG tablet Take 25 mg by mouth 2 (two) times daily.     Multiple Vitamin (MULTIVITAMIN WITH MINERALS) TABS Take 1 tablet by mouth daily.     naproxen sodium (ALEVE) 220 MG tablet Take 220-440 mg by mouth 2 (two) times daily as needed (pain.).     pantoprazole (PROTONIX) 40 MG tablet Take 1 tablet (40 mg total) by mouth at bedtime. 90 tablet 3   psyllium (METAMUCIL SMOOTH TEXTURE) 28 % packet Take 1 packet by mouth daily.     RA KRILL OIL 500 MG CAPS Take 500 mg by mouth at bedtime.      tamsulosin (FLOMAX) 0.4 MG CAPS capsule TAKE 1 CAPSULE BY MOUTH EVERY DAY 30 capsule 0   venlafaxine XR (EFFEXOR-XR) 150 MG 24 hr capsule Take 150 mg by mouth daily.     No current facility-administered medications for this visit.    Past Medical History:  Diagnosis Date   Accelerated junctional rhythm    Anemia    Anxiety  a.) Tx'd with BZO PRN   Arthritis    Bilateral carotid artery disease ([name])    BPH (benign prostatic hypertrophy)    CAD S/P percutaneous coronary angioplasty    a.) PCI in 2002 placing a RCA stent (unknown type). b.) LHC 12/07/2015: 5% ISR m-dRCA, 90% dRCA, 25% pRCA, 25% p-mLCx, 100% OM3, 95% oOM2-OM2, 75% m-dLAD; refer to CVTS. c.) 3v CABG 01/03/2016   CHF (congestive heart failure) ([place])    a.) TTE 05/12/2012: EF 50-55%; mild LA enlargement; G1DD. b.)  TTE 11/26/2015: EF 45%; mild BAE; triv PR, mild MR/TR; inferolateral HK; G1DD. c.)  TTE 11/07/2017: EF 35%; RV enlargement; BAE; inferior and inferolateral HK; triv PR; mild MR/TR. d.)  TTE 09/17/2019: EF 35%; mild LVH; triv MR/TR. e.)  TTE 08/11/2021: EF 50%; triv MR/TR; G1DD.   Cognitive deficit following cerebrovascular accident (CVA)    COPD (chronic  obstructive pulmonary disease) ([name])    Cortical cataract    Depression    Dyspnea    GERD (gastroesophageal reflux disease)    History of 2019 novel coronavirus disease (COVID-19) 01/19/2021   History of acute inferior wall MI 2002   a.) PCI was performed placing a RCA stent (unknown type)   History of kidney stones    HTN (hypertension)    Hyperlipidemia    Hypothyroidism    Long term current use of antithrombotics/antiplatelets    a.) DAPT therapy (ASA + clopidogrel)   OSA (obstructive sleep apnea)    a.) does not require nocturnal PAP therapy   PLMD (periodic limb movement disorder)    Postoperative atrial fibrillation ([place]) 01/03/2016   a.) following CABG procedure   Prostatitis    PVD (peripheral vascular disease) ([place])    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 b/) residual RIGHT sided weakness   T2DM (type 2 diabetes mellitus) ([place])    Tobacco abuse     Past Surgical History:  Procedure Laterality Date   CARDIAC CATHETERIZATION N/A 12/07/2015   Procedure: Left Heart Cath and Coronary Angiography;  Surgeon: Corey Skains, MD;  Location: [place] [facility];  Service: Cardiovascular;  Laterality: N/A;   CATARACT EXTRACTION W/PHACO Left 03/24/2021   Procedure: CATARACT EXTRACTION PHACO AND INTRAOCULAR LENS PLACEMENT (IOC) LEFT DIABETIC 6.67 00:42.5;  Surgeon: Birder Robson, MD;  Location: [facility];  Service: Ophthalmology;  Laterality: Left;   COLONOSCOPY WITH PROPOFOL N/A 02/18/2019   Procedure: COLONOSCOPY WITH PROPOFOL;  Surgeon: Lucilla Lame, MD;  Location: [facility] END[facility];  Service: Endoscopy;  Laterality: N/A;   COLONOSCOPY WITH PROPOFOL N/A 12/22/2021   Procedure: COLONOSCOPY WITH PROPOFOL;  Surgeon: Lucilla Lame, MD;   Location: [facility];  Service: Endoscopy;  Laterality: N/A;   CORONARY ANGIOPLASTY WITH STENT PLACEMENT Left 2002   CORONARY ARTERY BYPASS GRAFT N/A 01/03/2016   Procedure: 3v CORONARY ARTERY BYPASS GRAFT (LIMA-LAD, SVG-PDA, SVG-OM2); Location: [facility]; Surgeon: Dwaine Deter, MD   CYSTOSCOPY W/ URETERAL STENT REMOVAL N/A 10/04/2021   Procedure: CYSTOSCOPY WITH PROSTATE FOREIGN BODY REMOVAL;  Surgeon: Abbie Sons, MD;  Location: [facility];  Service: Urology;  Laterality: N/A;   CYSTOSCOPY WITH INSERTION OF UROLIFT N/A 04/29/2019   Procedure: CYSTOSCOPY WITH INSERTION OF UROLIFT;  Surgeon: Abbie Sons, MD;  Location: [facility];  Service: Urology;  Laterality: N/A;   ESOPHAGOGASTRODUODENOSCOPY (EGD) WITH PROPOFOL N/A 09/11/2016   Procedure: ESOPHAGOGASTRODUODENOSCOPY (EGD) WITH PROPOFOL;  Surgeon: Lucilla Lame, MD;  Location: [facility] ENDOS[facility];  Service: Endoscopy;  Laterality: N/A;   ESOPHAGOGASTRODUODENOSCOPY (EGD) WITH PROPOFOL N/A 10/17/2016   Procedure: ESOPHAGOGASTRODUODENOSCOPY (EGD) WITH PROPOFOL;  Surgeon: Lucilla Lame, MD;  Location: [facility] ENDOSCOPY;  Service: Endoscopy;  Laterality: N/A;   ESOPHAGOGASTRODUODENOSCOPY (EGD) WITH PROPOFOL N/A 11/13/2017   Procedure: ESOPHAGOGASTRODUODENOSCOPY (EGD) WITH PROPOFOL;  Surgeon: Lucilla Lame, MD;  Location: [facility] ENDOSCOPY;  Service: Endoscopy;  Laterality: N/A;   pins R lower leg Left    rotator cuff replaced Right    SAVORY DILATION N/A 10/17/2016   Procedure: SAVORY DILATION;  Surgeon: Lucilla Lame, MD;  Location: [facility] ENDOSCOPY;  Service: Endoscopy;  Laterality: N/A;   TRANSURETHRAL INCISION OF PROSTATE N/A 10/04/2021   Procedure: TRANSURETHRAL INCISION OF THE PROSTATE (TUIP);  Surgeon: Abbie Sons, MD;  Location: [facility];  Service: Urology;  Laterality: N/A;     Social History   Tobacco Use   Smoking status: Former    Packs/day: 2.00    Types: Cigarettes    Quit date: 05/20/2012    Years since quitting: 9.9   Smokeless  tobacco: Never  Vaping Use   Vaping Use: Never used  Substance Use Topics   Alcohol use: Not Currently    Comment: weekend drinker, had 5 beers on 05/11/12, prior to stroke, No alcohol since stroke   Drug use: Never      Family History  Problem Relation Age of Onset   Heart failure Father    Throat cancer Mother    Diabetes Brother     No Known Allergies   REVIEW OF SYSTEMS (Negative unless checked)  Constitutional: '[]'$ Weight loss  '[]'$ Fever  '[]'$ Chills Cardiac: '[]'$ Chest pain   '[]'$ Chest pressure   '[]'$ Palpitations   '[]'$ Shortness of breath when laying flat   '[]'$ Shortness of breath at rest   '[]'$ Shortness of breath with exertion. Vascular:  '[]'$ Pain in legs with walking   '[]'$ Pain in legs at rest   '[]'$ Pain in legs when laying flat   '[]'$ Claudication   '[]'$ Pain in feet when walking  '[]'$ Pain in feet at rest  '[]'$ Pain in feet when laying flat   '[]'$ History of DVT   '[]'$ Phlebitis   '[]'$ Swelling in legs   '[]'$ Varicose veins   '[]'$ Non-healing ulcers Pulmonary:   '[]'$ Uses home oxygen   '[]'$ Productive cough   '[]'$ Hemoptysis   '[]'$ Wheeze  '[x]'$ COPD   '[]'$ Asthma Neurologic:  '[]'$ Dizziness  '[]'$ Blackouts   '[]'$ Seizures   '[x]'$ History of stroke   '[]'$ History of TIA  '[]'$ Aphasia   '[]'$ Temporary blindness   '[]'$ Dysphagia   '[]'$ Weakness or numbness in arms   '[]'$ Weakness or numbness in legs Musculoskeletal:  '[x]'$ Arthritis   '[]'$ Joint swelling   '[]'$ Joint pain   '[]'$ Low back pain Hematologic:  '[]'$ Easy bruising  '[]'$ Easy bleeding   '[]'$ Hypercoagulable state   '[x]'$ Anemic   Gastrointestinal:  '[]'$ Blood in stool   '[]'$ Vomiting blood  '[x]'$ Gastroesophageal reflux/heartburn   '[]'$ Abdominal pain Genitourinary:  '[]'$ Chronic kidney disease   '[]'$ Difficult urination  '[]'$ Frequent urination  '[]'$ Burning with urination   '[]'$ Hematuria Skin:  '[]'$ Rashes   '[]'$ Ulcers   '[]'$ Wounds Psychological:  '[x]'$ History of anxiety   '[x]'$  History of major depression.  Physical Examination  BP 124/73 (BP Location: Right Arm)   Pulse 78   Resp 16   Wt 212 lb (96.2 kg)   BMI 29.57 kg/m  Gen:  WD/WN, NAD Head: [place]/AT, No  temporalis wasting. Ear/Nose/Throat: Hearing grossly intact, nares w/o erythema  or drainage Eyes: Conjunctiva clear. Sclera non-icteric Neck: Supple.  Trachea midline Pulmonary:  Good air movement, no use of accessory muscles.  Cardiac: RRR, no JVD Vascular:  Vessel Right Left  Radial Palpable Palpable       Musculoskeletal: M/S 5/5 throughout.  No deformity or atrophy. No edema. Neurologic: Sensation grossly intact in extremities.  Symmetrical.  Partial aphasia with difficulty of words Psychiatric: Judgment intact, Mood & affect appropriate for pt's clinical situation. Dermatologic: No rashes or ulcers noted.  No cellulitis or open wounds.      Labs Recent Results (from the past 2160 hour(s))  Bladder Scan (Post Void Residual) in office     Status: None   Collection Time: 02/10/22 11:31 AM  Result Value Ref Range   Scan Result 188   I-STAT creatinine     Status: None   Collection Time: 04/27/22  3:02 PM  Result Value Ref Range   Creatinine, Ser 1.10 0.61 - 1.24 mg/dL    Radiology CT ANGIO NECK W OR WO CONTRAST  Result Date: 05/01/2022 CLINICAL DATA:  Carotid artery stenosis. History of stroke and stenting in 2013. EXAM: CT ANGIOGRAPHY NECK TECHNIQUE: Multidetector CT imaging of the neck was performed using the standard protocol during bolus administration of intravenous contrast. Multiplanar CT image reconstructions and MIPs were obtained to evaluate the vascular anatomy. Carotid stenosis measurements (when applicable) are obtained utilizing NASCET criteria, using the distal internal carotid diameter as the denominator. RADIATION DOSE REDUCTION: This exam was performed according to the departmental dose-optimization program which includes automated exposure control, adjustment of the mA and/or kV according to patient size and/or use of iterative reconstruction technique. CONTRAST:  52m OMNIPAQUE IOHEXOL 350 MG/ML SOLN COMPARISON:  07/01/2015 catheter angiogram FINDINGS: Aortic  arch: Atheromatous plaque with 3 vessel branching. Right carotid system: Extensive atheromatous plaque affecting the brachiocephalic and common carotid arteries. Calcified plaque on the posterior wall of the bulb causes 70% stenosis as measured on sagittal reformats. No ulceration or beading. Left carotid system: Diffuse atheromatous plaque affecting the common carotid. Bulky calcified plaque in the proximal ICA with stent. The mid stent lumen is narrowed by approximately 50% compared to the downstream vessel. No delayed downstream enhancement Vertebral arteries: Subclavian atherosclerosis on both sides. Advanced calcified plaque narrowing at the left vertebral origin, lumen not patent enough for accurate measurement on coronal reformats. Skeleton: Ordinary cervical spine degeneration. Other neck: No acute or aggressive finding. Upper chest: No acute finding. IMPRESSION: 1. 70% atheromatous narrowing of the proximal right ICA. 2. 50% narrowing of the left ICA at the level of mid stent wasting. 3. Advanced atheromatous narrowing at the left vertebral origin. Electronically Signed   By: JJorje GuildM.D.   On: 05/01/2022 08:10   MR CERVICAL SPINE WO CONTRAST  Result Date: 04/11/2022 CLINICAL DATA:  Upper extremity weakness R29.898 (ICD-10-CM). EXAM: MRI CERVICAL SPINE WITHOUT CONTRAST TECHNIQUE: Multiplanar, multisequence MR imaging of the cervical spine was performed. No intravenous contrast was administered. COMPARISON:  None Available. FINDINGS: Alignment: Small anterolisthesis of C6 over C7 and C7 over T1. Vertebrae: No acute fracture, evidence of discitis, or bone lesion. Mild chronic superior endplate compression fractures at C7 and T1. Cord: Normal signal and morphology. Posterior Fossa, vertebral arteries, paraspinal tissues: Negative. Disc levels: C2-3: No spinal canal or neural foraminal stenosis. C3-4: Small posterior disc osteophyte complex without significant spinal canal stenosis. Uncovertebral  and facet degenerative changes resulting in mild right and moderate left neural foraminal narrowing. C4-5: Small posterior disc osteophyte complex  without significant spinal canal stenosis. Uncovertebral and facet degenerative change resulting in mild bilateral neural foraminal narrowing, right greater than left. C5-6: Posterior disc osteophyte complex without significant spinal canal stenosis. Uncovertebral and facet degenerative changes resulting severe bilateral neural foraminal narrowing. C6-7: Small posterior disc osteophyte complex without significant spinal canal stenosis. Uncovertebral and facet degenerative change resulting in moderate to severe bilateral neural foraminal narrowing. C7-T1: No significant spinal canal or neural foraminal stenosis. IMPRESSION: Degenerative changes of the cervical spine with multilevel high-grade neural foraminal narrowing, as detailed above. No high-grade spinal canal stenosis at any level. Electronically Signed   By: Pedro Earls M.D.   On: 04/11/2022 12:16    Assessment/Plan  Bilateral carotid artery stenosis Since his last visit, he has undergone a CT angiogram of the neck which I have independently reviewed.  He had 2 previous duplex findings which suggested moderate right ICA stenosis with highly different left carotid readings.  He has a previous left carotid stent and his initial ultrasound suggested high-grade stenosis whereas the most recent ultrasound suggested mild stenosis.  The official report of the CT angiogram is of a 70% right ICA stenosis and a 50% left ICA in-stent stenosis.  I think the interpretation of the right ICA stenosis is reasonably accurate but I think this is an underreport of the in-stent stenosis as the stent seems to be markedly narrowed would appear to have significantly more than 50% stenosis. I had a long discussion with the patient and his wife today.  Given the disparate findings between the 2 ultrasounds as well as  the disparate findings between the ultrasound and the CT scan, I think a carotid angiogram would be appropriate at this point.  If we do in fact find a high-grade recurrent stenosis of the left carotid artery, intervention will be performed at that time in the form of angioplasty and potentially additional stenting.  If we only perform a diagnostic study, we may evaluate the right carotid artery stenosis as well with angiography.  I discussed the procedures in detail with the patient and his wife.  Risks and benefits are discussed and they are agreeable to proceed.  Benign essential hypertension blood pressure control important in reducing the progression of atherosclerotic disease. On appropriate oral medications.   Type 2 diabetes mellitus without complications ([name]) blood glucose control important in reducing the progression of atherosclerotic disease. Also, involved in wound healing. On appropriate medications.   HLD (hyperlipidemia) lipid control important in reducing the progression of atherosclerotic disease. Continue statin therapy    Leotis Pain, MD  05/09/2022 1:49 PM    This note was created with Dragon medical transcription system.  Any errors from dictation are purely unintentional

## 2022-05-09 NOTE — Assessment & Plan Note (Signed)
lipid control important in reducing the progression of atherosclerotic disease. Continue statin therapy  

## 2022-05-09 NOTE — Progress Notes (Signed)
MRN : [id number]  Paul Bass is a 76 y.o. (1945/08/02) male who presents with chief complaint of  Chief Complaint  Patient presents with   Follow-up    Ct results  .  History of Present Illness: Patient returns today in follow up of his carotid disease.  Since his last visit, he has undergone a CT angiogram of the neck which I have independently reviewed.  He had 2 previous duplex findings which suggested moderate right ICA stenosis with highly different left carotid readings.  He has a previous left carotid stent and his initial ultrasound suggested high-grade stenosis whereas the most recent ultrasound suggested mild stenosis.  The official report of the CT angiogram is of a 70% right ICA stenosis and a 50% left ICA in-stent stenosis.  I think the interpretation of the right ICA stenosis is reasonably accurate but I think this is an underreport of the in-stent stenosis as the stent seems to be markedly narrowed would appear to have significantly more than 50% stenosis.  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  He has had previous strokes with carotid intervention about a decade ago.  Current Outpatient Medications  Medication Sig Dispense Refill   albuterol (VENTOLIN HFA) 108 (90 Base) MCG/ACT inhaler SMARTSIG:2 inhalation Via Inhaler Every 4 Hours PRN     atorvastatin (LIPITOR) 80 MG tablet Take 80 mg by mouth daily.      buPROPion (WELLBUTRIN XL) 150 MG 24 hr tablet Take 150 mg by mouth at bedtime.     cholecalciferol (VITAMIN D3) 25 MCG (1000 UT) tablet Take 1,000 Units by mouth every evening.     clonazePAM (KLONOPIN) 0.5 MG tablet Take 0.5 mg by mouth 2 (two) times daily.     clopidogrel (PLAVIX) 75 MG tablet Take 75 mg by mouth daily.   1   Coenzyme Q10 (COQ10) 100 MG CAPS Take 100 mg by mouth at bedtime.     Ferrous Sulfate (IRON) 325 (65 Fe) MG TABS Take 325 mg by mouth daily.     fluticasone-salmeterol (ADVAIR) 500-50 MCG/ACT  AEPB Inhale into the lungs.     glucose blood test strip      levothyroxine (SYNTHROID, LEVOTHROID) 100 MCG tablet Take 100 mcg by mouth daily before breakfast.     metFORMIN (GLUCOPHAGE) 500 MG tablet Take 500 mg by mouth 2 (two) times daily.      metoprolol tartrate (LOPRESSOR) 25 MG tablet Take 25 mg by mouth 2 (two) times daily.     Multiple Vitamin (MULTIVITAMIN WITH MINERALS) TABS Take 1 tablet by mouth daily.     naproxen sodium (ALEVE) 220 MG tablet Take 220-440 mg by mouth 2 (two) times daily as needed (pain.).     pantoprazole (PROTONIX) 40 MG tablet Take 1 tablet (40 mg total) by mouth at bedtime. 90 tablet 3   psyllium (METAMUCIL SMOOTH TEXTURE) 28 % packet Take 1 packet by mouth daily.     RA KRILL OIL 500 MG CAPS Take 500 mg by mouth at bedtime.      tamsulosin (FLOMAX) 0.4 MG CAPS capsule TAKE 1 CAPSULE BY MOUTH EVERY DAY 30 capsule 0   venlafaxine XR (EFFEXOR-XR) 150 MG 24 hr capsule Take 150 mg by mouth daily.     No current facility-administered medications for this visit.    Past Medical History:  Diagnosis Date   Accelerated junctional rhythm    Anemia    Anxiety  a.) Tx'd with BZO PRN   Arthritis    Bilateral carotid artery disease ([name])    BPH (benign prostatic hypertrophy)    CAD S/P percutaneous coronary angioplasty    a.) PCI in 2002 placing a RCA stent (unknown type). b.) LHC 12/07/2015: 5% ISR m-dRCA, 90% dRCA, 25% pRCA, 25% p-mLCx, 100% OM3, 95% oOM2-OM2, 75% m-dLAD; refer to CVTS. c.) 3v CABG 01/03/2016   CHF (congestive heart failure) ([place])    a.) TTE 05/12/2012: EF 50-55%; mild LA enlargement; G1DD. b.)  TTE 11/26/2015: EF 45%; mild BAE; triv PR, mild MR/TR; inferolateral HK; G1DD. c.)  TTE 11/07/2017: EF 35%; RV enlargement; BAE; inferior and inferolateral HK; triv PR; mild MR/TR. d.)  TTE 09/17/2019: EF 35%; mild LVH; triv MR/TR. e.)  TTE 08/11/2021: EF 50%; triv MR/TR; G1DD.   Cognitive deficit following cerebrovascular accident (CVA)    COPD (chronic  obstructive pulmonary disease) ([name])    Cortical cataract    Depression    Dyspnea    GERD (gastroesophageal reflux disease)    History of 2019 novel coronavirus disease (COVID-19) 01/19/2021   History of acute inferior wall MI 2002   a.) PCI was performed placing a RCA stent (unknown type)   History of kidney stones    HTN (hypertension)    Hyperlipidemia    Hypothyroidism    Long term current use of antithrombotics/antiplatelets    a.) DAPT therapy (ASA + clopidogrel)   OSA (obstructive sleep apnea)    a.) does not require nocturnal PAP therapy   PLMD (periodic limb movement disorder)    Postoperative atrial fibrillation ([place]) 01/03/2016   a.) following CABG procedure   Prostatitis    PVD (peripheral vascular disease) ([place])    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 b/) residual RIGHT sided weakness   T2DM (type 2 diabetes mellitus) ([place])    Tobacco abuse     Past Surgical History:  Procedure Laterality Date   CARDIAC CATHETERIZATION N/A 12/07/2015   Procedure: Left Heart Cath and Coronary Angiography;  Surgeon: Corey Skains, MD;  Location: [facility];  Service: Cardiovascular;  Laterality: N/A;   CATARACT EXTRACTION W/PHACO Left 03/24/2021   Procedure: CATARACT EXTRACTION PHACO AND INTRAOCULAR LENS PLACEMENT (IOC) LEFT DIABETIC 6.67 00:42.5;  Surgeon: Birder Robson, MD;  Location: [facility];  Service: Ophthalmology;  Laterality: Left;   COLONOSCOPY WITH PROPOFOL N/A 02/18/2019   Procedure: COLONOSCOPY WITH PROPOFOL;  Surgeon: Lucilla Lame, MD;  Location: [facility];  Service: Endoscopy;  Laterality: N/A;   COLONOSCOPY WITH PROPOFOL N/A 12/22/2021   Procedure: COLONOSCOPY WITH PROPOFOL;  Surgeon: Lucilla Lame, MD;   Location: [facility] ENDOSCOPY;  Service: Endoscopy;  Laterality: N/A;   CORONARY ANGIOPLASTY WITH STENT PLACEMENT Left 2002   CORONARY ARTERY BYPASS GRAFT N/A 01/03/2016   Procedure: 3v CORONARY ARTERY BYPASS GRAFT (LIMA-LAD, SVG-PDA, SVG-OM2); Location: [facility]; Surgeon: Dwaine Deter, MD   CYSTOSCOPY W/ URETERAL STENT REMOVAL N/A 10/04/2021   Procedure: CYSTOSCOPY WITH PROSTATE FOREIGN BODY REMOVAL;  Surgeon: Abbie Sons, MD;  Location: [facility];  Service: Urology;  Laterality: N/A;   CYSTOSCOPY WITH INSERTION OF UROLIFT N/A 04/29/2019   Procedure: CYSTOSCOPY WITH INSERTION OF UROLIFT;  Surgeon: Abbie Sons, MD;  Location: [facility];  Service: Urology;  Laterality: N/A;   ESOPHAGOGASTRODUODENOSCOPY (EGD) WITH PROPOFOL N/A 09/11/2016   Procedure: ESOPHAGOGASTRODUODENOSCOPY (EGD) WITH PROPOFOL;  Surgeon: Lucilla Lame, MD;  Location: [facility];  Service: Endoscopy;  Laterality: N/A;   ESOPHAGOGASTRODUODENOSCOPY (EGD) WITH PROPOFOL N/A 10/17/2016   Procedure: ESOPHAGOGASTRODUODENOSCOPY (EGD) WITH PROPOFOL;  Surgeon: Lucilla Lame, MD;  Location: [facility];  Service: Endoscopy;  Laterality: N/A;   ESOPHAGOGASTRODUODENOSCOPY (EGD) WITH PROPOFOL N/A 11/13/2017   Procedure: ESOPHAGOGASTRODUODENOSCOPY (EGD) WITH PROPOFOL;  Surgeon: Lucilla Lame, MD;  Location: [facility];  Service: Endoscopy;  Laterality: N/A;   pins R lower leg Left    rotator cuff replaced Right    SAVORY DILATION N/A 10/17/2016   Procedure: SAVORY DILATION;  Surgeon: Lucilla Lame, MD;  Location: [facility];  Service: Endoscopy;  Laterality: N/A;   TRANSURETHRAL INCISION OF PROSTATE N/A 10/04/2021   Procedure: TRANSURETHRAL INCISION OF THE PROSTATE (TUIP);  Surgeon: Abbie Sons, MD;  Location: [facility];  Service: Urology;  Laterality: N/A;     Social History   Tobacco Use   Smoking status: Former    Packs/day: 2.00    Types: Cigarettes    Quit date: 05/20/2012    Years since quitting: 9.9   Smokeless  tobacco: Never  Vaping Use   Vaping Use: Never used  Substance Use Topics   Alcohol use: Not Currently    Comment: weekend drinker, had 5 beers on 05/11/12, prior to stroke, No alcohol since stroke   Drug use: Never      Family History  Problem Relation Age of Onset   Heart failure Father    Throat cancer Mother    Diabetes Brother     No Known Allergies   REVIEW OF SYSTEMS (Negative unless checked)  Constitutional: '[]'$ Weight loss  '[]'$ Fever  '[]'$ Chills Cardiac: '[]'$ Chest pain   '[]'$ Chest pressure   '[]'$ Palpitations   '[]'$ Shortness of breath when laying flat   '[]'$ Shortness of breath at rest   '[]'$ Shortness of breath with exertion. Vascular:  '[]'$ Pain in legs with walking   '[]'$ Pain in legs at rest   '[]'$ Pain in legs when laying flat   '[]'$ Claudication   '[]'$ Pain in feet when walking  '[]'$ Pain in feet at rest  '[]'$ Pain in feet when laying flat   '[]'$ History of DVT   '[]'$ Phlebitis   '[]'$ Swelling in legs   '[]'$ Varicose veins   '[]'$ Non-healing ulcers Pulmonary:   '[]'$ Uses home oxygen   '[]'$ Productive cough   '[]'$ Hemoptysis   '[]'$ Wheeze  '[x]'$ COPD   '[]'$ Asthma Neurologic:  '[]'$ Dizziness  '[]'$ Blackouts   '[]'$ Seizures   '[x]'$ History of stroke   '[]'$ History of TIA  '[]'$ Aphasia   '[]'$ Temporary blindness   '[]'$ Dysphagia   '[]'$ Weakness or numbness in arms   '[]'$ Weakness or numbness in legs Musculoskeletal:  '[x]'$ Arthritis   '[]'$ Joint swelling   '[]'$ Joint pain   '[]'$ Low back pain Hematologic:  '[]'$ Easy bruising  '[]'$ Easy bleeding   '[]'$ Hypercoagulable state   '[x]'$ Anemic   Gastrointestinal:  '[]'$ Blood in stool   '[]'$ Vomiting blood  '[x]'$ Gastroesophageal reflux/heartburn   '[]'$ Abdominal pain Genitourinary:  '[]'$ Chronic kidney disease   '[]'$ Difficult urination  '[]'$ Frequent urination  '[]'$ Burning with urination   '[]'$ Hematuria Skin:  '[]'$ Rashes   '[]'$ Ulcers   '[]'$ Wounds Psychological:  '[x]'$ History of anxiety   '[x]'$  History of major depression.  Physical Examination  BP 124/73 (BP Location: Right Arm)   Pulse 78   Resp 16   Wt 212 lb (96.2 kg)   BMI 29.57 kg/m  Gen:  WD/WN, NAD Head: [place]/AT, No  temporalis wasting. Ear/Nose/Throat: Hearing grossly intact, nares w/o erythema  or drainage Eyes: Conjunctiva clear. Sclera non-icteric Neck: Supple.  Trachea midline Pulmonary:  Good air movement, no use of accessory muscles.  Cardiac: RRR, no JVD Vascular:  Vessel Right Left  Radial Palpable Palpable       Musculoskeletal: M/S 5/5 throughout.  No deformity or atrophy. No edema. Neurologic: Sensation grossly intact in extremities.  Symmetrical.  Partial aphasia with difficulty of words Psychiatric: Judgment intact, Mood & affect appropriate for pt's clinical situation. Dermatologic: No rashes or ulcers noted.  No cellulitis or open wounds.      Labs Recent Results (from the past 2160 hour(s))  Bladder Scan (Post Void Residual) in office     Status: None   Collection Time: 02/10/22 11:31 AM  Result Value Ref Range   Scan Result 188   I-STAT creatinine     Status: None   Collection Time: 04/27/22  3:02 PM  Result Value Ref Range   Creatinine, Ser 1.10 0.61 - 1.24 mg/dL    Radiology CT ANGIO NECK W OR WO CONTRAST  Result Date: 05/01/2022 CLINICAL DATA:  Carotid artery stenosis. History of stroke and stenting in 2013. EXAM: CT ANGIOGRAPHY NECK TECHNIQUE: Multidetector CT imaging of the neck was performed using the standard protocol during bolus administration of intravenous contrast. Multiplanar CT image reconstructions and MIPs were obtained to evaluate the vascular anatomy. Carotid stenosis measurements (when applicable) are obtained utilizing NASCET criteria, using the distal internal carotid diameter as the denominator. RADIATION DOSE REDUCTION: This exam was performed according to the departmental dose-optimization program which includes automated exposure control, adjustment of the mA and/or kV according to patient size and/or use of iterative reconstruction technique. CONTRAST:  65m OMNIPAQUE IOHEXOL 350 MG/ML SOLN COMPARISON:  07/01/2015 catheter angiogram FINDINGS: Aortic  arch: Atheromatous plaque with 3 vessel branching. Right carotid system: Extensive atheromatous plaque affecting the brachiocephalic and common carotid arteries. Calcified plaque on the posterior wall of the bulb causes 70% stenosis as measured on sagittal reformats. No ulceration or beading. Left carotid system: Diffuse atheromatous plaque affecting the common carotid. Bulky calcified plaque in the proximal ICA with stent. The mid stent lumen is narrowed by approximately 50% compared to the downstream vessel. No delayed downstream enhancement Vertebral arteries: Subclavian atherosclerosis on both sides. Advanced calcified plaque narrowing at the left vertebral origin, lumen not patent enough for accurate measurement on coronal reformats. Skeleton: Ordinary cervical spine degeneration. Other neck: No acute or aggressive finding. Upper chest: No acute finding. IMPRESSION: 1. 70% atheromatous narrowing of the proximal right ICA. 2. 50% narrowing of the left ICA at the level of mid stent wasting. 3. Advanced atheromatous narrowing at the left vertebral origin. Electronically Signed   By: JJorje GuildM.D.   On: 05/01/2022 08:10   MR CERVICAL SPINE WO CONTRAST  Result Date: 04/11/2022 CLINICAL DATA:  Upper extremity weakness R29.898 (ICD-10-CM). EXAM: MRI CERVICAL SPINE WITHOUT CONTRAST TECHNIQUE: Multiplanar, multisequence MR imaging of the cervical spine was performed. No intravenous contrast was administered. COMPARISON:  None Available. FINDINGS: Alignment: Small anterolisthesis of C6 over C7 and C7 over T1. Vertebrae: No acute fracture, evidence of discitis, or bone lesion. Mild chronic superior endplate compression fractures at C7 and T1. Cord: Normal signal and morphology. Posterior Fossa, vertebral arteries, paraspinal tissues: Negative. Disc levels: C2-3: No spinal canal or neural foraminal stenosis. C3-4: Small posterior disc osteophyte complex without significant spinal canal stenosis. Uncovertebral  and facet degenerative changes resulting in mild right and moderate left neural foraminal narrowing. C4-5: Small posterior disc osteophyte complex  without significant spinal canal stenosis. Uncovertebral and facet degenerative change resulting in mild bilateral neural foraminal narrowing, right greater than left. C5-6: Posterior disc osteophyte complex without significant spinal canal stenosis. Uncovertebral and facet degenerative changes resulting severe bilateral neural foraminal narrowing. C6-7: Small posterior disc osteophyte complex without significant spinal canal stenosis. Uncovertebral and facet degenerative change resulting in moderate to severe bilateral neural foraminal narrowing. C7-T1: No significant spinal canal or neural foraminal stenosis. IMPRESSION: Degenerative changes of the cervical spine with multilevel high-grade neural foraminal narrowing, as detailed above. No high-grade spinal canal stenosis at any level. Electronically Signed   By: Pedro Earls M.D.   On: 04/11/2022 12:16    Assessment/Plan  Bilateral carotid artery stenosis Since his last visit, he has undergone a CT angiogram of the neck which I have independently reviewed.  He had 2 previous duplex findings which suggested moderate right ICA stenosis with highly different left carotid readings.  He has a previous left carotid stent and his initial ultrasound suggested high-grade stenosis whereas the most recent ultrasound suggested mild stenosis.  The official report of the CT angiogram is of a 70% right ICA stenosis and a 50% left ICA in-stent stenosis.  I think the interpretation of the right ICA stenosis is reasonably accurate but I think this is an underreport of the in-stent stenosis as the stent seems to be markedly narrowed would appear to have significantly more than 50% stenosis. I had a long discussion with the patient and his wife today.  Given the disparate findings between the 2 ultrasounds as well as  the disparate findings between the ultrasound and the CT scan, I think a carotid angiogram would be appropriate at this point.  If we do in fact find a high-grade recurrent stenosis of the left carotid artery, intervention will be performed at that time in the form of angioplasty and potentially additional stenting.  If we only perform a diagnostic study, we may evaluate the right carotid artery stenosis as well with angiography.  I discussed the procedures in detail with the patient and his wife.  Risks and benefits are discussed and they are agreeable to proceed.  Benign essential hypertension blood pressure control important in reducing the progression of atherosclerotic disease. On appropriate oral medications.   Type 2 diabetes mellitus without complications ([name]) blood glucose control important in reducing the progression of atherosclerotic disease. Also, involved in wound healing. On appropriate medications.   HLD (hyperlipidemia) lipid control important in reducing the progression of atherosclerotic disease. Continue statin therapy    Leotis Pain, MD  05/09/2022 1:49 PM    This note was created with Dragon medical transcription system.  Any errors from dictation are purely unintentional

## 2022-05-09 NOTE — Assessment & Plan Note (Signed)
blood glucose control important in reducing the progression of atherosclerotic disease. Also, involved in wound healing. On appropriate medications.  

## 2022-05-09 NOTE — Assessment & Plan Note (Signed)
Since his last visit, he has undergone a CT angiogram of the neck which I have independently reviewed.  He had 2 previous duplex findings which suggested moderate right ICA stenosis with highly different left carotid readings.  He has a previous left carotid stent and his initial ultrasound suggested high-grade stenosis whereas the most recent ultrasound suggested mild stenosis.  The official report of the CT angiogram is of a 70% right ICA stenosis and a 50% left ICA in-stent stenosis.  I think the interpretation of the right ICA stenosis is reasonably accurate but I think this is an underreport of the in-stent stenosis as the stent seems to be markedly narrowed would appear to have significantly more than 50% stenosis. I had a long discussion with the patient and his wife today.  Given the disparate findings between the 2 ultrasounds as well as the disparate findings between the ultrasound and the CT scan, I think a carotid angiogram would be appropriate at this point.  If we do in fact find a high-grade recurrent stenosis of the left carotid artery, intervention will be performed at that time in the form of angioplasty and potentially additional stenting.  If we only perform a diagnostic study, we may evaluate the right carotid artery stenosis as well with angiography.  I discussed the procedures in detail with the patient and his wife.  Risks and benefits are discussed and they are agreeable to proceed.

## 2022-05-10 ENCOUNTER — Ambulatory Visit: Payer: Medicare HMO | Admitting: Speech Pathology

## 2022-05-10 ENCOUNTER — Telehealth (INDEPENDENT_AMBULATORY_CARE_PROVIDER_SITE_OTHER): Payer: Self-pay

## 2022-05-10 NOTE — Telephone Encounter (Signed)
Spoke with the patient's spouse and the patient is scheduled with Dr. Lucky Cowboy for a left carotid angio possible stent placement on 05/18/22 with a 6:45 am arrival time to the [facility]. Pre-procedure instructions were discussed and will be mailed.

## 2022-05-11 ENCOUNTER — Other Ambulatory Visit: Payer: Self-pay | Admitting: Urology

## 2022-05-14 ENCOUNTER — Other Ambulatory Visit: Payer: Self-pay

## 2022-05-14 ENCOUNTER — Emergency Department
Admission: EM | Admit: 2022-05-14 | Discharge: 2022-05-14 | Disposition: A | Discharge status: Home / Self Care | Payer: Medicare HMO | Attending: Emergency Medicine | Admitting: Emergency Medicine

## 2022-05-14 ENCOUNTER — Emergency Department: Payer: Medicare HMO

## 2022-05-14 DIAGNOSIS — N433 Hydrocele, unspecified: Secondary | ICD-10-CM

## 2022-05-14 DIAGNOSIS — N5089 Other specified disorders of the male genital organs: Secondary | ICD-10-CM | POA: Diagnosis present

## 2022-05-14 DIAGNOSIS — J449 Chronic obstructive pulmonary disease, unspecified: Secondary | ICD-10-CM | POA: Diagnosis not present

## 2022-05-14 DIAGNOSIS — N503 Cyst of epididymis: Secondary | ICD-10-CM | POA: Diagnosis not present

## 2022-05-14 DIAGNOSIS — N3000 Acute cystitis without hematuria: Secondary | ICD-10-CM

## 2022-05-14 DIAGNOSIS — I509 Heart failure, unspecified: Secondary | ICD-10-CM | POA: Diagnosis not present

## 2022-05-14 DIAGNOSIS — I110 Hypertensive heart disease with heart failure: Secondary | ICD-10-CM | POA: Insufficient documentation

## 2022-05-14 DIAGNOSIS — E119 Type 2 diabetes mellitus without complications: Secondary | ICD-10-CM | POA: Insufficient documentation

## 2022-05-14 DIAGNOSIS — Z8673 Personal history of transient ischemic attack (TIA), and cerebral infarction without residual deficits: Secondary | ICD-10-CM | POA: Diagnosis not present

## 2022-05-14 DIAGNOSIS — I861 Scrotal varices: Secondary | ICD-10-CM | POA: Diagnosis not present

## 2022-05-14 LAB — URINALYSIS, ROUTINE W REFLEX MICROSCOPIC
Bilirubin Urine: NEGATIVE
Glucose, UA: NEGATIVE mg/dL
Hgb urine dipstick: NEGATIVE
Ketones, ur: NEGATIVE mg/dL
Nitrite: POSITIVE — AB
Protein, ur: 30 mg/dL — AB
Specific Gravity, Urine: 1.017 (ref 1.005–1.030)
WBC, UA: >50 WBC/hpf — ABNORMAL HIGH (ref 0–5)
pH: 5 (ref 5.0–8.0)

## 2022-05-14 MED ORDER — CEFDINIR 300 MG PO CAPS
300.0000 mg | ORAL_CAPSULE | Freq: Once | ORAL | Status: AC
  Administered 2022-05-14: 300 mg via ORAL
  Filled 2022-05-14: qty 1

## 2022-05-14 MED ORDER — CEFPODOXIME PROXETIL 200 MG PO TABS: 200.0000 mg | ORAL_TABLET | Freq: Two times a day (BID) | ORAL | 0 refills | Status: AC

## 2022-05-14 NOTE — ED Provider Notes (Signed)
[facility] Provider Note    Event Date/Time   First MD Initiated Contact with Patient 05/14/22 1503     (approximate)   History   Chief Complaint Testicle Pain (With swelling)   HPI  Paul Bass is a 76 y.o. male with past medical history of hypertension, hyperlipidemia, diabetes, COPD, CHF, stroke, and peripheral vascular disease who presents to the ED complaining of testicular swelling.  Patient reports that for the past 5 days he has been dealing with increasing swelling around his right testicle.  He denies any recent injury to the area and states it is not particularly painful.  He has not noticed any redness, rash, or drainage from his scrotum.  He denies any difficulty urinating, but does state that he has been urinating more frequently than usual.  He denies any fevers, abdominal pain, or flank pain.  He was initially seen by his PCP, who referred him to the ED for further evaluation.     Physical Exam   Triage Vital Signs: ED Triage Vitals  Enc Vitals Group     BP 05/14/22 1347 (!) 140/100     Pulse Rate 05/14/22 1347 (!) 112     Resp 05/14/22 1347 18     Temp 05/14/22 1347 97.7 F (36.5 C)     Temp Source 05/14/22 1347 Oral     SpO2 05/14/22 1347 96 %     Weight 05/14/22 1348 212 lb (96.2 kg)     Height 05/14/22 1348 '5\' 11"'$  (1.803 m)     Head Circumference --      Peak Flow --      Pain Score 05/14/22 1348 0     Pain Loc --      Pain Edu? --      Excl. in [employer]? --     Most recent vital signs: Vitals:   05/14/22 1530 05/14/22 1630  BP: 120/68 127/72  Pulse: (!) 55 (!) 52  Resp: 18 18  Temp:    SpO2: 96% 96%    Constitutional: Alert and oriented. Eyes: Conjunctivae are normal. Head: Atraumatic. Nose: No congestion/rhinnorhea. Mouth/Throat: Mucous membranes are moist.  Cardiovascular: Normal rate, regular rhythm. Grossly normal heart sounds.  2+ radial pulses bilaterally. Respiratory: Normal respiratory effort.  No  retractions. Lungs CTAB. Gastrointestinal: Soft and nontender. No distention.  No CVA tenderness bilaterally. Genitourinary: Right testicular edema noted without tenderness, no overlying erythema, warmth, or skin lesions noted. Musculoskeletal: No lower extremity tenderness nor edema.  Neurologic:  Normal speech and language. No gross focal neurologic deficits are appreciated.    ED Results / Procedures / Treatments   Labs (all labs ordered are listed, but only abnormal results are displayed) Labs Reviewed  URINALYSIS, ROUTINE W REFLEX MICROSCOPIC - Abnormal; Notable for the following components:      Result Value   Color, Urine YELLOW (*)    APPearance CLOUDY (*)    Protein, ur 30 (*)    Nitrite POSITIVE (*)    Leukocytes,Ua LARGE (*)    WBC, UA >50 (*)    Bacteria, UA RARE (*)    All other components within normal limits  URINE CULTURE    RADIOLOGY Testicular ultrasound reviewed and interpreted by me with right hydrocele noted, no evidence of torsion.  PROCEDURES:  Critical Care performed: No  Procedures   MEDICATIONS ORDERED IN ED: Medications  cefdinir (OMNICEF) capsule 300 mg (300 mg Oral Given 05/14/22 1632)     IMPRESSION / MDM /  ASSESSMENT AND PLAN / ED COURSE  I reviewed the triage vital signs and the nursing notes.                              76 y.o. male with past medical history of hypertension, hyperlipidemia, diabetes, stroke, COPD, CHF, and peripheral vascular disease who presents to the ED complaining of testicular swelling increasing over the past 5 days with frequent urination.  Patient's presentation is most consistent with acute presentation with potential threat to life or bodily function.  Differential diagnosis includes, but is not limited to, testicular torsion, scrotal cellulitis, Fournier's gangrene, kidney stone, cystitis, epididymitis, hydrocele.  Patient nontoxic-appearing and in no acute distress, vital signs are unremarkable.  On  exam there are no obvious signs of infection to the skin or soft tissue and he has minimal tenderness to go with the edema of his right testicle.  Ultrasound is reassuring with no evidence of torsion, does show bilateral hydroceles, right greater than left, that would explain the swelling.  Urinalysis does appear consistent with infection, likely correlating with his urinary frequency.  We will send urine for culture and treat with cefpodoxime, no reason to suggest pyocele at this point given reassuring exam.  He is appropriate for outpatient follow-up with urology, states he has an appointment scheduled in 5 days at [facility].  He was counseled to return to the ED for new or worsening symptoms, patient agrees with plan.      FINAL CLINICAL IMPRESSION(S) / ED DIAGNOSES   Final diagnoses:  Acute cystitis without hematuria  Hydrocele, unspecified hydrocele type     Rx / DC Orders   ED Discharge Orders          Ordered    cefpodoxime (VANTIN) 200 MG tablet  2 times daily        05/14/22 1625             Note:  This document was prepared using Dragon voice recognition software and may include unintentional dictation errors.   Blake Divine, MD 05/14/22 [phone]

## 2022-05-14 NOTE — ED Notes (Signed)
As per provider, no blood work at this time.

## 2022-05-14 NOTE — ED Triage Notes (Signed)
As per pt/family, on this past Tuesday pt had a flu shot and that evening her left testicle started to swell and become painful. Swelling improved and then got worse. Pt went to [facility] clinic today and was sent here due to significant swelling and concern for blood flow. Pt endorses increased pain with movement. Pt also states scrotum is red.

## 2022-05-15 ENCOUNTER — Telehealth (INDEPENDENT_AMBULATORY_CARE_PROVIDER_SITE_OTHER): Payer: Self-pay

## 2022-05-15 NOTE — Telephone Encounter (Signed)
Spoke with the patient's spouse and due to him being on antibiotics for 11 days his procedure has to be rescheduled. He has been rescheduled to 06/01/22 with a 6:45 am arrival time to the MM.

## 2022-05-16 ENCOUNTER — Ambulatory Visit: Payer: Medicare HMO | Admitting: Speech Pathology

## 2022-05-16 DIAGNOSIS — I63512 Cerebral infarction due to unspecified occlusion or stenosis of left middle cerebral artery: Secondary | ICD-10-CM | POA: Diagnosis not present

## 2022-05-16 DIAGNOSIS — R471 Dysarthria and anarthria: Secondary | ICD-10-CM | POA: Diagnosis not present

## 2022-05-16 DIAGNOSIS — R4701 Aphasia: Secondary | ICD-10-CM

## 2022-05-16 DIAGNOSIS — R41841 Cognitive communication deficit: Secondary | ICD-10-CM | POA: Diagnosis not present

## 2022-05-17 LAB — URINE CULTURE: Culture: >=100000 — AB

## 2022-05-17 NOTE — Therapy (Signed)
OUTPATIENT SPEECH LANGUAGE PATHOLOGY TREATMENT  and 10th VISIT PROGRESS NOTE   Patient Name: Paul Bass MRN: [id number] DOB:May 06, 1946, 76 y.o., male Today's Date: 05/17/2022  Speech Therapy Progress Note  Dates of Reporting Period: 03/30/2022 to 05/16/2022  Objective: Patient has been seen for 10 speech therapy sessions this reporting period targeting severe expressive aphasia, specifically strategies to utilize during communication breakdowns such as writing, drawing, gesturing.  Patient is making progress toward LTGs and he was unable to effectively use strategies to help during communication breakdowns. Therefore trial use of AAC was initiated. See skilled intervention, clinical impressions, and goals below for details.   PCP: [name], NP REFERRING PROVIDER: Jennings Books, MD  END OF SESSION:   End of Session - 05/17/22 0824     Visit Number 10    Number of Visits 17    Date for SLP Re-Evaluation 05/25/22    Authorization Type [employer] HMO    Authorization Time Period 03/30/2022 thru 06/02/2022    Authorization - Visit Number 10    Authorization - Number of Visits 19    Progress Note Due on Visit 10    SLP Start Time 1100    SLP Stop Time  1200    SLP Time Calculation (min) 60 min    Activity Tolerance Patient tolerated treatment well             Past Medical History:  Diagnosis Date   Accelerated junctional rhythm    Anemia    Anxiety    a.) Tx'd with BZO PRN   Arthritis    Bilateral carotid artery disease ([name])    BPH (benign prostatic hypertrophy)    CAD S/P percutaneous coronary angioplasty    a.) PCI in 2002 placing a RCA stent (unknown type). b.) LHC 12/07/2015: 5% ISR m-dRCA, 90% dRCA, 25% pRCA, 25% p-mLCx, 100% OM3, 95% oOM2-OM2, 75% m-dLAD; refer to CVTS. c.) 3v CABG 01/03/2016   CHF (congestive heart failure) ([place])    a.) TTE 05/12/2012: EF 50-55%; mild LA enlargement; G1DD. b.)  TTE 11/26/2015: EF 45%; mild BAE; triv PR, mild  MR/TR; inferolateral HK; G1DD. c.)  TTE 11/07/2017: EF 35%; RV enlargement; BAE; inferior and inferolateral HK; triv PR; mild MR/TR. d.)  TTE 09/17/2019: EF 35%; mild LVH; triv MR/TR. e.)  TTE 08/11/2021: EF 50%; triv MR/TR; G1DD.   Cognitive deficit following cerebrovascular accident (CVA)    COPD (chronic obstructive pulmonary disease) ([name])    Cortical cataract    Depression    Dyspnea    GERD (gastroesophageal reflux disease)    History of 2019 novel coronavirus disease (COVID-19) 01/19/2021   History of acute inferior wall MI 2002   a.) PCI was performed placing a RCA stent (unknown type)   History of kidney stones    HTN (hypertension)    Hyperlipidemia    Hypothyroidism    Long term current use of antithrombotics/antiplatelets    a.) DAPT therapy (ASA + clopidogrel)   OSA (obstructive sleep apnea)    a.) does not require nocturnal PAP therapy   PLMD (periodic limb movement disorder)    Postoperative atrial fibrillation ([place]) 01/03/2016   a.) following CABG procedure   Prostatitis    PVD (peripheral vascular disease) ([place])    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 b/) residual RIGHT sided weakness   T2DM (type 2 diabetes mellitus) ([place])    Tobacco abuse    Past Surgical History:  Procedure Laterality Date   CARDIAC CATHETERIZATION N/A 12/07/2015   Procedure: Left Heart Cath and Coronary Angiography;  Surgeon: Corey Skains, MD;  Location: [facility];  Service: Cardiovascular;  Laterality: N/A;   CATARACT EXTRACTION W/PHACO Left 03/24/2021   Procedure: CATARACT EXTRACTION PHACO AND INTRAOCULAR LENS PLACEMENT (IOC) LEFT DIABETIC 6.67 00:42.5;  Surgeon: Birder Robson, MD;  Location: [facility];  Service: Ophthalmology;   Laterality: Left;   COLONOSCOPY WITH PROPOFOL N/A 02/18/2019   Procedure: COLONOSCOPY WITH PROPOFOL;  Surgeon: Lucilla Lame, MD;  Location: [facility] ENDOSCOPY;  Service: Endoscopy;  Laterality: N/A;   COLONOSCOPY WITH PROPOFOL N/A 12/22/2021   Procedure: COLONOSCOPY WITH PROPOFOL;  Surgeon: Lucilla Lame, MD;  Location: [facility] ENDOSCOPY;  Service: Endoscopy;  Laterality: N/A;   CORONARY ANGIOPLASTY WITH STENT PLACEMENT Left 2002   CORONARY ARTERY BYPASS GRAFT N/A 01/03/2016   Procedure: 3v CORONARY ARTERY BYPASS GRAFT (LIMA-LAD, SVG-PDA, SVG-OM2); Location: [facility]; Surgeon: Dwaine Deter, MD   CYSTOSCOPY W/ URETERAL STENT REMOVAL N/A 10/04/2021   Procedure: CYSTOSCOPY WITH PROSTATE FOREIGN BODY REMOVAL;  Surgeon: Abbie Sons, MD;  Location: [facility];  Service: Urology;  Laterality: N/A;   CYSTOSCOPY WITH INSERTION OF UROLIFT N/A 04/29/2019   Procedure: CYSTOSCOPY WITH INSERTION OF UROLIFT;  Surgeon: Abbie Sons, MD;  Location: [facility];  Service: Urology;  Laterality: N/A;   ESOPHAGOGASTRODUODENOSCOPY (EGD) WITH PROPOFOL N/A 09/11/2016   Procedure: ESOPHAGOGASTRODUODENOSCOPY (EGD) WITH PROPOFOL;  Surgeon: Lucilla Lame, MD;  Location: [facility] ENDOSCOPY;  Service: Endoscopy;  Laterality: N/A;   ESOPHAGOGASTRODUODENOSCOPY (EGD) WITH PROPOFOL N/A 10/17/2016   Procedure: ESOPHAGOGASTRODUODENOSCOPY (EGD) WITH PROPOFOL;  Surgeon: Lucilla Lame, MD;  Location: [facility] ENDOSCOPY;  Service: Endoscopy;  Laterality: N/A;   ESOPHAGOGASTRODUODENOSCOPY (EGD) WITH PROPOFOL N/A 11/13/2017   Procedure: ESOPHAGOGASTRODUODENOSCOPY (EGD) WITH PROPOFOL;  Surgeon: Lucilla Lame, MD;  Location: [facility] ENDOSCOPY;  Service: Endoscopy;  Laterality: N/A;   pins R lower leg Left    rotator cuff replaced Right    SAVORY DILATION N/A 10/17/2016   Procedure: SAVORY DILATION;  Surgeon: Lucilla Lame, MD;  Location: [facility] ENDOSCOPY;  Service: Endoscopy;  Laterality: N/A;   TRANSURETHRAL INCISION OF PROSTATE N/A 10/04/2021   Procedure:  TRANSURETHRAL INCISION OF THE PROSTATE (TUIP);  Surgeon: Abbie Sons, MD;  Location: [facility];  Service: Urology;  Laterality: N/A;   Patient Active Problem List   Diagnosis Date Noted   History of colonic polyps    Anxiety state 11/17/2021   Cognitive deficit, post-stroke 11/17/2021   Ex-smoker 11/17/2021   Bradycardia 51/88/4166   Chronic systolic CHF (congestive heart failure), NYHA class 2 ([place]) 05/27/2020   Accelerated junctional rhythm 04/21/2019   Encounter for screening colonoscopy    Polyp of colon    Atherosclerotic heart disease of native coronary artery without angina pectoris 01/14/2019   Depression 01/14/2019   GERD (gastroesophageal reflux disease) 01/14/2019   Periodic limb movement disorder (PLMD) 01/14/2019   Sleep apnea 01/14/2019   Type 2 diabetes mellitus without complications ([place]) 01/28/1600   Stricture and stenosis of esophagus    Problems with swallowing and mastication    Food impaction of esophagus    H/O coronary artery bypass surgery 01/17/2016   Cerebrovascular accident (CVA) due to embolism ([place]) 01/03/2016   Hypothyroidism, unspecified 01/03/2016   HLD (hyperlipidemia) 01/03/2016   Stable angina  11/29/2015   Benign essential hypertension 05/27/2015   Bilateral carotid artery stenosis 02/24/2015   Atherosclerotic peripheral vascular disease ([place]) 09/07/2014   Bladder calculus 08/12/2014   Primary osteoarthritis of left knee 03/10/2014   Elevated prostate specific antigen (PSA) 02/05/2013   Snoring 01/22/2013   Sleepiness 01/22/2013   Dysphagia, unspecified(787.20) 01/22/2013   Occlusion and stenosis of carotid artery without mention of cerebral infarction 01/22/2013   Cerebral thrombosis with cerebral infarction ([place]) 01/22/2013   Benign prostatic hyperplasia with incomplete bladder emptying 08/26/2012   Chronic prostatitis 08/26/2012   Family history of malignant neoplasm of prostate 08/26/2012   Incomplete emptying of bladder 08/26/2012    Dysphagia 05/14/2012   Global aphasia 05/14/2012   Stroke, acute, thrombotic ([place]) 05/12/2012   Symptomatic carotid artery stenosis 05/12/2012   HTN (hypertension) 05/12/2012   COPD (chronic obstructive pulmonary disease) ([place]) 05/12/2012   Acute respiratory failure ([place]) 05/12/2012    ONSET DATE: 05/13/2012  REFERRING DIAG: Z61.096 (ICD-10-CM) - Cognitive deficit, post-stroke   PERTINENT HISTORY: Pt is a 76 year old male with past medical hx of anxiety, COP, depression, diabetes mellitus without complication, HTN, hyperlipidemia.    Of note, pt received Outpatient ST services for a total of 28 sessions ending on 03/24/2020. At the end of that POC pt required moderate assistance.      DIAGNOSTIC FINDINGS:  MRI 10/03/2019  No acute intracranial abnormality. 2. Moderate-sized chronic left MCA infarct. 3. Mild chronic small vessel ischemic disease, mildly progressed from 2014. 4. Progressive cerebral atrophy.  THERAPY DIAG:  Aphasia  Left middle cerebral artery stroke [facility])  Rationale for Evaluation and Treatment Rehabilitation  SUBJECTIVE: pt brought in his trial device, "I have been using it some"  Pt accompanied by: self  PAIN:  Are you having pain? No  PATIENT GOALS: he would like to be able to communicate better   OBJECTIVE:   TODAY'S TREATMENT: Skilled treatment session targeted pt's word finding and communication breakdown goals. SLP facilitated session by providing the following interventions:  In consultation with pt along with visual support for information reporting, SLP created the following icons to help pt gain independent communication.   Grocery Stores: I need to go to [employer] I need to go to [employer] [facility]: [employer] of pt's current medicines to refill prescription icon   Banking:  I would like to cash this check I would like to deposit this in my checking accounts I would like to deposit this in my savings  account  Restaurant: To improve independence when ordering at his favorite restaurants, the following were created for local restaurants: [employer]  Chef Salad - no onions, no cucumbers, thousand island dressing, half and half sweet tea  [employer] - potato salad, half and half sweet tea [employer]  [employer] meat lovers pizza, thin crust [place]  [place] [employer] - no onions      PATIENT EDUCATION: Education details: SGD Person educated: Patient and wife (via text) Education method: Explanation and Handouts Education comprehension: verbalized understanding  GOALS: Goals reviewed with patient? Yes   SHORT TERM GOALS: Target date: 10 sessions  03/30/2022 - 05/16/2022 Pt will learn strategies (such as drawing, gestures) to aid during communication breakdowns.  Baseline: only using circumlocutions 05/16/2022 - Goal status: DISCONTINUED, pt unable d/t motor deficits   2.  With minimal assistance, pt will use strategies to help during communication breakdowns.  Baseline: new goal 05/16/2022 Goal status: DISCONTINUED, no viable strategies identified as successful  05/16/2022  3.  Pt will respond to general questions using AAC/nonverbal communication effectively with 50% accuracy and mod cues.  Baseline: 25% Goal Status: INITIAL  4.  Pt will use AAC to communicate within group situations with 50% accuracy given Mod to Min cues.   Baseline: 15% Goal Status: INITIAL  5.   Pt will use AAC to communicate in a structured conversation setting with 75% accuracy and min cues.  Baseline: 25% Goal Status: INITIAL     LONG TERM GOALS: Target date: 05/25/2022   03/30/2022 thru 05/16/2022 Pt will utilize strategies to aid in communication breakdowns with Mod I.  Baseline: only using circumlocutions 05/16/2022 Goal status: DISCONTINUED, no viable strategies identified as successful  05/16/2022 2.  To maximize functional communication across all communication contexts  using AAC and multimodal means of communication. Baseline: 25% Goal status: INITIAL      ASSESSMENT:  CLINICAL IMPRESSION: Pt presents with chronic severe expressive aphasia which pt describes as "I can see what to say, I just can't." Pt is responding well to trial AAC device to supplement his intermittent use of word level communication.   OBJECTIVE IMPAIRMENTS include expressive language and aphasia. These impairments are limiting patient from effectively communicating at home and in community. Factors affecting potential to achieve goals and functional outcome are  time post onset (10 years) and previous ST intervention . Patient will benefit from skilled SLP services to address above impairments and improve overall function.  REHAB POTENTIAL: Fair time post onset (10 years)  PLAN: SLP FREQUENCY: 1-2x/week  SLP DURATION: 8 weeks  PLANNED INTERVENTIONS: Language facilitation, Internal/external aids, SLP instruction and feedback, Compensatory strategies, and Patient/family education   Marcy Bogosian B. Rutherford Nail, M.S., CCC-SLP, Mining engineer Certified Brain Injury [place]  [place] Office [phone] Ascom [phone] Fax [phone]

## 2022-05-18 ENCOUNTER — Ambulatory Visit: Payer: Medicare HMO | Admitting: Speech Pathology

## 2022-05-18 DIAGNOSIS — I63512 Cerebral infarction due to unspecified occlusion or stenosis of left middle cerebral artery: Secondary | ICD-10-CM | POA: Diagnosis not present

## 2022-05-18 DIAGNOSIS — R471 Dysarthria and anarthria: Secondary | ICD-10-CM | POA: Diagnosis not present

## 2022-05-18 DIAGNOSIS — R41841 Cognitive communication deficit: Secondary | ICD-10-CM | POA: Diagnosis not present

## 2022-05-18 DIAGNOSIS — R4701 Aphasia: Secondary | ICD-10-CM | POA: Diagnosis not present

## 2022-05-18 DIAGNOSIS — I6523 Occlusion and stenosis of bilateral carotid arteries: Secondary | ICD-10-CM

## 2022-05-18 NOTE — Progress Notes (Signed)
ED Antimicrobial Stewardship Positive Culture Follow Up   Paul Bass is an 76 y.o. male who presented to [facility] on 05/14/2022 with a chief complaint of testicular swelling.  Chief Complaint  Patient presents with   Testicle Pain    With swelling    Recent Results (from the past 720 hour(s))  Urine Culture     Status: Abnormal   Collection Time: 05/14/22  1:58 PM   Specimen: Urine, Clean Catch  Result Value Ref Range Status   Specimen Description   Final    URINE, CLEAN CATCH Performed at [facility], [address] [place], [place] [address]    Special Requests   Final    NONE Performed at [facility], [address] [place], [place] [address]    Culture >=100,000 COLONIES/mL PSEUDOMONAS AERUGINOSA (A)  Final   Report Status 05/17/2022 FINAL  Final   Organism ID, Bacteria PSEUDOMONAS AERUGINOSA (A)  Final      Susceptibility   Pseudomonas aeruginosa - MIC*    CEFTAZIDIME 4 SENSITIVE Sensitive     CIPROFLOXACIN <=0.25 SENSITIVE Sensitive     GENTAMICIN <=1 SENSITIVE Sensitive     IMIPENEM <=0.25 SENSITIVE Sensitive     PIP/TAZO 8 SENSITIVE Sensitive     CEFEPIME 2 SENSITIVE Sensitive     * >=100,000 COLONIES/mL PSEUDOMONAS AERUGINOSA    '[x]'$  Treated with cefpodoxime 200 mg twice daily for 7 days, organism resistant to prescribed antimicrobial '[]'$  Patient discharged originally without antimicrobial agent and treatment is now indicated  New antibiotic prescription: Ciprofloxacin 250 mg twice daily for 5 days  ED Provider: Nance Pear, MD  Dara Hoyer, PharmD PGY-1 Pharmacy Resident 05/18/2022 12:17 PM

## 2022-05-19 DIAGNOSIS — Z87442 Personal history of urinary calculi: Secondary | ICD-10-CM | POA: Diagnosis not present

## 2022-05-19 DIAGNOSIS — E039 Hypothyroidism, unspecified: Secondary | ICD-10-CM | POA: Diagnosis not present

## 2022-05-19 DIAGNOSIS — E119 Type 2 diabetes mellitus without complications: Secondary | ICD-10-CM | POA: Diagnosis not present

## 2022-05-19 DIAGNOSIS — N390 Urinary tract infection, site not specified: Secondary | ICD-10-CM | POA: Diagnosis not present

## 2022-05-19 DIAGNOSIS — R339 Retention of urine, unspecified: Secondary | ICD-10-CM | POA: Diagnosis not present

## 2022-05-19 DIAGNOSIS — R319 Hematuria, unspecified: Secondary | ICD-10-CM | POA: Diagnosis not present

## 2022-05-19 DIAGNOSIS — J449 Chronic obstructive pulmonary disease, unspecified: Secondary | ICD-10-CM | POA: Diagnosis not present

## 2022-05-19 DIAGNOSIS — N200 Calculus of kidney: Secondary | ICD-10-CM | POA: Diagnosis not present

## 2022-05-19 DIAGNOSIS — I10 Essential (primary) hypertension: Secondary | ICD-10-CM | POA: Diagnosis not present

## 2022-05-19 DIAGNOSIS — N401 Enlarged prostate with lower urinary tract symptoms: Secondary | ICD-10-CM | POA: Diagnosis not present

## 2022-05-19 DIAGNOSIS — E785 Hyperlipidemia, unspecified: Secondary | ICD-10-CM | POA: Diagnosis not present

## 2022-05-19 DIAGNOSIS — R3915 Urgency of urination: Secondary | ICD-10-CM | POA: Diagnosis not present

## 2022-05-19 DIAGNOSIS — I2510 Atherosclerotic heart disease of native coronary artery without angina pectoris: Secondary | ICD-10-CM | POA: Diagnosis not present

## 2022-05-22 NOTE — Therapy (Signed)
OUTPATIENT SPEECH LANGUAGE PATHOLOGY TREATMENT   Patient Name: Paul Bass MRN: [id number] DOB:03-Apr-1946, 76 y.o., male Today's Date: 05/22/2022  PCP: [name], NP REFERRING PROVIDER: Jennings Books, MD  END OF SESSION:   End of Session - 05/22/22 1246     Visit Number 11    Number of Visits 17    Date for SLP Re-Evaluation 05/25/22    Authorization Type [employer] HMO    Authorization Time Period 03/30/2022 thru 06/02/2022    Authorization - Visit Number 11    Authorization - Number of Visits 19    Progress Note Due on Visit 20    SLP Start Time 1100    SLP Stop Time  1200    SLP Time Calculation (min) 60 min    Activity Tolerance Patient tolerated treatment well             Past Medical History:  Diagnosis Date   Accelerated junctional rhythm    Anemia    Anxiety    a.) Tx'd with BZO PRN   Arthritis    Bilateral carotid artery disease ([name])    BPH (benign prostatic hypertrophy)    CAD S/P percutaneous coronary angioplasty    a.) PCI in 2002 placing a RCA stent (unknown type). b.) LHC 12/07/2015: 5% ISR m-dRCA, 90% dRCA, 25% pRCA, 25% p-mLCx, 100% OM3, 95% oOM2-OM2, 75% m-dLAD; refer to CVTS. c.) 3v CABG 01/03/2016   CHF (congestive heart failure) ([place])    a.) TTE 05/12/2012: EF 50-55%; mild LA enlargement; G1DD. b.)  TTE 11/26/2015: EF 45%; mild BAE; triv PR, mild MR/TR; inferolateral HK; G1DD. c.)  TTE 11/07/2017: EF 35%; RV enlargement; BAE; inferior and inferolateral HK; triv PR; mild MR/TR. d.)  TTE 09/17/2019: EF 35%; mild LVH; triv MR/TR. e.)  TTE 08/11/2021: EF 50%; triv MR/TR; G1DD.   Cognitive deficit following cerebrovascular accident (CVA)    COPD (chronic obstructive pulmonary disease) ([name])    Cortical cataract    Depression    Dyspnea    GERD (gastroesophageal reflux disease)    History of 2019 novel coronavirus disease (COVID-19) 01/19/2021   History of acute inferior wall MI 2002   a.) PCI was performed placing a RCA stent  (unknown type)   History of kidney stones    HTN (hypertension)    Hyperlipidemia    Hypothyroidism    Long term current use of antithrombotics/antiplatelets    a.) DAPT therapy (ASA + clopidogrel)   OSA (obstructive sleep apnea)    a.) does not require nocturnal PAP therapy   PLMD (periodic limb movement disorder)    Postoperative atrial fibrillation ([place]) 01/03/2016   a.) following CABG procedure   Prostatitis    PVD (peripheral vascular disease) ([place])    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 b/) residual RIGHT sided weakness   T2DM (type 2 diabetes mellitus) ([place])    Tobacco abuse    Past Surgical History:  Procedure Laterality Date   CARDIAC CATHETERIZATION N/A 12/07/2015   Procedure: Left Heart Cath and Coronary Angiography;  Surgeon: Corey Skains, MD;  Location: [facility];  Service: Cardiovascular;  Laterality: N/A;   CATARACT EXTRACTION W/PHACO Left 03/24/2021   Procedure: CATARACT EXTRACTION PHACO AND INTRAOCULAR LENS PLACEMENT (IOC)  LEFT DIABETIC 6.67 00:42.5;  Surgeon: Birder Robson, MD;  Location: [facility];  Service: Ophthalmology;  Laterality: Left;   COLONOSCOPY WITH PROPOFOL N/A 02/18/2019   Procedure: COLONOSCOPY WITH PROPOFOL;  Surgeon: Lucilla Lame, MD;  Location: [facility] ENDOSCOPY;  Service: Endoscopy;  Laterality: N/A;   COLONOSCOPY WITH PROPOFOL N/A 12/22/2021   Procedure: COLONOSCOPY WITH PROPOFOL;  Surgeon: Lucilla Lame, MD;  Location: [facility] ENDOSCOPY;  Service: Endoscopy;  Laterality: N/A;   CORONARY ANGIOPLASTY WITH STENT PLACEMENT Left 2002   CORONARY ARTERY BYPASS GRAFT N/A 01/03/2016   Procedure: 3v CORONARY ARTERY BYPASS GRAFT (LIMA-LAD, SVG-PDA, SVG-OM2); Location: [facility]; Surgeon: Dwaine Deter, MD    CYSTOSCOPY W/ URETERAL STENT REMOVAL N/A 10/04/2021   Procedure: CYSTOSCOPY WITH PROSTATE FOREIGN BODY REMOVAL;  Surgeon: Abbie Sons, MD;  Location: [facility];  Service: Urology;  Laterality: N/A;   CYSTOSCOPY WITH INSERTION OF UROLIFT N/A 04/29/2019   Procedure: CYSTOSCOPY WITH INSERTION OF UROLIFT;  Surgeon: Abbie Sons, MD;  Location: [facility];  Service: Urology;  Laterality: N/A;   ESOPHAGOGASTRODUODENOSCOPY (EGD) WITH PROPOFOL N/A 09/11/2016   Procedure: ESOPHAGOGASTRODUODENOSCOPY (EGD) WITH PROPOFOL;  Surgeon: Lucilla Lame, MD;  Location: [facility] ENDOSCOPY;  Service: Endoscopy;  Laterality: N/A;   ESOPHAGOGASTRODUODENOSCOPY (EGD) WITH PROPOFOL N/A 10/17/2016   Procedure: ESOPHAGOGASTRODUODENOSCOPY (EGD) WITH PROPOFOL;  Surgeon: Lucilla Lame, MD;  Location: [facility] ENDOSCOPY;  Service: Endoscopy;  Laterality: N/A;   ESOPHAGOGASTRODUODENOSCOPY (EGD) WITH PROPOFOL N/A 11/13/2017   Procedure: ESOPHAGOGASTRODUODENOSCOPY (EGD) WITH PROPOFOL;  Surgeon: Lucilla Lame, MD;  Location: [facility] ENDOSCOPY;  Service: Endoscopy;  Laterality: N/A;   pins R lower leg Left    rotator cuff replaced Right    SAVORY DILATION N/A 10/17/2016   Procedure: SAVORY DILATION;  Surgeon: Lucilla Lame, MD;  Location: [facility] ENDOSCOPY;  Service: Endoscopy;  Laterality: N/A;   TRANSURETHRAL INCISION OF PROSTATE N/A 10/04/2021   Procedure: TRANSURETHRAL INCISION OF THE PROSTATE (TUIP);  Surgeon: Abbie Sons, MD;  Location: [facility];  Service: Urology;  Laterality: N/A;   Patient Active Problem List   Diagnosis Date Noted   History of colonic polyps    Anxiety state 11/17/2021   Cognitive deficit, post-stroke 11/17/2021   Ex-smoker 11/17/2021   Bradycardia 14/43/1540   Chronic systolic CHF (congestive heart failure), NYHA class 2 ([place]) 05/27/2020   Accelerated junctional rhythm 04/21/2019   Encounter for screening colonoscopy    Polyp of colon    Atherosclerotic heart disease of native coronary artery without angina  pectoris 01/14/2019   Depression 01/14/2019   GERD (gastroesophageal reflux disease) 01/14/2019   Periodic limb movement disorder (PLMD) 01/14/2019   Sleep apnea 01/14/2019   Type 2 diabetes mellitus without complications ([place]) 08/67/6195   Stricture and stenosis of esophagus    Problems with swallowing and mastication    Food impaction of esophagus    H/O coronary artery bypass surgery 01/17/2016   Cerebrovascular accident (CVA) due to embolism ([place]) 01/03/2016   Hypothyroidism, unspecified 01/03/2016   HLD (hyperlipidemia) 01/03/2016   Stable angina 11/29/2015   Benign essential hypertension 05/27/2015   Bilateral carotid artery stenosis 02/24/2015   Atherosclerotic peripheral vascular disease ([place]) 09/07/2014   Bladder calculus 08/12/2014   Primary osteoarthritis of left knee 03/10/2014   Elevated prostate specific antigen (PSA) 02/05/2013   Snoring 01/22/2013   Sleepiness 01/22/2013   Dysphagia, unspecified(787.20) 01/22/2013   Occlusion and stenosis of carotid artery without mention of cerebral infarction 01/22/2013   Cerebral thrombosis with cerebral infarction ([place]) 01/22/2013   Benign prostatic hyperplasia with incomplete bladder  emptying 08/26/2012   Chronic prostatitis 08/26/2012   Family history of malignant neoplasm of prostate 08/26/2012   Incomplete emptying of bladder 08/26/2012   Dysphagia 05/14/2012   Global aphasia 05/14/2012   Stroke, acute, thrombotic ([place]) 05/12/2012   Symptomatic carotid artery stenosis 05/12/2012   HTN (hypertension) 05/12/2012   COPD (chronic obstructive pulmonary disease) ([place]) 05/12/2012   Acute respiratory failure ([place]) 05/12/2012    ONSET DATE: 05/13/2012  REFERRING DIAG: D40.814 (ICD-10-CM) - Cognitive deficit, post-stroke   PERTINENT HISTORY: Pt is a 76 year old male with past medical hx of anxiety, COP, depression, diabetes mellitus without complication, HTN, hyperlipidemia.    Of note, pt received Outpatient ST services for  a total of 28 sessions ending on 03/24/2020. At the end of that POC pt required moderate assistance.      DIAGNOSTIC FINDINGS:  MRI 10/03/2019  No acute intracranial abnormality. 2. Moderate-sized chronic left MCA infarct. 3. Mild chronic small vessel ischemic disease, mildly progressed from 2014. 4. Progressive cerebral atrophy.  THERAPY DIAG:  Aphasia  Left middle cerebral artery stroke [facility])  Rationale for Evaluation and Treatment Rehabilitation  SUBJECTIVE: pt brought in his trial device, "I have been using it some"  Pt accompanied by: self  PAIN:  Are you having pain? No  PATIENT GOALS: he would like to be able to communicate better   OBJECTIVE:   TODAY'S TREATMENT: Skilled treatment session targeted pt's word finding and communication breakdown goals. SLP facilitated session by providing the following interventions:  In consultation with pt along with visual support for information reporting, SLP created the following icons to help pt gain independent communication.   Restaurant: To improve independence when ordering at his favorite restaurants, the following were created for local restaurants: [place] [employer]  Sports: Football  Who plays this weekend?  When does [place] play?  Basketball  When does [employer] play?  When does [employer] play?      PATIENT EDUCATION: Education details: SGD Person educated: Patient and wife (via text) Education method: Explanation and Handouts Education comprehension: verbalized understanding  GOALS: Goals reviewed with patient? Yes   SHORT TERM GOALS: Target date: 10 sessions  03/30/2022 - 05/16/2022 Pt will learn strategies (such as drawing, gestures) to aid during communication breakdowns.  Baseline: only using circumlocutions 05/16/2022 - Goal status: DISCONTINUED, pt unable d/t motor deficits   2.  With minimal assistance, pt will use strategies to help during communication breakdowns.   Baseline: new goal 05/16/2022 Goal status: DISCONTINUED, no viable strategies identified as successful  05/16/2022  3.  Pt will respond to general questions using AAC/nonverbal communication effectively with 50% accuracy and mod cues.  Baseline: 25% Goal Status: INITIAL  4.  Pt will use AAC to communicate within group situations with 50% accuracy given Mod to Min cues.   Baseline: 15% Goal Status: INITIAL  5.   Pt will use AAC to communicate in a structured conversation setting with 75% accuracy and min cues.  Baseline: 25% Goal Status: INITIAL     LONG TERM GOALS: Target date: 05/25/2022   03/30/2022 thru 05/16/2022 Pt will utilize strategies to aid in communication breakdowns with Mod I.  Baseline: only using circumlocutions 05/16/2022 Goal status: DISCONTINUED, no viable strategies identified as successful  05/16/2022 2.  To maximize functional communication across all communication contexts using AAC and multimodal means of communication. Baseline: 25% Goal status: INITIAL      ASSESSMENT:  CLINICAL IMPRESSION: Pt continues to be reliant on pictures to help  verbal communication. Such as looking at [facility] menu online and picking out the hamburger that he usually orders. Pt is responding well to use of device. Will fill out paperwork for permanent device.   OBJECTIVE IMPAIRMENTS include expressive language and aphasia. These impairments are limiting patient from effectively communicating at home and in community. Factors affecting potential to achieve goals and functional outcome are  time post onset (10 years) and previous ST intervention . Patient will benefit from skilled SLP services to address above impairments and improve overall function.  REHAB POTENTIAL: Fair time post onset (10 years)  PLAN: SLP FREQUENCY: 1-2x/week  SLP DURATION: 8 weeks  PLANNED INTERVENTIONS: Language facilitation, Internal/external aids, SLP instruction and feedback, Compensatory  strategies, and Patient/family education   Shawny Borkowski B. Rutherford Nail, M.S., CCC-SLP, Mining engineer Certified Brain Injury [place]  [place] Office [phone] Ascom [phone] Fax [phone]

## 2022-05-23 ENCOUNTER — Ambulatory Visit: Payer: Medicare HMO | Admitting: Speech Pathology

## 2022-05-23 DIAGNOSIS — R41841 Cognitive communication deficit: Secondary | ICD-10-CM | POA: Diagnosis not present

## 2022-05-23 DIAGNOSIS — R4701 Aphasia: Secondary | ICD-10-CM | POA: Diagnosis not present

## 2022-05-23 DIAGNOSIS — I63512 Cerebral infarction due to unspecified occlusion or stenosis of left middle cerebral artery: Secondary | ICD-10-CM | POA: Diagnosis not present

## 2022-05-23 DIAGNOSIS — R471 Dysarthria and anarthria: Secondary | ICD-10-CM | POA: Diagnosis not present

## 2022-05-23 NOTE — Therapy (Signed)
OUTPATIENT SPEECH LANGUAGE PATHOLOGY TREATMENT  RE-CERTIFICATION  Patient Name: Paul Bass MRN: [id number] DOB:1945/10/11, 76 y.o., male Today's Date: 05/23/2022  PCP: [name], NP REFERRING PROVIDER: Jennings Books, MD  END OF SESSION:   End of Session - 05/23/22 1327     Visit Number 12    Number of Visits 16    Date for SLP Re-Evaluation 08/15/22    Authorization Type [employer] HMO    Authorization Time Period 03/30/2022 thru 08/21/2022    Authorization - Visit Number 12    Authorization - Number of Visits 43    Progress Note Due on Visit 81    SLP Start Time 1100    SLP Stop Time  1145    SLP Time Calculation (min) 45 min    Activity Tolerance Patient tolerated treatment well             Past Medical History:  Diagnosis Date   Accelerated junctional rhythm    Anemia    Anxiety    a.) Tx'd with BZO PRN   Arthritis    Bilateral carotid artery disease ([name])    BPH (benign prostatic hypertrophy)    CAD S/P percutaneous coronary angioplasty    a.) PCI in 2002 placing a RCA stent (unknown type). b.) LHC 12/07/2015: 5% ISR m-dRCA, 90% dRCA, 25% pRCA, 25% p-mLCx, 100% OM3, 95% oOM2-OM2, 75% m-dLAD; refer to CVTS. c.) 3v CABG 01/03/2016   CHF (congestive heart failure) ([place])    a.) TTE 05/12/2012: EF 50-55%; mild LA enlargement; G1DD. b.)  TTE 11/26/2015: EF 45%; mild BAE; triv PR, mild MR/TR; inferolateral HK; G1DD. c.)  TTE 11/07/2017: EF 35%; RV enlargement; BAE; inferior and inferolateral HK; triv PR; mild MR/TR. d.)  TTE 09/17/2019: EF 35%; mild LVH; triv MR/TR. e.)  TTE 08/11/2021: EF 50%; triv MR/TR; G1DD.   Cognitive deficit following cerebrovascular accident (CVA)    COPD (chronic obstructive pulmonary disease) ([name])    Cortical cataract    Depression    Dyspnea    GERD (gastroesophageal reflux disease)    History of 2019 novel coronavirus disease (COVID-19) 01/19/2021   History of acute inferior wall MI 2002   a.) PCI was performed placing  a RCA stent (unknown type)   History of kidney stones    HTN (hypertension)    Hyperlipidemia    Hypothyroidism    Long term current use of antithrombotics/antiplatelets    a.) DAPT therapy (ASA + clopidogrel)   OSA (obstructive sleep apnea)    a.) does not require nocturnal PAP therapy   PLMD (periodic limb movement disorder)    Postoperative atrial fibrillation ([place]) 01/03/2016   a.) following CABG procedure   Prostatitis    PVD (peripheral vascular disease) ([place])    S/P CABG x 3 01/03/2016   a.) LIMA-LAD, SVG-PDA, SVG-OM2   Stroke ([place]) 04/2012   a.) LEFT M1 occlusion from possible moderate LEFT ICA stenosis; Tx with TPA + mechanical embolectomy with Trevo Provue retrieval device with full recanalization and proximal LEFT ICA rescue stent. b/) residual RIGHT sided weakness   T2DM (type 2 diabetes mellitus) ([place])    Tobacco abuse    Past Surgical History:  Procedure Laterality Date   CARDIAC CATHETERIZATION N/A 12/07/2015   Procedure: Left Heart Cath and Coronary Angiography;  Surgeon: Corey Skains, MD;  Location: [facility];  Service: Cardiovascular;  Laterality: N/A;   CATARACT EXTRACTION W/PHACO Left 03/24/2021   Procedure: CATARACT EXTRACTION PHACO AND INTRAOCULAR LENS PLACEMENT (  IOC) LEFT DIABETIC 6.67 00:42.5;  Surgeon: Birder Robson, MD;  Location: [facility];  Service: Ophthalmology;  Laterality: Left;   COLONOSCOPY WITH PROPOFOL N/A 02/18/2019   Procedure: COLONOSCOPY WITH PROPOFOL;  Surgeon: Lucilla Lame, MD;  Location[facility];  Service: Endoscopy;  Laterality: N/A;   COLONOSCOPY WITH PROPOFOL N/A 12/22/2021   Procedure: COLONOSCOPY WITH PROPOFOL;  Surgeon: Lucilla Lame, MD;  Location: [facility] ENDOSCOPY;  Service: Endoscopy;  Laterality: N/A;   CORONARY ANGIOPLASTY WITH STENT PLACEMENT Left 2002   CORONARY ARTERY BYPASS GRAFT N/A 01/03/2016   Procedure: 3v CORONARY ARTERY BYPASS GRAFT (LIMA-LAD, SVG-PDA, SVG-OM2); Location: [facility]; Surgeon: Dwaine Deter, MD   CYSTOSCOPY W/ URETERAL STENT REMOVAL N/A 10/04/2021   Procedure: CYSTOSCOPY WITH PROSTATE FOREIGN BODY REMOVAL;  Surgeon: Abbie Sons, MD;  Location: [facility];  Service: Urology;  Laterality: N/A;   CYSTOSCOPY WITH INSERTION OF UROLIFT N/A 04/29/2019   Procedure: CYSTOSCOPY WITH INSERTION OF UROLIFT;  Surgeon: Abbie Sons, MD;  Location: [facility];  Service: Urology;  Laterality: N/A;   ESOPHAGOGASTRODUODENOSCOPY (EGD) WITH PROPOFOL N/A 09/11/2016   Procedure: ESOPHAGOGASTRODUODENOSCOPY (EGD) WITH PROPOFOL;  Surgeon: Lucilla Lame, MD;  Location: [facility] ENDOSCOPY;  Service: Endoscopy;  Laterality: N/A;   ESOPHAGOGASTRODUODENOSCOPY (EGD) WITH PROPOFOL N/A 10/17/2016   Procedure: ESOPHAGOGASTRODUODENOSCOPY (EGD) WITH PROPOFOL;  Surgeon: Lucilla Lame, MD;  Location: [facility];  Service: Endoscopy;  Laterality: N/A;   ESOPHAGOGASTRODUODENOSCOPY (EGD) WITH PROPOFOL N/A 11/13/2017   Procedure: ESOPHAGOGASTRODUODENOSCOPY (EGD) WITH PROPOFOL;  Surgeon: Lucilla Lame, MD;  Location: [facility] ENDOSCOPY;  Service: Endoscopy;  Laterality: N/A;   pins R lower leg Left    rotator cuff replaced Right    SAVORY DILATION N/A 10/17/2016   Procedure: SAVORY DILATION;  Surgeon: Lucilla Lame, MD;  Location: [facility] ENDOSCOPY;  Service: Endoscopy;  Laterality: N/A;   TRANSURETHRAL INCISION OF PROSTATE N/A 10/04/2021   Procedure: TRANSURETHRAL INCISION OF THE PROSTATE (TUIP);  Surgeon: Abbie Sons, MD;  Location: [facility];  Service: Urology;  Laterality: N/A;   Patient Active Problem List   Diagnosis Date Noted   History of colonic polyps    Anxiety state 11/17/2021   Cognitive deficit, post-stroke 11/17/2021   Ex-smoker 11/17/2021   Bradycardia 92/42/6834   Chronic systolic CHF (congestive heart failure), NYHA class 2 ([place]) 05/27/2020   Accelerated junctional rhythm 04/21/2019   Encounter for screening colonoscopy    Polyp of colon    Atherosclerotic heart disease of native coronary artery  without angina pectoris 01/14/2019   Depression 01/14/2019   GERD (gastroesophageal reflux disease) 01/14/2019   Periodic limb movement disorder (PLMD) 01/14/2019   Sleep apnea 01/14/2019   Type 2 diabetes mellitus without complications ([place]) 19/62/2297   Stricture and stenosis of esophagus    Problems with swallowing and mastication    Food impaction of esophagus    H/O coronary artery bypass surgery 01/17/2016   Cerebrovascular accident (CVA) due to embolism ([place]) 01/03/2016   Hypothyroidism, unspecified 01/03/2016   HLD (hyperlipidemia) 01/03/2016   Stable angina 11/29/2015   Benign essential hypertension 05/27/2015   Bilateral carotid artery stenosis 02/24/2015   Atherosclerotic peripheral vascular disease ([place]) 09/07/2014   Bladder calculus 08/12/2014   Primary osteoarthritis of left knee 03/10/2014   Elevated prostate specific antigen (PSA) 02/05/2013   Snoring 01/22/2013   Sleepiness 01/22/2013   Dysphagia, unspecified(787.20) 01/22/2013   Occlusion and stenosis of carotid artery without mention of cerebral infarction 01/22/2013   Cerebral thrombosis with cerebral infarction ([place]) 01/22/2013   Benign prostatic hyperplasia with incomplete  bladder emptying 08/26/2012   Chronic prostatitis 08/26/2012   Family history of malignant neoplasm of prostate 08/26/2012   Incomplete emptying of bladder 08/26/2012   Dysphagia 05/14/2012   Global aphasia 05/14/2012   Stroke, acute, thrombotic ([place]) 05/12/2012   Symptomatic carotid artery stenosis 05/12/2012   HTN (hypertension) 05/12/2012   COPD (chronic obstructive pulmonary disease) ([place]) 05/12/2012   Acute respiratory failure ([place]) 05/12/2012    ONSET DATE: 05/13/2012  REFERRING DIAG: Q25.956 (ICD-10-CM) - Cognitive deficit, post-stroke   PERTINENT HISTORY: Pt is a 76 year old male with past medical hx of anxiety, COP, depression, diabetes mellitus without complication, HTN, hyperlipidemia.    Of note, pt received Outpatient  ST services for a total of 28 sessions ending on 03/24/2020. At the end of that POC pt required moderate assistance.      DIAGNOSTIC FINDINGS:  MRI 10/03/2019  No acute intracranial abnormality. 2. Moderate-sized chronic left MCA infarct. 3. Mild chronic small vessel ischemic disease, mildly progressed from 2014. 4. Progressive cerebral atrophy.  THERAPY DIAG:  Aphasia  Left middle cerebral artery stroke [facility])  Rationale for Evaluation and Treatment Rehabilitation  SUBJECTIVE: "I'm ready" pointing to his trial device  Pt accompanied by: self  PAIN:  Are you having pain? No  PATIENT GOALS: he would like to be able to communicate better   OBJECTIVE:   TODAY'S TREATMENT: Skilled treatment session targeted pt's word finding and communication breakdown goals. SLP facilitated session by providing the following interventions:  In consultation with pt along with visual support for information reporting, SLP created the following icons to help pt gain independent communication.   Restaurant: Added more local restaurants that he and his wife frequent Picture support provided for pt to identify the meals that he gets       PATIENT EDUCATION: Education details: SGD Person educated: Patient and wife (via text) Education method: Explanation and Handouts Education comprehension: verbalized understanding  GOALS: Goals reviewed with patient? Yes   SHORT TERM GOALS: Target date: 10 sessions  03/30/2022 - 05/16/2022 Pt will learn strategies (such as drawing, gestures) to aid during communication breakdowns.  Baseline: only using circumlocutions 05/16/2022 - Goal status: DISCONTINUED, pt unable d/t motor deficits   2.  With minimal assistance, pt will use strategies to help during communication breakdowns.  Baseline: new goal 05/16/2022 Goal status: DISCONTINUED, no viable strategies identified as successful  05/16/2022  3.  Pt will respond to general questions using  AAC/nonverbal communication effectively with 50% accuracy and mod cues.  Baseline: 25% Goal Status: INITIAL  4.  Pt will use AAC to communicate within group situations with 50% accuracy given Mod to Min cues.   Baseline: 15% Goal Status: INITIAL  5.   Pt will use AAC to communicate in a structured conversation setting with 75% accuracy and min cues.  Baseline: 25% Goal Status: INITIAL     LONG TERM GOALS: Target date: 08/15/2022   03/30/2022 thru 05/16/2022 Pt will utilize strategies to aid in communication breakdowns with Mod I.  Baseline: only using circumlocutions 05/16/2022 Goal status: DISCONTINUED, no viable strategies identified as successful  05/16/2022 2.  To maximize functional communication across all communication contexts using AAC and multimodal means of communication. Baseline: 25% Goal status: INITIAL      ASSESSMENT:  CLINICAL IMPRESSION: Pt continues to be reliant on pictures to help verbal communication. Such as looking at [facility] menu online and picking out the hamburger that he usually orders. Pt is responding well to use of device. Will  request re-certification to continue to train pt and his wife in use of device.   OBJECTIVE IMPAIRMENTS include expressive language and aphasia. These impairments are limiting patient from effectively communicating at home and in community. Factors affecting potential to achieve goals and functional outcome are  time post onset (10 years) and previous ST intervention . Patient will benefit from skilled SLP services to address above impairments and improve overall function.  REHAB POTENTIAL: Fair time post onset (10 years)  PLAN: SLP FREQUENCY: 1-2x/week  SLP DURATION: 8 weeks  PLANNED INTERVENTIONS: Language facilitation, Internal/external aids, SLP instruction and feedback, Compensatory strategies, and Patient/family education   Bulah Lurie B. Rutherford Nail, M.S., CCC-SLP, Mining engineer Certified Brain  Injury [place]  [place] Office [phone] Ascom [phone] Fax [phone]

## 2022-05-25 ENCOUNTER — Telehealth (INDEPENDENT_AMBULATORY_CARE_PROVIDER_SITE_OTHER): Payer: Self-pay

## 2022-05-25 ENCOUNTER — Ambulatory Visit: Payer: Medicare HMO | Admitting: Speech Pathology

## 2022-05-25 NOTE — Telephone Encounter (Signed)
Spoke with the patient's spouse and he has been rescheduled from 06/01/22 to 06/08/22 for a right carotid angio possible stent with Dr. Lucky Cowboy. Pre-procedure instructions will be mailed.

## 2022-05-29 ENCOUNTER — Encounter (INDEPENDENT_AMBULATORY_CARE_PROVIDER_SITE_OTHER): Payer: Self-pay

## 2022-05-29 ENCOUNTER — Ambulatory Visit: Payer: Medicare HMO | Admitting: Speech Pathology

## 2022-05-31 ENCOUNTER — Ambulatory Visit: Payer: Medicare HMO | Attending: Neurology | Admitting: Speech Pathology

## 2022-05-31 DIAGNOSIS — I63512 Cerebral infarction due to unspecified occlusion or stenosis of left middle cerebral artery: Secondary | ICD-10-CM | POA: Insufficient documentation

## 2022-05-31 DIAGNOSIS — R482 Apraxia: Secondary | ICD-10-CM | POA: Diagnosis not present

## 2022-05-31 DIAGNOSIS — R4701 Aphasia: Secondary | ICD-10-CM | POA: Diagnosis not present

## 2022-06-01 DIAGNOSIS — I6523 Occlusion and stenosis of bilateral carotid arteries: Secondary | ICD-10-CM

## 2022-06-05 ENCOUNTER — Encounter: Payer: Self-pay | Admitting: Vascular Surgery

## 2022-06-05 ENCOUNTER — Inpatient Hospital Stay
Admission: AD | Admit: 2022-06-05 | Discharge: 2022-06-06 | DRG: 254 | Disposition: A | Discharge status: Home / Self Care | Payer: Medicare HMO | Source: Ambulatory Visit | Attending: Vascular Surgery | Admitting: Vascular Surgery

## 2022-06-05 ENCOUNTER — Encounter: Payer: Medicare HMO | Admitting: Speech Pathology

## 2022-06-05 ENCOUNTER — Other Ambulatory Visit: Payer: Self-pay

## 2022-06-05 ENCOUNTER — Encounter
Admission: AD | Disposition: A | Discharge status: Home / Self Care | Payer: Self-pay | Source: Ambulatory Visit | Attending: Vascular Surgery

## 2022-06-05 DIAGNOSIS — T82898A Other specified complication of vascular prosthetic devices, implants and grafts, initial encounter: Secondary | ICD-10-CM

## 2022-06-05 DIAGNOSIS — I2510 Atherosclerotic heart disease of native coronary artery without angina pectoris: Secondary | ICD-10-CM | POA: Diagnosis present

## 2022-06-05 DIAGNOSIS — Z87442 Personal history of urinary calculi: Secondary | ICD-10-CM | POA: Diagnosis not present

## 2022-06-05 DIAGNOSIS — I6523 Occlusion and stenosis of bilateral carotid arteries: Secondary | ICD-10-CM | POA: Diagnosis not present

## 2022-06-05 DIAGNOSIS — Z951 Presence of aortocoronary bypass graft: Secondary | ICD-10-CM | POA: Diagnosis not present

## 2022-06-05 DIAGNOSIS — E785 Hyperlipidemia, unspecified: Secondary | ICD-10-CM | POA: Diagnosis present

## 2022-06-05 DIAGNOSIS — T82858A Stenosis of vascular prosthetic devices, implants and grafts, initial encounter: Secondary | ICD-10-CM | POA: Diagnosis not present

## 2022-06-05 DIAGNOSIS — Z8249 Family history of ischemic heart disease and other diseases of the circulatory system: Secondary | ICD-10-CM | POA: Diagnosis not present

## 2022-06-05 DIAGNOSIS — Z7984 Long term (current) use of oral hypoglycemic drugs: Secondary | ICD-10-CM

## 2022-06-05 DIAGNOSIS — N400 Enlarged prostate without lower urinary tract symptoms: Secondary | ICD-10-CM | POA: Diagnosis present

## 2022-06-05 DIAGNOSIS — E039 Hypothyroidism, unspecified: Secondary | ICD-10-CM | POA: Diagnosis not present

## 2022-06-05 DIAGNOSIS — I10 Essential (primary) hypertension: Secondary | ICD-10-CM | POA: Diagnosis present

## 2022-06-05 DIAGNOSIS — Z8616 Personal history of COVID-19: Secondary | ICD-10-CM | POA: Diagnosis not present

## 2022-06-05 DIAGNOSIS — Z79899 Other long term (current) drug therapy: Secondary | ICD-10-CM | POA: Diagnosis not present

## 2022-06-05 DIAGNOSIS — I252 Old myocardial infarction: Secondary | ICD-10-CM

## 2022-06-05 DIAGNOSIS — Z808 Family history of malignant neoplasm of other organs or systems: Secondary | ICD-10-CM | POA: Diagnosis not present

## 2022-06-05 DIAGNOSIS — T82856A Stenosis of peripheral vascular stent, initial encounter: Secondary | ICD-10-CM | POA: Diagnosis not present

## 2022-06-05 DIAGNOSIS — Z9861 Coronary angioplasty status: Secondary | ICD-10-CM | POA: Diagnosis not present

## 2022-06-05 DIAGNOSIS — Z7902 Long term (current) use of antithrombotics/antiplatelets: Secondary | ICD-10-CM | POA: Diagnosis not present

## 2022-06-05 DIAGNOSIS — Z7951 Long term (current) use of inhaled steroids: Secondary | ICD-10-CM | POA: Diagnosis not present

## 2022-06-05 DIAGNOSIS — J449 Chronic obstructive pulmonary disease, unspecified: Secondary | ICD-10-CM | POA: Diagnosis not present

## 2022-06-05 DIAGNOSIS — G4733 Obstructive sleep apnea (adult) (pediatric): Secondary | ICD-10-CM | POA: Diagnosis present

## 2022-06-05 DIAGNOSIS — Z87891 Personal history of nicotine dependence: Secondary | ICD-10-CM

## 2022-06-05 DIAGNOSIS — I69320 Aphasia following cerebral infarction: Secondary | ICD-10-CM

## 2022-06-05 DIAGNOSIS — Z961 Presence of intraocular lens: Secondary | ICD-10-CM | POA: Diagnosis present

## 2022-06-05 DIAGNOSIS — E1151 Type 2 diabetes mellitus with diabetic peripheral angiopathy without gangrene: Secondary | ICD-10-CM | POA: Diagnosis not present

## 2022-06-05 DIAGNOSIS — F419 Anxiety disorder, unspecified: Secondary | ICD-10-CM | POA: Diagnosis present

## 2022-06-05 DIAGNOSIS — I63239 Cerebral infarction due to unspecified occlusion or stenosis of unspecified carotid arteries: Principal | ICD-10-CM | POA: Diagnosis present

## 2022-06-05 DIAGNOSIS — I6522 Occlusion and stenosis of left carotid artery: Secondary | ICD-10-CM

## 2022-06-05 DIAGNOSIS — Z79890 Hormone replacement therapy (postmenopausal): Secondary | ICD-10-CM | POA: Diagnosis not present

## 2022-06-05 DIAGNOSIS — Z833 Family history of diabetes mellitus: Secondary | ICD-10-CM

## 2022-06-05 DIAGNOSIS — I69319 Unspecified symptoms and signs involving cognitive functions following cerebral infarction: Secondary | ICD-10-CM | POA: Diagnosis not present

## 2022-06-05 DIAGNOSIS — Z9842 Cataract extraction status, left eye: Secondary | ICD-10-CM

## 2022-06-05 DIAGNOSIS — Y838 Other surgical procedures as the cause of abnormal reaction of the patient, or of later complication, without mention of misadventure at the time of the procedure: Secondary | ICD-10-CM | POA: Diagnosis present

## 2022-06-05 HISTORY — PX: CAROTID ANGIOGRAPHY: CATH118230

## 2022-06-05 LAB — GLUCOSE, CAPILLARY
Glucose-Capillary: 152 mg/dL — ABNORMAL HIGH (ref 70–99)
Glucose-Capillary: 166 mg/dL — ABNORMAL HIGH (ref 70–99)
Glucose-Capillary: 175 mg/dL — ABNORMAL HIGH (ref 70–99)
Glucose-Capillary: 137 mg/dL — ABNORMAL HIGH (ref 70–99)

## 2022-06-05 LAB — MRSA NEXT GEN BY PCR, NASAL: MRSA by PCR Next Gen: NOT DETECTED

## 2022-06-05 LAB — BUN: BUN: 20 mg/dL (ref 8–23)

## 2022-06-05 LAB — CREATININE, SERUM
Creatinine, Ser: 1.14 mg/dL (ref 0.61–1.24)
GFR, Estimated: >60 mL/min (ref >60)

## 2022-06-05 LAB — POCT ACTIVATED CLOTTING TIME: Activated Clotting Time: 233 seconds

## 2022-06-05 SURGERY — CAROTID ANGIOGRAPHY
Anesthesia: Moderate Sedation

## 2022-06-05 MED ORDER — VITAMIN D 25 MCG (1000 UNIT) PO TABS
1000.0000 [IU] | ORAL_TABLET | Freq: Every evening | ORAL | Status: DC
  Administered 2022-06-05: 1000 [IU] via ORAL
  Filled 2022-06-05: qty 1

## 2022-06-05 MED ORDER — METOPROLOL TARTRATE 5 MG/5ML IV SOLN: 2.0000 mg | INTRAVENOUS | Status: DC | PRN

## 2022-06-05 MED ORDER — GUAIFENESIN-DM 100-10 MG/5ML PO SYRP: 15.0000 mL | ORAL_SOLUTION | ORAL | Status: DC | PRN

## 2022-06-05 MED ORDER — HEPARIN SODIUM (PORCINE) 1000 UNIT/ML IJ SOLN
INTRAMUSCULAR | Status: AC
  Filled 2022-06-05: qty 10

## 2022-06-05 MED ORDER — LABETALOL HCL 5 MG/ML IV SOLN: 10.0000 mg | INTRAVENOUS | Status: DC | PRN

## 2022-06-05 MED ORDER — PHENYLEPHRINE 80 MCG/ML (10ML) SYRINGE FOR IV PUSH (FOR BLOOD PRESSURE SUPPORT)
PREFILLED_SYRINGE | INTRAVENOUS | Status: AC
  Filled 2022-06-05: qty 10

## 2022-06-05 MED ORDER — ONDANSETRON HCL 4 MG/2ML IJ SOLN: 4.0000 mg | Freq: Four times a day (QID) | INTRAMUSCULAR | Status: DC | PRN

## 2022-06-05 MED ORDER — ATROPINE SULFATE 1 MG/10ML IJ SOSY
PREFILLED_SYRINGE | INTRAMUSCULAR | Status: AC
  Filled 2022-06-05: qty 10

## 2022-06-05 MED ORDER — MORPHINE SULFATE (PF) 4 MG/ML IV SOLN: 2.0000 mg | INTRAVENOUS | Status: DC | PRN

## 2022-06-05 MED ORDER — ACETAMINOPHEN 325 MG PO TABS: 325.0000 mg | ORAL_TABLET | ORAL | Status: DC | PRN

## 2022-06-05 MED ORDER — SODIUM CHLORIDE 0.9 % IV SOLN: INTRAVENOUS | Status: DC

## 2022-06-05 MED ORDER — CEFAZOLIN SODIUM-DEXTROSE 2-4 GM/100ML-% IV SOLN
INTRAVENOUS | Status: AC
  Administered 2022-06-05: 2 g via INTRAVENOUS
  Filled 2022-06-05: qty 100

## 2022-06-05 MED ORDER — VENLAFAXINE HCL ER 75 MG PO CP24
150.0000 mg | ORAL_CAPSULE | Freq: Every day | ORAL | Status: DC
  Administered 2022-06-06: 150 mg via ORAL
  Filled 2022-06-05: qty 2

## 2022-06-05 MED ORDER — CHLORHEXIDINE GLUCONATE CLOTH 2 % EX PADS
6.0000 | MEDICATED_PAD | Freq: Every day | CUTANEOUS | Status: DC
  Administered 2022-06-05: 6 via TOPICAL

## 2022-06-05 MED ORDER — METHYLPREDNISOLONE SODIUM SUCC 125 MG IJ SOLR: 125.0000 mg | Freq: Once | INTRAMUSCULAR | Status: DC | PRN

## 2022-06-05 MED ORDER — HYDROMORPHONE HCL 1 MG/ML IJ SOLN: 1.0000 mg | Freq: Once | INTRAMUSCULAR | Status: DC | PRN

## 2022-06-05 MED ORDER — ATORVASTATIN CALCIUM 20 MG PO TABS
80.0000 mg | ORAL_TABLET | Freq: Every day | ORAL | Status: DC
  Administered 2022-06-06: 80 mg via ORAL
  Filled 2022-06-05: qty 4

## 2022-06-05 MED ORDER — FAMOTIDINE 20 MG PO TABS: 40.0000 mg | ORAL_TABLET | Freq: Once | ORAL | Status: DC | PRN

## 2022-06-05 MED ORDER — POTASSIUM CHLORIDE CRYS ER 20 MEQ PO TBCR: 20.0000 meq | EXTENDED_RELEASE_TABLET | Freq: Every day | ORAL | Status: DC | PRN

## 2022-06-05 MED ORDER — MOMETASONE FURO-FORMOTEROL FUM 200-5 MCG/ACT IN AERO: 2.0000 | INHALATION_SPRAY | Freq: Two times a day (BID) | RESPIRATORY_TRACT | Status: DC

## 2022-06-05 MED ORDER — RA KRILL OIL 500 MG PO CAPS: 500.0000 mg | ORAL_CAPSULE | Freq: Every day | ORAL | Status: DC

## 2022-06-05 MED ORDER — MIDAZOLAM HCL 5 MG/5ML IJ SOLN
INTRAMUSCULAR | Status: AC
  Filled 2022-06-05: qty 5

## 2022-06-05 MED ORDER — BUPROPION HCL ER (XL) 150 MG PO TB24: 150.0000 mg | ORAL_TABLET | Freq: Every day | ORAL | Status: DC

## 2022-06-05 MED ORDER — PHENYLEPHRINE HCL-NACL 20-0.9 MG/250ML-% IV SOLN
INTRAVENOUS | Status: AC
  Filled 2022-06-05: qty 250

## 2022-06-05 MED ORDER — ACETAMINOPHEN 325 MG RE SUPP: 325.0000 mg | RECTAL | Status: DC | PRN

## 2022-06-05 MED ORDER — MAGNESIUM SULFATE 2 GM/50ML IV SOLN: 2.0000 g | Freq: Every day | INTRAVENOUS | Status: DC | PRN

## 2022-06-05 MED ORDER — INSULIN ASPART 100 UNIT/ML IJ SOLN
0.0000 [IU] | Freq: Three times a day (TID) | INTRAMUSCULAR | Status: DC
  Administered 2022-06-06 (×2): 3 [IU] via SUBCUTANEOUS
  Filled 2022-06-05 (×2): qty 1

## 2022-06-05 MED ORDER — DOPAMINE-DEXTROSE 3.2-5 MG/ML-% IV SOLN
INTRAVENOUS | Status: AC
  Filled 2022-06-05: qty 250

## 2022-06-05 MED ORDER — ASPIRIN 81 MG PO TBEC
81.0000 mg | DELAYED_RELEASE_TABLET | Freq: Every day | ORAL | Status: DC
  Administered 2022-06-06: 81 mg via ORAL
  Filled 2022-06-05: qty 1

## 2022-06-05 MED ORDER — MIDAZOLAM HCL 2 MG/2ML IJ SOLN
INTRAMUSCULAR | Status: DC | PRN
  Administered 2022-06-05: 2 mg via INTRAVENOUS

## 2022-06-05 MED ORDER — HYDRALAZINE HCL 20 MG/ML IJ SOLN: 5.0000 mg | INTRAMUSCULAR | Status: DC | PRN

## 2022-06-05 MED ORDER — ORAL CARE MOUTH RINSE: 15.0000 mL | OROMUCOSAL | Status: DC | PRN

## 2022-06-05 MED ORDER — MIDAZOLAM HCL 2 MG/ML PO SYRP: 8.0000 mg | ORAL_SOLUTION | Freq: Once | ORAL | Status: DC | PRN

## 2022-06-05 MED ORDER — ADULT MULTIVITAMIN W/MINERALS CH
1.0000 | ORAL_TABLET | Freq: Every day | ORAL | Status: DC
  Administered 2022-06-06: 1 via ORAL
  Filled 2022-06-05: qty 1

## 2022-06-05 MED ORDER — CEFAZOLIN SODIUM-DEXTROSE 2-4 GM/100ML-% IV SOLN: 2.0000 g | INTRAVENOUS | Status: AC

## 2022-06-05 MED ORDER — METOPROLOL TARTRATE 25 MG PO TABS
25.0000 mg | ORAL_TABLET | Freq: Two times a day (BID) | ORAL | Status: DC
  Administered 2022-06-06: 25 mg via ORAL
  Filled 2022-06-05: qty 1

## 2022-06-05 MED ORDER — ATROPINE SULFATE 1 MG/10ML IJ SOSY
PREFILLED_SYRINGE | INTRAMUSCULAR | Status: DC | PRN
  Administered 2022-06-05: 1 mg via INTRAVENOUS

## 2022-06-05 MED ORDER — ALUM & MAG HYDROXIDE-SIMETH 200-200-20 MG/5ML PO SUSP: 15.0000 mL | ORAL | Status: DC | PRN

## 2022-06-05 MED ORDER — CEFAZOLIN SODIUM-DEXTROSE 2-4 GM/100ML-% IV SOLN
2.0000 g | Freq: Three times a day (TID) | INTRAVENOUS | Status: AC
  Administered 2022-06-05 (×2): 2 g via INTRAVENOUS
  Filled 2022-06-05 (×2): qty 100

## 2022-06-05 MED ORDER — ALBUTEROL SULFATE (2.5 MG/3ML) 0.083% IN NEBU: 3.0000 mL | INHALATION_SOLUTION | RESPIRATORY_TRACT | Status: DC | PRN

## 2022-06-05 MED ORDER — SODIUM CHLORIDE 0.9 % IV SOLN: 500.0000 mL | Freq: Once | INTRAVENOUS | Status: DC | PRN

## 2022-06-05 MED ORDER — OXYCODONE-ACETAMINOPHEN 5-325 MG PO TABS: 1.0000 | ORAL_TABLET | ORAL | Status: DC | PRN

## 2022-06-05 MED ORDER — PSYLLIUM 95 % PO PACK: 1.0000 | PACK | Freq: Every day | ORAL | Status: DC

## 2022-06-05 MED ORDER — HEPARIN SODIUM (PORCINE) 1000 UNIT/ML IJ SOLN
INTRAMUSCULAR | Status: DC | PRN
  Administered 2022-06-05: 8000 [IU] via INTRAVENOUS
  Administered 2022-06-05: 2000 [IU] via INTRAVENOUS

## 2022-06-05 MED ORDER — CLONAZEPAM 0.5 MG PO TABS
0.5000 mg | ORAL_TABLET | Freq: Two times a day (BID) | ORAL | Status: DC
  Administered 2022-06-05 – 2022-06-06 (×3): 0.5 mg via ORAL
  Filled 2022-06-05 (×3): qty 1

## 2022-06-05 MED ORDER — FENTANYL CITRATE (PF) 100 MCG/2ML IJ SOLN
INTRAMUSCULAR | Status: DC | PRN
  Administered 2022-06-05: 50 ug via INTRAVENOUS

## 2022-06-05 MED ORDER — CLOPIDOGREL BISULFATE 75 MG PO TABS
75.0000 mg | ORAL_TABLET | Freq: Every day | ORAL | Status: DC
  Administered 2022-06-06: 75 mg via ORAL
  Filled 2022-06-05: qty 1

## 2022-06-05 MED ORDER — PHENOL 1.4 % MT LIQD: 1.0000 | OROMUCOSAL | Status: DC | PRN

## 2022-06-05 MED ORDER — COQ10 100 MG PO CAPS: 100.0000 mg | ORAL_CAPSULE | Freq: Every day | ORAL | Status: DC

## 2022-06-05 MED ORDER — FENTANYL CITRATE (PF) 100 MCG/2ML IJ SOLN
INTRAMUSCULAR | Status: AC
  Filled 2022-06-05: qty 2

## 2022-06-05 MED ORDER — FAMOTIDINE IN NACL 20-0.9 MG/50ML-% IV SOLN
20.0000 mg | Freq: Two times a day (BID) | INTRAVENOUS | Status: DC
  Administered 2022-06-05 (×2): 20 mg via INTRAVENOUS
  Filled 2022-06-05 (×2): qty 50

## 2022-06-05 MED ORDER — TAMSULOSIN HCL 0.4 MG PO CAPS
0.4000 mg | ORAL_CAPSULE | Freq: Every day | ORAL | Status: DC
  Administered 2022-06-06: 0.4 mg via ORAL
  Filled 2022-06-05: qty 1

## 2022-06-05 MED ORDER — PANTOPRAZOLE SODIUM 40 MG PO TBEC
40.0000 mg | DELAYED_RELEASE_TABLET | Freq: Every day | ORAL | Status: DC
  Administered 2022-06-05: 40 mg via ORAL
  Filled 2022-06-05: qty 1

## 2022-06-05 MED ORDER — INSULIN ASPART 100 UNIT/ML IJ SOLN: 0.0000 [IU] | Freq: Every day | INTRAMUSCULAR | Status: DC

## 2022-06-05 MED ORDER — DIPHENHYDRAMINE HCL 50 MG/ML IJ SOLN: 50.0000 mg | Freq: Once | INTRAMUSCULAR | Status: DC | PRN

## 2022-06-05 MED ORDER — LEVOTHYROXINE SODIUM 100 MCG PO TABS
100.0000 ug | ORAL_TABLET | Freq: Every day | ORAL | Status: DC
  Administered 2022-06-06: 100 ug via ORAL
  Filled 2022-06-05: qty 1

## 2022-06-05 SURGICAL SUPPLY — 23 items
BALLN VIATRAC 5X30X135 (BALLOONS) ×1
BALLOON VIATRAC 5X30X135 (BALLOONS) IMPLANT
CATH ANGIO 5F PIGTAIL 100CM (CATHETERS) IMPLANT
CATH BEACON 5 .035 100 JB2 TIP (CATHETERS) IMPLANT
CATH BERNSTEIN 5FR 130CM (CATHETERS) IMPLANT
CATH G 5FX100 (CATHETERS) IMPLANT
CATH HEADHUNTER H1 5F 100CM (CATHETERS) IMPLANT
COVER DRAPE FLUORO 36X44 (DRAPES) IMPLANT
DEVICE EMBOSHIELD NAV6 4.0-7.0 (FILTER) IMPLANT
DEVICE SAFEGUARD 24CM (GAUZE/BANDAGES/DRESSINGS) IMPLANT
DEVICE STARCLOSE SE CLOSURE (Vascular Products) IMPLANT
DEVICE TORQUE (MISCELLANEOUS) IMPLANT
GLIDEWIRE ANGLED SS 035X260CM (WIRE) IMPLANT
GUIDEWIRE VASC STIFF .038X260 (WIRE) IMPLANT
KIT CAROTID MANIFOLD (MISCELLANEOUS) IMPLANT
KIT ENCORE 26 ADVANTAGE (KITS) IMPLANT
PACK ANGIOGRAPHY (CUSTOM PROCEDURE TRAY) ×1 IMPLANT
SHEATH BRITE TIP 6FRX11 (SHEATH) IMPLANT
SHEATH SHUTTLE SELECT 6F (SHEATH) IMPLANT
STENT XACT CAR 9-7X40X136 (Permanent Stent) IMPLANT
SYR MEDRAD MARK 7 150ML (SYRINGE) IMPLANT
WIRE BAREWIRE WORK .014X315CM (WIRE) IMPLANT
WIRE GUIDERIGHT .035X150 (WIRE) IMPLANT

## 2022-06-05 NOTE — Therapy (Signed)
OUTPATIENT SPEECH LANGUAGE PATHOLOGY TREATMENT  RE-CERTIFICATION  Patient Name: Paul Bass MRN: [id number] DOB:06/19/1946, 76 y.o., male Today's Date: 06/05/2022  PCP: [name], NP REFERRING PROVIDER: Jennings Books, MD  END OF SESSION:   End of Session - 06/05/22 1233     Visit Number 13    Number of Visits 58    Date for SLP Re-Evaluation 08/15/22    Authorization Type [employer] HMO    Authorization Time Period 03/30/2022 thru 08/21/2022    Authorization - Visit Number 47    Authorization - Number of Visits 43    Progress Note Due on Visit 43    SLP Start Time 0800    SLP Stop Time  0900    SLP Time Calculation (min) 60 min    Activity Tolerance Patient tolerated treatment well             Past Medical History:  Diagnosis Date   Accelerated junctional rhythm    Anemia    Anxiety    a.) Tx'd with BZO PRN   Arthritis    Bilateral carotid artery disease ([name])    BPH (benign prostatic hypertrophy)    CAD S/P percutaneous coronary angioplasty    a.) PCI in 2002 placing a RCA stent (unknown type). b.) LHC 12/07/2015: 5% ISR m-dRCA, 90% dRCA, 25% pRCA, 25% p-mLCx, 100% OM3, 95% oOM2-OM2, 75% m-dLAD; refer to CVTS. c.) 3v CABG 01/03/2016   CHF (congestive heart failure) (Paul Bass)    a.) TTE 05/12/2012: EF 50-55%; mild LA enlargement; G1DD. b.)  TTE 11/26/2015: EF 45%; mild BAE; triv PR, mild MR/TR; inferolateral HK; G1DD. c.)  TTE 11/07/2017: EF 35%; RV enlargement; BAE; inferior and inferolateral HK; triv PR; mild MR/TR. d.)  TTE 09/17/2019: EF 35%; mild LVH; triv MR/TR. e.)  TTE 08/11/2021: EF 50%; triv MR/TR; G1DD.   Cognitive deficit following cerebrovascular accident (CVA)    COPD (chronic obstructive pulmonary disease) ([name])    Cortical cataract    Depression    Dyspnea    GERD (gastroesophageal reflux disease)    History of 2019 novel coronavirus disease (COVID-19) 01/19/2021   History of acute inferior wall MI 2002   a.) PCI was performed placing a  RCA stent (unknown type)   History of kidney stones    HTN (hypertension)    Hyperlipidemia    Hypothyroidism    Long term current use of antithrombotics/antiplatelets    a.) DAPT therapy (ASA + clopidogrel)   OSA (obstructive sleep apnea)    a.) does not require nocturnal PAP therapy   PLMD (periodic limb movement disorder)    Postoperative atrial fibrillation (Paul Bass) 01/03/2016   a.) following CABG procedure   Prostatitis    PVD (peripheral vascular disease) (Paul Bass)    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 b/) residual RIGHT sided weakness   T2DM (type 2 diabetes mellitus) (Paul Bass)    Tobacco abuse    Past Surgical History:  Procedure Laterality Date   CARDIAC CATHETERIZATION N/A 12/07/2015   Procedure: Left Heart Cath and Coronary Angiography;  Surgeon: Corey Skains, MD;  Location: [facility];  Service: Cardiovascular;  Laterality: N/A;   CAROTID PTA/STENT INTERVENTION Left    HX: 2 stents   CATARACT EXTRACTION W/PHACO  Left 03/24/2021   Procedure: CATARACT EXTRACTION PHACO AND INTRAOCULAR LENS PLACEMENT (IOC) LEFT DIABETIC 6.67 00:42.5;  Surgeon: Birder Robson, MD;  Location: [facility] ORS;  Service: Ophthalmology;  Laterality: Left;   COLONOSCOPY WITH PROPOFOL N/A 02/18/2019   Procedure: COLONOSCOPY WITH PROPOFOL;  Surgeon: Lucilla Lame, MD;  Location: [facility] ENDOSCOPY;  Service: Endoscopy;  Laterality: N/A;   COLONOSCOPY WITH PROPOFOL N/A 12/22/2021   Procedure: COLONOSCOPY WITH PROPOFOL;  Surgeon: Lucilla Lame, MD;  Location: [facility] ENDOSCOPY;  Service: Endoscopy;  Laterality: N/A;   CORONARY ANGIOPLASTY WITH STENT PLACEMENT Left 2002   CORONARY ARTERY BYPASS GRAFT N/A 01/03/2016   Procedure: 3v CORONARY ARTERY BYPASS GRAFT (LIMA-LAD,  SVG-PDA, SVG-OM2); Location: [facility]; Surgeon: Dwaine Deter, MD   CYSTOSCOPY W/ URETERAL STENT REMOVAL N/A 10/04/2021   Procedure: CYSTOSCOPY WITH PROSTATE FOREIGN BODY REMOVAL;  Surgeon: Abbie Sons, MD;  Location: [facility];  Service: Urology;  Laterality: N/A;   CYSTOSCOPY WITH INSERTION OF UROLIFT N/A 04/29/2019   Procedure: CYSTOSCOPY WITH INSERTION OF UROLIFT;  Surgeon: Abbie Sons, MD;  Location: [facility] OR[facility];  Service: Urology;  Laterality: N/A;   ESOPHAGOGASTRODUODENOSCOPY (EGD) WITH PROPOFOL N/A 09/11/2016   Procedure: ESOPHAGOGASTRODUODENOSCOPY (EGD) WITH PROPOFOL;  Surgeon: Lucilla Lame, MD;  Location: [facility] ENDOSCOPY;  Service: Endoscopy;  Laterality: N/A;   ESOPHAGOGASTRODUODENOSCOPY (EGD) WITH PROPOFOL N/A 10/17/2016   Procedure: ESOPHAGOGASTRODUODENOSCOPY (EGD) WITH PROPOFOL;  Surgeon: Lucilla Lame, MD;  Location: [facility] ENDOSCOPY;  Service: Endoscopy;  Laterality: N/A;   ESOPHAGOGASTRODUODENOSCOPY (EGD) WITH PROPOFOL N/A 11/13/2017   Procedure: ESOPHAGOGASTRODUODENOSCOPY (EGD) WITH PROPOFOL;  Surgeon: Lucilla Lame, MD;  Location: [facility] ENDOSCOPY;  Service: Endoscopy;  Laterality: N/A;   pins R lower leg Left    rotator cuff replaced Right    SAVORY DILATION N/A 10/17/2016   Procedure: SAVORY DILATION;  Surgeon: Lucilla Lame, MD;  Location: [facility] ENDOSCOPY;  Service: Endoscopy;  Laterality: N/A;   TRANSURETHRAL INCISION OF PROSTATE N/A 10/04/2021   Procedure: TRANSURETHRAL INCISION OF THE PROSTATE (TUIP);  Surgeon: Abbie Sons, MD;  Location: [facility] OR[facility];  Service: Urology;  Laterality: N/A;   Patient Active Problem List   Diagnosis Date Noted   Carotid stenosis, symptomatic, with infarction ([place]) 06/05/2022   History of colonic polyps    Anxiety state 11/17/2021   Cognitive deficit, post-stroke 11/17/2021   Ex-smoker 11/17/2021   Bradycardia 16/01/3709   Chronic systolic CHF (congestive heart failure), NYHA class 2 ([place]) 05/27/2020   Accelerated junctional rhythm 04/21/2019    Encounter for screening colonoscopy    Polyp of colon    Atherosclerotic heart disease of native coronary artery without angina pectoris 01/14/2019   Depression 01/14/2019   GERD (gastroesophageal reflux disease) 01/14/2019   Periodic limb movement disorder (PLMD) 01/14/2019   Sleep apnea 01/14/2019   Type 2 diabetes mellitus without complications ([place]) 62/69/4854   Stricture and stenosis of esophagus    Problems with swallowing and mastication    Food impaction of esophagus    H/O coronary artery bypass surgery 01/17/2016   Cerebrovascular accident (CVA) due to embolism ([place]) 01/03/2016   Hypothyroidism, unspecified 01/03/2016   HLD (hyperlipidemia) 01/03/2016   Stable angina 11/29/2015   Benign essential hypertension 05/27/2015   Bilateral carotid artery stenosis 02/24/2015   Atherosclerotic peripheral vascular disease ([place]) 09/07/2014   Bladder calculus 08/12/2014   Primary osteoarthritis of left knee 03/10/2014   Elevated prostate specific antigen (PSA) 02/05/2013   Snoring 01/22/2013   Sleepiness 01/22/2013   Dysphagia, unspecified(787.20) 01/22/2013   Occlusion and stenosis of carotid artery without  mention of cerebral infarction 01/22/2013   Cerebral thrombosis with cerebral infarction ([place]) 01/22/2013   Benign prostatic hyperplasia with incomplete bladder emptying 08/26/2012   Chronic prostatitis 08/26/2012   Family history of malignant neoplasm of prostate 08/26/2012   Incomplete emptying of bladder 08/26/2012   Dysphagia 05/14/2012   Global aphasia 05/14/2012   Stroke, acute, thrombotic ([place]) 05/12/2012   Symptomatic carotid artery stenosis 05/12/2012   HTN (hypertension) 05/12/2012   COPD (chronic obstructive pulmonary disease) ([place]) 05/12/2012   Acute respiratory failure ([place]) 05/12/2012    ONSET DATE: 05/13/2012  REFERRING DIAG: L57.262 (ICD-10-CM) - Cognitive deficit, post-stroke   PERTINENT HISTORY: Pt is a 76 year old male with past medical hx of  anxiety, COP, depression, diabetes mellitus without complication, HTN, hyperlipidemia.    Of note, pt received Outpatient ST services for a total of 28 sessions ending on 03/24/2020. At the end of that POC pt required moderate assistance.      DIAGNOSTIC FINDINGS:  MRI 10/03/2019  No acute intracranial abnormality. 2. Moderate-sized chronic left MCA infarct. 3. Mild chronic small vessel ischemic disease, mildly progressed from 2014. 4. Progressive cerebral atrophy.  THERAPY DIAG:  Aphasia  Apraxia  Left middle cerebral artery stroke [facility])  Rationale for Evaluation and Treatment Rehabilitation  SUBJECTIVE: "I'm ready" pointing to his trial device  Pt accompanied by: self  PAIN:  Are you having pain? No  PATIENT GOALS: he would like to be able to communicate better   OBJECTIVE:   TODAY'S TREATMENT: Skilled treatment session targeted pt's word finding and communication breakdown goals. SLP facilitated session by providing the following interventions:  SLP constructed a "navigational guide" with pictures of icons/displays for ease of locating information.   With maximal faded to moderate assistance, pt able to use guide to locate information with 50% ability.       PATIENT EDUCATION: Education details: SGD Person educated: Patient and wife (via text) Education method: Explanation and Handouts Education comprehension: verbalized understanding  GOALS: Goals reviewed with patient? Yes   SHORT TERM GOALS: Target date: 10 sessions  03/30/2022 - 05/16/2022 Pt will learn strategies (such as drawing, gestures) to aid during communication breakdowns.  Baseline: only using circumlocutions 05/16/2022 - Goal status: DISCONTINUED, pt unable d/t motor deficits   2.  With minimal assistance, pt will use strategies to help during communication breakdowns.  Baseline: new goal 05/16/2022 Goal status: DISCONTINUED, no viable strategies identified as successful  05/16/2022  3.   Pt will respond to general questions using AAC/nonverbal communication effectively with 50% accuracy and mod cues.  Baseline: 25% Goal Status: INITIAL  4.  Pt will use AAC to communicate within group situations with 50% accuracy given Mod to Min cues.   Baseline: 15% Goal Status: INITIAL  5.   Pt will use AAC to communicate in a structured conversation setting with 75% accuracy and min cues.  Baseline: 25% Goal Status: INITIAL     LONG TERM GOALS: Target date: 08/15/2022   03/30/2022 thru 05/16/2022 Pt will utilize strategies to aid in communication breakdowns with Mod I.  Baseline: only using circumlocutions 05/16/2022 Goal status: DISCONTINUED, no viable strategies identified as successful  05/16/2022 2.  To maximize functional communication across all communication contexts using AAC and multimodal means of communication. Baseline: 25% Goal status: INITIAL      ASSESSMENT:  CLINICAL IMPRESSION: Pt continues to be reliant on pictures to help verbal communication. Such as looking at [facility] menu online and picking out the hamburger that he usually orders.  Pt is responding well to use of device. Will request re-certification to continue to train pt and his wife in use of device.   OBJECTIVE IMPAIRMENTS include expressive language and aphasia. These impairments are limiting patient from effectively communicating at home and in community. Factors affecting potential to achieve goals and functional outcome are  time post onset (10 years) and previous ST intervention . Patient will benefit from skilled SLP services to address above impairments and improve overall function.  REHAB POTENTIAL: Fair time post onset (10 years)  PLAN: SLP FREQUENCY: 1-2x/week  SLP DURATION: 8 weeks  PLANNED INTERVENTIONS: Language facilitation, Internal/external aids, SLP instruction and feedback, Compensatory strategies, and Patient/family education   Geri Hepler B. Rutherford Nail, M.S., CCC-SLP,  Mining engineer Certified Brain Injury [place]  [place] Office [phone] Ascom [phone] Fax [phone]

## 2022-06-05 NOTE — Plan of Care (Signed)
Neuro: Resp: CV: GIGU: Skin: Social:   Events: Received patient from Vasc post stent placement, report received from Jan  Problem: Education: Goal: Knowledge of General Education information will improve Description: Including pain rating scale, medication(s)/side effects and non-pharmacologic comfort measures Outcome: Progressing   Problem: Health Behavior/Discharge Planning: Goal: Ability to manage health-related needs will improve Outcome: Progressing   Problem: Clinical Measurements: Goal: Ability to maintain clinical measurements within normal limits will improve Outcome: Progressing Goal: Will remain free from infection Outcome: Progressing Goal: Diagnostic test results will improve Outcome: Progressing Goal: Respiratory complications will improve Outcome: Progressing Goal: Cardiovascular complication will be avoided Outcome: Progressing   Problem: Activity: Goal: Risk for activity intolerance will decrease Outcome: Progressing   Problem: Nutrition: Goal: Adequate nutrition will be maintained Outcome: Progressing   Problem: Coping: Goal: Level of anxiety will decrease Outcome: Progressing   Problem: Elimination: Goal: Will not experience complications related to bowel motility Outcome: Progressing Goal: Will not experience complications related to urinary retention Outcome: Progressing   Problem: Pain Managment: Goal: General experience of comfort will improve Outcome: Progressing   Problem: Safety: Goal: Ability to remain free from injury will improve Outcome: Progressing   Problem: Skin Integrity: Goal: Risk for impaired skin integrity will decrease Outcome: Progressing   Problem: Education: Goal: Ability to describe self-care measures that may prevent or decrease complications (Diabetes Survival Skills Education) will improve Outcome: Progressing Goal: Individualized Educational Video(s) Outcome: Progressing   Problem: Coping: Goal: Ability  to adjust to condition or change in health will improve Outcome: Progressing   Problem: Fluid Volume: Goal: Ability to maintain a balanced intake and output will improve Outcome: Progressing   Problem: Health Behavior/Discharge Planning: Goal: Ability to identify and utilize available resources and services will improve Outcome: Progressing Goal: Ability to manage health-related needs will improve Outcome: Progressing   Problem: Metabolic: Goal: Ability to maintain appropriate glucose levels will improve Outcome: Progressing   Problem: Nutritional: Goal: Maintenance of adequate nutrition will improve Outcome: Progressing Goal: Progress toward achieving an optimal weight will improve Outcome: Progressing   Problem: Skin Integrity: Goal: Risk for impaired skin integrity will decrease Outcome: Progressing   Problem: Tissue Perfusion: Goal: Adequacy of tissue perfusion will improve Outcome: Progressing

## 2022-06-05 NOTE — Interval H&P Note (Signed)
History and Physical Interval Note:  06/05/2022 8:36 AM  Paul Bass  has presented today for surgery, with the diagnosis of LT Carotid Angio poss stent placement   Bil Carotid Stenosis.  The various methods of treatment have been discussed with the patient and family. After consideration of risks, benefits and other options for treatment, the patient has consented to  Procedure(s): CAROTID ANGIOGRAPHY (N/A) as a surgical intervention.  The patient's history has been reviewed, patient examined, no change in status, stable for surgery.  I have reviewed the patient's chart and labs.  Questions were answered to the patient's satisfaction.     Leotis Pain

## 2022-06-05 NOTE — Op Note (Signed)
OPERATIVE NOTE DATE: 06/05/2022  PROCEDURE:  Ultrasound guidance for vascular access right femoral artery  Placement of a 9 mm proximal, 7 mm distal, 4 cm long exact stent with the use of the NAV-6 embolic protection device in the left carotid artery  PRE-OPERATIVE DIAGNOSIS: 1.  High-grade recurrent left carotid artery stenosis. 2.  Previous stroke with worsening neurologic symptoms  POST-OPERATIVE DIAGNOSIS:  Same as above fracture and high-grade stenosis of previously placed left carotid stent  SURGEON: Paul Pain, MD  ASSISTANT(S): None  ANESTHESIA: local/MCS  ESTIMATED BLOOD LOSS: 25 cc  CONTRAST: 85 cc  FLUORO TIME: 8.1 minutes  MODERATE CONSCIOUS SEDATION TIME:  Approximately 42 minutes using 2 mg of Versed and 50 mcg of Fentanyl  FINDING(S): 1.   Fractured left carotid stent with recurrent, greater than 75% left carotid artery stenosis  SPECIMEN(S):   none  INDICATIONS:   Patient is a 76 y.o. male who presents with findings consistent with recurrent in-stent left carotid artery stenosis.  The patient has previous stroke and seems to be having worsening neurologic symptoms and carotid artery stenting was felt to be preferred to endarterectomy for that reason.  Risks and benefits were discussed and informed consent was obtained.   DESCRIPTION: After obtaining full informed written consent, the patient was brought back to the vascular suite and placed supine upon the table.  The patient received IV antibiotics prior to induction. Moderate conscious sedation was administered during a face to face encounter with the patient throughout the procedure with my supervision of the RN administering medicines and monitoring the patients vital signs and mental status throughout from the start of the procedure until the patient was taken to the recovery room.  After obtaining adequate anesthesia, the patient was prepped and draped in the standard fashion.   The right femoral artery  was visualized with ultrasound and found to be widely patent. It was then accessed under direct ultrasound guidance without difficulty with a Seldinger needle. A permanent image was recorded. A J-wire was placed and we then placed a 6 French sheath. The patient was then heparinized and a total of 10,000 units of intravenous heparin were given and an ACT was checked to confirm successful anticoagulation. A pigtail catheter was then placed into the ascending aorta. 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 I then selectively cannulated the left common carotid artery without difficulty with a JB2 catheter but this would not traverse into the common carotid artery to allow treatment.  I then exchanged for a Bentson Haniffee catheter and a Glidewire and advanced into the mid left common carotid artery.  Cervical and cerebral carotid angiography was then performed. There were no obvious intracranial filling defects with intracranial flow being somewhat sluggish. The carotid bifurcation demonstrated a stent fracture in the midportion of the stent with what appeared to be a greater than 75% stenosis.  I then advanced into the internal carotid artery with a Glidewire and the Bentson Haniffee catheter and then exchanged for the Amplatz Super Stiff wire. Over the Amplatz Super Stiff wire, a 6 [place] shuttle sheath was placed into the mid common carotid artery. I then used the NAV-6  Embolic protection device and crossed the lesion and parked this in the distal internal carotid artery at the base of the skull.  Given the stent fracture, realigning this with a new stent was appropriate to treat the stent fracture and stenosis.  I then selected  a 9 mm proximal, 7 mm distal 4 cm long exact stent. This was deployed across the lesion encompassing it in its entirety. A 5 mm x 3 cm length balloon was used to post dilate the stent. Only about a 20% residual stenosis was present after  angioplasty. Completion angiogram showed normal intracranial filling without new defects and somewhat better intracranial flow. At this point I elected to terminate the procedure. The sheath was removed and StarClose closure device was deployed in the right femoral artery with excellent hemostatic result. The patient was taken to the recovery room in stable condition having tolerated the procedure well.  COMPLICATIONS: none  CONDITION: stable  Paul Bass 06/05/2022 9:33 AM   This note was created with Dragon Medical transcription system. Any errors in dictation are purely unintentional.

## 2022-06-06 ENCOUNTER — Encounter: Payer: Self-pay | Admitting: Vascular Surgery

## 2022-06-06 ENCOUNTER — Encounter: Payer: Self-pay | Admitting: Dietician

## 2022-06-06 LAB — CBC
HCT: 38.2 % — ABNORMAL LOW (ref 39.0–52.0)
Hemoglobin: 13.2 g/dL (ref 13.0–17.0)
MCH: 31.7 pg (ref 26.0–34.0)
MCHC: 34.6 g/dL (ref 30.0–36.0)
MCV: 91.6 fL (ref 80.0–100.0)
Platelets: 155 10*3/uL (ref 150–400)
RBC: 4.17 MIL/uL — ABNORMAL LOW (ref 4.22–5.81)
RDW: 12.4 % (ref 11.5–15.5)
WBC: 7.5 10*3/uL (ref 4.0–10.5)
nRBC: 0 % (ref 0.0–0.2)

## 2022-06-06 LAB — BASIC METABOLIC PANEL
Anion gap: 5 (ref 5–15)
BUN: 13 mg/dL (ref 8–23)
CO2: 25 mmol/L (ref 22–32)
Calcium: 8.4 mg/dL — ABNORMAL LOW (ref 8.9–10.3)
Chloride: 111 mmol/L (ref 98–111)
Creatinine, Ser: 0.87 mg/dL (ref 0.61–1.24)
GFR, Estimated: >60 mL/min (ref >60)
Glucose, Bld: 137 mg/dL — ABNORMAL HIGH (ref 70–99)
Potassium: 3.5 mmol/L (ref 3.5–5.1)
Sodium: 141 mmol/L (ref 135–145)

## 2022-06-06 LAB — GLUCOSE, CAPILLARY
Glucose-Capillary: 172 mg/dL — ABNORMAL HIGH (ref 70–99)
Glucose-Capillary: 182 mg/dL — ABNORMAL HIGH (ref 70–99)

## 2022-06-06 LAB — HEMOGLOBIN A1C
Hgb A1c MFr Bld: 6.8 % — ABNORMAL HIGH (ref 4.8–5.6)
Mean Plasma Glucose: 148.46 mg/dL

## 2022-06-06 MED ORDER — ASPIRIN 81 MG PO TBEC: 81.0000 mg | DELAYED_RELEASE_TABLET | Freq: Every day | ORAL | 12 refills | Status: DC

## 2022-06-06 MED ORDER — OXYCODONE-ACETAMINOPHEN 5-325 MG PO TABS: 1.0000 | ORAL_TABLET | Freq: Four times a day (QID) | ORAL | 0 refills | Status: DC | PRN

## 2022-06-06 MED ORDER — FAMOTIDINE 20 MG PO TABS
20.0000 mg | ORAL_TABLET | Freq: Two times a day (BID) | ORAL | Status: DC
  Administered 2022-06-06: 20 mg via ORAL
  Filled 2022-06-06: qty 1

## 2022-06-06 NOTE — Progress Notes (Unsigned)
Nutrition Education Note  RD consulted for nutrition education regarding CHF and DM.  RD provided "Low Sodium Nutrition Therapy" handout from the Academy of Nutrition and Dietetics. Reviewed patient's dietary recall. Provided examples on ways to decrease sodium intake in diet. Discouraged intake of processed foods and use of salt shaker. Encouraged fresh fruits and vegetables as well as whole grain sources of carbohydrates to maximize fiber intake.   RD discussed why it is important for patient to adhere to diet recommendations, and emphasized the role of fluids, foods to avoid, and importance of weighing self daily.   RD also provided "Nutrition with Type II Diabetes" handout from the Academy of Nutrition and Dietetics. Discussed different food groups and their effects on blood sugar, emphasizing carbohydrate-containing foods. Provided list of carbohydrates and recommended serving sizes of common foods.  Discussed importance of controlled and consistent carbohydrate intake throughout the day. Provided examples of ways to balance meals/snacks and encouraged intake of high-fiber, whole grain complex carbohydrates.   Teach back method used.  Expect good compliance.  Koleen Distance MS, RD, LDN Please refer to [facility] for RD and/or RD on-call/weekend/after hours pager

## 2022-06-06 NOTE — Discharge Summary (Signed)
[facility]    Discharge Summary    Patient ID:  CRYSTIAN FRITH MRN: [id number] DOB/AGE: [birth year] 76 y.o.  Admit date: 06/05/2022 Discharge date: 06/06/2022 Date of Surgery: 06/05/2022 Surgeon: Surgeon(s): Lucky Cowboy Erskine Squibb, MD  Admission Diagnosis: Carotid stenosis, symptomatic, with infarction [facility]) [I63.239]  Discharge Diagnoses:  Carotid stenosis, symptomatic, with infarction [facility]) [I63.239]  Secondary Diagnoses: Past Medical History:  Diagnosis Date   Accelerated junctional rhythm    Anemia    Anxiety    a.) Tx'd with BZO PRN   Arthritis    Bilateral carotid artery disease ([name])    BPH (benign prostatic hypertrophy)    CAD S/P percutaneous coronary angioplasty    a.) PCI in 2002 placing a RCA stent (unknown type). b.) LHC 12/07/2015: 5% ISR m-dRCA, 90% dRCA, 25% pRCA, 25% p-mLCx, 100% OM3, 95% oOM2-OM2, 75% m-dLAD; refer to CVTS. c.) 3v CABG 01/03/2016   CHF (congestive heart failure) ([place])    a.) TTE 05/12/2012: EF 50-55%; mild LA enlargement; G1DD. b.)  TTE 11/26/2015: EF 45%; mild BAE; triv PR, mild MR/TR; inferolateral HK; G1DD. c.)  TTE 11/07/2017: EF 35%; RV enlargement; BAE; inferior and inferolateral HK; triv PR; mild MR/TR. d.)  TTE 09/17/2019: EF 35%; mild LVH; triv MR/TR. e.)  TTE 08/11/2021: EF 50%; triv MR/TR; G1DD.   Cognitive deficit following cerebrovascular accident (CVA)    COPD (chronic obstructive pulmonary disease) ([name])    Cortical cataract    Depression    Dyspnea    GERD (gastroesophageal reflux disease)    History of 2019 novel coronavirus disease (COVID-19) 01/19/2021   History of acute inferior wall MI 2002   a.) PCI was performed placing a RCA stent (unknown type)   History of kidney stones    HTN (hypertension)    Hyperlipidemia    Hypothyroidism    Long term current use of antithrombotics/antiplatelets    a.) DAPT therapy (ASA + clopidogrel)   OSA (obstructive sleep apnea)    a.) does not require nocturnal PAP  therapy   PLMD (periodic limb movement disorder)    Postoperative atrial fibrillation ([place]) 01/03/2016   a.) following CABG procedure   Prostatitis    PVD (peripheral vascular disease) ([place])    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 b/) residual RIGHT sided weakness   T2DM (type 2 diabetes mellitus) ([place])    Tobacco abuse     Procedure(s): CAROTID ANGIOGRAPHY  Discharged Condition: good  HPI:  GALILEO COLELLO is a 76 year old male who presented [facility] on 06/05/2022 for Left carotid angioplasty with stent placement. Patient has a prior stent that was fractured creating an 80% stenosis. Post op he is doing well. He denies any headaches or TIA's. He denies any vision changes. He denies any dizziness or vertigo. Denies any difficulties at the puncture site.     Hospital Course:  STEEN BISIG is a 76 y.o. male is S/P Left Carotid Stent Extubated: POD # 0 Physical Exam:  Alert notes x3, no acute distress Face: Symmetrical.  Tongue is midline. Neck: Trachea is midline.  No swelling or bruising. Cardiovascular: Regular rate and rhythm Pulmonary: Clear to auscultation bilaterally Abdomen: Soft, nontender, nondistended Right groin access: Clean dry and intact.  No swelling or drainage noted Left groin access: Clean dry and intact.  No swelling or drainage noted Left lower extremity: Thigh soft.  Calf soft.  Extremities warm distally toes.  Hard to palpate pedal pulses however the foot is warm is his good capillary refill. Right lower extremity: Thigh soft.  Calf soft.  Extremities warm distally toes.  Hard to palpate pedal pulses however the foot is warm is his good capillary refill. Neurological: No deficits noted   Post-op wounds:  clean, dry,  intact or healing well  Pt. Ambulating, voiding and taking PO diet without difficulty. Pt pain controlled with PO pain meds.  Labs:  As below  Complications: none  Consults:    Significant Diagnostic Studies: CBC Lab Results  Component Value Date   WBC 7.5 06/06/2022   HGB 13.2 06/06/2022   HCT 38.2 (L) 06/06/2022   MCV 91.6 06/06/2022   PLT 155 06/06/2022    BMET    Component Value Date/Time   NA 141 06/06/2022 0303   NA 141 05/11/2012 1929   K 3.5 06/06/2022 0303   K 3.9 05/11/2012 1929   CL 111 06/06/2022 0303   CL 107 05/11/2012 1929   CO2 25 06/06/2022 0303   CO2 26 05/11/2012 1929   GLUCOSE 137 (H) 06/06/2022 0303   GLUCOSE 144 (H) 05/11/2012 1929   BUN 13 06/06/2022 0303   BUN 11 05/11/2012 1929   CREATININE 0.87 06/06/2022 0303   CREATININE 0.85 05/11/2012 1929   CALCIUM 8.4 (L) 06/06/2022 0303   CALCIUM 8.5 05/11/2012 1929   GFRNONAA >60 06/06/2022 0303   GFRNONAA >60 05/11/2012 1929   GFRAA >60 07/01/2015 0857   GFRAA >60 05/11/2012 1929   COAG Lab Results  Component Value Date   INR 1.06 07/01/2015   INR 1.03 09/20/2012   INR 0.9 05/11/2012     Disposition:  Discharge to :Home  Allergies as of 06/06/2022   No Known Allergies      Medication List     TAKE these medications    albuterol 108 (90 Base) MCG/ACT inhaler Commonly known as: VENTOLIN HFA SMARTSIG:2 inhalation Via Inhaler Every 4 Hours PRN   aspirin EC 81 MG tablet Take 1 tablet (81 mg total) by mouth daily at 6 (six) AM. Swallow whole. Start taking on: June 07, 2022   atorvastatin 80 MG tablet Commonly known as: LIPITOR Take 80 mg by mouth daily.   buPROPion 150 MG 24 hr tablet Commonly known as: WELLBUTRIN XL Take 150 mg by mouth at bedtime.   cholecalciferol 25 MCG (1000 UNIT) tablet Commonly known as: VITAMIN D3 Take 1,000 Units by mouth every evening.   clonazePAM 0.5 MG tablet Commonly known as: KLONOPIN Take 0.5 mg by mouth 2 (two) times daily.    clopidogrel 75 MG tablet Commonly known as: PLAVIX Take 75 mg by mouth daily.   CoQ10 100 MG Caps Take 100 mg by mouth at bedtime.   fluticasone-salmeterol 500-50 MCG/ACT Aepb Commonly known as: ADVAIR Inhale into the lungs.   glucose blood test strip   Iron 325 (65 Fe) MG Tabs Take 325 mg by mouth daily.   levothyroxine 100 MCG tablet Commonly known as: SYNTHROID Take 100 mcg by mouth daily before breakfast.   metFORMIN 500 MG tablet Commonly known as: GLUCOPHAGE Take 500 mg by mouth 2 (two) times daily.   metoprolol tartrate 25 MG tablet Commonly known as: LOPRESSOR Take 25 mg by mouth 2 (two) times daily.   multivitamin with minerals Tabs  tablet Take 1 tablet by mouth daily.   naproxen sodium 220 MG tablet Commonly known as: ALEVE Take 220-440 mg by mouth 2 (two) times daily as needed (pain.).   oxyCODONE-acetaminophen 5-325 MG tablet Commonly known as: PERCOCET/ROXICET Take 1 tablet by mouth every 6 (six) hours as needed for moderate pain.   pantoprazole 40 MG tablet Commonly known as: PROTONIX Take 1 tablet (40 mg total) by mouth at bedtime.   psyllium 28 % packet Commonly known as: METAMUCIL SMOOTH TEXTURE Take 1 packet by mouth daily.   RA Krill Oil 500 MG Caps Take 500 mg by mouth at bedtime.   tamsulosin 0.4 MG Caps capsule Commonly known as: FLOMAX TAKE 1 CAPSULE BY MOUTH EVERY DAY   venlafaxine XR 150 MG 24 hr capsule Commonly known as: EFFEXOR-XR Take 150 mg by mouth daily.       Verbal and written Discharge instructions given to the patient. Wound care per Discharge AVS  Follow-up Information     Dew, Erskine Squibb, MD Follow up in 3 week(s).   Specialties: Vascular Surgery, Radiology, Interventional Cardiology Why: Follow up JD/APP in 3 weeks with carotid. Contact information: [place] [address] [phone]                 Signed: Kris Hartmann, NP  06/06/2022, 9:31 AM

## 2022-06-06 NOTE — Plan of Care (Signed)
Discharge instructions provided to patient and wife. All questions and concerns addressed.   Problem: Education: Goal: Knowledge of General Education information will improve Description: Including pain rating scale, medication(s)/side effects and non-pharmacologic comfort measures Outcome: Adequate for Discharge   Problem: Health Behavior/Discharge Planning: Goal: Ability to manage health-related needs will improve Outcome: Adequate for Discharge   Problem: Clinical Measurements: Goal: Ability to maintain clinical measurements within normal limits will improve Outcome: Adequate for Discharge Goal: Will remain free from infection Outcome: Adequate for Discharge Goal: Diagnostic test results will improve Outcome: Adequate for Discharge Goal: Respiratory complications will improve Outcome: Adequate for Discharge Goal: Cardiovascular complication will be avoided Outcome: Adequate for Discharge   Problem: Activity: Goal: Risk for activity intolerance will decrease Outcome: Adequate for Discharge   Problem: Nutrition: Goal: Adequate nutrition will be maintained Outcome: Adequate for Discharge   Problem: Coping: Goal: Level of anxiety will decrease Outcome: Adequate for Discharge   Problem: Elimination: Goal: Will not experience complications related to bowel motility Outcome: Adequate for Discharge Goal: Will not experience complications related to urinary retention Outcome: Adequate for Discharge   Problem: Pain Managment: Goal: General experience of comfort will improve Outcome: Adequate for Discharge   Problem: Safety: Goal: Ability to remain free from injury will improve Outcome: Adequate for Discharge   Problem: Skin Integrity: Goal: Risk for impaired skin integrity will decrease Outcome: Adequate for Discharge   Problem: Education: Goal: Ability to describe self-care measures that may prevent or decrease complications (Diabetes Survival Skills Education) will  improve Outcome: Adequate for Discharge Goal: Individualized Educational Video(s) Outcome: Adequate for Discharge   Problem: Coping: Goal: Ability to adjust to condition or change in health will improve Outcome: Adequate for Discharge   Problem: Fluid Volume: Goal: Ability to maintain a balanced intake and output will improve Outcome: Adequate for Discharge   Problem: Health Behavior/Discharge Planning: Goal: Ability to identify and utilize available resources and services will improve Outcome: Adequate for Discharge Goal: Ability to manage health-related needs will improve Outcome: Adequate for Discharge   Problem: Metabolic: Goal: Ability to maintain appropriate glucose levels will improve Outcome: Adequate for Discharge   Problem: Nutritional: Goal: Maintenance of adequate nutrition will improve Outcome: Adequate for Discharge Goal: Progress toward achieving an optimal weight will improve Outcome: Adequate for Discharge   Problem: Skin Integrity: Goal: Risk for impaired skin integrity will decrease Outcome: Adequate for Discharge   Problem: Tissue Perfusion: Goal: Adequacy of tissue perfusion will improve Outcome: Adequate for Discharge

## 2022-06-06 NOTE — TOC Initial Note (Signed)
Transition of Care [facility]) - Initial/Assessment Note    Patient Details  Name: Paul Bass MRN: [id number] Date of Birth: 11-08-1945  Transition of Care [facility]) CM/SW Contact:    Shelbie Hutching, RN Phone Number: 06/06/2022, 11:17 AM  Clinical Narrative:                   Transition of Care [facility]) Screening Note   Patient Details  Name: Paul Bass Date of Birth: [birth year]   Transition of Care [facility]) CM/SW Contact:    Shelbie Hutching, RN Phone Number: 06/06/2022, 11:17 AM    Transition of Care Department [facility]) has reviewed patient and no TOC needs have been identified at this time. We will continue to monitor patient advancement through interdisciplinary progression rounds. If new patient transition needs arise, please place a TOC consult.         Patient Goals and CMS Choice        Expected Discharge Plan and Services           Expected Discharge Date: 06/06/22                                    Prior Living Arrangements/Services                       Activities of Daily Living Home Assistive Devices/Equipment: None ADL Screening (condition at time of admission) Patient's cognitive ability adequate to safely complete daily activities?: Yes Patient able to express need for assistance with ADLs?: Yes Independently performs ADLs?: Yes (appropriate for developmental age) Does the patient have difficulty walking or climbing stairs?: No Weakness of Legs: None Weakness of Arms/Hands: Right  Permission Sought/Granted                  Emotional Assessment              Admission diagnosis:  Carotid stenosis, symptomatic, with infarction [facility]) [I63.239] Patient Active Problem List   Diagnosis Date Noted   Carotid stenosis, symptomatic, with infarction ([place]) 06/05/2022   History of colonic polyps    Anxiety state 11/17/2021   Cognitive deficit, post-stroke 11/17/2021   Ex-smoker 11/17/2021   Bradycardia 02/03/2021   Chronic  systolic CHF (congestive heart failure), NYHA class 2 ([place]) 05/27/2020   Accelerated junctional rhythm 04/21/2019   Encounter for screening colonoscopy    Polyp of colon    Atherosclerotic heart disease of native coronary artery without angina pectoris 01/14/2019   Depression 01/14/2019   GERD (gastroesophageal reflux disease) 01/14/2019   Periodic limb movement disorder (PLMD) 01/14/2019   Sleep apnea 01/14/2019   Type 2 diabetes mellitus without complications ([place]) 75/91/6384   Stricture and stenosis of esophagus    Problems with swallowing and mastication    Food impaction of esophagus    H/O coronary artery bypass surgery 01/17/2016   Cerebrovascular accident (CVA) due to embolism ([place]) 01/03/2016   Hypothyroidism, unspecified 01/03/2016   HLD (hyperlipidemia) 01/03/2016   Stable angina 11/29/2015   Benign essential hypertension 05/27/2015   Bilateral carotid artery stenosis 02/24/2015   Atherosclerotic peripheral vascular disease ([place]) 09/07/2014   Bladder calculus 08/12/2014   Primary osteoarthritis of left knee 03/10/2014   Elevated prostate specific antigen (PSA) 02/05/2013   Snoring 01/22/2013   Sleepiness 01/22/2013   Dysphagia, unspecified(787.20) 01/22/2013   Occlusion and stenosis of carotid artery without mention of cerebral infarction 01/22/2013  Cerebral thrombosis with cerebral infarction ([place]) 01/22/2013   Benign prostatic hyperplasia with incomplete bladder emptying 08/26/2012   Chronic prostatitis 08/26/2012   Family history of malignant neoplasm of prostate 08/26/2012   Incomplete emptying of bladder 08/26/2012   Dysphagia 05/14/2012   Global aphasia 05/14/2012   Stroke, acute, thrombotic ([place]) 05/12/2012   Symptomatic carotid artery stenosis 05/12/2012   HTN (hypertension) 05/12/2012   COPD (chronic obstructive pulmonary disease) ([place]) 05/12/2012   Acute respiratory failure ([place]) 05/12/2012   PCP:  [name], NP Pharmacy:   [id number] [place] [id number]: [phone]: [phone] [id number] [id number]: [phone]: [phone]   Social Determinants of Health (SDOH) Interventions    Readmission Risk Interventions     No data to display

## 2022-06-06 NOTE — Progress Notes (Signed)
PHARMACIST - PHYSICIAN COMMUNICATION  CONCERNING: IV to Oral Route Change Policy  RECOMMENDATION: This patient is receiving famotidine by the intravenous route.  Based on criteria approved by the Pharmacy and Therapeutics Committee, the intravenous medication(s) is/are being converted to the equivalent oral dose form(s).   DESCRIPTION: These criteria include: The patient is eating (either orally or via tube) and/or has been taking other orally administered medications for a least 24 hours The patient has no evidence of active gastrointestinal bleeding or impaired GI absorption (gastrectomy, short bowel, patient on TNA or NPO).  If you have questions about this conversion, please contact the [place] [place] 06/06/2022 7:28 AM

## 2022-06-07 ENCOUNTER — Ambulatory Visit: Payer: Medicare HMO | Admitting: Speech Pathology

## 2022-06-12 ENCOUNTER — Ambulatory Visit: Payer: Medicare HMO | Admitting: Speech Pathology

## 2022-06-12 DIAGNOSIS — I63512 Cerebral infarction due to unspecified occlusion or stenosis of left middle cerebral artery: Secondary | ICD-10-CM | POA: Diagnosis not present

## 2022-06-12 DIAGNOSIS — R482 Apraxia: Secondary | ICD-10-CM | POA: Diagnosis not present

## 2022-06-12 DIAGNOSIS — R4701 Aphasia: Secondary | ICD-10-CM

## 2022-06-12 NOTE — Therapy (Signed)
OUTPATIENT SPEECH LANGUAGE PATHOLOGY TREATMENT  RE-CERTIFICATION  Patient Name: Paul Bass MRN: [id number] DOB:01-Oct-1945, 76 y.o., male Today's Date: 06/12/2022  PCP: [name], NP REFERRING PROVIDER: Jennings Books, MD  END OF SESSION:   End of Session - 06/12/22 0903     Visit Number 14    Number of Visits 59    Date for SLP Re-Evaluation 08/15/22    Authorization Type [employer] H[employer]    Authorization Time Period 03/30/2022 thru 08/21/2022    Authorization - Visit Number 37    Authorization - Number of Visits 43    Progress Note Due on Visit 28    SLP Start Time 0900    SLP Stop Time  1000    SLP Time Calculation (min) 60 min    Activity Tolerance Patient tolerated treatment well             Past Medical History:  Diagnosis Date   Accelerated junctional rhythm    Anemia    Anxiety    a.) Tx'd with BZO PRN   Arthritis    Bilateral carotid artery disease ([name])    BPH (benign prostatic hypertrophy)    CAD S/P percutaneous coronary angioplasty    a.) PCI in 2002 placing a RCA stent (unknown type). b.) LHC 12/07/2015: 5% ISR m-dRCA, 90% dRCA, 25% pRCA, 25% p-mLCx, 100% OM3, 95% oOM2-OM2, 75% m-dLAD; refer to CVTS. c.) 3v CABG 01/03/2016   CHF (congestive heart failure) ([place])    a.) TTE 05/12/2012: EF 50-55%; mild LA enlargement; G1DD. b.)  TTE 11/26/2015: EF 45%; mild BAE; triv PR, mild MR/TR; inferolateral HK; G1DD. c.)  TTE 11/07/2017: EF 35%; RV enlargement; BAE; inferior and inferolateral HK; triv PR; mild MR/TR. d.)  TTE 09/17/2019: EF 35%; mild LVH; triv MR/TR. e.)  TTE 08/11/2021: EF 50%; triv MR/TR; G1DD.   Cognitive deficit following cerebrovascular accident (CVA)    COPD (chronic obstructive pulmonary disease) ([name])    Cortical cataract    Depression    Dyspnea    GERD (gastroesophageal reflux disease)    History of 2019 novel coronavirus disease (COVID-19) 01/19/2021   History of acute inferior wall MI 2002   a.) PCI was performed placing  a RCA stent (unknown type)   History of kidney stones    HTN (hypertension)    Hyperlipidemia    Hypothyroidism    Long term current use of antithrombotics/antiplatelets    a.) DAPT therapy (ASA + clopidogrel)   OSA (obstructive sleep apnea)    a.) does not require nocturnal PAP therapy   PLMD (periodic limb movement disorder)    Postoperative atrial fibrillation ([place]) 01/03/2016   a.) following CABG procedure   Prostatitis    PVD (peripheral vascular disease) ([place])    S/P CABG x 3 01/03/2016   a.) LIMA-LAD, SVG-PDA, SVG-OM2   Stroke ([place]) 04/2012   a.) LEFT M1 occlusion from possible moderate LEFT ICA stenosis; Tx with TPA + mechanical embolectomy with Trevo Provue retrieval device with full recanalization and proximal LEFT ICA rescue stent. b/) residual RIGHT sided weakness   T2DM (type 2 diabetes mellitus) ([place])    Tobacco abuse    Past Surgical History:  Procedure Laterality Date   CARDIAC CATHETERIZATION N/A 12/07/2015   Procedure: Left Heart Cath and Coronary Angiography;  Surgeon: Corey Skains, MD;  Location: [place] [facility];  Service: Cardiovascular;  Laterality: N/A;   CAROTID ANGIOGRAPHY N/A 06/05/2022   Procedure: CAROTID ANGIOGRAPHY;  Surgeon: Algernon Huxley, MD;  Location: [place] [facility];  Service: Cardiovascular;  Laterality: N/A;   CAROTID PTA/STENT INTERVENTION Left    HX: 2 stents   CATARACT EXTRACTION W/PHACO Left 03/24/2021   Procedure: CATARACT EXTRACTION PHACO AND INTRAOCULAR LENS PLACEMENT (IOC) LEFT DIABETIC 6.67 00:42.5;  Surgeon: Birder Robson, MD;  Location: [facility];  Service: Ophthalmology;  Laterality: Left;   COLONOSCOPY WITH PROPOFOL N/A 02/18/2019   Procedure: COLONOSCOPY WITH PROPOFOL;  Surgeon: Lucilla Lame, MD;  Location[facility];  Service: Endoscopy;  Laterality: N/A;   COLONOSCOPY WITH PROPOFOL N/A 12/22/2021   Procedure: COLONOSCOPY WITH PROPOFOL;  Surgeon: Lucilla Lame, MD;  Location: [facility] ENDOSCOPY;  Service: Endoscopy;   Laterality: N/A;   CORONARY ANGIOPLASTY WITH STENT PLACEMENT Left 2002   CORONARY ARTERY BYPASS GRAFT N/A 01/03/2016   Procedure: 3v CORONARY ARTERY BYPASS GRAFT (LIMA-LAD, SVG-PDA, SVG-OM2); Location: [facility]; Surgeon: Dwaine Deter, MD   CYSTOSCOPY W/ URETERAL STENT REMOVAL N/A 10/04/2021   Procedure: CYSTOSCOPY WITH PROSTATE FOREIGN BODY REMOVAL;  Surgeon: Abbie Sons, MD;  Location: [facility];  Service: Urology;  Laterality: N/A;   CYSTOSCOPY WITH INSERTION OF UROLIFT N/A 04/29/2019   Procedure: CYSTOSCOPY WITH INSERTION OF UROLIFT;  Surgeon: Abbie Sons, MD;  Location: [facility];  Service: Urology;  Laterality: N/A;   ESOPHAGOGASTRODUODENOSCOPY (EGD) WITH PROPOFOL N/A 09/11/2016   Procedure: ESOPHAGOGASTRODUODENOSCOPY (EGD) WITH PROPOFOL;  Surgeon: Lucilla Lame, MD;  Location: [facility];  Service: Endoscopy;  Laterality: N/A;   ESOPHAGOGASTRODUODENOSCOPY (EGD) WITH PROPOFOL N/A 10/17/2016   Procedure: ESOPHAGOGASTRODUODENOSCOPY (EGD) WITH PROPOFOL;  Surgeon: Lucilla Lame, MD;  Location: [facility];  Service: Endoscopy;  Laterality: N/A;   ESOPHAGOGASTRODUODENOSCOPY (EGD) WITH PROPOFOL N/A 11/13/2017   Procedure: ESOPHAGOGASTRODUODENOSCOPY (EGD) WITH PROPOFOL;  Surgeon: Lucilla Lame, MD;  Location: [facility];  Service: Endoscopy;  Laterality: N/A;   pins R lower leg Left    rotator cuff replaced Right    SAVORY DILATION N/A 10/17/2016   Procedure: SAVORY DILATION;  Surgeon: Lucilla Lame, MD;  Location: [facility];  Service: Endoscopy;  Laterality: N/A;   TRANSURETHRAL INCISION OF PROSTATE N/A 10/04/2021   Procedure: TRANSURETHRAL INCISION OF THE PROSTATE (TUIP);  Surgeon: Abbie Sons, MD;  Location: [facility];  Service: Urology;  Laterality: N/A;   Patient Active Problem List   Diagnosis Date Noted   Carotid stenosis, symptomatic, with infarction ([place]) 06/05/2022   History of colonic polyps    Anxiety state 11/17/2021   Cognitive deficit, post-stroke 11/17/2021    Ex-smoker 11/17/2021   Bradycardia 62/26/3335   Chronic systolic CHF (congestive heart failure), NYHA class 2 ([place]) 05/27/2020   Accelerated junctional rhythm 04/21/2019   Encounter for screening colonoscopy    Polyp of colon    Atherosclerotic heart disease of native coronary artery without angina pectoris 01/14/2019   Depression 01/14/2019   GERD (gastroesophageal reflux disease) 01/14/2019   Periodic limb movement disorder (PLMD) 01/14/2019   Sleep apnea 01/14/2019   Type 2 diabetes mellitus without complications ([place]) 45/62/5638   Stricture and stenosis of esophagus    Problems with swallowing and mastication    Food impaction of esophagus    H/O coronary artery bypass surgery 01/17/2016   Cerebrovascular accident (CVA) due to embolism ([place]) 01/03/2016   Hypothyroidism, unspecified 01/03/2016   HLD (hyperlipidemia) 01/03/2016   Stable angina 11/29/2015   Benign essential hypertension 05/27/2015   Bilateral carotid artery stenosis 02/24/2015   Atherosclerotic peripheral vascular disease ([place]) 09/07/2014   Bladder calculus 08/12/2014   Primary osteoarthritis of left knee 03/10/2014  Elevated prostate specific antigen (PSA) 02/05/2013   Snoring 01/22/2013   Sleepiness 01/22/2013   Dysphagia, unspecified(787.20) 01/22/2013   Occlusion and stenosis of carotid artery without mention of cerebral infarction 01/22/2013   Cerebral thrombosis with cerebral infarction ([place]) 01/22/2013   Benign prostatic hyperplasia with incomplete bladder emptying 08/26/2012   Chronic prostatitis 08/26/2012   Family history of malignant neoplasm of prostate 08/26/2012   Incomplete emptying of bladder 08/26/2012   Dysphagia 05/14/2012   Global aphasia 05/14/2012   Stroke, acute, thrombotic ([place]) 05/12/2012   Symptomatic carotid artery stenosis 05/12/2012   HTN (hypertension) 05/12/2012   COPD (chronic obstructive pulmonary disease) ([place]) 05/12/2012   Acute respiratory failure ([place]) 05/12/2012     ONSET DATE: 05/13/2012  REFERRING DIAG: V56.433 (ICD-10-CM) - Cognitive deficit, post-stroke   PERTINENT HISTORY: Pt is a 76 year old male with past medical hx of anxiety, COP, depression, diabetes mellitus without complication, HTN, hyperlipidemia.    Of note, pt received Outpatient ST services for a total of 28 sessions ending on 03/24/2020. At the end of that POC pt required moderate assistance.      DIAGNOSTIC FINDINGS:  MRI 10/03/2019  No acute intracranial abnormality. 2. Moderate-sized chronic left MCA infarct. 3. Mild chronic small vessel ischemic disease, mildly progressed from 2014. 4. Progressive cerebral atrophy.  THERAPY DIAG:  Aphasia  Apraxia  Left middle cerebral artery stroke [facility])  Rationale for Evaluation and Treatment Rehabilitation  SUBJECTIVE: "I showed Vaughan Basta all that I could" " I am stiff from laying around all weekend"  Pt accompanied by: self  PAIN:  Are you having pain? No  PATIENT GOALS: he would like to be able to communicate better   OBJECTIVE:   TODAY'S TREATMENT: Skilled treatment session targeted pt's word finding and communication breakdown goals. SLP facilitated session by providing the following interventions:  Pt reports that he already went to ENT - will obtain notes from visit   Using colored navigational guide previously created specific to patient - pt required maximal multimodal assistance to use guide to location information on SGD - specifically, pt required cues to locate information in guide and then select the same icons on SGD   Pt with improved response to navigational guide only with questions - "Where do I find......" With maximal faded to moderate assistance, pt able to locate folder containing requested information with 60%   Increased struggle noted with icons that don't have picture support  - "I was looking for a picture"      PATIENT EDUCATION: Education details: SGD Person educated: Patient and wife  (via text) Education method: Explanation and Handouts Education comprehension: verbalized understanding  GOALS: Goals reviewed with patient? Yes   SHORT TERM GOALS: Target date: 10 sessions  03/30/2022 - 05/16/2022 Pt will learn strategies (such as drawing, gestures) to aid during communication breakdowns.  Baseline: only using circumlocutions 05/16/2022 - Goal status: DISCONTINUED, pt unable d/t motor deficits   2.  With minimal assistance, pt will use strategies to help during communication breakdowns.  Baseline: new goal 05/16/2022 Goal status: DISCONTINUED, no viable strategies identified as successful  05/16/2022  3.  Pt will respond to general questions using AAC/nonverbal communication effectively with 50% accuracy and mod cues.  Baseline: 25% Goal Status: INITIAL  4.  Pt will use AAC to communicate within group situations with 50% accuracy given Mod to Min cues.   Baseline: 15% Goal Status: INITIAL  5.   Pt will use AAC to communicate in a structured conversation setting with  75% accuracy and min cues.  Baseline: 25% Goal Status: INITIAL     LONG TERM GOALS: Target date: 08/15/2022   03/30/2022 thru 05/16/2022 Pt will utilize strategies to aid in communication breakdowns with Mod I.  Baseline: only using circumlocutions 05/16/2022 Goal status: DISCONTINUED, no viable strategies identified as successful  05/16/2022 2.  To maximize functional communication across all communication contexts using AAC and multimodal means of communication. Baseline: 25% Goal status: INITIAL      ASSESSMENT:  CLINICAL IMPRESSION: Pt had left carotid angioplasty with stent placement on 06/05/2022 and missed 1 week of skilled ST intervention. Pt noted with difficulty finding pictures and written words in field of 8-10 on his SGD ("show me [employer])." Pt able to read the words in isolation but was not able to perform task.  Pt doesn't report any other focal difficulties but was  surprised by struggle in today's session. In addition, pt left his jacket on back of chair which is the first time that he has ever done that. Will continue to monitor closely.   OBJECTIVE IMPAIRMENTS include expressive language and aphasia. These impairments are limiting patient from effectively communicating at home and in community. Factors affecting potential to achieve goals and functional outcome are  time post onset (10 years) and previous ST intervention . Patient will benefit from skilled SLP services to address above impairments and improve overall function.  REHAB POTENTIAL: Fair time post onset (10 years)  PLAN: SLP FREQUENCY: 1-2x/week  SLP DURATION: 8 weeks  PLANNED INTERVENTIONS: Language facilitation, Internal/external aids, SLP instruction and feedback, Compensatory strategies, and Patient/family education   Lealand Elting B. Rutherford Nail, M.S., CCC-SLP, Mining engineer Certified Brain Injury [place]  [place] Office [phone] Ascom [phone] Fax [phone]

## 2022-06-14 ENCOUNTER — Ambulatory Visit: Payer: Medicare HMO | Admitting: Speech Pathology

## 2022-06-14 DIAGNOSIS — I2510 Atherosclerotic heart disease of native coronary artery without angina pectoris: Secondary | ICD-10-CM | POA: Diagnosis not present

## 2022-06-14 DIAGNOSIS — R4701 Aphasia: Secondary | ICD-10-CM

## 2022-06-14 DIAGNOSIS — F418 Other specified anxiety disorders: Secondary | ICD-10-CM | POA: Diagnosis not present

## 2022-06-14 DIAGNOSIS — I69319 Unspecified symptoms and signs involving cognitive functions following cerebral infarction: Secondary | ICD-10-CM | POA: Diagnosis not present

## 2022-06-14 DIAGNOSIS — Z87440 Personal history of urinary (tract) infections: Secondary | ICD-10-CM | POA: Diagnosis not present

## 2022-06-14 DIAGNOSIS — I110 Hypertensive heart disease with heart failure: Secondary | ICD-10-CM | POA: Diagnosis not present

## 2022-06-14 DIAGNOSIS — Z79899 Other long term (current) drug therapy: Secondary | ICD-10-CM | POA: Diagnosis not present

## 2022-06-14 DIAGNOSIS — R482 Apraxia: Secondary | ICD-10-CM

## 2022-06-14 DIAGNOSIS — E782 Mixed hyperlipidemia: Secondary | ICD-10-CM | POA: Diagnosis not present

## 2022-06-14 DIAGNOSIS — E1159 Type 2 diabetes mellitus with other circulatory complications: Secondary | ICD-10-CM | POA: Diagnosis not present

## 2022-06-14 DIAGNOSIS — E039 Hypothyroidism, unspecified: Secondary | ICD-10-CM | POA: Diagnosis not present

## 2022-06-14 DIAGNOSIS — I63512 Cerebral infarction due to unspecified occlusion or stenosis of left middle cerebral artery: Secondary | ICD-10-CM | POA: Diagnosis not present

## 2022-06-14 DIAGNOSIS — J449 Chronic obstructive pulmonary disease, unspecified: Secondary | ICD-10-CM | POA: Diagnosis not present

## 2022-06-14 DIAGNOSIS — N390 Urinary tract infection, site not specified: Secondary | ICD-10-CM | POA: Diagnosis not present

## 2022-06-14 NOTE — Therapy (Signed)
OUTPATIENT SPEECH LANGUAGE PATHOLOGY TREATMENT    Patient Name: CLYDE ZARRELLA MRN: [id number] DOB:02/21/1946, 76 y.o., male Today's Date: 06/14/2022  PCP: [name], NP REFERRING PROVIDER: Jennings Books, MD  END OF SESSION:   End of Session - 06/14/22 0917     Number of Visits 86    Date for SLP Re-Evaluation 08/15/22    Authorization Type [employer] H[employer]    Authorization Time Period 03/30/2022 thru 08/21/2022    Authorization - Visit Number 37    Authorization - Number of Visits 43    Progress Note Due on Visit 40    SLP Start Time 0900    SLP Stop Time  1000    SLP Time Calculation (min) 60 min    Activity Tolerance Patient tolerated treatment well             Past Medical History:  Diagnosis Date   Accelerated junctional rhythm    Anemia    Anxiety    a.) Tx'd with BZO PRN   Arthritis    Bilateral carotid artery disease ([name])    BPH (benign prostatic hypertrophy)    CAD S/P percutaneous coronary angioplasty    a.) PCI in 2002 placing a RCA stent (unknown type). b.) LHC 12/07/2015: 5% ISR m-dRCA, 90% dRCA, 25% pRCA, 25% p-mLCx, 100% OM3, 95% oOM2-OM2, 75% m-dLAD; refer to CVTS. c.) 3v CABG 01/03/2016   CHF (congestive heart failure) ([place])    a.) TTE 05/12/2012: EF 50-55%; mild LA enlargement; G1DD. b.)  TTE 11/26/2015: EF 45%; mild BAE; triv PR, mild MR/TR; inferolateral HK; G1DD. c.)  TTE 11/07/2017: EF 35%; RV enlargement; BAE; inferior and inferolateral HK; triv PR; mild MR/TR. d.)  TTE 09/17/2019: EF 35%; mild LVH; triv MR/TR. e.)  TTE 08/11/2021: EF 50%; triv MR/TR; G1DD.   Cognitive deficit following cerebrovascular accident (CVA)    COPD (chronic obstructive pulmonary disease) ([name])    Cortical cataract    Depression    Dyspnea    GERD (gastroesophageal reflux disease)    History of 2019 novel coronavirus disease (COVID-19) 01/19/2021   History of acute inferior wall MI 2002   a.) PCI was performed placing a RCA stent (unknown type)    History of kidney stones    HTN (hypertension)    Hyperlipidemia    Hypothyroidism    Long term current use of antithrombotics/antiplatelets    a.) DAPT therapy (ASA + clopidogrel)   OSA (obstructive sleep apnea)    a.) does not require nocturnal PAP therapy   PLMD (periodic limb movement disorder)    Postoperative atrial fibrillation ([place]) 01/03/2016   a.) following CABG procedure   Prostatitis    PVD (peripheral vascular disease) ([place])    S/P CABG x 3 01/03/2016   a.) LIMA-LAD, SVG-PDA, SVG-OM2   Stroke ([place]) 04/2012   a.) LEFT M1 occlusion from possible moderate LEFT ICA stenosis; Tx with TPA + mechanical embolectomy with Trevo Provue retrieval device with full recanalization and proximal LEFT ICA rescue stent. b/) residual RIGHT sided weakness   T2DM (type 2 diabetes mellitus) ([place])    Tobacco abuse    Past Surgical History:  Procedure Laterality Date   CARDIAC CATHETERIZATION N/A 12/07/2015   Procedure: Left Heart Cath and Coronary Angiography;  Surgeon: Corey Skains, MD;  Location: [facility];  Service: Cardiovascular;  Laterality: N/A;   CAROTID ANGIOGRAPHY N/A 06/05/2022   Procedure: CAROTID ANGIOGRAPHY;  Surgeon: Algernon Huxley, MD;  Location: [facility];  Service: Cardiovascular;  Laterality: N/A;   CAROTID PTA/STENT INTERVENTION Left    HX: 2 stents   CATARACT EXTRACTION W/PHACO Left 03/24/2021   Procedure: CATARACT EXTRACTION PHACO AND INTRAOCULAR LENS PLACEMENT (IOC) LEFT DIABETIC 6.67 00:42.5;  Surgeon: Birder Robson, MD;  Location: [facility];  Service: Ophthalmology;  Laterality: Left;   COLONOSCOPY WITH PROPOFOL N/A 02/18/2019   Procedure: COLONOSCOPY WITH PROPOFOL;  Surgeon: Lucilla Lame, MD;  Location[facility];  Service: Endoscopy;  Laterality: N/A;   COLONOSCOPY WITH PROPOFOL N/A 12/22/2021   Procedure: COLONOSCOPY WITH PROPOFOL;  Surgeon: Lucilla Lame, MD;  Location: [facility] ENDOSCOPY;  Service: Endoscopy;  Laterality: N/A;   CORONARY  ANGIOPLASTY WITH STENT PLACEMENT Left 2002   CORONARY ARTERY BYPASS GRAFT N/A 01/03/2016   Procedure: 3v CORONARY ARTERY BYPASS GRAFT (LIMA-LAD, SVG-PDA, SVG-OM2); Location: [facility]; Surgeon: Dwaine Deter, MD   CYSTOSCOPY W/ URETERAL STENT REMOVAL N/A 10/04/2021   Procedure: CYSTOSCOPY WITH PROSTATE FOREIGN BODY REMOVAL;  Surgeon: Abbie Sons, MD;  Location: [facility];  Service: Urology;  Laterality: N/A;   CYSTOSCOPY WITH INSERTION OF UROLIFT N/A 04/29/2019   Procedure: CYSTOSCOPY WITH INSERTION OF UROLIFT;  Surgeon: Abbie Sons, MD;  Location: [facility];  Service: Urology;  Laterality: N/A;   ESOPHAGOGASTRODUODENOSCOPY (EGD) WITH PROPOFOL N/A 09/11/2016   Procedure: ESOPHAGOGASTRODUODENOSCOPY (EGD) WITH PROPOFOL;  Surgeon: Lucilla Lame, MD;  Location: [facility];  Service: Endoscopy;  Laterality: N/A;   ESOPHAGOGASTRODUODENOSCOPY (EGD) WITH PROPOFOL N/A 10/17/2016   Procedure: ESOPHAGOGASTRODUODENOSCOPY (EGD) WITH PROPOFOL;  Surgeon: Lucilla Lame, MD;  Location: [facility];  Service: Endoscopy;  Laterality: N/A;   ESOPHAGOGASTRODUODENOSCOPY (EGD) WITH PROPOFOL N/A 11/13/2017   Procedure: ESOPHAGOGASTRODUODENOSCOPY (EGD) WITH PROPOFOL;  Surgeon: Lucilla Lame, MD;  Location: [facility];  Service: Endoscopy;  Laterality: N/A;   pins R lower leg Left    rotator cuff replaced Right    SAVORY DILATION N/A 10/17/2016   Procedure: SAVORY DILATION;  Surgeon: Lucilla Lame, MD;  Location: [facility];  Service: Endoscopy;  Laterality: N/A;   TRANSURETHRAL INCISION OF PROSTATE N/A 10/04/2021   Procedure: TRANSURETHRAL INCISION OF THE PROSTATE (TUIP);  Surgeon: Abbie Sons, MD;  Location: [facility];  Service: Urology;  Laterality: N/A;   Patient Active Problem List   Diagnosis Date Noted   Carotid stenosis, symptomatic, with infarction ([place]) 06/05/2022   History of colonic polyps    Anxiety state 11/17/2021   Cognitive deficit, post-stroke 11/17/2021   Ex-smoker 11/17/2021    Bradycardia 88/41/6606   Chronic systolic CHF (congestive heart failure), NYHA class 2 ([place]) 05/27/2020   Accelerated junctional rhythm 04/21/2019   Encounter for screening colonoscopy    Polyp of colon    Atherosclerotic heart disease of native coronary artery without angina pectoris 01/14/2019   Depression 01/14/2019   GERD (gastroesophageal reflux disease) 01/14/2019   Periodic limb movement disorder (PLMD) 01/14/2019   Sleep apnea 01/14/2019   Type 2 diabetes mellitus without complications ([place]) 30/16/0109   Stricture and stenosis of esophagus    Problems with swallowing and mastication    Food impaction of esophagus    H/O coronary artery bypass surgery 01/17/2016   Cerebrovascular accident (CVA) due to embolism ([place]) 01/03/2016   Hypothyroidism, unspecified 01/03/2016   HLD (hyperlipidemia) 01/03/2016   Stable angina 11/29/2015   Benign essential hypertension 05/27/2015   Bilateral carotid artery stenosis 02/24/2015   Atherosclerotic peripheral vascular disease ([place]) 09/07/2014   Bladder calculus 08/12/2014   Primary osteoarthritis of left knee 03/10/2014   Elevated prostate specific antigen (PSA) 02/05/2013  Snoring 01/22/2013   Sleepiness 01/22/2013   Dysphagia, unspecified(787.20) 01/22/2013   Occlusion and stenosis of carotid artery without mention of cerebral infarction 01/22/2013   Cerebral thrombosis with cerebral infarction ([place]) 01/22/2013   Benign prostatic hyperplasia with incomplete bladder emptying 08/26/2012   Chronic prostatitis 08/26/2012   Family history of malignant neoplasm of prostate 08/26/2012   Incomplete emptying of bladder 08/26/2012   Dysphagia 05/14/2012   Global aphasia 05/14/2012   Stroke, acute, thrombotic ([place]) 05/12/2012   Symptomatic carotid artery stenosis 05/12/2012   HTN (hypertension) 05/12/2012   COPD (chronic obstructive pulmonary disease) ([place]) 05/12/2012   Acute respiratory failure ([place]) 05/12/2012    ONSET DATE:  05/13/2012  REFERRING DIAG: V40.981 (ICD-10-CM) - Cognitive deficit, post-stroke   PERTINENT HISTORY: Pt is a 76 year old male with past medical hx of anxiety, COP, depression, diabetes mellitus without complication, HTN, hyperlipidemia.    Of note, pt received Outpatient ST services for a total of 28 sessions ending on 03/24/2020. At the end of that POC pt required moderate assistance.      DIAGNOSTIC FINDINGS:  MRI 10/03/2019  No acute intracranial abnormality. 2. Moderate-sized chronic left MCA infarct. 3. Mild chronic small vessel ischemic disease, mildly progressed from 2014. 4. Progressive cerebral atrophy.  THERAPY DIAG:  Aphasia  Apraxia  Left middle cerebral artery stroke [facility])  Rationale for Evaluation and Treatment Rehabilitation  SUBJECTIVE: "another one died, my wife's" attempting to communication that his wife's 1st husband passed away this week  Pt accompanied by: self  PAIN:  Are you having pain? No  PATIENT GOALS: he would like to be able to communicate better   OBJECTIVE:   TODAY'S TREATMENT: Skilled treatment session targeted pt's word finding and communication breakdown goals. SLP facilitated session by providing the following interventions:   Changed conversaiton with Vaughan Basta to [employer] and Vaughan Basta- work qustions [place] Veteran - picture icon in [employer] changed to actual patient selected picture from [place] - changed picture from icon of [place] flag to actual picture of [employer] in [place]        PATIENT EDUCATION: Education details: SGD Person educated: Patient and wife (via text) Education method: Explanation and Handouts Education comprehension: verbalized understanding  GOALS: Goals reviewed with patient? Yes   SHORT TERM GOALS: Target date: 10 sessions  03/30/2022 - 05/16/2022 Pt will learn strategies (such as drawing, gestures) to aid during communication breakdowns.  Baseline:  only using circumlocutions 05/16/2022 - Goal status: DISCONTINUED, pt unable d/t motor deficits   2.  With minimal assistance, pt will use strategies to help during communication breakdowns.  Baseline: new goal 05/16/2022 Goal status: DISCONTINUED, no viable strategies identified as successful  05/16/2022  3.  Pt will respond to general questions using AAC/nonverbal communication effectively with 50% accuracy and mod cues.  Baseline: 25% Goal Status: INITIAL  4.  Pt will use AAC to communicate within group situations with 50% accuracy given Mod to Min cues.   Baseline: 15% Goal Status: INITIAL  5.   Pt will use AAC to communicate in a structured conversation setting with 75% accuracy and min cues.  Baseline: 25% Goal Status: INITIAL     LONG TERM GOALS: Target date: 08/15/2022   03/30/2022 thru 05/16/2022 Pt will utilize strategies to aid in communication breakdowns with Mod I.  Baseline: only using circumlocutions 05/16/2022 Goal status: DISCONTINUED, no viable strategies identified as successful  05/16/2022 2.  To maximize functional communication across all communication contexts  using AAC and multimodal means of communication. Baseline: 25% Goal status: INITIAL      ASSESSMENT:  CLINICAL IMPRESSION: Pt had left carotid angioplasty with stent placement on 06/05/2022 and missed 1 week of skilled ST intervention. Pt noted with difficulty finding pictures and written words in field of 8-10 on his SGD ("show me [employer])." Pt able to read the words in isolation but was not able to perform task.  Pt doesn't report any other focal difficulties but was surprised by struggle in today's session. In addition, pt left his jacket on back of chair which is the first time that he has ever done that. Will continue to monitor closely.   OBJECTIVE IMPAIRMENTS include expressive language and aphasia. These impairments are limiting patient from effectively communicating at home and in  community. Factors affecting potential to achieve goals and functional outcome are  time post onset (10 years) and previous ST intervention . Patient will benefit from skilled SLP services to address above impairments and improve overall function.  REHAB POTENTIAL: Fair time post onset (10 years)  PLAN: SLP FREQUENCY: 1-2x/week  SLP DURATION: 8 weeks  PLANNED INTERVENTIONS: Language facilitation, Internal/external aids, SLP instruction and feedback, Compensatory strategies, and Patient/family education   Jeremiah Tarpley B. Rutherford Nail, M.S., CCC-SLP, Mining engineer Certified Brain Injury [place]  [place] Office [phone] Ascom [phone] Fax [phone]

## 2022-06-16 DIAGNOSIS — G4733 Obstructive sleep apnea (adult) (pediatric): Secondary | ICD-10-CM | POA: Diagnosis not present

## 2022-06-16 DIAGNOSIS — I69319 Unspecified symptoms and signs involving cognitive functions following cerebral infarction: Secondary | ICD-10-CM | POA: Diagnosis not present

## 2022-06-16 DIAGNOSIS — R410 Disorientation, unspecified: Secondary | ICD-10-CM | POA: Diagnosis not present

## 2022-06-16 DIAGNOSIS — I2510 Atherosclerotic heart disease of native coronary artery without angina pectoris: Secondary | ICD-10-CM | POA: Diagnosis not present

## 2022-06-16 DIAGNOSIS — Z8673 Personal history of transient ischemic attack (TIA), and cerebral infarction without residual deficits: Secondary | ICD-10-CM | POA: Diagnosis not present

## 2022-06-19 ENCOUNTER — Ambulatory Visit: Payer: Medicare HMO | Admitting: Speech Pathology

## 2022-06-19 DIAGNOSIS — I63512 Cerebral infarction due to unspecified occlusion or stenosis of left middle cerebral artery: Secondary | ICD-10-CM | POA: Diagnosis not present

## 2022-06-19 DIAGNOSIS — R482 Apraxia: Secondary | ICD-10-CM

## 2022-06-19 DIAGNOSIS — R4701 Aphasia: Secondary | ICD-10-CM

## 2022-06-19 NOTE — Therapy (Signed)
OUTPATIENT SPEECH LANGUAGE PATHOLOGY TREATMENT    Patient Name: Paul Bass MRN: [id number] DOB:[birth year], 76 y.o., male Today's Date: 06/19/2022  PCP: [name], NP REFERRING PROVIDER: Jennings Books, MD  END OF SESSION:   End of Session - 06/19/22 1244     Visit Number 16    Number of Visits 63    Date for SLP Re-Evaluation 08/15/22    Authorization Type [employer] HMO    Authorization Time Period 03/30/2022 thru 08/21/2022    Authorization - Visit Number 78    Authorization - Number of Visits 43    Progress Note Due on Visit 80    SLP Start Time 1000    SLP Stop Time  1100    SLP Time Calculation (min) 60 min    Activity Tolerance Patient tolerated treatment well                  Past Medical History:  Diagnosis Date   Accelerated junctional rhythm    Anemia    Anxiety    a.) Tx'd with BZO PRN   Arthritis    Bilateral carotid artery disease ([name])    BPH (benign prostatic hypertrophy)    CAD S/P percutaneous coronary angioplasty    a.) PCI in 2002 placing a RCA stent (unknown type). b.) LHC 12/07/2015: 5% ISR m-dRCA, 90% dRCA, 25% pRCA, 25% p-mLCx, 100% OM3, 95% oOM2-OM2, 75% m-dLAD; refer to CVTS. c.) 3v CABG 01/03/2016   CHF (congestive heart failure) ([place])    a.) TTE 05/12/2012: EF 50-55%; mild LA enlargement; G1DD. b.)  TTE 11/26/2015: EF 45%; mild BAE; triv PR, mild MR/TR; inferolateral HK; G1DD. c.)  TTE 11/07/2017: EF 35%; RV enlargement; BAE; inferior and inferolateral HK; triv PR; mild MR/TR. d.)  TTE 09/17/2019: EF 35%; mild LVH; triv MR/TR. e.)  TTE 08/11/2021: EF 50%; triv MR/TR; G1DD.   Cognitive deficit following cerebrovascular accident (CVA)    COPD (chronic obstructive pulmonary disease) ([name])    Cortical cataract    Depression    Dyspnea    GERD (gastroesophageal reflux disease)    History of 2019 novel coronavirus disease (COVID-19) 01/19/2021   History of acute inferior wall MI 2002   a.) PCI was performed placing a RCA  stent (unknown type)   History of kidney stones    HTN (hypertension)    Hyperlipidemia    Hypothyroidism    Long term current use of antithrombotics/antiplatelets    a.) DAPT therapy (ASA + clopidogrel)   OSA (obstructive sleep apnea)    a.) does not require nocturnal PAP therapy   PLMD (periodic limb movement disorder)    Postoperative atrial fibrillation ([place]) 01/03/2016   a.) following CABG procedure   Prostatitis    PVD (peripheral vascular disease) ([place])    S/P CABG x 3 01/03/2016   a.) LIMA-LAD, SVG-PDA, SVG-OM2   Stroke ([place]) 04/2012   a.) LEFT M1 occlusion from possible moderate LEFT ICA stenosis; Tx with TPA + mechanical embolectomy with Trevo Provue retrieval device with full recanalization and proximal LEFT ICA rescue stent. b/) residual RIGHT sided weakness   T2DM (type 2 diabetes mellitus) ([place])    Tobacco abuse    Past Surgical History:  Procedure Laterality Date   CARDIAC CATHETERIZATION N/A 12/07/2015   Procedure: Left Heart Cath and Coronary Angiography;  Surgeon: Corey Skains, MD;  Location: [facility];  Service: Cardiovascular;  Laterality: N/A;   CAROTID ANGIOGRAPHY N/A 06/05/2022   Procedure: CAROTID ANGIOGRAPHY;  Surgeon: Algernon Huxley, MD;  Location: [facility];  Service: Cardiovascular;  Laterality: N/A;   CAROTID PTA/STENT INTERVENTION Left    HX: 2 stents   CATARACT EXTRACTION W/PHACO Left 03/24/2021   Procedure: CATARACT EXTRACTION PHACO AND INTRAOCULAR LENS PLACEMENT (IOC) LEFT DIABETIC 6.67 00:42.5;  Surgeon: Birder Robson, MD;  Location: [facility];  Service: Ophthalmology;  Laterality: Left;   COLONOSCOPY WITH PROPOFOL N/A 02/18/2019   Procedure: COLONOSCOPY WITH PROPOFOL;  Surgeon: Lucilla Lame, MD;  Location: [facility];  Service: Endoscopy;  Laterality: N/A;   COLONOSCOPY WITH PROPOFOL N/A 12/22/2021   Procedure: COLONOSCOPY WITH PROPOFOL;  Surgeon: Lucilla Lame, MD;  Location: [facility];  Service: Endoscopy;   Laterality: N/A;   CORONARY ANGIOPLASTY WITH STENT PLACEMENT Left 2002   CORONARY ARTERY BYPASS GRAFT N/A 01/03/2016   Procedure: 3v CORONARY ARTERY BYPASS GRAFT (LIMA-LAD, SVG-PDA, SVG-OM2); Location: [facility]; Surgeon: Dwaine Deter, MD   CYSTOSCOPY W/ URETERAL STENT REMOVAL N/A 10/04/2021   Procedure: CYSTOSCOPY WITH PROSTATE FOREIGN BODY REMOVAL;  Surgeon: Abbie Sons, MD;  Location: [facility];  Service: Urology;  Laterality: N/A;   CYSTOSCOPY WITH INSERTION OF UROLIFT N/A 04/29/2019   Procedure: CYSTOSCOPY WITH INSERTION OF UROLIFT;  Surgeon: Abbie Sons, MD;  Location: [facility];  Service: Urology;  Laterality: N/A;   ESOPHAGOGASTRODUODENOSCOPY (EGD) WITH PROPOFOL N/A 09/11/2016   Procedure: ESOPHAGOGASTRODUODENOSCOPY (EGD) WITH PROPOFOL;  Surgeon: Lucilla Lame, MD;  Location: [facility];  Service: Endoscopy;  Laterality: N/A;   ESOPHAGOGASTRODUODENOSCOPY (EGD) WITH PROPOFOL N/A 10/17/2016   Procedure: ESOPHAGOGASTRODUODENOSCOPY (EGD) WITH PROPOFOL;  Surgeon: Lucilla Lame, MD;  Location: [facility];  Service: Endoscopy;  Laterality: N/A;   ESOPHAGOGASTRODUODENOSCOPY (EGD) WITH PROPOFOL N/A 11/13/2017   Procedure: ESOPHAGOGASTRODUODENOSCOPY (EGD) WITH PROPOFOL;  Surgeon: Lucilla Lame, MD;  Location: [facility];  Service: Endoscopy;  Laterality: N/A;   pins R lower leg Left    rotator cuff replaced Right    SAVORY DILATION N/A 10/17/2016   Procedure: SAVORY DILATION;  Surgeon: Lucilla Lame, MD;  Location: [facility];  Service: Endoscopy;  Laterality: N/A;   TRANSURETHRAL INCISION OF PROSTATE N/A 10/04/2021   Procedure: TRANSURETHRAL INCISION OF THE PROSTATE (TUIP);  Surgeon: Abbie Sons, MD;  Location: [facility];  Service: Urology;  Laterality: N/A;   Patient Active Problem List   Diagnosis Date Noted   Carotid stenosis, symptomatic, with infarction ([place]) 06/05/2022   History of colonic polyps    Anxiety state 11/17/2021   Cognitive deficit, post-stroke 11/17/2021    Ex-smoker 11/17/2021   Bradycardia 29/52/8413   Chronic systolic CHF (congestive heart failure), NYHA class 2 ([place]) 05/27/2020   Accelerated junctional rhythm 04/21/2019   Encounter for screening colonoscopy    Polyp of colon    Atherosclerotic heart disease of native coronary artery without angina pectoris 01/14/2019   Depression 01/14/2019   GERD (gastroesophageal reflux disease) 01/14/2019   Periodic limb movement disorder (PLMD) 01/14/2019   Sleep apnea 01/14/2019   Type 2 diabetes mellitus without complications ([place]) 24/40/1027   Stricture and stenosis of esophagus    Problems with swallowing and mastication    Food impaction of esophagus    H/O coronary artery bypass surgery 01/17/2016   Cerebrovascular accident (CVA) due to embolism ([place]) 01/03/2016   Hypothyroidism, unspecified 01/03/2016   HLD (hyperlipidemia) 01/03/2016   Stable angina 11/29/2015   Benign essential hypertension 05/27/2015   Bilateral carotid artery stenosis 02/24/2015   Atherosclerotic peripheral vascular disease ([place]) 09/07/2014   Bladder calculus 08/12/2014   Primary osteoarthritis  of left knee 03/10/2014   Elevated prostate specific antigen (PSA) 02/05/2013   Snoring 01/22/2013   Sleepiness 01/22/2013   Dysphagia, unspecified(787.20) 01/22/2013   Occlusion and stenosis of carotid artery without mention of cerebral infarction 01/22/2013   Cerebral thrombosis with cerebral infarction ([place]) 01/22/2013   Benign prostatic hyperplasia with incomplete bladder emptying 08/26/2012   Chronic prostatitis 08/26/2012   Family history of malignant neoplasm of prostate 08/26/2012   Incomplete emptying of bladder 08/26/2012   Dysphagia 05/14/2012   Global aphasia 05/14/2012   Stroke, acute, thrombotic ([place]) 05/12/2012   Symptomatic carotid artery stenosis 05/12/2012   HTN (hypertension) 05/12/2012   COPD (chronic obstructive pulmonary disease) ([place]) 05/12/2012   Acute respiratory failure ([place]) 05/12/2012     ONSET DATE: 05/13/2012  REFERRING DIAG: Y09.983 (ICD-10-CM) - Cognitive deficit, post-stroke   PERTINENT HISTORY: Pt is a 77 year old male with past medical hx of anxiety, COP, depression, diabetes mellitus without complication, HTN, hyperlipidemia.    Of note, pt received Outpatient ST services for a total of 28 sessions ending on 03/24/2020. At the end of that POC pt required moderate assistance.      DIAGNOSTIC FINDINGS:  MRI 10/03/2019  No acute intracranial abnormality. 2. Moderate-sized chronic left MCA infarct. 3. Mild chronic small vessel ischemic disease, mildly progressed from 2014. 4. Progressive cerebral atrophy.  THERAPY DIAG:  Aphasia  Apraxia  Left middle cerebral artery stroke [facility])  Rationale for Evaluation and Treatment Rehabilitation  SUBJECTIVE: pt reports receiving paperwork from [place] that his SGD will be on its way   Pt accompanied by: self  PAIN:  Are you having pain? No  PATIENT GOALS: he would like to be able to communicate better   OBJECTIVE:   TODAY'S TREATMENT: Skilled treatment session targeted pt's word finding and communication breakdown goals. SLP facilitated session by providing the following interventions:  Pt with improved ability to locate information on SGD. When presented with prompt, "you need to have your medication refilled...Marland KitchenMarland Kitchen" where would you find that information, pt able to locate such information in 9 out of 10 opportunities with rare Min A.      PATIENT EDUCATION: Education details: SGD Person educated: Patient and wife (via text) Education method: Explanation and Handouts Education comprehension: verbalized understanding  GOALS: Goals reviewed with patient? Yes   SHORT TERM GOALS: Target date: 10 sessions  03/30/2022 - 05/16/2022 Pt will learn strategies (such as drawing, gestures) to aid during communication breakdowns.  Baseline: only using circumlocutions 05/16/2022 - Goal status: DISCONTINUED,  pt unable d/t motor deficits   2.  With minimal assistance, pt will use strategies to help during communication breakdowns.  Baseline: new goal 05/16/2022 Goal status: DISCONTINUED, no viable strategies identified as successful  05/16/2022  3.  Pt will respond to general questions using AAC/nonverbal communication effectively with 50% accuracy and mod cues.  Baseline: 25% Goal Status: INITIAL  4.  Pt will use AAC to communicate within group situations with 50% accuracy given Mod to Min cues.   Baseline: 15% Goal Status: INITIAL  5.   Pt will use AAC to communicate in a structured conversation setting with 75% accuracy and min cues.  Baseline: 25% Goal Status: INITIAL     LONG TERM GOALS: Target date: 08/15/2022   03/30/2022 thru 05/16/2022 Pt will utilize strategies to aid in communication breakdowns with Mod I.  Baseline: only using circumlocutions 05/16/2022 Goal status: DISCONTINUED, no viable strategies identified as successful  05/16/2022 2.  To maximize functional communication across all  communication contexts using AAC and multimodal means of communication. Baseline: 25% Goal status: INITIAL      ASSESSMENT:  CLINICAL IMPRESSION: Pt demonstrated improved ability to locate information on SGD with verbal prompts only during today's session. Pt with noted improved ease of use during today's session. Subjectively pt appeared more confident with use as he found information readily.   OBJECTIVE IMPAIRMENTS include expressive language and aphasia. These impairments are limiting patient from effectively communicating at home and in community. Factors affecting potential to achieve goals and functional outcome are  time post onset (10 years) and previous ST intervention . Patient will benefit from skilled SLP services to address above impairments and improve overall function.  REHAB POTENTIAL: Fair time post onset (10 years)  PLAN: SLP FREQUENCY: 1-2x/week  SLP DURATION:  8 weeks  PLANNED INTERVENTIONS: Language facilitation, Internal/external aids, SLP instruction and feedback, Compensatory strategies, and Patient/family education   Peaches Vanoverbeke B. Rutherford Nail, M.S., CCC-SLP, Mining engineer Certified Brain Injury [place]  [place] Office [phone] Ascom [phone] Fax [phone]

## 2022-06-26 ENCOUNTER — Ambulatory Visit: Payer: Medicare HMO | Admitting: Speech Pathology

## 2022-06-26 DIAGNOSIS — R4701 Aphasia: Secondary | ICD-10-CM

## 2022-06-26 DIAGNOSIS — R482 Apraxia: Secondary | ICD-10-CM

## 2022-06-26 DIAGNOSIS — I63512 Cerebral infarction due to unspecified occlusion or stenosis of left middle cerebral artery: Secondary | ICD-10-CM

## 2022-06-26 NOTE — Therapy (Signed)
OUTPATIENT SPEECH LANGUAGE PATHOLOGY TREATMENT    Patient Name: Paul Bass MRN: [id number] DOB:08-11-1945, 76 y.o., male Today's Date: 06/26/2022  PCP: [name], NP REFERRING PROVIDER: Jennings Books, MD  END OF SESSION:   End of Session - 06/26/22 0956     Visit Number 17    Number of Visits 57    Date for SLP Re-Evaluation 08/15/22    Authorization Type [employer] HMO    Authorization Time Period 03/30/2022 thru 08/21/2022    Authorization - Visit Number 76    Authorization - Number of Visits 43    Progress Note Due on Visit 64    SLP Start Time 1000    SLP Stop Time  1100    SLP Time Calculation (min) 60 min    Activity Tolerance Patient tolerated treatment well                  Past Medical History:  Diagnosis Date   Accelerated junctional rhythm    Anemia    Anxiety    a.) Tx'd with BZO PRN   Arthritis    Bilateral carotid artery disease ([name])    BPH (benign prostatic hypertrophy)    CAD S/P percutaneous coronary angioplasty    a.) PCI in 2002 placing a RCA stent (unknown type). b.) LHC 12/07/2015: 5% ISR m-dRCA, 90% dRCA, 25% pRCA, 25% p-mLCx, 100% OM3, 95% oOM2-OM2, 75% m-dLAD; refer to CVTS. c.) 3v CABG 01/03/2016   CHF (congestive heart failure) ([place])    a.) TTE 05/12/2012: EF 50-55%; mild LA enlargement; G1DD. b.)  TTE 11/26/2015: EF 45%; mild BAE; triv PR, mild MR/TR; inferolateral HK; G1DD. c.)  TTE 11/07/2017: EF 35%; RV enlargement; BAE; inferior and inferolateral HK; triv PR; mild MR/TR. d.)  TTE 09/17/2019: EF 35%; mild LVH; triv MR/TR. e.)  TTE 08/11/2021: EF 50%; triv MR/TR; G1DD.   Cognitive deficit following cerebrovascular accident (CVA)    COPD (chronic obstructive pulmonary disease) ([name])    Cortical cataract    Depression    Dyspnea    GERD (gastroesophageal reflux disease)    History of 2019 novel coronavirus disease (COVID-19) 01/19/2021   History of acute inferior wall MI 2002   a.) PCI was performed placing a RCA  stent (unknown type)   History of kidney stones    HTN (hypertension)    Hyperlipidemia    Hypothyroidism    Long term current use of antithrombotics/antiplatelets    a.) DAPT therapy (ASA + clopidogrel)   OSA (obstructive sleep apnea)    a.) does not require nocturnal PAP therapy   PLMD (periodic limb movement disorder)    Postoperative atrial fibrillation ([place]) 01/03/2016   a.) following CABG procedure   Prostatitis    PVD (peripheral vascular disease) ([place])    S/P CABG x 3 01/03/2016   a.) LIMA-LAD, SVG-PDA, SVG-OM2   Stroke ([place]) 04/2012   a.) LEFT M1 occlusion from possible moderate LEFT ICA stenosis; Tx with TPA + mechanical embolectomy with Trevo Provue retrieval device with full recanalization and proximal LEFT ICA rescue stent. b/) residual RIGHT sided weakness   T2DM (type 2 diabetes mellitus) ([place])    Tobacco abuse    Past Surgical History:  Procedure Laterality Date   CARDIAC CATHETERIZATION N/A 12/07/2015   Procedure: Left Heart Cath and Coronary Angiography;  Surgeon: Corey Skains, MD;  Location: [facility];  Service: Cardiovascular;  Laterality: N/A;   CAROTID ANGIOGRAPHY N/A 06/05/2022   Procedure: CAROTID ANGIOGRAPHY;  Surgeon: Algernon Huxley, MD;  Location: [facility];  Service: Cardiovascular;  Laterality: N/A;   CAROTID PTA/STENT INTERVENTION Left    HX: 2 stents   CATARACT EXTRACTION W/PHACO Left 03/24/2021   Procedure: CATARACT EXTRACTION PHACO AND INTRAOCULAR LENS PLACEMENT (IOC) LEFT DIABETIC 6.67 00:42.5;  Surgeon: Birder Robson, MD;  Location: [facility];  Service: Ophthalmology;  Laterality: Left;   COLONOSCOPY WITH PROPOFOL N/A 02/18/2019   Procedure: COLONOSCOPY WITH PROPOFOL;  Surgeon: Lucilla Lame, MD;  Location: [facility];  Service: Endoscopy;  Laterality: N/A;   COLONOSCOPY WITH PROPOFOL N/A 12/22/2021   Procedure: COLONOSCOPY WITH PROPOFOL;  Surgeon: Lucilla Lame, MD;  Location: [facility];  Service: Endoscopy;   Laterality: N/A;   CORONARY ANGIOPLASTY WITH STENT PLACEMENT Left 2002   CORONARY ARTERY BYPASS GRAFT N/A 01/03/2016   Procedure: 3v CORONARY ARTERY BYPASS GRAFT (LIMA-LAD, SVG-PDA, SVG-OM2); Location: [facility]; Surgeon: Dwaine Deter, MD   CYSTOSCOPY W/ URETERAL STENT REMOVAL N/A 10/04/2021   Procedure: CYSTOSCOPY WITH PROSTATE FOREIGN BODY REMOVAL;  Surgeon: Abbie Sons, MD;  Location: [facility];  Service: Urology;  Laterality: N/A;   CYSTOSCOPY WITH INSERTION OF UROLIFT N/A 04/29/2019   Procedure: CYSTOSCOPY WITH INSERTION OF UROLIFT;  Surgeon: Abbie Sons, MD;  Location: [facility];  Service: Urology;  Laterality: N/A;   ESOPHAGOGASTRODUODENOSCOPY (EGD) WITH PROPOFOL N/A 09/11/2016   Procedure: ESOPHAGOGASTRODUODENOSCOPY (EGD) WITH PROPOFOL;  Surgeon: Lucilla Lame, MD;  Location: [facility];  Service: Endoscopy;  Laterality: N/A;   ESOPHAGOGASTRODUODENOSCOPY (EGD) WITH PROPOFOL N/A 10/17/2016   Procedure: ESOPHAGOGASTRODUODENOSCOPY (EGD) WITH PROPOFOL;  Surgeon: Lucilla Lame, MD;  Location: [facility];  Service: Endoscopy;  Laterality: N/A;   ESOPHAGOGASTRODUODENOSCOPY (EGD) WITH PROPOFOL N/A 11/13/2017   Procedure: ESOPHAGOGASTRODUODENOSCOPY (EGD) WITH PROPOFOL;  Surgeon: Lucilla Lame, MD;  Location: [facility];  Service: Endoscopy;  Laterality: N/A;   pins R lower leg Left    rotator cuff replaced Right    SAVORY DILATION N/A 10/17/2016   Procedure: SAVORY DILATION;  Surgeon: Lucilla Lame, MD;  Location: [facility];  Service: Endoscopy;  Laterality: N/A;   TRANSURETHRAL INCISION OF PROSTATE N/A 10/04/2021   Procedure: TRANSURETHRAL INCISION OF THE PROSTATE (TUIP);  Surgeon: Abbie Sons, MD;  Location: [facility];  Service: Urology;  Laterality: N/A;   Patient Active Problem List   Diagnosis Date Noted   Carotid stenosis, symptomatic, with infarction ([place]) 06/05/2022   History of colonic polyps    Anxiety state 11/17/2021   Cognitive deficit, post-stroke 11/17/2021    Ex-smoker 11/17/2021   Bradycardia 29/52/8413   Chronic systolic CHF (congestive heart failure), NYHA class 2 ([place]) 05/27/2020   Accelerated junctional rhythm 04/21/2019   Encounter for screening colonoscopy    Polyp of colon    Atherosclerotic heart disease of native coronary artery without angina pectoris 01/14/2019   Depression 01/14/2019   GERD (gastroesophageal reflux disease) 01/14/2019   Periodic limb movement disorder (PLMD) 01/14/2019   Sleep apnea 01/14/2019   Type 2 diabetes mellitus without complications ([place]) 24/40/1027   Stricture and stenosis of esophagus    Problems with swallowing and mastication    Food impaction of esophagus    H/O coronary artery bypass surgery 01/17/2016   Cerebrovascular accident (CVA) due to embolism ([place]) 01/03/2016   Hypothyroidism, unspecified 01/03/2016   HLD (hyperlipidemia) 01/03/2016   Stable angina 11/29/2015   Benign essential hypertension 05/27/2015   Bilateral carotid artery stenosis 02/24/2015   Atherosclerotic peripheral vascular disease ([place]) 09/07/2014   Bladder calculus 08/12/2014   Primary osteoarthritis  of left knee 03/10/2014   Elevated prostate specific antigen (PSA) 02/05/2013   Snoring 01/22/2013   Sleepiness 01/22/2013   Dysphagia, unspecified(787.20) 01/22/2013   Occlusion and stenosis of carotid artery without mention of cerebral infarction 01/22/2013   Cerebral thrombosis with cerebral infarction ([place]) 01/22/2013   Benign prostatic hyperplasia with incomplete bladder emptying 08/26/2012   Chronic prostatitis 08/26/2012   Family history of malignant neoplasm of prostate 08/26/2012   Incomplete emptying of bladder 08/26/2012   Dysphagia 05/14/2012   Global aphasia 05/14/2012   Stroke, acute, thrombotic ([place]) 05/12/2012   Symptomatic carotid artery stenosis 05/12/2012   HTN (hypertension) 05/12/2012   COPD (chronic obstructive pulmonary disease) ([place]) 05/12/2012   Acute respiratory failure ([place]) 05/12/2012     ONSET DATE: 05/13/2012  REFERRING DIAG: G25.427 (ICD-10-CM) - Cognitive deficit, post-stroke   PERTINENT HISTORY: Pt is a 76 year old male with past medical hx of anxiety, COP, depression, diabetes mellitus without complication, HTN, hyperlipidemia.    Of note, pt received Outpatient ST services for a total of 28 sessions ending on 03/24/2020. At the end of that POC pt required moderate assistance.      DIAGNOSTIC FINDINGS:  MRI 10/03/2019  No acute intracranial abnormality. 2. Moderate-sized chronic left MCA infarct. 3. Mild chronic small vessel ischemic disease, mildly progressed from 2014. 4. Progressive cerebral atrophy.  THERAPY DIAG:  Aphasia  Apraxia  Left middle cerebral artery stroke [facility])  Rationale for Evaluation and Treatment Rehabilitation  SUBJECTIVE: pt received his device from [place] and brought into session  Pt accompanied by: self  PAIN:  Are you having pain? No  PATIENT GOALS: he would like to be able to communicate better   OBJECTIVE:   TODAY'S TREATMENT: Skilled treatment session targeted pt's word finding and communication breakdown goals. SLP facilitated session by providing the following interventions:  Pt with improved ability to locate information on SGD. When presented with prompt, "you need to have your medication refilled...Marland KitchenMarland Kitchen" where would you find that information, pt able to locate such information in 9 out of 10 opportunities with rare Min A.      PATIENT EDUCATION: Education details: SGD Person educated: Patient and wife (via text) Education method: Explanation and Handouts Education comprehension: verbalized understanding  GOALS: Goals reviewed with patient? Yes   SHORT TERM GOALS: Target date: 10 sessions  03/30/2022 - 05/16/2022 Pt will learn strategies (such as drawing, gestures) to aid during communication breakdowns.  Baseline: only using circumlocutions 05/16/2022 - Goal status: DISCONTINUED, pt unable d/t  motor deficits   2.  With minimal assistance, pt will use strategies to help during communication breakdowns.  Baseline: new goal 05/16/2022 Goal status: DISCONTINUED, no viable strategies identified as successful  05/16/2022  3.  Pt will respond to general questions using AAC/nonverbal communication effectively with 50% accuracy and mod cues.  Baseline: 25% Goal Status: INITIAL  4.  Pt will use AAC to communicate within group situations with 50% accuracy given Mod to Min cues.   Baseline: 15% Goal Status: INITIAL  5.   Pt will use AAC to communicate in a structured conversation setting with 75% accuracy and min cues.  Baseline: 25% Goal Status: INITIAL     LONG TERM GOALS: Target date: 08/15/2022   03/30/2022 thru 05/16/2022 Pt will utilize strategies to aid in communication breakdowns with Mod I.  Baseline: only using circumlocutions 05/16/2022 Goal status: DISCONTINUED, no viable strategies identified as successful  05/16/2022 2.  To maximize functional communication across all communication contexts using AAC and  multimodal means of communication. Baseline: 25% Goal status: INITIAL      ASSESSMENT:  CLINICAL IMPRESSION: Pt demonstrated improved ability to locate information on SGD with verbal prompts only during today's session. Pt with noted improved ease of use during today's session. Subjectively pt appeared more confident with use as he found information readily.   OBJECTIVE IMPAIRMENTS include expressive language and aphasia. These impairments are limiting patient from effectively communicating at home and in community. Factors affecting potential to achieve goals and functional outcome are  time post onset (10 years) and previous ST intervention . Patient will benefit from skilled SLP services to address above impairments and improve overall function.  REHAB POTENTIAL: Fair time post onset (10 years)  PLAN: SLP FREQUENCY: 1-2x/week  SLP DURATION: 8  weeks  PLANNED INTERVENTIONS: Language facilitation, Internal/external aids, SLP instruction and feedback, Compensatory strategies, and Patient/family education   Zakarie Sturdivant B. Rutherford Nail, M.S., CCC-SLP, Mining engineer Certified Brain Injury [place]  [place] Office [phone] Ascom [phone] Fax [phone]

## 2022-06-28 ENCOUNTER — Ambulatory Visit: Payer: Medicare HMO | Admitting: Speech Pathology

## 2022-06-28 DIAGNOSIS — R482 Apraxia: Secondary | ICD-10-CM

## 2022-06-28 DIAGNOSIS — R4701 Aphasia: Secondary | ICD-10-CM

## 2022-06-28 DIAGNOSIS — I63512 Cerebral infarction due to unspecified occlusion or stenosis of left middle cerebral artery: Secondary | ICD-10-CM

## 2022-06-28 DIAGNOSIS — N200 Calculus of kidney: Secondary | ICD-10-CM | POA: Diagnosis not present

## 2022-06-29 ENCOUNTER — Encounter (INDEPENDENT_AMBULATORY_CARE_PROVIDER_SITE_OTHER): Payer: Self-pay | Admitting: Nurse Practitioner

## 2022-06-29 ENCOUNTER — Ambulatory Visit (INDEPENDENT_AMBULATORY_CARE_PROVIDER_SITE_OTHER): Payer: Medicare HMO

## 2022-06-29 ENCOUNTER — Ambulatory Visit (INDEPENDENT_AMBULATORY_CARE_PROVIDER_SITE_OTHER): Payer: Medicare HMO | Admitting: Nurse Practitioner

## 2022-06-29 ENCOUNTER — Other Ambulatory Visit (INDEPENDENT_AMBULATORY_CARE_PROVIDER_SITE_OTHER): Payer: Self-pay | Admitting: Vascular Surgery

## 2022-06-29 VITALS — BP 126/69 | HR 61 | Resp 16 | Wt 213.4 lb

## 2022-06-29 DIAGNOSIS — I2510 Atherosclerotic heart disease of native coronary artery without angina pectoris: Secondary | ICD-10-CM

## 2022-06-29 DIAGNOSIS — I10 Essential (primary) hypertension: Secondary | ICD-10-CM

## 2022-06-29 DIAGNOSIS — I6523 Occlusion and stenosis of bilateral carotid arteries: Secondary | ICD-10-CM

## 2022-06-29 NOTE — Therapy (Signed)
OUTPATIENT SPEECH LANGUAGE PATHOLOGY TREATMENT    Patient Name: Paul Bass MRN: [id number] DOB:[birth year], 76 y.o., male Today's Date: 06/29/2022  PCP: [name], NP REFERRING PROVIDER: Jennings Books, MD  END OF SESSION:   End of Session - 06/29/22 1246     Visit Number 18    Number of Visits 49    Date for SLP Re-Evaluation 08/15/22    Authorization Type [employer]    Authorization Time Period 03/30/2022 thru 08/21/2022    Authorization - Visit Number 80    Authorization - Number of Visits 43    Progress Note Due on Visit 75    SLP Start Time 0900    SLP Stop Time  1000    SLP Time Calculation (min) 60 min    Activity Tolerance Patient tolerated treatment well                  Past Medical History:  Diagnosis Date   Accelerated junctional rhythm    Anemia    Anxiety    a.) Tx'd with BZO PRN   Arthritis    Bilateral carotid artery disease ([name])    BPH (benign prostatic hypertrophy)    CAD S/P percutaneous coronary angioplasty    a.) PCI in 2002 placing a RCA stent (unknown type). b.) LHC 12/07/2015: 5% ISR m-dRCA, 90% dRCA, 25% pRCA, 25% p-mLCx, 100% OM3, 95% oOM2-OM2, 75% m-dLAD; refer to CVTS. c.) 3v CABG 01/03/2016   CHF (congestive heart failure) ([place])    a.) TTE 05/12/2012: EF 50-55%; mild LA enlargement; G1DD. b.)  TTE 11/26/2015: EF 45%; mild BAE; triv PR, mild MR/TR; inferolateral HK; G1DD. c.)  TTE 11/07/2017: EF 35%; RV enlargement; BAE; inferior and inferolateral HK; triv PR; mild MR/TR. d.)  TTE 09/17/2019: EF 35%; mild LVH; triv MR/TR. e.)  TTE 08/11/2021: EF 50%; triv MR/TR; G1DD.   Cognitive deficit following cerebrovascular accident (CVA)    COPD (chronic obstructive pulmonary disease) ([name])    Cortical cataract    Depression    Dyspnea    GERD (gastroesophageal reflux disease)    History of 2019 novel coronavirus disease (COVID-19) 01/19/2021   History of acute inferior wall MI 2002   a.) PCI was performed placing a RCA  stent (unknown type)   History of kidney stones    HTN (hypertension)    Hyperlipidemia    Hypothyroidism    Long term current use of antithrombotics/antiplatelets    a.) DAPT therapy (ASA + clopidogrel)   OSA (obstructive sleep apnea)    a.) does not require nocturnal PAP therapy   PLMD (periodic limb movement disorder)    Postoperative atrial fibrillation ([place]) 01/03/2016   a.) following CABG procedure   Prostatitis    PVD (peripheral vascular disease) ([place])    S/P CABG x 3 01/03/2016   a.) LIMA-LAD, SVG-PDA, SVG-OM2   Stroke ([place]) 04/2012   a.) LEFT M1 occlusion from possible moderate LEFT ICA stenosis; Tx with TPA + mechanical embolectomy with Trevo Provue retrieval device with full recanalization and proximal LEFT ICA rescue stent. b/) residual RIGHT sided weakness   T2DM (type 2 diabetes mellitus) ([place])    Tobacco abuse    Past Surgical History:  Procedure Laterality Date   CARDIAC CATHETERIZATION N/A 12/07/2015   Procedure: Left Heart Cath and Coronary Angiography;  Surgeon: Corey Skains, MD;  Location: [facility];  Service: Cardiovascular;  Laterality: N/A;   CAROTID ANGIOGRAPHY N/A 06/05/2022   Procedure: CAROTID ANGIOGRAPHY;  Surgeon: Algernon Huxley, MD;  Location: [facility];  Service: Cardiovascular;  Laterality: N/A;   CAROTID PTA/STENT INTERVENTION Left    HX: 2 stents   CATARACT EXTRACTION W/PHACO Left 03/24/2021   Procedure: CATARACT EXTRACTION PHACO AND INTRAOCULAR LENS PLACEMENT (IOC) LEFT DIABETIC 6.67 00:42.5;  Surgeon: Birder Robson, MD;  Location: [facility];  Service: Ophthalmology;  Laterality: Left;   COLONOSCOPY WITH PROPOFOL N/A 02/18/2019   Procedure: COLONOSCOPY WITH PROPOFOL;  Surgeon: Lucilla Lame, MD;  Location: [facility];  Service: Endoscopy;  Laterality: N/A;   COLONOSCOPY WITH PROPOFOL N/A 12/22/2021   Procedure: COLONOSCOPY WITH PROPOFOL;  Surgeon: Lucilla Lame, MD;  Location: [facility];  Service: Endoscopy;   Laterality: N/A;   CORONARY ANGIOPLASTY WITH STENT PLACEMENT Left 2002   CORONARY ARTERY BYPASS GRAFT N/A 01/03/2016   Procedure: 3v CORONARY ARTERY BYPASS GRAFT (LIMA-LAD, SVG-PDA, SVG-OM2); Location: [facility]; Surgeon: Dwaine Deter, MD   CYSTOSCOPY W/ URETERAL STENT REMOVAL N/A 10/04/2021   Procedure: CYSTOSCOPY WITH PROSTATE FOREIGN BODY REMOVAL;  Surgeon: Abbie Sons, MD;  Location: [facility];  Service: Urology;  Laterality: N/A;   CYSTOSCOPY WITH INSERTION OF UROLIFT N/A 04/29/2019   Procedure: CYSTOSCOPY WITH INSERTION OF UROLIFT;  Surgeon: Abbie Sons, MD;  Location: [facility];  Service: Urology;  Laterality: N/A;   ESOPHAGOGASTRODUODENOSCOPY (EGD) WITH PROPOFOL N/A 09/11/2016   Procedure: ESOPHAGOGASTRODUODENOSCOPY (EGD) WITH PROPOFOL;  Surgeon: Lucilla Lame, MD;  Location: [facility];  Service: Endoscopy;  Laterality: N/A;   ESOPHAGOGASTRODUODENOSCOPY (EGD) WITH PROPOFOL N/A 10/17/2016   Procedure: ESOPHAGOGASTRODUODENOSCOPY (EGD) WITH PROPOFOL;  Surgeon: Lucilla Lame, MD;  Location: [facility];  Service: Endoscopy;  Laterality: N/A;   ESOPHAGOGASTRODUODENOSCOPY (EGD) WITH PROPOFOL N/A 11/13/2017   Procedure: ESOPHAGOGASTRODUODENOSCOPY (EGD) WITH PROPOFOL;  Surgeon: Lucilla Lame, MD;  Location: [facility];  Service: Endoscopy;  Laterality: N/A;   pins R lower leg Left    rotator cuff replaced Right    SAVORY DILATION N/A 10/17/2016   Procedure: SAVORY DILATION;  Surgeon: Lucilla Lame, MD;  Location: [facility];  Service: Endoscopy;  Laterality: N/A;   TRANSURETHRAL INCISION OF PROSTATE N/A 10/04/2021   Procedure: TRANSURETHRAL INCISION OF THE PROSTATE (TUIP);  Surgeon: Abbie Sons, MD;  Location: [facility];  Service: Urology;  Laterality: N/A;   Patient Active Problem List   Diagnosis Date Noted   Carotid stenosis, symptomatic, with infarction ([place]) 06/05/2022   History of colonic polyps    Anxiety state 11/17/2021   Cognitive deficit, post-stroke 11/17/2021    Ex-smoker 11/17/2021   Bradycardia 29/52/8413   Chronic systolic CHF (congestive heart failure), NYHA class 2 ([place]) 05/27/2020   Accelerated junctional rhythm 04/21/2019   Encounter for screening colonoscopy    Polyp of colon    Atherosclerotic heart disease of native coronary artery without angina pectoris 01/14/2019   Depression 01/14/2019   GERD (gastroesophageal reflux disease) 01/14/2019   Periodic limb movement disorder (PLMD) 01/14/2019   Sleep apnea 01/14/2019   Type 2 diabetes mellitus without complications ([place]) 24/40/1027   Stricture and stenosis of esophagus    Problems with swallowing and mastication    Food impaction of esophagus    H/O coronary artery bypass surgery 01/17/2016   Cerebrovascular accident (CVA) due to embolism ([place]) 01/03/2016   Hypothyroidism, unspecified 01/03/2016   HLD (hyperlipidemia) 01/03/2016   Stable angina 11/29/2015   Benign essential hypertension 05/27/2015   Bilateral carotid artery stenosis 02/24/2015   Atherosclerotic peripheral vascular disease ([place]) 09/07/2014   Bladder calculus 08/12/2014   Primary osteoarthritis  of left knee 03/10/2014   Elevated prostate specific antigen (PSA) 02/05/2013   Snoring 01/22/2013   Sleepiness 01/22/2013   Dysphagia, unspecified(787.20) 01/22/2013   Occlusion and stenosis of carotid artery without mention of cerebral infarction 01/22/2013   Cerebral thrombosis with cerebral infarction ([place]) 01/22/2013   Benign prostatic hyperplasia with incomplete bladder emptying 08/26/2012   Chronic prostatitis 08/26/2012   Family history of malignant neoplasm of prostate 08/26/2012   Incomplete emptying of bladder 08/26/2012   Dysphagia 05/14/2012   Global aphasia 05/14/2012   Stroke, acute, thrombotic ([place]) 05/12/2012   Symptomatic carotid artery stenosis 05/12/2012   HTN (hypertension) 05/12/2012   COPD (chronic obstructive pulmonary disease) ([place]) 05/12/2012   Acute respiratory failure ([place]) 05/12/2012     ONSET DATE: 05/13/2012  REFERRING DIAG: A63.016 (ICD-10-CM) - Cognitive deficit, post-stroke   PERTINENT HISTORY: Pt is a 76 year old male with past medical hx of anxiety, COP, depression, diabetes mellitus without complication, HTN, hyperlipidemia.    Of note, pt received Outpatient ST services for a total of 28 sessions ending on 03/24/2020. At the end of that POC pt required moderate assistance.      DIAGNOSTIC FINDINGS:  MRI 10/03/2019  No acute intracranial abnormality. 2. Moderate-sized chronic left MCA infarct. 3. Mild chronic small vessel ischemic disease, mildly progressed from 2014. 4. Progressive cerebral atrophy.  THERAPY DIAG:  Aphasia  Apraxia  Left middle cerebral artery stroke [facility])  Rationale for Evaluation and Treatment Rehabilitation  SUBJECTIVE: "it went good" - pt referring to using device to order food at restaurants  Pt accompanied by: self  PAIN:  Are you having pain? No  PATIENT GOALS: he would like to be able to communicate better   OBJECTIVE:   TODAY'S TREATMENT: Skilled treatment session targeted pt's word finding and communication breakdown goals. SLP facilitated session by providing the following interventions:  With pt's assistance, SLP updated pt's device to reflect new doctor as well as add vascular surgeon. Also added list of medical conditions.      PATIENT EDUCATION: Education details: SGD Person educated: Patient and wife (via text) Education method: Explanation and Handouts Education comprehension: verbalized understanding  HOME EXERCISE PROGRAM:   Note was written to his wife request they eat at one of the restaurants on his SGD to promote ability with device   GOALS: Goals reviewed with patient? Yes   SHORT TERM GOALS: Target date: 10 sessions  03/30/2022 - 05/16/2022 Pt will learn strategies (such as drawing, gestures) to aid during communication breakdowns.  Baseline: only using circumlocutions 05/16/2022 -  Goal status: DISCONTINUED, pt unable d/t motor deficits   2.  With minimal assistance, pt will use strategies to help during communication breakdowns.  Baseline: new goal 05/16/2022 Goal status: DISCONTINUED, no viable strategies identified as successful  05/16/2022  3.  Pt will respond to general questions using AAC/nonverbal communication effectively with 50% accuracy and mod cues.  Baseline: 25% Goal Status: INITIAL  4.  Pt will use AAC to communicate within group situations with 50% accuracy given Mod to Min cues.   Baseline: 15% Goal Status: INITIAL  5.   Pt will use AAC to communicate in a structured conversation setting with 75% accuracy and min cues.  Baseline: 25% Goal Status: INITIAL     LONG TERM GOALS: Target date: 08/15/2022   03/30/2022 thru 05/16/2022 Pt will utilize strategies to aid in communication breakdowns with Mod I.  Baseline: only using circumlocutions 05/16/2022 Goal status: DISCONTINUED, no viable strategies identified as successful  05/16/2022 2.  To maximize functional communication across all communication contexts using AAC and multimodal means of communication. Baseline: 25% Goal status: INITIAL      ASSESSMENT:  CLINICAL IMPRESSION: Via text with this SLP, pt's wife reports that pt used device to order his meal at 2 local restaurants. Pt continues to be a good candidate for use of SGD.   OBJECTIVE IMPAIRMENTS include expressive language and aphasia. These impairments are limiting patient from effectively communicating at home and in community. Factors affecting potential to achieve goals and functional outcome are  time post onset (10 years) and previous ST intervention . Patient will benefit from skilled SLP services to address above impairments and improve overall function.  REHAB POTENTIAL: Fair time post onset (10 years)  PLAN: SLP FREQUENCY: 1-2x/week  SLP DURATION: 8 weeks  PLANNED INTERVENTIONS: Language facilitation,  Internal/external aids, SLP instruction and feedback, Compensatory strategies, and Patient/family education   Xhaiden Coombs B. Rutherford Nail, M.S., CCC-SLP, Mining engineer Certified Brain Injury [place]  [place] Office [phone] Ascom [phone] Fax [phone]

## 2022-07-03 ENCOUNTER — Ambulatory Visit: Payer: Medicare HMO | Admitting: Speech Pathology

## 2022-07-04 ENCOUNTER — Ambulatory Visit: Payer: Medicare HMO | Attending: Neurology | Admitting: Speech Pathology

## 2022-07-04 DIAGNOSIS — I63512 Cerebral infarction due to unspecified occlusion or stenosis of left middle cerebral artery: Secondary | ICD-10-CM | POA: Diagnosis not present

## 2022-07-04 DIAGNOSIS — R482 Apraxia: Secondary | ICD-10-CM | POA: Insufficient documentation

## 2022-07-04 DIAGNOSIS — R4701 Aphasia: Secondary | ICD-10-CM | POA: Diagnosis not present

## 2022-07-04 NOTE — Therapy (Signed)
OUTPATIENT SPEECH LANGUAGE PATHOLOGY TREATMENT    Patient Name: Paul Bass MRN: [id number] DOB:06-Nov-1945, 76 y.o., male Today's Date: 06/29/2022  PCP: [name], NP REFERRING PROVIDER: Jennings Books, MD  END OF SESSION:   End of Session - 06/29/22 1246     Visit Number 18    Number of Visits 73    Date for SLP Re-Evaluation 08/15/22    Authorization Type [employer] H[employer]    Authorization Time Period 03/30/2022 thru 08/21/2022    Authorization - Visit Number 49    Authorization - Number of Visits 43    Progress Note Due on Visit 55    SLP Start Time 0900    SLP Stop Time  1000    SLP Time Calculation (min) 60 min    Activity Tolerance Patient tolerated treatment well                  Past Medical History:  Diagnosis Date   Accelerated junctional rhythm    Anemia    Anxiety    a.) Tx'd with BZO PRN   Arthritis    Bilateral carotid artery disease ([name])    BPH (benign prostatic hypertrophy)    CAD S/P percutaneous coronary angioplasty    a.) PCI in 2002 placing a RCA stent (unknown type). b.) LHC 12/07/2015: 5% ISR m-dRCA, 90% dRCA, 25% pRCA, 25% p-mLCx, 100% OM3, 95% oOM2-OM2, 75% m-dLAD; refer to CVTS. c.) 3v CABG 01/03/2016   CHF (congestive heart failure) ([place])    a.) TTE 05/12/2012: EF 50-55%; mild LA enlargement; G1DD. b.)  TTE 11/26/2015: EF 45%; mild BAE; triv PR, mild MR/TR; inferolateral HK; G1DD. c.)  TTE 11/07/2017: EF 35%; RV enlargement; BAE; inferior and inferolateral HK; triv PR; mild MR/TR. d.)  TTE 09/17/2019: EF 35%; mild LVH; triv MR/TR. e.)  TTE 08/11/2021: EF 50%; triv MR/TR; G1DD.   Cognitive deficit following cerebrovascular accident (CVA)    COPD (chronic obstructive pulmonary disease) ([name])    Cortical cataract    Depression    Dyspnea    GERD (gastroesophageal reflux disease)    History of 2019 novel coronavirus disease (COVID-19) 01/19/2021   History of acute inferior wall MI 2002   a.) PCI was performed placing a RCA  stent (unknown type)   History of kidney stones    HTN (hypertension)    Hyperlipidemia    Hypothyroidism    Long term current use of antithrombotics/antiplatelets    a.) DAPT therapy (ASA + clopidogrel)   OSA (obstructive sleep apnea)    a.) does not require nocturnal PAP therapy   PLMD (periodic limb movement disorder)    Postoperative atrial fibrillation ([place]) 01/03/2016   a.) following CABG procedure   Prostatitis    PVD (peripheral vascular disease) ([place])    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 b/) residual RIGHT sided weakness   T2DM (type 2 diabetes mellitus) ([place])    Tobacco abuse    Past Surgical History:  Procedure Laterality Date   CARDIAC CATHETERIZATION N/A 12/07/2015   Procedure: Left Heart Cath and Coronary Angiography;  Surgeon: Corey Skains, MD;  Location: [facility];  Service: Cardiovascular;  Laterality: N/A;   CAROTID ANGIOGRAPHY N/A 06/05/2022   Procedure: CAROTID ANGIOGRAPHY;  Surgeon: Algernon Huxley, MD;  Location: [facility];  Service: Cardiovascular;  Laterality: N/A;   CAROTID PTA/STENT INTERVENTION Left    HX: 2 stents   CATARACT EXTRACTION W/PHACO Left 03/24/2021   Procedure: CATARACT EXTRACTION PHACO AND INTRAOCULAR LENS PLACEMENT (IOC) LEFT DIABETIC 6.67 00:42.5;  Surgeon: Birder Robson, MD;  Location: [facility];  Service: Ophthalmology;  Laterality: Left;   COLONOSCOPY WITH PROPOFOL N/A 02/18/2019   Procedure: COLONOSCOPY WITH PROPOFOL;  Surgeon: Lucilla Lame, MD;  Location: [facility];  Service: Endoscopy;  Laterality: N/A;   COLONOSCOPY WITH PROPOFOL N/A 12/22/2021   Procedure: COLONOSCOPY WITH PROPOFOL;  Surgeon: Lucilla Lame, MD;  Location: [facility];  Service: Endoscopy;   Laterality: N/A;   CORONARY ANGIOPLASTY WITH STENT PLACEMENT Left 2002   CORONARY ARTERY BYPASS GRAFT N/A 01/03/2016   Procedure: 3v CORONARY ARTERY BYPASS GRAFT (LIMA-LAD, SVG-PDA, SVG-OM2); Location: [facility]; Surgeon: Dwaine Deter, MD   CYSTOSCOPY W/ URETERAL STENT REMOVAL N/A 10/04/2021   Procedure: CYSTOSCOPY WITH PROSTATE FOREIGN BODY REMOVAL;  Surgeon: Abbie Sons, MD;  Location: [facility];  Service: Urology;  Laterality: N/A;   CYSTOSCOPY WITH INSERTION OF UROLIFT N/A 04/29/2019   Procedure: CYSTOSCOPY WITH INSERTION OF UROLIFT;  Surgeon: Abbie Sons, MD;  Location: [facility];  Service: Urology;  Laterality: N/A;   ESOPHAGOGASTRODUODENOSCOPY (EGD) WITH PROPOFOL N/A 09/11/2016   Procedure: ESOPHAGOGASTRODUODENOSCOPY (EGD) WITH PROPOFOL;  Surgeon: Lucilla Lame, MD;  Location: [facility];  Service: Endoscopy;  Laterality: N/A;   ESOPHAGOGASTRODUODENOSCOPY (EGD) WITH PROPOFOL N/A 10/17/2016   Procedure: ESOPHAGOGASTRODUODENOSCOPY (EGD) WITH PROPOFOL;  Surgeon: Lucilla Lame, MD;  Location: [facility];  Service: Endoscopy;  Laterality: N/A;   ESOPHAGOGASTRODUODENOSCOPY (EGD) WITH PROPOFOL N/A 11/13/2017   Procedure: ESOPHAGOGASTRODUODENOSCOPY (EGD) WITH PROPOFOL;  Surgeon: Lucilla Lame, MD;  Location: [facility];  Service: Endoscopy;  Laterality: N/A;   pins R lower leg Left    rotator cuff replaced Right    SAVORY DILATION N/A 10/17/2016   Procedure: SAVORY DILATION;  Surgeon: Lucilla Lame, MD;  Location: [facility];  Service: Endoscopy;  Laterality: N/A;   TRANSURETHRAL INCISION OF PROSTATE N/A 10/04/2021   Procedure: TRANSURETHRAL INCISION OF THE PROSTATE (TUIP);  Surgeon: Abbie Sons, MD;  Location: [facility];  Service: Urology;  Laterality: N/A;   Patient Active Problem List   Diagnosis Date Noted   Carotid stenosis, symptomatic, with infarction ([place]) 06/05/2022   History of colonic polyps    Anxiety state 11/17/2021   Cognitive deficit, post-stroke 11/17/2021    Ex-smoker 11/17/2021   Bradycardia 29/52/8413   Chronic systolic CHF (congestive heart failure), NYHA class 2 ([place]) 05/27/2020   Accelerated junctional rhythm 04/21/2019   Encounter for screening colonoscopy    Polyp of colon    Atherosclerotic heart disease of native coronary artery without angina pectoris 01/14/2019   Depression 01/14/2019   GERD (gastroesophageal reflux disease) 01/14/2019   Periodic limb movement disorder (PLMD) 01/14/2019   Sleep apnea 01/14/2019   Type 2 diabetes mellitus without complications ([place]) 24/40/1027   Stricture and stenosis of esophagus    Problems with swallowing and mastication    Food impaction of esophagus    H/O coronary artery bypass surgery 01/17/2016   Cerebrovascular accident (CVA) due to embolism ([place]) 01/03/2016   Hypothyroidism, unspecified 01/03/2016   HLD (hyperlipidemia) 01/03/2016   Stable angina 11/29/2015   Benign essential hypertension 05/27/2015   Bilateral carotid artery stenosis 02/24/2015   Atherosclerotic peripheral vascular disease ([place]) 09/07/2014   Bladder calculus 08/12/2014   Primary osteoarthritis  of left knee 03/10/2014   Elevated prostate specific antigen (PSA) 02/05/2013   Snoring 01/22/2013   Sleepiness 01/22/2013   Dysphagia, unspecified(787.20) 01/22/2013   Occlusion and stenosis of carotid artery without mention of cerebral infarction 01/22/2013   Cerebral thrombosis with cerebral infarction ([place]) 01/22/2013   Benign prostatic hyperplasia with incomplete bladder emptying 08/26/2012   Chronic prostatitis 08/26/2012   Family history of malignant neoplasm of prostate 08/26/2012   Incomplete emptying of bladder 08/26/2012   Dysphagia 05/14/2012   Global aphasia 05/14/2012   Stroke, acute, thrombotic ([place]) 05/12/2012   Symptomatic carotid artery stenosis 05/12/2012   HTN (hypertension) 05/12/2012   COPD (chronic obstructive pulmonary disease) ([place]) 05/12/2012   Acute respiratory failure ([place]) 05/12/2012     ONSET DATE: 05/13/2012  REFERRING DIAG: O96.295 (ICD-10-CM) - Cognitive deficit, post-stroke   PERTINENT HISTORY: Pt is a 76 year old male with past medical hx of anxiety, COP, depression, diabetes mellitus without complication, HTN, hyperlipidemia.    Of note, pt received Outpatient ST services for a total of 28 sessions ending on 03/24/2020. At the end of that POC pt required moderate assistance.      DIAGNOSTIC FINDINGS:  MRI 10/03/2019  No acute intracranial abnormality. 2. Moderate-sized chronic left MCA infarct. 3. Mild chronic small vessel ischemic disease, mildly progressed from 2014. 4. Progressive cerebral atrophy.  THERAPY DIAG:  Aphasia  Apraxia  Left middle cerebral artery stroke [facility])  Rationale for Evaluation and Treatment Rehabilitation  SUBJECTIVE: Pt reports that he is NOT taking the Touch Talk with him to restaurants  Pt accompanied by: self  PAIN:  Are you having pain? No  PATIENT GOALS: he would like to be able to communicate better   OBJECTIVE:   TODAY'S TREATMENT: Skilled treatment session targeted pt's word finding and communication breakdown goals. SLP facilitated session by providing the following interventions:  Pt required moderate question cues to find the following information: Ask Vaughan Basta "how was your day?" list of medical conditions [facility] refill medications  Independently located: [place] names of pets Let's call [place] to independently intelligibly state his name, DOB, address and wife's contact information        PATIENT EDUCATION: Education details: note sent to pt's wife asking for a list of situations that pt has difficulty communicating in Person educated: Patient and wife (via text) Education method: Explanation and Handouts Education comprehension: verbalized understanding  HOME EXERCISE PROGRAM:   Note was written to his wife request they eat at one of the restaurants on his  SGD to promote ability with device   GOALS: Goals reviewed with patient? Yes   SHORT TERM GOALS: Target date: 10 sessions  03/30/2022 - 05/16/2022 Pt will learn strategies (such as drawing, gestures) to aid during communication breakdowns.  Baseline: only using circumlocutions 05/16/2022 - Goal status: DISCONTINUED, pt unable d/t motor deficits   2.  With minimal assistance, pt will use strategies to help during communication breakdowns.  Baseline: new goal 05/16/2022 Goal status: DISCONTINUED, no viable strategies identified as successful  05/16/2022  3.  Pt will respond to general questions using AAC/nonverbal communication effectively with 50% accuracy and mod cues.  Baseline: 25% Goal Status: INITIAL  4.  Pt will use AAC to communicate within group situations with 50% accuracy given Mod to Min cues.   Baseline: 15% Goal Status: INITIAL  5.   Pt will use AAC to communicate in a structured conversation setting with 75% accuracy and min cues.  Baseline: 25% Goal  Status: INITIAL     LONG TERM GOALS: Target date: 08/15/2022   03/30/2022 thru 05/16/2022 Pt will utilize strategies to aid in communication breakdowns with Mod I.  Baseline: only using circumlocutions 05/16/2022 Goal status: DISCONTINUED, no viable strategies identified as successful  05/16/2022 2.  To maximize functional communication across all communication contexts using AAC and multimodal means of communication. Baseline: 25% Goal status: INITIAL      ASSESSMENT:  CLINICAL IMPRESSION: When discussing use of SGD, pt's moderate expressive aphasia prevent shim from effectively communicating when he is using the device. Reached out to pt's wife via text for a date for her to attend session to promote use of device outside of session. Pt does state "when people are walking around (the restaurant), I am thinking in my head what I want but then I draw a blank."   OBJECTIVE IMPAIRMENTS include expressive  language and aphasia. These impairments are limiting patient from effectively communicating at home and in community. Factors affecting potential to achieve goals and functional outcome are  time post onset (10 years) and previous ST intervention . Patient will benefit from skilled SLP services to address above impairments and improve overall function.  REHAB POTENTIAL: Fair time post onset (10 years)  PLAN: SLP FREQUENCY: 1-2x/week  SLP DURATION: 8 weeks  PLANNED INTERVENTIONS: Language facilitation, Internal/external aids, SLP instruction and feedback, Compensatory strategies, and Patient/family education   Latangela Mccomas B. Rutherford Nail, M.S., CCC-SLP, Mining engineer Certified Brain Injury [place]  [place] Office [phone] Ascom [phone] Fax [phone]

## 2022-07-05 ENCOUNTER — Ambulatory Visit: Payer: Medicare HMO | Admitting: Speech Pathology

## 2022-07-07 ENCOUNTER — Ambulatory Visit: Payer: Medicare HMO | Admitting: Speech Pathology

## 2022-07-07 DIAGNOSIS — R482 Apraxia: Secondary | ICD-10-CM

## 2022-07-07 DIAGNOSIS — I10 Essential (primary) hypertension: Secondary | ICD-10-CM | POA: Diagnosis not present

## 2022-07-07 DIAGNOSIS — N35010 Post-traumatic urethral stricture, male, meatal: Secondary | ICD-10-CM | POA: Diagnosis not present

## 2022-07-07 DIAGNOSIS — R338 Other retention of urine: Secondary | ICD-10-CM | POA: Diagnosis not present

## 2022-07-07 DIAGNOSIS — R4701 Aphasia: Secondary | ICD-10-CM | POA: Diagnosis not present

## 2022-07-07 DIAGNOSIS — R3915 Urgency of urination: Secondary | ICD-10-CM | POA: Diagnosis not present

## 2022-07-07 DIAGNOSIS — N35912 Unspecified bulbous urethral stricture, male: Secondary | ICD-10-CM | POA: Diagnosis not present

## 2022-07-07 DIAGNOSIS — N401 Enlarged prostate with lower urinary tract symptoms: Secondary | ICD-10-CM | POA: Diagnosis not present

## 2022-07-07 DIAGNOSIS — I63512 Cerebral infarction due to unspecified occlusion or stenosis of left middle cerebral artery: Secondary | ICD-10-CM | POA: Diagnosis not present

## 2022-07-07 DIAGNOSIS — J449 Chronic obstructive pulmonary disease, unspecified: Secondary | ICD-10-CM | POA: Diagnosis not present

## 2022-07-07 DIAGNOSIS — E119 Type 2 diabetes mellitus without complications: Secondary | ICD-10-CM | POA: Diagnosis not present

## 2022-07-07 DIAGNOSIS — I2510 Atherosclerotic heart disease of native coronary artery without angina pectoris: Secondary | ICD-10-CM | POA: Diagnosis not present

## 2022-07-07 DIAGNOSIS — N35014 Post-traumatic urethral stricture, male, unspecified: Secondary | ICD-10-CM | POA: Insufficient documentation

## 2022-07-07 DIAGNOSIS — E039 Hypothyroidism, unspecified: Secondary | ICD-10-CM | POA: Diagnosis not present

## 2022-07-07 DIAGNOSIS — N390 Urinary tract infection, site not specified: Secondary | ICD-10-CM | POA: Diagnosis not present

## 2022-07-07 HISTORY — DX: Post-traumatic urethral stricture, male, unspecified: N35.014

## 2022-07-08 ENCOUNTER — Encounter (INDEPENDENT_AMBULATORY_CARE_PROVIDER_SITE_OTHER): Payer: Self-pay | Admitting: Nurse Practitioner

## 2022-07-08 NOTE — Progress Notes (Signed)
Subjective:    Patient ID: Paul Bass, male    DOB: February 05, 1946, 76 y.o.   MRN: [id number] Chief Complaint  Patient presents with   Follow-up    [facility] 3 week follow up    The patient is seen for follow up evaluation of carotid stenosis status post left carotid stent placement on 06/05/2022, for repair of a fractured previous stent to the left ICA.  There were no post operative problems or complications related to the surgery.  The patient denies neck or incisional pain.  The patient denies interval amaurosis fugax. There is no recent history of TIA symptoms or focal motor deficits. There is no prior documented CVA.  The patient denies headache.  The patient is taking enteric-coated aspirin 81 mg daily.  No recent shortening of the patient's walking distance or new symptoms consistent with claudication.  No history of rest pain symptoms. No new ulcers or wounds of the lower extremities have occurred.  There is no history of DVT, PE or superficial thrombophlebitis. No recent episodes of angina or shortness of breath documented.   The patient has 1 to 39% stenosis of the bilateral internal carotid arteries.  Less than 50% stenosis in the bilateral common carotid arteries.  Bilateral vertebral arteries have antegrade flow with normal flow hemodynamics in the bilateral subclavian arteries.    Review of Systems  All other systems reviewed and are negative.      Objective:   Physical Exam Vitals reviewed.  HENT:     Head: Normocephalic.  Neck:     Vascular: No carotid bruit.  Cardiovascular:     Rate and Rhythm: Normal rate.     Pulses: Normal pulses.  Pulmonary:     Effort: Pulmonary effort is normal.  Skin:    General: Skin is warm and dry.  Neurological:     Mental Status: He is alert and oriented to person, place, and time.  Psychiatric:        Mood and Affect: Mood normal.        Behavior: Behavior normal.        Thought Content: Thought content normal.         Judgment: Judgment normal.     BP 126/69 (BP Location: Left Arm)   Pulse 61   Resp 16   Wt 213 lb 6.4 oz (96.8 kg)   BMI 29.76 kg/m   Past Medical History:  Diagnosis Date   Accelerated junctional rhythm    Anemia    Anxiety    a.) Tx'd with BZO PRN   Arthritis    Bilateral carotid artery disease ([name])    BPH (benign prostatic hypertrophy)    CAD S/P percutaneous coronary angioplasty    a.) PCI in 2002 placing a RCA stent (unknown type). b.) LHC 12/07/2015: 5% ISR m-dRCA, 90% dRCA, 25% pRCA, 25% p-mLCx, 100% OM3, 95% oOM2-OM2, 75% m-dLAD; refer to CVTS. c.) 3v CABG 01/03/2016   CHF (congestive heart failure) ([place])    a.) TTE 05/12/2012: EF 50-55%; mild LA enlargement; G1DD. b.)  TTE 11/26/2015: EF 45%; mild BAE; triv PR, mild MR/TR; inferolateral HK; G1DD. c.)  TTE 11/07/2017: EF 35%; RV enlargement; BAE; inferior and inferolateral HK; triv PR; mild MR/TR. d.)  TTE 09/17/2019: EF 35%; mild LVH; triv MR/TR. e.)  TTE 08/11/2021: EF 50%; triv MR/TR; G1DD.   Cognitive deficit following cerebrovascular accident (CVA)    COPD (chronic obstructive pulmonary disease) ([name])    Cortical cataract    Depression  Dyspnea    GERD (gastroesophageal reflux disease)    History of 2019 novel coronavirus disease (COVID-19) 01/19/2021   History of acute inferior wall MI 2002   a.) PCI was performed placing a RCA stent (unknown type)   History of kidney stones    HTN (hypertension)    Hyperlipidemia    Hypothyroidism    Long term current use of antithrombotics/antiplatelets    a.) DAPT therapy (ASA + clopidogrel)   OSA (obstructive sleep apnea)    a.) does not require nocturnal PAP therapy   PLMD (periodic limb movement disorder)    Postoperative atrial fibrillation ([place]) 01/03/2016   a.) following CABG procedure   Prostatitis    PVD (peripheral vascular disease) ([place])    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 b/) residual RIGHT sided weakness   T2DM (type 2 diabetes mellitus) ([place])    Tobacco abuse     Social History   Socioeconomic History   Marital status: Married    Spouse name: Vaughan Basta   Number of children: 1   Years of education: HS   Highest education level: Not on file  Occupational History   Occupation: Retired    Comment: Part Time [employer]  Tobacco Use   Smoking status: Former    Packs/day: 2.00    Types: Cigarettes    Quit date: 05/20/2012    Years since quitting: 10.1   Smokeless tobacco: Never  Vaping Use   Vaping Use: Never used  Substance and Sexual Activity   Alcohol use: Not Currently    Comment: No alcohol since stroke   Drug use: Never   Sexual activity: Yes    Birth control/protection: None  Other Topics Concern   Not on file  Social History Narrative   Patient lives at home with his wife works at  [employer] has a 12 grade education with 1 child.   Patient quit smoking 05-11-2012 patient drinks alcoholic drinks he also drinks cafinted drinks daily.   Social Determinants of Health   Financial Resource Strain: Not on file  Food Insecurity: Not on file  Transportation Needs: Not on file  Physical Activity: Not on file  Stress: Not on file  Social Connections: Not on file  Intimate Partner Violence: Not on file    Past Surgical History:  Procedure Laterality Date   CARDIAC CATHETERIZATION N/A 12/07/2015   Procedure: Left Heart Cath and Coronary Angiography;  Surgeon: Corey Skains, MD;  Location: [place] [facility];  Service: Cardiovascular;  Laterality: N/A;   CAROTID ANGIOGRAPHY N/A 06/05/2022   Procedure: CAROTID ANGIOGRAPHY;  Surgeon: Algernon Huxley, MD;  Location: [place] [facility];  Service: Cardiovascular;  Laterality: N/A;   CAROTID PTA/STENT INTERVENTION Left    HX: 2  stents   CATARACT EXTRACTION W/PHACO Left 03/24/2021   Procedure: CATARACT EXTRACTION PHACO AND INTRAOCULAR LENS PLACEMENT (IOC) LEFT DIABETIC 6.67 00:42.5;  Surgeon: Birder Robson, MD;  Location: [facility];  Service: Ophthalmology;  Laterality: Left;   COLONOSCOPY WITH PROPOFOL N/A 02/18/2019   Procedure: COLONOSCOPY WITH PROPOFOL;  Surgeon: Lucilla Lame, MD;  [facility];  Service: Endoscopy;  Laterality: N/A;   COLONOSCOPY WITH PROPOFOL N/A 12/22/2021   Procedure: COLONOSCOPY WITH PROPOFOL;  Surgeon: Lucilla Lame, MD;  Location: [facility];  Service: Endoscopy;  Laterality: N/A;   CORONARY ANGIOPLASTY WITH STENT PLACEMENT Left 2002   CORONARY ARTERY BYPASS GRAFT N/A 01/03/2016   Procedure: 3v CORONARY ARTERY BYPASS GRAFT (LIMA-LAD, SVG-PDA, SVG-OM2); Location: [facility]; Surgeon: Dwaine Deter, MD   CYSTOSCOPY W/ URETERAL STENT REMOVAL N/A 10/04/2021   Procedure: CYSTOSCOPY WITH PROSTATE FOREIGN BODY REMOVAL;  Surgeon: Abbie Sons, MD;  Location: [facility];  Service: Urology;  Laterality: N/A;   CYSTOSCOPY WITH INSERTION OF UROLIFT N/A 04/29/2019   Procedure: CYSTOSCOPY WITH INSERTION OF UROLIFT;  Surgeon: Abbie Sons, MD;  Location: [facility];  Service: Urology;  Laterality: N/A;   ESOPHAGOGASTRODUODENOSCOPY (EGD) WITH PROPOFOL N/A 09/11/2016   Procedure: ESOPHAGOGASTRODUODENOSCOPY (EGD) WITH PROPOFOL;  Surgeon: Lucilla Lame, MD;  Location: [facility];  Service: Endoscopy;  Laterality: N/A;   ESOPHAGOGASTRODUODENOSCOPY (EGD) WITH PROPOFOL N/A 10/17/2016   Procedure: ESOPHAGOGASTRODUODENOSCOPY (EGD) WITH PROPOFOL;  Surgeon: Lucilla Lame, MD;  Location: [facility];  Service: Endoscopy;  Laterality: N/A;   ESOPHAGOGASTRODUODENOSCOPY (EGD) WITH PROPOFOL N/A 11/13/2017   Procedure: ESOPHAGOGASTRODUODENOSCOPY (EGD) WITH PROPOFOL;  Surgeon: Lucilla Lame, MD;  Location: [facility];  Service: Endoscopy;  Laterality: N/A;   pins R lower leg Left    rotator cuff  replaced Right    SAVORY DILATION N/A 10/17/2016   Procedure: SAVORY DILATION;  Surgeon: Lucilla Lame, MD;  Location: [facility];  Service: Endoscopy;  Laterality: N/A;   TRANSURETHRAL INCISION OF PROSTATE N/A 10/04/2021   Procedure: TRANSURETHRAL INCISION OF THE PROSTATE (TUIP);  Surgeon: Abbie Sons, MD;  Location: [facility];  Service: Urology;  Laterality: N/A;    Family History  Problem Relation Age of Onset   Heart failure Father    Throat cancer Mother    Diabetes Brother     No Known Allergies     Latest Ref Rng & Units 06/06/2022    3:03 AM 07/01/2015    8:57 AM 09/20/2012    7:19 AM  CBC  WBC 4.0 - 10.5 K/uL 7.5  4.7  5.2   Hemoglobin 13.0 - 17.0 g/dL 13.2  14.5  14.8   Hematocrit 39.0 - 52.0 % 38.2  41.6  40.8   Platelets 150 - 400 K/uL 155  122  159       CMP     Component Value Date/Time   NA 141 06/06/2022 0303   NA 141 05/11/2012 1929   K 3.5 06/06/2022 0303   K 3.9 05/11/2012 1929   CL 111 06/06/2022 0303   CL 107 05/11/2012 1929   CO2 25 06/06/2022 0303   CO2 26 05/11/2012 1929   GLUCOSE 137 (H) 06/06/2022 0303   GLUCOSE 144 (H) 05/11/2012 1929   BUN 13 06/06/2022 0303   BUN 11 05/11/2012 1929   CREATININE 0.87 06/06/2022 0303   CREATININE 0.85 05/11/2012 1929   CALCIUM 8.4 (L) 06/06/2022 0303   CALCIUM 8.5 05/11/2012 1929   PROT 7.1 05/11/2012 1929   ALBUMIN 3.5 05/11/2012 1929   AST 23 05/11/2012 1929   ALT 35 05/11/2012 1929   ALKPHOS 110 05/11/2012 1929   BILITOT 0.3 05/11/2012 1929   GFRNONAA >60 06/06/2022 0303   GFRNONAA >60 05/11/2012 1929   GFRAA >60 07/01/2015 0857   GFRAA >60 05/11/2012 1929     No results found.     Assessment & Plan:   1. Bilateral carotid artery stenosis Recommend:  The patient is s/p successful left ICA stent placement  Duplex ultrasound preoperatively shows -39% contralateral stenosis.  Continue  antiplatelet therapy as prescribed Continue management of CAD, HTN and Hyperlipidemia Healthy  heart diet,  encouraged exercise at least 4 times per week  Follow up in 3 months with duplex ultrasound and physical exam based on the patient's carotid surgery   2. Primary hypertension Continue antihypertensive medications as already ordered, these medications have been reviewed and there are no changes at this time. Current Outpatient Medications on File Prior to Visit  Medication Sig Dispense Refill   albuterol (VENTOLIN HFA) 108 (90 Base) MCG/ACT inhaler SMARTSIG:2 inhalation Via Inhaler Every 4 Hours PRN     aspirin EC 81 MG tablet Take 1 tablet (81 mg total) by mouth daily at 6 (six) AM. Swallow whole. 30 tablet 12   atorvastatin (LIPITOR) 80 MG tablet Take 80 mg by mouth daily.      buPROPion (WELLBUTRIN XL) 150 MG 24 hr tablet Take 150 mg by mouth at bedtime.     cholecalciferol (VITAMIN D3) 25 MCG (1000 UT) tablet Take 1,000 Units by mouth every evening.     clonazePAM (KLONOPIN) 0.5 MG tablet Take 0.5 mg by mouth 2 (two) times daily.     clopidogrel (PLAVIX) 75 MG tablet Take 75 mg by mouth daily.   1   fluticasone-salmeterol (ADVAIR) 500-50 MCG/ACT AEPB Inhale into the lungs.     glucose blood test strip      levothyroxine (SYNTHROID, LEVOTHROID) 100 MCG tablet Take 100 mcg by mouth daily before breakfast.     metFORMIN (GLUCOPHAGE) 500 MG tablet Take 500 mg by mouth 2 (two) times daily.      metoprolol tartrate (LOPRESSOR) 25 MG tablet Take 25 mg by mouth 2 (two) times daily.     Multiple Vitamin (MULTIVITAMIN WITH MINERALS) TABS Take 1 tablet by mouth daily.     oxyCODONE-acetaminophen (PERCOCET/ROXICET) 5-325 MG tablet Take 1 tablet by mouth every 6 (six) hours as needed for moderate pain. 20 tablet 0   pantoprazole (PROTONIX) 40 MG tablet Take 1 tablet (40 mg total) by mouth at bedtime. 90 tablet 3   psyllium (METAMUCIL SMOOTH TEXTURE) 28 % packet Take 1 packet by mouth daily.     RA KRILL OIL 500 MG CAPS Take 500 mg by mouth at bedtime.      tamsulosin (FLOMAX) 0.4 MG CAPS  capsule TAKE 1 CAPSULE BY MOUTH EVERY DAY 90 capsule 1   venlafaxine XR (EFFEXOR-XR) 150 MG 24 hr capsule Take 150 mg by mouth daily.     Coenzyme Q10 (COQ10) 100 MG CAPS Take 100 mg by mouth at bedtime. (Patient not taking: Reported on 06/29/2022)     Ferrous Sulfate (IRON) 325 (65 Fe) MG TABS Take 325 mg by mouth daily. (Patient not taking: Reported on 06/05/2022)     naproxen sodium (ALEVE) 220 MG tablet Take 220-440 mg by mouth 2 (two) times daily as needed (pain.). (Patient not taking: Reported on 06/05/2022)     No current facility-administered medications on file prior to visit.    There are no Patient Instructions on file for this visit. No follow-ups on file.   Kris Hartmann, NP

## 2022-07-10 ENCOUNTER — Ambulatory Visit: Payer: Medicare HMO | Admitting: Speech Pathology

## 2022-07-11 ENCOUNTER — Ambulatory Visit: Payer: Medicare HMO | Admitting: Speech Pathology

## 2022-07-11 DIAGNOSIS — R4701 Aphasia: Secondary | ICD-10-CM | POA: Diagnosis not present

## 2022-07-11 DIAGNOSIS — I63512 Cerebral infarction due to unspecified occlusion or stenosis of left middle cerebral artery: Secondary | ICD-10-CM | POA: Diagnosis not present

## 2022-07-11 DIAGNOSIS — R482 Apraxia: Secondary | ICD-10-CM

## 2022-07-11 NOTE — Therapy (Unsigned)
OUTPATIENT SPEECH LANGUAGE PATHOLOGY TREATMENT  10TH VISIT PROGRESS NOTE  Patient Name: Paul Bass MRN: [id number] DOB:1945/10/02, 76 y.o., male Today's Date: 07/11/2022  PCP: [name], NP REFERRING PROVIDER: Jennings Books, MD   END OF SESSION:   End of Session - 07/11/22 1559     Visit Number 21    Number of Visits 32    Date for SLP Re-Evaluation 08/15/22    Authorization Type [employer] H[employer]    Authorization Time Period 03/30/2022 thru 08/21/2022    Authorization - Visit Number 21    Authorization - Number of Visits 43    Progress Note Due on Visit 56    SLP Start Time 1100    SLP Stop Time  1150    SLP Time Calculation (min) 50 min    Activity Tolerance Patient tolerated treatment well                  Past Medical History:  Diagnosis Date   Accelerated junctional rhythm    Anemia    Anxiety    a.) Tx'd with BZO PRN   Arthritis    Bilateral carotid artery disease ([name])    BPH (benign prostatic hypertrophy)    CAD S/P percutaneous coronary angioplasty    a.) PCI in 2002 placing a RCA stent (unknown type). b.) LHC 12/07/2015: 5% ISR m-dRCA, 90% dRCA, 25% pRCA, 25% p-mLCx, 100% OM3, 95% oOM2-OM2, 75% m-dLAD; refer to CVTS. c.) 3v CABG 01/03/2016   CHF (congestive heart failure) ([place])    a.) TTE 05/12/2012: EF 50-55%; mild LA enlargement; G1DD. b.)  TTE 11/26/2015: EF 45%; mild BAE; triv PR, mild MR/TR; inferolateral HK; G1DD. c.)  TTE 11/07/2017: EF 35%; RV enlargement; BAE; inferior and inferolateral HK; triv PR; mild MR/TR. d.)  TTE 09/17/2019: EF 35%; mild LVH; triv MR/TR. e.)  TTE 08/11/2021: EF 50%; triv MR/TR; G1DD.   Cognitive deficit following cerebrovascular accident (CVA)    COPD (chronic obstructive pulmonary disease) ([name])    Cortical cataract    Depression    Dyspnea    GERD (gastroesophageal reflux disease)    History of 2019 novel coronavirus disease (COVID-19) 01/19/2021   History of acute inferior wall MI 2002   a.) PCI was  performed placing a RCA stent (unknown type)   History of kidney stones    HTN (hypertension)    Hyperlipidemia    Hypothyroidism    Long term current use of antithrombotics/antiplatelets    a.) DAPT therapy (ASA + clopidogrel)   OSA (obstructive sleep apnea)    a.) does not require nocturnal PAP therapy   PLMD (periodic limb movement disorder)    Postoperative atrial fibrillation ([place]) 01/03/2016   a.) following CABG procedure   Prostatitis    PVD (peripheral vascular disease) ([place])    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 b/) residual RIGHT sided weakness   T2DM (type 2 diabetes mellitus) ([place])    Tobacco abuse    Past Surgical History:  Procedure Laterality Date   CARDIAC CATHETERIZATION N/A 12/07/2015   Procedure: Left Heart Cath and Coronary Angiography;  Surgeon: Corey Skains, MD;  Location: [facility];  Service: Cardiovascular;  Laterality: N/A;   CAROTID ANGIOGRAPHY N/A 06/05/2022  Procedure: CAROTID ANGIOGRAPHY;  Surgeon: Algernon Huxley, MD;  Location: [facility];  Service: Cardiovascular;  Laterality: N/A;   CAROTID PTA/STENT INTERVENTION Left    HX: 2 stents   CATARACT EXTRACTION W/PHACO Left 03/24/2021   Procedure: CATARACT EXTRACTION PHACO AND INTRAOCULAR LENS PLACEMENT (IOC) LEFT DIABETIC 6.67 00:42.5;  Surgeon: Birder Robson, MD;  Location: [facility];  Service: Ophthalmology;  Laterality: Left;   COLONOSCOPY WITH PROPOFOL N/A 02/18/2019   Procedure: COLONOSCOPY WITH PROPOFOL;  Surgeon: Lucilla Lame, MD;  Location: [facility];  Service: Endoscopy;  Laterality: N/A;   COLONOSCOPY WITH PROPOFOL N/A 12/22/2021   Procedure: COLONOSCOPY WITH PROPOFOL;  Surgeon: Lucilla Lame, MD;  Location: [facility];   Service: Endoscopy;  Laterality: N/A;   CORONARY ANGIOPLASTY WITH STENT PLACEMENT Left 2002   CORONARY ARTERY BYPASS GRAFT N/A 01/03/2016   Procedure: 3v CORONARY ARTERY BYPASS GRAFT (LIMA-LAD, SVG-PDA, SVG-OM2); Location: [facility]; Surgeon: Dwaine Deter, MD   CYSTOSCOPY W/ URETERAL STENT REMOVAL N/A 10/04/2021   Procedure: CYSTOSCOPY WITH PROSTATE FOREIGN BODY REMOVAL;  Surgeon: Abbie Sons, MD;  Location: [facility];  Service: Urology;  Laterality: N/A;   CYSTOSCOPY WITH INSERTION OF UROLIFT N/A 04/29/2019   Procedure: CYSTOSCOPY WITH INSERTION OF UROLIFT;  Surgeon: Abbie Sons, MD;  Location: [facility];  Service: Urology;  Laterality: N/A;   ESOPHAGOGASTRODUODENOSCOPY (EGD) WITH PROPOFOL N/A 09/11/2016   Procedure: ESOPHAGOGASTRODUODENOSCOPY (EGD) WITH PROPOFOL;  Surgeon: Lucilla Lame, MD;  Location: [facility];  Service: Endoscopy;  Laterality: N/A;   ESOPHAGOGASTRODUODENOSCOPY (EGD) WITH PROPOFOL N/A 10/17/2016   Procedure: ESOPHAGOGASTRODUODENOSCOPY (EGD) WITH PROPOFOL;  Surgeon: Lucilla Lame, MD;  Location: [facility];  Service: Endoscopy;  Laterality: N/A;   ESOPHAGOGASTRODUODENOSCOPY (EGD) WITH PROPOFOL N/A 11/13/2017   Procedure: ESOPHAGOGASTRODUODENOSCOPY (EGD) WITH PROPOFOL;  Surgeon: Lucilla Lame, MD;  Location: [facility];  Service: Endoscopy;  Laterality: N/A;   pins R lower leg Left    rotator cuff replaced Right    SAVORY DILATION N/A 10/17/2016   Procedure: SAVORY DILATION;  Surgeon: Lucilla Lame, MD;  Location: [facility];  Service: Endoscopy;  Laterality: N/A;   TRANSURETHRAL INCISION OF PROSTATE N/A 10/04/2021   Procedure: TRANSURETHRAL INCISION OF THE PROSTATE (TUIP);  Surgeon: Abbie Sons, MD;  Location: [facility];  Service: Urology;  Laterality: N/A;   Patient Active Problem List   Diagnosis Date Noted   Carotid stenosis, symptomatic, with infarction ([place]) 06/05/2022   History of colonic polyps    Anxiety state 11/17/2021   Cognitive deficit,  post-stroke 11/17/2021   Ex-smoker 11/17/2021   Bradycardia 46/96/2952   Chronic systolic CHF (congestive heart failure), NYHA class 2 ([place]) 05/27/2020   Accelerated junctional rhythm 04/21/2019   Encounter for screening colonoscopy    Polyp of colon    Atherosclerotic heart disease of native coronary artery without angina pectoris 01/14/2019   Depression 01/14/2019   GERD (gastroesophageal reflux disease) 01/14/2019   Periodic limb movement disorder (PLMD) 01/14/2019   Sleep apnea 01/14/2019   Type 2 diabetes mellitus without complications ([place]) 84/13/2440   Stricture and stenosis of esophagus    Problems with swallowing and mastication    Food impaction of esophagus    H/O coronary artery bypass surgery 01/17/2016   Cerebrovascular accident (CVA) due to embolism ([place]) 01/03/2016   Hypothyroidism, unspecified 01/03/2016   HLD (hyperlipidemia) 01/03/2016   Stable angina 11/29/2015   Benign essential hypertension 05/27/2015   Bilateral carotid artery stenosis 02/24/2015   Atherosclerotic peripheral vascular disease ([place]) 09/07/2014   Bladder calculus 08/12/2014  Primary osteoarthritis of left knee 03/10/2014   Elevated prostate specific antigen (PSA) 02/05/2013   Snoring 01/22/2013   Sleepiness 01/22/2013   Dysphagia, unspecified(787.20) 01/22/2013   Occlusion and stenosis of carotid artery without mention of cerebral infarction 01/22/2013   Cerebral thrombosis with cerebral infarction ([place]) 01/22/2013   Benign prostatic hyperplasia with incomplete bladder emptying 08/26/2012   Chronic prostatitis 08/26/2012   Family history of malignant neoplasm of prostate 08/26/2012   Incomplete emptying of bladder 08/26/2012   Dysphagia 05/14/2012   Global aphasia 05/14/2012   Stroke, acute, thrombotic ([place]) 05/12/2012   Symptomatic carotid artery stenosis 05/12/2012   HTN (hypertension) 05/12/2012   COPD (chronic obstructive pulmonary disease) ([place]) 05/12/2012   Acute respiratory  failure ([place]) 05/12/2012    ONSET DATE: 05/13/2012  REFERRING DIAG: D40.814 (ICD-10-CM) - Cognitive deficit, post-stroke   PERTINENT HISTORY: Pt is a 76 year old male with past medical hx of anxiety, COP, depression, diabetes mellitus without complication, HTN, hyperlipidemia.    Of note, pt received Outpatient ST services for a total of 28 sessions ending on 03/24/2020. At the end of that POC pt required moderate assistance.      DIAGNOSTIC FINDINGS:  MRI 10/03/2019  No acute intracranial abnormality. 2. Moderate-sized chronic left MCA infarct. 3. Mild chronic small vessel ischemic disease, mildly progressed from 2014. 4. Progressive cerebral atrophy.  THERAPY DIAG:  Aphasia  Apraxia  Left middle cerebral artery stroke [facility])  Rationale for Evaluation and Treatment Rehabilitation  SUBJECTIVE: Pt reports that he is NOT taking the Touch [employer] with him to restaurants  Pt accompanied by: self  PAIN:  Are you having pain? No  PATIENT GOALS: he would like to be able to communicate better   OBJECTIVE:   TODAY'S TREATMENT: Skilled treatment session targeted pt's word finding and communication breakdown goals. SLP facilitated session by providing the following interventions:  she laughed at some of the jokes educated on seeing the words of the icons help promote language use text to his wife  Pt independently selected all requested icons/information with 100%.   use device to supplement his own language/word finding  PATIENT EDUCATION: Education details: note sent to pt's wife asking for a list of situations that pt has difficulty communicating in Person educated: Patient and wife (via text) Education method: Explanation and Handouts Education comprehension: verbalized understanding  HOME EXERCISE PROGRAM:   Note was written to his wife request they eat at one of the restaurants on his SGD to promote ability with device   GOALS: Goals reviewed with patient? Yes    SHORT TERM GOALS: Target date: 10 sessions  03/30/2022 - 05/16/2022 Pt will learn strategies (such as drawing, gestures) to aid during communication breakdowns.  Baseline: only using circumlocutions 05/16/2022 - Goal status: DISCONTINUED, pt unable d/t motor deficits   2.  With minimal assistance, pt will use strategies to help during communication breakdowns.  Baseline: new goal 05/16/2022 Goal status: DISCONTINUED, no viable strategies identified as successful  05/16/2022  3.  Pt will respond to general questions using AAC/nonverbal communication effectively with 50% accuracy and mod cues.  Baseline: 25% Goal Status: INITIAL 07/07/2022 Goal Status: MET Updated Goal: Pt will respond to general questions using AAC/nonverbal communication effectively with 75% and Min cues.   4.  Pt will use AAC to communicate within group situations with 50% accuracy given Mod to Min cues.   Baseline: 15% Goal Status: INITIAL 07/07/2022 Goal Status: MET Updated Goal: Pt will use AAC to communication within group situations  with 75% accuracy given Min A cues.   5.   Pt will use AAC to communicate in a structured conversation setting with 75% accuracy and min cues.  Baseline: 25% Goal Status: MET 07/07/2022 Goal Status: Pt will use AAC to communicate in a structured conversation setting with 80% accuracy and intermittent cues.      LONG TERM GOALS: Target date: 08/15/2022   03/30/2022 thru 05/16/2022 Pt will utilize strategies to aid in communication breakdowns with Mod I.  Baseline: only using circumlocutions 05/16/2022 Goal status: DISCONTINUED, no viable strategies identified as successful  05/16/2022 2.  To maximize functional communication across all communication contexts using AAC and multimodal means of communication. Baseline: 25% Goal status: ONGOING      ASSESSMENT:  CLINICAL IMPRESSION: When discussing use of SGD, pt's moderate expressive aphasia prevent shim from  effectively communicating when he is using the device. Reached out to pt's wife via text for a date for her to attend session to promote use of device outside of session. Pt does state "when people are walking around (the restaurant), I am thinking in my head what I want but then I draw a blank."   OBJECTIVE IMPAIRMENTS include expressive language and aphasia. These impairments are limiting patient from effectively communicating at home and in community. Factors affecting potential to achieve goals and functional outcome are  time post onset (10 years) and previous ST intervention . Patient will benefit from skilled SLP services to address above impairments and improve overall function.  REHAB POTENTIAL: Fair time post onset (10 years)  PLAN: SLP FREQUENCY: 1-2x/week  SLP DURATION: 8 weeks  PLANNED INTERVENTIONS: Language facilitation, Internal/external aids, SLP instruction and feedback, Compensatory strategies, and Patient/family education   Vola Beneke B. Rutherford Nail, M.S., CCC-SLP, Mining engineer Certified Brain Injury [place]  [place] Office [phone] Ascom [phone] Fax [phone]

## 2022-07-11 NOTE — Therapy (Signed)
OUTPATIENT SPEECH LANGUAGE PATHOLOGY TREATMENT  10TH VISIT PROGRESS NOTE  Patient Name: Paul Bass MRN: [id number] DOB:12/05/1945, 76 y.o., male Today's Date: 07/11/2022  PCP: [name], NP REFERRING PROVIDER: Jennings Books, MD  Speech Therapy Progress Note  Dates of Reporting Period: 05/16/2022 to 07/07/2022  Objective: Patient has been seen for 10 speech therapy sessions this reporting period targeting use of multimodal communication specifically a speech generating device. Patient is making progress toward LTGs and met 3 STGs this reporting period. See skilled intervention, clinical impressions, and goals below for details.   END OF SESSION:   End of Session - 07/11/22 1423     Visit Number 20    Number of Visits 23    Date for SLP Re-Evaluation 08/15/22    Authorization Type [employer] HMO    Authorization Time Period 03/30/2022 thru 08/21/2022    Authorization - Visit Number [id number]    Authorization - Number of Visits 43    Progress Note Due on Visit 20    SLP Start Time 1100    SLP Stop Time  1200    SLP Time Calculation (min) 60 min    Activity Tolerance Patient tolerated treatment well                  Past Medical History:  Diagnosis Date   Accelerated junctional rhythm    Anemia    Anxiety    a.) Tx'd with BZO PRN   Arthritis    Bilateral carotid artery disease ([name])    BPH (benign prostatic hypertrophy)    CAD S/P percutaneous coronary angioplasty    a.) PCI in 2002 placing a RCA stent (unknown type). b.) LHC 12/07/2015: 5% ISR m-dRCA, 90% dRCA, 25% pRCA, 25% p-mLCx, 100% OM3, 95% oOM2-OM2, 75% m-dLAD; refer to CVTS. c.) 3v CABG 01/03/2016   CHF (congestive heart failure) ([place])    a.) TTE 05/12/2012: EF 50-55%; mild LA enlargement; G1DD. b.)  TTE 11/26/2015: EF 45%; mild BAE; triv PR, mild MR/TR; inferolateral HK; G1DD. c.)  TTE 11/07/2017: EF 35%; RV enlargement; BAE; inferior and inferolateral HK; triv PR; mild MR/TR. d.)  TTE  09/17/2019: EF 35%; mild LVH; triv MR/TR. e.)  TTE 08/11/2021: EF 50%; triv MR/TR; G1DD.   Cognitive deficit following cerebrovascular accident (CVA)    COPD (chronic obstructive pulmonary disease) ([name])    Cortical cataract    Depression    Dyspnea    GERD (gastroesophageal reflux disease)    History of 2019 novel coronavirus disease (COVID-19) 01/19/2021   History of acute inferior wall MI 2002   a.) PCI was performed placing a RCA stent (unknown type)   History of kidney stones    HTN (hypertension)    Hyperlipidemia    Hypothyroidism    Long term current use of antithrombotics/antiplatelets    a.) DAPT therapy (ASA + clopidogrel)   OSA (obstructive sleep apnea)    a.) does not require nocturnal PAP therapy   PLMD (periodic limb movement disorder)    Postoperative atrial fibrillation ([place]) 01/03/2016   a.) following CABG procedure   Prostatitis    PVD (peripheral vascular disease) ([place])    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 b/) residual RIGHT sided weakness   T2DM (type  2 diabetes mellitus) ([place])    Tobacco abuse    Past Surgical History:  Procedure Laterality Date   CARDIAC CATHETERIZATION N/A 12/07/2015   Procedure: Left Heart Cath and Coronary Angiography;  Surgeon: Corey Skains, MD;  Location: [facility];  Service: Cardiovascular;  Laterality: N/A;   CAROTID ANGIOGRAPHY N/A 06/05/2022   Procedure: CAROTID ANGIOGRAPHY;  Surgeon: Algernon Huxley, MD;  Location: [facility];  Service: Cardiovascular;  Laterality: N/A;   CAROTID PTA/STENT INTERVENTION Left    HX: 2 stents   CATARACT EXTRACTION W/PHACO Left 03/24/2021   Procedure: CATARACT EXTRACTION PHACO AND INTRAOCULAR LENS PLACEMENT (IOC) LEFT DIABETIC 6.67  00:42.5;  Surgeon: Birder Robson, MD;  Location: [facility];  Service: Ophthalmology;  Laterality: Left;   COLONOSCOPY WITH PROPOFOL N/A 02/18/2019   Procedure: COLONOSCOPY WITH PROPOFOL;  Surgeon: Lucilla Lame, MD;  Location[facility];  Service: Endoscopy;  Laterality: N/A;   COLONOSCOPY WITH PROPOFOL N/A 12/22/2021   Procedure: COLONOSCOPY WITH PROPOFOL;  Surgeon: Lucilla Lame, MD;  Location: [facility];  Service: Endoscopy;  Laterality: N/A;   CORONARY ANGIOPLASTY WITH STENT PLACEMENT Left 2002   CORONARY ARTERY BYPASS GRAFT N/A 01/03/2016   Procedure: 3v CORONARY ARTERY BYPASS GRAFT (LIMA-LAD, SVG-PDA, SVG-OM2); Location: [facility]; Surgeon: Dwaine Deter, MD   CYSTOSCOPY W/ URETERAL STENT REMOVAL N/A 10/04/2021   Procedure: CYSTOSCOPY WITH PROSTATE FOREIGN BODY REMOVAL;  Surgeon: Abbie Sons, MD;  Location: [facility];  Service: Urology;  Laterality: N/A;   CYSTOSCOPY WITH INSERTION OF UROLIFT N/A 04/29/2019   Procedure: CYSTOSCOPY WITH INSERTION OF UROLIFT;  Surgeon: Abbie Sons, MD;  Location: [facility];  Service: Urology;  Laterality: N/A;   ESOPHAGOGASTRODUODENOSCOPY (EGD) WITH PROPOFOL N/A 09/11/2016   Procedure: ESOPHAGOGASTRODUODENOSCOPY (EGD) WITH PROPOFOL;  Surgeon: Lucilla Lame, MD;  Location: [facility];  Service: Endoscopy;  Laterality: N/A;   ESOPHAGOGASTRODUODENOSCOPY (EGD) WITH PROPOFOL N/A 10/17/2016   Procedure: ESOPHAGOGASTRODUODENOSCOPY (EGD) WITH PROPOFOL;  Surgeon: Lucilla Lame, MD;  Location: [facility];  Service: Endoscopy;  Laterality: N/A;   ESOPHAGOGASTRODUODENOSCOPY (EGD) WITH PROPOFOL N/A 11/13/2017   Procedure: ESOPHAGOGASTRODUODENOSCOPY (EGD) WITH PROPOFOL;  Surgeon: Lucilla Lame, MD;  Location: [facility];  Service: Endoscopy;  Laterality: N/A;   pins R lower leg Left    rotator cuff replaced Right    SAVORY DILATION N/A 10/17/2016   Procedure: SAVORY DILATION;  Surgeon: Lucilla Lame, MD;  Location: [facility];  Service: Endoscopy;   Laterality: N/A;   TRANSURETHRAL INCISION OF PROSTATE N/A 10/04/2021   Procedure: TRANSURETHRAL INCISION OF THE PROSTATE (TUIP);  Surgeon: Abbie Sons, MD;  Location: [facility];  Service: Urology;  Laterality: N/A;   Patient Active Problem List   Diagnosis Date Noted   Carotid stenosis, symptomatic, with infarction ([place]) 06/05/2022   History of colonic polyps    Anxiety state 11/17/2021   Cognitive deficit, post-stroke 11/17/2021   Ex-smoker 11/17/2021   Bradycardia 48/27/0786   Chronic systolic CHF (congestive heart failure), NYHA class 2 ([place]) 05/27/2020   Accelerated junctional rhythm 04/21/2019   Encounter for screening colonoscopy    Polyp of colon    Atherosclerotic heart disease of native coronary artery without angina pectoris 01/14/2019   Depression 01/14/2019   GERD (gastroesophageal reflux disease) 01/14/2019   Periodic limb movement disorder (PLMD) 01/14/2019   Sleep apnea 01/14/2019   Type 2 diabetes mellitus without complications ([place]) 75/44/9201   Stricture and stenosis of esophagus    Problems with swallowing and mastication    Food impaction of esophagus  H/O coronary artery bypass surgery 01/17/2016   Cerebrovascular accident (CVA) due to embolism ([place]) 01/03/2016   Hypothyroidism, unspecified 01/03/2016   HLD (hyperlipidemia) 01/03/2016   Stable angina 11/29/2015   Benign essential hypertension 05/27/2015   Bilateral carotid artery stenosis 02/24/2015   Atherosclerotic peripheral vascular disease ([place]) 09/07/2014   Bladder calculus 08/12/2014   Primary osteoarthritis of left knee 03/10/2014   Elevated prostate specific antigen (PSA) 02/05/2013   Snoring 01/22/2013   Sleepiness 01/22/2013   Dysphagia, unspecified(787.20) 01/22/2013   Occlusion and stenosis of carotid artery without mention of cerebral infarction 01/22/2013   Cerebral thrombosis with cerebral infarction ([place]) 01/22/2013   Benign prostatic hyperplasia with incomplete bladder emptying  08/26/2012   Chronic prostatitis 08/26/2012   Family history of malignant neoplasm of prostate 08/26/2012   Incomplete emptying of bladder 08/26/2012   Dysphagia 05/14/2012   Global aphasia 05/14/2012   Stroke, acute, thrombotic ([place]) 05/12/2012   Symptomatic carotid artery stenosis 05/12/2012   HTN (hypertension) 05/12/2012   COPD (chronic obstructive pulmonary disease) ([place]) 05/12/2012   Acute respiratory failure ([place]) 05/12/2012    ONSET DATE: 05/13/2012  REFERRING DIAG: Z66.063 (ICD-10-CM) - Cognitive deficit, post-stroke   PERTINENT HISTORY: Pt is a 76 year old male with past medical hx of anxiety, COP, depression, diabetes mellitus without complication, HTN, hyperlipidemia.    Of note, pt received Outpatient ST services for a total of 28 sessions ending on 03/24/2020. At the end of that POC pt required moderate assistance.      DIAGNOSTIC FINDINGS:  MRI 10/03/2019  No acute intracranial abnormality. 2. Moderate-sized chronic left MCA infarct. 3. Mild chronic small vessel ischemic disease, mildly progressed from 2014. 4. Progressive cerebral atrophy.  THERAPY DIAG:  Aphasia  Apraxia  Left middle cerebral artery stroke [facility])  Rationale for Evaluation and Treatment Rehabilitation  SUBJECTIVE: Pt reports that he is NOT taking the Touch [employer] with him to restaurants  Pt accompanied by: self  PAIN:  Are you having pain? No  PATIENT GOALS: he would like to be able to communicate better   OBJECTIVE:   TODAY'S TREATMENT: Skilled treatment session targeted pt's word finding and communication breakdown goals. SLP facilitated session by providing the following interventions:  Pt's wife sent information regarding situations that pt has difficulty commmunicating in - social unexpected interactions, when people ask too many questions at one time and wife reports pt used to enjoy joking but he has gotten more reserved and quiet in the last 10 years following his  stroke  SLP facilitiated increased conversation initiation by adding conservational questions about his wife's day as well as added some seasonal jokes.      PATIENT EDUCATION: Education details: note sent to pt's wife asking for a list of situations that pt has difficulty communicating in Person educated: Patient and wife (via text) Education method: Explanation and Handouts Education comprehension: verbalized understanding  HOME EXERCISE PROGRAM:   Note was written to his wife request they eat at one of the restaurants on his SGD to promote ability with device   GOALS: Goals reviewed with patient? Yes   SHORT TERM GOALS: Target date: 10 sessions  03/30/2022 - 05/16/2022 Pt will learn strategies (such as drawing, gestures) to aid during communication breakdowns.  Baseline: only using circumlocutions 05/16/2022 - Goal status: DISCONTINUED, pt unable d/t motor deficits   2.  With minimal assistance, pt will use strategies to help during communication breakdowns.  Baseline: new goal 05/16/2022 Goal status: DISCONTINUED, no viable strategies identified as successful  05/16/2022  3.  Pt will respond to general questions using AAC/nonverbal communication effectively with 50% accuracy and mod cues.  Baseline: 25% Goal Status: INITIAL 07/07/2022 Goal Status: MET Updated Goal: Pt will respond to general questions using AAC/nonverbal communication effectively with 75% and Min cues.   4.  Pt will use AAC to communicate within group situations with 50% accuracy given Mod to Min cues.   Baseline: 15% Goal Status: INITIAL 07/07/2022 Goal Status: MET Updated Goal: Pt will use AAC to communication within group situations with 75% accuracy given Min A cues.   5.   Pt will use AAC to communicate in a structured conversation setting with 75% accuracy and min cues.  Baseline: 25% Goal Status: MET 07/07/2022 Goal Status: Pt will use AAC to communicate in a structured conversation setting  with 80% accuracy and intermittent cues.      LONG TERM GOALS: Target date: 08/15/2022   03/30/2022 thru 05/16/2022 Pt will utilize strategies to aid in communication breakdowns with Mod I.  Baseline: only using circumlocutions 05/16/2022 Goal status: DISCONTINUED, no viable strategies identified as successful  05/16/2022 2.  To maximize functional communication across all communication contexts using AAC and multimodal means of communication. Baseline: 25% Goal status: ONGOING      ASSESSMENT:  CLINICAL IMPRESSION: When discussing use of SGD, pt's moderate expressive aphasia prevent shim from effectively communicating when he is using the device. Reached out to pt's wife via text for a date for her to attend session to promote use of device outside of session. Pt does state "when people are walking around (the restaurant), I am thinking in my head what I want but then I draw a blank."   OBJECTIVE IMPAIRMENTS include expressive language and aphasia. These impairments are limiting patient from effectively communicating at home and in community. Factors affecting potential to achieve goals and functional outcome are  time post onset (10 years) and previous ST intervention . Patient will benefit from skilled SLP services to address above impairments and improve overall function.  REHAB POTENTIAL: Fair time post onset (10 years)  PLAN: SLP FREQUENCY: 1-2x/week  SLP DURATION: 8 weeks  PLANNED INTERVENTIONS: Language facilitation, Internal/external aids, SLP instruction and feedback, Compensatory strategies, and Patient/family education   Starnisha Batrez B. Rutherford Nail, M.S., CCC-SLP, Mining engineer Certified Brain Injury [place]  [place] Office [phone] Ascom [phone] Fax [phone]

## 2022-07-12 ENCOUNTER — Ambulatory Visit: Payer: Medicare HMO | Admitting: Speech Pathology

## 2022-07-13 ENCOUNTER — Ambulatory Visit: Payer: Medicare HMO | Admitting: Speech Pathology

## 2022-07-13 DIAGNOSIS — R4701 Aphasia: Secondary | ICD-10-CM | POA: Diagnosis not present

## 2022-07-13 DIAGNOSIS — I63512 Cerebral infarction due to unspecified occlusion or stenosis of left middle cerebral artery: Secondary | ICD-10-CM | POA: Diagnosis not present

## 2022-07-13 DIAGNOSIS — R482 Apraxia: Secondary | ICD-10-CM | POA: Diagnosis not present

## 2022-07-17 ENCOUNTER — Ambulatory Visit: Payer: Medicare HMO | Admitting: Speech Pathology

## 2022-07-18 ENCOUNTER — Ambulatory Visit: Payer: Medicare HMO | Admitting: Speech Pathology

## 2022-07-18 DIAGNOSIS — R4701 Aphasia: Secondary | ICD-10-CM | POA: Diagnosis not present

## 2022-07-18 DIAGNOSIS — I63512 Cerebral infarction due to unspecified occlusion or stenosis of left middle cerebral artery: Secondary | ICD-10-CM

## 2022-07-18 DIAGNOSIS — R482 Apraxia: Secondary | ICD-10-CM | POA: Diagnosis not present

## 2022-07-18 NOTE — Therapy (Signed)
OUTPATIENT SPEECH LANGUAGE PATHOLOGY TREATMENT   Patient Name: Paul Bass MRN: [id number] DOB:1946/02/17, 76 y.o., male Today's Date: 07/14/2022  PCP: [name], NP REFERRING PROVIDER: Jennings Books, MD   END OF SESSION:   End of Session - 07/18/22 1551     Visit Number 22    Number of Visits 72    Date for SLP Re-Evaluation 08/15/22    Authorization Type [employer]    Authorization Time Period 03/30/2022 thru 08/21/2022    Authorization - Visit Number 53    Authorization - Number of Visits 43    Progress Note Due on Visit 24    SLP Start Time 1015    SLP Stop Time  1100    SLP Time Calculation (min) 45 min    Activity Tolerance Patient tolerated treatment well                  Past Medical History:  Diagnosis Date   Accelerated junctional rhythm    Anemia    Anxiety    a.) Tx'd with BZO PRN   Arthritis    Bilateral carotid artery disease ([name])    BPH (benign prostatic hypertrophy)    CAD S/P percutaneous coronary angioplasty    a.) PCI in 2002 placing a RCA stent (unknown type). b.) LHC 12/07/2015: 5% ISR m-dRCA, 90% dRCA, 25% pRCA, 25% p-mLCx, 100% OM3, 95% oOM2-OM2, 75% m-dLAD; refer to CVTS. c.) 3v CABG 01/03/2016   CHF (congestive heart failure) ([place])    a.) TTE 05/12/2012: EF 50-55%; mild LA enlargement; G1DD. b.)  TTE 11/26/2015: EF 45%; mild BAE; triv PR, mild MR/TR; inferolateral HK; G1DD. c.)  TTE 11/07/2017: EF 35%; RV enlargement; BAE; inferior and inferolateral HK; triv PR; mild MR/TR. d.)  TTE 09/17/2019: EF 35%; mild LVH; triv MR/TR. e.)  TTE 08/11/2021: EF 50%; triv MR/TR; G1DD.   Cognitive deficit following cerebrovascular accident (CVA)    COPD (chronic obstructive pulmonary disease) ([name])    Cortical cataract    Depression    Dyspnea    GERD (gastroesophageal reflux disease)    History of 2019 novel coronavirus disease (COVID-19) 01/19/2021   History of acute inferior wall MI 2002   a.) PCI was performed placing a RCA  stent (unknown type)   History of kidney stones    HTN (hypertension)    Hyperlipidemia    Hypothyroidism    Long term current use of antithrombotics/antiplatelets    a.) DAPT therapy (ASA + clopidogrel)   OSA (obstructive sleep apnea)    a.) does not require nocturnal PAP therapy   PLMD (periodic limb movement disorder)    Postoperative atrial fibrillation ([place]) 01/03/2016   a.) following CABG procedure   Prostatitis    PVD (peripheral vascular disease) ([place])    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 b/) residual RIGHT sided weakness   T2DM (type 2 diabetes mellitus) ([place])    Tobacco abuse    Past Surgical History:  Procedure Laterality Date   CARDIAC CATHETERIZATION N/A 12/07/2015   Procedure: Left Heart Cath and Coronary Angiography;  Surgeon: Corey Skains, MD;  Location: [facility];  Service: Cardiovascular;  Laterality: N/A;   CAROTID ANGIOGRAPHY N/A 06/05/2022   Procedure: CAROTID ANGIOGRAPHY;  Surgeon: Algernon Huxley, MD;  Location: [facility];  Service: Cardiovascular;  Laterality: N/A;   CAROTID PTA/STENT INTERVENTION Left    HX: 2 stents   CATARACT EXTRACTION W/PHACO Left 03/24/2021   Procedure: CATARACT EXTRACTION PHACO AND INTRAOCULAR LENS PLACEMENT (IOC) LEFT DIABETIC 6.67 00:42.5;  Surgeon: Birder Robson, MD;  Location: [facility];  Service: Ophthalmology;  Laterality: Left;   COLONOSCOPY WITH PROPOFOL N/A 02/18/2019   Procedure: COLONOSCOPY WITH PROPOFOL;  Surgeon: Lucilla Lame, MD;  Location: [facility];  Service: Endoscopy;  Laterality: N/A;   COLONOSCOPY WITH PROPOFOL N/A 12/22/2021   Procedure: COLONOSCOPY WITH PROPOFOL;  Surgeon: Lucilla Lame, MD;  Location: [facility];  Service: Endoscopy;   Laterality: N/A;   CORONARY ANGIOPLASTY WITH STENT PLACEMENT Left 2002   CORONARY ARTERY BYPASS GRAFT N/A 01/03/2016   Procedure: 3v CORONARY ARTERY BYPASS GRAFT (LIMA-LAD, SVG-PDA, SVG-OM2); Location: [facility]; Surgeon: Dwaine Deter, MD   CYSTOSCOPY W/ URETERAL STENT REMOVAL N/A 10/04/2021   Procedure: CYSTOSCOPY WITH PROSTATE FOREIGN BODY REMOVAL;  Surgeon: Abbie Sons, MD;  Location: [facility];  Service: Urology;  Laterality: N/A;   CYSTOSCOPY WITH INSERTION OF UROLIFT N/A 04/29/2019   Procedure: CYSTOSCOPY WITH INSERTION OF UROLIFT;  Surgeon: Abbie Sons, MD;  Location: [facility];  Service: Urology;  Laterality: N/A;   ESOPHAGOGASTRODUODENOSCOPY (EGD) WITH PROPOFOL N/A 09/11/2016   Procedure: ESOPHAGOGASTRODUODENOSCOPY (EGD) WITH PROPOFOL;  Surgeon: Lucilla Lame, MD;  Location: [facility];  Service: Endoscopy;  Laterality: N/A;   ESOPHAGOGASTRODUODENOSCOPY (EGD) WITH PROPOFOL N/A 10/17/2016   Procedure: ESOPHAGOGASTRODUODENOSCOPY (EGD) WITH PROPOFOL;  Surgeon: Lucilla Lame, MD;  Location: [facility];  Service: Endoscopy;  Laterality: N/A;   ESOPHAGOGASTRODUODENOSCOPY (EGD) WITH PROPOFOL N/A 11/13/2017   Procedure: ESOPHAGOGASTRODUODENOSCOPY (EGD) WITH PROPOFOL;  Surgeon: Lucilla Lame, MD;  Location: [facility];  Service: Endoscopy;  Laterality: N/A;   pins R lower leg Left    rotator cuff replaced Right    SAVORY DILATION N/A 10/17/2016   Procedure: SAVORY DILATION;  Surgeon: Lucilla Lame, MD;  Location: [facility];  Service: Endoscopy;  Laterality: N/A;   TRANSURETHRAL INCISION OF PROSTATE N/A 10/04/2021   Procedure: TRANSURETHRAL INCISION OF THE PROSTATE (TUIP);  Surgeon: Abbie Sons, MD;  Location: [facility];  Service: Urology;  Laterality: N/A;   Patient Active Problem List   Diagnosis Date Noted   Carotid stenosis, symptomatic, with infarction ([place]) 06/05/2022   History of colonic polyps    Anxiety state 11/17/2021   Cognitive deficit, post-stroke 11/17/2021    Ex-smoker 11/17/2021   Bradycardia 29/52/8413   Chronic systolic CHF (congestive heart failure), NYHA class 2 ([place]) 05/27/2020   Accelerated junctional rhythm 04/21/2019   Encounter for screening colonoscopy    Polyp of colon    Atherosclerotic heart disease of native coronary artery without angina pectoris 01/14/2019   Depression 01/14/2019   GERD (gastroesophageal reflux disease) 01/14/2019   Periodic limb movement disorder (PLMD) 01/14/2019   Sleep apnea 01/14/2019   Type 2 diabetes mellitus without complications ([place]) 24/40/1027   Stricture and stenosis of esophagus    Problems with swallowing and mastication    Food impaction of esophagus    H/O coronary artery bypass surgery 01/17/2016   Cerebrovascular accident (CVA) due to embolism ([place]) 01/03/2016   Hypothyroidism, unspecified 01/03/2016   HLD (hyperlipidemia) 01/03/2016   Stable angina 11/29/2015   Benign essential hypertension 05/27/2015   Bilateral carotid artery stenosis 02/24/2015   Atherosclerotic peripheral vascular disease ([place]) 09/07/2014   Bladder calculus 08/12/2014   Primary osteoarthritis  of left knee 03/10/2014   Elevated prostate specific antigen (PSA) 02/05/2013   Snoring 01/22/2013   Sleepiness 01/22/2013   Dysphagia, unspecified(787.20) 01/22/2013   Occlusion and stenosis of carotid artery without mention of cerebral infarction 01/22/2013   Cerebral thrombosis with cerebral infarction ([place]) 01/22/2013   Benign prostatic hyperplasia with incomplete bladder emptying 08/26/2012   Chronic prostatitis 08/26/2012   Family history of malignant neoplasm of prostate 08/26/2012   Incomplete emptying of bladder 08/26/2012   Dysphagia 05/14/2012   Global aphasia 05/14/2012   Stroke, acute, thrombotic ([place]) 05/12/2012   Symptomatic carotid artery stenosis 05/12/2012   HTN (hypertension) 05/12/2012   COPD (chronic obstructive pulmonary disease) ([place]) 05/12/2012   Acute respiratory failure ([place]) 05/12/2012     ONSET DATE: 05/13/2012  REFERRING DIAG: U98.119 (ICD-10-CM) - Cognitive deficit, post-stroke   PERTINENT HISTORY: Pt is a 76 year old male with past medical hx of anxiety, COP, depression, diabetes mellitus without complication, HTN, hyperlipidemia.    Of note, pt received Outpatient ST services for a total of 28 sessions ending on 03/24/2020. At the end of that POC pt required moderate assistance.      DIAGNOSTIC FINDINGS:  MRI 10/03/2019  No acute intracranial abnormality. 2. Moderate-sized chronic left MCA infarct. 3. Mild chronic small vessel ischemic disease, mildly progressed from 2014. 4. Progressive cerebral atrophy.  THERAPY DIAG:  Aphasia  Left middle cerebral artery stroke [facility])  Rationale for Evaluation and Treatment Rehabilitation  SUBJECTIVE: Pt reports that he is NOT taking the Touch [employer] with him to restaurants  Pt accompanied by: self  PAIN:  Are you having pain? No  PATIENT GOALS: he would like to be able to communicate better   OBJECTIVE:   TODAY'S TREATMENT: Skilled treatment session targeted pt's word finding and communication breakdown goals. SLP facilitated session by providing the following interventions:  Pt requested that we look thru the [employer] (what is that? Pointing to the [employer])  SLP provided skilled information on the categories and sub-categories, pt insightful into which icons/communication might be helpful - these icons were moved to pt's home page  PATIENT EDUCATION: Education details: note sent to pt's wife asking for a list of situations that pt has difficulty communicating in Person educated: Patient and wife (via text) Education method: Explanation and Handouts Education comprehension: verbalized understanding  HOME EXERCISE PROGRAM:   Note was written to his wife request they eat at one of the restaurants on his SGD to promote ability with device   GOALS: Goals reviewed with patient? Yes   SHORT TERM  GOALS: Target date: 10 sessions  03/30/2022 - 05/16/2022 Pt will learn strategies (such as drawing, gestures) to aid during communication breakdowns.  Baseline: only using circumlocutions 05/16/2022 - Goal status: DISCONTINUED, pt unable d/t motor deficits   2.  With minimal assistance, pt will use strategies to help during communication breakdowns.  Baseline: new goal 05/16/2022 Goal status: DISCONTINUED, no viable strategies identified as successful  05/16/2022  3.  Pt will respond to general questions using AAC/nonverbal communication effectively with 50% accuracy and mod cues.  Baseline: 25% Goal Status: INITIAL 07/07/2022 Goal Status: MET Updated Goal: Pt will respond to general questions using AAC/nonverbal communication effectively with 75% and Min cues.   4.  Pt will use AAC to communicate within group situations with 50% accuracy given Mod to Min cues.   Baseline: 15% Goal Status: INITIAL 07/07/2022 Goal Status: MET Updated Goal: Pt will use AAC to communication within group situations with 75%  accuracy given Min A cues.   5.   Pt will use AAC to communicate in a structured conversation setting with 75% accuracy and min cues.  Baseline: 25% Goal Status: MET 07/07/2022 Goal Status: Pt will use AAC to communicate in a structured conversation setting with 80% accuracy and intermittent cues.      LONG TERM GOALS: Target date: 08/15/2022   03/30/2022 thru 05/16/2022 Pt will utilize strategies to aid in communication breakdowns with Mod I.  Baseline: only using circumlocutions 05/16/2022 Goal status: DISCONTINUED, no viable strategies identified as successful  05/16/2022 2.  To maximize functional communication across all communication contexts using AAC and multimodal means of communication. Baseline: 25% Goal status: ONGOING      ASSESSMENT:  CLINICAL IMPRESSION: Pt continues to be inquisitive about SGD. Asking lots of questions as a result of his looking thru  device.   OBJECTIVE IMPAIRMENTS include expressive language and aphasia. These impairments are limiting patient from effectively communicating at home and in community. Factors affecting potential to achieve goals and functional outcome are  time post onset (10 years) and previous ST intervention . Patient will benefit from skilled SLP services to address above impairments and improve overall function.  REHAB POTENTIAL: Fair time post onset (10 years)  PLAN: SLP FREQUENCY: 1-2x/week  SLP DURATION: 8 weeks  PLANNED INTERVENTIONS: Language facilitation, Internal/external aids, SLP instruction and feedback, Compensatory strategies, and Patient/family education   Myli Pae B. Rutherford Nail, M.S., CCC-SLP, Mining engineer Certified Brain Injury [place]  [place] Office [phone] Ascom [phone] Fax [phone]

## 2022-07-19 ENCOUNTER — Ambulatory Visit: Payer: Medicare HMO | Admitting: Speech Pathology

## 2022-07-20 ENCOUNTER — Ambulatory Visit: Payer: Medicare HMO | Admitting: Speech Pathology

## 2022-07-20 NOTE — Therapy (Signed)
OUTPATIENT SPEECH LANGUAGE PATHOLOGY TREATMENT   Patient Name: Paul Bass MRN: [id number] DOB:08/05/1945, 76 y.o., male Today's Date: 07/18/2022  PCP: [name], NP REFERRING PROVIDER: Jennings Books, MD   End of Session - 07/18/22 1629     Visit Number 23    Number of Visits 73    Date for SLP Re-Evaluation 08/15/22    Authorization Type [employer] H[employer]    Authorization Time Period 03/30/2022 thru 08/21/2022    Authorization - Visit Number 24    Authorization - Number of Visits 43    Progress Note Due on Visit 45    SLP Start Time 1100    SLP Stop Time  1200    SLP Time Calculation (min) 60 min    Activity Tolerance Patient tolerated treatment well                       Past Medical History:  Diagnosis Date   Accelerated junctional rhythm    Anemia    Anxiety    a.) Tx'd with BZO PRN   Arthritis    Bilateral carotid artery disease ([name])    BPH (benign prostatic hypertrophy)    CAD S/P percutaneous coronary angioplasty    a.) PCI in 2002 placing a RCA stent (unknown type). b.) LHC 12/07/2015: 5% ISR m-dRCA, 90% dRCA, 25% pRCA, 25% p-mLCx, 100% OM3, 95% oOM2-OM2, 75% m-dLAD; refer to CVTS. c.) 3v CABG 01/03/2016   CHF (congestive heart failure) ([place])    a.) TTE 05/12/2012: EF 50-55%; mild LA enlargement; G1DD. b.)  TTE 11/26/2015: EF 45%; mild BAE; triv PR, mild MR/TR; inferolateral HK; G1DD. c.)  TTE 11/07/2017: EF 35%; RV enlargement; BAE; inferior and inferolateral HK; triv PR; mild MR/TR. d.)  TTE 09/17/2019: EF 35%; mild LVH; triv MR/TR. e.)  TTE 08/11/2021: EF 50%; triv MR/TR; G1DD.   Cognitive deficit following cerebrovascular accident (CVA)    COPD (chronic obstructive pulmonary disease) ([name])    Cortical cataract    Depression    Dyspnea    GERD (gastroesophageal reflux disease)    History of 2019 novel coronavirus disease (COVID-19) 01/19/2021   History of acute inferior wall MI 2002   a.) PCI was performed placing a RCA stent  (unknown type)   History of kidney stones    HTN (hypertension)    Hyperlipidemia    Hypothyroidism    Long term current use of antithrombotics/antiplatelets    a.) DAPT therapy (ASA + clopidogrel)   OSA (obstructive sleep apnea)    a.) does not require nocturnal PAP therapy   PLMD (periodic limb movement disorder)    Postoperative atrial fibrillation ([place]) 01/03/2016   a.) following CABG procedure   Prostatitis    PVD (peripheral vascular disease) ([place])    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 b/) residual RIGHT sided weakness   T2DM (type 2 diabetes mellitus) ([place])    Tobacco abuse    Past Surgical History:  Procedure Laterality Date   CARDIAC CATHETERIZATION N/A 12/07/2015   Procedure: Left Heart Cath and Coronary Angiography;  Surgeon: Corey Skains, MD;  Location: [place] [facility];  Service: Cardiovascular;  Laterality: N/A;   CAROTID ANGIOGRAPHY N/A 06/05/2022   Procedure: CAROTID ANGIOGRAPHY;  Surgeon: Algernon Huxley, MD;  Location: [facility];  Service: Cardiovascular;  Laterality: N/A;   CAROTID PTA/STENT INTERVENTION Left    HX: 2 stents   CATARACT EXTRACTION W/PHACO Left 03/24/2021   Procedure: CATARACT EXTRACTION PHACO AND INTRAOCULAR LENS PLACEMENT (IOC) LEFT DIABETIC 6.67 00:42.5;  Surgeon: Birder Robson, MD;  Location: [facility];  Service: Ophthalmology;  Laterality: Left;   COLONOSCOPY WITH PROPOFOL N/A 02/18/2019   Procedure: COLONOSCOPY WITH PROPOFOL;  Surgeon: Lucilla Lame, MD;  Location: [facility];  Service: Endoscopy;  Laterality: N/A;   COLONOSCOPY WITH PROPOFOL N/A 12/22/2021   Procedure: COLONOSCOPY WITH PROPOFOL;  Surgeon: Lucilla Lame, MD;  [facility];  Service: Endoscopy;  Laterality:  N/A;   CORONARY ANGIOPLASTY WITH STENT PLACEMENT Left 2002   CORONARY ARTERY BYPASS GRAFT N/A 01/03/2016   Procedure: 3v CORONARY ARTERY BYPASS GRAFT (LIMA-LAD, SVG-PDA, SVG-OM2); Location: [facility]; Surgeon: Dwaine Deter, MD   CYSTOSCOPY W/ URETERAL STENT REMOVAL N/A 10/04/2021   Procedure: CYSTOSCOPY WITH PROSTATE FOREIGN BODY REMOVAL;  Surgeon: Abbie Sons, MD;  Location: [facility];  Service: Urology;  Laterality: N/A;   CYSTOSCOPY WITH INSERTION OF UROLIFT N/A 04/29/2019   Procedure: CYSTOSCOPY WITH INSERTION OF UROLIFT;  Surgeon: Abbie Sons, MD;  Location: [facility];  Service: Urology;  Laterality: N/A;   ESOPHAGOGASTRODUODENOSCOPY (EGD) WITH PROPOFOL N/A 09/11/2016   Procedure: ESOPHAGOGASTRODUODENOSCOPY (EGD) WITH PROPOFOL;  Surgeon: Lucilla Lame, MD;  Location: [facility];  Service: Endoscopy;  Laterality: N/A;   ESOPHAGOGASTRODUODENOSCOPY (EGD) WITH PROPOFOL N/A 10/17/2016   Procedure: ESOPHAGOGASTRODUODENOSCOPY (EGD) WITH PROPOFOL;  Surgeon: Lucilla Lame, MD;  Location: [facility];  Service: Endoscopy;  Laterality: N/A;   ESOPHAGOGASTRODUODENOSCOPY (EGD) WITH PROPOFOL N/A 11/13/2017   Procedure: ESOPHAGOGASTRODUODENOSCOPY (EGD) WITH PROPOFOL;  Surgeon: Lucilla Lame, MD;  Location: [facility];  Service: Endoscopy;  Laterality: N/A;   pins R lower leg Left    rotator cuff replaced Right    SAVORY DILATION N/A 10/17/2016   Procedure: SAVORY DILATION;  Surgeon: Lucilla Lame, MD;  Location: [facility];  Service: Endoscopy;  Laterality: N/A;   TRANSURETHRAL INCISION OF PROSTATE N/A 10/04/2021   Procedure: TRANSURETHRAL INCISION OF THE PROSTATE (TUIP);  Surgeon: Abbie Sons, MD;  Location: [facility];  Service: Urology;  Laterality: N/A;   Patient Active Problem List   Diagnosis Date Noted   Carotid stenosis, symptomatic, with infarction ([place]) 06/05/2022   History of colonic polyps    Anxiety state 11/17/2021   Cognitive deficit, post-stroke 11/17/2021    Ex-smoker 11/17/2021   Bradycardia 71/24/5809   Chronic systolic CHF (congestive heart failure), NYHA class 2 ([place]) 05/27/2020   Accelerated junctional rhythm 04/21/2019   Encounter for screening colonoscopy    Polyp of colon    Atherosclerotic heart disease of native coronary artery without angina pectoris 01/14/2019   Depression 01/14/2019   GERD (gastroesophageal reflux disease) 01/14/2019   Periodic limb movement disorder (PLMD) 01/14/2019   Sleep apnea 01/14/2019   Type 2 diabetes mellitus without complications ([place]) 98/33/8250   Stricture and stenosis of esophagus    Problems with swallowing and mastication    Food impaction of esophagus    H/O coronary artery bypass surgery 01/17/2016   Cerebrovascular accident (CVA) due to embolism ([place]) 01/03/2016   Hypothyroidism, unspecified 01/03/2016   HLD (hyperlipidemia) 01/03/2016   Stable angina 11/29/2015   Benign essential hypertension 05/27/2015   Bilateral carotid artery stenosis 02/24/2015   Atherosclerotic peripheral vascular disease ([place]) 09/07/2014   Bladder calculus 08/12/2014   Primary osteoarthritis  of left knee 03/10/2014   Elevated prostate specific antigen (PSA) 02/05/2013   Snoring 01/22/2013   Sleepiness 01/22/2013   Dysphagia, unspecified(787.20) 01/22/2013   Occlusion and stenosis of carotid artery without mention of cerebral infarction 01/22/2013   Cerebral thrombosis with cerebral infarction ([place]) 01/22/2013   Benign prostatic hyperplasia with incomplete bladder emptying 08/26/2012   Chronic prostatitis 08/26/2012   Family history of malignant neoplasm of prostate 08/26/2012   Incomplete emptying of bladder 08/26/2012   Dysphagia 05/14/2012   Global aphasia 05/14/2012   Stroke, acute, thrombotic ([place]) 05/12/2012   Symptomatic carotid artery stenosis 05/12/2012   HTN (hypertension) 05/12/2012   COPD (chronic obstructive pulmonary disease) ([place]) 05/12/2012   Acute respiratory failure ([place]) 05/12/2012     ONSET DATE: 05/13/2012  REFERRING DIAG: T46.568 (ICD-10-CM) - Cognitive deficit, post-stroke   PERTINENT HISTORY: Pt is a 76 year old male with past medical hx of anxiety, COP, depression, diabetes mellitus without complication, HTN, hyperlipidemia.    Of note, pt received Outpatient ST services for a total of 28 sessions ending on 03/24/2020. At the end of that POC pt required moderate assistance.      DIAGNOSTIC FINDINGS:  MRI 10/03/2019  No acute intracranial abnormality. 2. Moderate-sized chronic left MCA infarct. 3. Mild chronic small vessel ischemic disease, mildly progressed from 2014. 4. Progressive cerebral atrophy.  THERAPY DIAG:  Aphasia  Left middle cerebral artery stroke [facility])  Rationale for Evaluation and Treatment Rehabilitation  SUBJECTIVE: Pt reports that he is using SGD when he is not able to say things   Pt accompanied by: self  PAIN:  Are you having pain? No  PATIENT GOALS: he would like to be able to communicate better   OBJECTIVE:   TODAY'S TREATMENT: Skilled treatment session targeted pt's word finding and communication breakdown goals. SLP facilitated session by providing the following interventions:  Pt was Mod I with using SGD to locate information and communication requested information. During today's session, pt's verbal skills were more proficient during today's session.   PATIENT EDUCATION: Education details: placing pt on hold until his wife is able to attend session for further training Person educated: Patient and wife (via text) Education method: Explanation and Handouts Education comprehension: verbalized understanding  HOME EXERCISE PROGRAM:   Note was written to his wife request they eat at one of the restaurants on his SGD to promote ability with device   GOALS: Goals reviewed with patient? Yes   SHORT TERM GOALS: Target date: 10 sessions  03/30/2022 - 05/16/2022 Pt will learn strategies (such as drawing, gestures)  to aid during communication breakdowns.  Baseline: only using circumlocutions 05/16/2022 - Goal status: DISCONTINUED, pt unable d/t motor deficits   2.  With minimal assistance, pt will use strategies to help during communication breakdowns.  Baseline: new goal 05/16/2022 Goal status: DISCONTINUED, no viable strategies identified as successful  05/16/2022  3.  Pt will respond to general questions using AAC/nonverbal communication effectively with 50% accuracy and mod cues.  Baseline: 25% Goal Status: INITIAL 07/07/2022 Goal Status: MET Updated Goal: Pt will respond to general questions using AAC/nonverbal communication effectively with 75% and Min cues.   4.  Pt will use AAC to communicate within group situations with 50% accuracy given Mod to Min cues.   Baseline: 15% Goal Status: INITIAL 07/07/2022 Goal Status: MET Updated Goal: Pt will use AAC to communication within group situations with 75% accuracy given Min A cues.   5.   Pt will use AAC to communicate in  a structured conversation setting with 75% accuracy and min cues.  Baseline: 25% Goal Status: MET 07/07/2022 Goal Status: Pt will use AAC to communicate in a structured conversation setting with 80% accuracy and intermittent cues.      LONG TERM GOALS: Target date: 08/15/2022   03/30/2022 thru 05/16/2022 Pt will utilize strategies to aid in communication breakdowns with Mod I.  Baseline: only using circumlocutions 05/16/2022 Goal status: DISCONTINUED, no viable strategies identified as successful  05/16/2022 2.  To maximize functional communication across all communication contexts using AAC and multimodal means of communication. Baseline: 25% Goal status: ONGOING      ASSESSMENT:  CLINICAL IMPRESSION: Pt continues to be inquisitive about SGD and as a result he is more proficient in locating information. At this time his SGD has been customized and recommend pt be placed on hold until his wife can come in and  attend training session.  OBJECTIVE IMPAIRMENTS include expressive language and aphasia. These impairments are limiting patient from effectively communicating at home and in community. Factors affecting potential to achieve goals and functional outcome are  time post onset (10 years) and previous ST intervention . Patient will benefit from skilled SLP services to address above impairments and improve overall function.  REHAB POTENTIAL: Fair time post onset (10 years)  PLAN: SLP FREQUENCY: 1-2x/week  SLP DURATION: 8 weeks  PLANNED INTERVENTIONS: Language facilitation, Internal/external aids, SLP instruction and feedback, Compensatory strategies, and Patient/family education   Quandre Polinski B. Rutherford Nail, M.S., CCC-SLP, Mining engineer Certified Brain Injury [place]  [place] Office [phone] Ascom [phone] Fax [phone]

## 2022-07-27 ENCOUNTER — Ambulatory Visit: Payer: Medicare HMO | Admitting: Speech Pathology

## 2022-07-28 ENCOUNTER — Ambulatory Visit: Payer: Medicare HMO | Admitting: Speech Pathology

## 2022-08-02 ENCOUNTER — Ambulatory Visit: Payer: Medicare HMO | Admitting: Speech Pathology

## 2022-08-04 ENCOUNTER — Encounter: Payer: Medicare HMO | Admitting: Speech Pathology

## 2022-08-07 ENCOUNTER — Ambulatory Visit: Payer: Medicare HMO | Admitting: Speech Pathology

## 2022-08-08 ENCOUNTER — Ambulatory Visit: Payer: Medicare HMO | Attending: Neurology | Admitting: Speech Pathology

## 2022-08-08 DIAGNOSIS — I63512 Cerebral infarction due to unspecified occlusion or stenosis of left middle cerebral artery: Secondary | ICD-10-CM | POA: Insufficient documentation

## 2022-08-08 DIAGNOSIS — R4701 Aphasia: Secondary | ICD-10-CM | POA: Diagnosis not present

## 2022-08-08 DIAGNOSIS — R482 Apraxia: Secondary | ICD-10-CM | POA: Diagnosis not present

## 2022-08-09 ENCOUNTER — Ambulatory Visit: Payer: Medicare HMO | Attending: Neurology | Admitting: Speech Pathology

## 2022-08-09 DIAGNOSIS — I63512 Cerebral infarction due to unspecified occlusion or stenosis of left middle cerebral artery: Secondary | ICD-10-CM | POA: Insufficient documentation

## 2022-08-09 DIAGNOSIS — R4701 Aphasia: Secondary | ICD-10-CM | POA: Diagnosis not present

## 2022-08-09 DIAGNOSIS — R482 Apraxia: Secondary | ICD-10-CM | POA: Diagnosis not present

## 2022-08-09 NOTE — Therapy (Signed)
OUTPATIENT SPEECH LANGUAGE PATHOLOGY TREATMENT   Patient Name: Paul Bass MRN: [id number] DOB:09-Jul-1946, 77 y.o., male Today's Date: 08/09/2022   PCP: [name], NP REFERRING PROVIDER: Jennings Books, MD   End of Session - 08/08/22 0808     Visit Number 24    Number of Visits 50    Date for SLP Re-Evaluation 08/15/22    Authorization Type [employer] H[employer]    Authorization Time Period 03/30/2022 thru 08/21/2022    Authorization - Visit Number 85    Authorization - Number of Visits 43    Progress Note Due on Visit 29    SLP Start Time 0800    SLP Stop Time  0900    SLP Time Calculation (min) 60 min    Activity Tolerance Patient tolerated treatment well                            Past Medical History:  Diagnosis Date   Accelerated junctional rhythm    Anemia    Anxiety    a.) Tx'd with BZO PRN   Arthritis    Bilateral carotid artery disease ([name])    BPH (benign prostatic hypertrophy)    CAD S/P percutaneous coronary angioplasty    a.) PCI in 2002 placing a RCA stent (unknown type). b.) LHC 12/07/2015: 5% ISR m-dRCA, 90% dRCA, 25% pRCA, 25% p-mLCx, 100% OM3, 95% oOM2-OM2, 75% m-dLAD; refer to CVTS. c.) 3v CABG 01/03/2016   CHF (congestive heart failure) ([place])    a.) TTE 05/12/2012: EF 50-55%; mild LA enlargement; G1DD. b.)  TTE 11/26/2015: EF 45%; mild BAE; triv PR, mild MR/TR; inferolateral HK; G1DD. c.)  TTE 11/07/2017: EF 35%; RV enlargement; BAE; inferior and inferolateral HK; triv PR; mild MR/TR. d.)  TTE 09/17/2019: EF 35%; mild LVH; triv MR/TR. e.)  TTE 08/11/2021: EF 50%; triv MR/TR; G1DD.   Cognitive deficit following cerebrovascular accident (CVA)    COPD (chronic obstructive pulmonary disease) ([name])    Cortical cataract    Depression    Dyspnea    GERD (gastroesophageal reflux disease)    History of 2019 novel coronavirus disease (COVID-19) 01/19/2021   History of acute inferior wall MI 2002   a.) PCI was performed placing a RCA  stent (unknown type)   History of kidney stones    HTN (hypertension)    Hyperlipidemia    Hypothyroidism    Long term current use of antithrombotics/antiplatelets    a.) DAPT therapy (ASA + clopidogrel)   OSA (obstructive sleep apnea)    a.) does not require nocturnal PAP therapy   PLMD (periodic limb movement disorder)    Postoperative atrial fibrillation ([place]) 01/03/2016   a.) following CABG procedure   Prostatitis    PVD (peripheral vascular disease) ([place])    S/P CABG x 3 01/03/2016   a.) LIMA-LAD, SVG-PDA, SVG-OM2   Stroke ([place]) 04/2012   a.) LEFT M1 occlusion from possible moderate LEFT ICA stenosis; Tx with TPA + mechanical embolectomy with Trevo Provue retrieval device with full recanalization and proximal LEFT ICA rescue stent. b/) residual RIGHT sided weakness   T2DM (type 2 diabetes mellitus) ([place])    Tobacco abuse    Past Surgical History:  Procedure Laterality Date   CARDIAC CATHETERIZATION N/A 12/07/2015   Procedure: Left Heart Cath and Coronary Angiography;  Surgeon: Corey Skains, MD;  Location: [facility];  Service: Cardiovascular;  Laterality: N/A;   CAROTID ANGIOGRAPHY N/A  06/05/2022   Procedure: CAROTID ANGIOGRAPHY;  Surgeon: Algernon Huxley, MD;  Location: [facility];  Service: Cardiovascular;  Laterality: N/A;   CAROTID PTA/STENT INTERVENTION Left    HX: 2 stents   CATARACT EXTRACTION W/PHACO Left 03/24/2021   Procedure: CATARACT EXTRACTION PHACO AND INTRAOCULAR LENS PLACEMENT (IOC) LEFT DIABETIC 6.67 00:42.5;  Surgeon: Birder Robson, MD;  Location: [facility];  Service: Ophthalmology;  Laterality: Left;   COLONOSCOPY WITH PROPOFOL N/A 02/18/2019   Procedure: COLONOSCOPY WITH PROPOFOL;  Surgeon: Lucilla Lame, MD;  Location: [facility] END[facility];  Service: Endoscopy;  Laterality: N/A;   COLONOSCOPY WITH PROPOFOL N/A 12/22/2021   Procedure: COLONOSCOPY WITH PROPOFOL;  Surgeon: Lucilla Lame, MD;  Location: [facility] ENDOSCOPY;  Service: Endoscopy;   Laterality: N/A;   CORONARY ANGIOPLASTY WITH STENT PLACEMENT Left 2002   CORONARY ARTERY BYPASS GRAFT N/A 01/03/2016   Procedure: 3v CORONARY ARTERY BYPASS GRAFT (LIMA-LAD, SVG-PDA, SVG-OM2); Location: [facility]; Surgeon: Dwaine Deter, MD   CYSTOSCOPY W/ URETERAL STENT REMOVAL N/A 10/04/2021   Procedure: CYSTOSCOPY WITH PROSTATE FOREIGN BODY REMOVAL;  Surgeon: Abbie Sons, MD;  Location: [facility];  Service: Urology;  Laterality: N/A;   CYSTOSCOPY WITH INSERTION OF UROLIFT N/A 04/29/2019   Procedure: CYSTOSCOPY WITH INSERTION OF UROLIFT;  Surgeon: Abbie Sons, MD;  Location: [facility];  Service: Urology;  Laterality: N/A;   ESOPHAGOGASTRODUODENOSCOPY (EGD) WITH PROPOFOL N/A 09/11/2016   Procedure: ESOPHAGOGASTRODUODENOSCOPY (EGD) WITH PROPOFOL;  Surgeon: Lucilla Lame, MD;  Location: [facility];  Service: Endoscopy;  Laterality: N/A;   ESOPHAGOGASTRODUODENOSCOPY (EGD) WITH PROPOFOL N/A 10/17/2016   Procedure: ESOPHAGOGASTRODUODENOSCOPY (EGD) WITH PROPOFOL;  Surgeon: Lucilla Lame, MD;  Location: [facility];  Service: Endoscopy;  Laterality: N/A;   ESOPHAGOGASTRODUODENOSCOPY (EGD) WITH PROPOFOL N/A 11/13/2017   Procedure: ESOPHAGOGASTRODUODENOSCOPY (EGD) WITH PROPOFOL;  Surgeon: Lucilla Lame, MD;  Location: [facility];  Service: Endoscopy;  Laterality: N/A;   pins R lower leg Left    rotator cuff replaced Right    SAVORY DILATION N/A 10/17/2016   Procedure: SAVORY DILATION;  Surgeon: Lucilla Lame, MD;  Location: [facility];  Service: Endoscopy;  Laterality: N/A;   TRANSURETHRAL INCISION OF PROSTATE N/A 10/04/2021   Procedure: TRANSURETHRAL INCISION OF THE PROSTATE (TUIP);  Surgeon: Abbie Sons, MD;  Location: [facility];  Service: Urology;  Laterality: N/A;   Patient Active Problem List   Diagnosis Date Noted   Carotid stenosis, symptomatic, with infarction ([place]) 06/05/2022   History of colonic polyps    Anxiety state 11/17/2021   Cognitive deficit, post-stroke 11/17/2021    Ex-smoker 11/17/2021   Bradycardia 20/25/4270   Chronic systolic CHF (congestive heart failure), NYHA class 2 ([place]) 05/27/2020   Accelerated junctional rhythm 04/21/2019   Encounter for screening colonoscopy    Polyp of colon    Atherosclerotic heart disease of native coronary artery without angina pectoris 01/14/2019   Depression 01/14/2019   GERD (gastroesophageal reflux disease) 01/14/2019   Periodic limb movement disorder (PLMD) 01/14/2019   Sleep apnea 01/14/2019   Type 2 diabetes mellitus without complications ([place]) 62/37/6283   Stricture and stenosis of esophagus    Problems with swallowing and mastication    Food impaction of esophagus    H/O coronary artery bypass surgery 01/17/2016   Cerebrovascular accident (CVA) due to embolism ([place]) 01/03/2016   Hypothyroidism, unspecified 01/03/2016   HLD (hyperlipidemia) 01/03/2016   Stable angina 11/29/2015   Benign essential hypertension 05/27/2015   Bilateral carotid artery stenosis 02/24/2015   Atherosclerotic peripheral vascular disease ([place]) 09/07/2014  Bladder calculus 08/12/2014   Primary osteoarthritis of left knee 03/10/2014   Elevated prostate specific antigen (PSA) 02/05/2013   Snoring 01/22/2013   Sleepiness 01/22/2013   Dysphagia, unspecified(787.20) 01/22/2013   Occlusion and stenosis of carotid artery without mention of cerebral infarction 01/22/2013   Cerebral thrombosis with cerebral infarction ([place]) 01/22/2013   Benign prostatic hyperplasia with incomplete bladder emptying 08/26/2012   Chronic prostatitis 08/26/2012   Family history of malignant neoplasm of prostate 08/26/2012   Incomplete emptying of bladder 08/26/2012   Dysphagia 05/14/2012   Global aphasia 05/14/2012   Stroke, acute, thrombotic ([place]) 05/12/2012   Symptomatic carotid artery stenosis 05/12/2012   HTN (hypertension) 05/12/2012   COPD (chronic obstructive pulmonary disease) ([place]) 05/12/2012   Acute respiratory failure ([place]) 05/12/2012     ONSET DATE: 05/13/2012  REFERRING DIAG: G89.169 (ICD-10-CM) - Cognitive deficit, post-stroke   PERTINENT HISTORY: Pt is a 77 year old male with past medical hx of anxiety, COP, depression, diabetes mellitus without complication, HTN, hyperlipidemia.    Of note, pt received Outpatient ST services for a total of 28 sessions ending on 03/24/2020. At the end of that POC pt required moderate assistance.      DIAGNOSTIC FINDINGS:  MRI 10/03/2019  No acute intracranial abnormality. 2. Moderate-sized chronic left MCA infarct. 3. Mild chronic small vessel ischemic disease, mildly progressed from 2014. 4. Progressive cerebral atrophy.  THERAPY DIAG:  Aphasia  Left middle cerebral artery stroke [facility])  Rationale for Evaluation and Treatment Rehabilitation  SUBJECTIVE: pt accompanied by his wife  Pt accompanied by: self  PAIN:  Are you having pain? No  PATIENT GOALS: he would like to be able to communicate better   OBJECTIVE:   TODAY'S TREATMENT: Skilled treatment session targeted pt's word finding and communication breakdown goals. SLP facilitated session by providing the following interventions:  Pt's wife attended session for the first time. She was able to provided additional content ideas such as car parts store. Pt demonstrated good ability to locate information on his device and rather than depressing the iconic, he read the information. SLP also provided instruction on ways to use the device to facilitate language within their interactions as well as social interactions.   Pt also had in his new hearing aids. He believes that he couldn't hear any better with the aids than without them. When looking at the setting on his phone, his right aid was not on and was not connected to the app. SLP assisted with connecting and moudlating the levels to improve hearing. Pt commented "I can hear myself talk now."   PATIENT EDUCATION: Education details: see above Person educated:  Patient and wife (via text) Education method: Explanation and Handouts Education comprehension: verbalized understanding  HOME EXERCISE PROGRAM:   Schedule a follow up appt with audiology   GOALS: Goals reviewed with patient? Yes   SHORT TERM GOALS: Target date: 10 sessions  03/30/2022 - 05/16/2022 Pt will learn strategies (such as drawing, gestures) to aid during communication breakdowns.  Baseline: only using circumlocutions 05/16/2022 - Goal status: DISCONTINUED, pt unable d/t motor deficits   2.  With minimal assistance, pt will use strategies to help during communication breakdowns.  Baseline: new goal 05/16/2022 Goal status: DISCONTINUED, no viable strategies identified as successful  05/16/2022  3.  Pt will respond to general questions using AAC/nonverbal communication effectively with 50% accuracy and mod cues.  Baseline: 25% Goal Status: INITIAL 07/07/2022 Goal Status: MET Updated Goal: Pt will respond to general questions using AAC/nonverbal communication  effectively with 75% and Min cues.   4.  Pt will use AAC to communicate within group situations with 50% accuracy given Mod to Min cues.   Baseline: 15% Goal Status: INITIAL 07/07/2022 Goal Status: MET Updated Goal: Pt will use AAC to communication within group situations with 75% accuracy given Min A cues.   5.   Pt will use AAC to communicate in a structured conversation setting with 75% accuracy and min cues.  Baseline: 25% Goal Status: MET 07/07/2022 Goal Status: Pt will use AAC to communicate in a structured conversation setting with 80% accuracy and intermittent cues.      LONG TERM GOALS: Target date: 08/15/2022   03/30/2022 thru 05/16/2022 Pt will utilize strategies to aid in communication breakdowns with Mod I.  Baseline: only using circumlocutions 05/16/2022 Goal status: DISCONTINUED, no viable strategies identified as successful  05/16/2022 2.  To maximize functional communication across all  communication contexts using AAC and multimodal means of communication. Baseline: 25% Goal status: ONGOING      ASSESSMENT:  CLINICAL IMPRESSION: Pt and his wife continue to utilize SGD and have some additional ways to update it for increased use.   OBJECTIVE IMPAIRMENTS include expressive language and aphasia. These impairments are limiting patient from effectively communicating at home and in community. Factors affecting potential to achieve goals and functional outcome are  time post onset (10 years) and previous ST intervention . Patient will benefit from skilled SLP services to address above impairments and improve overall function.  REHAB POTENTIAL: Fair time post onset (10 years)  PLAN: SLP FREQUENCY: 1-2x/week  SLP DURATION: 8 weeks  PLANNED INTERVENTIONS: Language facilitation, Internal/external aids, SLP instruction and feedback, Compensatory strategies, and Patient/family education   Hawraa Stambaugh B. Rutherford Nail, M.S., CCC-SLP, Mining engineer Certified Brain Injury [place]  [place] Office [phone] Ascom [phone] Fax [phone]

## 2022-08-09 NOTE — Therapy (Signed)
OUTPATIENT SPEECH LANGUAGE PATHOLOGY TREATMENT   Patient Name: Paul Bass MRN: [id number] DOB:1946/05/09, 77 y.o., male Today's Date: 08/09/2022   PCP: [name], NP REFERRING PROVIDER: Jennings Books, MD   End of Session - 08/09/22 1341     Visit Number 25    Number of Visits 31    Date for SLP Re-Evaluation 08/15/22    Authorization Type [employer] H[employer]    Authorization Time Period 03/30/2022 thru 08/21/2022    Authorization - Visit Number 24    Authorization - Number of Visits 43    Progress Note Due on Visit 33    SLP Start Time 1000    SLP Stop Time  1100    SLP Time Calculation (min) 60 min    Activity Tolerance Patient tolerated treatment well                            Past Medical History:  Diagnosis Date   Accelerated junctional rhythm    Anemia    Anxiety    a.) Tx'd with BZO PRN   Arthritis    Bilateral carotid artery disease ([name])    BPH (benign prostatic hypertrophy)    CAD S/P percutaneous coronary angioplasty    a.) PCI in 2002 placing a RCA stent (unknown type). b.) LHC 12/07/2015: 5% ISR m-dRCA, 90% dRCA, 25% pRCA, 25% p-mLCx, 100% OM3, 95% oOM2-OM2, 75% m-dLAD; refer to CVTS. c.) 3v CABG 01/03/2016   CHF (congestive heart failure) ([place])    a.) TTE 05/12/2012: EF 50-55%; mild LA enlargement; G1DD. b.)  TTE 11/26/2015: EF 45%; mild BAE; triv PR, mild MR/TR; inferolateral HK; G1DD. c.)  TTE 11/07/2017: EF 35%; RV enlargement; BAE; inferior and inferolateral HK; triv PR; mild MR/TR. d.)  TTE 09/17/2019: EF 35%; mild LVH; triv MR/TR. e.)  TTE 08/11/2021: EF 50%; triv MR/TR; G1DD.   Cognitive deficit following cerebrovascular accident (CVA)    COPD (chronic obstructive pulmonary disease) ([name])    Cortical cataract    Depression    Dyspnea    GERD (gastroesophageal reflux disease)    History of 2019 novel coronavirus disease (COVID-19) 01/19/2021   History of acute inferior wall MI 2002   a.) PCI was performed placing a RCA  stent (unknown type)   History of kidney stones    HTN (hypertension)    Hyperlipidemia    Hypothyroidism    Long term current use of antithrombotics/antiplatelets    a.) DAPT therapy (ASA + clopidogrel)   OSA (obstructive sleep apnea)    a.) does not require nocturnal PAP therapy   PLMD (periodic limb movement disorder)    Postoperative atrial fibrillation ([place]) 01/03/2016   a.) following CABG procedure   Prostatitis    PVD (peripheral vascular disease) ([place])    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 b/) residual RIGHT sided weakness   T2DM (type 2 diabetes mellitus) ([place])    Tobacco abuse    Past Surgical History:  Procedure Laterality Date   CARDIAC CATHETERIZATION N/A 12/07/2015   Procedure: Left Heart Cath and Coronary Angiography;  Surgeon: Corey Skains, MD;  Location: [facility];  Service: Cardiovascular;  Laterality: N/A;   CAROTID ANGIOGRAPHY N/A  06/05/2022   Procedure: CAROTID ANGIOGRAPHY;  Surgeon: Algernon Huxley, MD;  Location: [facility];  Service: Cardiovascular;  Laterality: N/A;   CAROTID PTA/STENT INTERVENTION Left    HX: 2 stents   CATARACT EXTRACTION W/PHACO Left 03/24/2021   Procedure: CATARACT EXTRACTION PHACO AND INTRAOCULAR LENS PLACEMENT (IOC) LEFT DIABETIC 6.67 00:42.5;  Surgeon: Birder Robson, MD;  Location: [facility];  Service: Ophthalmology;  Laterality: Left;   COLONOSCOPY WITH PROPOFOL N/A 02/18/2019   Procedure: COLONOSCOPY WITH PROPOFOL;  Surgeon: Lucilla Lame, MD;  Location: [facility];  Service: Endoscopy;  Laterality: N/A;   COLONOSCOPY WITH PROPOFOL N/A 12/22/2021   Procedure: COLONOSCOPY WITH PROPOFOL;  Surgeon: Lucilla Lame, MD;  Location: [facility] END[facility];  Service: Endoscopy;   Laterality: N/A;   CORONARY ANGIOPLASTY WITH STENT PLACEMENT Left 2002   CORONARY ARTERY BYPASS GRAFT N/A 01/03/2016   Procedure: 3v CORONARY ARTERY BYPASS GRAFT (LIMA-LAD, SVG-PDA, SVG-OM2); Location: [facility]; Surgeon: Dwaine Deter, MD   CYSTOSCOPY W/ URETERAL STENT REMOVAL N/A 10/04/2021   Procedure: CYSTOSCOPY WITH PROSTATE FOREIGN BODY REMOVAL;  Surgeon: Abbie Sons, MD;  Location: [facility];  Service: Urology;  Laterality: N/A;   CYSTOSCOPY WITH INSERTION OF UROLIFT N/A 04/29/2019   Procedure: CYSTOSCOPY WITH INSERTION OF UROLIFT;  Surgeon: Abbie Sons, MD;  Location: [facility];  Service: Urology;  Laterality: N/A;   ESOPHAGOGASTRODUODENOSCOPY (EGD) WITH PROPOFOL N/A 09/11/2016   Procedure: ESOPHAGOGASTRODUODENOSCOPY (EGD) WITH PROPOFOL;  Surgeon: Lucilla Lame, MD;  Location: [facility];  Service: Endoscopy;  Laterality: N/A;   ESOPHAGOGASTRODUODENOSCOPY (EGD) WITH PROPOFOL N/A 10/17/2016   Procedure: ESOPHAGOGASTRODUODENOSCOPY (EGD) WITH PROPOFOL;  Surgeon: Lucilla Lame, MD;  Location: [facility];  Service: Endoscopy;  Laterality: N/A;   ESOPHAGOGASTRODUODENOSCOPY (EGD) WITH PROPOFOL N/A 11/13/2017   Procedure: ESOPHAGOGASTRODUODENOSCOPY (EGD) WITH PROPOFOL;  Surgeon: Lucilla Lame, MD;  Location: [facility];  Service: Endoscopy;  Laterality: N/A;   pins R lower leg Left    rotator cuff replaced Right    SAVORY DILATION N/A 10/17/2016   Procedure: SAVORY DILATION;  Surgeon: Lucilla Lame, MD;  Location: [facility];  Service: Endoscopy;  Laterality: N/A;   TRANSURETHRAL INCISION OF PROSTATE N/A 10/04/2021   Procedure: TRANSURETHRAL INCISION OF THE PROSTATE (TUIP);  Surgeon: Abbie Sons, MD;  Location: [facility];  Service: Urology;  Laterality: N/A;   Patient Active Problem List   Diagnosis Date Noted   Carotid stenosis, symptomatic, with infarction ([place]) 06/05/2022   History of colonic polyps    Anxiety state 11/17/2021   Cognitive deficit, post-stroke 11/17/2021    Ex-smoker 11/17/2021   Bradycardia 81/19/1478   Chronic systolic CHF (congestive heart failure), NYHA class 2 ([place]) 05/27/2020   Accelerated junctional rhythm 04/21/2019   Encounter for screening colonoscopy    Polyp of colon    Atherosclerotic heart disease of native coronary artery without angina pectoris 01/14/2019   Depression 01/14/2019   GERD (gastroesophageal reflux disease) 01/14/2019   Periodic limb movement disorder (PLMD) 01/14/2019   Sleep apnea 01/14/2019   Type 2 diabetes mellitus without complications ([place]) 29/56/2130   Stricture and stenosis of esophagus    Problems with swallowing and mastication    Food impaction of esophagus    H/O coronary artery bypass surgery 01/17/2016   Cerebrovascular accident (CVA) due to embolism ([place]) 01/03/2016   Hypothyroidism, unspecified 01/03/2016   HLD (hyperlipidemia) 01/03/2016   Stable angina 11/29/2015   Benign essential hypertension 05/27/2015   Bilateral carotid artery stenosis 02/24/2015   Atherosclerotic peripheral vascular disease ([place]) 09/07/2014  Bladder calculus 08/12/2014   Primary osteoarthritis of left knee 03/10/2014   Elevated prostate specific antigen (PSA) 02/05/2013   Snoring 01/22/2013   Sleepiness 01/22/2013   Dysphagia, unspecified(787.20) 01/22/2013   Occlusion and stenosis of carotid artery without mention of cerebral infarction 01/22/2013   Cerebral thrombosis with cerebral infarction ([place]) 01/22/2013   Benign prostatic hyperplasia with incomplete bladder emptying 08/26/2012   Chronic prostatitis 08/26/2012   Family history of malignant neoplasm of prostate 08/26/2012   Incomplete emptying of bladder 08/26/2012   Dysphagia 05/14/2012   Global aphasia 05/14/2012   Stroke, acute, thrombotic ([place]) 05/12/2012   Symptomatic carotid artery stenosis 05/12/2012   HTN (hypertension) 05/12/2012   COPD (chronic obstructive pulmonary disease) ([place]) 05/12/2012   Acute respiratory failure ([place]) 05/12/2012     ONSET DATE: 05/13/2012  REFERRING DIAG: Q11.941 (ICD-10-CM) - Cognitive deficit, post-stroke   PERTINENT HISTORY: Pt is a 77 year old male with past medical hx of anxiety, COP, depression, diabetes mellitus without complication, HTN, hyperlipidemia.    Of note, pt received Outpatient ST services for a total of 28 sessions ending on 03/24/2020. At the end of that POC pt required moderate assistance.      DIAGNOSTIC FINDINGS:  MRI 10/03/2019  No acute intracranial abnormality. 2. Moderate-sized chronic left MCA infarct. 3. Mild chronic small vessel ischemic disease, mildly progressed from 2014. 4. Progressive cerebral atrophy.  THERAPY DIAG:  Aphasia  Apraxia  Left middle cerebral artery stroke [facility])  Rationale for Evaluation and Treatment Rehabilitation  SUBJECTIVE: pt accompanied by his wife  Pt accompanied by: self  PAIN:  Are you having pain? No  PATIENT GOALS: he would like to be able to communicate better   OBJECTIVE:   TODAY'S TREATMENT: Skilled treatment session targeted pt's word finding and communication breakdown goals. SLP facilitated session by providing the following interventions:  In collaboration with pt, SLP added an additional icon "Hearing Aids" The following information was customized within the folder:  Left one is not working  Right one is not working  They will not charge  That is too loud  That is too soft Pt also required moderate cues to locate information on his SGD like his wife's contact information.  In addition, SLP customized an alert card explaining pt's aphasia for pt to keep in his wallet.    PATIENT EDUCATION: Education details: see above Person educated: Patient and wife (via text) Education method: Explanation and Handouts Education comprehension: verbalized understanding  HOME EXERCISE PROGRAM:   Schedule a follow up appt with audiology   GOALS: Goals reviewed with patient? Yes   SHORT TERM GOALS: Target  date: 10 sessions  03/30/2022 - 05/16/2022 Pt will learn strategies (such as drawing, gestures) to aid during communication breakdowns.  Baseline: only using circumlocutions 05/16/2022 - Goal status: DISCONTINUED, pt unable d/t motor deficits   2.  With minimal assistance, pt will use strategies to help during communication breakdowns.  Baseline: new goal 05/16/2022 Goal status: DISCONTINUED, no viable strategies identified as successful  05/16/2022  3.  Pt will respond to general questions using AAC/nonverbal communication effectively with 50% accuracy and mod cues.  Baseline: 25% Goal Status: INITIAL 07/07/2022 Goal Status: MET Updated Goal: Pt will respond to general questions using AAC/nonverbal communication effectively with 75% and Min cues.   4.  Pt will use AAC to communicate within group situations with 50% accuracy given Mod to Min cues.   Baseline: 15% Goal Status: INITIAL 07/07/2022 Goal Status: MET Updated Goal: Pt will use  AAC to communication within group situations with 75% accuracy given Min A cues.   5.   Pt will use AAC to communicate in a structured conversation setting with 75% accuracy and min cues.  Baseline: 25% Goal Status: MET 07/07/2022 Goal Status: Pt will use AAC to communicate in a structured conversation setting with 80% accuracy and intermittent cues.      LONG TERM GOALS: Target date: 08/15/2022   03/30/2022 thru 05/16/2022 Pt will utilize strategies to aid in communication breakdowns with Mod I.  Baseline: only using circumlocutions 05/16/2022 Goal status: DISCONTINUED, no viable strategies identified as successful  05/16/2022 2.  To maximize functional communication across all communication contexts using AAC and multimodal means of communication. Baseline: 25% Goal status: ONGOING      ASSESSMENT:  CLINICAL IMPRESSION: Pt and his wife continue to utilize SGD and have some additional ways to update it for increased use.    OBJECTIVE IMPAIRMENTS include expressive language and aphasia. These impairments are limiting patient from effectively communicating at home and in community. Factors affecting potential to achieve goals and functional outcome are  time post onset (10 years) and previous ST intervention . Patient will benefit from skilled SLP services to address above impairments and improve overall function.  REHAB POTENTIAL: Fair time post onset (10 years)  PLAN: SLP FREQUENCY: 1-2x/week  SLP DURATION: 8 weeks  PLANNED INTERVENTIONS: Language facilitation, Internal/external aids, SLP instruction and feedback, Compensatory strategies, and Patient/family education   Makinsley Schiavi B. Rutherford Nail, M.S., CCC-SLP, Mining engineer Certified Brain Injury [place]  [place] Office [phone] Ascom [phone] Fax [phone]

## 2022-08-15 ENCOUNTER — Ambulatory Visit: Payer: Medicare HMO | Admitting: Speech Pathology

## 2022-08-15 DIAGNOSIS — I63512 Cerebral infarction due to unspecified occlusion or stenosis of left middle cerebral artery: Secondary | ICD-10-CM

## 2022-08-15 DIAGNOSIS — R4701 Aphasia: Secondary | ICD-10-CM | POA: Diagnosis not present

## 2022-08-15 DIAGNOSIS — R482 Apraxia: Secondary | ICD-10-CM

## 2022-08-15 NOTE — Therapy (Signed)
OUTPATIENT SPEECH LANGUAGE PATHOLOGY TREATMENT   RE-CERTIFICATION REQUEST  Patient Name: Paul Bass MRN: [id number] DOB:05-Dec-1945, 77 y.o., male Today's Date: 08/15/2022   PCP: [name], NP REFERRING PROVIDER: Jennings Books, MD   End of Session - 08/15/22 1231     Visit Number 26    Number of Visits 72    Date for SLP Re-Evaluation 11/07/22    Authorization Type [employer] H[employer]    Authorization Time Period 03/30/2022 thru 08/21/2022    Authorization - Visit Number 25    Authorization - Number of Visits 43    Progress Note Due on Visit 67    SLP Start Time 0900    SLP Stop Time  1000    SLP Time Calculation (min) 60 min    Activity Tolerance Patient tolerated treatment well                            Past Medical History:  Diagnosis Date   Accelerated junctional rhythm    Anemia    Anxiety    a.) Tx'd with BZO PRN   Arthritis    Bilateral carotid artery disease ([name])    BPH (benign prostatic hypertrophy)    CAD S/P percutaneous coronary angioplasty    a.) PCI in 2002 placing a RCA stent (unknown type). b.) LHC 12/07/2015: 5% ISR m-dRCA, 90% dRCA, 25% pRCA, 25% p-mLCx, 100% OM3, 95% oOM2-OM2, 75% m-dLAD; refer to CVTS. c.) 3v CABG 01/03/2016   CHF (congestive heart failure) ([place])    a.) TTE 05/12/2012: EF 50-55%; mild LA enlargement; G1DD. b.)  TTE 11/26/2015: EF 45%; mild BAE; triv PR, mild MR/TR; inferolateral HK; G1DD. c.)  TTE 11/07/2017: EF 35%; RV enlargement; BAE; inferior and inferolateral HK; triv PR; mild MR/TR. d.)  TTE 09/17/2019: EF 35%; mild LVH; triv MR/TR. e.)  TTE 08/11/2021: EF 50%; triv MR/TR; G1DD.   Cognitive deficit following cerebrovascular accident (CVA)    COPD (chronic obstructive pulmonary disease) ([name])    Cortical cataract    Depression    Dyspnea    GERD (gastroesophageal reflux disease)    History of 2019 novel coronavirus disease (COVID-19) 01/19/2021   History of acute inferior wall MI 2002   a.) PCI  was performed placing a RCA stent (unknown type)   History of kidney stones    HTN (hypertension)    Hyperlipidemia    Hypothyroidism    Long term current use of antithrombotics/antiplatelets    a.) DAPT therapy (ASA + clopidogrel)   OSA (obstructive sleep apnea)    a.) does not require nocturnal PAP therapy   PLMD (periodic limb movement disorder)    Postoperative atrial fibrillation ([place]) 01/03/2016   a.) following CABG procedure   Prostatitis    PVD (peripheral vascular disease) ([place])    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 b/) residual RIGHT sided weakness   T2DM (type 2 diabetes mellitus) ([place])    Tobacco abuse    Past Surgical History:  Procedure Laterality Date   CARDIAC CATHETERIZATION N/A 12/07/2015   Procedure: Left Heart Cath and Coronary Angiography;  Surgeon: Corey Skains, MD;  Location: [facility];  Service: Cardiovascular;  Laterality: N/A;  CAROTID ANGIOGRAPHY N/A 06/05/2022   Procedure: CAROTID ANGIOGRAPHY;  Surgeon: Algernon Huxley, MD;  Location: [facility];  Service: Cardiovascular;  Laterality: N/A;   CAROTID PTA/STENT INTERVENTION Left    HX: 2 stents   CATARACT EXTRACTION W/PHACO Left 03/24/2021   Procedure: CATARACT EXTRACTION PHACO AND INTRAOCULAR LENS PLACEMENT (IOC) LEFT DIABETIC 6.67 00:42.5;  Surgeon: Birder Robson, MD;  Location: [facility];  Service: Ophthalmology;  Laterality: Left;   COLONOSCOPY WITH PROPOFOL N/A 02/18/2019   Procedure: COLONOSCOPY WITH PROPOFOL;  Surgeon: Lucilla Lame, MD;  Location: [facility];  Service: Endoscopy;  Laterality: N/A;   COLONOSCOPY WITH PROPOFOL N/A 12/22/2021   Procedure: COLONOSCOPY WITH PROPOFOL;  Surgeon: Lucilla Lame, MD;  Location: [facility];   Service: Endoscopy;  Laterality: N/A;   CORONARY ANGIOPLASTY WITH STENT PLACEMENT Left 2002   CORONARY ARTERY BYPASS GRAFT N/A 01/03/2016   Procedure: 3v CORONARY ARTERY BYPASS GRAFT (LIMA-LAD, SVG-PDA, SVG-OM2); Location: [facility]; Surgeon: Dwaine Deter, MD   CYSTOSCOPY W/ URETERAL STENT REMOVAL N/A 10/04/2021   Procedure: CYSTOSCOPY WITH PROSTATE FOREIGN BODY REMOVAL;  Surgeon: Abbie Sons, MD;  Location: [facility];  Service: Urology;  Laterality: N/A;   CYSTOSCOPY WITH INSERTION OF UROLIFT N/A 04/29/2019   Procedure: CYSTOSCOPY WITH INSERTION OF UROLIFT;  Surgeon: Abbie Sons, MD;  Location: [facility];  Service: Urology;  Laterality: N/A;   ESOPHAGOGASTRODUODENOSCOPY (EGD) WITH PROPOFOL N/A 09/11/2016   Procedure: ESOPHAGOGASTRODUODENOSCOPY (EGD) WITH PROPOFOL;  Surgeon: Lucilla Lame, MD;  Location: [facility];  Service: Endoscopy;  Laterality: N/A;   ESOPHAGOGASTRODUODENOSCOPY (EGD) WITH PROPOFOL N/A 10/17/2016   Procedure: ESOPHAGOGASTRODUODENOSCOPY (EGD) WITH PROPOFOL;  Surgeon: Lucilla Lame, MD;  Location: [facility];  Service: Endoscopy;  Laterality: N/A;   ESOPHAGOGASTRODUODENOSCOPY (EGD) WITH PROPOFOL N/A 11/13/2017   Procedure: ESOPHAGOGASTRODUODENOSCOPY (EGD) WITH PROPOFOL;  Surgeon: Lucilla Lame, MD;  Location: [facility];  Service: Endoscopy;  Laterality: N/A;   pins R lower leg Left    rotator cuff replaced Right    SAVORY DILATION N/A 10/17/2016   Procedure: SAVORY DILATION;  Surgeon: Lucilla Lame, MD;  Location: [facility];  Service: Endoscopy;  Laterality: N/A;   TRANSURETHRAL INCISION OF PROSTATE N/A 10/04/2021   Procedure: TRANSURETHRAL INCISION OF THE PROSTATE (TUIP);  Surgeon: Abbie Sons, MD;  Location: [facility];  Service: Urology;  Laterality: N/A;   Patient Active Problem List   Diagnosis Date Noted   Carotid stenosis, symptomatic, with infarction ([place]) 06/05/2022   History of colonic polyps    Anxiety state 11/17/2021   Cognitive deficit,  post-stroke 11/17/2021   Ex-smoker 11/17/2021   Bradycardia 52/84/1324   Chronic systolic CHF (congestive heart failure), NYHA class 2 ([place]) 05/27/2020   Accelerated junctional rhythm 04/21/2019   Encounter for screening colonoscopy    Polyp of colon    Atherosclerotic heart disease of native coronary artery without angina pectoris 01/14/2019   Depression 01/14/2019   GERD (gastroesophageal reflux disease) 01/14/2019   Periodic limb movement disorder (PLMD) 01/14/2019   Sleep apnea 01/14/2019   Type 2 diabetes mellitus without complications ([place]) 40/04/2724   Stricture and stenosis of esophagus    Problems with swallowing and mastication    Food impaction of esophagus    H/O coronary artery bypass surgery 01/17/2016   Cerebrovascular accident (CVA) due to embolism ([place]) 01/03/2016   Hypothyroidism, unspecified 01/03/2016   HLD (hyperlipidemia) 01/03/2016   Stable angina 11/29/2015   Benign essential hypertension 05/27/2015   Bilateral carotid artery stenosis 02/24/2015   Atherosclerotic peripheral vascular disease ([place])  09/07/2014   Bladder calculus 08/12/2014   Primary osteoarthritis of left knee 03/10/2014   Elevated prostate specific antigen (PSA) 02/05/2013   Snoring 01/22/2013   Sleepiness 01/22/2013   Dysphagia, unspecified(787.20) 01/22/2013   Occlusion and stenosis of carotid artery without mention of cerebral infarction 01/22/2013   Cerebral thrombosis with cerebral infarction ([place]) 01/22/2013   Benign prostatic hyperplasia with incomplete bladder emptying 08/26/2012   Chronic prostatitis 08/26/2012   Family history of malignant neoplasm of prostate 08/26/2012   Incomplete emptying of bladder 08/26/2012   Dysphagia 05/14/2012   Global aphasia 05/14/2012   Stroke, acute, thrombotic ([place]) 05/12/2012   Symptomatic carotid artery stenosis 05/12/2012   HTN (hypertension) 05/12/2012   COPD (chronic obstructive pulmonary disease) ([place]) 05/12/2012   Acute respiratory  failure ([place]) 05/12/2012    ONSET DATE: 05/13/2012  REFERRING DIAG: Y09.983 (ICD-10-CM) - Cognitive deficit, post-stroke   PERTINENT HISTORY: Pt is a 77 year old male with past medical hx of anxiety, COP, depression, diabetes mellitus without complication, HTN, hyperlipidemia.    Of note, pt received Outpatient ST services for a total of 28 sessions ending on 03/24/2020. At the end of that POC pt required moderate assistance.      DIAGNOSTIC FINDINGS:  MRI 10/03/2019  No acute intracranial abnormality. 2. Moderate-sized chronic left MCA infarct. 3. Mild chronic small vessel ischemic disease, mildly progressed from 2014. 4. Progressive cerebral atrophy.  THERAPY DIAG:  Aphasia  Apraxia  Left middle cerebral artery stroke [facility])  Rationale for Evaluation and Treatment Rehabilitation  SUBJECTIVE: Pt presented in a pleasant mood but did state he was having issues hearing a difference with or without his hearing aids.   Pt accompanied by: self  PAIN:  Are you having pain? No  PATIENT GOALS: he would like to be able to communicate better   OBJECTIVE:   TODAY'S TREATMENT: Skilled treatment session targeted pt's word finding and communication breakdown goals. SLP facilitated session by providing the following interventions:  During treatment, SLP facilitated conversation utilizing the patients speech generating device. During session, patient switched between verbal speech and his device when needing assistance with word finding difficulties. However, the client did require consistent cueing to use the device when having difficulties. He also needed moderate cueing to locate icons relating to specific questions. He seemed to struggle most when pictures on the icons were abstract as opposed to concrete images that pertained to his real life. The client would benefit from further teaching pertaining to each individual icon, what they open up to, and how his device is organized.    PATIENT EDUCATION: Education details: see above Person educated: Patient and wife (via text) Education method: Explanation and Handouts Education comprehension: verbalized understanding  HOME EXERCISE PROGRAM:   Schedule a follow up appt with audiology   GOALS: Goals reviewed with patient? Yes   SHORT TERM GOALS: Target date: 10 sessions UPDATED: 05/16/2022 UPDATED: 07/07/2022 Pt will learn strategies (such as drawing, gestures) to aid during communication breakdowns.         Baseline: only using circumlocutions 05/16/2022  Goal status: DISCONTINUED, pt unable d/t motor deficits   2.  With minimal assistance, pt will use strategies to help during communication breakdowns.  Baseline: new goal 05/16/2022  Goal status: DISCONTINUED, no viable strategies identified as successful  3.  Pt will respond to general questions using AAC/nonverbal communication effectively with 50% accuracy and mod cues.  Baseline: 25% Goal Status: INITIAL 07/07/2022 Goal Status: MET Updated Goal: Pt will respond to general questions  using AAC/nonverbal communication effectively with 75% and Min cues.  08/15/2022 Goal Status: ONGOING  4.  Pt will use AAC to communicate within group situations with 50% accuracy given Mod to Min cues.   Baseline: 15% Goal Status: INITIAL 07/07/2022 Goal Status: MET Updated Goal: Pt will use AAC to communication within group situations with 75% accuracy given Min A cues.  08/15/2022 Goal Status: ONGOING  5.   Pt will use AAC to communicate in a structured conversation setting with 75% accuracy and min cues.  Baseline: 25% Goal Status: MET 07/07/2022 Goal Status: MET Updated Goal: Pt will use AAC to communicate in a structured conversation setting with 80% accuracy and intermittent cues.  08/15/2022 Goal Status: ONGOING     LONG TERM GOALS: Target date: 11/07/2022  UPDATED: 05/16/2022  UPDATED: 08/15/2022 Pt will utilize strategies to aid in communication  breakdowns with Mod I.  Baseline: only using circumlocutions Goal status: DISCONTINUED, no viable strategies identified as successful  2.  To maximize functional communication across all communication contexts using AAC and multimodal means of communication. Baseline: 25% Goal status: ONGOING      ASSESSMENT:  CLINICAL IMPRESSION: Recommend further practice using device, spending more time in each category of icons to further familiarize the patient where to locate information.   OBJECTIVE IMPAIRMENTS include expressive language and aphasia. These impairments are limiting patient from effectively communicating at home and in community. Factors affecting potential to achieve goals and functional outcome are  time post onset (10 years) and previous ST intervention . Patient will benefit from skilled SLP services to address above impairments and improve overall function.  REHAB POTENTIAL: Fair time post onset (10 years)  PLAN: SLP FREQUENCY: 1-2x/week  SLP DURATION: 8 weeks  PLANNED INTERVENTIONS: Language facilitation, Internal/external aids, SLP instruction and feedback, Compensatory strategies, and Patient/family education   Lasonja Lakins B. Rutherford Nail, M.S., CCC-SLP, Mining engineer Certified Brain Injury [place]  [place] Office [phone] Ascom [phone] Fax [phone]

## 2022-08-16 DIAGNOSIS — B029 Zoster without complications: Secondary | ICD-10-CM | POA: Diagnosis not present

## 2022-08-17 ENCOUNTER — Ambulatory Visit: Payer: Medicare HMO | Admitting: Speech Pathology

## 2022-08-17 DIAGNOSIS — I10 Essential (primary) hypertension: Secondary | ICD-10-CM | POA: Diagnosis not present

## 2022-08-17 DIAGNOSIS — J439 Emphysema, unspecified: Secondary | ICD-10-CM | POA: Diagnosis not present

## 2022-08-17 DIAGNOSIS — I2510 Atherosclerotic heart disease of native coronary artery without angina pectoris: Secondary | ICD-10-CM | POA: Diagnosis not present

## 2022-08-17 DIAGNOSIS — I63512 Cerebral infarction due to unspecified occlusion or stenosis of left middle cerebral artery: Secondary | ICD-10-CM | POA: Diagnosis not present

## 2022-08-17 DIAGNOSIS — R4701 Aphasia: Secondary | ICD-10-CM

## 2022-08-17 DIAGNOSIS — R482 Apraxia: Secondary | ICD-10-CM | POA: Diagnosis not present

## 2022-08-17 DIAGNOSIS — E039 Hypothyroidism, unspecified: Secondary | ICD-10-CM | POA: Diagnosis not present

## 2022-08-17 DIAGNOSIS — K219 Gastro-esophageal reflux disease without esophagitis: Secondary | ICD-10-CM | POA: Diagnosis not present

## 2022-08-17 DIAGNOSIS — I639 Cerebral infarction, unspecified: Secondary | ICD-10-CM | POA: Diagnosis not present

## 2022-08-17 DIAGNOSIS — N401 Enlarged prostate with lower urinary tract symptoms: Secondary | ICD-10-CM | POA: Diagnosis not present

## 2022-08-17 DIAGNOSIS — E118 Type 2 diabetes mellitus with unspecified complications: Secondary | ICD-10-CM | POA: Diagnosis not present

## 2022-08-17 DIAGNOSIS — G4730 Sleep apnea, unspecified: Secondary | ICD-10-CM | POA: Diagnosis not present

## 2022-08-17 NOTE — Therapy (Signed)
OUTPATIENT SPEECH LANGUAGE PATHOLOGY TREATMENT     Patient Name: Paul Bass MRN: [id number] DOB:14-Dec-1945, 77 y.o., male Today's Date: 08/17/2022   PCP: [name], NP REFERRING PROVIDER: Jennings Books, MD   End of Session - 08/17/22 1002     Visit Number 27    Number of Visits 24    Date for SLP Re-Evaluation 11/07/22    Authorization Type [employer] H[employer]    Authorization Time Period 03/30/2022 thru 08/21/2022    Authorization - Visit Number 63    Authorization - Number of Visits 43    Progress Note Due on Visit 39    SLP Start Time 0900    SLP Stop Time  1000    SLP Time Calculation (min) 60 min    Activity Tolerance Patient tolerated treatment well                            Past Medical History:  Diagnosis Date   Accelerated junctional rhythm    Anemia    Anxiety    a.) Tx'd with BZO PRN   Arthritis    Bilateral carotid artery disease ([name])    BPH (benign prostatic hypertrophy)    CAD S/P percutaneous coronary angioplasty    a.) PCI in 2002 placing a RCA stent (unknown type). b.) LHC 12/07/2015: 5% ISR m-dRCA, 90% dRCA, 25% pRCA, 25% p-mLCx, 100% OM3, 95% oOM2-OM2, 75% m-dLAD; refer to CVTS. c.) 3v CABG 01/03/2016   CHF (congestive heart failure) ([place])    a.) TTE 05/12/2012: EF 50-55%; mild LA enlargement; G1DD. b.)  TTE 11/26/2015: EF 45%; mild BAE; triv PR, mild MR/TR; inferolateral HK; G1DD. c.)  TTE 11/07/2017: EF 35%; RV enlargement; BAE; inferior and inferolateral HK; triv PR; mild MR/TR. d.)  TTE 09/17/2019: EF 35%; mild LVH; triv MR/TR. e.)  TTE 08/11/2021: EF 50%; triv MR/TR; G1DD.   Cognitive deficit following cerebrovascular accident (CVA)    COPD (chronic obstructive pulmonary disease) ([name])    Cortical cataract    Depression    Dyspnea    GERD (gastroesophageal reflux disease)    History of 2019 novel coronavirus disease (COVID-19) 01/19/2021   History of acute inferior wall MI 2002   a.) PCI was performed placing a  RCA stent (unknown type)   History of kidney stones    HTN (hypertension)    Hyperlipidemia    Hypothyroidism    Long term current use of antithrombotics/antiplatelets    a.) DAPT therapy (ASA + clopidogrel)   OSA (obstructive sleep apnea)    a.) does not require nocturnal PAP therapy   PLMD (periodic limb movement disorder)    Postoperative atrial fibrillation ([place]) 01/03/2016   a.) following CABG procedure   Prostatitis    PVD (peripheral vascular disease) ([place])    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 b/) residual RIGHT sided weakness   T2DM (type 2 diabetes mellitus) ([place])    Tobacco abuse    Past Surgical History:  Procedure Laterality Date   CARDIAC CATHETERIZATION N/A 12/07/2015   Procedure: Left Heart Cath and Coronary Angiography;  Surgeon: Corey Skains, MD;  Location: [place] [facility];  Service: Cardiovascular;  Laterality: N/A;   CAROTID  ANGIOGRAPHY N/A 06/05/2022   Procedure: CAROTID ANGIOGRAPHY;  Surgeon: Algernon Huxley, MD;  Location: [place] [facility];  Service: Cardiovascular;  Laterality: N/A;   CAROTID PTA/STENT INTERVENTION Left    HX: 2 stents   CATARACT EXTRACTION W/PHACO Left 03/24/2021   Procedure: CATARACT EXTRACTION PHACO AND INTRAOCULAR LENS PLACEMENT (IOC) LEFT DIABETIC 6.67 00:42.5;  Surgeon: Birder Robson, MD;  Location: [facility];  Service: Ophthalmology;  Laterality: Left;   COLONOSCOPY WITH PROPOFOL N/A 02/18/2019   Procedure: COLONOSCOPY WITH PROPOFOL;  Surgeon: Lucilla Lame, MD;  Location[facility];  Service: Endoscopy;  Laterality: N/A;   COLONOSCOPY WITH PROPOFOL N/A 12/22/2021   Procedure: COLONOSCOPY WITH PROPOFOL;  Surgeon: Lucilla Lame, MD;  Location[facility];  Service: Endoscopy;   Laterality: N/A;   CORONARY ANGIOPLASTY WITH STENT PLACEMENT Left 2002   CORONARY ARTERY BYPASS GRAFT N/A 01/03/2016   Procedure: 3v CORONARY ARTERY BYPASS GRAFT (LIMA-LAD, SVG-PDA, SVG-OM2); Location: [facility]; Surgeon: Dwaine Deter, MD   CYSTOSCOPY W/ URETERAL STENT REMOVAL N/A 10/04/2021   Procedure: CYSTOSCOPY WITH PROSTATE FOREIGN BODY REMOVAL;  Surgeon: Abbie Sons, MD;  Location: [facility];  Service: Urology;  Laterality: N/A;   CYSTOSCOPY WITH INSERTION OF UROLIFT N/A 04/29/2019   Procedure: CYSTOSCOPY WITH INSERTION OF UROLIFT;  Surgeon: Abbie Sons, MD;  Location: [facility];  Service: Urology;  Laterality: N/A;   ESOPHAGOGASTRODUODENOSCOPY (EGD) WITH PROPOFOL N/A 09/11/2016   Procedure: ESOPHAGOGASTRODUODENOSCOPY (EGD) WITH PROPOFOL;  Surgeon: Lucilla Lame, MD;  Location: [facility] ENDOSCOPY;  Service: Endoscopy;  Laterality: N/A;   ESOPHAGOGASTRODUODENOSCOPY (EGD) WITH PROPOFOL N/A 10/17/2016   Procedure: ESOPHAGOGASTRODUODENOSCOPY (EGD) WITH PROPOFOL;  Surgeon: Lucilla Lame, MD;  Location: [facility];  Service: Endoscopy;  Laterality: N/A;   ESOPHAGOGASTRODUODENOSCOPY (EGD) WITH PROPOFOL N/A 11/13/2017   Procedure: ESOPHAGOGASTRODUODENOSCOPY (EGD) WITH PROPOFOL;  Surgeon: Lucilla Lame, MD;  Location: [facility] ENDOS[facility];  Service: Endoscopy;  Laterality: N/A;   pins R lower leg Left    rotator cuff replaced Right    SAVORY DILATION N/A 10/17/2016   Procedure: SAVORY DILATION;  Surgeon: Lucilla Lame, MD;  Location: [facility] ENDOSCOPY;  Service: Endoscopy;  Laterality: N/A;   TRANSURETHRAL INCISION OF PROSTATE N/A 10/04/2021   Procedure: TRANSURETHRAL INCISION OF THE PROSTATE (TUIP);  Surgeon: Abbie Sons, MD;  Location: [facility];  Service: Urology;  Laterality: N/A;   Patient Active Problem List   Diagnosis Date Noted   Carotid stenosis, symptomatic, with infarction ([place]) 06/05/2022   History of colonic polyps    Anxiety state 11/17/2021   Cognitive deficit, post-stroke 11/17/2021    Ex-smoker 11/17/2021   Bradycardia 22/08/5425   Chronic systolic CHF (congestive heart failure), NYHA class 2 ([place]) 05/27/2020   Accelerated junctional rhythm 04/21/2019   Encounter for screening colonoscopy    Polyp of colon    Atherosclerotic heart disease of native coronary artery without angina pectoris 01/14/2019   Depression 01/14/2019   GERD (gastroesophageal reflux disease) 01/14/2019   Periodic limb movement disorder (PLMD) 01/14/2019   Sleep apnea 01/14/2019   Type 2 diabetes mellitus without complications ([place]) 01/21/7627   Stricture and stenosis of esophagus    Problems with swallowing and mastication    Food impaction of esophagus    H/O coronary artery bypass surgery 01/17/2016   Cerebrovascular accident (CVA) due to embolism ([place]) 01/03/2016   Hypothyroidism, unspecified 01/03/2016   HLD (hyperlipidemia) 01/03/2016   Stable angina 11/29/2015   Benign essential hypertension 05/27/2015   Bilateral carotid artery stenosis 02/24/2015   Atherosclerotic peripheral vascular disease ([place]) 09/07/2014  Bladder calculus 08/12/2014   Primary osteoarthritis of left knee 03/10/2014   Elevated prostate specific antigen (PSA) 02/05/2013   Snoring 01/22/2013   Sleepiness 01/22/2013   Dysphagia, unspecified(787.20) 01/22/2013   Occlusion and stenosis of carotid artery without mention of cerebral infarction 01/22/2013   Cerebral thrombosis with cerebral infarction ([place]) 01/22/2013   Benign prostatic hyperplasia with incomplete bladder emptying 08/26/2012   Chronic prostatitis 08/26/2012   Family history of malignant neoplasm of prostate 08/26/2012   Incomplete emptying of bladder 08/26/2012   Dysphagia 05/14/2012   Global aphasia 05/14/2012   Stroke, acute, thrombotic ([place]) 05/12/2012   Symptomatic carotid artery stenosis 05/12/2012   HTN (hypertension) 05/12/2012   COPD (chronic obstructive pulmonary disease) ([place]) 05/12/2012   Acute respiratory failure ([place]) 05/12/2012     ONSET DATE: 05/13/2012  REFERRING DIAG: D32.671 (ICD-10-CM) - Cognitive deficit, post-stroke   PERTINENT HISTORY: Pt is a 77 year old male with past medical hx of anxiety, COP, depression, diabetes mellitus without complication, HTN, hyperlipidemia.    Of note, pt received Outpatient ST services for a total of 28 sessions ending on 03/24/2020. At the end of that POC pt required moderate assistance.      DIAGNOSTIC FINDINGS:  MRI 10/03/2019  No acute intracranial abnormality. 2. Moderate-sized chronic left MCA infarct. 3. Mild chronic small vessel ischemic disease, mildly progressed from 2014. 4. Progressive cerebral atrophy.  THERAPY DIAG:  Aphasia  Rationale for Evaluation and Treatment Rehabilitation  SUBJECTIVE: Pt presented in a pleasant mood and stated that he decided to keep his hearing aids.   Pt accompanied by: self  PAIN:  Are you having pain? No  PATIENT GOALS: he would like to be able to communicate better   OBJECTIVE:   TODAY'S TREATMENT: Skilled treatment session targeted pt's word finding and communication breakdown goals. SLP facilitated session by providing the following interventions:  During treatment, SLP facilitated conversation utilizing the patients speech generating device. During session, patient switched between verbal speech and his device when needing assistance with word finding difficulties. SLP utilized a Occupational hygienist device to demonstrate the types of information he would find in his "About Me" section. The PT walked the SLP through his "About Me" Section, going over every icon. The PT required cueing to locate his hearing aids icon and benefited from practice, requiring no cueing on the last attempt. When asked to locate his grandkids icon, he benefited from a fill in the blank cue of, "your grandkids are apart of your ____(family)."    PATIENT EDUCATION: Education details: see above Person educated: Patient and wife (via  text) Education method: Explanation and Handouts Education comprehension: verbalized understanding  HOME EXERCISE PROGRAM:   Take pictures of items you buy at the parts store.    GOALS: Goals reviewed with patient? Yes   SHORT TERM GOALS: Target date: 10 sessions UPDATED: 05/16/2022 UPDATED: 07/07/2022 Pt will learn strategies (such as drawing, gestures) to aid during communication breakdowns.         Baseline: only using circumlocutions 05/16/2022  Goal status: DISCONTINUED, pt unable d/t motor deficits   2.  With minimal assistance, pt will use strategies to help during communication breakdowns.  Baseline: new goal 05/16/2022  Goal status: DISCONTINUED, no viable strategies identified as successful  3.  Pt will respond to general questions using AAC/nonverbal communication effectively with 50% accuracy and mod cues.  Baseline: 25% Goal Status: INITIAL 07/07/2022 Goal Status: MET Updated Goal: Pt will respond to general questions using AAC/nonverbal communication effectively with  75% and Min cues.  08/15/2022 Goal Status: ONGOING  4.  Pt will use AAC to communicate within group situations with 50% accuracy given Mod to Min cues.   Baseline: 15% Goal Status: INITIAL 07/07/2022 Goal Status: MET Updated Goal: Pt will use AAC to communication within group situations with 75% accuracy given Min A cues.  08/15/2022 Goal Status: ONGOING  5.   Pt will use AAC to communicate in a structured conversation setting with 75% accuracy and min cues.  Baseline: 25% Goal Status: MET 07/07/2022 Goal Status: MET Updated Goal: Pt will use AAC to communicate in a structured conversation setting with 80% accuracy and intermittent cues.  08/15/2022 Goal Status: ONGOING     LONG TERM GOALS: Target date: 11/07/2022  UPDATED: 05/16/2022  UPDATED: 08/15/2022 Pt will utilize strategies to aid in communication breakdowns with Mod I.  Baseline: only using circumlocutions Goal status:  DISCONTINUED, no viable strategies identified as successful  2.  To maximize functional communication across all communication contexts using AAC and multimodal means of communication. Baseline: 25% Goal status: ONGOING      ASSESSMENT:  CLINICAL IMPRESSION: Recommend further practice using device, spending more time in each category of icons to further familiarize the patient where to locate information.   OBJECTIVE IMPAIRMENTS include expressive language and aphasia. These impairments are limiting patient from effectively communicating at home and in community. Factors affecting potential to achieve goals and functional outcome are  time post onset (10 years) and previous ST intervention . Patient will benefit from skilled SLP services to address above impairments and improve overall function.  REHAB POTENTIAL: Fair time post onset (10 years)  PLAN: SLP FREQUENCY: 1-2x/week  SLP DURATION: 8 weeks  PLANNED INTERVENTIONS: Language facilitation, Internal/external aids, SLP instruction and feedback, Compensatory strategies, and Patient/family education  Alphonzo Grieve, SLP Graduate Clinician    Happi B. Rutherford Nail, M.S., CCC-SLP, Mining engineer Certified Brain Injury [place]  [place] Office [phone] Ascom [phone] Fax [phone]

## 2022-08-18 ENCOUNTER — Ambulatory Visit: Payer: Medicare HMO | Admitting: Urology

## 2022-08-22 ENCOUNTER — Ambulatory Visit: Payer: Medicare HMO | Admitting: Speech Pathology

## 2022-08-24 ENCOUNTER — Ambulatory Visit: Payer: Medicare HMO | Admitting: Speech Pathology

## 2022-08-24 DIAGNOSIS — R482 Apraxia: Secondary | ICD-10-CM

## 2022-08-24 DIAGNOSIS — I63512 Cerebral infarction due to unspecified occlusion or stenosis of left middle cerebral artery: Secondary | ICD-10-CM

## 2022-08-24 DIAGNOSIS — R4701 Aphasia: Secondary | ICD-10-CM

## 2022-08-24 NOTE — Therapy (Signed)
OUTPATIENT SPEECH LANGUAGE PATHOLOGY TREATMENT     Patient Name: Paul Bass MRN: [id number] DOB:August 30, 1945, 77 y.o., male Today's Date: 08/24/2022   PCP: [name], NP REFERRING PROVIDER: Jennings Books, MD   End of Session - 08/24/22 1217     Visit Number 28    Number of Visits 70    Date for SLP Re-Evaluation 11/07/22    Authorization Type [employer] H[employer]    Authorization Time Period 06/04/2023 thru 11/07/2022    Authorization - Visit Number 76    Authorization - Number of Visits 36    Progress Note Due on Visit 36    SLP Start Time 0900    SLP Stop Time  1000    SLP Time Calculation (min) 60 min    Activity Tolerance Patient tolerated treatment well                            Past Medical History:  Diagnosis Date   Accelerated junctional rhythm    Anemia    Anxiety    a.) Tx'd with BZO PRN   Arthritis    Bilateral carotid artery disease ([name])    BPH (benign prostatic hypertrophy)    CAD S/P percutaneous coronary angioplasty    a.) PCI in 2002 placing a RCA stent (unknown type). b.) LHC 12/07/2015: 5% ISR m-dRCA, 90% dRCA, 25% pRCA, 25% p-mLCx, 100% OM3, 95% oOM2-OM2, 75% m-dLAD; refer to CVTS. c.) 3v CABG 01/03/2016   CHF (congestive heart failure) ([place])    a.) TTE 05/12/2012: EF 50-55%; mild LA enlargement; G1DD. b.)  TTE 11/26/2015: EF 45%; mild BAE; triv PR, mild MR/TR; inferolateral HK; G1DD. c.)  TTE 11/07/2017: EF 35%; RV enlargement; BAE; inferior and inferolateral HK; triv PR; mild MR/TR. d.)  TTE 09/17/2019: EF 35%; mild LVH; triv MR/TR. e.)  TTE 08/11/2021: EF 50%; triv MR/TR; G1DD.   Cognitive deficit following cerebrovascular accident (CVA)    COPD (chronic obstructive pulmonary disease) ([name])    Cortical cataract    Depression    Dyspnea    GERD (gastroesophageal reflux disease)    History of 2019 novel coronavirus disease (COVID-19) 01/19/2021   History of acute inferior wall MI 2002   a.) PCI was performed placing a  RCA stent (unknown type)   History of kidney stones    HTN (hypertension)    Hyperlipidemia    Hypothyroidism    Long term current use of antithrombotics/antiplatelets    a.) DAPT therapy (ASA + clopidogrel)   OSA (obstructive sleep apnea)    a.) does not require nocturnal PAP therapy   PLMD (periodic limb movement disorder)    Postoperative atrial fibrillation ([place]) 01/03/2016   a.) following CABG procedure   Prostatitis    PVD (peripheral vascular disease) ([place])    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 b/) residual RIGHT sided weakness   T2DM (type 2 diabetes mellitus) ([place])    Tobacco abuse    Past Surgical History:  Procedure Laterality Date   CARDIAC CATHETERIZATION N/A 12/07/2015   Procedure: Left Heart Cath and Coronary Angiography;  Surgeon: Corey Skains, MD;  Location: [facility];  Service: Cardiovascular;  Laterality: N/A;   CAROTID  ANGIOGRAPHY N/A 06/05/2022   Procedure: CAROTID ANGIOGRAPHY;  Surgeon: Algernon Huxley, MD;  Location: [facility];  Service: Cardiovascular;  Laterality: N/A;   CAROTID PTA/STENT INTERVENTION Left    HX: 2 stents   CATARACT EXTRACTION W/PHACO Left 03/24/2021   Procedure: CATARACT EXTRACTION PHACO AND INTRAOCULAR LENS PLACEMENT (IOC) LEFT DIABETIC 6.67 00:42.5;  Surgeon: Birder Robson, MD;  Location: [facility];  Service: Ophthalmology;  Laterality: Left;   COLONOSCOPY WITH PROPOFOL N/A 02/18/2019   Procedure: COLONOSCOPY WITH PROPOFOL;  Surgeon: Lucilla Lame, MD;  Location: [facility] END[facility];  Service: Endoscopy;  Laterality: N/A;   COLONOSCOPY WITH PROPOFOL N/A 12/22/2021   Procedure: COLONOSCOPY WITH PROPOFOL;  Surgeon: Lucilla Lame, MD;  Location: [facility];  Service: Endoscopy;   Laterality: N/A;   CORONARY ANGIOPLASTY WITH STENT PLACEMENT Left 2002   CORONARY ARTERY BYPASS GRAFT N/A 01/03/2016   Procedure: 3v CORONARY ARTERY BYPASS GRAFT (LIMA-LAD, SVG-PDA, SVG-OM2); Location: [facility]; Surgeon: Dwaine Deter, MD   CYSTOSCOPY W/ URETERAL STENT REMOVAL N/A 10/04/2021   Procedure: CYSTOSCOPY WITH PROSTATE FOREIGN BODY REMOVAL;  Surgeon: Abbie Sons, MD;  Location: [facility];  Service: Urology;  Laterality: N/A;   CYSTOSCOPY WITH INSERTION OF UROLIFT N/A 04/29/2019   Procedure: CYSTOSCOPY WITH INSERTION OF UROLIFT;  Surgeon: Abbie Sons, MD;  Location: [facility];  Service: Urology;  Laterality: N/A;   ESOPHAGOGASTRODUODENOSCOPY (EGD) WITH PROPOFOL N/A 09/11/2016   Procedure: ESOPHAGOGASTRODUODENOSCOPY (EGD) WITH PROPOFOL;  Surgeon: Lucilla Lame, MD;  Location: [facility];  Service: Endoscopy;  Laterality: N/A;   ESOPHAGOGASTRODUODENOSCOPY (EGD) WITH PROPOFOL N/A 10/17/2016   Procedure: ESOPHAGOGASTRODUODENOSCOPY (EGD) WITH PROPOFOL;  Surgeon: Lucilla Lame, MD;  Location: [facility] END[facility];  Service: Endoscopy;  Laterality: N/A;   ESOPHAGOGASTRODUODENOSCOPY (EGD) WITH PROPOFOL N/A 11/13/2017   Procedure: ESOPHAGOGASTRODUODENOSCOPY (EGD) WITH PROPOFOL;  Surgeon: Lucilla Lame, MD;  Location: [facility] END[facility];  Service: Endoscopy;  Laterality: N/A;   pins R lower leg Left    rotator cuff replaced Right    SAVORY DILATION N/A 10/17/2016   Procedure: SAVORY DILATION;  Surgeon: Lucilla Lame, MD;  Location: [facility] END[facility]COPY;  Service: Endoscopy;  Laterality: N/A;   TRANSURETHRAL INCISION OF PROSTATE N/A 10/04/2021   Procedure: TRANSURETHRAL INCISION OF THE PROSTATE (TUIP);  Surgeon: Abbie Sons, MD;  Location: [facility];  Service: Urology;  Laterality: N/A;   Patient Active Problem List   Diagnosis Date Noted   Carotid stenosis, symptomatic, with infarction ([place]) 06/05/2022   History of colonic polyps    Anxiety state 11/17/2021   Cognitive deficit, post-stroke 11/17/2021    Ex-smoker 11/17/2021   Bradycardia 18/56/3149   Chronic systolic CHF (congestive heart failure), NYHA class 2 ([place]) 05/27/2020   Accelerated junctional rhythm 04/21/2019   Encounter for screening colonoscopy    Polyp of colon    Atherosclerotic heart disease of native coronary artery without angina pectoris 01/14/2019   Depression 01/14/2019   GERD (gastroesophageal reflux disease) 01/14/2019   Periodic limb movement disorder (PLMD) 01/14/2019   Sleep apnea 01/14/2019   Type 2 diabetes mellitus without complications ([place]) 70/26/3785   Stricture and stenosis of esophagus    Problems with swallowing and mastication    Food impaction of esophagus    H/O coronary artery bypass surgery 01/17/2016   Cerebrovascular accident (CVA) due to embolism ([place]) 01/03/2016   Hypothyroidism, unspecified 01/03/2016   HLD (hyperlipidemia) 01/03/2016   Stable angina 11/29/2015   Benign essential hypertension 05/27/2015   Bilateral carotid artery stenosis 02/24/2015   Atherosclerotic peripheral vascular disease ([place]) 09/07/2014  Bladder calculus 08/12/2014   Primary osteoarthritis of left knee 03/10/2014   Elevated prostate specific antigen (PSA) 02/05/2013   Snoring 01/22/2013   Sleepiness 01/22/2013   Dysphagia, unspecified(787.20) 01/22/2013   Occlusion and stenosis of carotid artery without mention of cerebral infarction 01/22/2013   Cerebral thrombosis with cerebral infarction ([place]) 01/22/2013   Benign prostatic hyperplasia with incomplete bladder emptying 08/26/2012   Chronic prostatitis 08/26/2012   Family history of malignant neoplasm of prostate 08/26/2012   Incomplete emptying of bladder 08/26/2012   Dysphagia 05/14/2012   Global aphasia 05/14/2012   Stroke, acute, thrombotic ([place]) 05/12/2012   Symptomatic carotid artery stenosis 05/12/2012   HTN (hypertension) 05/12/2012   COPD (chronic obstructive pulmonary disease) ([place]) 05/12/2012   Acute respiratory failure ([place]) 05/12/2012     ONSET DATE: 05/13/2012  REFERRING DIAG: N86.767 (ICD-10-CM) - Cognitive deficit, post-stroke   PERTINENT HISTORY: Pt is a 77 year old male with past medical hx of anxiety, COP, depression, diabetes mellitus without complication, HTN, hyperlipidemia.    Of note, pt received Outpatient ST services for a total of 28 sessions ending on 03/24/2020. At the end of that POC pt required moderate assistance.      DIAGNOSTIC FINDINGS:  MRI 10/03/2019  No acute intracranial abnormality. 2. Moderate-sized chronic left MCA infarct. 3. Mild chronic small vessel ischemic disease, mildly progressed from 2014. 4. Progressive cerebral atrophy.  THERAPY DIAG:  Aphasia  Apraxia  Left middle cerebral artery stroke [facility])  Rationale for Evaluation and Treatment Rehabilitation  SUBJECTIVE: Pt presented in a pleasant mood and stated that he decided to keep his hearing aids.   Pt accompanied by: self  PAIN:  Are you having pain? No  PATIENT GOALS: he would like to be able to communicate better   OBJECTIVE:   TODAY'S TREATMENT: Skilled treatment session targeted pt's word finding and communication breakdown goals. SLP facilitated session by providing the following interventions:  SLP in consultation with pt added the following icons and communicative utterances to increase pt's ability to take of his vehicles. In addition actual pictures of each object were used as his wife sent in pictures of each to aid in pt understanding.    2017 [place]  Oil filter  Air filter  2008 Smart Car Oil filter Air filter    PATIENT EDUCATION: Education details: see above Person educated: Patient and wife (via text) Education method: Theatre stage manager Education comprehension: verbalized understanding  HOME EXERCISE PROGRAM:   Take pictures of items you buy at the parts store.    GOALS: Goals reviewed with patient? Yes    SHORT TERM GOALS: Target date: 10 sessions UPDATED: 05/16/2022 UPDATED: 07/07/2022 Pt will learn strategies (such as drawing, gestures) to aid during communication breakdowns.         Baseline: only using circumlocutions 05/16/2022  Goal status: DISCONTINUED, pt unable d/t motor deficits   2.  With minimal assistance, pt will use strategies to help during communication breakdowns.  Baseline: new goal 05/16/2022  Goal status: DISCONTINUED, no viable strategies identified as successful  3.  Pt will respond to general questions using AAC/nonverbal communication effectively with 50% accuracy and mod cues.  Baseline: 25% Goal Status: INITIAL 07/07/2022 Goal Status: MET Updated Goal: Pt will respond to general questions using AAC/nonverbal communication effectively with 75% and Min cues.  08/15/2022 Goal Status: ONGOING  4.  Pt will use AAC to communicate within group situations  with 50% accuracy given Mod to Min cues.   Baseline: 15% Goal Status: INITIAL 07/07/2022 Goal Status: MET Updated Goal: Pt will use AAC to communication within group situations with 75% accuracy given Min A cues.  08/15/2022 Goal Status: ONGOING  5.   Pt will use AAC to communicate in a structured conversation setting with 75% accuracy and min cues.  Baseline: 25% Goal Status: MET 07/07/2022 Goal Status: MET Updated Goal: Pt will use AAC to communicate in a structured conversation setting with 80% accuracy and intermittent cues.  08/15/2022 Goal Status: ONGOING     LONG TERM GOALS: Target date: 11/07/2022  UPDATED: 05/16/2022  UPDATED: 08/15/2022 Pt will utilize strategies to aid in communication breakdowns with Mod I.  Baseline: only using circumlocutions Goal status: DISCONTINUED, no viable strategies identified as successful  2.  To maximize functional communication across all communication contexts using AAC and multimodal means of communication. Baseline: 25% Goal status: ONGOING       ASSESSMENT:  CLINICAL IMPRESSION: Recommend further practice using device, spending more time in each category of icons to further familiarize the patient where to locate information.   OBJECTIVE IMPAIRMENTS include expressive language and aphasia. These impairments are limiting patient from effectively communicating at home and in community. Factors affecting potential to achieve goals and functional outcome are  time post onset (10 years) and previous ST intervention . Patient will benefit from skilled SLP services to address above impairments and improve overall function.  REHAB POTENTIAL: Fair time post onset (10 years)  PLAN: SLP FREQUENCY: 1-2x/week  SLP DURATION: 8 weeks  PLANNED INTERVENTIONS: Language facilitation, Internal/external aids, SLP instruction and feedback, Compensatory strategies, and Patient/family education  Alphonzo Grieve, SLP Graduate Clinician    Daris Harkins B. Rutherford Nail, M.S., CCC-SLP, Mining engineer Certified Brain Injury [place]  [place] Office [phone] Ascom [phone] Fax [phone]

## 2022-08-29 ENCOUNTER — Ambulatory Visit: Payer: Medicare HMO | Admitting: Speech Pathology

## 2022-08-29 DIAGNOSIS — I63512 Cerebral infarction due to unspecified occlusion or stenosis of left middle cerebral artery: Secondary | ICD-10-CM | POA: Diagnosis not present

## 2022-08-29 DIAGNOSIS — R482 Apraxia: Secondary | ICD-10-CM | POA: Diagnosis not present

## 2022-08-29 DIAGNOSIS — R4701 Aphasia: Secondary | ICD-10-CM

## 2022-08-29 NOTE — Therapy (Signed)
OUTPATIENT SPEECH LANGUAGE PATHOLOGY TREATMENT     Patient Name: Paul Bass MRN: [id number] DOB:02-13-1946, 77 y.o., male Today's Date: 08/29/2022   PCP: [name], NP REFERRING PROVIDER: Jennings Books, MD   End of Session - 08/29/22 0857     Visit Number 29    Number of Visits 71    Date for SLP Re-Evaluation 11/07/22    Authorization Type [employer] H[employer]    Authorization Time Period 06/04/2023 thru 11/07/2022    Authorization - Visit Number 82    Authorization - Number of Visits 36    Progress Note Due on Visit 36    SLP Start Time 0800    SLP Stop Time  0900    SLP Time Calculation (min) 60 min    Activity Tolerance Patient tolerated treatment well                            Past Medical History:  Diagnosis Date   Accelerated junctional rhythm    Anemia    Anxiety    a.) Tx'd with BZO PRN   Arthritis    Bilateral carotid artery disease ([name])    BPH (benign prostatic hypertrophy)    CAD S/P percutaneous coronary angioplasty    a.) PCI in 2002 placing a RCA stent (unknown type). b.) LHC 12/07/2015: 5% ISR m-dRCA, 90% dRCA, 25% pRCA, 25% p-mLCx, 100% OM3, 95% oOM2-OM2, 75% m-dLAD; refer to CVTS. c.) 3v CABG 01/03/2016   CHF (congestive heart failure) ([place])    a.) TTE 05/12/2012: EF 50-55%; mild LA enlargement; G1DD. b.)  TTE 11/26/2015: EF 45%; mild BAE; triv PR, mild MR/TR; inferolateral HK; G1DD. c.)  TTE 11/07/2017: EF 35%; RV enlargement; BAE; inferior and inferolateral HK; triv PR; mild MR/TR. d.)  TTE 09/17/2019: EF 35%; mild LVH; triv MR/TR. e.)  TTE 08/11/2021: EF 50%; triv MR/TR; G1DD.   Cognitive deficit following cerebrovascular accident (CVA)    COPD (chronic obstructive pulmonary disease) ([name])    Cortical cataract    Depression    Dyspnea    GERD (gastroesophageal reflux disease)    History of 2019 novel coronavirus disease (COVID-19) 01/19/2021   History of acute inferior wall MI 2002   a.) PCI was performed placing a  RCA stent (unknown type)   History of kidney stones    HTN (hypertension)    Hyperlipidemia    Hypothyroidism    Long term current use of antithrombotics/antiplatelets    a.) DAPT therapy (ASA + clopidogrel)   OSA (obstructive sleep apnea)    a.) does not require nocturnal PAP therapy   PLMD (periodic limb movement disorder)    Postoperative atrial fibrillation ([place]) 01/03/2016   a.) following CABG procedure   Prostatitis    PVD (peripheral vascular disease) ([place])    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 b/) residual RIGHT sided weakness   T2DM (type 2 diabetes mellitus) ([place])    Tobacco abuse    Past Surgical History:  Procedure Laterality Date   CARDIAC CATHETERIZATION N/A 12/07/2015   Procedure: Left Heart Cath and Coronary Angiography;  Surgeon: Corey Skains, MD;  Location: [place] [facility];  Service: Cardiovascular;  Laterality: N/A;   CAROTID  ANGIOGRAPHY N/A 06/05/2022   Procedure: CAROTID ANGIOGRAPHY;  Surgeon: Algernon Huxley, MD;  Location: [place] [facility];  Service: Cardiovascular;  Laterality: N/A;   CAROTID PTA/STENT INTERVENTION Left    HX: 2 stents   CATARACT EXTRACTION W/PHACO Left 03/24/2021   Procedure: CATARACT EXTRACTION PHACO AND INTRAOCULAR LENS PLACEMENT (IOC) LEFT DIABETIC 6.67 00:42.5;  Surgeon: Birder Robson, MD;  Location: [facility];  Service: Ophthalmology;  Laterality: Left;   COLONOSCOPY WITH PROPOFOL N/A 02/18/2019   Procedure: COLONOSCOPY WITH PROPOFOL;  Surgeon: Lucilla Lame, MD;  Location[facility];  Service: Endoscopy;  Laterality: N/A;   COLONOSCOPY WITH PROPOFOL N/A 12/22/2021   Procedure: COLONOSCOPY WITH PROPOFOL;  Surgeon: Lucilla Lame, MD;  Location: [facility] ENDOSCOPY;  Service: Endoscopy;   Laterality: N/A;   CORONARY ANGIOPLASTY WITH STENT PLACEMENT Left 2002   CORONARY ARTERY BYPASS GRAFT N/A 01/03/2016   Procedure: 3v CORONARY ARTERY BYPASS GRAFT (LIMA-LAD, SVG-PDA, SVG-OM2); Location: [facility]; Surgeon: Dwaine Deter, MD   CYSTOSCOPY W/ URETERAL STENT REMOVAL N/A 10/04/2021   Procedure: CYSTOSCOPY WITH PROSTATE FOREIGN BODY REMOVAL;  Surgeon: Abbie Sons, MD;  Location: [facility];  Service: Urology;  Laterality: N/A;   CYSTOSCOPY WITH INSERTION OF UROLIFT N/A 04/29/2019   Procedure: CYSTOSCOPY WITH INSERTION OF UROLIFT;  Surgeon: Abbie Sons, MD;  Location: [facility];  Service: Urology;  Laterality: N/A;   ESOPHAGOGASTRODUODENOSCOPY (EGD) WITH PROPOFOL N/A 09/11/2016   Procedure: ESOPHAGOGASTRODUODENOSCOPY (EGD) WITH PROPOFOL;  Surgeon: Lucilla Lame, MD;  Location: [facility] ENDOSCOPY;  Service: Endoscopy;  Laterality: N/A;   ESOPHAGOGASTRODUODENOSCOPY (EGD) WITH PROPOFOL N/A 10/17/2016   Procedure: ESOPHAGOGASTRODUODENOSCOPY (EGD) WITH PROPOFOL;  Surgeon: Lucilla Lame, MD;  Location: [facility] ENDOS[facility];  Service: Endoscopy;  Laterality: N/A;   ESOPHAGOGASTRODUODENOSCOPY (EGD) WITH PROPOFOL N/A 11/13/2017   Procedure: ESOPHAGOGASTRODUODENOSCOPY (EGD) WITH PROPOFOL;  Surgeon: Lucilla Lame, MD;  Location: [facility] ENDOSCOPY;  Service: Endoscopy;  Laterality: N/A;   pins R lower leg Left    rotator cuff replaced Right    SAVORY DILATION N/A 10/17/2016   Procedure: SAVORY DILATION;  Surgeon: Lucilla Lame, MD;  Location: [facility] ENDOSCOPY;  Service: Endoscopy;  Laterality: N/A;   TRANSURETHRAL INCISION OF PROSTATE N/A 10/04/2021   Procedure: TRANSURETHRAL INCISION OF THE PROSTATE (TUIP);  Surgeon: Abbie Sons, MD;  Location: [facility];  Service: Urology;  Laterality: N/A;   Patient Active Problem List   Diagnosis Date Noted   Carotid stenosis, symptomatic, with infarction ([place]) 06/05/2022   History of colonic polyps    Anxiety state 11/17/2021   Cognitive deficit, post-stroke 11/17/2021    Ex-smoker 11/17/2021   Bradycardia 98/92/1194   Chronic systolic CHF (congestive heart failure), NYHA class 2 ([place]) 05/27/2020   Accelerated junctional rhythm 04/21/2019   Encounter for screening colonoscopy    Polyp of colon    Atherosclerotic heart disease of native coronary artery without angina pectoris 01/14/2019   Depression 01/14/2019   GERD (gastroesophageal reflux disease) 01/14/2019   Periodic limb movement disorder (PLMD) 01/14/2019   Sleep apnea 01/14/2019   Type 2 diabetes mellitus without complications ([place]) 17/40/8144   Stricture and stenosis of esophagus    Problems with swallowing and mastication    Food impaction of esophagus    H/O coronary artery bypass surgery 01/17/2016   Cerebrovascular accident (CVA) due to embolism ([place]) 01/03/2016   Hypothyroidism, unspecified 01/03/2016   HLD (hyperlipidemia) 01/03/2016   Stable angina 11/29/2015   Benign essential hypertension 05/27/2015   Bilateral carotid artery stenosis 02/24/2015   Atherosclerotic peripheral vascular disease ([place]) 09/07/2014  Bladder calculus 08/12/2014   Primary osteoarthritis of left knee 03/10/2014   Elevated prostate specific antigen (PSA) 02/05/2013   Snoring 01/22/2013   Sleepiness 01/22/2013   Dysphagia, unspecified(787.20) 01/22/2013   Occlusion and stenosis of carotid artery without mention of cerebral infarction 01/22/2013   Cerebral thrombosis with cerebral infarction ([place]) 01/22/2013   Benign prostatic hyperplasia with incomplete bladder emptying 08/26/2012   Chronic prostatitis 08/26/2012   Family history of malignant neoplasm of prostate 08/26/2012   Incomplete emptying of bladder 08/26/2012   Dysphagia 05/14/2012   Global aphasia 05/14/2012   Stroke, acute, thrombotic ([place]) 05/12/2012   Symptomatic carotid artery stenosis 05/12/2012   HTN (hypertension) 05/12/2012   COPD (chronic obstructive pulmonary disease) ([place]) 05/12/2012   Acute respiratory failure ([place]) 05/12/2012     ONSET DATE: 05/13/2012  REFERRING DIAG: T24.580 (ICD-10-CM) - Cognitive deficit, post-stroke   PERTINENT HISTORY: Pt is a 77 year old male with past medical hx of anxiety, COP, depression, diabetes mellitus without complication, HTN, hyperlipidemia.    Of note, pt received Outpatient ST services for a total of 28 sessions ending on 03/24/2020. At the end of that POC pt required moderate assistance.      DIAGNOSTIC FINDINGS:  MRI 10/03/2019  No acute intracranial abnormality. 2. Moderate-sized chronic left MCA infarct. 3. Mild chronic small vessel ischemic disease, mildly progressed from 2014. 4. Progressive cerebral atrophy.  THERAPY DIAG:  Aphasia  Rationale for Evaluation and Treatment Rehabilitation  SUBJECTIVE: Pt presented in a pleasant mood and stated that he has been having trouble with his hearing aids.   Pt accompanied by: self  PAIN:  Are you having pain? No  PATIENT GOALS: he would like to be able to communicate better   OBJECTIVE:   TODAY'S TREATMENT: Skilled treatment session targeted pt's word finding and communication breakdown goals. SLP facilitated session by providing the following interventions:  During treatment, SLP facilitated conversation utilizing the patients speech generating device. During session, patient switched between verbal speech and his device when needing assistance with word finding difficulties. SLP added new icons to pt's device regarding pt's hearing aids to assist pt in his audiologist appointment this week and aid in asking questions. When prompted to discuss topics regarding pt's family, pt required moderate cueing to locate buttons and responded well to fill in the blank prompts. After repeated trials of going into the "About Me" section, pt answered a question about his grand kids independently.       PATIENT EDUCATION: Education details: see above Person educated: Patient and wife (via text) Education method: Explanation  and Handouts Education comprehension: verbalized understanding  HOME EXERCISE PROGRAM:   Take pictures of items you buy at the parts store.    GOALS: Goals reviewed with patient? Yes   SHORT TERM GOALS: Target date: 10 sessions UPDATED: 05/16/2022 UPDATED: 07/07/2022 Pt will learn strategies (such as drawing, gestures) to aid during communication breakdowns.         Baseline: only using circumlocutions 05/16/2022  Goal status: DISCONTINUED, pt unable d/t motor deficits   2.  With minimal assistance, pt will use strategies to help during communication breakdowns.  Baseline: new goal 05/16/2022  Goal status: DISCONTINUED, no viable strategies identified as successful  3.  Pt will respond to general questions using AAC/nonverbal communication effectively with 50% accuracy and mod cues.  Baseline: 25% Goal Status: INITIAL 07/07/2022 Goal Status: MET Updated Goal: Pt will respond to general questions using AAC/nonverbal communication effectively with 75% and Min cues.  08/15/2022 Goal  Status: ONGOING  4.  Pt will use AAC to communicate within group situations with 50% accuracy given Mod to Min cues.   Baseline: 15% Goal Status: INITIAL 07/07/2022 Goal Status: MET Updated Goal: Pt will use AAC to communication within group situations with 75% accuracy given Min A cues.  08/15/2022 Goal Status: ONGOING  5.   Pt will use AAC to communicate in a structured conversation setting with 75% accuracy and min cues.  Baseline: 25% Goal Status: MET 07/07/2022 Goal Status: MET Updated Goal: Pt will use AAC to communicate in a structured conversation setting with 80% accuracy and intermittent cues.  08/15/2022 Goal Status: ONGOING     LONG TERM GOALS: Target date: 11/07/2022  UPDATED: 05/16/2022  UPDATED: 08/15/2022 Pt will utilize strategies to aid in communication breakdowns with Mod I.  Baseline: only using circumlocutions Goal status: DISCONTINUED, no viable strategies  identified as successful  2.  To maximize functional communication across all communication contexts using AAC and multimodal means of communication. Baseline: 25% Goal status: ONGOING      ASSESSMENT:  CLINICAL IMPRESSION: Recommend further practice using device, spending more time in each category of icons to further familiarize the patient where to locate information.   OBJECTIVE IMPAIRMENTS include expressive language and aphasia. These impairments are limiting patient from effectively communicating at home and in community. Factors affecting potential to achieve goals and functional outcome are  time post onset (10 years) and previous ST intervention . Patient will benefit from skilled SLP services to address above impairments and improve overall function.  REHAB POTENTIAL: Fair time post onset (10 years)  PLAN: SLP FREQUENCY: 1-2x/week  SLP DURATION: 8 weeks  PLANNED INTERVENTIONS: Language facilitation, Internal/external aids, SLP instruction and feedback, Compensatory strategies, and Patient/family education  Alphonzo Grieve, SLP Graduate Clinician    Happi B. Rutherford Nail, M.S., CCC-SLP, Mining engineer Certified Brain Injury [place]  [place] Office [phone] Ascom [phone] Fax [phone]

## 2022-08-31 ENCOUNTER — Ambulatory Visit: Payer: Medicare HMO | Admitting: Speech Pathology

## 2022-09-04 DIAGNOSIS — N35812 Other urethral bulbous stricture, male: Secondary | ICD-10-CM | POA: Diagnosis not present

## 2022-09-04 DIAGNOSIS — G4733 Obstructive sleep apnea (adult) (pediatric): Secondary | ICD-10-CM | POA: Diagnosis not present

## 2022-09-04 DIAGNOSIS — N35014 Post-traumatic urethral stricture, male, unspecified: Secondary | ICD-10-CM | POA: Diagnosis not present

## 2022-09-04 DIAGNOSIS — I10 Essential (primary) hypertension: Secondary | ICD-10-CM | POA: Diagnosis not present

## 2022-09-04 DIAGNOSIS — E119 Type 2 diabetes mellitus without complications: Secondary | ICD-10-CM | POA: Diagnosis not present

## 2022-09-04 DIAGNOSIS — Z794 Long term (current) use of insulin: Secondary | ICD-10-CM | POA: Diagnosis not present

## 2022-09-04 DIAGNOSIS — Z9989 Dependence on other enabling machines and devices: Secondary | ICD-10-CM | POA: Diagnosis not present

## 2022-09-04 DIAGNOSIS — N35919 Unspecified urethral stricture, male, unspecified site: Secondary | ICD-10-CM | POA: Diagnosis not present

## 2022-09-04 DIAGNOSIS — Z8673 Personal history of transient ischemic attack (TIA), and cerebral infarction without residual deficits: Secondary | ICD-10-CM | POA: Diagnosis not present

## 2022-09-04 DIAGNOSIS — K219 Gastro-esophageal reflux disease without esophagitis: Secondary | ICD-10-CM | POA: Diagnosis not present

## 2022-09-04 DIAGNOSIS — I2510 Atherosclerotic heart disease of native coronary artery without angina pectoris: Secondary | ICD-10-CM | POA: Diagnosis not present

## 2022-09-05 ENCOUNTER — Ambulatory Visit: Payer: Medicare HMO | Admitting: Speech Pathology

## 2022-09-07 ENCOUNTER — Ambulatory Visit: Payer: Medicare HMO | Admitting: Speech Pathology

## 2022-09-12 ENCOUNTER — Ambulatory Visit: Payer: Medicare HMO | Admitting: Speech Pathology

## 2022-09-12 DIAGNOSIS — N35010 Post-traumatic urethral stricture, male, meatal: Secondary | ICD-10-CM | POA: Diagnosis not present

## 2022-09-14 ENCOUNTER — Ambulatory Visit: Payer: Medicare HMO | Admitting: Speech Pathology

## 2022-09-14 DIAGNOSIS — E782 Mixed hyperlipidemia: Secondary | ICD-10-CM | POA: Diagnosis not present

## 2022-09-14 DIAGNOSIS — I110 Hypertensive heart disease with heart failure: Secondary | ICD-10-CM | POA: Diagnosis not present

## 2022-09-14 DIAGNOSIS — E1151 Type 2 diabetes mellitus with diabetic peripheral angiopathy without gangrene: Secondary | ICD-10-CM | POA: Diagnosis not present

## 2022-09-14 DIAGNOSIS — I2510 Atherosclerotic heart disease of native coronary artery without angina pectoris: Secondary | ICD-10-CM | POA: Diagnosis not present

## 2022-09-14 DIAGNOSIS — I70209 Unspecified atherosclerosis of native arteries of extremities, unspecified extremity: Secondary | ICD-10-CM | POA: Diagnosis not present

## 2022-09-14 DIAGNOSIS — Z79899 Other long term (current) drug therapy: Secondary | ICD-10-CM | POA: Diagnosis not present

## 2022-09-14 DIAGNOSIS — F418 Other specified anxiety disorders: Secondary | ICD-10-CM | POA: Diagnosis not present

## 2022-09-14 DIAGNOSIS — E1159 Type 2 diabetes mellitus with other circulatory complications: Secondary | ICD-10-CM | POA: Diagnosis not present

## 2022-09-14 DIAGNOSIS — J449 Chronic obstructive pulmonary disease, unspecified: Secondary | ICD-10-CM | POA: Diagnosis not present

## 2022-09-14 DIAGNOSIS — E039 Hypothyroidism, unspecified: Secondary | ICD-10-CM | POA: Diagnosis not present

## 2022-09-19 ENCOUNTER — Ambulatory Visit: Payer: Medicare HMO | Attending: Neurology | Admitting: Speech Pathology

## 2022-09-19 DIAGNOSIS — R4701 Aphasia: Secondary | ICD-10-CM | POA: Diagnosis not present

## 2022-09-19 DIAGNOSIS — I63512 Cerebral infarction due to unspecified occlusion or stenosis of left middle cerebral artery: Secondary | ICD-10-CM | POA: Insufficient documentation

## 2022-09-19 NOTE — Therapy (Addendum)
OUTPATIENT SPEECH LANGUAGE PATHOLOGY TREATMENT  PROGRESS NOTE   Patient Name: Paul Bass MRN: [id number] DOB:[birth year], 77 y.o., male Today's Date: 09/19/2022   PCP: [name], NP REFERRING PROVIDER: Jennings Books, MD Speech Therapy Progress Note  Dates of Reporting Period: 07/07/2022 to 09/19/2022  Objective: Patient has been seen for 10 speech therapy sessions this reporting period targeting aphasia. Over the course of the last 10 sessions pt has made slower than anticipated progress towards both short and long term goals. Suspect lack of progress is related to missing 3 weeks due to urology procedure.   End of Session - 09/19/22 1357     Visit Number 30    Number of Visits 50    Date for SLP Re-Evaluation 11/07/22    Authorization Type [employer] HMO    Authorization Time Period 06/04/2023 thru 11/07/2022    Authorization - Visit Number 13    Authorization - Number of Visits 36    Progress Note Due on Visit 70    SLP Start Time 0900    SLP Stop Time  1000    SLP Time Calculation (min) 60 min    Activity Tolerance Patient tolerated treatment well             Past Medical History:  Diagnosis Date   Accelerated junctional rhythm    Anemia    Anxiety    a.) Tx'd with BZO PRN   Arthritis    Bilateral carotid artery disease ([name])    BPH (benign prostatic hypertrophy)    CAD S/P percutaneous coronary angioplasty    a.) PCI in 2002 placing a RCA stent (unknown type). b.) LHC 12/07/2015: 5% ISR m-dRCA, 90% dRCA, 25% pRCA, 25% p-mLCx, 100% OM3, 95% oOM2-OM2, 75% m-dLAD; refer to CVTS. c.) 3v CABG 01/03/2016   CHF (congestive heart failure) ([place])    a.) TTE 05/12/2012: EF 50-55%; mild LA enlargement; G1DD. b.)  TTE 11/26/2015: EF 45%; mild BAE; triv PR, mild MR/TR; inferolateral HK; G1DD. c.)  TTE 11/07/2017: EF 35%; RV enlargement; BAE; inferior and inferolateral HK; triv PR; mild MR/TR. d.)  TTE 09/17/2019: EF 35%; mild LVH; triv MR/TR. e.)  TTE  08/11/2021: EF 50%; triv MR/TR; G1DD.   Cognitive deficit following cerebrovascular accident (CVA)    COPD (chronic obstructive pulmonary disease) ([name])    Cortical cataract    Depression    Dyspnea    GERD (gastroesophageal reflux disease)    History of 2019 novel coronavirus disease (COVID-19) 01/19/2021   History of acute inferior wall MI 2002   a.) PCI was performed placing a RCA stent (unknown type)   History of kidney stones    HTN (hypertension)    Hyperlipidemia    Hypothyroidism    Long term current use of antithrombotics/antiplatelets    a.) DAPT therapy (ASA + clopidogrel)   OSA (obstructive sleep apnea)    a.) does not require nocturnal PAP therapy   PLMD (periodic limb movement disorder)    Postoperative atrial fibrillation ([place]) 01/03/2016   a.) following CABG procedure   Prostatitis    PVD (peripheral vascular disease) ([place])    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 b/) residual RIGHT sided weakness   T2DM (type 2 diabetes mellitus) ([place])  Tobacco abuse    Past Surgical History:  Procedure Laterality Date   CARDIAC CATHETERIZATION N/A 12/07/2015   Procedure: Left Heart Cath and Coronary Angiography;  Surgeon: Corey Skains, MD;  Location: [place] [facility];  Service: Cardiovascular;  Laterality: N/A;   CAROTID ANGIOGRAPHY N/A 06/05/2022   Procedure: CAROTID ANGIOGRAPHY;  Surgeon: Algernon Huxley, MD;  Location: [place] [facility];  Service: Cardiovascular;  Laterality: N/A;   CAROTID PTA/STENT INTERVENTION Left    HX: 2 stents   CATARACT EXTRACTION W/PHACO Left 03/24/2021   Procedure: CATARACT EXTRACTION PHACO AND INTRAOCULAR LENS PLACEMENT (IOC) LEFT DIABETIC 6.67 00:42.5;  Surgeon: Birder Robson, MD;  Location: [facility]  ORS;  Service: Ophthalmology;  Laterality: Left;   COLONOSCOPY WITH PROPOFOL N/A 02/18/2019   Procedure: COLONOSCOPY WITH PROPOFOL;  Surgeon: Lucilla Lame, MD;  Location: [facility] ENDOSCOPY;  Service: Endoscopy;  Laterality: N/A;   COLONOSCOPY WITH PROPOFOL N/A 12/22/2021   Procedure: COLONOSCOPY WITH PROPOFOL;  Surgeon: Lucilla Lame, MD;  Location: [facility] ENDOSCOPY;  Service: Endoscopy;  Laterality: N/A;   CORONARY ANGIOPLASTY WITH STENT PLACEMENT Left 2002   CORONARY ARTERY BYPASS GRAFT N/A 01/03/2016   Procedure: 3v CORONARY ARTERY BYPASS GRAFT (LIMA-LAD, SVG-PDA, SVG-OM2); Location: [facility]; Surgeon: Dwaine Deter, MD   CYSTOSCOPY W/ URETERAL STENT REMOVAL N/A 10/04/2021   Procedure: CYSTOSCOPY WITH PROSTATE FOREIGN BODY REMOVAL;  Surgeon: Abbie Sons, MD;  Location: [facility] ORS;  Service: Urology;  Laterality: N/A;   CYSTOSCOPY WITH INSERTION OF UROLIFT N/A 04/29/2019   Procedure: CYSTOSCOPY WITH INSERTION OF UROLIFT;  Surgeon: Abbie Sons, MD;  Location: [facility] ORS;  Service: Urology;  Laterality: N/A;   ESOPHAGOGASTRODUODENOSCOPY (EGD) WITH PROPOFOL N/A 09/11/2016   Procedure: ESOPHAGOGASTRODUODENOSCOPY (EGD) WITH PROPOFOL;  Surgeon: Lucilla Lame, MD;  Location: [facility] ENDOSCOPY;  Service: Endoscopy;  Laterality: N/A;   ESOPHAGOGASTRODUODENOSCOPY (EGD) WITH PROPOFOL N/A 10/17/2016   Procedure: ESOPHAGOGASTRODUODENOSCOPY (EGD) WITH PROPOFOL;  Surgeon: Lucilla Lame, MD;  Location: [facility] ENDOSCOPY;  Service: Endoscopy;  Laterality: N/A;   ESOPHAGOGASTRODUODENOSCOPY (EGD) WITH PROPOFOL N/A 11/13/2017   Procedure: ESOPHAGOGASTRODUODENOSCOPY (EGD) WITH PROPOFOL;  Surgeon: Lucilla Lame, MD;  Location: [facility] ENDOSCOPY;  Service: Endoscopy;  Laterality: N/A;   pins R lower leg Left    rotator cuff replaced Right    SAVORY DILATION N/A 10/17/2016   Procedure: SAVORY DILATION;  Surgeon: Lucilla Lame, MD;  Location: [facility] ENDOSCOPY;  Service: Endoscopy;  Laterality: N/A;   TRANSURETHRAL INCISION OF PROSTATE  N/A 10/04/2021   Procedure: TRANSURETHRAL INCISION OF THE PROSTATE (TUIP);  Surgeon: Abbie Sons, MD;  Location: [facility] ORS;  Service: Urology;  Laterality: N/A;   Patient Active Problem List   Diagnosis Date Noted   Carotid stenosis, symptomatic, with infarction ([place]) 06/05/2022   History of colonic polyps    Anxiety state 11/17/2021   Cognitive deficit, post-stroke 11/17/2021   Ex-smoker 11/17/2021   Bradycardia 99991111   Chronic systolic CHF (congestive heart failure), NYHA class 2 ([place]) 05/27/2020   Accelerated junctional rhythm 04/21/2019   Encounter for screening colonoscopy    Polyp of colon    Atherosclerotic heart disease of native coronary artery without angina pectoris 01/14/2019   Depression 01/14/2019   GERD (gastroesophageal reflux disease) 01/14/2019   Periodic limb movement disorder (PLMD) 01/14/2019   Sleep apnea 01/14/2019   Type 2 diabetes mellitus without complications ([place]) 99991111   Stricture and stenosis of esophagus    Problems with swallowing and mastication    Food impaction of esophagus    H/O coronary artery bypass  surgery 01/17/2016   Cerebrovascular accident (CVA) due to embolism ([place]) 01/03/2016   Hypothyroidism, unspecified 01/03/2016   HLD (hyperlipidemia) 01/03/2016   Stable angina 11/29/2015   Benign essential hypertension 05/27/2015   Bilateral carotid artery stenosis 02/24/2015   Atherosclerotic peripheral vascular disease ([place]) 09/07/2014   Bladder calculus 08/12/2014   Primary osteoarthritis of left knee 03/10/2014   Elevated prostate specific antigen (PSA) 02/05/2013   Snoring 01/22/2013   Sleepiness 01/22/2013   Dysphagia, unspecified(787.20) 01/22/2013   Occlusion and stenosis of carotid artery without mention of cerebral infarction 01/22/2013   Cerebral thrombosis with cerebral infarction ([place]) 01/22/2013   Benign prostatic hyperplasia with incomplete bladder emptying 08/26/2012   Chronic prostatitis 08/26/2012   Family  history of malignant neoplasm of prostate 08/26/2012   Incomplete emptying of bladder 08/26/2012   Dysphagia 05/14/2012   Global aphasia 05/14/2012   Stroke, acute, thrombotic ([place]) 05/12/2012   Symptomatic carotid artery stenosis 05/12/2012   HTN (hypertension) 05/12/2012   COPD (chronic obstructive pulmonary disease) ([place]) 05/12/2012   Acute respiratory failure ([place]) 05/12/2012    ONSET DATE: 05/13/2012  REFERRING DIAG: [id number] (ICD-10-CM) - Cognitive deficit, post-stroke   PERTINENT HISTORY: Pt is a 77 year old male with past medical hx of anxiety, COP, depression, diabetes mellitus without complication, HTN, hyperlipidemia.    Of note, pt received Outpatient ST services for a total of 28 sessions ending on 03/24/2020. At the end of that POC pt required moderate assistance.      DIAGNOSTIC FINDINGS:  MRI 10/03/2019  No acute intracranial abnormality. 2. Moderate-sized chronic left MCA infarct. 3. Mild chronic small vessel ischemic disease, mildly progressed from 2014. 4. Progressive cerebral atrophy.  THERAPY DIAG:  Aphasia  Rationale for Evaluation and Treatment Rehabilitation  SUBJECTIVE: Pt presented in a pleasant mood, stated he was glad to be back after missing a few weeks.   Pt accompanied by: self  PAIN:  Are you having pain? No  PATIENT GOALS: he would like to be able to communicate better   OBJECTIVE:   TODAY'S TREATMENT: Skilled treatment session targeted pt's word finding and communication breakdown goals. SLP facilitated session by providing the following interventions:  During treatment, SLP facilitated conversation utilizing the patients speech generating device. During session, patient switched between verbal speech and his device when needing assistance with word finding difficulties. SLP added new icons to pt's device regarding prescription medications.   SLP facilitated a role playing scenario targeting activities pt frequents in the community. Pt  utilized his device with 60% accuracy, requiring moderate cues to locate icons. Pt benefited from fill in the blank prompts and reorganization of icons to be in alphabetical order.   PATIENT EDUCATION: Education details: see above Person educated: Patient and wife (via text) Education method: Explanation and Handouts Education comprehension: verbalized understanding  HOME EXERCISE PROGRAM:   Written homework provided for pt's wife pt questions based on [place] to help pt locate icons   GOALS: Goals reviewed with patient? Yes   SHORT TERM GOALS: Target date: 10 sessions UPDATED: 05/16/2022 UPDATED: 07/07/2022 Pt will learn strategies (such as drawing, gestures) to aid during communication breakdowns.         Baseline: only using circumlocutions 05/16/2022  Goal status: DISCONTINUED, pt unable d/t motor deficits   2.  With minimal assistance, pt will use strategies to help during communication breakdowns.  Baseline: new goal 05/16/2022  Goal status: DISCONTINUED, no viable strategies identified as successful  3.  Pt will respond to general questions using AAC/nonverbal  communication effectively with 50% accuracy and mod cues.  Baseline: 25% Goal Status: INITIAL 07/07/2022 Goal Status: MET Updated Goal: Pt will respond to general questions using AAC/nonverbal communication effectively with 75% and Min cues.  08/15/2022 Goal Status: ONGOING  4.  Pt will use AAC to communicate within group situations with 50% accuracy given Mod to Min cues.   Baseline: 15% Goal Status: INITIAL 07/07/2022 Goal Status: MET Updated Goal: Pt will use AAC to communication within group situations with 75% accuracy given Min A cues.  08/15/2022 Goal Status: ONGOING  5.   Pt will use AAC to communicate in a structured conversation setting with 75% accuracy and min cues.  Baseline: 25% Goal Status: MET 07/07/2022 Goal Status: MET Updated Goal: Pt will use AAC to communicate in a structured  conversation setting with 80% accuracy and intermittent cues.  08/15/2022 Goal Status: ONGOING   6. Given a structured role play scenario targeting community based activities, the pt will utilize his device in 5/7 opportunities given min/mod cues.   Goal Status: Initial   09/19/2022   LONG TERM GOALS: Target date: 11/07/2022  UPDATED: 05/16/2022  UPDATED: 08/15/2022 Pt will utilize strategies to aid in communication breakdowns with Mod I.  Baseline: only using circumlocutions Goal status: DISCONTINUED, no viable strategies identified as successful  2.  To maximize functional communication across all communication contexts using AAC and multimodal means of communication. Baseline: 25% Goal status: ONGOING      ASSESSMENT:  CLINICAL IMPRESSION: Recommend further practice using device at home and in social situations, spending more time in each category of icons to further familiarize the patient where to locate information. Pt would benefit from continued use of device during conversations, as he currently needs moderate assistance to locate icons when asked questions.    OBJECTIVE IMPAIRMENTS include expressive language and aphasia. These impairments are limiting patient from effectively communicating at home and in community. Factors affecting potential to achieve goals and functional outcome are  time post onset (10 years) and previous ST intervention . Patient will benefit from skilled SLP services to address above impairments and improve overall function.  REHAB POTENTIAL: Fair time post onset (10 years)  PLAN: SLP FREQUENCY: 1-2x/week  SLP DURATION: 8 weeks  PLANNED INTERVENTIONS: Language facilitation, Internal/external aids, SLP instruction and feedback, Compensatory strategies, and Patient/family education  Alphonzo Grieve, SLP Graduate Clinician    Happi B. Rutherford Nail, M.S., CCC-SLP, Mining engineer Certified Brain Injury [employer]   [place] Office [phone] Ascom [phone] Fax [phone]

## 2022-09-21 ENCOUNTER — Ambulatory Visit: Payer: Medicare HMO | Admitting: Speech Pathology

## 2022-09-21 DIAGNOSIS — I63512 Cerebral infarction due to unspecified occlusion or stenosis of left middle cerebral artery: Secondary | ICD-10-CM | POA: Diagnosis not present

## 2022-09-21 DIAGNOSIS — R4701 Aphasia: Secondary | ICD-10-CM

## 2022-09-21 NOTE — Therapy (Signed)
OUTPATIENT SPEECH LANGUAGE PATHOLOGY TREATMENT     Patient Name: Paul Bass MRN: [id number] DOB:1946-04-04, 77 y.o., male Today's Date: 09/21/2022   PCP: [name], NP REFERRING PROVIDER: Jennings Books, MD   End of Session - 09/21/22 0955     Visit Number 31    Number of Visits 71    Date for SLP Re-Evaluation 11/07/22    Authorization Type [employer] H[employer]    Authorization Time Period 06/04/2023 thru 11/07/2022    Authorization - Visit Number 34    Authorization - Number of Visits 36    Progress Note Due on Visit 74    SLP Start Time 0900    SLP Stop Time  1000    SLP Time Calculation (min) 60 min    Activity Tolerance Patient tolerated treatment well             Past Medical History:  Diagnosis Date   Accelerated junctional rhythm    Anemia    Anxiety    a.) Tx'd with BZO PRN   Arthritis    Bilateral carotid artery disease ([name])    BPH (benign prostatic hypertrophy)    CAD S/P percutaneous coronary angioplasty    a.) PCI in 2002 placing a RCA stent (unknown type). b.) LHC 12/07/2015: 5% ISR m-dRCA, 90% dRCA, 25% pRCA, 25% p-mLCx, 100% OM3, 95% oOM2-OM2, 75% m-dLAD; refer to CVTS. c.) 3v CABG 01/03/2016   CHF (congestive heart failure) ([place])    a.) TTE 05/12/2012: EF 50-55%; mild LA enlargement; G1DD. b.)  TTE 11/26/2015: EF 45%; mild BAE; triv PR, mild MR/TR; inferolateral HK; G1DD. c.)  TTE 11/07/2017: EF 35%; RV enlargement; BAE; inferior and inferolateral HK; triv PR; mild MR/TR. d.)  TTE 09/17/2019: EF 35%; mild LVH; triv MR/TR. e.)  TTE 08/11/2021: EF 50%; triv MR/TR; G1DD.   Cognitive deficit following cerebrovascular accident (CVA)    COPD (chronic obstructive pulmonary disease) ([name])    Cortical cataract    Depression    Dyspnea    GERD (gastroesophageal reflux disease)    History of 2019 novel coronavirus disease (COVID-19) 01/19/2021   History of acute inferior wall MI 2002   a.) PCI was performed placing a RCA stent (unknown type)    History of kidney stones    HTN (hypertension)    Hyperlipidemia    Hypothyroidism    Long term current use of antithrombotics/antiplatelets    a.) DAPT therapy (ASA + clopidogrel)   OSA (obstructive sleep apnea)    a.) does not require nocturnal PAP therapy   PLMD (periodic limb movement disorder)    Postoperative atrial fibrillation ([place]) 01/03/2016   a.) following CABG procedure   Prostatitis    PVD (peripheral vascular disease) ([place])    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 b/) residual RIGHT sided weakness   T2DM (type 2 diabetes mellitus) ([place])    Tobacco abuse    Past Surgical History:  Procedure Laterality Date   CARDIAC CATHETERIZATION N/A 12/07/2015   Procedure: Left Heart Cath and Coronary Angiography;  Surgeon: Corey Skains, MD;  Location: [place] [facility];  Service: Cardiovascular;  Laterality: N/A;   CAROTID ANGIOGRAPHY N/A 06/05/2022   Procedure: CAROTID ANGIOGRAPHY;  Surgeon: Algernon Huxley, MD;  Location: [place] [facility];  Service: Cardiovascular;  Laterality: N/A;   CAROTID PTA/STENT INTERVENTION Left    HX: 2 stents   CATARACT EXTRACTION W/PHACO Left 03/24/2021   Procedure: CATARACT EXTRACTION PHACO AND INTRAOCULAR LENS PLACEMENT (IOC) LEFT DIABETIC 6.67 00:42.5;  Surgeon: Birder Robson, MD;  Location: [facility];  Service: Ophthalmology;  Laterality: Left;   COLONOSCOPY WITH PROPOFOL N/A 02/18/2019   Procedure: COLONOSCOPY WITH PROPOFOL;  Surgeon: Lucilla Lame, MD;  Location: [facility];  Service: Endoscopy;  Laterality: N/A;   COLONOSCOPY WITH PROPOFOL N/A 12/22/2021   Procedure: COLONOSCOPY WITH PROPOFOL;  Surgeon: Lucilla Lame, MD;  Location: [facility] ENDOSCOPY;  Service: Endoscopy;  Laterality: N/A;   CORONARY  ANGIOPLASTY WITH STENT PLACEMENT Left 2002   CORONARY ARTERY BYPASS GRAFT N/A 01/03/2016   Procedure: 3v CORONARY ARTERY BYPASS GRAFT (LIMA-LAD, SVG-PDA, SVG-OM2); Location: [facility]; Surgeon: Dwaine Deter, MD   CYSTOSCOPY W/ URETERAL STENT REMOVAL N/A 10/04/2021   Procedure: CYSTOSCOPY WITH PROSTATE FOREIGN BODY REMOVAL;  Surgeon: Abbie Sons, MD;  Location: [facility];  Service: Urology;  Laterality: N/A;   CYSTOSCOPY WITH INSERTION OF UROLIFT N/A 04/29/2019   Procedure: CYSTOSCOPY WITH INSERTION OF UROLIFT;  Surgeon: Abbie Sons, MD;  Location: [facility];  Service: Urology;  Laterality: N/A;   ESOPHAGOGASTRODUODENOSCOPY (EGD) WITH PROPOFOL N/A 09/11/2016   Procedure: ESOPHAGOGASTRODUODENOSCOPY (EGD) WITH PROPOFOL;  Surgeon: Lucilla Lame, MD;  Location: [facility];  Service: Endoscopy;  Laterality: N/A;   ESOPHAGOGASTRODUODENOSCOPY (EGD) WITH PROPOFOL N/A 10/17/2016   Procedure: ESOPHAGOGASTRODUODENOSCOPY (EGD) WITH PROPOFOL;  Surgeon: Lucilla Lame, MD;  Location: [facility];  Service: Endoscopy;  Laterality: N/A;   ESOPHAGOGASTRODUODENOSCOPY (EGD) WITH PROPOFOL N/A 11/13/2017   Procedure: ESOPHAGOGASTRODUODENOSCOPY (EGD) WITH PROPOFOL;  Surgeon: Lucilla Lame, MD;  Location: [facility];  Service: Endoscopy;  Laterality: N/A;   pins R lower leg Left    rotator cuff replaced Right    SAVORY DILATION N/A 10/17/2016   Procedure: SAVORY DILATION;  Surgeon: Lucilla Lame, MD;  Location: [facility];  Service: Endoscopy;  Laterality: N/A;   TRANSURETHRAL INCISION OF PROSTATE N/A 10/04/2021   Procedure: TRANSURETHRAL INCISION OF THE PROSTATE (TUIP);  Surgeon: Abbie Sons, MD;  Location: [facility];  Service: Urology;  Laterality: N/A;   Patient Active Problem List   Diagnosis Date Noted   Carotid stenosis, symptomatic, with infarction ([place]) 06/05/2022   History of colonic polyps    Anxiety state 11/17/2021   Cognitive deficit, post-stroke 11/17/2021   Ex-smoker 11/17/2021    Bradycardia 99991111   Chronic systolic CHF (congestive heart failure), NYHA class 2 ([place]) 05/27/2020   Accelerated junctional rhythm 04/21/2019   Encounter for screening colonoscopy    Polyp of colon    Atherosclerotic heart disease of native coronary artery without angina pectoris 01/14/2019   Depression 01/14/2019   GERD (gastroesophageal reflux disease) 01/14/2019   Periodic limb movement disorder (PLMD) 01/14/2019   Sleep apnea 01/14/2019   Type 2 diabetes mellitus without complications ([place]) 99991111   Stricture and stenosis of esophagus    Problems with swallowing and mastication    Food impaction of esophagus    H/O coronary artery bypass surgery 01/17/2016   Cerebrovascular accident (CVA) due to embolism ([place]) 01/03/2016   Hypothyroidism, unspecified 01/03/2016   HLD (hyperlipidemia) 01/03/2016   Stable angina 11/29/2015   Benign essential hypertension 05/27/2015   Bilateral carotid artery stenosis 02/24/2015   Atherosclerotic peripheral vascular disease ([place]) 09/07/2014   Bladder calculus 08/12/2014   Primary osteoarthritis of left knee 03/10/2014  Elevated prostate specific antigen (PSA) 02/05/2013   Snoring 01/22/2013   Sleepiness 01/22/2013   Dysphagia, unspecified(787.20) 01/22/2013   Occlusion and stenosis of carotid artery without mention of cerebral infarction 01/22/2013   Cerebral thrombosis with cerebral infarction ([place]) 01/22/2013   Benign prostatic hyperplasia with incomplete bladder emptying 08/26/2012   Chronic prostatitis 08/26/2012   Family history of malignant neoplasm of prostate 08/26/2012   Incomplete emptying of bladder 08/26/2012   Dysphagia 05/14/2012   Global aphasia 05/14/2012   Stroke, acute, thrombotic ([place]) 05/12/2012   Symptomatic carotid artery stenosis 05/12/2012   HTN (hypertension) 05/12/2012   COPD (chronic obstructive pulmonary disease) ([place]) 05/12/2012   Acute respiratory failure ([place]) 05/12/2012    ONSET DATE:  05/13/2012  REFERRING DIAG: [id number] (ICD-10-CM) - Cognitive deficit, post-stroke   PERTINENT HISTORY: Pt is a 77 year old male with past medical hx of anxiety, COP, depression, diabetes mellitus without complication, HTN, hyperlipidemia.    Of note, pt received Outpatient ST services for a total of 28 sessions ending on 03/24/2020. At the end of that POC pt required moderate assistance.      DIAGNOSTIC FINDINGS:  MRI 10/03/2019  No acute intracranial abnormality. 2. Moderate-sized chronic left MCA infarct. 3. Mild chronic small vessel ischemic disease, mildly progressed from 2014. 4. Progressive cerebral atrophy.  THERAPY DIAG:  Aphasia  Rationale for Evaluation and Treatment Rehabilitation  SUBJECTIVE: Pt presented in a pleasant mood, stated his wife has come down with the flu.   Pt accompanied by: self  PAIN:  Are you having pain? No  PATIENT GOALS: he would like to be able to communicate better   OBJECTIVE:   TODAY'S TREATMENT: Skilled treatment session targeted pt's word finding and communication breakdown goals. SLP facilitated session by providing the following interventions:  During treatment, SLP facilitated conversation utilizing the patients speech generating device. During session, patient switched between verbal speech and his device when needing assistance with word finding difficulties. During conversation, pt utilized his deice with 50% accuracy, requiring multimodal assistance.  SLP facilitated a role playing scenario targeting activities pt frequents in the community. Pt utilized his device with 88% accuracy, requiring minimal cues to locate icons. Increase in accuracy believed to be from patients report of practice with questions at home.  Pt benefited from fill in the blank prompts.   PATIENT EDUCATION: Education details: see above Person educated: Patient and wife (via text) Education method: Explanation and Handouts Education comprehension: verbalized  understanding  HOME EXERCISE PROGRAM:   Written homework provided for pt's wife pt questions based on [place] to help pt locate icons.   Instructed pt to take device with him to restaurants to aid in ordering food.    GOALS: Goals reviewed with patient? Yes   SHORT TERM GOALS: Target date: 10 sessions UPDATED: 05/16/2022 UPDATED: 07/07/2022 Pt will learn strategies (such as drawing, gestures) to aid during communication breakdowns.         Baseline: only using circumlocutions 05/16/2022  Goal status: DISCONTINUED, pt unable d/t motor deficits   2.  With minimal assistance, pt will use strategies to help during communication breakdowns.  Baseline: new goal 05/16/2022  Goal status: DISCONTINUED, no viable strategies identified as successful  3.  Pt will respond to general questions using AAC/nonverbal communication effectively with 50% accuracy and mod cues.  Baseline: 25% Goal Status: INITIAL 07/07/2022 Goal Status: MET Updated Goal: Pt will respond to general questions using AAC/nonverbal communication effectively with 75% and Min cues.  08/15/2022 Goal Status: ONGOING  4.  Pt will use AAC to communicate within group situations with 50% accuracy given Mod to Min cues.   Baseline: 15% Goal Status: INITIAL 07/07/2022 Goal Status: MET Updated Goal: Pt will use AAC to communication within group situations with 75% accuracy given Min A cues.  08/15/2022 Goal Status: ONGOING  5.   Pt will use AAC to communicate in a structured conversation setting with 75% accuracy and min cues.  Baseline: 25% Goal Status: MET 07/07/2022 Goal Status: MET Updated Goal: Pt will use AAC to communicate in a structured conversation setting with 80% accuracy and intermittent cues.  08/15/2022 Goal Status: ONGOING   6. Given a structured role play scenario targeting community based activities, the pt will utilize his device in 5/7 opportunities given min/mod cues.   Goal Status: Initial    09/19/2022   LONG TERM GOALS: Target date: 11/07/2022  UPDATED: 05/16/2022  UPDATED: 08/15/2022 Pt will utilize strategies to aid in communication breakdowns with Mod I.  Baseline: only using circumlocutions Goal status: DISCONTINUED, no viable strategies identified as successful  2.  To maximize functional communication across all communication contexts using AAC and multimodal means of communication. Baseline: 25% Goal status: ONGOING      ASSESSMENT:  CLINICAL IMPRESSION: Recommend further practice using device at home and in social situations, spending more time in each category of icons to further familiarize the patient where to locate information. Pt would benefit from continued use of device during conversations, as he currently needs moderate assistance to locate icons when asked questions.    OBJECTIVE IMPAIRMENTS include expressive language and aphasia. These impairments are limiting patient from effectively communicating at home and in community. Factors affecting potential to achieve goals and functional outcome are  time post onset (10 years) and previous ST intervention . Patient will benefit from skilled SLP services to address above impairments and improve overall function.  REHAB POTENTIAL: Fair time post onset (10 years)  PLAN: SLP FREQUENCY: 1-2x/week  SLP DURATION: 8 weeks  PLANNED INTERVENTIONS: Language facilitation, Internal/external aids, SLP instruction and feedback, Compensatory strategies, and Patient/family education  Alphonzo Grieve, SLP Graduate Clinician    Happi B. Rutherford Nail, M.S., CCC-SLP, Mining engineer Certified Brain Injury [place]  [place] Office [phone] Ascom [phone] Fax [phone]

## 2022-09-22 DIAGNOSIS — Z03818 Encounter for observation for suspected exposure to other biological agents ruled out: Secondary | ICD-10-CM | POA: Diagnosis not present

## 2022-09-22 DIAGNOSIS — R4182 Altered mental status, unspecified: Secondary | ICD-10-CM | POA: Diagnosis not present

## 2022-09-22 DIAGNOSIS — R5383 Other fatigue: Secondary | ICD-10-CM | POA: Diagnosis not present

## 2022-09-22 DIAGNOSIS — R399 Unspecified symptoms and signs involving the genitourinary system: Secondary | ICD-10-CM | POA: Diagnosis not present

## 2022-09-22 DIAGNOSIS — J101 Influenza due to other identified influenza virus with other respiratory manifestations: Secondary | ICD-10-CM | POA: Diagnosis not present

## 2022-09-22 DIAGNOSIS — R739 Hyperglycemia, unspecified: Secondary | ICD-10-CM | POA: Diagnosis not present

## 2022-09-22 DIAGNOSIS — R0989 Other specified symptoms and signs involving the circulatory and respiratory systems: Secondary | ICD-10-CM | POA: Diagnosis not present

## 2022-09-25 ENCOUNTER — Other Ambulatory Visit (INDEPENDENT_AMBULATORY_CARE_PROVIDER_SITE_OTHER): Payer: Self-pay | Admitting: Nurse Practitioner

## 2022-09-25 DIAGNOSIS — I6523 Occlusion and stenosis of bilateral carotid arteries: Secondary | ICD-10-CM

## 2022-09-26 ENCOUNTER — Ambulatory Visit: Payer: Medicare HMO | Admitting: Speech Pathology

## 2022-09-28 ENCOUNTER — Ambulatory Visit: Payer: Medicare HMO | Admitting: Speech Pathology

## 2022-09-29 ENCOUNTER — Ambulatory Visit (INDEPENDENT_AMBULATORY_CARE_PROVIDER_SITE_OTHER): Payer: Medicare HMO | Admitting: Vascular Surgery

## 2022-09-29 ENCOUNTER — Encounter (INDEPENDENT_AMBULATORY_CARE_PROVIDER_SITE_OTHER): Payer: Self-pay | Admitting: Vascular Surgery

## 2022-09-29 ENCOUNTER — Ambulatory Visit (INDEPENDENT_AMBULATORY_CARE_PROVIDER_SITE_OTHER): Payer: Medicare HMO

## 2022-09-29 VITALS — BP 122/73 | HR 64 | Resp 18 | Ht 71.0 in | Wt 210.0 lb

## 2022-09-29 DIAGNOSIS — E119 Type 2 diabetes mellitus without complications: Secondary | ICD-10-CM | POA: Diagnosis not present

## 2022-09-29 DIAGNOSIS — I10 Essential (primary) hypertension: Secondary | ICD-10-CM | POA: Diagnosis not present

## 2022-09-29 DIAGNOSIS — I6523 Occlusion and stenosis of bilateral carotid arteries: Secondary | ICD-10-CM

## 2022-09-29 DIAGNOSIS — E785 Hyperlipidemia, unspecified: Secondary | ICD-10-CM

## 2022-09-29 NOTE — Assessment & Plan Note (Signed)
Duplex today shows velocities in the 1 to 39% range in both carotid arteries without significant recurrent stenosis after left carotid stent placement.  Doing well.  Continue current medical regimen including aspirin Plavix.  Recheck in 3 months with carotid duplex.

## 2022-09-29 NOTE — Progress Notes (Signed)
MRN : [id number]  Paul Bass is a 77 y.o. (1945-10-30) male who presents with chief complaint of  Chief Complaint  Patient presents with   Follow-up    d/u in 3 months with carotid  .  History of Present Illness: Patient returns in follow-up of his carotid disease.  He is about 4 to 5 months status post left carotid artery stent placement for high-grade recurrent stenosis with stent fracture of the previously placed carotid stent.  He is doing well.  His aphasia is stable and has not progressed since his mention.  He seems to be doing okay without new symptoms.  Duplex today shows velocities in the 1 to 39% range in both carotid arteries without significant recurrent stenosis after left carotid stent placement.  Current Outpatient Medications  Medication Sig Dispense Refill   albuterol (VENTOLIN HFA) 108 (90 Base) MCG/ACT inhaler SMARTSIG:2 inhalation Via Inhaler Every 4 Hours PRN     aspirin EC 81 MG tablet Take 1 tablet (81 mg total) by mouth daily at 6 (six) AM. Swallow whole. 30 tablet 12   atorvastatin (LIPITOR) 80 MG tablet Take 80 mg by mouth daily.      buPROPion (WELLBUTRIN XL) 150 MG 24 hr tablet Take 150 mg by mouth at bedtime.     cholecalciferol (VITAMIN D3) 25 MCG (1000 UT) tablet Take 1,000 Units by mouth every evening.     clonazePAM (KLONOPIN) 0.5 MG tablet Take 0.5 mg by mouth 2 (two) times daily.     clopidogrel (PLAVIX) 75 MG tablet Take 75 mg by mouth daily.   1   Coenzyme Q10 (COQ10) 100 MG CAPS Take 100 mg by mouth at bedtime.     Ferrous Sulfate (IRON) 325 (65 Fe) MG TABS Take 325 mg by mouth daily.     fluticasone-salmeterol (ADVAIR) 500-50 MCG/ACT AEPB Inhale into the lungs.     glucose blood test strip      levothyroxine (SYNTHROID, LEVOTHROID) 100 MCG tablet Take 100 mcg by mouth daily before breakfast.     metFORMIN (GLUCOPHAGE) 500 MG tablet Take 500 mg by mouth 2 (two) times daily.      metoprolol tartrate (LOPRESSOR) 25 MG tablet Take 25 mg by mouth 2  (two) times daily.     Multiple Vitamin (MULTIVITAMIN WITH MINERALS) TABS Take 1 tablet by mouth daily.     naproxen sodium (ALEVE) 220 MG tablet Take 220-440 mg by mouth 2 (two) times daily as needed (pain.).     oxyCODONE-acetaminophen (PERCOCET/ROXICET) 5-325 MG tablet Take 1 tablet by mouth every 6 (six) hours as needed for moderate pain. 20 tablet 0   pantoprazole (PROTONIX) 40 MG tablet Take 1 tablet (40 mg total) by mouth at bedtime. 90 tablet 3   psyllium (METAMUCIL SMOOTH TEXTURE) 28 % packet Take 1 packet by mouth daily.     RA KRILL OIL 500 MG CAPS Take 500 mg by mouth at bedtime.      tamsulosin (FLOMAX) 0.4 MG CAPS capsule TAKE 1 CAPSULE BY MOUTH EVERY DAY 90 capsule 1   venlafaxine XR (EFFEXOR-XR) 150 MG 24 hr capsule Take 150 mg by mouth daily.     No current facility-administered medications for this visit.    Past Medical History:  Diagnosis Date   Accelerated junctional rhythm    Anemia    Anxiety    a.) Tx'd with BZO PRN   Arthritis    Bilateral carotid artery disease ([name])    BPH (benign prostatic hypertrophy)  CAD S/P percutaneous coronary angioplasty    a.) PCI in 2002 placing a RCA stent (unknown type). b.) LHC 12/07/2015: 5% ISR m-dRCA, 90% dRCA, 25% pRCA, 25% p-mLCx, 100% OM3, 95% oOM2-OM2, 75% m-dLAD; refer to CVTS. c.) 3v CABG 01/03/2016   CHF (congestive heart failure) ([place])    a.) TTE 05/12/2012: EF 50-55%; mild LA enlargement; G1DD. b.)  TTE 11/26/2015: EF 45%; mild BAE; triv PR, mild MR/TR; inferolateral HK; G1DD. c.)  TTE 11/07/2017: EF 35%; RV enlargement; BAE; inferior and inferolateral HK; triv PR; mild MR/TR. d.)  TTE 09/17/2019: EF 35%; mild LVH; triv MR/TR. e.)  TTE 08/11/2021: EF 50%; triv MR/TR; G1DD.   Cognitive deficit following cerebrovascular accident (CVA)    COPD (chronic obstructive pulmonary disease) ([name])    Cortical cataract    Depression    Dyspnea    GERD (gastroesophageal reflux disease)    History of 2019 novel coronavirus  disease (COVID-19) 01/19/2021   History of acute inferior wall MI 2002   a.) PCI was performed placing a RCA stent (unknown type)   History of kidney stones    HTN (hypertension)    Hyperlipidemia    Hypothyroidism    Long term current use of antithrombotics/antiplatelets    a.) DAPT therapy (ASA + clopidogrel)   OSA (obstructive sleep apnea)    a.) does not require nocturnal PAP therapy   PLMD (periodic limb movement disorder)    Postoperative atrial fibrillation ([place]) 01/03/2016   a.) following CABG procedure   Prostatitis    PVD (peripheral vascular disease) ([place])    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 b/) residual RIGHT sided weakness   T2DM (type 2 diabetes mellitus) ([place])    Tobacco abuse     Past Surgical History:  Procedure Laterality Date   CARDIAC CATHETERIZATION N/A 12/07/2015   Procedure: Left Heart Cath and Coronary Angiography;  Surgeon: Corey Skains, MD;  Location: [place] [facility];  Service: Cardiovascular;  Laterality: N/A;   CAROTID ANGIOGRAPHY N/A 06/05/2022   Procedure: CAROTID ANGIOGRAPHY;  Surgeon: Algernon Huxley, MD;  Location: [place] [facility];  Service: Cardiovascular;  Laterality: N/A;   CAROTID PTA/STENT INTERVENTION Left    HX: 2 stents   CATARACT EXTRACTION W/PHACO Left 03/24/2021   Procedure: CATARACT EXTRACTION PHACO AND INTRAOCULAR LENS PLACEMENT (IOC) LEFT DIABETIC 6.67 00:42.5;  Surgeon: Birder Robson, MD;  Location: [facility];  Service: Ophthalmology;  Laterality: Left;   COLONOSCOPY WITH PROPOFOL N/A 02/18/2019   Procedure: COLONOSCOPY WITH PROPOFOL;  Surgeon: Lucilla Lame, MD;  Location: [facility];  Service: Endoscopy;  Laterality: N/A;   COLONOSCOPY WITH PROPOFOL N/A 12/22/2021    Procedure: COLONOSCOPY WITH PROPOFOL;  Surgeon: Lucilla Lame, MD;  Location: [facility];  Service: Endoscopy;  Laterality: N/A;   CORONARY ANGIOPLASTY WITH STENT PLACEMENT Left 2002   CORONARY ARTERY BYPASS GRAFT N/A 01/03/2016   Procedure: 3v CORONARY ARTERY BYPASS GRAFT (LIMA-LAD, SVG-PDA, SVG-OM2); Location: [facility]; Surgeon: Dwaine Deter, MD   CYSTOSCOPY W/ URETERAL STENT REMOVAL N/A 10/04/2021   Procedure: CYSTOSCOPY WITH PROSTATE FOREIGN BODY REMOVAL;  Surgeon: Abbie Sons, MD;  Location: [facility];  Service: Urology;  Laterality: N/A;   CYSTOSCOPY WITH INSERTION OF UROLIFT N/A 04/29/2019   Procedure: CYSTOSCOPY WITH INSERTION OF  UROLIFT;  Surgeon: Abbie Sons, MD;  Location: [facility];  Service: Urology;  Laterality: N/A;   ESOPHAGOGASTRODUODENOSCOPY (EGD) WITH PROPOFOL N/A 09/11/2016   Procedure: ESOPHAGOGASTRODUODENOSCOPY (EGD) WITH PROPOFOL;  Surgeon: Lucilla Lame, MD;  Location: [facility];  Service: Endoscopy;  Laterality: N/A;   ESOPHAGOGASTRODUODENOSCOPY (EGD) WITH PROPOFOL N/A 10/17/2016   Procedure: ESOPHAGOGASTRODUODENOSCOPY (EGD) WITH PROPOFOL;  Surgeon: Lucilla Lame, MD;  Location: [facility];  Service: Endoscopy;  Laterality: N/A;   ESOPHAGOGASTRODUODENOSCOPY (EGD) WITH PROPOFOL N/A 11/13/2017   Procedure: ESOPHAGOGASTRODUODENOSCOPY (EGD) WITH PROPOFOL;  Surgeon: Lucilla Lame, MD;  Location: [facility];  Service: Endoscopy;  Laterality: N/A;   pins R lower leg Left    rotator cuff replaced Right    SAVORY DILATION N/A 10/17/2016   Procedure: SAVORY DILATION;  Surgeon: Lucilla Lame, MD;  Location: [facility];  Service: Endoscopy;  Laterality: N/A;   TRANSURETHRAL INCISION OF PROSTATE N/A 10/04/2021   Procedure: TRANSURETHRAL INCISION OF THE PROSTATE (TUIP);  Surgeon: Abbie Sons, MD;  Location: [facility];  Service: Urology;  Laterality: N/A;     Social History   Tobacco Use   Smoking status: Former    Packs/day: 2.00    Types: Cigarettes     Quit date: 05/20/2012    Years since quitting: 10.3   Smokeless tobacco: Never  Vaping Use   Vaping Use: Never used  Substance Use Topics   Alcohol use: Not Currently    Comment: No alcohol since stroke   Drug use: Never      Family History  Problem Relation Age of Onset   Heart failure Father    Throat cancer Mother    Diabetes Brother      No Known Allergies   REVIEW OF SYSTEMS (Negative unless checked)   Constitutional: '[]'$ Weight loss  '[]'$ Fever  '[]'$ Chills Cardiac: '[]'$ Chest pain   '[]'$ Chest pressure   '[]'$ Palpitations   '[]'$ Shortness of breath when laying flat   '[]'$ Shortness of breath at rest   '[]'$ Shortness of breath with exertion. Vascular:  '[]'$ Pain in legs with walking   '[]'$ Pain in legs at rest   '[]'$ Pain in legs when laying flat   '[]'$ Claudication   '[]'$ Pain in feet when walking  '[]'$ Pain in feet at rest  '[]'$ Pain in feet when laying flat   '[]'$ History of DVT   '[]'$ Phlebitis   '[]'$ Swelling in legs   '[]'$ Varicose veins   '[]'$ Non-healing ulcers Pulmonary:   '[]'$ Uses home oxygen   '[]'$ Productive cough   '[]'$ Hemoptysis   '[]'$ Wheeze  '[x]'$ COPD   '[]'$ Asthma Neurologic:  '[]'$ Dizziness  '[]'$ Blackouts   '[]'$ Seizures   '[x]'$ History of stroke   '[]'$ History of TIA  '[]'$ Aphasia   '[]'$ Temporary blindness   '[]'$ Dysphagia   '[]'$ Weakness or numbness in arms   '[]'$ Weakness or numbness in legs Musculoskeletal:  '[x]'$ Arthritis   '[]'$ Joint swelling   '[]'$ Joint pain   '[]'$ Low back pain Hematologic:  '[]'$ Easy bruising  '[]'$ Easy bleeding   '[]'$ Hypercoagulable state   '[x]'$ Anemic   Gastrointestinal:  '[]'$ Blood in stool   '[]'$ Vomiting blood  '[x]'$ Gastroesophageal reflux/heartburn   '[]'$ Abdominal pain Genitourinary:  '[]'$ Chronic kidney disease   '[]'$ Difficult urination  '[]'$ Frequent urination  '[]'$ Burning with urination   '[]'$ Hematuria Skin:  '[]'$ Rashes   '[]'$ Ulcers   '[]'$ Wounds Psychological:  '[x]'$ History of anxiety   '[x]'$  History of major depression.  Physical Examination  Vitals:   09/29/22 0919  BP: 122/73  Pulse: 64  Resp: 18  Weight: 210 lb (95.3 kg)  Height: '5\' 11"'$  (1.803 m)   Body mass  index is 29.29 kg/m. Gen:  WD/WN, NAD Head: [place]/AT, No temporalis wasting. Ear/Nose/Throat: Hearing grossly intact, nares w/o erythema or drainage, trachea midline Eyes: Conjunctiva clear. Sclera non-icteric Neck: Supple.  No bruit  Pulmonary:  Good air movement, equal and clear to auscultation bilaterally.  Cardiac: RRR, No JVD Vascular:  Vessel Right Left  Radial Palpable Palpable           Musculoskeletal: M/S 5/5 throughout.  No deformity or atrophy. No edema. Neurologic: CN 2-12 intact. Sensation grossly intact in extremities.  Symmetrical.  Speech is limited and struggles with aphasia issues. Psychiatric: Judgment intact, Mood & affect appropriate for pt's clinical situation. Dermatologic: No rashes or ulcers noted.  No cellulitis or open wounds.     CBC Lab Results  Component Value Date   WBC 7.5 06/06/2022   HGB 13.2 06/06/2022   HCT 38.2 (L) 06/06/2022   MCV 91.6 06/06/2022   PLT 155 06/06/2022    BMET    Component Value Date/Time   NA 141 06/06/2022 0303   NA 141 05/11/2012 1929   K 3.5 06/06/2022 0303   K 3.9 05/11/2012 1929   CL 111 06/06/2022 0303   CL 107 05/11/2012 1929   CO2 25 06/06/2022 0303   CO2 26 05/11/2012 1929   GLUCOSE 137 (H) 06/06/2022 0303   GLUCOSE 144 (H) 05/11/2012 1929   BUN 13 06/06/2022 0303   BUN 11 05/11/2012 1929   CREATININE 0.87 06/06/2022 0303   CREATININE 0.85 05/11/2012 1929   CALCIUM 8.4 (L) 06/06/2022 0303   CALCIUM 8.5 05/11/2012 1929   GFRNONAA >60 06/06/2022 0303   GFRNONAA >60 05/11/2012 1929   GFRAA >60 07/01/2015 0857   GFRAA >60 05/11/2012 1929   CrCl cannot be calculated (Patient's most recent lab result is older than the maximum 21 days allowed.).  COAG Lab Results  Component Value Date   INR 1.06 07/01/2015   INR 1.03 09/20/2012   INR 0.9 05/11/2012    Radiology No results found.   Assessment/Plan Bilateral carotid artery stenosis Duplex today shows velocities in the 1 to 39% range in both  carotid arteries without significant recurrent stenosis after left carotid stent placement.  Doing well.  Continue current medical regimen including aspirin Plavix.  Recheck in 3 months with carotid duplex.  Benign essential hypertension blood pressure control important in reducing the progression of atherosclerotic disease. On appropriate oral medications.     Type 2 diabetes mellitus without complications ([name]) blood glucose control important in reducing the progression of atherosclerotic disease. Also, involved in wound healing. On appropriate medications.     HLD (hyperlipidemia) lipid control important in reducing the progression of atherosclerotic disease. Continue statin therapy   Leotis Pain, MD  09/29/2022 9:50 AM    This note was created with Dragon medical transcription system.  Any errors from dictation are purely unintentional

## 2022-10-03 ENCOUNTER — Ambulatory Visit: Payer: Medicare HMO | Admitting: Speech Pathology

## 2022-10-09 ENCOUNTER — Ambulatory Visit: Payer: Medicare HMO | Attending: Neurology | Admitting: Speech Pathology

## 2022-10-09 DIAGNOSIS — R4701 Aphasia: Secondary | ICD-10-CM

## 2022-10-09 NOTE — Therapy (Unsigned)
OUTPATIENT SPEECH LANGUAGE PATHOLOGY TREATMENT     Patient Name: Paul Bass MRN: [id number] DOB:[birth year], 77 y.o., male Today's Date: 10/10/2022   PCP: [name], NP REFERRING PROVIDER: Jennings Books, MD   End of Session - 10/09/22 1453     Visit Number 32    Number of Visits 61    Date for SLP Re-Evaluation 11/07/22    Authorization Type [employer] H[employer]    Authorization Time Period 06/04/2023 thru 11/07/2022    Authorization - Visit Number 57    Authorization - Number of Visits 36    Progress Note Due on Visit 75    SLP Start Time 1400    SLP Stop Time  1500    SLP Time Calculation (min) 60 min    Activity Tolerance Patient tolerated treatment well             Past Medical History:  Diagnosis Date   Accelerated junctional rhythm    Anemia    Anxiety    a.) Tx'd with BZO PRN   Arthritis    Bilateral carotid artery disease ([name])    BPH (benign prostatic hypertrophy)    CAD S/P percutaneous coronary angioplasty    a.) PCI in 2002 placing a RCA stent (unknown type). b.) LHC 12/07/2015: 5% ISR m-dRCA, 90% dRCA, 25% pRCA, 25% p-mLCx, 100% OM3, 95% oOM2-OM2, 75% m-dLAD; refer to CVTS. c.) 3v CABG 01/03/2016   CHF (congestive heart failure) ([place])    a.) TTE 05/12/2012: EF 50-55%; mild LA enlargement; G1DD. b.)  TTE 11/26/2015: EF 45%; mild BAE; triv PR, mild MR/TR; inferolateral HK; G1DD. c.)  TTE 11/07/2017: EF 35%; RV enlargement; BAE; inferior and inferolateral HK; triv PR; mild MR/TR. d.)  TTE 09/17/2019: EF 35%; mild LVH; triv MR/TR. e.)  TTE 08/11/2021: EF 50%; triv MR/TR; G1DD.   Cognitive deficit following cerebrovascular accident (CVA)    COPD (chronic obstructive pulmonary disease) ([name])    Cortical cataract    Depression    Dyspnea    GERD (gastroesophageal reflux disease)    History of 2019 novel coronavirus disease (COVID-19) 01/19/2021   History of acute inferior wall MI 2002   a.) PCI was performed placing a RCA stent (unknown type)    History of kidney stones    HTN (hypertension)    Hyperlipidemia    Hypothyroidism    Long term current use of antithrombotics/antiplatelets    a.) DAPT therapy (ASA + clopidogrel)   OSA (obstructive sleep apnea)    a.) does not require nocturnal PAP therapy   PLMD (periodic limb movement disorder)    Postoperative atrial fibrillation ([place]) 01/03/2016   a.) following CABG procedure   Prostatitis    PVD (peripheral vascular disease) ([place])    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 b/) residual RIGHT sided weakness   T2DM (type 2 diabetes mellitus) ([place])    Tobacco abuse    Past Surgical History:  Procedure Laterality Date   CARDIAC CATHETERIZATION N/A 12/07/2015   Procedure: Left Heart Cath and Coronary Angiography;  Surgeon: Corey Skains, MD;  Location: [facility];  Service: Cardiovascular;  Laterality: N/A;   CAROTID ANGIOGRAPHY N/A 06/05/2022   Procedure: CAROTID ANGIOGRAPHY;  Surgeon: Algernon Huxley, MD;  Location: [facility];  Service: Cardiovascular;  Laterality: N/A;   CAROTID PTA/STENT INTERVENTION Left    HX: 2 stents   CATARACT EXTRACTION W/PHACO Left 03/24/2021   Procedure: CATARACT EXTRACTION PHACO AND INTRAOCULAR LENS PLACEMENT (IOC) LEFT DIABETIC 6.67 00:42.5;  Surgeon: Birder Robson, MD;  Location: [facility];  Service: Ophthalmology;  Laterality: Left;   COLONOSCOPY WITH PROPOFOL N/A 02/18/2019   Procedure: COLONOSCOPY WITH PROPOFOL;  Surgeon: Lucilla Lame, MD;  Location: [facility];  Service: Endoscopy;  Laterality: N/A;   COLONOSCOPY WITH PROPOFOL N/A 12/22/2021   Procedure: COLONOSCOPY WITH PROPOFOL;  Surgeon: Lucilla Lame, MD;  Location: [facility] ENDOSCOPY;  Service: Endoscopy;  Laterality: N/A;   CORONARY  ANGIOPLASTY WITH STENT PLACEMENT Left 2002   CORONARY ARTERY BYPASS GRAFT N/A 01/03/2016   Procedure: 3v CORONARY ARTERY BYPASS GRAFT (LIMA-LAD, SVG-PDA, SVG-OM2); Location: [facility]; Surgeon: Dwaine Deter, MD   CYSTOSCOPY W/ URETERAL STENT REMOVAL N/A 10/04/2021   Procedure: CYSTOSCOPY WITH PROSTATE FOREIGN BODY REMOVAL;  Surgeon: Abbie Sons, MD;  Location: [facility];  Service: Urology;  Laterality: N/A;   CYSTOSCOPY WITH INSERTION OF UROLIFT N/A 04/29/2019   Procedure: CYSTOSCOPY WITH INSERTION OF UROLIFT;  Surgeon: Abbie Sons, MD;  Location: [facility];  Service: Urology;  Laterality: N/A;   ESOPHAGOGASTRODUODENOSCOPY (EGD) WITH PROPOFOL N/A 09/11/2016   Procedure: ESOPHAGOGASTRODUODENOSCOPY (EGD) WITH PROPOFOL;  Surgeon: Lucilla Lame, MD;  Location: [facility];  Service: Endoscopy;  Laterality: N/A;   ESOPHAGOGASTRODUODENOSCOPY (EGD) WITH PROPOFOL N/A 10/17/2016   Procedure: ESOPHAGOGASTRODUODENOSCOPY (EGD) WITH PROPOFOL;  Surgeon: Lucilla Lame, MD;  Location: [facility];  Service: Endoscopy;  Laterality: N/A;   ESOPHAGOGASTRODUODENOSCOPY (EGD) WITH PROPOFOL N/A 11/13/2017   Procedure: ESOPHAGOGASTRODUODENOSCOPY (EGD) WITH PROPOFOL;  Surgeon: Lucilla Lame, MD;  Location: [facility];  Service: Endoscopy;  Laterality: N/A;   pins R lower leg Left    rotator cuff replaced Right    SAVORY DILATION N/A 10/17/2016   Procedure: SAVORY DILATION;  Surgeon: Lucilla Lame, MD;  Location: [facility];  Service: Endoscopy;  Laterality: N/A;   TRANSURETHRAL INCISION OF PROSTATE N/A 10/04/2021   Procedure: TRANSURETHRAL INCISION OF THE PROSTATE (TUIP);  Surgeon: Abbie Sons, MD;  Location: [facility];  Service: Urology;  Laterality: N/A;   Patient Active Problem List   Diagnosis Date Noted   Carotid stenosis, symptomatic, with infarction ([place]) 06/05/2022   History of colonic polyps    Anxiety state 11/17/2021   Cognitive deficit, post-stroke 11/17/2021   Ex-smoker 11/17/2021    Bradycardia 99991111   Chronic systolic CHF (congestive heart failure), NYHA class 2 ([place]) 05/27/2020   Accelerated junctional rhythm 04/21/2019   Encounter for screening colonoscopy    Polyp of colon    Atherosclerotic heart disease of native coronary artery without angina pectoris 01/14/2019   Depression 01/14/2019   GERD (gastroesophageal reflux disease) 01/14/2019   Periodic limb movement disorder (PLMD) 01/14/2019   Sleep apnea 01/14/2019   Type 2 diabetes mellitus without complications ([place]) 99991111   Stricture and stenosis of esophagus    Problems with swallowing and mastication    Food impaction of esophagus    H/O coronary artery bypass surgery 01/17/2016   Cerebrovascular accident (CVA) due to embolism ([place]) 01/03/2016   Hypothyroidism, unspecified 01/03/2016   HLD (hyperlipidemia) 01/03/2016   Stable angina 11/29/2015   Benign essential hypertension 05/27/2015   Bilateral carotid artery stenosis 02/24/2015   Atherosclerotic peripheral vascular disease ([place]) 09/07/2014   Bladder calculus 08/12/2014   Primary osteoarthritis of left knee 03/10/2014  Elevated prostate specific antigen (PSA) 02/05/2013   Snoring 01/22/2013   Sleepiness 01/22/2013   Dysphagia, unspecified(787.20) 01/22/2013   Occlusion and stenosis of carotid artery without mention of cerebral infarction 01/22/2013   Cerebral thrombosis with cerebral infarction ([place]) 01/22/2013   Benign prostatic hyperplasia with incomplete bladder emptying 08/26/2012   Chronic prostatitis 08/26/2012   Family history of malignant neoplasm of prostate 08/26/2012   Incomplete emptying of bladder 08/26/2012   Dysphagia 05/14/2012   Global aphasia 05/14/2012   Stroke, acute, thrombotic ([place]) 05/12/2012   Symptomatic carotid artery stenosis 05/12/2012   HTN (hypertension) 05/12/2012   COPD (chronic obstructive pulmonary disease) ([place]) 05/12/2012   Acute respiratory failure ([place]) 05/12/2012    ONSET DATE:  05/13/2012  REFERRING DIAG: [id number] (ICD-10-CM) - Cognitive deficit, post-stroke   PERTINENT HISTORY: Pt is a 77 year old male with past medical hx of anxiety, COP, depression, diabetes mellitus without complication, HTN, hyperlipidemia.    Of note, pt received Outpatient ST services for a total of 28 sessions ending on 03/24/2020. At the end of that POC pt required moderate assistance.      DIAGNOSTIC FINDINGS:  MRI 10/03/2019  No acute intracranial abnormality. 2. Moderate-sized chronic left MCA infarct. 3. Mild chronic small vessel ischemic disease, mildly progressed from 2014. 4. Progressive cerebral atrophy.  THERAPY DIAG:  Aphasia  Rationale for Evaluation and Treatment Rehabilitation  SUBJECTIVE: Pt presented in a pleasant mood, stated he was feeling better after coming down with the flu last week.   Pt accompanied by: self  PAIN:  Are you having pain? No  PATIENT GOALS: he would like to be able to communicate better   OBJECTIVE:   TODAY'S TREATMENT: Skilled treatment session targeted pt's word finding and communication breakdown goals. SLP facilitated session by providing the following interventions:  During treatment, SLP facilitated conversation utilizing the patients speech generating device. During session, patient switched between verbal speech and his device when needing assistance with word finding difficulties. During conversation, pt utilized his device with <60% accuracy, requiring multimodal assistance.  SLP facilitated a role playing scenario targeting activities pt frequents in the community. Pt utilized his device with 75% accuracy, requiring moderate cues to locate icons. Decrease in accuracy believed to be due to pt illness and lack of practice.   Pt benefited from fill in the blank prompts.  Pt noted to have difficulty reading numbers, words, and phrases on device intelligibility--pt cued to utilize atomized voice on device to aid in moments of  unintelligibility.   PATIENT EDUCATION: Education details: see above Person educated: Patient and wife (via text) Education method: Explanation and Handouts Education comprehension: verbalized understanding  HOME EXERCISE PROGRAM:   Written homework provided for pt's wife pt questions based on [place] to help pt locate icons.   Instructed pt to take device with him to restaurants to aid in ordering food.    GOALS: Goals reviewed with patient? Yes   SHORT TERM GOALS: Target date: 10 sessions UPDATED: 05/16/2022 UPDATED: 07/07/2022 Pt will learn strategies (such as drawing, gestures) to aid during communication breakdowns.         Baseline: only using circumlocutions 05/16/2022  Goal status: DISCONTINUED, pt unable d/t motor deficits   2.  With minimal assistance, pt will use strategies to help during communication breakdowns.  Baseline: new goal 05/16/2022  Goal status: DISCONTINUED, no viable strategies identified as successful  3.  Pt will respond to general questions using AAC/nonverbal communication effectively with 50% accuracy and mod cues.  Baseline:  25% Goal Status: INITIAL 07/07/2022 Goal Status: MET Updated Goal: Pt will respond to general questions using AAC/nonverbal communication effectively with 75% and Min cues.  08/15/2022 Goal Status: ONGOING  4.  Pt will use AAC to communicate within group situations with 50% accuracy given Mod to Min cues.   Baseline: 15% Goal Status: INITIAL 07/07/2022 Goal Status: MET Updated Goal: Pt will use AAC to communication within group situations with 75% accuracy given Min A cues.  08/15/2022 Goal Status: ONGOING  5.   Pt will use AAC to communicate in a structured conversation setting with 75% accuracy and min cues.  Baseline: 25% Goal Status: MET 07/07/2022 Goal Status: MET Updated Goal: Pt will use AAC to communicate in a structured conversation setting with 80% accuracy and intermittent cues.  08/15/2022 Goal  Status: ONGOING   6. Given a structured role play scenario targeting community based activities, the pt will utilize his device in 5/7 opportunities given min/mod cues.   Goal Status: Initial   09/19/2022   LONG TERM GOALS: Target date: 11/07/2022  UPDATED: 05/16/2022  UPDATED: 08/15/2022 Pt will utilize strategies to aid in communication breakdowns with Mod I.  Baseline: only using circumlocutions Goal status: DISCONTINUED, no viable strategies identified as successful  2.  To maximize functional communication across all communication contexts using AAC and multimodal means of communication. Baseline: 25% Goal status: ONGOING      ASSESSMENT:  CLINICAL IMPRESSION: Recommend further practice using device at home and in social situations, spending more time in each category of icons to further familiarize the patient where to locate information. Pt would benefit from continued use of device during conversations, as he currently needs moderate assistance to locate icons when asked questions.    OBJECTIVE IMPAIRMENTS include expressive language and aphasia. These impairments are limiting patient from effectively communicating at home and in community. Factors affecting potential to achieve goals and functional outcome are  time post onset (10 years) and previous ST intervention . Patient will benefit from skilled SLP services to address above impairments and improve overall function.  REHAB POTENTIAL: Fair time post onset (10 years)  PLAN: SLP FREQUENCY: 1-2x/week  SLP DURATION: 8 weeks  PLANNED INTERVENTIONS: Language facilitation, Internal/external aids, SLP instruction and feedback, Compensatory strategies, and Patient/family education  Alphonzo Grieve, SLP Graduate Clinician    Happi B. Rutherford Nail, M.S., CCC-SLP, Mining engineer Certified Brain Injury [place]  [place] Office [phone] Ascom  [phone] Fax [phone]

## 2022-10-11 ENCOUNTER — Ambulatory Visit: Payer: Medicare HMO | Admitting: Speech Pathology

## 2022-10-11 DIAGNOSIS — R4701 Aphasia: Secondary | ICD-10-CM | POA: Diagnosis not present

## 2022-10-11 NOTE — Therapy (Signed)
OUTPATIENT SPEECH LANGUAGE PATHOLOGY TREATMENT     Patient Name: Paul Bass MRN: [id number] DOB:1946-05-09, 77 y.o., male Today's Date: 10/11/2022   PCP: [name], NP REFERRING PROVIDER: Jennings Books, MD   End of Session - 10/11/22 1324     Visit Number 33    Number of Visits 78    Date for SLP Re-Evaluation 11/07/22    Authorization Type [employer] H[employer]    Authorization Time Period 06/04/2023 thru 11/07/2022    Authorization - Visit Number 90    Authorization - Number of Visits 36    Progress Note Due on Visit 28    SLP Start Time 1300    SLP Stop Time  1400    SLP Time Calculation (min) 60 min    Activity Tolerance Patient tolerated treatment well             Past Medical History:  Diagnosis Date   Accelerated junctional rhythm    Anemia    Anxiety    a.) Tx'd with BZO PRN   Arthritis    Bilateral carotid artery disease ([name])    BPH (benign prostatic hypertrophy)    CAD S/P percutaneous coronary angioplasty    a.) PCI in 2002 placing a RCA stent (unknown type). b.) LHC 12/07/2015: 5% ISR m-dRCA, 90% dRCA, 25% pRCA, 25% p-mLCx, 100% OM3, 95% oOM2-OM2, 75% m-dLAD; refer to CVTS. c.) 3v CABG 01/03/2016   CHF (congestive heart failure) ([place])    a.) TTE 05/12/2012: EF 50-55%; mild LA enlargement; G1DD. b.)  TTE 11/26/2015: EF 45%; mild BAE; triv PR, mild MR/TR; inferolateral HK; G1DD. c.)  TTE 11/07/2017: EF 35%; RV enlargement; BAE; inferior and inferolateral HK; triv PR; mild MR/TR. d.)  TTE 09/17/2019: EF 35%; mild LVH; triv MR/TR. e.)  TTE 08/11/2021: EF 50%; triv MR/TR; G1DD.   Cognitive deficit following cerebrovascular accident (CVA)    COPD (chronic obstructive pulmonary disease) ([name])    Cortical cataract    Depression    Dyspnea    GERD (gastroesophageal reflux disease)    History of 2019 novel coronavirus disease (COVID-19) 01/19/2021   History of acute inferior wall MI 2002   a.) PCI was performed placing a RCA stent (unknown type)    History of kidney stones    HTN (hypertension)    Hyperlipidemia    Hypothyroidism    Long term current use of antithrombotics/antiplatelets    a.) DAPT therapy (ASA + clopidogrel)   OSA (obstructive sleep apnea)    a.) does not require nocturnal PAP therapy   PLMD (periodic limb movement disorder)    Postoperative atrial fibrillation ([place]) 01/03/2016   a.) following CABG procedure   Prostatitis    PVD (peripheral vascular disease) ([place])    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 b/) residual RIGHT sided weakness   T2DM (type 2 diabetes mellitus) ([place])    Tobacco abuse    Past Surgical History:  Procedure Laterality Date   CARDIAC CATHETERIZATION N/A 12/07/2015   Procedure: Left Heart Cath and Coronary Angiography;  Surgeon: Corey Skains, MD;  Location: [facility];  Service: Cardiovascular;  Laterality: N/A;   CAROTID ANGIOGRAPHY N/A 06/05/2022   Procedure: CAROTID ANGIOGRAPHY;  Surgeon: Algernon Huxley, MD;  Location: [facility];  Service: Cardiovascular;  Laterality: N/A;   CAROTID PTA/STENT INTERVENTION Left    HX: 2 stents   CATARACT EXTRACTION W/PHACO Left 03/24/2021   Procedure: CATARACT EXTRACTION PHACO AND INTRAOCULAR LENS PLACEMENT (IOC) LEFT DIABETIC 6.67 00:42.5;  Surgeon: Birder Robson, MD;  Location: [facility];  Service: Ophthalmology;  Laterality: Left;   COLONOSCOPY WITH PROPOFOL N/A 02/18/2019   Procedure: COLONOSCOPY WITH PROPOFOL;  Surgeon: Lucilla Lame, MD;  Location: [facility];  Service: Endoscopy;  Laterality: N/A;   COLONOSCOPY WITH PROPOFOL N/A 12/22/2021   Procedure: COLONOSCOPY WITH PROPOFOL;  Surgeon: Lucilla Lame, MD;  [facility];  Service: Endoscopy;  Laterality: N/A;   CORONARY  ANGIOPLASTY WITH STENT PLACEMENT Left 2002   CORONARY ARTERY BYPASS GRAFT N/A 01/03/2016   Procedure: 3v CORONARY ARTERY BYPASS GRAFT (LIMA-LAD, SVG-PDA, SVG-OM2); Location: [facility]; Surgeon: Dwaine Deter, MD   CYSTOSCOPY W/ URETERAL STENT REMOVAL N/A 10/04/2021   Procedure: CYSTOSCOPY WITH PROSTATE FOREIGN BODY REMOVAL;  Surgeon: Abbie Sons, MD;  Location: [facility];  Service: Urology;  Laterality: N/A;   CYSTOSCOPY WITH INSERTION OF UROLIFT N/A 04/29/2019   Procedure: CYSTOSCOPY WITH INSERTION OF UROLIFT;  Surgeon: Abbie Sons, MD;  Location: [facility];  Service: Urology;  Laterality: N/A;   ESOPHAGOGASTRODUODENOSCOPY (EGD) WITH PROPOFOL N/A 09/11/2016   Procedure: ESOPHAGOGASTRODUODENOSCOPY (EGD) WITH PROPOFOL;  Surgeon: Lucilla Lame, MD;  Location: [facility] ENDOSCOPY;  Service: Endoscopy;  Laterality: N/A;   ESOPHAGOGASTRODUODENOSCOPY (EGD) WITH PROPOFOL N/A 10/17/2016   Procedure: ESOPHAGOGASTRODUODENOSCOPY (EGD) WITH PROPOFOL;  Surgeon: Lucilla Lame, MD;  Location: [facility] END[facility];  Service: Endoscopy;  Laterality: N/A;   ESOPHAGOGASTRODUODENOSCOPY (EGD) WITH PROPOFOL N/A 11/13/2017   Procedure: ESOPHAGOGASTRODUODENOSCOPY (EGD) WITH PROPOFOL;  Surgeon: Lucilla Lame, MD;  Location: [facility] ENDOSCOPY;  Service: Endoscopy;  Laterality: N/A;   pins R lower leg Left    rotator cuff replaced Right    SAVORY DILATION N/A 10/17/2016   Procedure: SAVORY DILATION;  Surgeon: Lucilla Lame, MD;  Location: [facility] ENDOSCOPY;  Service: Endoscopy;  Laterality: N/A;   TRANSURETHRAL INCISION OF PROSTATE N/A 10/04/2021   Procedure: TRANSURETHRAL INCISION OF THE PROSTATE (TUIP);  Surgeon: Abbie Sons, MD;  Location: [facility];  Service: Urology;  Laterality: N/A;   Patient Active Problem List   Diagnosis Date Noted   Carotid stenosis, symptomatic, with infarction ([place]) 06/05/2022   History of colonic polyps    Anxiety state 11/17/2021   Cognitive deficit, post-stroke 11/17/2021   Ex-smoker 11/17/2021    Bradycardia 99991111   Chronic systolic CHF (congestive heart failure), NYHA class 2 ([place]) 05/27/2020   Accelerated junctional rhythm 04/21/2019   Encounter for screening colonoscopy    Polyp of colon    Atherosclerotic heart disease of native coronary artery without angina pectoris 01/14/2019   Depression 01/14/2019   GERD (gastroesophageal reflux disease) 01/14/2019   Periodic limb movement disorder (PLMD) 01/14/2019   Sleep apnea 01/14/2019   Type 2 diabetes mellitus without complications ([place]) 99991111   Stricture and stenosis of esophagus    Problems with swallowing and mastication    Food impaction of esophagus    H/O coronary artery bypass surgery 01/17/2016   Cerebrovascular accident (CVA) due to embolism ([place]) 01/03/2016   Hypothyroidism, unspecified 01/03/2016   HLD (hyperlipidemia) 01/03/2016   Stable angina 11/29/2015   Benign essential hypertension 05/27/2015   Bilateral carotid artery stenosis 02/24/2015   Atherosclerotic peripheral vascular disease ([place]) 09/07/2014   Bladder calculus 08/12/2014   Primary osteoarthritis of left knee 03/10/2014  Elevated prostate specific antigen (PSA) 02/05/2013   Snoring 01/22/2013   Sleepiness 01/22/2013   Dysphagia, unspecified(787.20) 01/22/2013   Occlusion and stenosis of carotid artery without mention of cerebral infarction 01/22/2013   Cerebral thrombosis with cerebral infarction ([place]) 01/22/2013   Benign prostatic hyperplasia with incomplete bladder emptying 08/26/2012   Chronic prostatitis 08/26/2012   Family history of malignant neoplasm of prostate 08/26/2012   Incomplete emptying of bladder 08/26/2012   Dysphagia 05/14/2012   Global aphasia 05/14/2012   Stroke, acute, thrombotic ([place]) 05/12/2012   Symptomatic carotid artery stenosis 05/12/2012   HTN (hypertension) 05/12/2012   COPD (chronic obstructive pulmonary disease) ([place]) 05/12/2012   Acute respiratory failure ([place]) 05/12/2012    ONSET DATE:  05/13/2012  REFERRING DIAG: [id number] (ICD-10-CM) - Cognitive deficit, post-stroke   PERTINENT HISTORY: Pt is a 77 year old male with past medical hx of anxiety, COP, depression, diabetes mellitus without complication, HTN, hyperlipidemia.    Of note, pt received Outpatient ST services for a total of 28 sessions ending on 03/24/2020. At the end of that POC pt required moderate assistance.      DIAGNOSTIC FINDINGS:  MRI 10/03/2019  No acute intracranial abnormality. 2. Moderate-sized chronic left MCA infarct. 3. Mild chronic small vessel ischemic disease, mildly progressed from 2014. 4. Progressive cerebral atrophy.  THERAPY DIAG:  Aphasia  Rationale for Evaluation and Treatment Rehabilitation  SUBJECTIVE: Pt presented in a pleasant mood, reported he has not been using his device at home.   Pt accompanied by: self  PAIN:  Are you having pain? No  PATIENT GOALS: he would like to be able to communicate better   OBJECTIVE:   TODAY'S TREATMENT: Skilled treatment session targeted pt's word finding and communication breakdown goals. SLP facilitated session by providing the following interventions:  During treatment, SLP facilitated conversation utilizing the patients speech generating device. During session, patient switched between verbal speech and his device when needing assistance with word finding difficulties. During conversation, pt utilized his device with <50% accuracy, requiring multimodal assistance.  SLP facilitated a role playing scenario targeting activities pt frequents in the community. Pt utilized his device with 75% accuracy, requiring moderate cues to locate icons. Decrease in accuracy believed to be due to pt lack of practice.      PATIENT EDUCATION: Education details: see above Person educated: Patient and wife (via text) Education method: Explanation and Handouts Education comprehension: verbalized understanding  HOME EXERCISE PROGRAM:   Written homework  provided for pt's wife pt questions based on [place] to help pt locate icons.   Instructed pt to take device with him to restaurants to aid in ordering food.    GOALS: Goals reviewed with patient? Yes   SHORT TERM GOALS: Target date: 10 sessions UPDATED: 05/16/2022 UPDATED: 07/07/2022 Pt will learn strategies (such as drawing, gestures) to aid during communication breakdowns.         Baseline: only using circumlocutions 05/16/2022  Goal status: DISCONTINUED, pt unable d/t motor deficits   2.  With minimal assistance, pt will use strategies to help during communication breakdowns.  Baseline: new goal 05/16/2022  Goal status: DISCONTINUED, no viable strategies identified as successful  3.  Pt will respond to general questions using AAC/nonverbal communication effectively with 50% accuracy and mod cues.  Baseline: 25% Goal Status: INITIAL 07/07/2022 Goal Status: MET Updated Goal: Pt will respond to general questions using AAC/nonverbal communication effectively with 75% and Min cues.  08/15/2022 Goal Status: ONGOING  4.  Pt will use AAC to communicate  within group situations with 50% accuracy given Mod to Min cues.   Baseline: 15% Goal Status: INITIAL 07/07/2022 Goal Status: MET Updated Goal: Pt will use AAC to communication within group situations with 75% accuracy given Min A cues.  08/15/2022 Goal Status: ONGOING  5.   Pt will use AAC to communicate in a structured conversation setting with 75% accuracy and min cues.  Baseline: 25% Goal Status: MET 07/07/2022 Goal Status: MET Updated Goal: Pt will use AAC to communicate in a structured conversation setting with 80% accuracy and intermittent cues.  08/15/2022 Goal Status: ONGOING   6. Given a structured role play scenario targeting community based activities, the pt will utilize his device in 5/7 opportunities given min/mod cues.   Goal Status: Initial   09/19/2022   LONG TERM GOALS: Target date: 11/07/2022   UPDATED: 05/16/2022  UPDATED: 08/15/2022 Pt will utilize strategies to aid in communication breakdowns with Mod I.  Baseline: only using circumlocutions Goal status: DISCONTINUED, no viable strategies identified as successful  2.  To maximize functional communication across all communication contexts using AAC and multimodal means of communication. Baseline: 25% Goal status: ONGOING      ASSESSMENT:  CLINICAL IMPRESSION: Pt prognosis guarded as pt reports lack of use of device at home and in social settings. Pt demonstrates a lack of concrete understanding/memorization of where his icons are on his device and while he would benefit from continued practice, lacks initiation to do so outside of the therapy room. Recommend pts wife come in for training for device maintenance prior to pt discharge. Pt placed on hold pending appt with his wife.    Recommend further practice using device at home and in social situations, spending more time in each category of icons to further familiarize the patient where to locate information. Pt would benefit from continued use of device during conversations, as he currently needs moderate assistance to locate icons when asked questions.    OBJECTIVE IMPAIRMENTS include expressive language and aphasia. These impairments are limiting patient from effectively communicating at home and in community. Factors affecting potential to achieve goals and functional outcome are  time post onset (10 years) and previous ST intervention . Patient will benefit from skilled SLP services to address above impairments and improve overall function.  REHAB POTENTIAL: Fair time post onset (10 years)  PLAN: SLP FREQUENCY: 1-2x/week  SLP DURATION: 8 weeks  PLANNED INTERVENTIONS: Language facilitation, Internal/external aids, SLP instruction and feedback, Compensatory strategies, and Patient/family education  Alphonzo Grieve, SLP Graduate Clinician    Happi B. Rutherford Nail, M.S.,  CCC-SLP, Mining engineer Certified Brain Injury [place]  [place] Office [phone] Ascom [phone] Fax [phone]

## 2022-10-16 ENCOUNTER — Ambulatory Visit: Payer: Medicare HMO | Admitting: Speech Pathology

## 2022-10-23 DIAGNOSIS — R338 Other retention of urine: Secondary | ICD-10-CM | POA: Diagnosis not present

## 2022-10-23 DIAGNOSIS — N401 Enlarged prostate with lower urinary tract symptoms: Secondary | ICD-10-CM | POA: Diagnosis not present

## 2022-10-23 DIAGNOSIS — Z7982 Long term (current) use of aspirin: Secondary | ICD-10-CM | POA: Diagnosis not present

## 2022-10-23 DIAGNOSIS — N35014 Post-traumatic urethral stricture, male, unspecified: Secondary | ICD-10-CM | POA: Diagnosis not present

## 2022-10-23 DIAGNOSIS — R3915 Urgency of urination: Secondary | ICD-10-CM | POA: Diagnosis not present

## 2022-10-23 DIAGNOSIS — Z79899 Other long term (current) drug therapy: Secondary | ICD-10-CM | POA: Diagnosis not present

## 2022-10-23 DIAGNOSIS — N390 Urinary tract infection, site not specified: Secondary | ICD-10-CM | POA: Diagnosis not present

## 2022-10-26 DIAGNOSIS — H43813 Vitreous degeneration, bilateral: Secondary | ICD-10-CM | POA: Diagnosis not present

## 2022-10-26 DIAGNOSIS — E119 Type 2 diabetes mellitus without complications: Secondary | ICD-10-CM | POA: Diagnosis not present

## 2022-10-26 DIAGNOSIS — Z961 Presence of intraocular lens: Secondary | ICD-10-CM | POA: Diagnosis not present

## 2022-10-26 DIAGNOSIS — H1711 Central corneal opacity, right eye: Secondary | ICD-10-CM | POA: Diagnosis not present

## 2022-11-03 ENCOUNTER — Ambulatory Visit: Payer: Medicare HMO | Attending: Neurology | Admitting: Speech Pathology

## 2022-11-03 DIAGNOSIS — I63512 Cerebral infarction due to unspecified occlusion or stenosis of left middle cerebral artery: Secondary | ICD-10-CM | POA: Insufficient documentation

## 2022-11-03 DIAGNOSIS — R4701 Aphasia: Secondary | ICD-10-CM | POA: Diagnosis not present

## 2022-11-03 NOTE — Therapy (Unsigned)
OUTPATIENT SPEECH LANGUAGE PATHOLOGY TREATMENT   DISCHARGE SUMMARY    Patient Name: Paul Bass MRN: [id number] DOB:05-20-1946, 77 y.o., male Today's Date: 11/03/2022   PCP: Myrene BuddySarah Kathryn Gauger, NP REFERRING PROVIDER: Cristopher PeruHemang Shah, MD   End of Session - 11/03/22 0900     Visit Number 34    Number of Visits 50    Date for SLP Re-Evaluation 11/07/22    Authorization Type [employer]    Authorization Time Period 06/04/2023 thru 11/07/2022    Authorization - Visit Number 21    Authorization - Number of Visits 36    Progress Note Due on Visit 40    SLP Start Time 0800    SLP Stop Time  0900    SLP Time Calculation (min) 60 min    Activity Tolerance Patient tolerated treatment well             Past Medical History:  Diagnosis Date   Accelerated junctional rhythm    Anemia    Anxiety    a.) Tx'd with BZO PRN   Arthritis    Bilateral carotid artery disease ([name])    BPH (benign prostatic hypertrophy)    CAD S/P percutaneous coronary angioplasty    a.) PCI in 2002 placing a RCA stent (Paul type). b.) LHC 12/07/2015: 5% ISR m-dRCA, 90% dRCA, 25% pRCA, 25% p-mLCx, 100% OM3, 95% oOM2-OM2, 75% m-dLAD; refer to CVTS. c.) 3v CABG 01/03/2016   CHF (congestive heart failure) ([name])    a.) TTE 05/12/2012: EF 50-55%; mild LA enlargement; G1DD. b.)  TTE 11/26/2015: EF 45%; mild BAE; triv PR, mild MR/TR; inferolateral HK; G1DD. c.)  TTE 11/07/2017: EF 35%; RV enlargement; BAE; inferior and inferolateral HK; triv PR; mild MR/TR. d.)  TTE 09/17/2019: EF 35%; mild LVH; triv MR/TR. e.)  TTE 08/11/2021: EF 50%; triv MR/TR; G1DD.   Cognitive deficit following cerebrovascular accident (CVA)    COPD (chronic obstructive pulmonary disease) ([name])    Cortical cataract    Depression    Dyspnea    GERD (gastroesophageal reflux disease)    History of 2019 novel coronavirus disease (COVID-19) 01/19/2021   History of acute inferior wall MI 2002   a.) PCI was performed placing a RCA  stent (Paul type)   History of kidney stones    HTN (hypertension)    Hyperlipidemia    Hypothyroidism    Long term current use of antithrombotics/antiplatelets    a.) DAPT therapy (ASA + clopidogrel)   OSA (obstructive sleep apnea)    a.) does not require nocturnal PAP therapy   PLMD (periodic limb movement disorder)    Postoperative atrial fibrillation ([name]) 01/03/2016   a.) following CABG procedure   Prostatitis    PVD (peripheral vascular disease) ([name])    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 b/) residual RIGHT sided weakness   T2DM (type 2 diabetes mellitus) ([name])    Tobacco abuse    Past Surgical History:  Procedure Laterality Date   CARDIAC CATHETERIZATION N/A 12/07/2015   Procedure: Left Heart Cath and Coronary Angiography;  Surgeon: Lamar BlinksBruce J Kowalski, MD;  Location: [facility];  Service: Cardiovascular;  Laterality: N/A;   CAROTID ANGIOGRAPHY N/A 06/05/2022   Procedure: CAROTID ANGIOGRAPHY;  Surgeon: Festus Barrenew, Jason  S, MD;  Location: [facility];  Service: Cardiovascular;  Laterality: N/A;   CAROTID PTA/STENT INTERVENTION Left    HX: 2 stents   CATARACT EXTRACTION W/PHACO Left 03/24/2021   Procedure: CATARACT EXTRACTION PHACO AND INTRAOCULAR LENS PLACEMENT (IOC) LEFT DIABETIC 6.67 00:42.5;  Surgeon: Galen Manila, MD;  Location: [facility];  Service: Ophthalmology;  Laterality: Left;   COLONOSCOPY WITH PROPOFOL N/A 02/18/2019   Procedure: COLONOSCOPY WITH PROPOFOL;  Surgeon: Midge Minium, MD;  Location: [facility];  Service: Endoscopy;  Laterality: N/A;   COLONOSCOPY WITH PROPOFOL N/A 12/22/2021   Procedure: COLONOSCOPY WITH PROPOFOL;  Surgeon: Midge Minium, MD;  Location: [facility] END[facility];  Service: Endoscopy;   Laterality: N/A;   CORONARY ANGIOPLASTY WITH STENT PLACEMENT Left 2002   CORONARY ARTERY BYPASS GRAFT N/A 01/03/2016   Procedure: 3v CORONARY ARTERY BYPASS GRAFT (LIMA-LAD, SVG-PDA, SVG-OM2); Location: [facility]; Surgeon: Cain Sieve, MD   CYSTOSCOPY W/ URETERAL STENT REMOVAL N/A 10/04/2021   Procedure: CYSTOSCOPY WITH PROSTATE FOREIGN BODY REMOVAL;  Surgeon: Riki Altes, MD;  Location: [facility];  Service: Urology;  Laterality: N/A;   CYSTOSCOPY WITH INSERTION OF UROLIFT N/A 04/29/2019   Procedure: CYSTOSCOPY WITH INSERTION OF UROLIFT;  Surgeon: Riki Altes, MD;  Location: [facility];  Service: Urology;  Laterality: N/A;   ESOPHAGOGASTRODUODENOSCOPY (EGD) WITH PROPOFOL N/A 09/11/2016   Procedure: ESOPHAGOGASTRODUODENOSCOPY (EGD) WITH PROPOFOL;  Surgeon: Midge Minium, MD;  Location: [facility];  Service: Endoscopy;  Laterality: N/A;   ESOPHAGOGASTRODUODENOSCOPY (EGD) WITH PROPOFOL N/A 10/17/2016   Procedure: ESOPHAGOGASTRODUODENOSCOPY (EGD) WITH PROPOFOL;  Surgeon: Midge Minium, MD;  Location: [facility];  Service: Endoscopy;  Laterality: N/A;   ESOPHAGOGASTRODUODENOSCOPY (EGD) WITH PROPOFOL N/A 11/13/2017   Procedure: ESOPHAGOGASTRODUODENOSCOPY (EGD) WITH PROPOFOL;  Surgeon: Midge Minium, MD;  Location: [facility];  Service: Endoscopy;  Laterality: N/A;   pins R lower leg Left    rotator cuff replaced Right    SAVORY DILATION N/A 10/17/2016   Procedure: SAVORY DILATION;  Surgeon: Midge Minium, MD;  Location: [facility];  Service: Endoscopy;  Laterality: N/A;   TRANSURETHRAL INCISION OF PROSTATE N/A 10/04/2021   Procedure: TRANSURETHRAL INCISION OF THE PROSTATE (TUIP);  Surgeon: Riki Altes, MD;  Location: [facility];  Service: Urology;  Laterality: N/A;   Patient Active Problem List   Diagnosis Date Noted   Carotid stenosis, symptomatic, with infarction 06/05/2022   History of colonic polyps    Anxiety state 11/17/2021   Cognitive deficit, post-stroke 11/17/2021    Ex-smoker 11/17/2021   Bradycardia 02/03/2021   Chronic systolic CHF (congestive heart failure), NYHA class 2 05/27/2020   Accelerated junctional rhythm 04/21/2019   Encounter for screening colonoscopy    Polyp of colon    Atherosclerotic heart disease of native coronary artery without angina pectoris 01/14/2019   Depression 01/14/2019   GERD (gastroesophageal reflux disease) 01/14/2019   Periodic limb movement disorder (PLMD) 01/14/2019   Sleep apnea 01/14/2019   Type 2 diabetes mellitus without complications 01/14/2019   Stricture and stenosis of esophagus    Problems with swallowing and mastication    Food impaction of esophagus    H/O coronary artery bypass surgery 01/17/2016   Cerebrovascular accident (CVA) due to embolism 01/03/2016   Hypothyroidism, unspecified 01/03/2016   HLD (hyperlipidemia) 01/03/2016   Stable angina 11/29/2015   Benign essential hypertension 05/27/2015   Bilateral carotid artery stenosis 02/24/2015   Atherosclerotic peripheral vascular disease 09/07/2014   Bladder calculus 08/12/2014   Primary osteoarthritis of left knee 03/10/2014   Elevated prostate  specific antigen (PSA) 02/05/2013   Snoring 01/22/2013   Sleepiness 01/22/2013   Dysphagia, unspecified(787.20) 01/22/2013   Occlusion and stenosis of carotid artery without mention of cerebral infarction 01/22/2013   Cerebral thrombosis with cerebral infarction 01/22/2013   Benign prostatic hyperplasia with incomplete bladder emptying 08/26/2012   Chronic prostatitis 08/26/2012   Family history of malignant neoplasm of prostate 08/26/2012   Incomplete emptying of bladder 08/26/2012   Dysphagia 05/14/2012   Global aphasia 05/14/2012   Stroke, acute, thrombotic 05/12/2012   Symptomatic carotid artery stenosis 05/12/2012   HTN (hypertension) 05/12/2012   COPD (chronic obstructive pulmonary disease) 05/12/2012   Acute respiratory failure 05/12/2012    ONSET DATE: 05/13/2012  REFERRING DIAG: [id number]  (ICD-10-CM) - Cognitive deficit, post-stroke   PERTINENT HISTORY: Pt is a 77 year old male with past medical hx of anxiety, COP, depression, diabetes mellitus without complication, HTN, hyperlipidemia.    Of note, pt received Outpatient ST services for a total of 28 sessions ending on 03/24/2020. At the end of that POC pt required moderate assistance.      DIAGNOSTIC FINDINGS:  MRI 10/03/2019  No acute intracranial abnormality. 2. Moderate-sized chronic left MCA infarct. 3. Mild chronic small vessel ischemic disease, mildly progressed from 2014. 4. Progressive cerebral atrophy.  THERAPY DIAG:  Aphasia  Left middle cerebral artery stroke  Rationale for Evaluation and Treatment Rehabilitation  SUBJECTIVE: Pt presented in a pleasant mood, accompanied by wife.   Pt accompanied by: self  PAIN:  Are you having pain? No  PATIENT GOALS: he would like to be able to communicate better   OBJECTIVE:   TODAY'S TREATMENT: Skilled treatment session targeted pt's word finding and communication breakdown goals. SLP facilitated session by providing the following interventions:  Pt's wife attended session to complete caregiver training in programming pt's TouchTalk device. Skilled verbal and written information provided with pt's wife able to return demonstrate adding icons, using device to take pictures for visual support of communicative content as well as voice recording of pt. With written sentence and moderate practice, pt able to intelligibly state with pt's wife about to record with rare Min A cues.   Instruction was also provided in backing up the device and updating the device.    PATIENT EDUCATION: Education details: see above Person educated: Patient and pt's wife (in person) Education method: Explanation and Handouts Education comprehension: verbalized understanding  HOME EXERCISE PROGRAM:   Continue using AAC to supplement verbal expression to increase pt's functional  independence   GOALS: Goals reviewed with patient? Yes   SHORT TERM GOALS: Target date: 10 sessions UPDATED: 05/16/2022 UPDATED: 07/07/2022 UPDATED: 11/03/2022 Pt will learn strategies (such as drawing, gestures) to aid during communication breakdowns.         Baseline: only using circumlocutions 05/16/2022  Goal status: DISCONTINUED, pt unable d/t motor deficits   2.  With minimal assistance, pt will use strategies to help during communication breakdowns.  Baseline: new goal 05/16/2022  Goal status: DISCONTINUED, no viable strategies identified as successful  3.  Pt will respond to general questions using AAC/nonverbal communication effectively with 50% accuracy and mod cues.  Baseline: 25% Goal Status: INITIAL 07/07/2022 Goal Status: MET Updated Goal: Pt will respond to general questions using AAC/nonverbal communication effectively with 75% and Min cues.  08/15/2022 Goal Status: ONGOING 11/03/2022 Goal Status: MET  4.  Pt will use AAC to communicate within group situations with 50% accuracy given Mod to Min cues.   Baseline: 15% Goal Status: INITIAL 07/07/2022  Goal Status: MET Updated Goal: Pt will use AAC to communication within group situations with 75% accuracy given Min A cues.  08/15/2022 Goal Status: ONGOING 11/03/2022 Goal Status: MET  5.   Pt will use AAC to communicate in a structured conversation setting with 75% accuracy and min cues.  Baseline: 25% Goal Status: MET 07/07/2022 Goal Status: MET Updated Goal: Pt will use AAC to communicate in a structured conversation setting with 80% accuracy and intermittent cues.  08/15/2022 Goal Status: ONGOING 11/03/2022 Goal Status: MET   6. Given a structured role play scenario targeting community based activities, the pt will utilize his device in 5/7 opportunities given min/mod cues.   Goal Status: Initial   11/03/2022  Goal Status: MET   LONG TERM GOALS: Target date: 11/07/2022  UPDATED: 05/16/2022   UPDATED: 08/15/2022 UPDATED: 11/03/2022 Pt will utilize strategies to aid in communication breakdowns with Mod I.  Baseline: only using circumlocutions Goal status: DISCONTINUED, no viable strategies identified as successful  2.  To maximize functional communication across all communication contexts using AAC and multimodal means of communication. Baseline: 25% Goal status: ONGOING   11/03/2022 Goal status: MET    ASSESSMENT:  CLINICAL IMPRESSION:  Pt's wife attended today's session in an effort to complete caregiver training for continued adaptation of pt's TouchTalk Device. After session, pt's wife voiced understanding of ways to input and revise pt's device. She was also able to successfully demonstrate ability. At this time, no further needs are identified by pt or his wife. SLP is available should any further questions arise but pt is appropriate for discharge from services at this time.       Keiasia Christianson B. Dreama Saa, M.S., CCC-SLP, Tree surgeon Certified Brain Injury Specialist [facility]  [facility] Rehabilitation Services Office [phone] Ascom [phone] Fax [phone]

## 2022-11-06 ENCOUNTER — Other Ambulatory Visit: Payer: Self-pay | Admitting: Gastroenterology

## 2022-11-06 ENCOUNTER — Telehealth: Payer: Self-pay | Admitting: Gastroenterology

## 2022-11-06 NOTE — Telephone Encounter (Signed)
Rx has already been filled   E-Prescribing Status: Receipt confirmed by pharmacy (11/06/2022  9:23 AM EDT)

## 2022-11-06 NOTE — Telephone Encounter (Signed)
Patients wife called to schedule the patient an appointment with Dr. Servando Snare. I scheduled the patient for July 8th at 3:30 in [place]. Requesting refill of Pantoprazole 40 MG.

## 2022-11-16 DIAGNOSIS — L239 Allergic contact dermatitis, unspecified cause: Secondary | ICD-10-CM | POA: Diagnosis not present

## 2022-12-14 DIAGNOSIS — E039 Hypothyroidism, unspecified: Secondary | ICD-10-CM | POA: Diagnosis not present

## 2022-12-14 DIAGNOSIS — F418 Other specified anxiety disorders: Secondary | ICD-10-CM | POA: Diagnosis not present

## 2022-12-14 DIAGNOSIS — E1159 Type 2 diabetes mellitus with other circulatory complications: Secondary | ICD-10-CM | POA: Diagnosis not present

## 2022-12-14 DIAGNOSIS — Z951 Presence of aortocoronary bypass graft: Secondary | ICD-10-CM | POA: Diagnosis not present

## 2022-12-14 DIAGNOSIS — E782 Mixed hyperlipidemia: Secondary | ICD-10-CM | POA: Diagnosis not present

## 2022-12-14 DIAGNOSIS — I498 Other specified cardiac arrhythmias: Secondary | ICD-10-CM | POA: Diagnosis not present

## 2022-12-14 DIAGNOSIS — I10 Essential (primary) hypertension: Secondary | ICD-10-CM | POA: Diagnosis not present

## 2022-12-14 DIAGNOSIS — I110 Hypertensive heart disease with heart failure: Secondary | ICD-10-CM | POA: Diagnosis not present

## 2022-12-14 DIAGNOSIS — I6523 Occlusion and stenosis of bilateral carotid arteries: Secondary | ICD-10-CM | POA: Diagnosis not present

## 2022-12-14 DIAGNOSIS — I2510 Atherosclerotic heart disease of native coronary artery without angina pectoris: Secondary | ICD-10-CM | POA: Diagnosis not present

## 2022-12-14 DIAGNOSIS — Z79899 Other long term (current) drug therapy: Secondary | ICD-10-CM | POA: Diagnosis not present

## 2022-12-14 DIAGNOSIS — I5022 Chronic systolic (congestive) heart failure: Secondary | ICD-10-CM | POA: Diagnosis not present

## 2022-12-14 DIAGNOSIS — I70209 Unspecified atherosclerosis of native arteries of extremities, unspecified extremity: Secondary | ICD-10-CM | POA: Diagnosis not present

## 2022-12-14 DIAGNOSIS — E119 Type 2 diabetes mellitus without complications: Secondary | ICD-10-CM | POA: Diagnosis not present

## 2022-12-14 DIAGNOSIS — J449 Chronic obstructive pulmonary disease, unspecified: Secondary | ICD-10-CM | POA: Diagnosis not present

## 2022-12-14 DIAGNOSIS — E1151 Type 2 diabetes mellitus with diabetic peripheral angiopathy without gangrene: Secondary | ICD-10-CM | POA: Diagnosis not present

## 2022-12-26 ENCOUNTER — Encounter (INDEPENDENT_AMBULATORY_CARE_PROVIDER_SITE_OTHER): Payer: Self-pay | Admitting: Vascular Surgery

## 2022-12-26 ENCOUNTER — Ambulatory Visit (INDEPENDENT_AMBULATORY_CARE_PROVIDER_SITE_OTHER): Payer: Medicare HMO

## 2022-12-26 ENCOUNTER — Ambulatory Visit (INDEPENDENT_AMBULATORY_CARE_PROVIDER_SITE_OTHER): Payer: Medicare HMO | Admitting: Vascular Surgery

## 2022-12-26 VITALS — BP 151/69 | HR 44 | Resp 18 | Ht 71.0 in | Wt 213.2 lb

## 2022-12-26 DIAGNOSIS — E119 Type 2 diabetes mellitus without complications: Secondary | ICD-10-CM

## 2022-12-26 DIAGNOSIS — I6523 Occlusion and stenosis of bilateral carotid arteries: Secondary | ICD-10-CM

## 2022-12-26 DIAGNOSIS — E785 Hyperlipidemia, unspecified: Secondary | ICD-10-CM | POA: Diagnosis not present

## 2022-12-26 DIAGNOSIS — I10 Essential (primary) hypertension: Secondary | ICD-10-CM

## 2022-12-26 NOTE — Progress Notes (Signed)
MRN : [id number]  Paul Bass is a 77 y.o. (04/09/1946) male who presents with chief complaint of  Chief Complaint  Patient presents with   Follow-up    f/u in 3 months with carotid -  .  History of Present Illness: Patient returns in follow-up of his carotid disease.  He is doing well today.  He has previous history of stroke and still has some speech abnormalities but these are stable.  No new symptoms.  About 6 months ago, he underwent left carotid stent placement for recurrent in-stent stenosis.  His duplex today shows stable 1 to 39% right ICA stenosis.  He has velocities by normal criteria that would fall in the 40 to 59% range for left carotid stenosis, but with a stent in place, this would just indicate less than 50% stenosis within the previously placed stent.  Current Outpatient Medications  Medication Sig Dispense Refill   albuterol (VENTOLIN HFA) 108 (90 Base) MCG/ACT inhaler SMARTSIG:2 inhalation Via Inhaler Every 4 Hours PRN     aspirin EC 81 MG tablet Take 1 tablet (81 mg total) by mouth daily at 6 (six) AM. Swallow whole. 30 tablet 12   atorvastatin (LIPITOR) 80 MG tablet Take 80 mg by mouth daily.      buPROPion (WELLBUTRIN XL) 150 MG 24 hr tablet Take 150 mg by mouth at bedtime.     cholecalciferol (VITAMIN D3) 25 MCG (1000 UT) tablet Take 1,000 Units by mouth every evening.     clonazePAM (KLONOPIN) 0.5 MG tablet Take 0.5 mg by mouth 2 (two) times daily.     clopidogrel (PLAVIX) 75 MG tablet Take 75 mg by mouth daily.   1   Coenzyme Q10 (COQ10) 100 MG CAPS Take 100 mg by mouth at bedtime.     Ferrous Sulfate (IRON) 325 (65 Fe) MG TABS Take 325 mg by mouth daily.     finasteride (PROSCAR) 5 MG tablet Take 5 mg by mouth daily.     fluticasone-salmeterol (ADVAIR) 500-50 MCG/ACT AEPB Inhale into the lungs.     glucose blood test strip      levofloxacin (LEVAQUIN) 500 MG tablet Take 500 mg by mouth daily.     levothyroxine (SYNTHROID, LEVOTHROID) 100 MCG tablet Take 100  mcg by mouth daily before breakfast.     loratadine (CLARITIN) 10 MG tablet Take 10 mg by mouth daily.     metFORMIN (GLUCOPHAGE) 500 MG tablet Take 500 mg by mouth 2 (two) times daily.      metoprolol tartrate (LOPRESSOR) 25 MG tablet Take 25 mg by mouth 2 (two) times daily.     Multiple Vitamin (MULTIVITAMIN WITH MINERALS) TABS Take 1 tablet by mouth daily.     naproxen sodium (ALEVE) 220 MG tablet Take 220-440 mg by mouth 2 (two) times daily as needed (pain.).     oxyCODONE-acetaminophen (PERCOCET/ROXICET) 5-325 MG tablet Take 1 tablet by mouth every 6 (six) hours as needed for moderate pain. 20 tablet 0   pantoprazole (PROTONIX) 40 MG tablet TAKE 1 TABLET BY MOUTH EVERYDAY AT BEDTIME 90 tablet 0   psyllium (METAMUCIL SMOOTH TEXTURE) 28 % packet Take 1 packet by mouth daily.     RA KRILL OIL 500 MG CAPS Take 500 mg by mouth at bedtime.      tamsulosin (FLOMAX) 0.4 MG CAPS capsule TAKE 1 CAPSULE BY MOUTH EVERY DAY 90 capsule 1   venlafaxine XR (EFFEXOR-XR) 150 MG 24 hr capsule Take 150 mg by mouth daily.  No current facility-administered medications for this visit.    Past Medical History:  Diagnosis Date   Accelerated junctional rhythm    Anemia    Anxiety    a.) Tx'd with BZO PRN   Arthritis    Bilateral carotid artery disease ([name])    BPH (benign prostatic hypertrophy)    CAD S/P percutaneous coronary angioplasty    a.) PCI in 2002 placing a RCA stent (unknown type). b.) LHC 12/07/2015: 5% ISR m-dRCA, 90% dRCA, 25% pRCA, 25% p-mLCx, 100% OM3, 95% oOM2-OM2, 75% m-dLAD; refer to CVTS. c.) 3v CABG 01/03/2016   CHF (congestive heart failure) ([name])    a.) TTE 05/12/2012: EF 50-55%; mild LA enlargement; G1DD. b.)  TTE 11/26/2015: EF 45%; mild BAE; triv PR, mild MR/TR; inferolateral HK; G1DD. c.)  TTE 11/07/2017: EF 35%; RV enlargement; BAE; inferior and inferolateral HK; triv PR; mild MR/TR. d.)  TTE 09/17/2019: EF 35%; mild LVH; triv MR/TR. e.)  TTE 08/11/2021: EF 50%; triv MR/TR;  G1DD.   Cognitive deficit following cerebrovascular accident (CVA)    COPD (chronic obstructive pulmonary disease) ([name])    Cortical cataract    Depression    Dyspnea    GERD (gastroesophageal reflux disease)    History of 2019 novel coronavirus disease (COVID-19) 01/19/2021   History of acute inferior wall MI 2002   a.) PCI was performed placing a RCA stent (unknown type)   History of kidney stones    HTN (hypertension)    Hyperlipidemia    Hypothyroidism    Long term current use of antithrombotics/antiplatelets    a.) DAPT therapy (ASA + clopidogrel)   OSA (obstructive sleep apnea)    a.) does not require nocturnal PAP therapy   PLMD (periodic limb movement disorder)    Postoperative atrial fibrillation ([name]) 01/03/2016   a.) following CABG procedure   Prostatitis    PVD (peripheral vascular disease) ([name])    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 b/) residual RIGHT sided weakness   T2DM (type 2 diabetes mellitus) ([name])    Tobacco abuse     Past Surgical History:  Procedure Laterality Date   CARDIAC CATHETERIZATION N/A 12/07/2015   Procedure: Left Heart Cath and Coronary Angiography;  Surgeon: Lamar Blinks, MD;  Location: [facility];  Service: Cardiovascular;  Laterality: N/A;   CAROTID ANGIOGRAPHY N/A 06/05/2022   Procedure: CAROTID ANGIOGRAPHY;  Surgeon: Annice Needy, MD;  Location: [facility];  Service: Cardiovascular;  Laterality: N/A;   CAROTID PTA/STENT INTERVENTION Left    HX: 2 stents   CATARACT EXTRACTION W/PHACO Left 03/24/2021   Procedure: CATARACT EXTRACTION PHACO AND INTRAOCULAR LENS PLACEMENT (IOC) LEFT DIABETIC 6.67 00:42.5;  Surgeon: Galen Manila, MD;  Location: [facility];  Service: Ophthalmology;   Laterality: Left;   COLONOSCOPY WITH PROPOFOL N/A 02/18/2019   Procedure: COLONOSCOPY WITH PROPOFOL;  Surgeon: Midge Minium, MD;  Location: [facility];  Service: Endoscopy;  Laterality: N/A;   COLONOSCOPY WITH PROPOFOL N/A 12/22/2021   Procedure: COLONOSCOPY WITH PROPOFOL;  Surgeon: Midge Minium, MD;  Location: [facility] ENDOSCOPY;  Service: Endoscopy;  Laterality: N/A;   CORONARY ANGIOPLASTY WITH STENT PLACEMENT Left 2002   CORONARY ARTERY BYPASS GRAFT N/A 01/03/2016   Procedure: 3v CORONARY ARTERY BYPASS GRAFT (LIMA-LAD,  SVG-PDA, SVG-OM2); Location: [facility]; Surgeon: Cain Sieve, MD   CYSTOSCOPY W/ URETERAL STENT REMOVAL N/A 10/04/2021   Procedure: CYSTOSCOPY WITH PROSTATE FOREIGN BODY REMOVAL;  Surgeon: Riki Altes, MD;  Location: [facility];  Service: Urology;  Laterality: N/A;   CYSTOSCOPY WITH INSERTION OF UROLIFT N/A 04/29/2019   Procedure: CYSTOSCOPY WITH INSERTION OF UROLIFT;  Surgeon: Riki Altes, MD;  Location: [facility];  Service: Urology;  Laterality: N/A;   ESOPHAGOGASTRODUODENOSCOPY (EGD) WITH PROPOFOL N/A 09/11/2016   Procedure: ESOPHAGOGASTRODUODENOSCOPY (EGD) WITH PROPOFOL;  Surgeon: Midge Minium, MD;  Location: [facility];  Service: Endoscopy;  Laterality: N/A;   ESOPHAGOGASTRODUODENOSCOPY (EGD) WITH PROPOFOL N/A 10/17/2016   Procedure: ESOPHAGOGASTRODUODENOSCOPY (EGD) WITH PROPOFOL;  Surgeon: Midge Minium, MD;  Location: [facility];  Service: Endoscopy;  Laterality: N/A;   ESOPHAGOGASTRODUODENOSCOPY (EGD) WITH PROPOFOL N/A 11/13/2017   Procedure: ESOPHAGOGASTRODUODENOSCOPY (EGD) WITH PROPOFOL;  Surgeon: Midge Minium, MD;  Location: [facility];  Service: Endoscopy;  Laterality: N/A;   pins R lower leg Left    rotator cuff replaced Right    SAVORY DILATION N/A 10/17/2016   Procedure: SAVORY DILATION;  Surgeon: Midge Minium, MD;  Location: [facility];  Service: Endoscopy;  Laterality: N/A;   TRANSURETHRAL INCISION OF PROSTATE N/A 10/04/2021   Procedure:  TRANSURETHRAL INCISION OF THE PROSTATE (TUIP);  Surgeon: Riki Altes, MD;  Location: [facility];  Service: Urology;  Laterality: N/A;     Social History   Tobacco Use   Smoking status: Former    Packs/day: 2    Types: Cigarettes    Quit date: 05/20/2012    Years since quitting: 10.6   Smokeless tobacco: Never  Vaping Use   Vaping Use: Never used  Substance Use Topics   Alcohol use: Not Currently    Comment: No alcohol since stroke   Drug use: Never      Family History  Problem Relation Age of Onset   Heart failure Father    Throat cancer Mother    Diabetes Brother      No Known Allergies   REVIEW OF SYSTEMS (Negative unless checked)   Constitutional: [] Weight loss  [] Fever  [] Chills Cardiac: [] Chest pain   [] Chest pressure   [] Palpitations   [] Shortness of breath when laying flat   [] Shortness of breath at rest   [] Shortness of breath with exertion. Vascular:  [] Pain in legs with walking   [] Pain in legs at rest   [] Pain in legs when laying flat   [] Claudication   [] Pain in feet when walking  [] Pain in feet at rest  [] Pain in feet when laying flat   [] History of DVT   [] Phlebitis   [] Swelling in legs   [] Varicose veins   [] Non-healing ulcers Pulmonary:   [] Uses home oxygen   [] Productive cough   [] Hemoptysis   [] Wheeze  [x] COPD   [] Asthma Neurologic:  [] Dizziness  [] Blackouts   [] Seizures   [x] History of stroke   [] History of TIA  [] Aphasia   [] Temporary blindness   [] Dysphagia   [] Weakness or numbness in arms   [] Weakness or numbness in legs Musculoskeletal:  [x] Arthritis   [] Joint swelling   [] Joint pain   [] Low back pain Hematologic:  [x] Easy bruising  [] Easy bleeding   [] Hypercoagulable state   [x] Anemic   Gastrointestinal:  [] Blood in stool   [] Vomiting blood  [x] Gastroesophageal reflux/heartburn   [] Abdominal pain Genitourinary:  [] Chronic kidney disease   [] Difficult urination  [] Frequent urination  [] Burning with urination   [] Hematuria Skin:  [] Rashes    []   Ulcers   [] Wounds Psychological:  [x] History of anxiety   [x]  History of major depression.   Physical Examination  Vitals:   12/26/22 1027  BP: (!) 151/69  Pulse: (!) 44  Resp: 18  Weight: 213 lb 3.2 oz (96.7 kg)  Height: 5\' 11"  (1.803 m)   Body mass index is 29.74 kg/m. Gen:  WD/WN, NAD Head: [place]/AT, No temporalis wasting. Ear/Nose/Throat: Hearing grossly intact, nares w/o erythema or drainage, trachea midline Eyes: Conjunctiva clear. Sclera non-icteric Neck: Supple.  No bruit  Pulmonary:  Good air movement, equal and clear to auscultation bilaterally.  Cardiac: RRR, No JVD Vascular:  Vessel Right Left  Radial Palpable Palpable           Musculoskeletal: M/S 5/5 throughout.  No deformity or atrophy. No edema. Neurologic: CN 2-12 intact. Sensation grossly intact in extremities.  Symmetrical.  Speech is limited and choppy. Motor exam as listed above. Psychiatric: Judgment intact, Mood & affect appropriate for pt's clinical situation. Dermatologic: No rashes or ulcers noted.  No cellulitis or open wounds.    CBC Lab Results  Component Value Date   WBC 7.5 06/06/2022   HGB 13.2 06/06/2022   HCT 38.2 (L) 06/06/2022   MCV 91.6 06/06/2022   PLT 155 06/06/2022    BMET    Component Value Date/Time   NA 141 06/06/2022 0303   NA 141 05/11/2012 1929   K 3.5 06/06/2022 0303   K 3.9 05/11/2012 1929   CL 111 06/06/2022 0303   CL 107 05/11/2012 1929   CO2 25 06/06/2022 0303   CO2 26 05/11/2012 1929   GLUCOSE 137 (H) 06/06/2022 0303   GLUCOSE 144 (H) 05/11/2012 1929   BUN 13 06/06/2022 0303   BUN 11 05/11/2012 1929   CREATININE 0.87 06/06/2022 0303   CREATININE 0.85 05/11/2012 1929   CALCIUM 8.4 (L) 06/06/2022 0303   CALCIUM 8.5 05/11/2012 1929   GFRNONAA >60 06/06/2022 0303   GFRNONAA >60 05/11/2012 1929   GFRAA >60 07/01/2015 0857   GFRAA >60 05/11/2012 1929   CrCl cannot be calculated (Patient's most recent lab result is older than the maximum 21 days  allowed.).  COAG Lab Results  Component Value Date   INR 1.06 07/01/2015   INR 1.03 09/20/2012   INR 0.9 05/11/2012    Radiology No results found.   Assessment/Plan Bilateral carotid artery stenosis His duplex today shows stable 1 to 39% right ICA stenosis.  He has velocities by normal criteria that would fall in the 40 to 59% range for left carotid stenosis, but with a stent in place, this would just indicate less than 50% stenosis within the previously placed stent.  Continue current medical regimen.  Recheck in 6 months with duplex.  Benign essential hypertension blood pressure control important in reducing the progression of atherosclerotic disease. On appropriate oral medications.     Type 2 diabetes mellitus without complications ([name]) blood glucose control important in reducing the progression of atherosclerotic disease. Also, involved in wound healing. On appropriate medications.     HLD (hyperlipidemia) lipid control important in reducing the progression of atherosclerotic disease. Continue statin therapy  Festus Barren, MD  12/26/2022 11:39 AM    This note was created with Dragon medical transcription system.  Any errors from dictation are purely unintentional

## 2022-12-26 NOTE — Assessment & Plan Note (Signed)
His duplex today shows stable 1 to 39% right ICA stenosis.  He has velocities by normal criteria that would fall in the 40 to 59% range for left carotid stenosis, but with a stent in place, this would just indicate less than 50% stenosis within the previously placed stent.  Continue current medical regimen.  Recheck in 6 months with duplex.

## 2023-02-02 ENCOUNTER — Other Ambulatory Visit: Payer: Self-pay | Admitting: Gastroenterology

## 2023-02-05 ENCOUNTER — Encounter: Payer: Self-pay | Admitting: Gastroenterology

## 2023-02-05 ENCOUNTER — Ambulatory Visit: Payer: Medicare HMO | Admitting: Gastroenterology

## 2023-02-05 VITALS — BP 133/71 | HR 53 | Temp 97.8°F | Wt 209.0 lb

## 2023-02-05 DIAGNOSIS — K219 Gastro-esophageal reflux disease without esophagitis: Secondary | ICD-10-CM

## 2023-02-05 MED ORDER — PANTOPRAZOLE SODIUM 40 MG PO TBEC: DELAYED_RELEASE_TABLET | ORAL | 3 refills | Status: DC

## 2023-02-05 NOTE — Progress Notes (Signed)
Primary Care Physician: Myrene Buddy, NP  Primary Gastroenterologist:  Dr. Midge Minium  Chief Complaint  Patient presents with   Follow-up   Gastroesophageal Reflux   Medication Refill    HPI: Paul Bass is a 77 y.o. male here for follow up of GERD and medication refill.  The patient reports that he is doing very well without any acid breakthrough.  He denies any dysphagia unexplained weight loss fevers chills nausea vomiting black stools or bloody stools.  The patient states he has been doing well on the medication.  We reviewed the patient's previous colonoscopy with multiple polyps that due to his age was recommended not to have any further colonoscopies unless he is having problems.  Past Medical History:  Diagnosis Date   Accelerated junctional rhythm    Anemia    Anxiety    a.) Tx'd with BZO PRN   Arthritis    Bilateral carotid artery disease ([name])    BPH (benign prostatic hypertrophy)    CAD S/P percutaneous coronary angioplasty    a.) PCI in 2002 placing a RCA stent (unknown type). b.) LHC 12/07/2015: 5% ISR m-dRCA, 90% dRCA, 25% pRCA, 25% p-mLCx, 100% OM3, 95% oOM2-OM2, 75% m-dLAD; refer to CVTS. c.) 3v CABG 01/03/2016   CHF (congestive heart failure) ([name])    a.) TTE 05/12/2012: EF 50-55%; mild LA enlargement; G1DD. b.)  TTE 11/26/2015: EF 45%; mild BAE; triv PR, mild MR/TR; inferolateral HK; G1DD. c.)  TTE 11/07/2017: EF 35%; RV enlargement; BAE; inferior and inferolateral HK; triv PR; mild MR/TR. d.)  TTE 09/17/2019: EF 35%; mild LVH; triv MR/TR. e.)  TTE 08/11/2021: EF 50%; triv MR/TR; G1DD.   Cognitive deficit following cerebrovascular accident (CVA)    COPD (chronic obstructive pulmonary disease) ([name])    Cortical cataract    Depression    Dyspnea    GERD (gastroesophageal reflux disease)    History of 2019 novel coronavirus disease (COVID-19) 01/19/2021   History of acute inferior wall MI 2002   a.) PCI was performed placing a RCA stent (unknown  type)   History of kidney stones    HTN (hypertension)    Hyperlipidemia    Hypothyroidism    Long term current use of antithrombotics/antiplatelets    a.) DAPT therapy (ASA + clopidogrel)   OSA (obstructive sleep apnea)    a.) does not require nocturnal PAP therapy   PLMD (periodic limb movement disorder)    Postoperative atrial fibrillation ([name]) 01/03/2016   a.) following CABG procedure   Prostatitis    PVD (peripheral vascular disease) ([name])    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 b/) residual RIGHT sided weakness   T2DM (type 2 diabetes mellitus) ([name])    Tobacco abuse     Current Outpatient Medications  Medication Sig Dispense Refill   albuterol (VENTOLIN HFA) 108 (90 Base) MCG/ACT inhaler SMARTSIG:2 inhalation Via Inhaler Every 4 Hours PRN     aspirin EC 81 MG tablet Take 1 tablet (81 mg total) by mouth daily at 6 (six) AM. Swallow whole. 30 tablet 12   atorvastatin (LIPITOR) 80 MG tablet Take 80 mg by mouth daily.      buPROPion (WELLBUTRIN XL) 150 MG 24 hr tablet Take 150 mg by mouth  at bedtime.     cholecalciferol (VITAMIN D3) 25 MCG (1000 UT) tablet Take 1,000 Units by mouth every evening.     clonazePAM (KLONOPIN) 0.5 MG tablet Take 0.5 mg by mouth 2 (two) times daily.     clopidogrel (PLAVIX) 75 MG tablet Take 75 mg by mouth daily.   1   Coenzyme Q10 (COQ10) 100 MG CAPS Take 100 mg by mouth at bedtime.     Ferrous Sulfate (IRON) 325 (65 Fe) MG TABS Take 325 mg by mouth daily.     finasteride (PROSCAR) 5 MG tablet Take 5 mg by mouth daily.     fluticasone-salmeterol (ADVAIR) 500-50 MCG/ACT AEPB Inhale into the lungs.     glucose blood test strip      levofloxacin (LEVAQUIN) 500 MG tablet Take 500 mg by mouth daily.      levothyroxine (SYNTHROID, LEVOTHROID) 100 MCG tablet Take 100 mcg by mouth daily before breakfast.     loratadine (CLARITIN) 10 MG tablet Take 10 mg by mouth daily.     metFORMIN (GLUCOPHAGE) 500 MG tablet Take 500 mg by mouth 2 (two) times daily.      metoprolol tartrate (LOPRESSOR) 25 MG tablet Take 25 mg by mouth 2 (two) times daily.     Multiple Vitamin (MULTIVITAMIN WITH MINERALS) TABS Take 1 tablet by mouth daily.     naproxen sodium (ALEVE) 220 MG tablet Take 220-440 mg by mouth 2 (two) times daily as needed (pain.).     oxyCODONE-acetaminophen (PERCOCET/ROXICET) 5-325 MG tablet Take 1 tablet by mouth every 6 (six) hours as needed for moderate pain. 20 tablet 0   pantoprazole (PROTONIX) 40 MG tablet TAKE 1 TABLET BY MOUTH EVERYDAY AT BEDTIME 90 tablet 0   psyllium (METAMUCIL SMOOTH TEXTURE) 28 % packet Take 1 packet by mouth daily.     RA KRILL OIL 500 MG CAPS Take 500 mg by mouth at bedtime.      tamsulosin (FLOMAX) 0.4 MG CAPS capsule TAKE 1 CAPSULE BY MOUTH EVERY DAY 90 capsule 1   venlafaxine XR (EFFEXOR-XR) 150 MG 24 hr capsule Take 150 mg by mouth daily.     No current facility-administered medications for this visit.    Allergies as of 02/05/2023   (No Known Allergies)    ROS:  General: Negative for anorexia, weight loss, fever, chills, fatigue, weakness. ENT: Negative for hoarseness, difficulty swallowing , nasal congestion. CV: Negative for chest pain, angina, palpitations, dyspnea on exertion, peripheral edema.  Respiratory: Negative for dyspnea at rest, dyspnea on exertion, cough, sputum, wheezing.  GI: See history of present illness. GU:  Negative for dysuria, hematuria, urinary incontinence, urinary frequency, nocturnal urination.  Endo: Negative for unusual weight change.    Physical Examination:   BP 133/71 (BP Location: Left Arm, Patient Position: Sitting, Cuff Size: Normal)   Pulse (!) 53   Temp 97.8 F (36.6 C) (Oral)   Wt 209 lb (94.8 kg)   BMI 29.15  kg/m   General: Well-nourished, well-developed in no acute distress.  Eyes: No icterus. Conjunctivae pink. Lungs: Clear to auscultation bilaterally. Non-labored. Heart: Regular rate and rhythm, no murmurs rubs or gallops.  Abdomen: Bowel sounds are normal, nontender, nondistended, no hepatosplenomegaly or masses, no abdominal bruits or hernia , no rebound or guarding.   Extremities: No lower extremity edema. No clubbing or deformities. Neuro: Alert and oriented x 3.  Grossly intact. Skin: Warm and dry, no jaundice.   Psych: Alert and cooperative, normal mood and affect.  Labs:  Imaging Studies: No results found.  Assessment and Plan:   Paul Bass is a 77 y.o. y/o male who comes in today with a history of GERD doing well on his proton pump inhibitor at the present time.  The patient will have his proton pump inhibitor refilled.  The patient has been explained the plan and agrees with it.  He will follow-up as needed.     Midge Minium, MD. Clementeen Graham    Note: This dictation was prepared with Dragon dictation along with smaller phrase technology. Any transcriptional errors that result from this process are unintentional.

## 2023-02-08 DIAGNOSIS — R3915 Urgency of urination: Secondary | ICD-10-CM | POA: Diagnosis not present

## 2023-02-08 DIAGNOSIS — N401 Enlarged prostate with lower urinary tract symptoms: Secondary | ICD-10-CM | POA: Diagnosis not present

## 2023-03-07 DIAGNOSIS — L2089 Other atopic dermatitis: Secondary | ICD-10-CM | POA: Diagnosis not present

## 2023-03-07 DIAGNOSIS — L308 Other specified dermatitis: Secondary | ICD-10-CM | POA: Diagnosis not present

## 2023-03-07 DIAGNOSIS — R21 Rash and other nonspecific skin eruption: Secondary | ICD-10-CM | POA: Diagnosis not present

## 2023-03-20 DIAGNOSIS — Z1331 Encounter for screening for depression: Secondary | ICD-10-CM | POA: Diagnosis not present

## 2023-03-20 DIAGNOSIS — Z87440 Personal history of urinary (tract) infections: Secondary | ICD-10-CM | POA: Diagnosis not present

## 2023-03-20 DIAGNOSIS — J449 Chronic obstructive pulmonary disease, unspecified: Secondary | ICD-10-CM | POA: Diagnosis not present

## 2023-03-20 DIAGNOSIS — Z79899 Other long term (current) drug therapy: Secondary | ICD-10-CM | POA: Diagnosis not present

## 2023-03-20 DIAGNOSIS — F418 Other specified anxiety disorders: Secondary | ICD-10-CM | POA: Diagnosis not present

## 2023-03-20 DIAGNOSIS — E1151 Type 2 diabetes mellitus with diabetic peripheral angiopathy without gangrene: Secondary | ICD-10-CM | POA: Diagnosis not present

## 2023-03-20 DIAGNOSIS — G4761 Periodic limb movement disorder: Secondary | ICD-10-CM | POA: Diagnosis not present

## 2023-03-20 DIAGNOSIS — I110 Hypertensive heart disease with heart failure: Secondary | ICD-10-CM | POA: Diagnosis not present

## 2023-03-20 DIAGNOSIS — E039 Hypothyroidism, unspecified: Secondary | ICD-10-CM | POA: Diagnosis not present

## 2023-03-20 DIAGNOSIS — Z0000 Encounter for general adult medical examination without abnormal findings: Secondary | ICD-10-CM | POA: Diagnosis not present

## 2023-03-20 DIAGNOSIS — E782 Mixed hyperlipidemia: Secondary | ICD-10-CM | POA: Diagnosis not present

## 2023-03-20 DIAGNOSIS — E1159 Type 2 diabetes mellitus with other circulatory complications: Secondary | ICD-10-CM | POA: Diagnosis not present

## 2023-03-20 DIAGNOSIS — I2510 Atherosclerotic heart disease of native coronary artery without angina pectoris: Secondary | ICD-10-CM | POA: Diagnosis not present

## 2023-03-20 DIAGNOSIS — E538 Deficiency of other specified B group vitamins: Secondary | ICD-10-CM | POA: Diagnosis not present

## 2023-03-20 DIAGNOSIS — I69319 Unspecified symptoms and signs involving cognitive functions following cerebral infarction: Secondary | ICD-10-CM | POA: Diagnosis not present

## 2023-03-20 DIAGNOSIS — I10 Essential (primary) hypertension: Secondary | ICD-10-CM | POA: Diagnosis not present

## 2023-03-26 DIAGNOSIS — L2089 Other atopic dermatitis: Secondary | ICD-10-CM | POA: Diagnosis not present

## 2023-03-26 DIAGNOSIS — L918 Other hypertrophic disorders of the skin: Secondary | ICD-10-CM | POA: Diagnosis not present

## 2023-04-12 DIAGNOSIS — R3915 Urgency of urination: Secondary | ICD-10-CM | POA: Diagnosis not present

## 2023-04-12 DIAGNOSIS — R319 Hematuria, unspecified: Secondary | ICD-10-CM | POA: Diagnosis not present

## 2023-04-12 DIAGNOSIS — N390 Urinary tract infection, site not specified: Secondary | ICD-10-CM | POA: Diagnosis not present

## 2023-04-12 DIAGNOSIS — R338 Other retention of urine: Secondary | ICD-10-CM | POA: Diagnosis not present

## 2023-04-12 DIAGNOSIS — N401 Enlarged prostate with lower urinary tract symptoms: Secondary | ICD-10-CM | POA: Diagnosis not present

## 2023-04-12 DIAGNOSIS — N35014 Post-traumatic urethral stricture, male, unspecified: Secondary | ICD-10-CM | POA: Diagnosis not present

## 2023-04-17 DIAGNOSIS — L299 Pruritus, unspecified: Secondary | ICD-10-CM | POA: Diagnosis not present

## 2023-04-17 DIAGNOSIS — L239 Allergic contact dermatitis, unspecified cause: Secondary | ICD-10-CM | POA: Diagnosis not present

## 2023-04-25 DIAGNOSIS — I739 Peripheral vascular disease, unspecified: Secondary | ICD-10-CM | POA: Diagnosis not present

## 2023-04-25 DIAGNOSIS — Z136 Encounter for screening for cardiovascular disorders: Secondary | ICD-10-CM | POA: Diagnosis not present

## 2023-04-25 DIAGNOSIS — I2510 Atherosclerotic heart disease of native coronary artery without angina pectoris: Secondary | ICD-10-CM | POA: Diagnosis not present

## 2023-05-10 DIAGNOSIS — Z23 Encounter for immunization: Secondary | ICD-10-CM | POA: Diagnosis not present

## 2023-05-12 ENCOUNTER — Emergency Department: Payer: Medicare HMO

## 2023-05-12 ENCOUNTER — Inpatient Hospital Stay (HOSPITAL_COMMUNITY)
Admit: 2023-05-12 | Discharge: 2023-05-12 | Disposition: A | Discharge status: Home / Self Care | Payer: Medicare HMO | Attending: Family Medicine | Admitting: Family Medicine

## 2023-05-12 ENCOUNTER — Other Ambulatory Visit: Payer: Self-pay

## 2023-05-12 ENCOUNTER — Inpatient Hospital Stay
Admission: EM | Admit: 2023-05-12 | Discharge: 2023-05-15 | DRG: 637 | Disposition: A | Discharge status: Home / Self Care | Payer: Medicare HMO | Attending: Student in an Organized Health Care Education/Training Program | Admitting: Student in an Organized Health Care Education/Training Program

## 2023-05-12 ENCOUNTER — Inpatient Hospital Stay: Payer: Medicare HMO

## 2023-05-12 DIAGNOSIS — R531 Weakness: Secondary | ICD-10-CM | POA: Diagnosis not present

## 2023-05-12 DIAGNOSIS — N50812 Left testicular pain: Secondary | ICD-10-CM | POA: Diagnosis not present

## 2023-05-12 DIAGNOSIS — I69351 Hemiplegia and hemiparesis following cerebral infarction affecting right dominant side: Secondary | ICD-10-CM

## 2023-05-12 DIAGNOSIS — G9389 Other specified disorders of brain: Secondary | ICD-10-CM | POA: Diagnosis present

## 2023-05-12 DIAGNOSIS — L03314 Cellulitis of groin: Principal | ICD-10-CM | POA: Diagnosis present

## 2023-05-12 DIAGNOSIS — I459 Conduction disorder, unspecified: Secondary | ICD-10-CM | POA: Diagnosis present

## 2023-05-12 DIAGNOSIS — I700 Atherosclerosis of aorta: Secondary | ICD-10-CM | POA: Diagnosis not present

## 2023-05-12 DIAGNOSIS — A498 Other bacterial infections of unspecified site: Secondary | ICD-10-CM | POA: Diagnosis not present

## 2023-05-12 DIAGNOSIS — I252 Old myocardial infarction: Secondary | ICD-10-CM

## 2023-05-12 DIAGNOSIS — I110 Hypertensive heart disease with heart failure: Secondary | ICD-10-CM | POA: Diagnosis present

## 2023-05-12 DIAGNOSIS — N400 Enlarged prostate without lower urinary tract symptoms: Secondary | ICD-10-CM

## 2023-05-12 DIAGNOSIS — Z7902 Long term (current) use of antithrombotics/antiplatelets: Secondary | ICD-10-CM

## 2023-05-12 DIAGNOSIS — E11628 Type 2 diabetes mellitus with other skin complications: Principal | ICD-10-CM | POA: Diagnosis present

## 2023-05-12 DIAGNOSIS — W19XXXA Unspecified fall, initial encounter: Secondary | ICD-10-CM | POA: Diagnosis not present

## 2023-05-12 DIAGNOSIS — A419 Sepsis, unspecified organism: Secondary | ICD-10-CM | POA: Diagnosis not present

## 2023-05-12 DIAGNOSIS — Z8249 Family history of ischemic heart disease and other diseases of the circulatory system: Secondary | ICD-10-CM

## 2023-05-12 DIAGNOSIS — Z87442 Personal history of urinary calculi: Secondary | ICD-10-CM

## 2023-05-12 DIAGNOSIS — B965 Pseudomonas (aeruginosa) (mallei) (pseudomallei) as the cause of diseases classified elsewhere: Secondary | ICD-10-CM | POA: Diagnosis not present

## 2023-05-12 DIAGNOSIS — Z8616 Personal history of COVID-19: Secondary | ICD-10-CM

## 2023-05-12 DIAGNOSIS — Z79890 Hormone replacement therapy (postmenopausal): Secondary | ICD-10-CM

## 2023-05-12 DIAGNOSIS — N401 Enlarged prostate with lower urinary tract symptoms: Secondary | ICD-10-CM | POA: Diagnosis present

## 2023-05-12 DIAGNOSIS — N35912 Unspecified bulbous urethral stricture, male: Secondary | ICD-10-CM | POA: Diagnosis present

## 2023-05-12 DIAGNOSIS — I672 Cerebral atherosclerosis: Secondary | ICD-10-CM | POA: Diagnosis not present

## 2023-05-12 DIAGNOSIS — E1151 Type 2 diabetes mellitus with diabetic peripheral angiopathy without gangrene: Secondary | ICD-10-CM | POA: Diagnosis present

## 2023-05-12 DIAGNOSIS — K449 Diaphragmatic hernia without obstruction or gangrene: Secondary | ICD-10-CM | POA: Diagnosis not present

## 2023-05-12 DIAGNOSIS — I861 Scrotal varices: Secondary | ICD-10-CM | POA: Diagnosis present

## 2023-05-12 DIAGNOSIS — I480 Paroxysmal atrial fibrillation: Secondary | ICD-10-CM | POA: Diagnosis present

## 2023-05-12 DIAGNOSIS — I639 Cerebral infarction, unspecified: Secondary | ICD-10-CM | POA: Diagnosis not present

## 2023-05-12 DIAGNOSIS — Z951 Presence of aortocoronary bypass graft: Secondary | ICD-10-CM | POA: Diagnosis not present

## 2023-05-12 DIAGNOSIS — K219 Gastro-esophageal reflux disease without esophagitis: Secondary | ICD-10-CM | POA: Diagnosis present

## 2023-05-12 DIAGNOSIS — L0390 Cellulitis, unspecified: Secondary | ICD-10-CM | POA: Diagnosis present

## 2023-05-12 DIAGNOSIS — I5042 Chronic combined systolic (congestive) and diastolic (congestive) heart failure: Secondary | ICD-10-CM | POA: Diagnosis not present

## 2023-05-12 DIAGNOSIS — Z87891 Personal history of nicotine dependence: Secondary | ICD-10-CM

## 2023-05-12 DIAGNOSIS — N492 Inflammatory disorders of scrotum: Secondary | ICD-10-CM | POA: Diagnosis present

## 2023-05-12 DIAGNOSIS — I6389 Other cerebral infarction: Secondary | ICD-10-CM | POA: Diagnosis present

## 2023-05-12 DIAGNOSIS — J449 Chronic obstructive pulmonary disease, unspecified: Secondary | ICD-10-CM | POA: Diagnosis not present

## 2023-05-12 DIAGNOSIS — Z833 Family history of diabetes mellitus: Secondary | ICD-10-CM

## 2023-05-12 DIAGNOSIS — Z808 Family history of malignant neoplasm of other organs or systems: Secondary | ICD-10-CM

## 2023-05-12 DIAGNOSIS — Z7984 Long term (current) use of oral hypoglycemic drugs: Secondary | ICD-10-CM

## 2023-05-12 DIAGNOSIS — N3000 Acute cystitis without hematuria: Secondary | ICD-10-CM | POA: Diagnosis present

## 2023-05-12 DIAGNOSIS — R519 Headache, unspecified: Secondary | ICD-10-CM | POA: Diagnosis not present

## 2023-05-12 DIAGNOSIS — N451 Epididymitis: Secondary | ICD-10-CM | POA: Diagnosis not present

## 2023-05-12 DIAGNOSIS — Z1152 Encounter for screening for COVID-19: Secondary | ICD-10-CM

## 2023-05-12 DIAGNOSIS — R29818 Other symptoms and signs involving the nervous system: Secondary | ICD-10-CM | POA: Diagnosis not present

## 2023-05-12 DIAGNOSIS — N5089 Other specified disorders of the male genital organs: Secondary | ICD-10-CM | POA: Diagnosis not present

## 2023-05-12 DIAGNOSIS — E039 Hypothyroidism, unspecified: Secondary | ICD-10-CM | POA: Diagnosis present

## 2023-05-12 DIAGNOSIS — N433 Hydrocele, unspecified: Secondary | ICD-10-CM | POA: Diagnosis present

## 2023-05-12 DIAGNOSIS — N50819 Testicular pain, unspecified: Secondary | ICD-10-CM | POA: Diagnosis not present

## 2023-05-12 DIAGNOSIS — G459 Transient cerebral ischemic attack, unspecified: Secondary | ICD-10-CM | POA: Diagnosis not present

## 2023-05-12 DIAGNOSIS — E785 Hyperlipidemia, unspecified: Secondary | ICD-10-CM | POA: Diagnosis present

## 2023-05-12 DIAGNOSIS — Z7982 Long term (current) use of aspirin: Secondary | ICD-10-CM

## 2023-05-12 DIAGNOSIS — I2510 Atherosclerotic heart disease of native coronary artery without angina pectoris: Secondary | ICD-10-CM | POA: Diagnosis present

## 2023-05-12 DIAGNOSIS — E119 Type 2 diabetes mellitus without complications: Secondary | ICD-10-CM

## 2023-05-12 DIAGNOSIS — Z7951 Long term (current) use of inhaled steroids: Secondary | ICD-10-CM

## 2023-05-12 DIAGNOSIS — N390 Urinary tract infection, site not specified: Secondary | ICD-10-CM

## 2023-05-12 DIAGNOSIS — R3914 Feeling of incomplete bladder emptying: Secondary | ICD-10-CM | POA: Diagnosis present

## 2023-05-12 DIAGNOSIS — I5022 Chronic systolic (congestive) heart failure: Secondary | ICD-10-CM | POA: Diagnosis present

## 2023-05-12 DIAGNOSIS — R82998 Other abnormal findings in urine: Secondary | ICD-10-CM | POA: Diagnosis not present

## 2023-05-12 DIAGNOSIS — Z955 Presence of coronary angioplasty implant and graft: Secondary | ICD-10-CM

## 2023-05-12 DIAGNOSIS — I6523 Occlusion and stenosis of bilateral carotid arteries: Secondary | ICD-10-CM | POA: Diagnosis not present

## 2023-05-12 DIAGNOSIS — Z79899 Other long term (current) drug therapy: Secondary | ICD-10-CM

## 2023-05-12 LAB — CBC
HCT: 39.9 % (ref 39.0–52.0)
Hemoglobin: 13.9 g/dL (ref 13.0–17.0)
MCH: 32.5 pg (ref 26.0–34.0)
MCHC: 34.8 g/dL (ref 30.0–36.0)
MCV: 93.2 fL (ref 80.0–100.0)
Platelets: 160 10*3/uL (ref 150–400)
RBC: 4.28 MIL/uL (ref 4.22–5.81)
RDW: 12.8 % (ref 11.5–15.5)
WBC: 14.9 10*3/uL — ABNORMAL HIGH (ref 4.0–10.5)
nRBC: 0 % (ref 0.0–0.2)

## 2023-05-12 LAB — PROTIME-INR
INR: 1.2 (ref 0.8–1.2)
Prothrombin Time: 15.1 s (ref 11.4–15.2)

## 2023-05-12 LAB — COMPREHENSIVE METABOLIC PANEL
ALT: 16 U/L (ref 0–44)
AST: 16 U/L (ref 15–41)
Albumin: 3.7 g/dL (ref 3.5–5.0)
Alkaline Phosphatase: 83 U/L (ref 38–126)
Anion gap: 9 (ref 5–15)
BUN: 18 mg/dL (ref 8–23)
CO2: 24 mmol/L (ref 22–32)
Calcium: 8.6 mg/dL — ABNORMAL LOW (ref 8.9–10.3)
Chloride: 102 mmol/L (ref 98–111)
Creatinine, Ser: 1.04 mg/dL (ref 0.61–1.24)
GFR, Estimated: >60 mL/min (ref >60)
Glucose, Bld: 147 mg/dL — ABNORMAL HIGH (ref 70–99)
Potassium: 3.8 mmol/L (ref 3.5–5.1)
Sodium: 135 mmol/L (ref 135–145)
Total Bilirubin: 0.9 mg/dL (ref 0.3–1.2)
Total Protein: 7.2 g/dL (ref 6.5–8.1)

## 2023-05-12 LAB — URINALYSIS, ROUTINE W REFLEX MICROSCOPIC
Bilirubin Urine: NEGATIVE
Glucose, UA: NEGATIVE mg/dL
Hgb urine dipstick: NEGATIVE
Ketones, ur: NEGATIVE mg/dL
Nitrite: NEGATIVE
Protein, ur: NEGATIVE mg/dL
RBC / HPF: 0 RBC/hpf (ref 0–5)
Specific Gravity, Urine: 1.011 (ref 1.005–1.030)
WBC, UA: >50 WBC/hpf (ref 0–5)
pH: 6 (ref 5.0–8.0)

## 2023-05-12 LAB — RESP PANEL BY RT-PCR (RSV, FLU A&B, COVID)  RVPGX2
Influenza A by PCR: NEGATIVE
Influenza B by PCR: NEGATIVE
Resp Syncytial Virus by PCR: NEGATIVE
SARS Coronavirus 2 by RT PCR: NEGATIVE

## 2023-05-12 LAB — GLUCOSE, CAPILLARY: Glucose-Capillary: 160 mg/dL — ABNORMAL HIGH (ref 70–99)

## 2023-05-12 LAB — ECHOCARDIOGRAM COMPLETE
AR max vel: 2.2 cm2
AV Peak grad: 13.8 mm[Hg]
Ao pk vel: 1.86 m/s
Area-P 1/2: 1.49 cm2
Height: 71 in
S' Lateral: 5.1 cm
Single Plane A4C EF: 52 %
Weight: 3392 [oz_av]

## 2023-05-12 LAB — CBG MONITORING, ED
Glucose-Capillary: 106 mg/dL — ABNORMAL HIGH (ref 70–99)
Glucose-Capillary: 178 mg/dL — ABNORMAL HIGH (ref 70–99)

## 2023-05-12 LAB — LACTIC ACID, PLASMA: Lactic Acid, Venous: 1.5 mmol/L (ref 0.5–1.9)

## 2023-05-12 LAB — APTT: aPTT: 24 s (ref 24–36)

## 2023-05-12 LAB — TROPONIN I (HIGH SENSITIVITY)
Troponin I (High Sensitivity): 19 ng/L — ABNORMAL HIGH (ref <18)
Troponin I (High Sensitivity): 28 ng/L — ABNORMAL HIGH (ref <18)

## 2023-05-12 LAB — HEMOGLOBIN A1C
Hgb A1c MFr Bld: 6.1 % — ABNORMAL HIGH (ref 4.8–5.6)
Mean Plasma Glucose: 128.37 mg/dL

## 2023-05-12 MED ORDER — PERFLUTREN LIPID MICROSPHERE
1.0000 mL | INTRAVENOUS | Status: AC | PRN
  Administered 2023-05-12: 4 mL via INTRAVENOUS

## 2023-05-12 MED ORDER — FINASTERIDE 5 MG PO TABS
5.0000 mg | ORAL_TABLET | Freq: Every day | ORAL | Status: DC
  Administered 2023-05-12 – 2023-05-15 (×4): 5 mg via ORAL
  Filled 2023-05-12 (×4): qty 1

## 2023-05-12 MED ORDER — LEVOTHYROXINE SODIUM 100 MCG PO TABS
100.0000 ug | ORAL_TABLET | Freq: Every day | ORAL | Status: DC
  Administered 2023-05-13 – 2023-05-15 (×3): 100 ug via ORAL
  Filled 2023-05-12 (×3): qty 1

## 2023-05-12 MED ORDER — SODIUM CHLORIDE 0.9 % IV SOLN
2.0000 g | INTRAVENOUS | Status: DC
  Administered 2023-05-12 – 2023-05-13 (×2): 2 g via INTRAVENOUS
  Filled 2023-05-12 (×3): qty 20

## 2023-05-12 MED ORDER — VENLAFAXINE HCL ER 75 MG PO CP24
150.0000 mg | ORAL_CAPSULE | Freq: Every day | ORAL | Status: DC
  Administered 2023-05-12 – 2023-05-15 (×3): 150 mg via ORAL
  Filled 2023-05-12 (×4): qty 2

## 2023-05-12 MED ORDER — CLOPIDOGREL BISULFATE 75 MG PO TABS
75.0000 mg | ORAL_TABLET | Freq: Every day | ORAL | Status: DC
  Administered 2023-05-12 – 2023-05-15 (×4): 75 mg via ORAL
  Filled 2023-05-12 (×4): qty 1

## 2023-05-12 MED ORDER — MICONAZOLE NITRATE 2 % EX CREA
TOPICAL_CREAM | Freq: Two times a day (BID) | CUTANEOUS | Status: DC
  Administered 2023-05-12: 1 via TOPICAL
  Filled 2023-05-12: qty 14

## 2023-05-12 MED ORDER — VANCOMYCIN HCL 2000 MG/400ML IV SOLN
2000.0000 mg | Freq: Once | INTRAVENOUS | Status: AC
  Administered 2023-05-12: 2000 mg via INTRAVENOUS
  Filled 2023-05-12: qty 400

## 2023-05-12 MED ORDER — VANCOMYCIN HCL IN DEXTROSE 1-5 GM/200ML-% IV SOLN
1000.0000 mg | Freq: Once | INTRAVENOUS | Status: DC
  Filled 2023-05-12: qty 200

## 2023-05-12 MED ORDER — ACETAMINOPHEN 500 MG PO TABS
1000.0000 mg | ORAL_TABLET | Freq: Once | ORAL | Status: AC
  Administered 2023-05-12: 1000 mg via ORAL
  Filled 2023-05-12: qty 2

## 2023-05-12 MED ORDER — ONDANSETRON HCL 4 MG/2ML IJ SOLN
4.0000 mg | Freq: Once | INTRAMUSCULAR | Status: AC
  Administered 2023-05-12: 4 mg via INTRAVENOUS
  Filled 2023-05-12: qty 2

## 2023-05-12 MED ORDER — LACTATED RINGERS IV BOLUS (SEPSIS)
1000.0000 mL | Freq: Once | INTRAVENOUS | Status: AC
  Administered 2023-05-12: 1000 mL via INTRAVENOUS

## 2023-05-12 MED ORDER — INSULIN ASPART 100 UNIT/ML IJ SOLN
0.0000 [IU] | Freq: Three times a day (TID) | INTRAMUSCULAR | Status: DC
  Administered 2023-05-12: 2 [IU] via SUBCUTANEOUS
  Administered 2023-05-13: 1 [IU] via SUBCUTANEOUS
  Administered 2023-05-14: 2 [IU] via SUBCUTANEOUS
  Administered 2023-05-14: 1 [IU] via SUBCUTANEOUS
  Administered 2023-05-14 – 2023-05-15 (×2): 2 [IU] via SUBCUTANEOUS
  Filled 2023-05-12 (×6): qty 1

## 2023-05-12 MED ORDER — STROKE: EARLY STAGES OF RECOVERY BOOK: Freq: Once | Status: AC

## 2023-05-12 MED ORDER — FENTANYL CITRATE PF 50 MCG/ML IJ SOSY
50.0000 ug | PREFILLED_SYRINGE | Freq: Once | INTRAMUSCULAR | Status: AC
  Administered 2023-05-12: 50 ug via INTRAVENOUS
  Filled 2023-05-12: qty 1

## 2023-05-12 MED ORDER — INSULIN ASPART 100 UNIT/ML IJ SOLN
3.0000 [IU] | Freq: Three times a day (TID) | INTRAMUSCULAR | Status: DC
  Administered 2023-05-12 – 2023-05-15 (×8): 3 [IU] via SUBCUTANEOUS
  Filled 2023-05-12 (×8): qty 1

## 2023-05-12 MED ORDER — SODIUM CHLORIDE 0.9 % IV SOLN
2.0000 g | Freq: Once | INTRAVENOUS | Status: AC
  Administered 2023-05-12: 2 g via INTRAVENOUS
  Filled 2023-05-12 (×2): qty 12.5

## 2023-05-12 MED ORDER — GABAPENTIN 300 MG PO CAPS
300.0000 mg | ORAL_CAPSULE | Freq: Every day | ORAL | Status: DC
  Administered 2023-05-12 – 2023-05-15 (×4): 300 mg via ORAL
  Filled 2023-05-12 (×4): qty 1

## 2023-05-12 MED ORDER — ATORVASTATIN CALCIUM 20 MG PO TABS
80.0000 mg | ORAL_TABLET | Freq: Every day | ORAL | Status: DC
  Administered 2023-05-12 – 2023-05-15 (×4): 80 mg via ORAL
  Filled 2023-05-12 (×4): qty 4

## 2023-05-12 MED ORDER — METRONIDAZOLE 500 MG/100ML IV SOLN
500.0000 mg | Freq: Two times a day (BID) | INTRAVENOUS | Status: DC
  Administered 2023-05-12 – 2023-05-15 (×6): 500 mg via INTRAVENOUS
  Filled 2023-05-12 (×7): qty 100

## 2023-05-12 MED ORDER — TAMSULOSIN HCL 0.4 MG PO CAPS
0.4000 mg | ORAL_CAPSULE | Freq: Every day | ORAL | Status: DC
  Administered 2023-05-12 – 2023-05-15 (×4): 0.4 mg via ORAL
  Filled 2023-05-12 (×4): qty 1

## 2023-05-12 MED ORDER — ENOXAPARIN SODIUM 40 MG/0.4ML IJ SOSY
40.0000 mg | PREFILLED_SYRINGE | INTRAMUSCULAR | Status: DC
  Administered 2023-05-12 – 2023-05-14 (×3): 40 mg via SUBCUTANEOUS
  Filled 2023-05-12 (×3): qty 0.4

## 2023-05-12 MED ORDER — PANTOPRAZOLE SODIUM 40 MG PO TBEC
40.0000 mg | DELAYED_RELEASE_TABLET | Freq: Every day | ORAL | Status: DC
  Administered 2023-05-12 – 2023-05-15 (×4): 40 mg via ORAL
  Filled 2023-05-12 (×4): qty 1

## 2023-05-12 MED ORDER — METOPROLOL TARTRATE 25 MG PO TABS
25.0000 mg | ORAL_TABLET | Freq: Two times a day (BID) | ORAL | Status: DC
  Administered 2023-05-12 – 2023-05-15 (×5): 25 mg via ORAL
  Filled 2023-05-12 (×6): qty 1

## 2023-05-12 MED ORDER — METRONIDAZOLE 500 MG/100ML IV SOLN
500.0000 mg | Freq: Once | INTRAVENOUS | Status: AC
  Administered 2023-05-12: 500 mg via INTRAVENOUS
  Filled 2023-05-12: qty 100

## 2023-05-12 MED ORDER — ASPIRIN 81 MG PO TBEC
81.0000 mg | DELAYED_RELEASE_TABLET | Freq: Every day | ORAL | Status: DC
  Administered 2023-05-12 – 2023-05-15 (×3): 81 mg via ORAL
  Filled 2023-05-12 (×3): qty 1

## 2023-05-12 MED ORDER — VANCOMYCIN HCL 2000 MG/400ML IV SOLN
2000.0000 mg | INTRAVENOUS | Status: DC
  Administered 2023-05-13: 2000 mg via INTRAVENOUS
  Filled 2023-05-12 (×2): qty 400

## 2023-05-12 MED ORDER — IOHEXOL 350 MG/ML SOLN
75.0000 mL | Freq: Once | INTRAVENOUS | Status: AC | PRN
  Administered 2023-05-12: 75 mL via INTRAVENOUS

## 2023-05-12 NOTE — Assessment & Plan Note (Signed)
Cont statin

## 2023-05-12 NOTE — Progress Notes (Addendum)
SLP Cancellation Note  Patient Details Name: Paul Bass MRN: [id number] DOB: [birth year]   Cancelled treatment:       Reason Eval/Treat Not Completed: Patient at procedure or test/unavailable  Pt is still w/ ECHO. Also per chart review, pt has only been admitted for 9 hours only currently. ST services will f/u tomorrow post Time in setting of Chronic Advanced ischemic dis. Baseline, and pt also having Baseline Cognitive-communication deficits. Per MD note, pt's overall status w/ "acute decompensation in setting of scrotal cellulitis/balanitis" -- this may be affecting his baseline communication skills.  Noted MRI revealed: "punctate acute or subacute infarct in the Right superior cerebellum. No associated hemorrhage or mass effect. 2. Otherwise stable MRI appearance of advanced chronic ischemic disease since 05-27-2020, most pronounced in the left MCA and right cerebellar artery territories.".  Pt passed the swallow screen and is on a regular diet per MD.  OF NOTE: Pt has BASELINE Cognitive-communication deficits. He received Outpatient ST services for several months and was discharged from services 10/2022:  "treatment session targeted pt's word finding and communication breakdown goals. SLP facilitated session by providing the following interventions:  Pt's wife attended session to complete caregiver training in programming pt's TouchTalk device. Skilled verbal and written information provided with pt's wife able to return demonstrate adding icons, using device to take pictures for visual support of communicative content as well as voice recording of pt."    Jerilynn Som, MS, CCC-SLP Speech Language Pathologist Rehab Services; [facility] [place] [phone] (ascom) Sonja Manseau 05/12/2023, 1:42 PM

## 2023-05-12 NOTE — Evaluation (Signed)
Occupational Therapy Evaluation Patient Details Name: Paul Bass MRN: [id number] DOB: Aug 31, 1945 Today's Date: 05/12/2023   History of Present Illness 77 year old male admitted to [facility] after patient developed fever worsening right-sided weakness generalized malaise. Past medical history significant of CAD, COPD, type 2 diabetes, CVA, BPH presenting with CVA, scrotal cellulitis versus balanitis. 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   Clinical Impression   Pt seen for OT/PT co-eval to maximize outcomes and for safety. PTA, pt reports being IND with ADLs and mobility, including driving.    Pt presents with mild RUE decreased FMC/GMC coordination, R inattention, decreased balance and mobility. Denis visual changes. Educated pt on obtaining clearance from MD prior to returning to driving. Dysmetria present RUE, visual scanning/tracking WNL. MOD I for bed mobility, initially completes ambulation with 1 person HHA, balance improves with RW requiring supervision. Will require CGA-MIN A for ADL performance. Pt would benefit from skilled OT services to address noted impairments and functional limitations (see below for any additional details) in order to maximize safety and independence while minimizing falls risk and caregiver burden. Anticipate the need for follow up OT services upon acute hospital DC.       If plan is discharge home, recommend the following: A little help with walking and/or transfers;A little help with bathing/dressing/bathroom;Direct supervision/assist for financial management;Direct supervision/assist for medications management;Assist for transportation;Help with stairs or ramp for entrance    Functional Status Assessment  Patient has had a recent decline in their functional status and demonstrates the ability to make significant improvements in function in a reasonable and predictable amount of time.  Equipment Recommendations  None recommended by OT     Recommendations for Other Services Other (comment)     Precautions / Restrictions Precautions Precautions: Fall Restrictions Weight Bearing Restrictions: No      Mobility Bed Mobility Overal bed mobility: Modified Independent, Independent Bed Mobility: Supine to Sit, Sit to Supine     Supine to sit: Independent Sit to supine: Independent   General bed mobility comments: slightly impulsive    Transfers Overall transfer level: Needs assistance Equipment used: None Transfers: Sit to/from Stand Sit to Stand: Contact guard assist           General transfer comment: patient is generally unsteady with initial standing leaning legs on stretcher.      Balance Overall balance assessment: Needs assistance Sitting-balance support: Feet supported Sitting balance-Leahy Scale: Good     Standing balance support: Bilateral upper extremity supported, During functional activity, Reliant on assistive device for balance Standing balance-Leahy Scale: Fair                             ADL either performed or assessed with clinical judgement   ADL Overall ADL's : Needs assistance/impaired     Grooming: Wash/dry hands;Standing;Cueing for sequencing Grooming Details (indicate cue type and reason): R inattention noted                 Toilet Transfer: Contact guard Marine scientist Details (indicate cue type and reason): simulated         Functional mobility during ADLs: Rolling walker (2 wheels);Contact guard assist General ADL Comments: Pt MOD I for bed mobility, CGA for transfers and will require CGA-MIN for ADL performance.     Vision Baseline Vision/History: 1 Wears glasses Ability to See in Adequate Light: 0 Adequate Patient Visual Report: No change from baseline  Pertinent Vitals/Pain Pain Assessment Pain Assessment: No/denies pain     Extremity/Trunk Assessment Upper Extremity Assessment Upper Extremity  Assessment: RUE deficits/detail;Right hand dominant RUE Deficits / Details: mild dysmetria, R inattention RUE Sensation: decreased proprioception RUE Coordination: decreased fine motor;decreased gross motor   Lower Extremity Assessment Lower Extremity Assessment: Defer to PT evaluation RLE Deficits / Details: some dragging of R LE with ambulation RLE Coordination: decreased gross motor   Cervical / Trunk Assessment Cervical / Trunk Assessment: Normal   Communication Communication Communication: Difficulty communicating thoughts/reduced clarity of speech;Other (comment) (slurred speech, reports baseline) Cueing Techniques: Verbal cues   Cognition Arousal: Alert Behavior During Therapy: WFL for tasks assessed/performed Overall Cognitive Status: Within Functional Limits for tasks assessed                                       General Comments  Edu on role and purpose of OT, recommendation for follow-up outpatient OT after d/c, edu on clearance from MD prior to returning to driving            Home Living Family/patient expects to be discharged to:: Private residence Living Arrangements: Spouse/significant other Available Help at Discharge: Family Type of Home: House Home Access: Level entry     Home Layout: One level     Bathroom Shower/Tub: Tub/shower unit         Home Equipment: None          Prior Functioning/Environment Prior Level of Function : Independent/Modified Independent;Driving             Mobility Comments: Was not using AD prior ADLs Comments: Independent at baseline        OT Problem List: Decreased range of motion;Impaired balance (sitting and/or standing);Decreased coordination;Decreased safety awareness;Impaired UE functional use      OT Treatment/Interventions: Self-care/ADL training;Therapeutic exercise;Neuromuscular education;DME and/or AE instruction;Therapeutic activities;Patient/family education;Balance training     OT Goals(Current goals can be found in the care plan section) Acute Rehab OT Goals OT Goal Formulation: With patient Time For Goal Achievement: 05/26/23 Potential to Achieve Goals: Good  OT Frequency: Min 1X/week    Co-evaluation   Reason for Co-Treatment: For patient/therapist safety;To address functional/ADL transfers PT goals addressed during session: Mobility/safety with mobility;Balance;Proper use of DME        AM-PAC OT "6 Clicks" Daily Activity     Outcome Measure Help from another person eating meals?: None Help from another person taking care of personal grooming?: None Help from another person toileting, which includes using toliet, bedpan, or urinal?: A Little Help from another person bathing (including washing, rinsing, drying)?: A Little Help from another person to put on and taking off regular upper body clothing?: None Help from another person to put on and taking off regular lower body clothing?: A Little 6 Click Score: 21   End of Session Equipment Utilized During Treatment: Rolling walker (2 wheels) Nurse Communication: Other (comment)  Activity Tolerance: Patient tolerated treatment well Patient left: in bed;with call bell/phone within reach;with bed alarm set  OT Visit Diagnosis: Unsteadiness on feet (R26.81);Other abnormalities of gait and mobility (R26.89);Hemiplegia and hemiparesis Hemiplegia - Right/Left: Right Hemiplegia - dominant/non-dominant: Dominant                Time: 1359-1415 OT Time Calculation (min): 16 min Charges:  OT General Charges $OT Visit: 1 Visit OT Evaluation $OT Eval Moderate Complexity: 1 Mod  Sudeep Scheibel L.  Wrangler Penning, OTR/L  05/12/23, 4:55 PM

## 2023-05-12 NOTE — Assessment & Plan Note (Signed)
SSI  A1C  

## 2023-05-12 NOTE — ED Notes (Signed)
First Nurse note: pt BIB ems with c/o weakness that has been ongoing for last few hours.  Pt has had multiple falls in last few hours.  Pt did have flu shot 10/10.  Pt reportedly having some increased trouble moving his right leg.  Pt otherwise alert and in NAD w/ems.    18RAC 130/78 100HR  98.7 139 CBG

## 2023-05-12 NOTE — Progress Notes (Signed)
Echocardiogram 2D Echocardiogram has been performed. Definity IV ultrasound imaging agent used on this study.  Paul Bass 05/12/2023, 1:38 PM

## 2023-05-12 NOTE — Progress Notes (Signed)
PHARMACY -  BRIEF ANTIBIOTIC NOTE   Pharmacy has received consult(s) for Vancomycin, Cefepime  from an ED provider.  The patient's profile has been reviewed for ht/wt/allergies/indication/available labs.    One time order(s) placed for Vancomycin 2 gm IV X 1 and Cefepime 2 gm IV X 1   Further antibiotics/pharmacy consults should be ordered by admitting physician if indicated.                       Thank you, Yuriel Lopezmartinez D 05/12/2023  4:14 AM

## 2023-05-12 NOTE — Assessment & Plan Note (Signed)
PPI ?

## 2023-05-12 NOTE — ED Notes (Signed)
Provided snack and water to pt.

## 2023-05-12 NOTE — H&P (Addendum)
History and Physical    Patient: Paul Bass:[id number] DOB: 17-Dec-1945 DOA: 05/12/2023 DOS: the patient was seen and examined on 05/12/2023 PCP: Myrene Buddy, NP  Patient coming from: Home  Chief Complaint:  Chief Complaint  Patient presents with   Weakness   HPI: Paul Bass is a 77 y.o. male with medical history significant of CAD, COPD, type 2 diabetes, CVA, BPH presenting with CVA, scrotal cellulitis versus balanitis.  History primarily from wife in the setting of CVA.  Per report, patient had flu shot earlier this week.  Per the wife, within 24 hours patient developed fever worsening right-sided weakness generalized malaise.  Symptoms have persisted since this point.  No nausea or vomiting.  No chest pain or shortness of breath.  Noted baseline history of CVA with right-sided deficits.  This is acutely worsened.  Non-smoker.  No alcohol use.  No reported dysuria.  Husband does report worsening scrotal and groin redness pain and swelling.  Baseline type 2 diabetes.  Blood sugars have been in the 130s at home. Presented to the ER a Tmax of 100, hemodynamically stable.  Satting well room air.  White count 15, hemoglobin 13.9, platelets 160, urinalysis mildly indicative of infection.  Troponin 19-28.  COVID flu and RSV negative.  Lactate within normal limits.  CT head, chest x-ray as well as scrotal ultrasound grossly stable. Review of Systems: As mentioned in the history of present illness. All other systems reviewed and are negative. Past Medical History:  Diagnosis Date   Accelerated junctional rhythm    Anemia    Anxiety    a.) Tx'd with BZO PRN   Arthritis    Bilateral carotid artery disease ([name])    BPH (benign prostatic hypertrophy)    CAD S/P percutaneous coronary angioplasty    a.) PCI in 2002 placing a RCA stent (unknown type). b.) LHC 12/07/2015: 5% ISR m-dRCA, 90% dRCA, 25% pRCA, 25% p-mLCx, 100% OM3, 95% oOM2-OM2, 75% m-dLAD; refer to CVTS. c.) 3v CABG  01/03/2016   CHF (congestive heart failure) ([name])    a.) TTE 05/12/2012: EF 50-55%; mild LA enlargement; G1DD. b.)  TTE 11/26/2015: EF 45%; mild BAE; triv PR, mild MR/TR; inferolateral HK; G1DD. c.)  TTE 11/07/2017: EF 35%; RV enlargement; BAE; inferior and inferolateral HK; triv PR; mild MR/TR. d.)  TTE 09/17/2019: EF 35%; mild LVH; triv MR/TR. e.)  TTE 08/11/2021: EF 50%; triv MR/TR; G1DD.   Cognitive deficit following cerebrovascular accident (CVA)    COPD (chronic obstructive pulmonary disease) ([name])    Cortical cataract    Depression    Dyspnea    GERD (gastroesophageal reflux disease)    History of 2019 novel coronavirus disease (COVID-19) 01/19/2021   History of acute inferior wall MI 2002   a.) PCI was performed placing a RCA stent (unknown type)   History of kidney stones    HTN (hypertension)    Hyperlipidemia    Hypothyroidism    Long term current use of antithrombotics/antiplatelets    a.) DAPT therapy (ASA + clopidogrel)   OSA (obstructive sleep apnea)    a.) does not require nocturnal PAP therapy   PLMD (periodic limb movement disorder)    Postoperative atrial fibrillation ([name]) 01/03/2016   a.) following CABG procedure   Prostatitis    PVD (peripheral vascular disease) ([name])    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 b/) residual RIGHT sided weakness   T2DM (type 2 diabetes mellitus) ([name])    Tobacco abuse    Past Surgical History:  Procedure Laterality Date   CARDIAC CATHETERIZATION N/A 12/07/2015   Procedure: Left Heart Cath and Coronary Angiography;  Surgeon: Lamar Blinks, MD;  Location: [facility];  Service: Cardiovascular;  Laterality: N/A;   CAROTID ANGIOGRAPHY N/A 06/05/2022   Procedure: CAROTID ANGIOGRAPHY;   Surgeon: Annice Needy, MD;  Location: [facility];  Service: Cardiovascular;  Laterality: N/A;   CAROTID PTA/STENT INTERVENTION Left    HX: 2 stents   CATARACT EXTRACTION W/PHACO Left 03/24/2021   Procedure: CATARACT EXTRACTION PHACO AND INTRAOCULAR LENS PLACEMENT (IOC) LEFT DIABETIC 6.67 00:42.5;  Surgeon: Galen Manila, MD;  Location: [facility] OR[facility];  Service: Ophthalmology;  Laterality: Left;   COLONOSCOPY WITH PROPOFOL N/A 02/18/2019   Procedure: COLONOSCOPY WITH PROPOFOL;  Surgeon: Midge Minium, MD;  Location: [facility] ENDOSCOPY;  Service: Endoscopy;  Laterality: N/A;   COLONOSCOPY WITH PROPOFOL N/A 12/22/2021   Procedure: COLONOSCOPY WITH PROPOFOL;  Surgeon: Midge Minium, MD;  Location: [facility];  Service: Endoscopy;  Laterality: N/A;   CORONARY ANGIOPLASTY WITH STENT PLACEMENT Left 2002   CORONARY ARTERY BYPASS GRAFT N/A 01/03/2016   Procedure: 3v CORONARY ARTERY BYPASS GRAFT (LIMA-LAD, SVG-PDA, SVG-OM2); Location: [facility]; Surgeon: Cain Sieve, MD   CYSTOSCOPY W/ URETERAL STENT REMOVAL N/A 10/04/2021   Procedure: CYSTOSCOPY WITH PROSTATE FOREIGN BODY REMOVAL;  Surgeon: Riki Altes, MD;  Location: [facility] OR[facility];  Service: Urology;  Laterality: N/A;   CYSTOSCOPY WITH INSERTION OF UROLIFT N/A 04/29/2019   Procedure: CYSTOSCOPY WITH INSERTION OF UROLIFT;  Surgeon: Riki Altes, MD;  Location: [facility];  Service: Urology;  Laterality: N/A;   ESOPHAGOGASTRODUODENOSCOPY (EGD) WITH PROPOFOL N/A 09/11/2016   Procedure: ESOPHAGOGASTRODUODENOSCOPY (EGD) WITH PROPOFOL;  Surgeon: Midge Minium, MD;  Location: [facility] ENDOSCOPY;  Service: Endoscopy;  Laterality: N/A;   ESOPHAGOGASTRODUODENOSCOPY (EGD) WITH PROPOFOL N/A 10/17/2016   Procedure: ESOPHAGOGASTRODUODENOSCOPY (EGD) WITH PROPOFOL;  Surgeon: Midge Minium, MD;  Location: [facility] ENDOSCOPY;  Service: Endoscopy;  Laterality: N/A;   ESOPHAGOGASTRODUODENOSCOPY (EGD) WITH PROPOFOL N/A 11/13/2017   Procedure: ESOPHAGOGASTRODUODENOSCOPY (EGD)  WITH PROPOFOL;  Surgeon: Midge Minium, MD;  Location: [facility] ENDOSCOPY;  Service: Endoscopy;  Laterality: N/A;   pins R lower leg Left    rotator cuff replaced Right    Paul Bass DILATION N/A 10/17/2016   Procedure: Paul Bass DILATION;  Surgeon: Midge Minium, MD;  Location: [facility] ENDOSCOPY;  Service: Endoscopy;  Laterality: N/A;   TRANSURETHRAL INCISION OF PROSTATE N/A 10/04/2021   Procedure: TRANSURETHRAL INCISION OF THE PROSTATE (TUIP);  Surgeon: Riki Altes, MD;  Location: [facility] OR[facility];  Service: Urology;  Laterality: N/A;   Social History:  reports that he quit smoking about 10 years ago. His smoking use included cigarettes. He has never used smokeless tobacco. He reports that he does not currently use alcohol. He reports that he does not use drugs.  No Known Allergies  Family History  Problem Relation Age of Onset   Heart failure Father    Throat cancer Mother    Diabetes Brother     Prior to Admission medications   Medication Sig Start Date End Date Taking? Authorizing Provider  albuterol (VENTOLIN HFA) 108 (90 Base) MCG/ACT inhaler SMARTSIG:2 inhalation Via Inhaler Every 4 Hours PRN 11/08/21   [provider]  aspirin EC  81 MG tablet Take 1 tablet (81 mg total) by mouth daily at 6 (six) AM. Swallow whole. 06/07/22   Georgiana Spinner, NP  atorvastatin (LIPITOR) 80 MG tablet Take 80 mg by mouth daily.  01/09/16   [provider]  buPROPion (WELLBUTRIN XL) 150 MG 24 hr tablet Take 150 mg by mouth at bedtime. 04/07/20   [provider]  cholecalciferol (VITAMIN D3) 25 MCG (1000 UT) tablet Take 1,000 Units by mouth every evening.    [provider]  clonazePAM (KLONOPIN) 0.5 MG tablet Take 0.5 mg by mouth 2 (two) times daily.    [provider]  clopidogrel (PLAVIX) 75 MG tablet Take 75 mg by mouth daily.  12/21/15   [provider]  Coenzyme Q10 (COQ10) 100 MG CAPS Take 100 mg by mouth at bedtime.    [provider]  Ferrous Sulfate  (IRON) 325 (65 Fe) MG TABS Take 325 mg by mouth daily.    [provider]  finasteride (PROSCAR) 5 MG tablet Take 5 mg by mouth daily.    [provider]  fluticasone-salmeterol (ADVAIR) 500-50 MCG/ACT AEPB Inhale into the lungs.    [provider]  glucose blood test strip     [provider]  levofloxacin (LEVAQUIN) 500 MG tablet Take 500 mg by mouth daily. 09/04/22   [provider]  levothyroxine (SYNTHROID, LEVOTHROID) 100 MCG tablet Take 100 mcg by mouth daily before breakfast.    [provider]  loratadine (CLARITIN) 10 MG tablet Take 10 mg by mouth daily.    [provider]  metFORMIN (GLUCOPHAGE) 500 MG tablet Take 500 mg by mouth 2 (two) times daily.  09/07/14   [provider]  metoprolol tartrate (LOPRESSOR) 25 MG tablet Take 25 mg by mouth 2 (two) times daily.    [provider]  Multiple Vitamin (MULTIVITAMIN WITH MINERALS) TABS Take 1 tablet by mouth daily.    [provider]  naproxen sodium (ALEVE) 220 MG tablet Take 220-440 mg by mouth 2 (two) times daily as needed (pain.).    [provider]  oxyCODONE-acetaminophen (PERCOCET/ROXICET) 5-325 MG tablet Take 1 tablet by mouth every 6 (six) hours as needed for moderate pain. 06/06/22   Georgiana Spinner, NP  pantoprazole (PROTONIX) 40 MG tablet TAKE 1 TABLET BY MOUTH EVERYDAY AT BEDTIME 02/05/23   Midge Minium, MD  psyllium (METAMUCIL SMOOTH TEXTURE) 28 % packet Take 1 packet by mouth daily.    [provider]  RA KRILL OIL 500 MG CAPS Take 500 mg by mouth at bedtime.     [provider]  tamsulosin (FLOMAX) 0.4 MG CAPS capsule TAKE 1 CAPSULE BY MOUTH EVERY DAY 05/11/22   Stoioff, Verna Czech, MD  venlafaxine XR (EFFEXOR-XR) 150 MG 24 hr capsule Take 150 mg by mouth daily. 06/29/21   [provider]    Physical Exam: Vitals:   05/12/23 0046 05/12/23 0048 05/12/23 0408 05/12/23 0538  BP: (!) 130/93   111/67  Pulse: 81    67  Resp: 20   18  Temp: 99.8 F (37.7 C)  100 F (37.8 C) 97.9 F (36.6 C)  TempSrc: Oral  Rectal Oral  SpO2: 92%   94%  Weight:  96.2 kg    Height:  5\' 11"  (1.803 m)     Physical Exam Constitutional:      Appearance: He is obese.  HENT:     Head: Normocephalic and atraumatic.     Nose: Nose  normal.     Mouth/Throat:     Mouth: Mucous membranes are moist.  Eyes:     Pupils: Pupils are equal, round, and reactive to light.  Cardiovascular:     Rate and Rhythm: Normal rate and regular rhythm.  Pulmonary:     Effort: Pulmonary effort is normal.  Abdominal:     General: Bowel sounds are normal.  Musculoskeletal:     Comments: + R sided weakness    Skin:    Comments: + scrotal and groin redness, swelling   Neurological:     Comments: + R sided weakness  + dysarthria   Psychiatric:        Mood and Affect: Mood normal.     Data Reviewed:  There are no new results to review at this time.  US SCROTUM W/DOPPLER CLINICAL DATA:  77 year old male presenting with testicular pain for 1 week.  EXAM: SCROTAL ULTRASOUND  DOPPLER ULTRASOUND OF THE TESTICLES  TECHNIQUE: Complete ultrasound examination of the testicles, epididymis, and other scrotal structures was performed. Color and spectral Doppler ultrasound were also utilized to evaluate blood flow to the testicles.  COMPARISON:  No priors.  FINDINGS: Right testicle  Measurements: 3.4 x 2.7 x 2.7 cm. No mass or microlithiasis visualized.  Left testicle  Measurements: 3.6 x 3.0 x 3.1 cm. No mass or microlithiasis visualized.  Right epididymis: Enlarged measuring 2.4 x 2.9 x 3.0 cm with multiple small anechoic lesions with increased through transmission, compatible with cysts or spermatoceles, largest of which measures 7 x 6 x 6 mm. Echogenic foci with posterior acoustic shadowing indicative of calcifications incidentally noted.  Left epididymis: Enlarged measuring 1.4 x 2.2 x 2.9 cm with  multiple anechoic lesions with increased through transmission compatible with cysts or spermatoceles, largest of which measures 9 x 12 x 8 mm. Echogenic foci with posterior acoustic shadowing indicative of calcifications incidentally noted.  Hydrocele: Bilateral hydroceles, simple on the right and septated on the left.  Varicocele:  Bilateral varicoceles.  Pulsed Doppler interrogation of both testes demonstrates normal low resistance arterial and venous waveforms bilaterally.  IMPRESSION: 1. Bilateral hydroceles, simple on the right and complex (multiseptated) left. 2. Bilateral varicoceles. 3. Epididymides are enlarged and heterogeneous in appearance bilaterally, with multiple epididymal cysts and/or spermatoceles, and multiple calcifications. 4. No evidence of testicular torsion, or findings suggestive of epididymo-orchitis.  Electronically Signed   By: Trudie Reed M.D.   On: 05/12/2023 07:26 DG Chest Port 1 View CLINICAL DATA:  77 year old male with progressive weakness. Possible sepsis.  EXAM: PORTABLE CHEST 1 VIEW  COMPARISON:  Chest x-ray 09/11/2016.  FINDINGS: Lung volumes are normal. No consolidative airspace disease. No pleural effusions. No pneumothorax. No pulmonary nodule or mass noted. Moderate-sized hiatal hernia. Pulmonary vasculature and the cardiomediastinal silhouette are otherwise within normal limits. Atherosclerosis in the thoracic aorta. Status post median sternotomy for CABG.  IMPRESSION: 1. No radiographic evidence of acute cardiopulmonary disease. 2. Aortic atherosclerosis. 3. Hiatal hernia again noted.  Electronically Signed   By: Trudie Reed M.D.   On: 05/12/2023 06:27 CT HEAD WO CONTRAST ( ) CLINICAL DATA:  Multiple falls. Neuro deficit, acute, stroke suspected.  EXAM: CT HEAD WITHOUT CONTRAST  TECHNIQUE: Contiguous axial images were obtained from the base of the skull through the vertex without intravenous  contrast.  RADIATION DOSE REDUCTION: This exam was performed according to the departmental dose-optimization program which includes automated exposure control, adjustment of the mA and/or kV according to patient size and/or use of iterative reconstruction technique.  COMPARISON:  05/12/2012  FINDINGS: Brain: Old left MCA infarct in the left frontal lobe with encephalomalacia. There is atrophy and chronic small vessel disease changes. No acute intracranial abnormality. Specifically, no hemorrhage, hydrocephalus, mass lesion, acute infarction, or significant intracranial injury.  Vascular: No hyperdense vessel or unexpected calcification.  Skull: No acute calvarial abnormality.  Sinuses/Orbits: No acute findings  Other: None  IMPRESSION: Old left MCA infarct with left frontal encephalomalacia.  Atrophy, chronic microvascular disease.  No acute intracranial abnormality.  Electronically Signed   By: Charlett Nose M.D.   On: 05/12/2023 03:22  Lab Results  Component Value Date   WBC 14.9 (H) 05/12/2023   HGB 13.9 05/12/2023   HCT 39.9 05/12/2023   MCV 93.2 05/12/2023   PLT 160 05/12/2023   Last metabolic panel Lab Results  Component Value Date   GLUCOSE 147 (H) 05/12/2023   NA 135 05/12/2023   K 3.8 05/12/2023   CL 102 05/12/2023   CO2 24 05/12/2023   BUN 18 05/12/2023   CREATININE 1.04 05/12/2023   GFRNONAA >60 05/12/2023   CALCIUM 8.6 (L) 05/12/2023   PROT 7.2 05/12/2023   ALBUMIN 3.7 05/12/2023   BILITOT 0.9 05/12/2023   ALKPHOS 83 05/12/2023   AST 16 05/12/2023   ALT 16 05/12/2023   ANIONGAP 9 05/12/2023    Assessment and Plan: Cerebrovascular accident (CVA) due to embolism ([name]) Prior hx/o CVA 2013 w/ chronic R sided deficits  Now w acute decompensation in setting of scrotal cellulitis/balanitis  + mild dysarthria  CT head WNL  Will plan for formal CVA evaluation including MRI brain, CTA head and neck, 2D ECHO, risk stratification labs  Cont asa,  plavix statin  Formal neuro consult if MRI indicative    Cellulitis of groin Left-sided scrotal redness pain and swelling concerning for cellulitis No noted abscess on scrotal ultrasound or Fournier's gangrene Will place on IV rocephin, flagyl, vancomycin for infectious coverage  Differential diagnosis includes balanitis given baseline type 2 diabetes  Will also add on topical miconazole  Urology consult as clinically indicated     Atherosclerotic heart disease of native coronary artery without angina pectoris Coronary artery disease s/p CABG 2017  No active CP  Trop 19  EKG SR w/ nonspecific conduction delay  Cont home regimen     Chronic systolic CHF (congestive heart failure), NYHA class 2 ([name]) 2D ECHO 07/2021 w/ EF 50% and grade 1 diastolic dysfunction  Appears euvolemic  Monitor volume status    Type 2 diabetes mellitus without complications ([name]) SSI  A1C    HLD (hyperlipidemia) Cont statin    GERD (gastroesophageal reflux disease) PPI    Benign prostatic hyperplasia with incomplete bladder emptying Finasteride    COPD (chronic obstructive pulmonary disease) ([name]) Stable from a resp standpoint  Cont home inhaler     Greater than 50% was spent in counseling and coordination of care with patient Total encounter time 80 minutes or more    Advance Care Planning:   Code Status: Prior   Consults: None   Family Communication: Wife at the bedside  Severity of Illness: The appropriate patient status for this patient is INPATIENT. Inpatient status is judged to be reasonable and necessary in order to provide the required intensity of service to ensure the patient's safety. The patient's presenting symptoms, physical exam findings, and initial radiographic and laboratory data in the context of their chronic comorbidities is felt to place them at high risk for further clinical deterioration. Furthermore, it is not  anticipated that the patient will be medically  stable for discharge from the hospital within 2 midnights of admission.   * I certify that at the point of admission it is my clinical judgment that the patient will require inpatient hospital care spanning beyond 2 midnights from the point of admission due to high intensity of service, high risk for further deterioration and high frequency of surveillance required.*  Author: Floydene Flock, MD 05/12/2023 9:47 AM  For on call review [email or web address].

## 2023-05-12 NOTE — ED Notes (Signed)
PT daughter/family member left to go home for a while. Requested RN to call daughter if pt gets moved.

## 2023-05-12 NOTE — Assessment & Plan Note (Signed)
2D ECHO 07/2021 w/ EF 50% and grade 1 diastolic dysfunction  Appears euvolemic  Monitor volume status

## 2023-05-12 NOTE — Assessment & Plan Note (Signed)
Coronary artery disease s/p CABG 2017  No active CP  Trop 19  EKG SR w/ nonspecific conduction delay  Cont home regimen

## 2023-05-12 NOTE — Assessment & Plan Note (Signed)
Stable from a resp standpoint  Cont home inhaler

## 2023-05-12 NOTE — Assessment & Plan Note (Signed)
Prior hx/o CVA 2013 w/ chronic R sided deficits  Now w acute decompensation in setting of scrotal cellulitis/balanitis  + mild dysarthria  CT head WNL  Will plan for formal CVA evaluation including MRI brain, CTA head and neck, 2D ECHO, risk stratification labs  Cont asa, plavix statin  Formal neuro consult if MRI indicative

## 2023-05-12 NOTE — Evaluation (Signed)
Physical Therapy Evaluation Patient Details Name: Paul Bass MRN: [id number] DOB: 1946/07/08 Today's Date: 05/12/2023  History of Present Illness  77 year old male admitted to [facility] after patient developed fever worsening right-sided weakness generalized malaise. Past medical history significant of CAD, COPD, type 2 diabetes, CVA, BPH presenting with CVA, scrotal cellulitis versus balanitis. 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   Clinical Impression  Patient received on stretcher. He is pleasant and agreeable to PT/OT assessment. Patient is independent with bed mobility. He demonstrates good sitting balance. Standing balance is off, requiring min A (hand held) initially. Patient was given a RW and balance significantly improved with walker to only requiring supervision. He does tend to shuffle the R LE during ambulation. Patient will continue to benefit from skilled PT to improve independence, balance and safety.           If plan is discharge home, recommend the following: A little help with walking and/or transfers;Help with stairs or ramp for entrance   Can travel by private vehicle    yes    Equipment Recommendations Rolling walker (2 wheels)  Recommendations for Other Services       Functional Status Assessment Patient has had a recent decline in their functional status and demonstrates the ability to make significant improvements in function in a reasonable and predictable amount of time.     Precautions / Restrictions Precautions Precautions: Fall Restrictions Weight Bearing Restrictions: No      Mobility  Bed Mobility Overal bed mobility: Modified Independent, Independent Bed Mobility: Supine to Sit, Sit to Supine     Supine to sit: Independent Sit to supine: Independent   General bed mobility comments: slightly impulsive    Transfers Overall transfer level: Needs assistance Equipment used: None Transfers: Sit to/from Stand Sit to Stand:  Contact guard assist           General transfer comment: patient is generally unsteady with initial standing leaning legs on stretcher.    Ambulation/Gait Ambulation/Gait assistance: Min assist, Supervision Gait Distance (Feet): 50 Feet Assistive device: Rolling walker (2 wheels), 1 person hand held assist Gait Pattern/deviations: Step-through pattern, Decreased step length - right, Decreased stride length Gait velocity: decreased     General Gait Details: Patient initially ambulated with single UE support ( hand held) and was unsteady. Therefore gave him walked and balance significantly improved to only needing supervision.  Stairs            Wheelchair Mobility     Tilt Bed    Modified Rankin (Stroke Patients Only)       Balance Overall balance assessment: Needs assistance Sitting-balance support: Feet supported Sitting balance-Leahy Scale: Good     Standing balance support: Bilateral upper extremity supported, During functional activity, Reliant on assistive device for balance Standing balance-Leahy Scale: Fair                               Pertinent Vitals/Pain Pain Assessment Pain Assessment: No/denies pain    Home Living Family/patient expects to be discharged to:: Private residence Living Arrangements: Spouse/significant other   Type of Home: House Home Access: Level entry       Home Layout: One level Home Equipment: None      Prior Function Prior Level of Function : Independent/Modified Independent;Driving             Mobility Comments: Was not using AD prior ADLs Comments: Independent at  baseline     Extremity/Trunk Assessment   Upper Extremity Assessment Upper Extremity Assessment: Defer to OT evaluation    Lower Extremity Assessment Lower Extremity Assessment: RLE deficits/detail RLE Deficits / Details: some dragging of R LE with ambulation RLE Coordination: decreased gross motor    Cervical / Trunk  Assessment Cervical / Trunk Assessment: Normal  Communication   Communication Communication: Difficulty communicating thoughts/reduced clarity of speech;Other (comment) (stuttering speech, he reports is baseline) Cueing Techniques: Verbal cues  Cognition Arousal: Alert Behavior During Therapy: WFL for tasks assessed/performed Overall Cognitive Status: Within Functional Limits for tasks assessed                                          General Comments      Exercises     Assessment/Plan    PT Assessment Patient needs continued PT services  PT Problem List Decreased balance;Decreased coordination;Decreased knowledge of use of DME;Impaired sensation       PT Treatment Interventions DME instruction;Gait training;Stair training;Functional mobility training;Therapeutic activities;Therapeutic exercise;Balance training;Neuromuscular re-education;Patient/family education    PT Goals (Current goals can be found in the Care Plan section)  Acute Rehab PT Goals Patient Stated Goal: to return home, improve PT Goal Formulation: With patient Time For Goal Achievement: 05/26/23 Potential to Achieve Goals: Good    Frequency Min 1X/week     Co-evaluation PT/OT/SLP Co-Evaluation/Treatment: Yes Reason for Co-Treatment: For patient/therapist safety;To address functional/ADL transfers PT goals addressed during session: Mobility/safety with mobility;Balance;Proper use of DME         AM-PAC PT "6 Clicks" Mobility  Outcome Measure Help needed turning from your back to your side while in a flat bed without using bedrails?: None Help needed moving from lying on your back to sitting on the side of a flat bed without using bedrails?: None Help needed moving to and from a bed to a chair (including a wheelchair)?: A Little Help needed standing up from a chair using your arms (e.g., wheelchair or bedside chair)?: A Little Help needed to walk in hospital room?: A Little Help  needed climbing 3-5 steps with a railing? : A Little 6 Click Score: 20    End of Session   Activity Tolerance: Patient tolerated treatment well Patient left: in bed;with call bell/phone within reach Nurse Communication: Mobility status PT Visit Diagnosis: Unsteadiness on feet (R26.81);Difficulty in walking, not elsewhere classified (R26.2);Hemiplegia and hemiparesis Hemiplegia - Right/Left: Right Hemiplegia - dominant/non-dominant: Dominant Hemiplegia - caused by: Cerebral infarction    Time: 1359-1415 PT Time Calculation (min) (ACUTE ONLY): 16 min   Charges:   PT Evaluation $PT Eval Moderate Complexity: 1 Mod   PT General Charges $$ ACUTE PT VISIT: 1 Visit         Kaylyne Axton, PT, GCS 05/12/23,2:53 PM

## 2023-05-12 NOTE — Assessment & Plan Note (Signed)
Left-sided scrotal redness pain and swelling concerning for cellulitis No noted abscess on scrotal ultrasound or Fournier's gangrene Will place on IV rocephin, flagyl, vancomycin for infectious coverage  Differential diagnosis includes balanitis given baseline type 2 diabetes  Will also add on topical miconazole  Urology consult as clinically indicated

## 2023-05-12 NOTE — ED Triage Notes (Signed)
Pt presents to ER with wife via ems from home with c/o progressive weakness that has been ongoing since pt received flu shot on 10/10.  Pt has had some issue with moving his right leg.  Pt also has some difficulty speaking at baseline, but wife states this has become worse.  Last time pt was seen in his normal state was around lunch time yesterday 10/11.  Pt has also had 2 falls in last 2 days.  Pt is otherwise A&O x4 and in NAD at this time.    Pt has hx of stroke in 2013.  Pt takes plavix at home.

## 2023-05-12 NOTE — Progress Notes (Signed)
Pharmacy Antibiotic Note  Paul Bass is a 77 y.o. male admitted on 05/12/2023 with cellulitis.  Pharmacy has been consulted for Vancomycin dosing. Patient is also receiving ceftriaxone 2g IV every 24 hours and Flagyl 500mg  BID. Patient received vancomycin 2000mg  loading dose in ED.   Plan: Start Vancomycin 2000 mg IV Q 24 hrs. Goal AUC 400-550. Expected AUC: 506 SCr used: 1.04 Css:11.1   Pharmacy will continue to monitor and adjust dose as needed.    Height: 5\' 11"  (180.3 cm) Weight: 96.2 kg (212 lb) IBW/kg (Calculated) : 75.3  Temp (24hrs), Avg:99.2 F (37.3 C), Min:97.9 F (36.6 C), Max:100 F (37.8 C)  Recent Labs  Lab 05/12/23 0052 05/12/23 0430  WBC 14.9*  --   CREATININE 1.04  --   LATICACIDVEN  --  1.5    Estimated Creatinine Clearance: 70.4 mL/min (by C-G formula based on SCr of 1.04 mg/dL).    No Known Allergies  Antimicrobials this admission: 10/12 Cefepime x1 10/12 Vancomycin >> 10/12 Flagyl >> 10/12 Ceftriaxone>>  Dose adjustments this admission: N/A  Microbiology results: 10/12 BCx: pending 10/12 UCx: pending   Thank you for allowing pharmacy to be a part of this patient's care.  Gardner Candle, PharmD, BCPS Clinical Pharmacist 05/12/2023 10:27 AM

## 2023-05-12 NOTE — ED Provider Notes (Signed)
[facility] Provider Note    Event Date/Time   First MD Initiated Contact with Patient 05/12/23 [phone]     (approximate)   History   Weakness   HPI  Paul Bass is a 77 y.o. male with history of CAD that is post CABG, CHF, COPD, hypertension, hyperlipidemia, diabetes, CVA, paroxysmal A-fib who presents to the emergency department with chills, weakness, left-sided testicular pain.  States symptoms started after he received a flu shot in his left arm Thursday morning.  States he started feeling poorly Thursday evening.  No cough, congestion, chest pain, shortness of breath, abdominal pain, vomiting, diarrhea, dysuria.   History provided by patient, wife..    Past Medical History:  Diagnosis Date   Accelerated junctional rhythm    Anemia    Anxiety    a.) Tx'd with BZO PRN   Arthritis    Bilateral carotid artery disease ([name])    BPH (benign prostatic hypertrophy)    CAD S/P percutaneous coronary angioplasty    a.) PCI in 2002 placing a RCA stent (unknown type). b.) LHC 12/07/2015: 5% ISR m-dRCA, 90% dRCA, 25% pRCA, 25% p-mLCx, 100% OM3, 95% oOM2-OM2, 75% m-dLAD; refer to CVTS. c.) 3v CABG 01/03/2016   CHF (congestive heart failure) ([name])    a.) TTE 05/12/2012: EF 50-55%; mild LA enlargement; G1DD. b.)  TTE 11/26/2015: EF 45%; mild BAE; triv PR, mild MR/TR; inferolateral HK; G1DD. c.)  TTE 11/07/2017: EF 35%; RV enlargement; BAE; inferior and inferolateral HK; triv PR; mild MR/TR. d.)  TTE 09/17/2019: EF 35%; mild LVH; triv MR/TR. e.)  TTE 08/11/2021: EF 50%; triv MR/TR; G1DD.   Cognitive deficit following cerebrovascular accident (CVA)    COPD (chronic obstructive pulmonary disease) ([name])    Cortical cataract    Depression    Dyspnea    GERD (gastroesophageal reflux disease)    History of 2019 novel coronavirus disease (COVID-19) 01/19/2021   History of acute inferior wall MI 2002   a.) PCI was performed placing a RCA stent (unknown type)   History  of kidney stones    HTN (hypertension)    Hyperlipidemia    Hypothyroidism    Long term current use of antithrombotics/antiplatelets    a.) DAPT therapy (ASA + clopidogrel)   OSA (obstructive sleep apnea)    a.) does not require nocturnal PAP therapy   PLMD (periodic limb movement disorder)    Postoperative atrial fibrillation ([name]) 01/03/2016   a.) following CABG procedure   Prostatitis    PVD (peripheral vascular disease) ([name])    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 b/) residual RIGHT sided weakness   T2DM (type 2 diabetes mellitus) ([name])    Tobacco abuse     Past Surgical History:  Procedure Laterality Date   CARDIAC CATHETERIZATION N/A 12/07/2015   Procedure: Left Heart Cath and Coronary Angiography;  Surgeon: Lamar Blinks, MD;  Location: [facility] INVASIVE CV LAB;  Service: Cardiovascular;  Laterality: N/A;   CAROTID ANGIOGRAPHY N/A 06/05/2022   Procedure: CAROTID ANGIOGRAPHY;  Surgeon: Annice Needy, MD;  Location: [facility] INVASIVE CV LAB;  Service: Cardiovascular;  Laterality: N/A;   CAROTID PTA/STENT INTERVENTION Left    HX: 2 stents   CATARACT EXTRACTION [facility] Left 03/24/2021  Procedure: CATARACT EXTRACTION PHACO AND INTRAOCULAR LENS PLACEMENT (IOC) LEFT DIABETIC 6.67 00:42.5;  Surgeon: Galen Manila, MD;  Location: [facility] ORS;  Service: Ophthalmology;  Laterality: Left;   COLONOSCOPY WITH PROPOFOL N/A 02/18/2019   Procedure: COLONOSCOPY WITH PROPOFOL;  Surgeon: Midge Minium, MD;  Location: [facility] ENDOSCOPY;  Service: Endoscopy;  Laterality: N/A;   COLONOSCOPY WITH PROPOFOL N/A 12/22/2021   Procedure: COLONOSCOPY WITH PROPOFOL;  Surgeon: Midge Minium, MD;  Location: [facility] ENDOSCOPY;  Service: Endoscopy;  Laterality: N/A;   CORONARY  ANGIOPLASTY WITH STENT PLACEMENT Left 2002   CORONARY ARTERY BYPASS GRAFT N/A 01/03/2016   Procedure: 3v CORONARY ARTERY BYPASS GRAFT (LIMA-LAD, SVG-PDA, SVG-OM2); Location: [facility]; Surgeon: Cain Sieve, MD   CYSTOSCOPY W/ URETERAL STENT REMOVAL N/A 10/04/2021   Procedure: CYSTOSCOPY WITH PROSTATE FOREIGN BODY REMOVAL;  Surgeon: Riki Altes, MD;  Location: [facility] ORS;  Service: Urology;  Laterality: N/A;   CYSTOSCOPY WITH INSERTION OF UROLIFT N/A 04/29/2019   Procedure: CYSTOSCOPY WITH INSERTION OF UROLIFT;  Surgeon: Riki Altes, MD;  Location: [facility] ORS;  Service: Urology;  Laterality: N/A;   ESOPHAGOGASTRODUODENOSCOPY (EGD) WITH PROPOFOL N/A 09/11/2016   Procedure: ESOPHAGOGASTRODUODENOSCOPY (EGD) WITH PROPOFOL;  Surgeon: Midge Minium, MD;  Location: [facility] ENDOSCOPY;  Service: Endoscopy;  Laterality: N/A;   ESOPHAGOGASTRODUODENOSCOPY (EGD) WITH PROPOFOL N/A 10/17/2016   Procedure: ESOPHAGOGASTRODUODENOSCOPY (EGD) WITH PROPOFOL;  Surgeon: Midge Minium, MD;  Location: [facility] ENDOSCOPY;  Service: Endoscopy;  Laterality: N/A;   ESOPHAGOGASTRODUODENOSCOPY (EGD) WITH PROPOFOL N/A 11/13/2017   Procedure: ESOPHAGOGASTRODUODENOSCOPY (EGD) WITH PROPOFOL;  Surgeon: Midge Minium, MD;  Location: [facility] ENDOSCOPY;  Service: Endoscopy;  Laterality: N/A;   pins R lower leg Left    rotator cuff replaced Right    SAVORY DILATION N/A 10/17/2016   Procedure: SAVORY DILATION;  Surgeon: Midge Minium, MD;  Location: [facility] ENDOSCOPY;  Service: Endoscopy;  Laterality: N/A;   TRANSURETHRAL INCISION OF PROSTATE N/A 10/04/2021   Procedure: TRANSURETHRAL INCISION OF THE PROSTATE (TUIP);  Surgeon: Riki Altes, MD;  Location: [facility] ORS;  Service: Urology;  Laterality: N/A;    MEDICATIONS:  Prior to Admission medications   Medication Sig Start Date End Date Taking? Authorizing Provider  albuterol (VENTOLIN HFA) 108 (90 Base) MCG/ACT inhaler SMARTSIG:2 inhalation Via Inhaler Every 4 Hours PRN 11/08/21   [provider]  aspirin EC 81 MG tablet Take 1 tablet (81 mg total) by mouth daily at 6 (six) AM. Swallow whole. 06/07/22   Georgiana Spinner, NP  atorvastatin (LIPITOR) 80 MG tablet Take 80 mg by mouth daily.  01/09/16   [provider]  buPROPion (WELLBUTRIN XL) 150 MG 24 hr tablet Take 150 mg by mouth at bedtime. 04/07/20   [provider]  cholecalciferol (VITAMIN D3) 25 MCG (1000 UT) tablet Take 1,000 Units by mouth every evening.    [provider]  clonazePAM (KLONOPIN) 0.5 MG tablet Take 0.5 mg by mouth 2 (two) times daily.    [provider]  clopidogrel (PLAVIX) 75 MG tablet Take 75 mg by mouth daily.  12/21/15   [provider]  Coenzyme Q10 (COQ10) 100 MG CAPS Take 100 mg by mouth at bedtime.    [provider]  Ferrous Sulfate (IRON) 325 (65 Fe) MG TABS Take 325 mg by mouth daily.    [provider]  finasteride (PROSCAR) 5 MG tablet Take 5 mg by mouth daily.    [provider]  fluticasone-salmeterol (ADVAIR) 500-50 MCG/ACT AEPB Inhale into the lungs.    [provider]  glucose blood test strip     [provider]  levofloxacin (LEVAQUIN) 500 MG tablet Take 500 mg by mouth daily. 09/04/22   [provider]  levothyroxine (SYNTHROID, LEVOTHROID) 100 MCG tablet Take 100 mcg by mouth daily before breakfast.    [provider]  loratadine (CLARITIN) 10 MG tablet Take 10 mg by mouth daily.    [provider]  metFORMIN (GLUCOPHAGE) 500 MG tablet Take 500 mg by mouth 2 (two) times daily.  09/07/14   [provider]  metoprolol tartrate (LOPRESSOR) 25 MG tablet Take 25 mg by mouth 2 (two) times daily.    [provider]  Multiple Vitamin (MULTIVITAMIN WITH MINERALS) TABS Take 1 tablet by mouth daily.    [provider]  naproxen sodium (ALEVE) 220 MG tablet Take 220-440 mg by mouth 2 (two) times daily as needed (pain.).    [provider]   oxyCODONE-acetaminophen (PERCOCET/ROXICET) 5-325 MG tablet Take 1 tablet by mouth every 6 (six) hours as needed for moderate pain. 06/06/22   Georgiana Spinner, NP  pantoprazole (PROTONIX) 40 MG tablet TAKE 1 TABLET BY MOUTH EVERYDAY AT BEDTIME 02/05/23   Midge Minium, MD  psyllium (METAMUCIL SMOOTH TEXTURE) 28 % packet Take 1 packet by mouth daily.    [provider]  RA KRILL OIL 500 MG CAPS Take 500 mg by mouth at bedtime.     [provider]  tamsulosin (FLOMAX) 0.4 MG CAPS capsule TAKE 1 CAPSULE BY MOUTH EVERY DAY 05/11/22   Stoioff, Verna Czech, MD  venlafaxine XR (EFFEXOR-XR) 150 MG 24 hr capsule Take 150 mg by mouth daily. 06/29/21   [provider]    Physical Exam   Triage Vital Signs: ED Triage Vitals  Encounter Vitals Group     BP 05/12/23 0046 (!) 130/93     Systolic BP Percentile --      Diastolic BP Percentile --      Pulse Rate 05/12/23 0046 81     Resp 05/12/23 0046 20     Temp 05/12/23 0046 99.8 F (37.7 C)     Temp Source 05/12/23 0046 Oral     SpO2 05/12/23 0046 92 %     Weight 05/12/23 0048 212 lb (96.2 kg)     Height 05/12/23 0048 5\' 11"  (1.803 m)     Head Circumference --      Peak Flow --      Pain Score 05/12/23 0048 0     Pain Loc --      Pain Education --      Exclude from Growth Chart --     Most recent vital signs: Vitals:   05/12/23 0408 05/12/23 0538  BP:  111/67  Pulse:  67  Resp:  18  Temp: 100 F (37.8 C) 97.9 F (36.6 C)  SpO2:  94%    CONSTITUTIONAL: Alert, responds appropriately to questions. Well-appearing; well-nourished HEAD: Normocephalic, atraumatic EYES: Conjunctivae clear, pupils appear equal, sclera nonicteric ENT: normal nose; moist mucous membranes NECK: Supple, normal ROM CARD: RRR; S1 and S2 appreciated RESP: Normal chest excursion without splinting or tachypnea; breath sounds clear and equal bilaterally; no wheezes, no rhonchi, no rales, no hypoxia or respiratory distress, speaking full  sentences ABD/GI: Non-distended; soft, non-tender, no rebound, no guarding, no peritoneal signs GU:  Normal external genitalia, circumcised male, normal penile shaft, no blood or discharge at the urethral meatus, patient is tender to palpation over the left testicle without masses appreciated.  His  scrotum feels warm to touch and is red but there is no induration or fluctuance appreciated.  No crepitus.  No hernias appreciated, 2+ femoral pulses bilaterally; no perineal erythema, warmth, subcutaneous air or crepitus.  Chaperone present for exam. BACK: The back appears normal EXT: Normal ROM in all joints; no deformity noted, no edema SKIN: Normal color for age and race; warm; no rash on exposed skin NEURO: Moves all extremities equally, normal speech PSYCH: The patient's mood and manner are appropriate.   ED Results / Procedures / Treatments   LABS: (all labs ordered are listed, but only abnormal results are displayed) Labs Reviewed  CBC - Abnormal; Notable for the following components:      Result Value   WBC 14.9 (*)    All other components within normal limits  COMPREHENSIVE METABOLIC PANEL - Abnormal; Notable for the following components:   Glucose, Bld 147 (*)    Calcium 8.6 (*)    All other components within normal limits  TROPONIN I (HIGH SENSITIVITY) - Abnormal; Notable for the following components:   Troponin I (High Sensitivity) 19 (*)    All other components within normal limits  TROPONIN I (HIGH SENSITIVITY) - Abnormal; Notable for the following components:   Troponin I (High Sensitivity) 28 (*)    All other components within normal limits  RESP PANEL BY RT-PCR (RSV, FLU A&B, COVID)  RVPGX2  CULTURE, BLOOD (ROUTINE X 2)  CULTURE, BLOOD (ROUTINE X 2)  URINE CULTURE  LACTIC ACID, PLASMA  PROTIME-INR  APTT  URINALYSIS, ROUTINE W REFLEX MICROSCOPIC     EKG:  EKG Interpretation Date/Time:  Saturday May 12 2023 01:16:52 EDT Ventricular Rate:  94 PR  Interval:  202 QRS Duration:  132 QT Interval:  372 QTC Calculation: 465 R Axis:   57  Text Interpretation: Sinus rhythm with Premature supraventricular complexes and with occasional Premature ventricular complexes Non-specific intra-ventricular conduction block Possible Inferior infarct , age undetermined Anterolateral infarct , age undetermined Abnormal ECG When compared with ECG of 23-Apr-2019 10:27, Significant changes have occurred Confirmed by Rochele Raring [phone]) on 05/12/2023 4:25:03 AM         RADIOLOGY: My personal review and interpretation of imaging: CT head negative.  Chest x-ray clear.  I have personally reviewed all radiology reports.   DG Chest Port 1 View  Result Date: 05/12/2023 CLINICAL DATA:  77 year old male with progressive weakness. Possible sepsis. EXAM: PORTABLE CHEST 1 VIEW COMPARISON:  Chest x-ray 09/11/2016. FINDINGS: Lung volumes are normal. No consolidative airspace disease. No pleural effusions. No pneumothorax. No pulmonary nodule or mass noted. Moderate-sized hiatal hernia. Pulmonary vasculature and the cardiomediastinal silhouette are otherwise within normal limits. Atherosclerosis in the thoracic aorta. Status post median sternotomy for CABG. IMPRESSION: 1. No radiographic evidence of acute cardiopulmonary disease. 2. Aortic atherosclerosis. 3. Hiatal hernia again noted. Electronically Signed   By: Trudie Reed M.D.   On: 05/12/2023 06:27   CT HEAD WO CONTRAST ( )  Result Date: 05/12/2023 CLINICAL DATA:  Multiple falls. Neuro deficit, acute, stroke suspected. EXAM: CT HEAD WITHOUT CONTRAST TECHNIQUE: Contiguous axial images were obtained from the base of the skull through the vertex without intravenous contrast. RADIATION DOSE REDUCTION: This exam was performed according to the departmental dose-optimization program which includes automated exposure control, adjustment of the mA and/or kV according to patient size and/or use of iterative  reconstruction technique. 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 There is atrophy and chronic small  vessel disease changes. No acute intracranial abnormality. Specifically, no hemorrhage, hydrocephalus, mass lesion, acute infarction, or significant intracranial injury. Vascular: No hyperdense vessel or unexpected calcification. Skull: No acute calvarial abnormality. 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 Atrophy, chronic microvascular disease. No acute intracranial abnormality. Electronically Signed   By: Charlett Nose M.D.   On: 05/12/2023 03:22     PROCEDURES:  Critical Care performed: Yes, see critical care procedure note(s)   CRITICAL CARE Performed by: Baxter Hire Jahquez Steffler   Total critical care time: 35 minutes  Critical care time was exclusive of separately billable procedures and treating other patients.  Critical care was necessary to treat or prevent imminent or life-threatening deterioration.  Critical care was time spent personally by me on the following activities: development of treatment plan with patient and/or surrogate as well as nursing, discussions with consultants, evaluation of patient's response to treatment, examination of patient, obtaining history from patient or surrogate, ordering and performing treatments and interventions, ordering and review of laboratory studies, ordering and review of radiographic studies, pulse oximetry and re-evaluation of patient's condition.   Marland Kitchen1-3 Lead EKG Interpretation  Performed by: [place] Landgrebe, Layla Maw, DO Authorized by: Constant Mandeville, Layla Maw, DO     Interpretation: normal     ECG rate:  81   ECG rate assessment: normal     Rhythm: sinus rhythm     Ectopy: none     Conduction: normal       IMPRESSION / MDM / ASSESSMENT AND PLAN / ED COURSE  I reviewed the triage vital signs and the nursing  notes.    Patient here with chills, generalized weakness and left testicular pain.  The patient is on the cardiac monitor to evaluate for evidence of arrhythmia and/or significant heart rate changes.   DIFFERENTIAL DIAGNOSIS (includes but not limited to):   Sepsis, UTI, bacteremia, scrotal cellulitis, scrotal abscess, epididymitis, orchitis   Patient's presentation is most consistent with acute presentation with potential threat to life or bodily function.   PLAN: Labs show leukocytosis of 14,000.  Normal creatinine, LFTs.  First troponin is 19.  Second pending.  COVID, flu and RSV negative.  CT head from triage reviewed and interpreted by myself and the radiologist and is unremarkable.  Will obtain chest x-ray, urinalysis and urine culture, scrotal ultrasound with Doppler.  Will give IV fluids, broad-spectrum antibiotics, pain and nausea medicine.  Rectal temperature is 100.  Will give Tylenol.  Anticipate admission.  MEDICATIONS GIVEN IN ED: Medications  vancomycin (VANCOREADY) IVPB 2000 mg/400 mL (2,000 mg Intravenous New Bag/Given 05/12/23 0603)  lactated ringers bolus 1,000 mL (0 mLs Intravenous Stopped 05/12/23 0534)  ceFEPIme (MAXIPIME) 2 g in sodium chloride 0.9 % 100 mL IVPB (0 g Intravenous Stopped 05/12/23 0538)  metroNIDAZOLE (FLAGYL) IVPB 500 mg (0 mg Intravenous Stopped 05/12/23 0559)  fentaNYL (SUBLIMAZE) injection 50 mcg (50 mcg Intravenous Given 05/12/23 0437)  ondansetron (ZOFRAN) injection 4 mg (4 mg Intravenous Given 05/12/23 0436)  acetaminophen (TYLENOL) tablet 1,000 mg (1,000 mg Oral Given 05/12/23 0433)     ED COURSE: Chest x-ray reviewed and interpreted by myself and the radiologist and shows no acute abnormality.  Scrotal ultrasound shows normal blood flow but official read is still pending.  Urine also pending.  Signed out the oncoming ED physician at 7 AM.   CONSULTS: Disposition pending further workup.   OUTSIDE RECORDS REVIEWED: Reviewed last  urology note on 04/12/2023 at [facility].  "Lenice Pressman 77  y.o. year old with complex GU history including BPH/LUTS status post UroLift by outside provider complicated by retention/urinary tract infections and urotiming migration status post subsequent OSH TURP (19g on TRUS by me on 07/07/22) on finasteride further complicated by high postvoid residuals now presenting for diagnostic cystoscopy to rule out recurrent bulbar urethral stricture. Please see most recent note by me 02/08/2023 for further details. Of note he is currently on finasteride holiday to see if this was the source of her recent allergic reaction. Also of note urine culture from 02/08/2023 grew out Pseudomonas that was pansensitive. He was instructed to take ciprofloxacin 500 mg p.o. twice daily x 7 days and then 48 hours ahead of today cystoscopy."      FINAL CLINICAL IMPRESSION(S) / ED DIAGNOSES   Final diagnoses:  Left testicular pain  Generalized weakness     Rx / DC Orders   ED Discharge Orders     None        Note:  This document was prepared using Dragon voice recognition software and may include unintentional dictation errors.   Jhovanny Guinta, Layla Maw, DO 05/12/23 [phone]

## 2023-05-12 NOTE — Progress Notes (Signed)
CODE SEPSIS - PHARMACY COMMUNICATION  **Broad Spectrum Antibiotics should be administered within 1 hour of Sepsis diagnosis**  Time Code Sepsis Called/Page Received:  10/12 @ 0409   Antibiotics Ordered:  Cefepime , Vancomycin   Time of 1st antibiotic administration: Cefepime 2 gm IV X 1 on 10/12 @ 0506   Additional action taken by pharmacy:   If necessary, Name of Provider/Nurse Contacted:     Ermagene Saidi D ,PharmD Clinical Pharmacist  05/12/2023  5:42 AM

## 2023-05-12 NOTE — Assessment & Plan Note (Signed)
Finasteride

## 2023-05-13 DIAGNOSIS — N50812 Left testicular pain: Principal | ICD-10-CM

## 2023-05-13 DIAGNOSIS — R82998 Other abnormal findings in urine: Secondary | ICD-10-CM

## 2023-05-13 DIAGNOSIS — N433 Hydrocele, unspecified: Secondary | ICD-10-CM

## 2023-05-13 DIAGNOSIS — R531 Weakness: Secondary | ICD-10-CM

## 2023-05-13 DIAGNOSIS — I639 Cerebral infarction, unspecified: Secondary | ICD-10-CM

## 2023-05-13 DIAGNOSIS — N492 Inflammatory disorders of scrotum: Secondary | ICD-10-CM

## 2023-05-13 LAB — LIPID PANEL
Cholesterol: 110 mg/dL (ref 0–200)
HDL: 42 mg/dL (ref >40)
LDL Cholesterol: 59 mg/dL (ref 0–99)
Total CHOL/HDL Ratio: 2.6 {ratio}
Triglycerides: 46 mg/dL (ref <150)
VLDL: 9 mg/dL (ref 0–40)

## 2023-05-13 LAB — GLUCOSE, CAPILLARY
Glucose-Capillary: 134 mg/dL — ABNORMAL HIGH (ref 70–99)
Glucose-Capillary: 113 mg/dL — ABNORMAL HIGH (ref 70–99)
Glucose-Capillary: 97 mg/dL (ref 70–99)
Glucose-Capillary: 166 mg/dL — ABNORMAL HIGH (ref 70–99)

## 2023-05-13 MED ORDER — ACETAMINOPHEN 325 MG PO TABS
650.0000 mg | ORAL_TABLET | Freq: Four times a day (QID) | ORAL | Status: DC | PRN
  Administered 2023-05-13: 650 mg via ORAL
  Filled 2023-05-13: qty 2

## 2023-05-13 MED ORDER — MICONAZOLE NITRATE 2 % EX CREA
TOPICAL_CREAM | Freq: Three times a day (TID) | CUTANEOUS | Status: DC
  Administered 2023-05-14: 1 via TOPICAL
  Filled 2023-05-13 (×2): qty 14

## 2023-05-13 NOTE — Progress Notes (Signed)
Birth:  1946-02-28      BSA:          2.161 m Patient Age:    77 years       BP:           139/61 mmHg Patient Gender: M              HR:           55 bpm. Exam Location:  [facility] Procedure: 2D Echo and Intracardiac Opacification Agent Indications:     TIA G45.9  History:         Patient has prior history of Echocardiogram examinations, most                  recent 05/13/2012.  Sonographer:     Overton Mam RDCS, FASE Referring Phys:  [id number] Francoise Schaumann [place] Diagnosing Phys: Lennie Odor MD  Sonographer Comments: Technically difficult study due to poor echo windows, suboptimal apical window and no subcostal window. Image acquisition challenging due to respiratory motion. IMPRESSIONS  1. Left ventricular ejection fraction, by estimation, is 35 to 40%. The left ventricle has moderately decreased function. The left ventricle demonstrates regional wall motion abnormalities (see scoring diagram/findings for description). The left ventricular internal cavity size was mildly dilated. Left ventricular diastolic parameters are consistent with Grade I diastolic dysfunction (impaired relaxation).  2.  Right ventricular systolic function is normal. The right ventricular size is normal. Tricuspid regurgitation signal is inadequate for assessing PA pressure.  3. The mitral valve is grossly normal. Trivial mitral valve regurgitation. No evidence of mitral stenosis.  4. The aortic valve is tricuspid. There is mild calcification of the aortic valve. Aortic valve regurgitation is not visualized. Aortic valve sclerosis is present, with no evidence of aortic valve stenosis. FINDINGS  Left Ventricle: Left ventricular ejection fraction, by estimation, is 35 to 40%. The left ventricle has moderately decreased function. The left ventricle demonstrates regional wall motion abnormalities. Definity contrast agent was given IV to delineate the left ventricular endocardial borders. The left ventricular internal cavity size was mildly dilated. There is no left ventricular hypertrophy. Abnormal (paradoxical) septal motion consistent with post-operative status. Left ventricular diastolic parameters are consistent with Grade I diastolic dysfunction (impaired relaxation).  LV Wall Scoring: The posterior wall and basal inferior segment are hypokinetic. Right Ventricle: The right ventricular size is normal. No increase in right ventricular wall thickness. Right ventricular systolic function is normal. Tricuspid regurgitation signal is inadequate for assessing PA pressure. Left Atrium: Left atrial size was normal in size. Right Atrium: Right atrial size was normal in size. Pericardium: There is no evidence of pericardial effusion. Mitral Valve: The mitral valve is grossly normal. Trivial mitral valve regurgitation. No evidence of mitral valve stenosis. Tricuspid Valve: The tricuspid valve is grossly normal. Tricuspid valve regurgitation is not demonstrated. No evidence of tricuspid stenosis. Aortic Valve: The aortic valve is tricuspid. There is mild calcification of the aortic valve. Aortic valve regurgitation is not visualized. Aortic  valve sclerosis is present, with no evidence of aortic valve stenosis. Aortic valve peak gradient measures 13.8 mmHg. Pulmonic Valve: The pulmonic valve was not well visualized. Pulmonic valve regurgitation is not visualized. Aorta: The aortic root and ascending aorta are structurally normal, with no evidence of dilitation. Venous: The inferior vena cava was not well visualized. IAS/Shunts: The atrial septum is grossly normal.  LEFT VENTRICLE PLAX 2D LVIDd:         6.20 cm     Diastology LVIDs:  PROGRESS NOTE  Paul Bass    DOB: 07/06/1946, 77 y.o.  [id number]    Code Status: Prior   DOA: 05/12/2023   LOS: 1   Brief hospital course   Paul Bass is a 77 y.o. male with medical history significant of CAD s/p CABG, COPD, type 2 diabetes, CVA, BPH, HLD, CHF, carotid artery stenosis, dysphagia, hypothyroidism, OSA, anxiety, bradycardia.  They presented due to chills, weakness, and left-sided testicular pain for several days. Noted baseline history of CVA with right-sided deficits.  This is acutely worsened. report worsening scrotal and groin redness pain and swelling. Baseline type 2 diabetes.  Blood sugars have been in the 130s at home.  In the ER: Tmax of 100, hemodynamically stable.  Satting well room air.  White count 15, hemoglobin 13.9, platelets 160, urinalysis mildly indicative of infection. Troponin 19-28.  COVID flu and RSV negative.  Lactate within normal limits.   CT head- negative acute intracranial abnormality. Old left MCA infarct with left frontal encephalomalacia. Atrophy, chronic microvascular disease. Brain MRI- punctate acute or subacute infarct in the Right superior cerebellum. No associated hemorrhage or mass effect. scrotal ultrasound: Bilateral hydroceles, simple on the right and complex (multiseptated) left. 2. Bilateral varicoceles. 3. Epididymides are enlarged and heterogeneous in appearance bilaterally, with multiple epididymal cysts and/or spermatoceles, and multiple calcifications. 4. No evidence of testicular torsion, or findings suggestive of epididymo-orchitis.  They were initially treated with IV rocephin, flagyl, vancomycin as well as topical miconazole cream.   Patient was admitted to medicine service for further workup and management of scrotal cellulitis and stroke as outlined in detail below.  05/13/23 -stable, improved. Evaluated by neuro and urology   Assessment & Plan  Principal Problem:   Cellulitis Active Problems:    Cerebrovascular accident (CVA) due to embolism ([name])   Cellulitis of groin   CAD (coronary artery disease)   COPD (chronic obstructive pulmonary disease) ([name])   Benign prostatic hyperplasia with incomplete bladder emptying   GERD (gastroesophageal reflux disease)   HLD (hyperlipidemia)   Type 2 diabetes mellitus without complications ([name])   Chronic systolic CHF (congestive heart failure), NYHA class 2 ([name])  Cerebrovascular accident (CVA) due to embolism ([name])- symptoms have resolved. Brain MRI: punctate acute or subacute infarct in the Right superior cerebellum. No associated hemorrhage or mass effect. - neuro evaluated- no changes in management at this time - PT/OT, SLP - continue ASA, plavix, statin   Scrotal cellulitis:   scrotal US:Bilateral hydroceles, simple on the right and complex (multiseptated) left. 2. Bilateral varicoceles. 3. Epididymides are enlarged and heterogeneous in appearance bilaterally, with multiple epididymal cysts and/or spermatoceles, and multiple calcifications. 4. No evidence of testicular torsion, or findings suggestive of epididymo-orchitis. - urology evaluated and recommended IV antibiotics, scrotal support - continue IV rocephin, flagyl, vancomycin for infectious coverage  - continue topical miconazole  - counseled on continuing good diabetes control  - analgesia PRN   HFpEF  CAD s/p CABG- 2017.  2D ECHO 07/2021 w/ EF 50% and grade 1 diastolic dysfunction. No active CP  - Cont home regimen   - atorvastatin, metoprolol  Type 2 diabetes mellitus without complications ([name])- A1c 6.1 - SSI   GERD (gastroesophageal reflux disease) PPI      Benign prostatic hyperplasia with incomplete bladder emptying Finasteride     COPD (chronic obstructive pulmonary disease) ([name]) Stable from a resp standpoint  Cont home inhaler   Body mass index is 29.49 kg/m.  VTE ppx: enoxaparin (LOVENOX) injection 40  PROGRESS NOTE  Paul Bass    DOB: 07/06/1946, 77 y.o.  [id number]    Code Status: Prior   DOA: 05/12/2023   LOS: 1   Brief hospital course   Paul Bass is a 77 y.o. male with medical history significant of CAD s/p CABG, COPD, type 2 diabetes, CVA, BPH, HLD, CHF, carotid artery stenosis, dysphagia, hypothyroidism, OSA, anxiety, bradycardia.  They presented due to chills, weakness, and left-sided testicular pain for several days. Noted baseline history of CVA with right-sided deficits.  This is acutely worsened. report worsening scrotal and groin redness pain and swelling. Baseline type 2 diabetes.  Blood sugars have been in the 130s at home.  In the ER: Tmax of 100, hemodynamically stable.  Satting well room air.  White count 15, hemoglobin 13.9, platelets 160, urinalysis mildly indicative of infection. Troponin 19-28.  COVID flu and RSV negative.  Lactate within normal limits.   CT head- negative acute intracranial abnormality. Old left MCA infarct with left frontal encephalomalacia. Atrophy, chronic microvascular disease. Brain MRI- punctate acute or subacute infarct in the Right superior cerebellum. No associated hemorrhage or mass effect. scrotal ultrasound: Bilateral hydroceles, simple on the right and complex (multiseptated) left. 2. Bilateral varicoceles. 3. Epididymides are enlarged and heterogeneous in appearance bilaterally, with multiple epididymal cysts and/or spermatoceles, and multiple calcifications. 4. No evidence of testicular torsion, or findings suggestive of epididymo-orchitis.  They were initially treated with IV rocephin, flagyl, vancomycin as well as topical miconazole cream.   Patient was admitted to medicine service for further workup and management of scrotal cellulitis and stroke as outlined in detail below.  05/13/23 -stable, improved. Evaluated by neuro and urology   Assessment & Plan  Principal Problem:   Cellulitis Active Problems:    Cerebrovascular accident (CVA) due to embolism ([name])   Cellulitis of groin   CAD (coronary artery disease)   COPD (chronic obstructive pulmonary disease) ([name])   Benign prostatic hyperplasia with incomplete bladder emptying   GERD (gastroesophageal reflux disease)   HLD (hyperlipidemia)   Type 2 diabetes mellitus without complications ([name])   Chronic systolic CHF (congestive heart failure), NYHA class 2 ([name])  Cerebrovascular accident (CVA) due to embolism ([name])- symptoms have resolved. Brain MRI: punctate acute or subacute infarct in the Right superior cerebellum. No associated hemorrhage or mass effect. - neuro evaluated- no changes in management at this time - PT/OT, SLP - continue ASA, plavix, statin   Scrotal cellulitis:   scrotal US:Bilateral hydroceles, simple on the right and complex (multiseptated) left. 2. Bilateral varicoceles. 3. Epididymides are enlarged and heterogeneous in appearance bilaterally, with multiple epididymal cysts and/or spermatoceles, and multiple calcifications. 4. No evidence of testicular torsion, or findings suggestive of epididymo-orchitis. - urology evaluated and recommended IV antibiotics, scrotal support - continue IV rocephin, flagyl, vancomycin for infectious coverage  - continue topical miconazole  - counseled on continuing good diabetes control  - analgesia PRN   HFpEF  CAD s/p CABG- 2017.  2D ECHO 07/2021 w/ EF 50% and grade 1 diastolic dysfunction. No active CP  - Cont home regimen   - atorvastatin, metoprolol  Type 2 diabetes mellitus without complications ([name])- A1c 6.1 - SSI   GERD (gastroesophageal reflux disease) PPI      Benign prostatic hyperplasia with incomplete bladder emptying Finasteride     COPD (chronic obstructive pulmonary disease) ([name]) Stable from a resp standpoint  Cont home inhaler   Body mass index is 29.49 kg/m.  VTE ppx: enoxaparin (LOVENOX) injection 40  PROGRESS NOTE  Paul Bass    DOB: 07/06/1946, 77 y.o.  [id number]    Code Status: Prior   DOA: 05/12/2023   LOS: 1   Brief hospital course   Paul Bass is a 77 y.o. male with medical history significant of CAD s/p CABG, COPD, type 2 diabetes, CVA, BPH, HLD, CHF, carotid artery stenosis, dysphagia, hypothyroidism, OSA, anxiety, bradycardia.  They presented due to chills, weakness, and left-sided testicular pain for several days. Noted baseline history of CVA with right-sided deficits.  This is acutely worsened. report worsening scrotal and groin redness pain and swelling. Baseline type 2 diabetes.  Blood sugars have been in the 130s at home.  In the ER: Tmax of 100, hemodynamically stable.  Satting well room air.  White count 15, hemoglobin 13.9, platelets 160, urinalysis mildly indicative of infection. Troponin 19-28.  COVID flu and RSV negative.  Lactate within normal limits.   CT head- negative acute intracranial abnormality. Old left MCA infarct with left frontal encephalomalacia. Atrophy, chronic microvascular disease. Brain MRI- punctate acute or subacute infarct in the Right superior cerebellum. No associated hemorrhage or mass effect. scrotal ultrasound: Bilateral hydroceles, simple on the right and complex (multiseptated) left. 2. Bilateral varicoceles. 3. Epididymides are enlarged and heterogeneous in appearance bilaterally, with multiple epididymal cysts and/or spermatoceles, and multiple calcifications. 4. No evidence of testicular torsion, or findings suggestive of epididymo-orchitis.  They were initially treated with IV rocephin, flagyl, vancomycin as well as topical miconazole cream.   Patient was admitted to medicine service for further workup and management of scrotal cellulitis and stroke as outlined in detail below.  05/13/23 -stable, improved. Evaluated by neuro and urology   Assessment & Plan  Principal Problem:   Cellulitis Active Problems:    Cerebrovascular accident (CVA) due to embolism ([name])   Cellulitis of groin   CAD (coronary artery disease)   COPD (chronic obstructive pulmonary disease) ([name])   Benign prostatic hyperplasia with incomplete bladder emptying   GERD (gastroesophageal reflux disease)   HLD (hyperlipidemia)   Type 2 diabetes mellitus without complications ([name])   Chronic systolic CHF (congestive heart failure), NYHA class 2 ([name])  Cerebrovascular accident (CVA) due to embolism ([name])- symptoms have resolved. Brain MRI: punctate acute or subacute infarct in the Right superior cerebellum. No associated hemorrhage or mass effect. - neuro evaluated- no changes in management at this time - PT/OT, SLP - continue ASA, plavix, statin   Scrotal cellulitis:   scrotal US:Bilateral hydroceles, simple on the right and complex (multiseptated) left. 2. Bilateral varicoceles. 3. Epididymides are enlarged and heterogeneous in appearance bilaterally, with multiple epididymal cysts and/or spermatoceles, and multiple calcifications. 4. No evidence of testicular torsion, or findings suggestive of epididymo-orchitis. - urology evaluated and recommended IV antibiotics, scrotal support - continue IV rocephin, flagyl, vancomycin for infectious coverage  - continue topical miconazole  - counseled on continuing good diabetes control  - analgesia PRN   HFpEF  CAD s/p CABG- 2017.  2D ECHO 07/2021 w/ EF 50% and grade 1 diastolic dysfunction. No active CP  - Cont home regimen   - atorvastatin, metoprolol  Type 2 diabetes mellitus without complications ([name])- A1c 6.1 - SSI   GERD (gastroesophageal reflux disease) PPI      Benign prostatic hyperplasia with incomplete bladder emptying Finasteride     COPD (chronic obstructive pulmonary disease) ([name]) Stable from a resp standpoint  Cont home inhaler   Body mass index is 29.49 kg/m.  VTE ppx: enoxaparin (LOVENOX) injection 40  Birth:  1946-02-28      BSA:          2.161 m Patient Age:    77 years       BP:           139/61 mmHg Patient Gender: M              HR:           55 bpm. Exam Location:  [facility] Procedure: 2D Echo and Intracardiac Opacification Agent Indications:     TIA G45.9  History:         Patient has prior history of Echocardiogram examinations, most                  recent 05/13/2012.  Sonographer:     Overton Mam RDCS, FASE Referring Phys:  [id number] Francoise Schaumann [place] Diagnosing Phys: Lennie Odor MD  Sonographer Comments: Technically difficult study due to poor echo windows, suboptimal apical window and no subcostal window. Image acquisition challenging due to respiratory motion. IMPRESSIONS  1. Left ventricular ejection fraction, by estimation, is 35 to 40%. The left ventricle has moderately decreased function. The left ventricle demonstrates regional wall motion abnormalities (see scoring diagram/findings for description). The left ventricular internal cavity size was mildly dilated. Left ventricular diastolic parameters are consistent with Grade I diastolic dysfunction (impaired relaxation).  2.  Right ventricular systolic function is normal. The right ventricular size is normal. Tricuspid regurgitation signal is inadequate for assessing PA pressure.  3. The mitral valve is grossly normal. Trivial mitral valve regurgitation. No evidence of mitral stenosis.  4. The aortic valve is tricuspid. There is mild calcification of the aortic valve. Aortic valve regurgitation is not visualized. Aortic valve sclerosis is present, with no evidence of aortic valve stenosis. FINDINGS  Left Ventricle: Left ventricular ejection fraction, by estimation, is 35 to 40%. The left ventricle has moderately decreased function. The left ventricle demonstrates regional wall motion abnormalities. Definity contrast agent was given IV to delineate the left ventricular endocardial borders. The left ventricular internal cavity size was mildly dilated. There is no left ventricular hypertrophy. Abnormal (paradoxical) septal motion consistent with post-operative status. Left ventricular diastolic parameters are consistent with Grade I diastolic dysfunction (impaired relaxation).  LV Wall Scoring: The posterior wall and basal inferior segment are hypokinetic. Right Ventricle: The right ventricular size is normal. No increase in right ventricular wall thickness. Right ventricular systolic function is normal. Tricuspid regurgitation signal is inadequate for assessing PA pressure. Left Atrium: Left atrial size was normal in size. Right Atrium: Right atrial size was normal in size. Pericardium: There is no evidence of pericardial effusion. Mitral Valve: The mitral valve is grossly normal. Trivial mitral valve regurgitation. No evidence of mitral valve stenosis. Tricuspid Valve: The tricuspid valve is grossly normal. Tricuspid valve regurgitation is not demonstrated. No evidence of tricuspid stenosis. Aortic Valve: The aortic valve is tricuspid. There is mild calcification of the aortic valve. Aortic valve regurgitation is not visualized. Aortic  valve sclerosis is present, with no evidence of aortic valve stenosis. Aortic valve peak gradient measures 13.8 mmHg. Pulmonic Valve: The pulmonic valve was not well visualized. Pulmonic valve regurgitation is not visualized. Aorta: The aortic root and ascending aorta are structurally normal, with no evidence of dilitation. Venous: The inferior vena cava was not well visualized. IAS/Shunts: The atrial septum is grossly normal.  LEFT VENTRICLE PLAX 2D LVIDd:         6.20 cm     Diastology LVIDs:  Birth:  1946-02-28      BSA:          2.161 m Patient Age:    77 years       BP:           139/61 mmHg Patient Gender: M              HR:           55 bpm. Exam Location:  [facility] Procedure: 2D Echo and Intracardiac Opacification Agent Indications:     TIA G45.9  History:         Patient has prior history of Echocardiogram examinations, most                  recent 05/13/2012.  Sonographer:     Overton Mam RDCS, FASE Referring Phys:  [id number] Francoise Schaumann [place] Diagnosing Phys: Lennie Odor MD  Sonographer Comments: Technically difficult study due to poor echo windows, suboptimal apical window and no subcostal window. Image acquisition challenging due to respiratory motion. IMPRESSIONS  1. Left ventricular ejection fraction, by estimation, is 35 to 40%. The left ventricle has moderately decreased function. The left ventricle demonstrates regional wall motion abnormalities (see scoring diagram/findings for description). The left ventricular internal cavity size was mildly dilated. Left ventricular diastolic parameters are consistent with Grade I diastolic dysfunction (impaired relaxation).  2.  Right ventricular systolic function is normal. The right ventricular size is normal. Tricuspid regurgitation signal is inadequate for assessing PA pressure.  3. The mitral valve is grossly normal. Trivial mitral valve regurgitation. No evidence of mitral stenosis.  4. The aortic valve is tricuspid. There is mild calcification of the aortic valve. Aortic valve regurgitation is not visualized. Aortic valve sclerosis is present, with no evidence of aortic valve stenosis. FINDINGS  Left Ventricle: Left ventricular ejection fraction, by estimation, is 35 to 40%. The left ventricle has moderately decreased function. The left ventricle demonstrates regional wall motion abnormalities. Definity contrast agent was given IV to delineate the left ventricular endocardial borders. The left ventricular internal cavity size was mildly dilated. There is no left ventricular hypertrophy. Abnormal (paradoxical) septal motion consistent with post-operative status. Left ventricular diastolic parameters are consistent with Grade I diastolic dysfunction (impaired relaxation).  LV Wall Scoring: The posterior wall and basal inferior segment are hypokinetic. Right Ventricle: The right ventricular size is normal. No increase in right ventricular wall thickness. Right ventricular systolic function is normal. Tricuspid regurgitation signal is inadequate for assessing PA pressure. Left Atrium: Left atrial size was normal in size. Right Atrium: Right atrial size was normal in size. Pericardium: There is no evidence of pericardial effusion. Mitral Valve: The mitral valve is grossly normal. Trivial mitral valve regurgitation. No evidence of mitral valve stenosis. Tricuspid Valve: The tricuspid valve is grossly normal. Tricuspid valve regurgitation is not demonstrated. No evidence of tricuspid stenosis. Aortic Valve: The aortic valve is tricuspid. There is mild calcification of the aortic valve. Aortic valve regurgitation is not visualized. Aortic  valve sclerosis is present, with no evidence of aortic valve stenosis. Aortic valve peak gradient measures 13.8 mmHg. Pulmonic Valve: The pulmonic valve was not well visualized. Pulmonic valve regurgitation is not visualized. Aorta: The aortic root and ascending aorta are structurally normal, with no evidence of dilitation. Venous: The inferior vena cava was not well visualized. IAS/Shunts: The atrial septum is grossly normal.  LEFT VENTRICLE PLAX 2D LVIDd:         6.20 cm     Diastology LVIDs:  Birth:  1946-02-28      BSA:          2.161 m Patient Age:    77 years       BP:           139/61 mmHg Patient Gender: M              HR:           55 bpm. Exam Location:  [facility] Procedure: 2D Echo and Intracardiac Opacification Agent Indications:     TIA G45.9  History:         Patient has prior history of Echocardiogram examinations, most                  recent 05/13/2012.  Sonographer:     Overton Mam RDCS, FASE Referring Phys:  [id number] Francoise Schaumann [place] Diagnosing Phys: Lennie Odor MD  Sonographer Comments: Technically difficult study due to poor echo windows, suboptimal apical window and no subcostal window. Image acquisition challenging due to respiratory motion. IMPRESSIONS  1. Left ventricular ejection fraction, by estimation, is 35 to 40%. The left ventricle has moderately decreased function. The left ventricle demonstrates regional wall motion abnormalities (see scoring diagram/findings for description). The left ventricular internal cavity size was mildly dilated. Left ventricular diastolic parameters are consistent with Grade I diastolic dysfunction (impaired relaxation).  2.  Right ventricular systolic function is normal. The right ventricular size is normal. Tricuspid regurgitation signal is inadequate for assessing PA pressure.  3. The mitral valve is grossly normal. Trivial mitral valve regurgitation. No evidence of mitral stenosis.  4. The aortic valve is tricuspid. There is mild calcification of the aortic valve. Aortic valve regurgitation is not visualized. Aortic valve sclerosis is present, with no evidence of aortic valve stenosis. FINDINGS  Left Ventricle: Left ventricular ejection fraction, by estimation, is 35 to 40%. The left ventricle has moderately decreased function. The left ventricle demonstrates regional wall motion abnormalities. Definity contrast agent was given IV to delineate the left ventricular endocardial borders. The left ventricular internal cavity size was mildly dilated. There is no left ventricular hypertrophy. Abnormal (paradoxical) septal motion consistent with post-operative status. Left ventricular diastolic parameters are consistent with Grade I diastolic dysfunction (impaired relaxation).  LV Wall Scoring: The posterior wall and basal inferior segment are hypokinetic. Right Ventricle: The right ventricular size is normal. No increase in right ventricular wall thickness. Right ventricular systolic function is normal. Tricuspid regurgitation signal is inadequate for assessing PA pressure. Left Atrium: Left atrial size was normal in size. Right Atrium: Right atrial size was normal in size. Pericardium: There is no evidence of pericardial effusion. Mitral Valve: The mitral valve is grossly normal. Trivial mitral valve regurgitation. No evidence of mitral valve stenosis. Tricuspid Valve: The tricuspid valve is grossly normal. Tricuspid valve regurgitation is not demonstrated. No evidence of tricuspid stenosis. Aortic Valve: The aortic valve is tricuspid. There is mild calcification of the aortic valve. Aortic valve regurgitation is not visualized. Aortic  valve sclerosis is present, with no evidence of aortic valve stenosis. Aortic valve peak gradient measures 13.8 mmHg. Pulmonic Valve: The pulmonic valve was not well visualized. Pulmonic valve regurgitation is not visualized. Aorta: The aortic root and ascending aorta are structurally normal, with no evidence of dilitation. Venous: The inferior vena cava was not well visualized. IAS/Shunts: The atrial septum is grossly normal.  LEFT VENTRICLE PLAX 2D LVIDd:         6.20 cm     Diastology LVIDs:

## 2023-05-13 NOTE — Evaluation (Signed)
Speech Language Pathology Evaluation Patient Details Name: Paul Bass MRN: [id number] DOB: [birth year] Today's Date: 05/13/2023 Time: 1100-1120 SLP Time Calculation (min) (ACUTE ONLY): 20 min  Problem List:  Patient Active Problem List   Diagnosis Date Noted   Cellulitis 05/12/2023   Cellulitis of groin 05/12/2023   Carotid stenosis, symptomatic, with infarction ([name]) 06/05/2022   History of colonic polyps    Anxiety state 11/17/2021   Cognitive deficit, post-stroke 11/17/2021   Ex-smoker 11/17/2021   Bradycardia 02/03/2021   Chronic systolic CHF (congestive heart failure), NYHA class 2 ([name]) 05/27/2020   Accelerated junctional rhythm 04/21/2019   Encounter for screening colonoscopy    Polyp of colon    CAD (coronary artery disease) 01/14/2019   Depression 01/14/2019   GERD (gastroesophageal reflux disease) 01/14/2019   Periodic limb movement disorder (PLMD) 01/14/2019   Sleep apnea 01/14/2019   Type 2 diabetes mellitus without complications ([name]) 01/14/2019   Stricture and stenosis of esophagus    Problems with swallowing and mastication    Food impaction of esophagus    H/O coronary artery bypass surgery 01/17/2016   Cerebrovascular accident (CVA) due to embolism ([name]) 01/03/2016   Hypothyroidism, unspecified 01/03/2016   HLD (hyperlipidemia) 01/03/2016   Stable angina ([name]) 11/29/2015   Benign essential hypertension 05/27/2015   Bilateral carotid artery stenosis 02/24/2015   Atherosclerotic peripheral vascular disease ([name]) 09/07/2014   Bladder calculus 08/12/2014   Primary osteoarthritis of left knee 03/10/2014   Elevated prostate specific antigen (PSA) 02/05/2013   Snoring 01/22/2013   Sleepiness 01/22/2013   Dysphagia 01/22/2013   Occlusion and stenosis of carotid artery without mention of cerebral infarction 01/22/2013   Cerebral thrombosis with cerebral infarction ([name]) 01/22/2013   Benign prostatic hyperplasia with incomplete bladder emptying 08/26/2012    Chronic prostatitis 08/26/2012   Family history of malignant neoplasm of prostate 08/26/2012   Incomplete emptying of bladder 08/26/2012   Dysphagia 05/14/2012   Global aphasia 05/14/2012   Stroke, acute, thrombotic ([name]) 05/12/2012   Symptomatic carotid artery stenosis 05/12/2012   HTN (hypertension) 05/12/2012   COPD (chronic obstructive pulmonary disease) ([name]) 05/12/2012   Acute respiratory failure ([name]) 05/12/2012   Past Medical History:  Past Medical History:  Diagnosis Date   Accelerated junctional rhythm    Anemia    Anxiety    a.) Tx'd with BZO PRN   Arthritis    Bilateral carotid artery disease ([name])    BPH (benign prostatic hypertrophy)    CAD S/P percutaneous coronary angioplasty    a.) PCI in 2002 placing a RCA stent (unknown type). b.) LHC 12/07/2015: 5% ISR m-dRCA, 90% dRCA, 25% pRCA, 25% p-mLCx, 100% OM3, 95% oOM2-OM2, 75% m-dLAD; refer to CVTS. c.) 3v CABG 01/03/2016   CHF (congestive heart failure) ([name])    a.) TTE 05/12/2012: EF 50-55%; mild LA enlargement; G1DD. b.)  TTE 11/26/2015: EF 45%; mild BAE; triv PR, mild MR/TR; inferolateral HK; G1DD. c.)  TTE 11/07/2017: EF 35%; RV enlargement; BAE; inferior and inferolateral HK; triv PR; mild MR/TR. d.)  TTE 09/17/2019: EF 35%; mild LVH; triv MR/TR. e.)  TTE 08/11/2021: EF 50%; triv MR/TR; G1DD.   Cognitive deficit following cerebrovascular accident (CVA)    COPD (chronic obstructive pulmonary disease) ([name])    Cortical cataract    Depression    Dyspnea    GERD (gastroesophageal reflux disease)    History of 2019 novel coronavirus disease (COVID-19) 01/19/2021   History of acute inferior wall MI 2002   a.) PCI was performed placing  a RCA stent (unknown type)   History of kidney stones    HTN (hypertension)    Hyperlipidemia    Hypothyroidism    Long term current use of antithrombotics/antiplatelets    a.) DAPT therapy (ASA + clopidogrel)   OSA (obstructive sleep apnea)    a.) does not require nocturnal PAP  therapy   PLMD (periodic limb movement disorder)    Postoperative atrial fibrillation ([name]) 01/03/2016   a.) following CABG procedure   Prostatitis    PVD (peripheral vascular disease) ([name])    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 b/) residual RIGHT sided weakness   T2DM (type 2 diabetes mellitus) ([name])    Tobacco abuse    Past Surgical History:  Past Surgical History:  Procedure Laterality Date   CARDIAC CATHETERIZATION N/A 12/07/2015   Procedure: Left Heart Cath and Coronary Angiography;  Surgeon: Lamar Blinks, MD;  Location: [facility]IVE CV [facility];  Service: Cardiovascular;  Laterality: N/A;   CAROTID ANGIOGRAPHY N/A 06/05/2022   Procedure: CAROTID ANGIOGRAPHY;  Surgeon: Annice Needy, MD;  Location: [facility];  Service: Cardiovascular;  Laterality: N/A;   CAROTID PTA/STENT INTERVENTION Left    HX: 2 stents   CATARACT EXTRACTION W/PHACO Left 03/24/2021   Procedure: CATARACT EXTRACTION PHACO AND INTRAOCULAR LENS PLACEMENT (IOC) LEFT DIABETIC 6.67 00:42.5;  Surgeon: Galen Manila, MD;  Location: [facility] ORS;  Service: Ophthalmology;  Laterality: Left;   COLONOSCOPY WITH PROPOFOL N/A 02/18/2019   Procedure: COLONOSCOPY WITH PROPOFOL;  Surgeon: Midge Minium, MD;  Location: [facility] ENDOSCOPY;  Service: Endoscopy;  Laterality: N/A;   COLONOSCOPY WITH PROPOFOL N/A 12/22/2021   Procedure: COLONOSCOPY WITH PROPOFOL;  Surgeon: Midge Minium, MD;  Location: [facility] ENDOSCOPY;  Service: Endoscopy;  Laterality: N/A;   CORONARY ANGIOPLASTY WITH STENT PLACEMENT Left 2002   CORONARY ARTERY BYPASS GRAFT N/A 01/03/2016   Procedure: 3v CORONARY ARTERY BYPASS GRAFT (LIMA-LAD, SVG-PDA, SVG-OM2); Location: [facility]; Surgeon: Cain Sieve, MD   CYSTOSCOPY W/  URETERAL STENT REMOVAL N/A 10/04/2021   Procedure: CYSTOSCOPY WITH PROSTATE FOREIGN BODY REMOVAL;  Surgeon: Riki Altes, MD;  Location: [facility] ORS;  Service: Urology;  Laterality: N/A;   CYSTOSCOPY WITH INSERTION OF UROLIFT N/A 04/29/2019   Procedure: CYSTOSCOPY WITH INSERTION OF UROLIFT;  Surgeon: Riki Altes, MD;  Location: [facility] ORS;  Service: Urology;  Laterality: N/A;   ESOPHAGOGASTRODUODENOSCOPY (EGD) WITH PROPOFOL N/A 09/11/2016   Procedure: ESOPHAGOGASTRODUODENOSCOPY (EGD) WITH PROPOFOL;  Surgeon: Midge Minium, MD;  Location: [facility] ENDOSCOPY;  Service: Endoscopy;  Laterality: N/A;   ESOPHAGOGASTRODUODENOSCOPY (EGD) WITH PROPOFOL N/A 10/17/2016   Procedure: ESOPHAGOGASTRODUODENOSCOPY (EGD) WITH PROPOFOL;  Surgeon: Midge Minium, MD;  Location: [facility] ENDOSCOPY;  Service: Endoscopy;  Laterality: N/A;   ESOPHAGOGASTRODUODENOSCOPY (EGD) WITH PROPOFOL N/A 11/13/2017   Procedure: ESOPHAGOGASTRODUODENOSCOPY (EGD) WITH PROPOFOL;  Surgeon: Midge Minium, MD;  Location: [facility] ENDOSCOPY;  Service: Endoscopy;  Laterality: N/A;   pins R lower leg Left    rotator cuff replaced Right    SAVORY DILATION N/A 10/17/2016   Procedure: SAVORY DILATION;  Surgeon: Midge Minium, MD;  Location: [facility] ENDOSCOPY;  Service: Endoscopy;  Laterality: N/A;   TRANSURETHRAL INCISION OF PROSTATE N/A 10/04/2021   Procedure: TRANSURETHRAL INCISION OF THE PROSTATE (TUIP);  Surgeon: Riki Altes, MD;  Location: [facility]  ORS;  Service: Urology;  Laterality: N/A;   HPI:  77 year old male admitted to [facility] after patient developed fever worsening right-sided weakness generalized malaise. Past medical history significant of CAD, COPD, type 2 diabetes, CVA, BPH presenting with CVA, scrotal cellulitis versus balanitis. 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   Assessment / Plan / Recommendation Clinical Impression       SLP Assessment  SLP Recommendation/Assessment: All further Speech Lanaguage Pathology  needs can be  addressed in the next venue of care SLP Visit Diagnosis: Aphasia (R47.01)    Recommendations for follow up therapy are one component of a multi-disciplinary discharge planning process, led by the attending physician.  Recommendations may be updated based on patient status, additional functional criteria and insurance authorization.    Follow Up Recommendations  Outpatient SLP    Assistance Recommended at Discharge  Frequent or constant Supervision/Assistance  Functional Status Assessment Patient has had a recent decline in their functional status and demonstrates the ability to make significant improvements in function in a reasonable and predictable amount of time.  Frequency and Duration           SLP Evaluation Cognition  Overall Cognitive Status: Difficult to assess (due to aphasia) Arousal/Alertness: Awake/alert       Comprehension  Auditory Comprehension Overall Auditory Comprehension: Appears within functional limits for tasks assessed (with extra time, pt follows 2-step commands and answers basic/complex yes/no questions) EffectiveTechniques: Extra processing time;Visual/Gestural cues    Expression Expression Primary Mode of Expression: Verbal Verbal Expression Overall Verbal Expression: Impaired Initiation: No impairment Automatic Speech: Name;Social Response (WFL) Level of Generative/Spontaneous Verbalization: Phrase;Sentence Repetition: No impairment Naming: Impairment Confrontation: Within functional limits (extra time; 10/10 objects) Divergent: Other (comment) (impaired; 4 animals in 60s) Verbal Errors: Perseveration;Semantic paraphasias Pragmatics: Impairment Impairments: Topic maintenance Other Verbal Expression Comments: intermittent difficulty answering biographical questions Written Expression Dominant Hand: Right Written Expression:  (DNT)   Oral / Motor  Oral Motor/Sensory Function Overall Oral Motor/Sensory Function: Mild impairment Facial ROM:  Reduced right (vs natural asymmetry) Motor Speech Overall Motor Speech: Impaired Respiration: Within functional limits Phonation: Normal Resonance: Within functional limits Articulation: Impaired Level of Impairment:  (minimal; across all levels) Intelligibility: Intelligibility reduced (minimally) Interfering Components: Premorbid status            Woodroe Chen 05/13/2023, 12:29 PM

## 2023-05-13 NOTE — TOC CM/SW Note (Signed)
Occupational Therapy * Physical Therapy * Speech Therapy  DATE 05/13/23 PATIENT NAME  Paul Bass PATIENT MRN  [id number]  DIAGNOSIS/DIAGNOSIS CODE  L03.90 DATE OF DISCHARGE    PRIMARY CARE PHYSICIAN  Lenon Oms NP PCP PHONE/FAX  [phone]   Dear Provider (Name: [facility]):   I certify that I have examined this patient and that occupational/physical/speech therapy is necessary on an outpatient basis.    The patient has expressed interest in completing their recommended course of therapy at your location.  Once a formal order from the patient's primary care physician has been obtained, please contact him/her to schedule an appointment for evaluation at your earliest convenience.  [ X ]  Physical Therapy Evaluate and Treat  [ X ]  Occupational Therapy Evaluate and Treat  [ X ]  Speech Therapy Evaluate and Treat  The patient's primary care physician (listed above) must furnish and be responsible for a formal order such that the recommended services may be furnished while under the primary physician's care, and that the plan of care will be established and reviewed every 30 days (or more often if condition necessitates).

## 2023-05-13 NOTE — Plan of Care (Signed)
  Problem: Education: Goal: Knowledge of disease or condition will improve 05/13/2023 0313 by Marrian Salvage, RN Outcome: Progressing 05/13/2023 0311 by Marrian Salvage, RN Outcome: Progressing Goal: Knowledge of secondary prevention will improve (MUST DOCUMENT ALL) Outcome: Progressing Goal: Knowledge of patient specific risk factors will improve Loraine Leriche N/A or DELETE if not current risk factor) Outcome: Progressing   Problem: Ischemic Stroke/TIA Tissue Perfusion: Goal: Complications of ischemic stroke/TIA will be minimized 05/13/2023 0313 by Marrian Salvage, RN Outcome: Progressing 05/13/2023 0311 by Marrian Salvage, RN Outcome: Progressing   Problem: Health Behavior/Discharge Planning: Goal: Ability to manage health-related needs will improve Outcome: Progressing Goal: Goals will be collaboratively established with patient/family Outcome: Progressing   Problem: Self-Care: Goal: Ability to participate in self-care as condition permits will improve 05/13/2023 0313 by Marrian Salvage, RN Outcome: Progressing 05/13/2023 0311 by Marrian Salvage, RN Outcome: Progressing Goal: Verbalization of feelings and concerns over difficulty with self-care will improve Outcome: Progressing Goal: Ability to communicate needs accurately will improve Outcome: Progressing

## 2023-05-13 NOTE — Plan of Care (Signed)
  Problem: Education: Goal: Knowledge of disease or condition will improve Outcome: Progressing Goal: Knowledge of secondary prevention will improve (MUST DOCUMENT ALL) Outcome: Progressing Goal: Knowledge of patient specific risk factors will improve Loraine Leriche N/A or DELETE if not current risk factor) Outcome: Progressing   Problem: Ischemic Stroke/TIA Tissue Perfusion: Goal: Complications of ischemic stroke/TIA will be minimized Outcome: Progressing   Problem: Coping: Goal: Will verbalize positive feelings about self Outcome: Progressing Goal: Will identify appropriate support needs Outcome: Progressing   Problem: Health Behavior/Discharge Planning: Goal: Ability to manage health-related needs will improve Outcome: Progressing   Problem: Self-Care: Goal: Ability to participate in self-care as condition permits will improve Outcome: Progressing   Problem: Education: Goal: Ability to describe self-care measures that may prevent or decrease complications (Diabetes Survival Skills Education) will improve Outcome: Progressing

## 2023-05-13 NOTE — Consult Note (Signed)
Reason for Consult:scrotal pain/ cellulitis Referring Physician: Dr. Trisha Mangle Paul Bass is an 77 y.o. male.  HPI: 77 yo M admitted to the hospital service with 1 week of scrotal pain diagnosed with scrotal cellulitis.  He reports today that over the past week or so, he has been having increasing scrotal pain swelling and redness.  Ultimately presented to the emergency room was diagnosed with scrotal cellulitis.  Has been started on broad-spectrum antibiotics in the form of ceftriaxone, Flagyl, vancomycin and also topical miconazole.  He did undergo scrotal ultrasound which showed chronic stable findings including bilateral hydroceles, simple on the right complex on the left, bilateral hydroceles and enlarged epididymitis with somewhat of a heterogeneous appearance along with epididymal cysts versus spermatoceles with calcification.  No evidence of decreased blood flow or epididymoorchitis.  In comparison to scrotal ultrasound from 04/2022, is fairly stable.    Morbidities include diabetes, CHF COPD as outlined below.  He is also been having neurological symptoms and being seen by neurology during this admission.  Urinalysis does show greater than 50 WBCs but otherwise unremarkable.  Urine culture is pending.  Past Medical History:  Diagnosis Date   Accelerated junctional rhythm    Anemia    Anxiety    a.) Tx'd with BZO PRN   Arthritis    Bilateral carotid artery disease ([name])    BPH (benign prostatic hypertrophy)    CAD S/P percutaneous coronary angioplasty    a.) PCI in 2002 placing a RCA stent (unknown type). b.) LHC 12/07/2015: 5% ISR m-dRCA, 90% dRCA, 25% pRCA, 25% p-mLCx, 100% OM3, 95% oOM2-OM2, 75% m-dLAD; refer to CVTS. c.) 3v CABG 01/03/2016   CHF (congestive heart failure) ([name])    a.) TTE 05/12/2012: EF 50-55%; mild LA enlargement; G1DD. b.)  TTE 11/26/2015: EF 45%; mild BAE; triv PR, mild MR/TR; inferolateral HK; G1DD. c.)  TTE 11/07/2017: EF 35%; RV enlargement; BAE;  inferior and inferolateral HK; triv PR; mild MR/TR. d.)  TTE 09/17/2019: EF 35%; mild LVH; triv MR/TR. e.)  TTE 08/11/2021: EF 50%; triv MR/TR; G1DD.   Cognitive deficit following cerebrovascular accident (CVA)    COPD (chronic obstructive pulmonary disease) ([name])    Cortical cataract    Depression    Dyspnea    GERD (gastroesophageal reflux disease)    History of 2019 novel coronavirus disease (COVID-19) 01/19/2021   History of acute inferior wall MI 2002   a.) PCI was performed placing a RCA stent (unknown type)   History of kidney stones    HTN (hypertension)    Hyperlipidemia    Hypothyroidism    Long term current use of antithrombotics/antiplatelets    a.) DAPT therapy (ASA + clopidogrel)   OSA (obstructive sleep apnea)    a.) does not require nocturnal PAP therapy   PLMD (periodic limb movement disorder)    Postoperative atrial fibrillation ([name]) 01/03/2016   a.) following CABG procedure   Prostatitis    PVD (peripheral vascular disease) ([name])    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 b/) residual RIGHT sided weakness   T2DM (type 2 diabetes mellitus) ([name])    Tobacco abuse     Past Surgical History:  Procedure Laterality Date   CARDIAC CATHETERIZATION N/A 12/07/2015   Procedure: Left  Heart Cath and Coronary Angiography;  Surgeon: Lamar Blinks, MD;  Location: [facility]IVE [facility];  Service: Cardiovascular;  Laterality: N/A;   CAROTID ANGIOGRAPHY N/A 06/05/2022   Procedure: CAROTID ANGIOGRAPHY;  Surgeon: Annice Needy, MD;  Location: [facility];  Service: Cardiovascular;  Laterality: N/A;   CAROTID PTA/STENT INTERVENTION Left    HX: 2 stents   CATARACT EXTRACTION W/PHACO Left 03/24/2021   Procedure: CATARACT EXTRACTION PHACO  AND INTRAOCULAR LENS PLACEMENT (IOC) LEFT DIABETIC 6.67 00:42.5;  Surgeon: Galen Manila, MD;  Location: [facility] ORS;  Service: Ophthalmology;  Laterality: Left;   COLONOSCOPY WITH PROPOFOL N/A 02/18/2019   Procedure: COLONOSCOPY WITH PROPOFOL;  Surgeon: Midge Minium, MD;  Location: [facility] ENDOSCOPY;  Service: Endoscopy;  Laterality: N/A;   COLONOSCOPY WITH PROPOFOL N/A 12/22/2021   Procedure: COLONOSCOPY WITH PROPOFOL;  Surgeon: Midge Minium, MD;  Location: [facility] ENDOSCOPY;  Service: Endoscopy;  Laterality: N/A;   CORONARY ANGIOPLASTY WITH STENT PLACEMENT Left 2002   CORONARY ARTERY BYPASS GRAFT N/A 01/03/2016   Procedure: 3v CORONARY ARTERY BYPASS GRAFT (LIMA-LAD, SVG-PDA, SVG-OM2); Location: [facility]; Surgeon: Cain Sieve, MD   CYSTOSCOPY W/ URETERAL STENT REMOVAL N/A 10/04/2021   Procedure: CYSTOSCOPY WITH PROSTATE FOREIGN BODY REMOVAL;  Surgeon: Riki Altes, MD;  Location: [facility] ORS;  Service: Urology;  Laterality: N/A;   CYSTOSCOPY WITH INSERTION OF UROLIFT N/A 04/29/2019   Procedure: CYSTOSCOPY WITH INSERTION OF UROLIFT;  Surgeon: Riki Altes, MD;  Location: [facility] ORS;  Service: Urology;  Laterality: N/A;   ESOPHAGOGASTRODUODENOSCOPY (EGD) WITH PROPOFOL N/A 09/11/2016   Procedure: ESOPHAGOGASTRODUODENOSCOPY (EGD) WITH PROPOFOL;  Surgeon: Midge Minium, MD;  Location: [facility] ENDOSCOPY;  Service: Endoscopy;  Laterality: N/A;   ESOPHAGOGASTRODUODENOSCOPY (EGD) WITH PROPOFOL N/A 10/17/2016   Procedure: ESOPHAGOGASTRODUODENOSCOPY (EGD) WITH PROPOFOL;  Surgeon: Midge Minium, MD;  Location: [facility] ENDOSCOPY;  Service: Endoscopy;  Laterality: N/A;   ESOPHAGOGASTRODUODENOSCOPY (EGD) WITH PROPOFOL N/A 11/13/2017   Procedure: ESOPHAGOGASTRODUODENOSCOPY (EGD) WITH PROPOFOL;  Surgeon: Midge Minium, MD;  Location: [facility] ENDOSCOPY;  Service: Endoscopy;  Laterality: N/A;   pins R lower leg Left    rotator cuff replaced Right    SAVORY DILATION N/A 10/17/2016   Procedure: SAVORY DILATION;  Surgeon: Midge Minium, MD;  Location: [facility] ENDOSCOPY;  Service: Endoscopy;  Laterality: N/A;   TRANSURETHRAL INCISION OF PROSTATE N/A 10/04/2021   Procedure: TRANSURETHRAL INCISION OF THE PROSTATE (TUIP);  Surgeon: Riki Altes, MD;  Location: [facility] OR[facility];  Service: Urology;  Laterality: N/A;    Family History  Problem Relation Age of Onset   Heart failure Father    Throat cancer Mother    Diabetes Brother     Social History:  reports that he quit smoking about 10 years ago. His smoking use included cigarettes. He has never used smokeless tobacco. He reports that he does not currently use alcohol. He reports that he does not use drugs.  Allergies: No Known Allergies  Medications:  Current Facility-Administered Medications:    aspirin EC tablet 81 mg, 81 mg, Oral, Q0600, Floydene Flock, MD, 81 mg at 05/12/23 2333   atorvastatin (LIPITOR) tablet 80 mg, 80 mg, Oral, Daily, Floydene Flock, MD, 80 mg at 05/13/23 8657   cefTRIAXone (ROCEPHIN) 2 g in sodium chloride 0.9 % 100 mL IVPB, 2 g, Intravenous, Q24H, Floydene Flock, MD, Stopped at 05/12/23 1752   clopidogrel (PLAVIX) tablet 75 mg, 75 mg, Oral, Daily, Floydene Flock, MD, 75 mg at 05/13/23 0928   enoxaparin (LOVENOX) injection 40 mg,  40 mg, Subcutaneous, Q24H, Floydene Flock, MD, 40 mg at 05/12/23 2335   finasteride (PROSCAR) tablet 5 mg, 5 mg, Oral, Daily, Floydene Flock, MD, 5 mg at 05/13/23 3244   gabapentin (NEURONTIN) capsule 300 mg, 300 mg, Oral, Daily, Floydene Flock, MD, 300 mg at 05/13/23 0827   insulin aspart (novoLOG) injection 0-9 Units, 0-9 Units, Subcutaneous, TID WC, Floydene Flock, MD, 1 Units at 05/13/23 0843   insulin aspart (novoLOG) injection 3 Units, 3 Units, Subcutaneous, TID WC, Floydene Flock, MD, 3 Units at 05/13/23 [phone]   levothyroxine (SYNTHROID) tablet 100 mcg, 100 mcg, Oral, Q0600, Floydene Flock, MD, 100 mcg at 05/13/23 7253   metoprolol tartrate (LOPRESSOR) tablet 25 mg, 25 mg, Oral, BID, Floydene Flock, MD, 25 mg at 05/12/23 2334   metroNIDAZOLE (FLAGYL) IVPB 500 mg, 500 mg, Intravenous, Q12H, Floydene Flock, MD, Last Rate: 100 mL/hr at 05/13/23 0606, 500 mg at 05/13/23 0606   miconazole (MICOTIN) 2 % cream, , Topical, BID, Floydene Flock, MD, Given at 05/13/23 0931   pantoprazole (PROTONIX) EC tablet 40 mg, 40 mg, Oral, Daily, Floydene Flock, MD, 40 mg at 05/13/23 6644   tamsulosin (FLOMAX) capsule 0.4 mg, 0.4 mg, Oral, Daily, Floydene Flock, MD, 0.4 mg at 05/13/23 0347   vancomycin (VANCOREADY) IVPB 2000 mg/400 mL, 2,000 mg, Intravenous, Q24H, Hallaji, Sheema M, RPH, Last Rate: 200 mL/hr at 05/13/23 0939, 2,000 mg at 05/13/23 0939   venlafaxine XR (EFFEXOR-XR) 24 hr capsule 150 mg, 150 mg, Oral, Daily, Floydene Flock, MD, 150 mg at 05/12/23 2333   Results for orders placed or performed during the hospital encounter of 05/12/23 (from the past 48 hour(s))  CBC     Status: Abnormal   Collection Time: 05/12/23 12:52 AM  Result Value Ref Range   WBC 14.9 (H) 4.0 - 10.5 K/uL   RBC 4.28 4.22 - 5.81 MIL/uL   Hemoglobin 13.9 13.0 - 17.0 g/dL   HCT 42.5 95.6 - 38.7 %   MCV 93.2 80.0 - 100.0 fL   MCH 32.5 26.0 - 34.0 pg   MCHC 34.8 30.0 - 36.0 g/dL   RDW 56.4 33.2 - 95.1 %   Platelets 160 150 - 400 K/uL   nRBC 0.0 0.0 - 0.2 %    Comment: Performed at [facility], [address] [place], [place] [address]  Comprehensive metabolic panel     Status: Abnormal   Collection Time: 05/12/23 12:52 AM  Result Value Ref Range   Sodium 135 135 - 145 mmol/L   Potassium 3.8 3.5 - 5.1 mmol/L   Chloride 102 98 - 111 mmol/L   CO2 24 22 - 32 mmol/L   Glucose, Bld 147 (H) 70 - 99 mg/dL    Comment: Glucose reference range applies only to samples taken after fasting for at least 8 hours.   BUN 18 8 - 23 mg/dL   Creatinine, Ser 6.06 0.61 - 1.24 mg/dL   Calcium 8.6 (L) 8.9 - 10.3 mg/dL   Total Protein 7.2 6.5 - 8.1 g/dL   Albumin 3.7 3.5 - 5.0 g/dL   AST 16 15 - 41 U/L   ALT 16 0 - 44  U/L   Alkaline Phosphatase 83 38 - 126 U/L   Total Bilirubin 0.9 0.3 - 1.2 mg/dL   GFR, Estimated >30 >16 mL/min    Comment: (NOTE) Calculated using the CKD-EPI Creatinine Equation (2021)    Anion gap 9 5 -  15    Comment: Performed at [facility], [address] [place], [place] [address]  Troponin I (High Sensitivity)     Status: Abnormal   Collection Time: 05/12/23 12:52 AM  Result Value Ref Range   Troponin I (High Sensitivity) 19 (H) <18 ng/L    Comment: (NOTE) Elevated high sensitivity troponin I (hsTnI) values and significant  changes across serial measurements may suggest ACS but many other  chronic and acute conditions are known to elevate hsTnI results.  Refer to the "Links" section for chest pain algorithms and additional  guidance. Performed at [facility], [address] [place], [place] [address]   Hemoglobin A1c     Status: Abnormal   Collection Time: 05/12/23 12:52 AM  Result Value Ref Range   Hgb A1c MFr Bld 6.1 (H) 4.8 - 5.6 %    Comment: (NOTE) Pre diabetes:          5.7%-6.4%  Diabetes:              >6.4%  Glycemic control for   <7.0% adults with diabetes    Mean Plasma Glucose 128.37 mg/dL    Comment: Performed at [facility], [address], [place], [place] [address]  Resp panel by RT-PCR (RSV, Flu A&B, Covid) Anterior Nasal Swab     Status: None   Collection Time: 05/12/23  1:05 AM   Specimen: Anterior Nasal Swab  Result Value Ref Range   SARS Coronavirus 2 by RT PCR NEGATIVE NEGATIVE    Comment: (NOTE) SARS-CoV-2 target nucleic acids are NOT DETECTED.  The SARS-CoV-2 RNA is generally detectable in upper respiratory specimens during the acute phase of infection. The lowest concentration of SARS-CoV-2 viral copies this assay can detect is 138 copies/mL. A negative result does not preclude SARS-Cov-2 infection and should not be used as the sole basis for treatment or other patient management decisions. A negative  result may occur with  improper specimen collection/handling, submission of specimen other than nasopharyngeal swab, presence of viral mutation(s) within the areas targeted by this assay, and inadequate number of viral copies(<138 copies/mL). A negative result must be combined with clinical observations, patient history, and epidemiological information. The expected result is Negative.  Fact Sheet for Patients:  BloggerCourse.com  Fact Sheet for Healthcare Providers:  SeriousBroker.it  This test is no t yet approved or cleared by the [place] FDA and  has been authorized for detection and/or diagnosis of SARS-CoV-2 by FDA under an Emergency Use Authorization (EUA). This EUA will remain  in effect (meaning this test can be used) for the duration of the COVID-19 declaration under Section 564(b)(1) of the Act, 21 U.S.C.section 360bbb-3(b)(1), unless the authorization is terminated  or revoked sooner.       Influenza A by PCR NEGATIVE NEGATIVE   Influenza B by PCR NEGATIVE NEGATIVE    Comment: (NOTE) The Xpert Xpress SARS-CoV-2/FLU/RSV plus assay is intended as an aid in the diagnosis of influenza from Nasopharyngeal swab specimens and should not be used as a sole basis for treatment. Nasal washings and aspirates are unacceptable for Xpert Xpress SARS-CoV-2/FLU/RSV testing.  Fact Sheet for Patients: BloggerCourse.com  Fact Sheet for Healthcare Providers: SeriousBroker.it  This test is not yet approved or cleared by the [place] FDA and has been authorized for detection and/or diagnosis of SARS-CoV-2 by FDA under an Emergency Use Authorization (EUA). This EUA will remain in effect (meaning this test can be used) for the duration of the COVID-19  declaration under Section 564(b)(1) of the Act, 21 U.S.C. section 360bbb-3(b)(1), unless the authorization is terminated  or revoked.     Resp Syncytial Virus by PCR NEGATIVE NEGATIVE    Comment: (NOTE) Fact Sheet for Patients: BloggerCourse.com  Fact Sheet for Healthcare Providers: SeriousBroker.it  This test is not yet approved or cleared by the [place] FDA and has been authorized for detection and/or diagnosis of SARS-CoV-2 by FDA under an Emergency Use Authorization (EUA). This EUA will remain in effect (meaning this test can be used) for the duration of the COVID-19 declaration under Section 564(b)(1) of the Act, 21 U.S.C. section 360bbb-3(b)(1), unless the authorization is terminated or revoked.  Performed at [facility], [address] [place], [place] [address]   Troponin I (High Sensitivity)     Status: Abnormal   Collection Time: 05/12/23  4:30 AM  Result Value Ref Range   Troponin I (High Sensitivity) 28 (H) <18 ng/L    Comment: (NOTE) Elevated high sensitivity troponin I (hsTnI) values and significant  changes across serial measurements may suggest ACS but many other  chronic and acute conditions are known to elevate hsTnI results.  Refer to the "Links" section for chest pain algorithms and additional  guidance. Performed at [facility], [address] [place], [place] [address]   Lactic acid, plasma     Status: None   Collection Time: 05/12/23  4:30 AM  Result Value Ref Range   Lactic Acid, Venous 1.5 0.5 - 1.9 mmol/L    Comment: Performed at [facility], [address] [place], [place] [address]  Protime-INR     Status: None   Collection Time: 05/12/23  4:30 AM  Result Value Ref Range   Prothrombin Time 15.1 11.4 - 15.2 seconds   INR 1.2 0.8 - 1.2    Comment: (NOTE) INR goal varies based on device and disease states. Performed at [facility], [address] [place], [place] [address]   APTT     Status: None   Collection Time: 05/12/23  4:30 AM  Result Value Ref Range    aPTT 24 24 - 36 seconds    Comment: Performed at [facility], [address] [place], [place] [address]  Blood Culture (routine x 2)     Status: None (Preliminary result)   Collection Time: 05/12/23  4:30 AM   Specimen: BLOOD  Result Value Ref Range   Specimen Description BLOOD BLOOD RIGHT ARM    Special Requests      BOTTLES DRAWN AEROBIC AND ANAEROBIC Blood Culture results may not be optimal due to an excessive volume of blood received in culture bottles   Culture      NO GROWTH < 24 HOURS Performed at [facility], [address], [place], [place] [address]    Report Status PENDING   Blood Culture (routine x 2)     Status: None (Preliminary result)   Collection Time: 05/12/23  4:31 AM   Specimen: BLOOD  Result Value Ref Range   Specimen Description BLOOD BLOOD LEFT ARM    Special Requests      BOTTLES DRAWN AEROBIC AND ANAEROBIC Blood Culture results may not be optimal due to an excessive volume of blood received in culture bottles   Culture      NO GROWTH < 24 HOURS Performed at [facility], [address], [place], [place] [address]    Report Status PENDING   Urinalysis, Routine w reflex microscopic -Urine,  Clean Catch     Status: Abnormal   Collection Time: 05/12/23  7:30 AM  Result Value Ref Range   Color, Urine YELLOW (A) YELLOW   APPearance HAZY (A) CLEAR   Specific Gravity, Urine 1.011 1.005 - 1.030   pH 6.0 5.0 - 8.0   Glucose, UA NEGATIVE NEGATIVE mg/dL   Hgb urine dipstick NEGATIVE NEGATIVE   Bilirubin Urine NEGATIVE NEGATIVE   Ketones, ur NEGATIVE NEGATIVE mg/dL   Protein, ur NEGATIVE NEGATIVE mg/dL   Nitrite NEGATIVE NEGATIVE   Leukocytes,Ua MODERATE (A) NEGATIVE   RBC / HPF 0 0 - 5 RBC/hpf   WBC, UA >50 0 - 5 WBC/hpf   Bacteria, UA FEW (A) NONE SEEN   Squamous Epithelial / HPF 0-5 0 - 5 /HPF   WBC Clumps PRESENT    Mucus PRESENT    Hyaline Casts, UA PRESENT    Granular Casts, UA PRESENT     Comment: Performed at  [facility], [address] [place], [place] [address]  CBG monitoring, ED     Status: Abnormal   Collection Time: 05/12/23 11:13 AM  Result Value Ref Range   Glucose-Capillary 106 (H) 70 - 99 mg/dL    Comment: Glucose reference range applies only to samples taken after fasting for at least 8 hours.  CBG monitoring, ED     Status: Abnormal   Collection Time: 05/12/23  4:45 PM  Result Value Ref Range   Glucose-Capillary 178 (H) 70 - 99 mg/dL    Comment: Glucose reference range applies only to samples taken after fasting for at least 8 hours.  Glucose, capillary     Status: Abnormal   Collection Time: 05/12/23  8:14 PM  Result Value Ref Range   Glucose-Capillary 160 (H) 70 - 99 mg/dL    Comment: Glucose reference range applies only to samples taken after fasting for at least 8 hours.  Lipid panel     Status: None   Collection Time: 05/13/23  5:41 AM  Result Value Ref Range   Cholesterol 110 0 - 200 mg/dL   Triglycerides 46 <202 mg/dL   HDL 42 >54 mg/dL   Total CHOL/HDL Ratio 2.6 RATIO   VLDL 9 0 - 40 mg/dL   LDL Cholesterol 59 0 - 99 mg/dL    Comment:        Total Cholesterol/HDL:CHD Risk Coronary Heart Disease Risk Table                     Men   Women  1/2 Average Risk   3.4   3.3  Average Risk       5.0   4.4  2 X Average Risk   9.6   7.1  3 X Average Risk  23.4   11.0        Use the calculated Patient Ratio above and the CHD Risk Table to determine the patient's CHD Risk.        ATP III CLASSIFICATION (LDL):  <100     mg/dL   Optimal  270-623  mg/dL   Near or Above                    Optimal  130-159  mg/dL   Borderline  762-831  mg/dL   High  >517     mg/dL   Very High Performed at [facility], [address] [place], [place] [address]   Glucose, capillary     Status: Abnormal  Collection Time: 05/13/23  7:39 AM  Result Value Ref Range   Glucose-Capillary 134 (H) 70 - 99 mg/dL    Comment: Glucose reference range applies only to samples  taken after fasting for at least 8 hours.  Glucose, capillary     Status: Abnormal   Collection Time: 05/13/23 11:55 AM  Result Value Ref Range   Glucose-Capillary 113 (H) 70 - 99 mg/dL    Comment: Glucose reference range applies only to samples taken after fasting for at least 8 hours.    ECHOCARDIOGRAM COMPLETE  Result Date: 05/12/2023    ECHOCARDIOGRAM REPORT   Patient Name:   Pauline TIMOTY SPAUR Date of Exam: 05/12/2023 Medical Rec #:  [id number]      Height:       71.0 in Accession #:    [id number]     Weight:       212.0 lb Date of Birth:  [birth year]      BSA:          2.161 m Patient Age:    77 years       BP:           139/61 mmHg Patient Gender: M              HR:           55 bpm. Exam Location:  [facility] Procedure: 2D Echo and Intracardiac Opacification Agent Indications:     TIA G45.9  History:         Patient has prior history of Echocardiogram examinations, most                  recent 05/13/2012.  Sonographer:     Overton Mam RDCS, FASE Referring Phys:  [id number] Francoise Schaumann [place]ON Diagnosing Phys: Lennie Odor MD  Sonographer Comments: Technically difficult study due to poor echo windows, suboptimal apical window and no subcostal window. Image acquisition challenging due to respiratory motion. IMPRESSIONS  1. Left ventricular ejection fraction, by estimation, is 35 to 40%. The left ventricle has moderately decreased function. The left ventricle demonstrates regional wall motion abnormalities (see scoring diagram/findings for description). The left ventricular internal cavity size was mildly dilated. Left ventricular diastolic parameters are consistent with Grade I diastolic dysfunction (impaired relaxation).  2. Right ventricular systolic function is normal. The right ventricular size is normal. Tricuspid regurgitation signal is inadequate for assessing PA pressure.  3. The mitral valve is grossly normal. Trivial mitral valve regurgitation. No evidence of mitral stenosis.  4. The aortic valve is  tricuspid. There is mild calcification of the aortic valve. Aortic valve regurgitation is not visualized. Aortic valve sclerosis is present, with no evidence of aortic valve stenosis. FINDINGS  Left Ventricle: Left ventricular ejection fraction, by estimation, is 35 to 40%. The left ventricle has moderately decreased function. The left ventricle demonstrates regional wall motion abnormalities. Definity contrast agent was given IV to delineate the left ventricular endocardial borders. The left ventricular internal cavity size was mildly dilated. There is no left ventricular hypertrophy. Abnormal (paradoxical) septal motion consistent with post-operative status. Left ventricular diastolic parameters are consistent with Grade I diastolic dysfunction (impaired relaxation).  LV Wall Scoring: The posterior wall and basal inferior segment are hypokinetic. Right Ventricle: The right ventricular size is normal. No increase in right ventricular wall thickness. Right ventricular systolic function is normal. Tricuspid regurgitation signal is inadequate for assessing PA pressure. Left Atrium: Left atrial size was normal in size. Right Atrium: Right atrial size was normal in  size. Pericardium: There is no evidence of pericardial effusion. Mitral Valve: The mitral valve is grossly normal. Trivial mitral valve regurgitation. No evidence of mitral valve stenosis. Tricuspid Valve: The tricuspid valve is grossly normal. Tricuspid valve regurgitation is not demonstrated. No evidence of tricuspid stenosis. Aortic Valve: The aortic valve is tricuspid. There is mild calcification of the aortic valve. Aortic valve regurgitation is not visualized. Aortic valve sclerosis is present, with no evidence of aortic valve stenosis. Aortic valve peak gradient measures 13.8 mmHg. Pulmonic Valve: The pulmonic valve was not well visualized. Pulmonic valve regurgitation is not visualized. Aorta: The aortic root and ascending aorta are structurally  normal, with no evidence of dilitation. Venous: The inferior vena cava was not well visualized. IAS/Shunts: The atrial septum is grossly normal.  LEFT VENTRICLE PLAX 2D LVIDd:         6.20 cm     Diastology LVIDs:         5.10 cm     LV e' medial:    6.09 cm/s LV PW:         1.10 cm     LV E/e' medial:  16.9 LV IVS:        0.90 cm     LV e' lateral:   6.74 cm/s LVOT diam:     2.10 cm     LV E/e' lateral: 15.3 LV SV:         94 LV SV Index:   44 LVOT Area:     3.46 cm  LV Volumes (MOD) LV vol d, MOD A4C: 97.4 ml LV vol s, MOD A4C: 46.8 ml LV SV MOD A4C:     97.4 ml RIGHT VENTRICLE TAPSE (M-mode): 1.5 cm LEFT ATRIUM             Index        RIGHT ATRIUM           Index LA diam:        4.40 cm 2.04 cm/m   RA Area:     14.00 cm LA Vol (A2C):   28.5 ml 13.19 ml/m  RA Volume:   34.10 ml  15.78 ml/m LA Vol (A4C):   45.1 ml 20.87 ml/m LA Biplane Vol: 36.1 ml 16.70 ml/m  AORTIC VALVE AV Area (Vmax): 2.20 cm AV Vmax:        186.00 cm/s AV Peak Grad:   13.8 mmHg LVOT Vmax:      118.00 cm/s LVOT Vmean:     78.200 cm/s LVOT VTI:       0.272 m  AORTA Ao Root diam: 3.30 cm Ao Asc diam:  3.20 cm MITRAL VALVE MV Area (PHT): 1.49 cm     SHUNTS MV Decel Time: 510 msec     Systemic VTI:  0.27 m MV E velocity: 103.00 cm/s  Systemic Diam: 2.10 cm MV A velocity: 105.00 cm/s MV E/A ratio:  0.98 Lennie Odor MD Electronically signed by Lennie Odor MD Signature Date/Time: 05/12/2023/3:47:52 PM    Final    MR BRAIN WO CONTRAST  Result Date: 05/12/2023 CLINICAL DATA:  77 year old male with paroxysmal atrial fibrillation. Weakness. Pain. Falls. TIA. 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 EXAM: MRI HEAD WITHOUT CONTRAST TECHNIQUE: Multiplanar, multiecho pulse sequences of the brain and surrounding structures were obtained without intravenous contrast. COMPARISON:  CTA head and neck this morning.  Brain MRI 10/03/2019. FINDINGS: Brain: Punctate abnormal diffusion in the right superior cerebellar artery territory  series 5, image 65. This is  not well correlated on ADC, or on coronal DWI. No other restricted diffusion. No midline shift, mass effect, evidence of mass lesion, or acute intracranial hemorrhage. Cervicomedullary junction and pituitary are within normal limits. Chronic left MCA territory encephalomalacia. Ex vacuo left ventricular enlargement. Mild hemosiderin. Gliosis tracking into the left deep white matter and deep gray matter. Mild mid brain Wallerian degeneration. Small chronic right cerebellar infarcts, right AICA or PICA territory. Other scattered white matter T2 and FLAIR hyperintensity in both hemispheres does not appear significantly changed since 15-Jul-2020. No other cortical encephalomalacia or chronic cerebral blood products. Right deep gray nuclei remain normal. Vascular: Major intracranial vascular flow voids are stable compared to 2020-07-15. Skull and upper cervical spine: Negative visible cervical spine. Visualized bone marrow signal is within normal limits. Sinuses/Orbits: Postoperative changes to the left globe. Otherwise stable and negative. Other: Visible internal auditory structures appear normal. Mastoids are well aerated. Negative visible scalp and face. IMPRESSION: 1. Evidence of a punctate acute or subacute infarct in the Right superior cerebellum. No associated hemorrhage or mass effect. 2. Otherwise stable MRI appearance of advanced chronic ischemic disease since 07/15/2020, most pronounced in the left MCA and right cerebellar artery territories. Electronically Signed   By: Odessa Fleming M.D.   On: 05/12/2023 10:46   CT ANGIO HEAD NECK W WO CM  Result Date: 05/12/2023 CLINICAL DATA:  77 year old male with paroxysmal atrial fibrillation. Weakness. Pain. Falls. TIA. EXAM: CT ANGIOGRAPHY HEAD AND NECK WITH AND WITHOUT CONTRAST TECHNIQUE: Multidetector CT imaging of the head and neck was performed using the standard protocol during bolus administration of intravenous contrast. Multiplanar CT image  reconstructions and MIPs were obtained to evaluate the vascular anatomy. Carotid stenosis measurements (when applicable) are obtained utilizing NASCET criteria, using the distal internal carotid diameter as the denominator. RADIATION DOSE REDUCTION: This exam was performed according to the departmental dose-optimization program which includes automated exposure control, adjustment of the mA and/or kV according to patient size and/or use of iterative reconstruction technique. CONTRAST:  75mL OMNIPAQUE IOHEXOL 350 MG/ML SOLN COMPARISON:  Head CT 0111 hours today. Previous CTA Neck 04/27/2022. FINDINGS: CTA NECK Skeleton: Previous sternotomy absent maxillary dentition. Mild for age cervical spine degeneration. No acute osseous abnormality identified. Mild chronic C7, T3, T4 compression fractures appears stable. Upper chest: Previous CABG. Some upper lobe paraseptal and centrilobular emphysema. Other neck: No acute finding. Aortic arch: Calcified aortic atherosclerosis.  Three vessel arch. Right carotid system: Brachiocephalic artery plaque without stenosis. Suspected streak artifact today at the proximal right CCA on series 7, image 150, no definite genuine stenosis there. Right CCA plaque with progressed and 60-70 % stenosis with respect to the distal vessel at the C4 level, located about 15 mm before the right carotid bifurcation. Superimposed bulky calcified plaque at the right ICA origin and bulb resulting in up to 60-65 % stenosis with respect to the distal vessel not significantly changed since last year (series 4, image 109). Right ICA remains patent to the skull base. Left carotid system: Left CCA origin plaque without stenosis. Circumferential soft and calcified plaque in the left CCA at the level of the larynx has mildly progressed from last year with up to 50 % stenosis with respect to the distal vessel on series 4, image 122. Chronic left carotid stent in place and remains patent through the left carotid  bifurcation although there is evidence of some in stent stenosis best demonstrated with bone windows on series 4, image 106. This does not appear significantly changed.  Tortuous left ICA below the skull base. Vertebral arteries: Proximal right subclavian artery atherosclerosis without stenosis. Right vertebral artery origin is patent without significant stenosis. Right vertebral is patent to the skull base with no significant plaque or stenosis. Proximal left subclavian artery atherosclerosis with up to 50% stenosis is stable. Calcified plaque at the left vertebral artery origin results in moderate to severe stenosis on series 7, image 182, stable. Left vertebral is codominant, remains patent with only mild additional left distal V2 and V3 segment calcified plaque to the skull base. CTA HEAD Posterior circulation: Distal vertebral arteries and vertebrobasilar junction are patent without stenosis. Normal PICA origins. Left V4 segment is dominant. Patent basilar artery without stenosis. Patent SCA and PCA origins. Small posterior communicating arteries. Bilateral PCA branches are within normal limits. Anterior circulation: Left ICA siphon is patent with moderate calcified plaque. Subsequent mild to moderate left siphon stenosis. Right ICA siphon is patent with similar mild to moderate calcified plaque, only mild right siphon stenosis. Normal ophthalmic and posterior communicating artery origins. Patent carotid termini. Normal MCA and ACA origins. Normal anterior communicating artery. Bilateral ACA branches are within normal limits. Left MCA M1 segment and trifurcation are patent without stenosis. Right MCA M1 segment and trifurcation are patent without stenosis. Bilateral MCA branches are within normal limits. Venous sinuses: Early contrast timing, not well evaluated. Anatomic variants: Dominant left vertebral artery. Review of the MIP images confirms the above findings IMPRESSION: 1. Negative for large vessel  occlusion. 2. Positive for chronically advanced cervical carotid atherosclerosis. Some progression since Neck CTA last year: - distal Right CCA 60-70% stenosis due to bulky calcified plaque. - proximal Left CCA 50% stenosis due to soft and calcified plaque. 3. Positive also for chronic and stable hemodynamically significant cervical carotid stenosis: - Right ICA origin and bulb 60-65%. - Left ICA in-Stent stenosis series 7, image 182. 4. Stable moderate to severe atherosclerotic stenosis of the dominant Left Vertebral Artery no other significant posterior circulation stenosis. 5. No hemodynamically significant intracranial anterior circulation stenosis. 6. Aortic Atherosclerosis (ICD10-I70.0) and Emphysema (ICD10-J43.9). Electronically Signed   By: Odessa Fleming M.D.   On: 05/12/2023 10:26   US SCROTUM W/DOPPLER  Result Date: 05/12/2023 CLINICAL DATA:  77 year old male presenting with testicular pain for 1 week. EXAM: SCROTAL ULTRASOUND DOPPLER ULTRASOUND OF THE TESTICLES TECHNIQUE: Complete ultrasound examination of the testicles, epididymis, and other scrotal structures was performed. Color and spectral Doppler ultrasound were also utilized to evaluate blood flow to the testicles. COMPARISON:  No priors. FINDINGS: Right testicle Measurements: 3.4 x 2.7 x 2.7 cm. No mass or microlithiasis visualized. Left testicle Measurements: 3.6 x 3.0 x 3.1 cm. No mass or microlithiasis visualized. Right epididymis: Enlarged measuring 2.4 x 2.9 x 3.0 cm with multiple small anechoic lesions with increased through transmission, compatible with cysts or spermatoceles, largest of which measures 7 x 6 x 6 mm. Echogenic foci with posterior acoustic shadowing indicative of calcifications incidentally noted. Left epididymis: Enlarged measuring 1.4 x 2.2 x 2.9 cm with multiple anechoic lesions with increased through transmission compatible with cysts or spermatoceles, largest of which measures 9 x 12 x 8 mm. Echogenic foci with posterior  acoustic shadowing indicative of calcifications incidentally noted. Hydrocele: Bilateral hydroceles, simple on the right and septated on the left. Varicocele:  Bilateral varicoceles. Pulsed Doppler interrogation of both testes demonstrates normal low resistance arterial and venous waveforms bilaterally. IMPRESSION: 1. Bilateral hydroceles, simple on the right and complex (multiseptated) left. 2. Bilateral varicoceles. 3. Epididymides are enlarged  and heterogeneous in appearance bilaterally, with multiple epididymal cysts and/or spermatoceles, and multiple calcifications. 4. No evidence of testicular torsion, or findings suggestive of epididymo-orchitis. Electronically Signed   By: Trudie Reed M.D.   On: 05/12/2023 07:26   DG Chest Port 1 View  Result Date: 05/12/2023 CLINICAL DATA:  77 year old male with progressive weakness. Possible sepsis. EXAM: PORTABLE CHEST 1 VIEW COMPARISON:  Chest x-ray 09/11/2016. FINDINGS: Lung volumes are normal. No consolidative airspace disease. No pleural effusions. No pneumothorax. No pulmonary nodule or mass noted. Moderate-sized hiatal hernia. Pulmonary vasculature and the cardiomediastinal silhouette are otherwise within normal limits. Atherosclerosis in the thoracic aorta. Status post median sternotomy for CABG. IMPRESSION: 1. No radiographic evidence of acute cardiopulmonary disease. 2. Aortic atherosclerosis. 3. Hiatal hernia again noted. Electronically Signed   By: Trudie Reed M.D.   On: 05/12/2023 06:27   CT HEAD WO CONTRAST ( )  Result Date: 05/12/2023 CLINICAL DATA:  Multiple falls. Neuro deficit, acute, stroke suspected. EXAM: CT HEAD WITHOUT CONTRAST TECHNIQUE: Contiguous axial images were obtained from the base of the skull through the vertex without intravenous contrast. RADIATION DOSE REDUCTION: This exam was performed according to the departmental dose-optimization program which includes automated exposure control, adjustment of the mA and/or kV  according to patient size and/or use of iterative reconstruction technique. 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 There is atrophy and chronic small vessel disease changes. No acute intracranial abnormality. Specifically, no hemorrhage, hydrocephalus, mass lesion, acute infarction, or significant intracranial injury. Vascular: No hyperdense vessel or unexpected calcification. Skull: No acute calvarial abnormality. 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 Atrophy, chronic microvascular disease. No acute intracranial abnormality. Electronically Signed   By: Charlett Nose M.D.   On: 05/12/2023 03:22    Review of Systems  All other systems reviewed and are negative.  Blood pressure 126/67, pulse 81, temperature 97.8 F (36.6 C), resp. rate 16, height 6\' 1"  (1.854 m), weight 101.4 kg, SpO2 93%. Physical Exam Constitutional:      Appearance: Normal appearance.     Comments: Family member at bedside  HENT:     Head: Normocephalic and atraumatic.  Pulmonary:     Effort: Pulmonary effort is normal.  Abdominal:     General: Abdomen is flat.     Palpations: Abdomen is soft.  Genitourinary:    Comments: Scrotum with significant erythema, left greater than right with some overlying skin thickening on the side.  Left-sided testicle nonpalpable secondary to small orange size hydrocele.  Right testicle is palpable, fairly unremarkable.  No fluctuance.  No palpable or draining abscess appreciated.  Perineum intact.  Normal phallus. Neurological:     Mental Status: He is alert and oriented to person, place, and time.     Assessment/Plan:  1.  Scrotal cellulitis-agree with broad-spectrum antibiotics, de-escalate with improvement.  Recommend scrotal support, scrotum placed on folded up towel to create a cushion while lying in bed.  Tight underwear when  ambulating.  2.  Bilateral hydrocele/epididymal abnormalities-do not suspect any underlying malignant process.  Compared to previous ultrasound a year ago, these findings are overall fairly stable with chronic likely inflammatory in nature.  Left-sided hydrocele is slightly more complex but no additional intervention then as outlined above.  3.  Possible UTI-urinalysis is suspicious.  Urine culture pending.  Antibiotics as above.  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 Dow Adolph at [facility] at this point in time.  He does have an appointment on Wednesday specifically to discuss this.  Do not feel like his scrotal pathology is related to these particular issues but if he has a urinary tract infection, that may be related to the aforementioned issue.  Advised to follow-up with primary urologist as outpatient to discuss further.   Vanna Scotland 05/13/2023, 12:19 PM

## 2023-05-13 NOTE — Consult Note (Signed)
NEUROLOGY CONSULT NOTE   Date of service: May 13, 2023 Patient Name: Paul Bass MRN:  [id number] DOB:  1946/03/12 Chief Complaint: "Right sided weakness, difficulty with speech" Requesting Provider: Leeroy Bock, MD  History of Present Illness  Paul Bass is a 77 y.o. male  has a past medical history of Accelerated junctional rhythm, Anemia, Anxiety, Arthritis, Bilateral carotid artery disease ([name]), BPH (benign prostatic hypertrophy), CAD S/P percutaneous coronary angioplasty, CHF (congestive heart failure) ([name]), Cognitive deficit following cerebrovascular accident (CVA), COPD (chronic obstructive pulmonary disease) ([name]), Cortical cataract, Depression, Dyspnea, GERD (gastroesophageal reflux disease), History of 2019 novel coronavirus disease (COVID-19) (01/19/2021), History of acute inferior wall MI (2002), History of kidney stones, HTN (hypertension), Hyperlipidemia, Hypothyroidism, Long term current use of antithrombotics/antiplatelets, OSA (obstructive sleep apnea), PLMD (periodic limb movement disorder), Postoperative atrial fibrillation ([name]) (01/03/2016), Prostatitis, PVD (peripheral vascular disease) ([name]), 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 who presents with fever, chills, worsening right sided weakness and speech.  Per wife patient had his flu shot on Thursday.  Afterward patient began to have fever and chills.  Previous residual stroke symptoms worsened.  Patient was brought in for evaluation.    LKW: 05/10/23, unknown time Modified rankin score: 1-No significant post stroke disability and can perform usual duties with stroke symptoms IV Thrombolysis: No Outside time window EVT: No Outside time window   ROS  Comprehensive ROS performed and pertinent positives documented in HPI   Past History   Past Medical History:  Diagnosis Date    Accelerated junctional rhythm    Anemia    Anxiety    a.) Tx'd with BZO PRN   Arthritis    Bilateral carotid artery disease ([name])    BPH (benign prostatic hypertrophy)    CAD S/P percutaneous coronary angioplasty    a.) PCI in 2002 placing a RCA stent (unknown type). b.) LHC 12/07/2015: 5% ISR m-dRCA, 90% dRCA, 25% pRCA, 25% p-mLCx, 100% OM3, 95% oOM2-OM2, 75% m-dLAD; refer to CVTS. c.) 3v CABG 01/03/2016   CHF (congestive heart failure) ([name])    a.) TTE 05/12/2012: EF 50-55%; mild LA enlargement; G1DD. b.)  TTE 11/26/2015: EF 45%; mild BAE; triv PR, mild MR/TR; inferolateral HK; G1DD. c.)  TTE 11/07/2017: EF 35%; RV enlargement; BAE; inferior and inferolateral HK; triv PR; mild MR/TR. d.)  TTE 09/17/2019: EF 35%; mild LVH; triv MR/TR. e.)  TTE 08/11/2021: EF 50%; triv MR/TR; G1DD.   Cognitive deficit following cerebrovascular accident (CVA)    COPD (chronic obstructive pulmonary disease) ([name])    Cortical cataract    Depression    Dyspnea    GERD (gastroesophageal reflux disease)    History of 2019 novel coronavirus disease (COVID-19) 01/19/2021   History of acute inferior wall MI 2002   a.) PCI was performed placing a RCA stent (unknown type)   History of kidney stones    HTN (hypertension)    Hyperlipidemia    Hypothyroidism    Long term current use of antithrombotics/antiplatelets    a.) DAPT therapy (ASA + clopidogrel)   OSA (obstructive sleep apnea)    a.) does not require nocturnal PAP therapy   PLMD (periodic limb movement disorder)    Postoperative atrial fibrillation ([name]) 01/03/2016   a.) following CABG procedure   Prostatitis    PVD (peripheral vascular disease) ([name])    S/P CABG x 3 01/03/2016   a.) LIMA-LAD, SVG-PDA,  SVG-OM2   Stroke ([name]) 04/2012   a.) LEFT M1 occlusion from possible moderate LEFT ICA stenosis; Tx with TPA + mechanical embolectomy with Trevo Provue retrieval device with full recanalization and proximal LEFT ICA rescue stent. b/) residual RIGHT sided  weakness   T2DM (type 2 diabetes mellitus) ([name])    Tobacco abuse     Past Surgical History:  Procedure Laterality Date   CARDIAC CATHETERIZATION N/A 12/07/2015   Procedure: Left Heart Cath and Coronary Angiography;  Surgeon: Lamar Blinks, MD;  Location: [facility];  Service: Cardiovascular;  Laterality: N/A;   CAROTID ANGIOGRAPHY N/A 06/05/2022   Procedure: CAROTID ANGIOGRAPHY;  Surgeon: Annice Needy, MD;  Location: [facility];  Service: Cardiovascular;  Laterality: N/A;   CAROTID PTA/STENT INTERVENTION Left    HX: 2 stents   CATARACT EXTRACTION W/PHACO Left 03/24/2021   Procedure: CATARACT EXTRACTION PHACO AND INTRAOCULAR LENS PLACEMENT (IOC) LEFT DIABETIC 6.67 00:42.5;  Surgeon: Galen Manila, MD;  Location: [facility];  Service: Ophthalmology;  Laterality: Left;   COLONOSCOPY WITH PROPOFOL N/A 02/18/2019   Procedure: COLONOSCOPY WITH PROPOFOL;  Surgeon: Midge Minium, MD;  Location: [facility] ENDOS[facility];  Service: Endoscopy;  Laterality: N/A;   COLONOSCOPY WITH PROPOFOL N/A 12/22/2021   Procedure: COLONOSCOPY WITH PROPOFOL;  Surgeon: Midge Minium, MD;  Location: [facility] ENDOSCOPY;  Service: Endoscopy;  Laterality: N/A;   CORONARY ANGIOPLASTY WITH STENT PLACEMENT Left 2002   CORONARY ARTERY BYPASS GRAFT N/A 01/03/2016   Procedure: 3v CORONARY ARTERY BYPASS GRAFT (LIMA-LAD, SVG-PDA, SVG-OM2); Location: [facility]; Surgeon: Cain Sieve, MD   CYSTOSCOPY W/ URETERAL STENT REMOVAL N/A 10/04/2021   Procedure: CYSTOSCOPY WITH PROSTATE FOREIGN BODY REMOVAL;  Surgeon: Riki Altes, MD;  Location: [facility];  Service: Urology;  Laterality: N/A;   CYSTOSCOPY WITH INSERTION OF UROLIFT N/A 04/29/2019   Procedure: CYSTOSCOPY WITH INSERTION OF UROLIFT;  Surgeon: Riki Altes, MD;  Location: [facility];  Service: Urology;  Laterality: N/A;   ESOPHAGOGASTRODUODENOSCOPY (EGD) WITH PROPOFOL N/A 09/11/2016   Procedure: ESOPHAGOGASTRODUODENOSCOPY (EGD) WITH PROPOFOL;  Surgeon: Midge Minium,  MD;  Location: [facility];  Service: Endoscopy;  Laterality: N/A;   ESOPHAGOGASTRODUODENOSCOPY (EGD) WITH PROPOFOL N/A 10/17/2016   Procedure: ESOPHAGOGASTRODUODENOSCOPY (EGD) WITH PROPOFOL;  Surgeon: Midge Minium, MD;  Location: [facility];  Service: Endoscopy;  Laterality: N/A;   ESOPHAGOGASTRODUODENOSCOPY (EGD) WITH PROPOFOL N/A 11/13/2017   Procedure: ESOPHAGOGASTRODUODENOSCOPY (EGD) WITH PROPOFOL;  Surgeon: Midge Minium, MD;  Location: [facility];  Service: Endoscopy;  Laterality: N/A;   pins R lower leg Left    rotator cuff replaced Right    SAVORY DILATION N/A 10/17/2016   Procedure: SAVORY DILATION;  Surgeon: Midge Minium, MD;  Location: [facility];  Service: Endoscopy;  Laterality: N/A;   TRANSURETHRAL INCISION OF PROSTATE N/A 10/04/2021   Procedure: TRANSURETHRAL INCISION OF THE PROSTATE (TUIP);  Surgeon: Riki Altes, MD;  Location: [facility];  Service: Urology;  Laterality: N/A;    Family History: Family History  Problem Relation Age of Onset   Heart failure Father    Throat cancer Mother    Diabetes Brother     Social History  reports that he quit smoking about 10 years ago. His smoking use included cigarettes. He has never used smokeless tobacco. He reports that he does not currently use alcohol. He reports that he does not use drugs.  No Known Allergies  Medications   Current Facility-Administered Medications:    aspirin EC tablet 81 mg, 81 mg, Oral, Q0600, Floydene Flock,  MD, 81 mg at 05/12/23 2333   atorvastatin (LIPITOR) tablet 80 mg, 80 mg, Oral, Daily, Floydene Flock, MD, 80 mg at 05/12/23 2333   cefTRIAXone (ROCEPHIN) 2 g in sodium chloride 0.9 % 100 mL IVPB, 2 g, Intravenous, Q24H, Floydene Flock, MD, Stopped at 05/12/23 1752   clopidogrel (PLAVIX) tablet 75 mg, 75 mg, Oral, Daily, Floydene Flock, MD, 75 mg at 05/12/23 2334   enoxaparin (LOVENOX) injection 40 mg, 40 mg, Subcutaneous, Q24H, Floydene Flock, MD, 40 mg at 05/12/23 2335    finasteride (PROSCAR) tablet 5 mg, 5 mg, Oral, Daily, Floydene Flock, MD, 5 mg at 05/12/23 2334   gabapentin (NEURONTIN) capsule 300 mg, 300 mg, Oral, Daily, Floydene Flock, MD, 300 mg at 05/12/23 2333   insulin aspart (novoLOG) injection 0-9 Units, 0-9 Units, Subcutaneous, TID WC, Floydene Flock, MD, 1 Units at 05/13/23 0843   insulin aspart (novoLOG) injection 3 Units, 3 Units, Subcutaneous, TID WC, Floydene Flock, MD, 3 Units at 05/13/23 7829   levothyroxine (SYNTHROID) tablet 100 mcg, 100 mcg, Oral, Q0600, Floydene Flock, MD, 100 mcg at 05/13/23 5621   metoprolol tartrate (LOPRESSOR) tablet 25 mg, 25 mg, Oral, BID, Floydene Flock, MD, 25 mg at 05/12/23 2334   metroNIDAZOLE (FLAGYL) IVPB 500 mg, 500 mg, Intravenous, Q12H, Floydene Flock, MD, Last Rate: 100 mL/hr at 05/13/23 0606, 500 mg at 05/13/23 0606   miconazole (MICOTIN) 2 % cream, , Topical, BID, Floydene Flock, MD, 1 Application at 05/12/23 1142   pantoprazole (PROTONIX) EC tablet 40 mg, 40 mg, Oral, Daily, Floydene Flock, MD, 40 mg at 05/12/23 2333   tamsulosin (FLOMAX) capsule 0.4 mg, 0.4 mg, Oral, Daily, Floydene Flock, MD, 0.4 mg at 05/12/23 2334   vancomycin (VANCOREADY) IVPB 2000 mg/400 mL, 2,000 mg, Intravenous, Q24H, Hallaji, Sheema M, RPH   venlafaxine XR (EFFEXOR-XR) 24 hr capsule 150 mg, 150 mg, Oral, Daily, Floydene Flock, MD, 150 mg at 05/12/23 2333  Vitals   Vitals:   05/12/23 2100 05/12/23 2353 05/13/23 0353 05/13/23 0738  BP:  (!) 144/70 (!) 140/86 126/66  Pulse:  91 79 62  Resp:  20  16  Temp:  98.7 F (37.1 C) 98.7 F (37.1 C) 98.4 F (36.9 C)  TempSrc:  Oral Oral   SpO2:  94% 93% 95%  Weight: 101.4 kg     Height: 6\' 1"  (1.854 m)       Body mass index is 29.49 kg/m.  Physical Exam   Constitutional: Appears well-developed and well-nourished.  Psych: Affect appropriate to situation.  Eyes: No scleral injection.  Head: Normocephalic.  Cardiovascular: Normal rate and regular rhythm.   Respiratory: Effort normal, non-labored breathing.    Neurologic Examination   Mental Status: Alert, oriented, thought content appropriate.  Some occasional word finding difficulties noted.  Able to follow 3 step commands with some reinforcement required. Cranial Nerves: II: Visual fields grossly normal III,IV, VI: ptosis not present, extra-ocular motions intact bilaterally V,VII: mild decrease in right NLF, facial light touch sensation normal bilaterally VIII: hearing normal bilaterally XI: bilateral shoulder shrug XII: midline tongue extension Motor: Drift noted in the RUE.  Able to lift all extremities against gravity and maintain Sensory: Pinprick and light touch intact throughout, bilaterally Deep Tendon Reflexes: 2+ and symmetric with absent AJ's bilaterally Plantars: Right: mute   Left: mute Cerebellar: normal finger-to-nose, normal rapid alternating movements and normal heel-to-shin testing bilaterally Gait: not tested due to safety  concerns  Labs   CBC:  Recent Labs  Lab 05/12/23 0052  WBC 14.9*  HGB 13.9  HCT 39.9  MCV 93.2  PLT 160    Basic Metabolic Panel:  Lab Results  Component Value Date   NA 135 05/12/2023   K 3.8 05/12/2023   CO2 24 05/12/2023   GLUCOSE 147 (H) 05/12/2023   BUN 18 05/12/2023   CREATININE 1.04 05/12/2023   CALCIUM 8.6 (L) 05/12/2023   GFRNONAA >60 05/12/2023   GFRAA >60 07/01/2015   Lipid Panel:  Lab Results  Component Value Date   LDLCALC 59 05/13/2023   HgbA1c:  Lab Results  Component Value Date   HGBA1C 6.1 (H) 05/12/2023   Urine Drug Screen:     Component Value Date/Time   LABOPIA NEGATIVE 05/11/2012 2007   COCAINSCRNUR NEGATIVE 05/11/2012 2007   LABBENZ NEGATIVE 05/11/2012 2007   AMPHETMU NEGATIVE 05/11/2012 2007   THCU NEGATIVE 05/11/2012 2007   LABBARB NEGATIVE 05/11/2012 2007    Alcohol Level No results found for: "ETH" INR  Lab Results  Component Value Date   INR 1.2 05/12/2023   APTT  Lab Results   Component Value Date   APTT 24 05/12/2023   AED levels: No results found for: "PHENYTOIN", "ZONISAMIDE", "LAMOTRIGINE", "LEVETIRACETA"  CT angio Head and Neck with contrast(Personally reviewed): CT ANGIOGRAPHY HEAD AND NECK WITH AND WITHOUT CONTRAST   TECHNIQUE: Multidetector CT imaging of the head and neck was performed using the standard protocol during bolus administration of intravenous contrast. Multiplanar CT image reconstructions and MIPs were obtained to evaluate the vascular anatomy. Carotid stenosis measurements (when applicable) are obtained utilizing NASCET criteria, using the distal internal carotid diameter as the denominator.   RADIATION DOSE REDUCTION: This exam was performed according to the departmental dose-optimization program which includes automated exposure control, adjustment of the mA and/or kV according to patient size and/or use of iterative reconstruction technique.   CONTRAST:  75mL OMNIPAQUE IOHEXOL 350 MG/ML SOLN   COMPARISON:  Head CT 0111 hours today. Previous CTA Neck 04/27/2022.   FINDINGS: CTA NECK   Skeleton: Previous sternotomy absent maxillary dentition. Mild for age cervical spine degeneration. No acute osseous abnormality identified. Mild chronic C7, T3, T4 compression fractures appears stable.   Upper chest: Previous CABG. Some upper lobe paraseptal and centrilobular emphysema.   Other neck: No acute finding.   Aortic arch: Calcified aortic atherosclerosis.  Three vessel arch.   Right carotid system: Brachiocephalic artery plaque without stenosis. Suspected streak artifact today at the proximal right CCA on series 7, image 150, no definite genuine stenosis there. Right CCA plaque with progressed and 60-70 % stenosis with respect to the distal vessel at the C4 level, located about 15 mm before the right carotid bifurcation. Superimposed bulky calcified plaque at the right ICA origin and bulb resulting in up to 60-65 % stenosis  with respect to the distal vessel not significantly changed since last year (series 4, image 109). Right ICA remains patent to the skull base.   Left carotid system: Left CCA origin plaque without stenosis. Circumferential soft and calcified plaque in the left CCA at the level of the larynx has mildly progressed from last year with up to 50 % stenosis with respect to the distal vessel on series 4, image 122. Chronic left carotid stent in place and remains patent through the left carotid bifurcation although there is evidence of some in stent stenosis best demonstrated with bone windows on series 4, image 106. This does not  appear significantly changed. Tortuous left ICA below the skull base.   Vertebral arteries: Proximal right subclavian artery atherosclerosis without stenosis. Right vertebral artery origin is patent without significant stenosis. Right vertebral is patent to the skull base with no significant plaque or stenosis.   Proximal left subclavian artery atherosclerosis with up to 50% stenosis is stable. Calcified plaque at the left vertebral artery origin results in moderate to severe stenosis on series 7, image 182, stable. Left vertebral is codominant, remains patent with only mild additional left distal V2 and V3 segment calcified plaque to the skull base.   CTA HEAD   Posterior circulation: Distal vertebral arteries and vertebrobasilar junction are patent without stenosis. Normal PICA origins. Left V4 segment is dominant. Patent basilar artery without stenosis. Patent SCA and PCA origins. Small posterior communicating arteries. Bilateral PCA branches are within normal limits.   Anterior circulation: Left ICA siphon is patent with moderate calcified plaque. Subsequent mild to moderate left siphon stenosis. Right ICA siphon is patent with similar mild to moderate calcified plaque, only mild right siphon stenosis. Normal ophthalmic and posterior communicating artery  origins. Patent carotid termini.   Normal MCA and ACA origins. Normal anterior communicating artery. Bilateral ACA branches are within normal limits. Left MCA M1 segment and trifurcation are patent without stenosis. Right MCA M1 segment and trifurcation are patent without stenosis. Bilateral MCA branches are within normal limits.   Venous sinuses: Early contrast timing, not well evaluated.   Anatomic variants: Dominant left vertebral artery.   Review of the MIP images confirms the above findings   IMPRESSION: 1. Negative for large vessel occlusion. 2. Positive for chronically advanced cervical carotid atherosclerosis. Some progression since Neck CTA last year: - distal Right CCA 60-70% stenosis due to bulky calcified plaque. - proximal Left CCA 50% stenosis due to soft and calcified plaque. 3. Positive also for chronic and stable hemodynamically significant cervical carotid stenosis: - Right ICA origin and bulb 60-65%. - Left ICA in-Stent stenosis series 7, image 182. 4. Stable moderate to severe atherosclerotic stenosis of the dominant Left Vertebral Artery no other significant posterior circulation stenosis. 5. No hemodynamically significant intracranial anterior circulation stenosis. 6. Aortic Atherosclerosis (ICD10-I70.0) and Emphysema (ICD10-J43.9).    MRI Brain(Personally reviewed): MRI HEAD WITHOUT CONTRAST   TECHNIQUE: Multiplanar, multiecho pulse sequences of the brain and surrounding structures were obtained without intravenous contrast.   COMPARISON:  CTA head and neck this morning.  Brain MRI 10/03/2019.   FINDINGS: Brain: Punctate abnormal diffusion in the right superior cerebellar artery territory series 5, image 65. This is not well correlated on ADC, or on coronal DWI.   No other restricted diffusion. No midline shift, mass effect, evidence of mass lesion, or acute intracranial hemorrhage. Cervicomedullary junction and pituitary are within normal  limits.   Chronic left MCA territory encephalomalacia. Ex vacuo left ventricular enlargement. Mild hemosiderin. Gliosis tracking into the left deep white matter and deep gray matter. Mild mid brain Wallerian degeneration. Small chronic right cerebellar infarcts, right AICA or PICA territory.   Other scattered white matter T2 and FLAIR hyperintensity in both hemispheres does not appear significantly changed since 2021. No other cortical encephalomalacia or chronic cerebral blood products. Right deep gray nuclei remain normal.   Vascular: Major intracranial vascular flow voids are stable compared to 2021.   Skull and upper cervical spine: Negative visible cervical spine. Visualized bone marrow signal is within normal limits.   Sinuses/Orbits: Postoperative changes to the left globe. Otherwise stable and negative.  Other: Visible internal auditory structures appear normal. Mastoids are well aerated. Negative visible scalp and face.   IMPRESSION: 1. Evidence of a punctate acute or subacute infarct in the Right superior cerebellum. No associated hemorrhage or mass effect.   2. Otherwise stable MRI appearance of advanced chronic ischemic disease since 06/28/2020, most pronounced in the left MCA and right cerebellar artery territories.    Impression   Paul Bass is a 77 y.o. male with a past medical history of Accelerated junctional rhythm, Anemia, Anxiety, Arthritis, Bilateral carotid artery disease ([name]), BPH (benign prostatic hypertrophy), CAD S/P percutaneous coronary angioplasty, CHF (congestive heart failure) ([name]), Cognitive deficit following cerebrovascular accident (CVA), COPD (chronic obstructive pulmonary disease) ([name]), Cortical cataract, Depression, Dyspnea, GERD (gastroesophageal reflux disease), History of 06-28-2018 novel coronavirus disease (COVID-19) (01/19/2021), History of acute inferior wall MI 28-Jun-2001), History of kidney stones, HTN (hypertension), Hyperlipidemia,  Hypothyroidism, Long term current use of antithrombotics/antiplatelets, OSA (obstructive sleep apnea), PLMD (periodic limb movement disorder), Postoperative atrial fibrillation ([name]) (01/03/2016), Prostatitis, PVD (peripheral vascular disease) ([name]), 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 who presents with fever, chills, worsening right sided weakness and speech.  Patient found to have a scrotal cellulitis and neurological worsening likely secondary to infection being present.  MRI of the brain performed and personally reviewed.  Punctate acute infarct noted in the right superior cerebellum.  Likely secondary to small vessel disease.  Although present not likely contributing to the patient's presentation.  Antibiotics have been started and wife reports today that patient very near baseline.   CTA of the head and neck shows multiple bilateral areas of atherosclerosis.  A1c 6.1, LDL 59.  Echocardiogram shows an EF of 35-40% but no cardiac source of emboli.    Recommendations  Continue ASA, Plavix and statin PT, OT and speech therapy evaluations   ______________________________________________________________________  No further neurologic intervention is recommended at this time.  If further questions arise, please call or page at that time.  Thank you for allowing neurology to participate in the care of this patient.  Thana Farr, MD Neurology  05/13/2023  9:42 AM

## 2023-05-13 NOTE — TOC Initial Note (Signed)
Transition of Care [facility]) - Initial/Assessment Note    Patient Details  Name: Paul Bass MRN: [id number] Date of Birth: Oct 10, 1945  [facility]) CM/SW Contact:    Liliana Cline, LCSW Phone Number: 05/13/2023, 12:31 PM  Clinical Narrative:                 CSW met with patient and spouse at bedside. Patient is from home with spouse. PCP is Lenon Oms. [facility]  Patient recently completed OPST at [facility]. Patient and spouse understand recommendation for OPPT, OT, and ST at discharge. Patient would like to use [facility] again. Referral sent.  Expected Discharge Plan: OP Rehab Barriers to Discharge: Continued Medical Work up   Patient Goals and CMS Choice Patient states their goals for this hospitalization and ongoing recovery are:: would like to do OP rehab again at [facility].gov Compare Post Acute Care list provided to:: Patient Choice offered to / list presented to : Patient, Spouse      Expected Discharge Plan and Services       Living arrangements for the past 2 months: Single Family Home                                      Prior Living Arrangements/Services Living arrangements for the past 2 months: Single Family Home Lives with:: Spouse Patient language and need for interpreter reviewed:: Yes Do you feel safe going back to the place where you live?: Yes      Need for Family Participation in Patient Care: Yes (Comment)     Criminal Activity/Legal Involvement Pertinent to Current Situation/Hospitalization: No - Comment as needed  Activities of Daily Living   ADL Screening (condition at time of admission) Independently performs ADLs?: No Does the patient have a NEW difficulty with bathing/dressing/toileting/self-feeding that is expected to last >3 days?: Yes (Initiates electronic notice to provider for possible OT consult) Does the patient have a NEW difficulty with getting in/out of bed,  walking, or climbing stairs that is expected to last >3 days?: Yes (Initiates electronic notice to provider for possible PT consult) Does the patient have a NEW difficulty with communication that is expected to last >3 days?: Yes (Initiates electronic notice to provider for possible SLP consult) Is the patient deaf or have difficulty hearing?: No Does the patient have difficulty seeing, even when wearing glasses/contacts?: No Does the patient have difficulty concentrating, remembering, or making decisions?: Yes  Permission Sought/Granted Permission sought to share information with : Facility Medical sales representative, Family Supports Permission granted to share information with : Yes, Verbal Permission Granted     Permission granted to share info w AGENCY: [facility]  Permission granted to share info w Relationship: spouse     Emotional Assessment       Orientation: : Oriented to Self, Oriented to Place, Oriented to  Time, Oriented to Situation Alcohol / Substance Use: Not Applicable Psych Involvement: No (comment)  Admission diagnosis:  Cellulitis [L03.90] Generalized weakness [R53.1] Left testicular pain [N50.812] Patient Active Problem List   Diagnosis Date Noted   Cellulitis 05/12/2023   Cellulitis of groin 05/12/2023   Carotid stenosis, symptomatic, with infarction ([name]) 06/05/2022   History of colonic polyps    Anxiety state 11/17/2021   Cognitive deficit, post-stroke 11/17/2021   Ex-smoker 11/17/2021   Bradycardia 02/03/2021   Chronic systolic CHF (congestive  heart failure), NYHA class 2 ([name]) 05/27/2020   Accelerated junctional rhythm 04/21/2019   Encounter for screening colonoscopy    Polyp of colon    CAD (coronary artery disease) 01/14/2019   Depression 01/14/2019   GERD (gastroesophageal reflux disease) 01/14/2019   Periodic limb movement disorder (PLMD) 01/14/2019   Sleep apnea 01/14/2019   Type 2 diabetes mellitus without complications ([name]) 01/14/2019    Stricture and stenosis of esophagus    Problems with swallowing and mastication    Food impaction of esophagus    H/O coronary artery bypass surgery 01/17/2016   Cerebrovascular accident (CVA) due to embolism ([name]) 01/03/2016   Hypothyroidism, unspecified 01/03/2016   HLD (hyperlipidemia) 01/03/2016   Stable angina ([name]) 11/29/2015   Benign essential hypertension 05/27/2015   Bilateral carotid artery stenosis 02/24/2015   Atherosclerotic peripheral vascular disease ([name]) 09/07/2014   Bladder calculus 08/12/2014   Primary osteoarthritis of left knee 03/10/2014   Elevated prostate specific antigen (PSA) 02/05/2013   Snoring 01/22/2013   Sleepiness 01/22/2013   Dysphagia 01/22/2013   Occlusion and stenosis of carotid artery without mention of cerebral infarction 01/22/2013   Cerebral thrombosis with cerebral infarction ([name]) 01/22/2013   Benign prostatic hyperplasia with incomplete bladder emptying 08/26/2012   Chronic prostatitis 08/26/2012   Family history of malignant neoplasm of prostate 08/26/2012   Incomplete emptying of bladder 08/26/2012   Dysphagia 05/14/2012   Global aphasia 05/14/2012   Stroke, acute, thrombotic ([name]) 05/12/2012   Symptomatic carotid artery stenosis 05/12/2012   HTN (hypertension) 05/12/2012   COPD (chronic obstructive pulmonary disease) ([name]) 05/12/2012   Acute respiratory failure ([name]) 05/12/2012   PCP:  Myrene Buddy, NP [facility]   [phone] [place] [place] - [address] [place] [address] Phone: [phone] Fax: [phone]  [id number] [place] [place] [facility] [address] [place] [address] Phone: [phone] Fax: [phone]     Social Determinants of Health (SDOH) Social History: SDOH Screenings   Food Insecurity: No Food Insecurity (05/13/2023)  Housing: Low Risk  (05/13/2023)  Transportation Needs: No Transportation Needs (05/13/2023)  Utilities: Not At Risk  (05/13/2023)  Financial Resource Strain: Low Risk  (03/20/2023)   Received from [facility]  Tobacco Use: Medium Risk (05/12/2023)   SDOH Interventions:     Readmission Risk Interventions    05/13/2023   12:30 PM  Readmission Risk Prevention Plan  Post Dischage Appt Complete  Medication Screening Complete  Transportation Screening Complete

## 2023-05-13 NOTE — Progress Notes (Signed)
Physical Therapy Treatment Patient Details Name: Paul Bass MRN: [id number] DOB: 24-Nov-1945 Today's Date: 05/13/2023   History of Present Illness 77 year old male admitted to [facility] after patient developed fever worsening right-sided weakness generalized malaise. Past medical history significant of CAD, COPD, type 2 diabetes, CVA, BPH presenting with CVA, scrotal cellulitis versus balanitis. 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    PT Comments  Patient received in bed, wife at bedside. He is agreeable to PT session. Reports he feels about the same as yesterday. Has not been up since then. Patient is impulsive with mobility and requires cues for safety. He is mod I with bed mobility. Min A for sit to stand and cga for ambulation with RW. Cues needed for safety, R foot clearance and pacing. He will continue to benefit from skilled PT to improve safety and independence.       If plan is discharge home, recommend the following: A little help with walking and/or transfers;Help with stairs or ramp for entrance;A little help with bathing/dressing/bathroom;Assistance with cooking/housework   Can travel by private vehicle      yes  Equipment Recommendations  Rolling walker (2 wheels)    Recommendations for Other Services       Precautions / Restrictions Precautions Precautions: Fall Restrictions Weight Bearing Restrictions: No     Mobility  Bed Mobility Overal bed mobility: Modified Independent Bed Mobility: Supine to Sit, Sit to Supine     Supine to sit: Modified independent (Device/Increase time) Sit to supine: Modified independent (Device/Increase time)   General bed mobility comments: slightly impulsive, increased time/effort    Transfers Overall transfer level: Needs assistance Equipment used: Rolling walker (2 wheels) Transfers: Sit to/from Stand Sit to Stand: Contact guard assist           General transfer comment: Patient requires cues for hand  placement with RW. He was attempting to stand by pulling on walker. Patient is generally unsteady/impulsive with initial standing    Ambulation/Gait Ambulation/Gait assistance: Contact guard assist Gait Distance (Feet): 125 Feet Assistive device: Rolling walker (2 wheels) Gait Pattern/deviations: Step-through pattern, Decreased dorsiflexion - right Gait velocity: slightly decreased     General Gait Details: Ambulated with RW, occasional R foot catching, requiring cues for clearing right LE effectively. Cues for walker proximity.   Stairs             Wheelchair Mobility     Tilt Bed    Modified Rankin (Stroke Patients Only) Modified Rankin (Stroke Patients Only) Pre-Morbid Rankin Score: Severe disability Modified Rankin: Moderate disability     Balance Overall balance assessment: Needs assistance Sitting-balance support: Feet supported Sitting balance-Leahy Scale: Fair Sitting balance - Comments: impulsive. Trying to get shoes on and falling over on side of bed. Decreased awareness of limitiations   Standing balance support: Bilateral upper extremity supported, During functional activity, Reliant on assistive device for balance Standing balance-Leahy Scale: Fair                              Cognition Arousal: Alert Behavior During Therapy: WFL for tasks assessed/performed, Impulsive Overall Cognitive Status: Impaired/Different from baseline Area of Impairment: Safety/judgement, Awareness, Problem solving                       Following Commands: Follows one step commands consistently Safety/Judgement: Decreased awareness of safety, Decreased awareness of deficits Awareness: Intellectual Problem Solving: Requires verbal cues  Exercises      General Comments        Pertinent Vitals/Pain Pain Assessment Pain Assessment: No/denies pain    Home Living                          Prior Function            PT  Goals (current goals can now be found in the care plan section) Acute Rehab PT Goals Patient Stated Goal: to return home, improve PT Goal Formulation: With patient/family Time For Goal Achievement: 05/26/23 Potential to Achieve Goals: Good Progress towards PT goals: Progressing toward goals    Frequency    Min 1X/week      PT Plan      Co-evaluation              AM-PAC PT "6 Clicks" Mobility   Outcome Measure  Help needed turning from your back to your side while in a flat bed without using bedrails?: None Help needed moving from lying on your back to sitting on the side of a flat bed without using bedrails?: None Help needed moving to and from a bed to a chair (including a wheelchair)?: A Little Help needed standing up from a chair using your arms (e.g., wheelchair or bedside chair)?: A Little Help needed to walk in hospital room?: A Little Help needed climbing 3-5 steps with a railing? : A Little 6 Click Score: 20    End of Session Equipment Utilized During Treatment: Gait belt Activity Tolerance: Patient tolerated treatment well Patient left: in bed;with call bell/phone within reach;with family/visitor present Nurse Communication: Mobility status PT Visit Diagnosis: Unsteadiness on feet (R26.81);Difficulty in walking, not elsewhere classified (R26.2);Hemiplegia and hemiparesis;Muscle weakness (generalized) (M62.81) Hemiplegia - Right/Left: Right Hemiplegia - dominant/non-dominant: Dominant Hemiplegia - caused by: Cerebral infarction     Time: 1130-1145 PT Time Calculation (min) (ACUTE ONLY): 15 min  Charges:    $Gait Training: 8-22 mins PT General Charges $$ ACUTE PT VISIT: 1 Visit                     Paul Bass, PT, GCS 05/13/23,12:03 PM

## 2023-05-13 NOTE — Progress Notes (Signed)
This RN messaged MD Dareen Piano regarding pt's Code Status, as it is listed as "Prior" in chart. Per MD, a conversation will be needed with patient/ family prior to changing. RN to continue to monitor.

## 2023-05-14 ENCOUNTER — Other Ambulatory Visit (HOSPITAL_COMMUNITY): Payer: Self-pay

## 2023-05-14 DIAGNOSIS — N390 Urinary tract infection, site not specified: Secondary | ICD-10-CM

## 2023-05-14 DIAGNOSIS — N50812 Left testicular pain: Secondary | ICD-10-CM | POA: Diagnosis not present

## 2023-05-14 DIAGNOSIS — N492 Inflammatory disorders of scrotum: Secondary | ICD-10-CM | POA: Diagnosis not present

## 2023-05-14 DIAGNOSIS — B965 Pseudomonas (aeruginosa) (mallei) (pseudomallei) as the cause of diseases classified elsewhere: Secondary | ICD-10-CM

## 2023-05-14 DIAGNOSIS — R531 Weakness: Secondary | ICD-10-CM | POA: Diagnosis not present

## 2023-05-14 LAB — GLUCOSE, CAPILLARY
Glucose-Capillary: 158 mg/dL — ABNORMAL HIGH (ref 70–99)
Glucose-Capillary: 184 mg/dL — ABNORMAL HIGH (ref 70–99)
Glucose-Capillary: 135 mg/dL — ABNORMAL HIGH (ref 70–99)
Glucose-Capillary: 264 mg/dL — ABNORMAL HIGH (ref 70–99)

## 2023-05-14 LAB — CBC
HCT: 34.3 % — ABNORMAL LOW (ref 39.0–52.0)
Hemoglobin: 12.2 g/dL — ABNORMAL LOW (ref 13.0–17.0)
MCH: 32.7 pg (ref 26.0–34.0)
MCHC: 35.6 g/dL (ref 30.0–36.0)
MCV: 92 fL (ref 80.0–100.0)
Platelets: 142 10*3/uL — ABNORMAL LOW (ref 150–400)
RBC: 3.73 MIL/uL — ABNORMAL LOW (ref 4.22–5.81)
RDW: 12.7 % (ref 11.5–15.5)
WBC: 8.3 10*3/uL (ref 4.0–10.5)
nRBC: 0 % (ref 0.0–0.2)

## 2023-05-14 MED ORDER — POLYETHYLENE GLYCOL 3350 17 G PO PACK
17.0000 g | PACK | Freq: Two times a day (BID) | ORAL | Status: DC
  Administered 2023-05-14 (×2): 17 g via ORAL
  Filled 2023-05-14 (×3): qty 1

## 2023-05-14 MED ORDER — SODIUM CHLORIDE 0.9 % IV SOLN
2.0000 g | Freq: Two times a day (BID) | INTRAVENOUS | Status: DC
  Administered 2023-05-14 – 2023-05-15 (×2): 2 g via INTRAVENOUS
  Filled 2023-05-14 (×3): qty 12.5

## 2023-05-14 MED ORDER — SACUBITRIL-VALSARTAN 24-26 MG PO TABS
1.0000 | ORAL_TABLET | Freq: Two times a day (BID) | ORAL | Status: DC
  Administered 2023-05-14 – 2023-05-15 (×3): 1 via ORAL
  Filled 2023-05-14 (×3): qty 1

## 2023-05-14 NOTE — TOC Benefit Eligibility Note (Signed)
Patient Product/process development scientist completed.    The patient is insured through [employer]. Patient has Medicare and is not eligible for a copay card, but may be able to apply for patient assistance, if available.    Ran test claim for Entresto 24-26 mg and the current 30 day co-pay is $45.00.   This test claim was processed through [facility]- copay amounts may vary at other pharmacies due to pharmacy/plan contracts, or as the patient moves through the different stages of their insurance plan.     Roland Earl, CPHT Pharmacy Technician III Certified Patient Advocate [facility] Patient Advocate Team Direct Number: [phone]  Fax: [phone]

## 2023-05-14 NOTE — Progress Notes (Signed)
Per Dr Dareen Piano, DC tele monitoring

## 2023-05-14 NOTE — Plan of Care (Signed)
  Problem: Education: Goal: Knowledge of disease or condition will improve Outcome: Progressing Goal: Knowledge of secondary prevention will improve (MUST DOCUMENT ALL) Outcome: Progressing Goal: Knowledge of patient specific risk factors will improve Loraine Leriche N/A or DELETE if not current risk factor) Outcome: Progressing   Problem: Ischemic Stroke/TIA Tissue Perfusion: Goal: Complications of ischemic stroke/TIA will be minimized Outcome: Progressing   Problem: Coping: Goal: Will verbalize positive feelings about self Outcome: Progressing Goal: Will identify appropriate support needs Outcome: Progressing   Problem: Self-Care: Goal: Ability to participate in self-care as condition permits will improve Outcome: Progressing Goal: Verbalization of feelings and concerns over difficulty with self-care will improve Outcome: Progressing Goal: Ability to communicate needs accurately will improve Outcome: Progressing   Problem: Health Behavior/Discharge Planning: Goal: Ability to manage health-related needs will improve Outcome: Progressing Goal: Goals will be collaboratively established with patient/family Outcome: Progressing   Problem: Nutrition: Goal: Risk of aspiration will decrease Outcome: Progressing Goal: Dietary intake will improve Outcome: Progressing   Problem: Coping: Goal: Ability to adjust to condition or change in health will improve Outcome: Progressing   Problem: Fluid Volume: Goal: Ability to maintain a balanced intake and output will improve Outcome: Progressing

## 2023-05-14 NOTE — Progress Notes (Signed)
Pharmacy Antibiotic Note  Paul Bass is a 77 y.o. male admitted on 05/12/2023 with scrotal cellulitis. Urine culture from 05/12/23 growing pseudomonas aeruginosa (susceptibilities pending). Patient remains afebrile and without signs of systemic infection. Pharmacy has been consulted to initiate cefepime.  Plan: Start cefepime 2 g IV every 12 hours based on current renal function Monitor renal function, clinical status, and LOT F/u urine culture susceptibilities and transition to PO antibiotics as able  Height: 6\' 1"  (185.4 cm) Weight: 101.4 kg (223 lb 8.7 oz) IBW/kg (Calculated) : 79.9  Temp (24hrs), Avg:98.7 F (37.1 C), Min:98.3 F (36.8 C), Max:99.3 F (37.4 C)  Recent Labs  Lab 05/12/23 0052 05/12/23 0430 05/14/23 0505  WBC 14.9*  --  8.3  CREATININE 1.04  --   --   LATICACIDVEN  --  1.5  --     Estimated Creatinine Clearance: 74.5 mL/min (by C-G formula based on SCr of 1.04 mg/dL).    No Known Allergies  Antimicrobials this admission: Vancomycin 10/12>>10/13 Flagyl 10/12 >> Ceftriaxone 10/12 >> 10/14 Cefepime 10/14 >>  Dose adjustments this admission: N/A  Microbiology results: 10/12 BCx: NGTD 10/12 UCx: 50k colonies pseudomonas aeruginosa  Thank you for involving pharmacy in this patient's care.   Rockwell Alexandria, PharmD Clinical Pharmacist 05/14/2023 1:42 PM

## 2023-05-14 NOTE — Progress Notes (Signed)
[facility] HF Stewardship  PCP: Bayard Males Hermenia Fiscal, NP  PCP-Cardiologist: None  HPI: Paul Bass is a 77 y.o. male with CAD, COPD, type 2 diabetes, CVA, BPH presenting with CVA, scrotal cellulitis versus balanitis who presented with scrotal cellulitis and subacute CVA. Experienced a worsening of prior stroke defects. Brain MRI showed punctate acute or subacute infarct in the right superior cerebellum. Cellulitis being treated with antibiotics. Troponin on admission was 28. TTE 05/12/23 showed LVEF 35-40%, G1DD  Pertinent cardiac history: History of carotid artery disease known since 2013. LVEF in 2013 was 50-55%. Underwent LHC in 2017 showing multivessel disease now s/p CABG in 12/2015.   Pertinent Lab Values: Creatinine  Date Value Ref Range Status  05/11/2012 0.85 0.60 - 1.30 mg/dL Final   Creatinine, Ser  Date Value Ref Range Status  05/12/2023 1.04 0.61 - 1.24 mg/dL Final   BUN  Date Value Ref Range Status  05/12/2023 18 8 - 23 mg/dL Final  40/98/1191 11 7 - 18 mg/dL Final   Potassium  Date Value Ref Range Status  05/12/2023 3.8 3.5 - 5.1 mmol/L Final  05/11/2012 3.9 3.5 - 5.1 mmol/L Final   Sodium  Date Value Ref Range Status  05/12/2023 135 135 - 145 mmol/L Final  05/11/2012 141 136 - 145 mmol/L Final   Hgb A1c MFr Bld  Date Value Ref Range Status  05/12/2023 6.1 (H) 4.8 - 5.6 % Final    Comment:    (NOTE) Pre diabetes:          5.7%-6.4%  Diabetes:              >6.4%  Glycemic control for   <7.0% adults with diabetes    TSH  Date Value Ref Range Status  05/12/2012 0.398 0.350 - 4.500 uIU/mL Final    Vital Signs: Temp:  [97.8 F (36.6 C)-99.3 F (37.4 C)] 98.8 F (37.1 C) (10/14 0440) Pulse Rate:  [65-81] 65 (10/14 0440) Cardiac Rhythm: Normal sinus rhythm (10/14 0700) Resp:  [16-18] 18 (10/14 0440) BP: (126-162)/(48-87) 162/87 (10/14 0440) SpO2:  [93 %-97 %] 97 % (10/14 0440)   Intake/Output Summary (Last 24 hours) at 05/14/2023 4782 Last data  filed at 05/13/2023 1949 Gross per 24 hour  Intake 1360.06 ml  Output 300 ml  Net 1060.06 ml    Current Inpatient Medications:  -Metoprolol tartrate 25 mg BID  Prior to admission HF Medications:  -Metoprolol tartrate 25 mg BID  Assessment: 1. Acute on chronic systolic heart failure (LVEF 35-40%), due to ICM. NYHA class II symptoms.  -Symptoms: No shortness of breath or LEE. -Volume: Appears euvolemic -Hemodynamics: Markedly hypertensive at 160/100s. HR 73. -BB: Stable on metoprolol tartrate, will benefit from swapping to succinate prior to discharge given HFrEF. -ACEI/ARB/ARNI: Consider starting losartan if permissive HTN is not recommended by neurology -MRA: Consider starting spironolactone 25 mg daily. K low on admission. -SGLT2i: Not an ideal agent to initiate first considering Relatively recurrent UTI on Jardiance.  -Permissive hypertension time period generally recommended for no more than 48 hours post CVA, can consider GDMT titration 05/15/23.  Plan: 1) Medication changes recommended at this time: -Consider transitioning metoprolol tartrate to metoprolol succinate 50 mg daily  2) Patient assistance: -None  3) Education: - Patient has been educated on current HF medications and potential additions to HF medication regimen - Patient verbalizes understanding that over the next few months, these medication doses may change and more medications may be added to optimize HF regimen - Patient has been  educated on basic disease state pathophysiology and goals of therapy  Medication Assistance / Insurance Benefits Check:  Does the patient have prescription insurance?    Type of insurance plan:   Does the patient qualify for medication assistance through manufacturers or grants? Pending   Eligible grants and/or patient assistance programs: pending  Outpatient Pharmacy[facility]     Please do not hesitate to reach out with questions  or concerns,  Enos Fling, PharmD, CPP, BCPS Heart Failure Pharmacist  Phone - [phone] 05/14/2023 8:37 AM

## 2023-05-14 NOTE — Progress Notes (Addendum)
ECHOCARDIOGRAM COMPLETE  Result Date: 05/12/2023    ECHOCARDIOGRAM REPORT   Patient Name:   Paul Bass Date of Exam: 05/12/2023 Medical Rec #:  [id number]      Height:       71.0 in Accession #:    [id number]     Weight:       212.0 lb Date of Birth:  Dec 04, 1945      BSA:          2.161 m Patient Age:    77 years       BP:           139/61 mmHg Patient Gender: M              HR:           55 bpm. Exam Location:  [facility] Procedure: 2D Echo and Intracardiac Opacification Agent Indications:     TIA G45.9  History:         Patient has prior history of Echocardiogram examinations, most                  recent 05/13/2012.  Sonographer:     Overton Mam RDCS, FASE Referring Phys:  [id number] Francoise Schaumann [place] Diagnosing Phys: Lennie Odor MD  Sonographer Comments: Technically difficult study due to poor echo windows, suboptimal apical window and no subcostal window. Image acquisition challenging due to  respiratory motion. IMPRESSIONS  1. Left ventricular ejection fraction, by estimation, is 35 to 40%. The left ventricle has moderately decreased function. The left ventricle demonstrates regional wall motion abnormalities (see scoring diagram/findings for description). The left ventricular internal cavity size was mildly dilated. Left ventricular diastolic parameters are consistent with Grade I diastolic dysfunction (impaired relaxation).  2. Right ventricular systolic function is normal. The right ventricular size is normal. Tricuspid regurgitation signal is inadequate for assessing PA pressure.  3. The mitral valve is grossly normal. Trivial mitral valve regurgitation. No evidence of mitral stenosis.  4. The aortic valve is tricuspid. There is mild calcification of the aortic valve. Aortic valve regurgitation is not visualized. Aortic valve sclerosis is present, with no evidence of aortic valve stenosis. FINDINGS  Left Ventricle: Left ventricular ejection fraction, by estimation, is 35 to 40%. The left ventricle has moderately decreased function. The left ventricle demonstrates regional wall motion abnormalities. Definity contrast agent was given IV to delineate the left ventricular endocardial borders. The left ventricular internal cavity size was mildly dilated. There is no left ventricular hypertrophy. Abnormal (paradoxical) septal motion consistent with post-operative status. Left ventricular diastolic parameters are consistent with Grade I diastolic dysfunction (impaired relaxation).  LV Wall Scoring: The posterior wall and basal inferior segment are hypokinetic. Right Ventricle: The right ventricular size is normal. No increase in right ventricular wall thickness. Right ventricular systolic function is normal. Tricuspid regurgitation signal is inadequate for assessing PA pressure. Left Atrium: Left atrial size was normal in size. Right Atrium: Right atrial size was normal in size. Pericardium: There is no  evidence of pericardial effusion. Mitral Valve: The mitral valve is grossly normal. Trivial mitral valve regurgitation. No evidence of mitral valve stenosis. Tricuspid Valve: The tricuspid valve is grossly normal. Tricuspid valve regurgitation is not demonstrated. No evidence of tricuspid stenosis. Aortic Valve: The aortic valve is tricuspid. There is mild calcification of the aortic valve. Aortic valve regurgitation is not visualized. Aortic valve sclerosis is present, with no evidence of aortic valve stenosis. Aortic valve peak gradient measures 13.8 mmHg. Pulmonic Valve: The pulmonic valve  PROGRESS NOTE  Paul Bass    DOB: [birth year], 77 y.o.  QIH:[id number]    Code Status: Full Code   DOA: 05/12/2023   LOS: 2   Brief hospital course   Paul Bass is a 77 y.o. male with medical history significant of CAD s/p CABG, COPD, type 2 diabetes, CVA, BPH, HLD, CHF, carotid artery stenosis, dysphagia, hypothyroidism, OSA, anxiety, bradycardia.  They presented due to chills, weakness, and left-sided testicular pain for several days. Noted baseline history of CVA with right-sided deficits.  This is acutely worsened. report worsening scrotal and groin redness pain and swelling. Baseline type 2 diabetes.  Blood sugars have been in the 130s at home.  In the ER: Tmax of 100, hemodynamically stable.  Satting well room air.  White count 15, hemoglobin 13.9, platelets 160, urinalysis mildly indicative of infection. Troponin 19-28.  COVID flu and RSV negative.  Lactate within normal limits.   CT head- negative acute intracranial abnormality. Old left MCA infarct with left frontal encephalomalacia. Atrophy, chronic microvascular disease. Brain MRI- punctate acute or subacute infarct in the Right superior cerebellum. No associated hemorrhage or mass effect. scrotal ultrasound: Bilateral hydroceles, simple on the right and complex (multiseptated) left. 2. Bilateral varicoceles. 3. Epididymides are enlarged and heterogeneous in appearance bilaterally, with multiple epididymal cysts and/or spermatoceles, and multiple calcifications. 4. No evidence of testicular torsion, or findings suggestive of epididymo-orchitis.  They were initially treated with IV rocephin, flagyl, vancomycin as well as topical miconazole cream.  Neurology and urology were consulted.   Patient was admitted to medicine service for further workup and management of scrotal cellulitis and stroke as outlined in detail below.  10/13-10/14 -stable, improved. Evaluated by neuro and urology and will continue on IV Abx for  scrotal cellulitis until culture return for Abx guidance going home likely tomorrow  Assessment & Plan  Principal Problem:   Cellulitis Active Problems:   Cerebrovascular accident (CVA) due to embolism ([name])   Cellulitis of groin   CAD (coronary artery disease)   COPD (chronic obstructive pulmonary disease) ([name])   Benign prostatic hyperplasia with incomplete bladder emptying   GERD (gastroesophageal reflux disease)   HLD (hyperlipidemia)   Type 2 diabetes mellitus without complications ([name])   Chronic systolic CHF (congestive heart failure), NYHA class 2 ([name])   Left testicular pain   Generalized weakness   Cellulitis of scrotum   Cerebellar stroke, acute ([name])  Cerebrovascular accident (CVA) due to embolism ([name])- symptoms have resolved. Brain MRI: punctate acute or subacute infarct in the Right superior cerebellum. No associated hemorrhage or mass effect. - neuro evaluated- no changes in management at this time - PT/OT, SLP - continue ASA, plavix, statin   Scrotal cellulitis  UTI:   scrotal US:Bilateral hydroceles, simple on the right and complex (multiseptated) left. 2. Bilateral varicoceles. 3. Epididymides are enlarged and heterogeneous in appearance bilaterally, with multiple epididymal cysts and/or spermatoceles, and multiple calcifications. 4. No evidence of testicular torsion, or findings suggestive of epididymo-orchitis. - urology evaluated and recommended IV antibiotics, scrotal support Urine culture positive for pseudomonas  - continue IV rocephin, flagyl for infectious coverage   - stopped vancomycin. No abscess formation  - UPDATE: UxCx returned positive for pseudomonas. Changed to cefepime - continue topical miconazole  - counseled on continuing good diabetes control  - analgesia PRN   HFpEF  CAD s/p CABG- 2017.  2D ECHO 07/2021 w/ EF 50% and grade 1 diastolic dysfunction. No active CP  - Cont home regimen   -  PROGRESS NOTE  Paul Bass    DOB: [birth year], 77 y.o.  QIH:[id number]    Code Status: Full Code   DOA: 05/12/2023   LOS: 2   Brief hospital course   Paul Bass is a 77 y.o. male with medical history significant of CAD s/p CABG, COPD, type 2 diabetes, CVA, BPH, HLD, CHF, carotid artery stenosis, dysphagia, hypothyroidism, OSA, anxiety, bradycardia.  They presented due to chills, weakness, and left-sided testicular pain for several days. Noted baseline history of CVA with right-sided deficits.  This is acutely worsened. report worsening scrotal and groin redness pain and swelling. Baseline type 2 diabetes.  Blood sugars have been in the 130s at home.  In the ER: Tmax of 100, hemodynamically stable.  Satting well room air.  White count 15, hemoglobin 13.9, platelets 160, urinalysis mildly indicative of infection. Troponin 19-28.  COVID flu and RSV negative.  Lactate within normal limits.   CT head- negative acute intracranial abnormality. Old left MCA infarct with left frontal encephalomalacia. Atrophy, chronic microvascular disease. Brain MRI- punctate acute or subacute infarct in the Right superior cerebellum. No associated hemorrhage or mass effect. scrotal ultrasound: Bilateral hydroceles, simple on the right and complex (multiseptated) left. 2. Bilateral varicoceles. 3. Epididymides are enlarged and heterogeneous in appearance bilaterally, with multiple epididymal cysts and/or spermatoceles, and multiple calcifications. 4. No evidence of testicular torsion, or findings suggestive of epididymo-orchitis.  They were initially treated with IV rocephin, flagyl, vancomycin as well as topical miconazole cream.  Neurology and urology were consulted.   Patient was admitted to medicine service for further workup and management of scrotal cellulitis and stroke as outlined in detail below.  10/13-10/14 -stable, improved. Evaluated by neuro and urology and will continue on IV Abx for  scrotal cellulitis until culture return for Abx guidance going home likely tomorrow  Assessment & Plan  Principal Problem:   Cellulitis Active Problems:   Cerebrovascular accident (CVA) due to embolism ([name])   Cellulitis of groin   CAD (coronary artery disease)   COPD (chronic obstructive pulmonary disease) ([name])   Benign prostatic hyperplasia with incomplete bladder emptying   GERD (gastroesophageal reflux disease)   HLD (hyperlipidemia)   Type 2 diabetes mellitus without complications ([name])   Chronic systolic CHF (congestive heart failure), NYHA class 2 ([name])   Left testicular pain   Generalized weakness   Cellulitis of scrotum   Cerebellar stroke, acute ([name])  Cerebrovascular accident (CVA) due to embolism ([name])- symptoms have resolved. Brain MRI: punctate acute or subacute infarct in the Right superior cerebellum. No associated hemorrhage or mass effect. - neuro evaluated- no changes in management at this time - PT/OT, SLP - continue ASA, plavix, statin   Scrotal cellulitis  UTI:   scrotal US:Bilateral hydroceles, simple on the right and complex (multiseptated) left. 2. Bilateral varicoceles. 3. Epididymides are enlarged and heterogeneous in appearance bilaterally, with multiple epididymal cysts and/or spermatoceles, and multiple calcifications. 4. No evidence of testicular torsion, or findings suggestive of epididymo-orchitis. - urology evaluated and recommended IV antibiotics, scrotal support Urine culture positive for pseudomonas  - continue IV rocephin, flagyl for infectious coverage   - stopped vancomycin. No abscess formation  - UPDATE: UxCx returned positive for pseudomonas. Changed to cefepime - continue topical miconazole  - counseled on continuing good diabetes control  - analgesia PRN   HFpEF  CAD s/p CABG- 2017.  2D ECHO 07/2021 w/ EF 50% and grade 1 diastolic dysfunction. No active CP  - Cont home regimen   -  PROGRESS NOTE  Paul Bass    DOB: [birth year], 77 y.o.  QIH:[id number]    Code Status: Full Code   DOA: 05/12/2023   LOS: 2   Brief hospital course   Paul Bass is a 77 y.o. male with medical history significant of CAD s/p CABG, COPD, type 2 diabetes, CVA, BPH, HLD, CHF, carotid artery stenosis, dysphagia, hypothyroidism, OSA, anxiety, bradycardia.  They presented due to chills, weakness, and left-sided testicular pain for several days. Noted baseline history of CVA with right-sided deficits.  This is acutely worsened. report worsening scrotal and groin redness pain and swelling. Baseline type 2 diabetes.  Blood sugars have been in the 130s at home.  In the ER: Tmax of 100, hemodynamically stable.  Satting well room air.  White count 15, hemoglobin 13.9, platelets 160, urinalysis mildly indicative of infection. Troponin 19-28.  COVID flu and RSV negative.  Lactate within normal limits.   CT head- negative acute intracranial abnormality. Old left MCA infarct with left frontal encephalomalacia. Atrophy, chronic microvascular disease. Brain MRI- punctate acute or subacute infarct in the Right superior cerebellum. No associated hemorrhage or mass effect. scrotal ultrasound: Bilateral hydroceles, simple on the right and complex (multiseptated) left. 2. Bilateral varicoceles. 3. Epididymides are enlarged and heterogeneous in appearance bilaterally, with multiple epididymal cysts and/or spermatoceles, and multiple calcifications. 4. No evidence of testicular torsion, or findings suggestive of epididymo-orchitis.  They were initially treated with IV rocephin, flagyl, vancomycin as well as topical miconazole cream.  Neurology and urology were consulted.   Patient was admitted to medicine service for further workup and management of scrotal cellulitis and stroke as outlined in detail below.  10/13-10/14 -stable, improved. Evaluated by neuro and urology and will continue on IV Abx for  scrotal cellulitis until culture return for Abx guidance going home likely tomorrow  Assessment & Plan  Principal Problem:   Cellulitis Active Problems:   Cerebrovascular accident (CVA) due to embolism ([name])   Cellulitis of groin   CAD (coronary artery disease)   COPD (chronic obstructive pulmonary disease) ([name])   Benign prostatic hyperplasia with incomplete bladder emptying   GERD (gastroesophageal reflux disease)   HLD (hyperlipidemia)   Type 2 diabetes mellitus without complications ([name])   Chronic systolic CHF (congestive heart failure), NYHA class 2 ([name])   Left testicular pain   Generalized weakness   Cellulitis of scrotum   Cerebellar stroke, acute ([name])  Cerebrovascular accident (CVA) due to embolism ([name])- symptoms have resolved. Brain MRI: punctate acute or subacute infarct in the Right superior cerebellum. No associated hemorrhage or mass effect. - neuro evaluated- no changes in management at this time - PT/OT, SLP - continue ASA, plavix, statin   Scrotal cellulitis  UTI:   scrotal US:Bilateral hydroceles, simple on the right and complex (multiseptated) left. 2. Bilateral varicoceles. 3. Epididymides are enlarged and heterogeneous in appearance bilaterally, with multiple epididymal cysts and/or spermatoceles, and multiple calcifications. 4. No evidence of testicular torsion, or findings suggestive of epididymo-orchitis. - urology evaluated and recommended IV antibiotics, scrotal support Urine culture positive for pseudomonas  - continue IV rocephin, flagyl for infectious coverage   - stopped vancomycin. No abscess formation  - UPDATE: UxCx returned positive for pseudomonas. Changed to cefepime - continue topical miconazole  - counseled on continuing good diabetes control  - analgesia PRN   HFpEF  CAD s/p CABG- 2017.  2D ECHO 07/2021 w/ EF 50% and grade 1 diastolic dysfunction. No active CP  - Cont home regimen   -  PROGRESS NOTE  Paul Bass    DOB: [birth year], 77 y.o.  QIH:[id number]    Code Status: Full Code   DOA: 05/12/2023   LOS: 2   Brief hospital course   Paul Bass is a 77 y.o. male with medical history significant of CAD s/p CABG, COPD, type 2 diabetes, CVA, BPH, HLD, CHF, carotid artery stenosis, dysphagia, hypothyroidism, OSA, anxiety, bradycardia.  They presented due to chills, weakness, and left-sided testicular pain for several days. Noted baseline history of CVA with right-sided deficits.  This is acutely worsened. report worsening scrotal and groin redness pain and swelling. Baseline type 2 diabetes.  Blood sugars have been in the 130s at home.  In the ER: Tmax of 100, hemodynamically stable.  Satting well room air.  White count 15, hemoglobin 13.9, platelets 160, urinalysis mildly indicative of infection. Troponin 19-28.  COVID flu and RSV negative.  Lactate within normal limits.   CT head- negative acute intracranial abnormality. Old left MCA infarct with left frontal encephalomalacia. Atrophy, chronic microvascular disease. Brain MRI- punctate acute or subacute infarct in the Right superior cerebellum. No associated hemorrhage or mass effect. scrotal ultrasound: Bilateral hydroceles, simple on the right and complex (multiseptated) left. 2. Bilateral varicoceles. 3. Epididymides are enlarged and heterogeneous in appearance bilaterally, with multiple epididymal cysts and/or spermatoceles, and multiple calcifications. 4. No evidence of testicular torsion, or findings suggestive of epididymo-orchitis.  They were initially treated with IV rocephin, flagyl, vancomycin as well as topical miconazole cream.  Neurology and urology were consulted.   Patient was admitted to medicine service for further workup and management of scrotal cellulitis and stroke as outlined in detail below.  10/13-10/14 -stable, improved. Evaluated by neuro and urology and will continue on IV Abx for  scrotal cellulitis until culture return for Abx guidance going home likely tomorrow  Assessment & Plan  Principal Problem:   Cellulitis Active Problems:   Cerebrovascular accident (CVA) due to embolism ([name])   Cellulitis of groin   CAD (coronary artery disease)   COPD (chronic obstructive pulmonary disease) ([name])   Benign prostatic hyperplasia with incomplete bladder emptying   GERD (gastroesophageal reflux disease)   HLD (hyperlipidemia)   Type 2 diabetes mellitus without complications ([name])   Chronic systolic CHF (congestive heart failure), NYHA class 2 ([name])   Left testicular pain   Generalized weakness   Cellulitis of scrotum   Cerebellar stroke, acute ([name])  Cerebrovascular accident (CVA) due to embolism ([name])- symptoms have resolved. Brain MRI: punctate acute or subacute infarct in the Right superior cerebellum. No associated hemorrhage or mass effect. - neuro evaluated- no changes in management at this time - PT/OT, SLP - continue ASA, plavix, statin   Scrotal cellulitis  UTI:   scrotal US:Bilateral hydroceles, simple on the right and complex (multiseptated) left. 2. Bilateral varicoceles. 3. Epididymides are enlarged and heterogeneous in appearance bilaterally, with multiple epididymal cysts and/or spermatoceles, and multiple calcifications. 4. No evidence of testicular torsion, or findings suggestive of epididymo-orchitis. - urology evaluated and recommended IV antibiotics, scrotal support Urine culture positive for pseudomonas  - continue IV rocephin, flagyl for infectious coverage   - stopped vancomycin. No abscess formation  - UPDATE: UxCx returned positive for pseudomonas. Changed to cefepime - continue topical miconazole  - counseled on continuing good diabetes control  - analgesia PRN   HFpEF  CAD s/p CABG- 2017.  2D ECHO 07/2021 w/ EF 50% and grade 1 diastolic dysfunction. No active CP  - Cont home regimen   -  ECHOCARDIOGRAM COMPLETE  Result Date: 05/12/2023    ECHOCARDIOGRAM REPORT   Patient Name:   Paul Bass Date of Exam: 05/12/2023 Medical Rec #:  [id number]      Height:       71.0 in Accession #:    [id number]     Weight:       212.0 lb Date of Birth:  Dec 04, 1945      BSA:          2.161 m Patient Age:    77 years       BP:           139/61 mmHg Patient Gender: M              HR:           55 bpm. Exam Location:  [facility] Procedure: 2D Echo and Intracardiac Opacification Agent Indications:     TIA G45.9  History:         Patient has prior history of Echocardiogram examinations, most                  recent 05/13/2012.  Sonographer:     Overton Mam RDCS, FASE Referring Phys:  [id number] Francoise Schaumann [place] Diagnosing Phys: Lennie Odor MD  Sonographer Comments: Technically difficult study due to poor echo windows, suboptimal apical window and no subcostal window. Image acquisition challenging due to  respiratory motion. IMPRESSIONS  1. Left ventricular ejection fraction, by estimation, is 35 to 40%. The left ventricle has moderately decreased function. The left ventricle demonstrates regional wall motion abnormalities (see scoring diagram/findings for description). The left ventricular internal cavity size was mildly dilated. Left ventricular diastolic parameters are consistent with Grade I diastolic dysfunction (impaired relaxation).  2. Right ventricular systolic function is normal. The right ventricular size is normal. Tricuspid regurgitation signal is inadequate for assessing PA pressure.  3. The mitral valve is grossly normal. Trivial mitral valve regurgitation. No evidence of mitral stenosis.  4. The aortic valve is tricuspid. There is mild calcification of the aortic valve. Aortic valve regurgitation is not visualized. Aortic valve sclerosis is present, with no evidence of aortic valve stenosis. FINDINGS  Left Ventricle: Left ventricular ejection fraction, by estimation, is 35 to 40%. The left ventricle has moderately decreased function. The left ventricle demonstrates regional wall motion abnormalities. Definity contrast agent was given IV to delineate the left ventricular endocardial borders. The left ventricular internal cavity size was mildly dilated. There is no left ventricular hypertrophy. Abnormal (paradoxical) septal motion consistent with post-operative status. Left ventricular diastolic parameters are consistent with Grade I diastolic dysfunction (impaired relaxation).  LV Wall Scoring: The posterior wall and basal inferior segment are hypokinetic. Right Ventricle: The right ventricular size is normal. No increase in right ventricular wall thickness. Right ventricular systolic function is normal. Tricuspid regurgitation signal is inadequate for assessing PA pressure. Left Atrium: Left atrial size was normal in size. Right Atrium: Right atrial size was normal in size. Pericardium: There is no  evidence of pericardial effusion. Mitral Valve: The mitral valve is grossly normal. Trivial mitral valve regurgitation. No evidence of mitral valve stenosis. Tricuspid Valve: The tricuspid valve is grossly normal. Tricuspid valve regurgitation is not demonstrated. No evidence of tricuspid stenosis. Aortic Valve: The aortic valve is tricuspid. There is mild calcification of the aortic valve. Aortic valve regurgitation is not visualized. Aortic valve sclerosis is present, with no evidence of aortic valve stenosis. Aortic valve peak gradient measures 13.8 mmHg. Pulmonic Valve: The pulmonic valve

## 2023-05-14 NOTE — Progress Notes (Signed)
Physical Therapy Treatment Patient Details Name: Paul Bass MRN: [id number] DOB: 1945/10/03 Today's Date: 05/14/2023   History of Present Illness 77 year old male admitted to [facility] after patient developed fever worsening right-sided weakness generalized malaise. Past medical history significant of CAD, COPD, type 2 diabetes, CVA, BPH presenting with CVA, scrotal cellulitis versus balanitis. 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    PT Comments  Patient is agreeable to PT session. Patient progressed ambulation distance in hallway with rolling walker. Decreased right step length with fatigue with verbal cues required for safety. Patient will need rolling walker for discharge home at this time. Recommend to continue PT to maximize independence. Patient is hopeful to go home tomorrow.     If plan is discharge home, recommend the following: A little help with walking and/or transfers;Help with stairs or ramp for entrance;A little help with bathing/dressing/bathroom;Assistance with cooking/housework   Can travel by private vehicle        Equipment Recommendations  Rolling walker (2 wheels)    Recommendations for Other Services       Precautions / Restrictions Precautions Precautions: Fall Restrictions Weight Bearing Restrictions: No     Mobility  Bed Mobility Overal bed mobility: Modified Independent                  Transfers Overall transfer level: Needs assistance Equipment used: Rolling walker (2 wheels) Transfers: Sit to/from Stand Sit to Stand: Min assist, Supervision           General transfer comment: patient pulling on the rolling walker with both hands initially and required increased assistance for standing. with subsequent stands,  patient was able to carry over insutrction for hand placement with no physical assistance required for lifting.    Ambulation/Gait Ambulation/Gait assistance: Contact guard assist Gait Distance (Feet): 100  Feet Assistive device: Rolling walker (2 wheels) Gait Pattern/deviations: Decreased stride length, Decreased stance time - right, Decreased dorsiflexion - right Gait velocity: decreased     General Gait Details: patient ambulated in hallway with occasional cues for increased step length and foot placement. patient reports feeling generally unsteady with continued right side weakness. reinforced importance of rolling walker for safety and fall prevention at home   Stairs             Wheelchair Mobility     Tilt Bed    Modified Rankin (Stroke Patients Only)       Balance Overall balance assessment: Needs assistance Sitting-balance support: Feet supported Sitting balance-Leahy Scale: Fair     Standing balance support: Bilateral upper extremity supported, During functional activity, Reliant on assistive device for balance Standing balance-Leahy Scale: Fair                              Cognition Arousal: Alert Behavior During Therapy: WFL for tasks assessed/performed Overall Cognitive Status: Difficult to assess                                          Exercises Other Exercises Other Exercises: sit to stand performed x 3 bouts for exercise with supervision for safety and reinforcement of hand placement    General Comments        Pertinent Vitals/Pain Pain Assessment Pain Assessment: No/denies pain    Home Living  Prior Function            PT Goals (current goals can now be found in the care plan section) Acute Rehab PT Goals Patient Stated Goal: to return home PT Goal Formulation: With patient/family Time For Goal Achievement: 05/26/23 Potential to Achieve Goals: Good Progress towards PT goals: Progressing toward goals    Frequency    Min 1X/week      PT Plan      Co-evaluation              AM-PAC PT "6 Clicks" Mobility   Outcome Measure  Help needed turning from your  back to your side while in a flat bed without using bedrails?: None Help needed moving from lying on your back to sitting on the side of a flat bed without using bedrails?: None Help needed moving to and from a bed to a chair (including a wheelchair)?: A Little Help needed standing up from a chair using your arms (e.g., wheelchair or bedside chair)?: A Little Help needed to walk in hospital room?: A Little Help needed climbing 3-5 steps with a railing? : A Little 6 Click Score: 20    End of Session   Activity Tolerance: Patient tolerated treatment well Patient left: in bed;with call bell/phone within reach;with family/visitor present   PT Visit Diagnosis: Unsteadiness on feet (R26.81);Difficulty in walking, not elsewhere classified (R26.2);Hemiplegia and hemiparesis;Muscle weakness (generalized) (M62.81) Hemiplegia - Right/Left: Right     Time: 3151-7616 PT Time Calculation (min) (ACUTE ONLY): 14 min  Charges:    $Gait Training: 8-22 mins PT General Charges $$ ACUTE PT VISIT: 1 Visit                     Donna Bernard, PT, MPT    [name] 05/14/2023, 12:48 PM

## 2023-05-15 DIAGNOSIS — N3000 Acute cystitis without hematuria: Secondary | ICD-10-CM | POA: Diagnosis not present

## 2023-05-15 DIAGNOSIS — R531 Weakness: Secondary | ICD-10-CM | POA: Diagnosis not present

## 2023-05-15 DIAGNOSIS — A498 Other bacterial infections of unspecified site: Secondary | ICD-10-CM

## 2023-05-15 DIAGNOSIS — N50812 Left testicular pain: Secondary | ICD-10-CM | POA: Diagnosis not present

## 2023-05-15 LAB — BASIC METABOLIC PANEL
Anion gap: 9 (ref 5–15)
BUN: 13 mg/dL (ref 8–23)
CO2: 23 mmol/L (ref 22–32)
Calcium: 8 mg/dL — ABNORMAL LOW (ref 8.9–10.3)
Chloride: 102 mmol/L (ref 98–111)
Creatinine, Ser: 0.8 mg/dL (ref 0.61–1.24)
GFR, Estimated: >60 mL/min (ref >60)
Glucose, Bld: 169 mg/dL — ABNORMAL HIGH (ref 70–99)
Potassium: 3.7 mmol/L (ref 3.5–5.1)
Sodium: 134 mmol/L — ABNORMAL LOW (ref 135–145)

## 2023-05-15 LAB — URINE CULTURE

## 2023-05-15 LAB — CBC
HCT: 36.4 % — ABNORMAL LOW (ref 39.0–52.0)
Hemoglobin: 12.5 g/dL — ABNORMAL LOW (ref 13.0–17.0)
MCH: 31.7 pg (ref 26.0–34.0)
MCHC: 34.3 g/dL (ref 30.0–36.0)
MCV: 92.4 fL (ref 80.0–100.0)
Platelets: 157 10*3/uL (ref 150–400)
RBC: 3.94 MIL/uL — ABNORMAL LOW (ref 4.22–5.81)
RDW: 12.4 % (ref 11.5–15.5)
WBC: 5.8 10*3/uL (ref 4.0–10.5)
nRBC: 0 % (ref 0.0–0.2)

## 2023-05-15 LAB — GLUCOSE, CAPILLARY: Glucose-Capillary: 165 mg/dL — ABNORMAL HIGH (ref 70–99)

## 2023-05-15 MED ORDER — CIPROFLOXACIN HCL 500 MG PO TABS: 500.0000 mg | ORAL_TABLET | Freq: Two times a day (BID) | ORAL | 0 refills | Status: AC

## 2023-05-15 MED ORDER — SACUBITRIL-VALSARTAN 24-26 MG PO TABS: 1.0000 | ORAL_TABLET | Freq: Two times a day (BID) | ORAL | 0 refills | Status: AC

## 2023-05-15 MED ORDER — MICONAZOLE NITRATE 2 % EX CREA: TOPICAL_CREAM | Freq: Two times a day (BID) | CUTANEOUS | 0 refills | Status: AC

## 2023-05-15 NOTE — Discharge Instructions (Signed)
Please keep your urology follow up appointment tomorrow. I have prescribed an antibiotic course and a topical antifungal cream for your infection.  Please monitor your blood sugars and follow up with your primary doctor to discuss good diabetes control to help prevent infections.  In addition, please follow up with your primary doctor within a week to monitor your blood pressure. You're blood pressures were very high while inpatient so a new blood pressure medication was started and helped to control your pressures better. You may need to increase the dose in the future to get to goal blood pressure.

## 2023-05-15 NOTE — Discharge Summary (Signed)
Physician Discharge Summary  Patient: Paul Bass NWG:[id number] DOB: 20-Dec-1945   Code Status: Full Code Admit date: 05/12/2023 Discharge date: 05/15/2023 Disposition: Home, No home health services recommended PCP: Myrene Buddy, NP  Recommendations for Outpatient Follow-up:  Follow up with PCP within 1-2 weeks Regarding general hospital follow up and preventative care Recommend close control of diabetes to help prevent infection Started on entresto this admission, please monitor blood pressures which were not at goal prior to dc and evaluate for need of dose increase Follow up with urology as scheduled  tomorrow Follow up with neurology  Regarding post-stroke care and prevention  Discharge Diagnoses:  Principal Problem:   Cellulitis Active Problems:   Cerebrovascular accident (CVA) due to embolism ([name])   Cellulitis of groin   CAD (coronary artery disease)   COPD (chronic obstructive pulmonary disease) ([name])   Benign prostatic hyperplasia with incomplete bladder emptying   GERD (gastroesophageal reflux disease)   HLD (hyperlipidemia)   Type 2 diabetes mellitus without complications ([name])   Chronic systolic CHF (congestive heart failure), NYHA class 2 ([name])   Left testicular pain   Generalized weakness   Cellulitis of scrotum   Cerebellar stroke, acute ([name])   Pseudomonas urinary tract infection   Acute cystitis without hematuria   Pseudomonas infection  Brief Hospital Course Summary: Paul Bass is a 77 y.o. male with medical history significant of CAD s/p CABG, COPD, type 2 diabetes, CVA, BPH, HLD, CHF, carotid artery stenosis, dysphagia, hypothyroidism, OSA, anxiety, bradycardia.   They presented due to chills, weakness, and left-sided testicular pain for several days. Noted baseline history of CVA with right-sided deficits.  This is acutely worsened. report worsening scrotal and groin redness pain and swelling. Baseline type 2 diabetes.  Blood sugars  have been in the 130s at home.   In the ER: Tmax of 100, hemodynamically stable.  Satting well room air.  White count 15, hemoglobin 13.9, platelets 160, urinalysis mildly indicative of infection. Troponin 19-28.  COVID flu and RSV negative.  Lactate within normal limits.   CT head- negative acute intracranial abnormality. Old left MCA infarct with left frontal encephalomalacia. Atrophy, chronic microvascular disease. Brain MRI- punctate acute or subacute infarct in the Right superior cerebellum. No associated hemorrhage or mass effect. scrotal ultrasound: Bilateral hydroceles, simple on the right and complex (multiseptated) left. 2. Bilateral varicoceles. 3. Epididymides are enlarged and heterogeneous in appearance bilaterally, with multiple epididymal cysts and/or spermatoceles, and multiple calcifications. 4. No evidence of testicular torsion, or findings suggestive of epididymo-orchitis.   They were initially treated with IV rocephin, flagyl, vancomycin as well as topical miconazole cream.  Neurology and urology were consulted.    Patient was admitted to medicine service for further workup and management of scrotal cellulitis and stroke as outlined in detail below.   10/13-10/14 -stable, improved. Evaluated by neuro and urology and will continue on IV Abx for scrotal cellulitis until culture return. Neurology did not recommend any further neurological workup and he was asymptomatic.  scrotal US showed: Bilateral hydroceles, simple on the right and complex (multiseptated) left. Bilateral varicoceles. Epididymides are enlarged and heterogeneous in appearance bilaterally, with multiple epididymal cysts and/or spermatoceles, and multiple calcifications. No evidence of testicular torsion, or findings suggestive of epididymo-orchitis. UPDATE: late afternoon 10/14 urine cultures returned with pseudomonas so he was switched from ceftriaxone and flagyl to cefepime. He continued the topical  antifungal.  10/15: culture sensitivities returned as sensitive to ciprofloxacin. He had improving symptoms and exam,  Physician Discharge Summary  Patient: Paul Bass NWG:[id number] DOB: 20-Dec-1945   Code Status: Full Code Admit date: 05/12/2023 Discharge date: 05/15/2023 Disposition: Home, No home health services recommended PCP: Myrene Buddy, NP  Recommendations for Outpatient Follow-up:  Follow up with PCP within 1-2 weeks Regarding general hospital follow up and preventative care Recommend close control of diabetes to help prevent infection Started on entresto this admission, please monitor blood pressures which were not at goal prior to dc and evaluate for need of dose increase Follow up with urology as scheduled  tomorrow Follow up with neurology  Regarding post-stroke care and prevention  Discharge Diagnoses:  Principal Problem:   Cellulitis Active Problems:   Cerebrovascular accident (CVA) due to embolism ([name])   Cellulitis of groin   CAD (coronary artery disease)   COPD (chronic obstructive pulmonary disease) ([name])   Benign prostatic hyperplasia with incomplete bladder emptying   GERD (gastroesophageal reflux disease)   HLD (hyperlipidemia)   Type 2 diabetes mellitus without complications ([name])   Chronic systolic CHF (congestive heart failure), NYHA class 2 ([name])   Left testicular pain   Generalized weakness   Cellulitis of scrotum   Cerebellar stroke, acute ([name])   Pseudomonas urinary tract infection   Acute cystitis without hematuria   Pseudomonas infection  Brief Hospital Course Summary: Paul Bass is a 77 y.o. male with medical history significant of CAD s/p CABG, COPD, type 2 diabetes, CVA, BPH, HLD, CHF, carotid artery stenosis, dysphagia, hypothyroidism, OSA, anxiety, bradycardia.   They presented due to chills, weakness, and left-sided testicular pain for several days. Noted baseline history of CVA with right-sided deficits.  This is acutely worsened. report worsening scrotal and groin redness pain and swelling. Baseline type 2 diabetes.  Blood sugars  have been in the 130s at home.   In the ER: Tmax of 100, hemodynamically stable.  Satting well room air.  White count 15, hemoglobin 13.9, platelets 160, urinalysis mildly indicative of infection. Troponin 19-28.  COVID flu and RSV negative.  Lactate within normal limits.   CT head- negative acute intracranial abnormality. Old left MCA infarct with left frontal encephalomalacia. Atrophy, chronic microvascular disease. Brain MRI- punctate acute or subacute infarct in the Right superior cerebellum. No associated hemorrhage or mass effect. scrotal ultrasound: Bilateral hydroceles, simple on the right and complex (multiseptated) left. 2. Bilateral varicoceles. 3. Epididymides are enlarged and heterogeneous in appearance bilaterally, with multiple epididymal cysts and/or spermatoceles, and multiple calcifications. 4. No evidence of testicular torsion, or findings suggestive of epididymo-orchitis.   They were initially treated with IV rocephin, flagyl, vancomycin as well as topical miconazole cream.  Neurology and urology were consulted.    Patient was admitted to medicine service for further workup and management of scrotal cellulitis and stroke as outlined in detail below.   10/13-10/14 -stable, improved. Evaluated by neuro and urology and will continue on IV Abx for scrotal cellulitis until culture return. Neurology did not recommend any further neurological workup and he was asymptomatic.  scrotal US showed: Bilateral hydroceles, simple on the right and complex (multiseptated) left. Bilateral varicoceles. Epididymides are enlarged and heterogeneous in appearance bilaterally, with multiple epididymal cysts and/or spermatoceles, and multiple calcifications. No evidence of testicular torsion, or findings suggestive of epididymo-orchitis. UPDATE: late afternoon 10/14 urine cultures returned with pseudomonas so he was switched from ceftriaxone and flagyl to cefepime. He continued the topical  antifungal.  10/15: culture sensitivities returned as sensitive to ciprofloxacin. He had improving symptoms and exam,  Physician Discharge Summary  Patient: Paul Bass NWG:[id number] DOB: 20-Dec-1945   Code Status: Full Code Admit date: 05/12/2023 Discharge date: 05/15/2023 Disposition: Home, No home health services recommended PCP: Myrene Buddy, NP  Recommendations for Outpatient Follow-up:  Follow up with PCP within 1-2 weeks Regarding general hospital follow up and preventative care Recommend close control of diabetes to help prevent infection Started on entresto this admission, please monitor blood pressures which were not at goal prior to dc and evaluate for need of dose increase Follow up with urology as scheduled  tomorrow Follow up with neurology  Regarding post-stroke care and prevention  Discharge Diagnoses:  Principal Problem:   Cellulitis Active Problems:   Cerebrovascular accident (CVA) due to embolism ([name])   Cellulitis of groin   CAD (coronary artery disease)   COPD (chronic obstructive pulmonary disease) ([name])   Benign prostatic hyperplasia with incomplete bladder emptying   GERD (gastroesophageal reflux disease)   HLD (hyperlipidemia)   Type 2 diabetes mellitus without complications ([name])   Chronic systolic CHF (congestive heart failure), NYHA class 2 ([name])   Left testicular pain   Generalized weakness   Cellulitis of scrotum   Cerebellar stroke, acute ([name])   Pseudomonas urinary tract infection   Acute cystitis without hematuria   Pseudomonas infection  Brief Hospital Course Summary: Paul Bass is a 77 y.o. male with medical history significant of CAD s/p CABG, COPD, type 2 diabetes, CVA, BPH, HLD, CHF, carotid artery stenosis, dysphagia, hypothyroidism, OSA, anxiety, bradycardia.   They presented due to chills, weakness, and left-sided testicular pain for several days. Noted baseline history of CVA with right-sided deficits.  This is acutely worsened. report worsening scrotal and groin redness pain and swelling. Baseline type 2 diabetes.  Blood sugars  have been in the 130s at home.   In the ER: Tmax of 100, hemodynamically stable.  Satting well room air.  White count 15, hemoglobin 13.9, platelets 160, urinalysis mildly indicative of infection. Troponin 19-28.  COVID flu and RSV negative.  Lactate within normal limits.   CT head- negative acute intracranial abnormality. Old left MCA infarct with left frontal encephalomalacia. Atrophy, chronic microvascular disease. Brain MRI- punctate acute or subacute infarct in the Right superior cerebellum. No associated hemorrhage or mass effect. scrotal ultrasound: Bilateral hydroceles, simple on the right and complex (multiseptated) left. 2. Bilateral varicoceles. 3. Epididymides are enlarged and heterogeneous in appearance bilaterally, with multiple epididymal cysts and/or spermatoceles, and multiple calcifications. 4. No evidence of testicular torsion, or findings suggestive of epididymo-orchitis.   They were initially treated with IV rocephin, flagyl, vancomycin as well as topical miconazole cream.  Neurology and urology were consulted.    Patient was admitted to medicine service for further workup and management of scrotal cellulitis and stroke as outlined in detail below.   10/13-10/14 -stable, improved. Evaluated by neuro and urology and will continue on IV Abx for scrotal cellulitis until culture return. Neurology did not recommend any further neurological workup and he was asymptomatic.  scrotal US showed: Bilateral hydroceles, simple on the right and complex (multiseptated) left. Bilateral varicoceles. Epididymides are enlarged and heterogeneous in appearance bilaterally, with multiple epididymal cysts and/or spermatoceles, and multiple calcifications. No evidence of testicular torsion, or findings suggestive of epididymo-orchitis. UPDATE: late afternoon 10/14 urine cultures returned with pseudomonas so he was switched from ceftriaxone and flagyl to cefepime. He continued the topical  antifungal.  10/15: culture sensitivities returned as sensitive to ciprofloxacin. He had improving symptoms and exam,  venlafaxine XR 150 MG 24 hr capsule Commonly known as: EFFEXOR-XR Take 150 mg by mouth daily.        Follow-up Information     Gauger, Hermenia Fiscal, NP. Schedule an appointment as soon as possible for a visit in 1 week(s).   Specialty: Internal Medicine Contact information: [address] [facility] [place] [address] [phone]         urology. Go  in 1 day(s).                  Subjective   Pt reports feeling well. His scrotum swelling has greatly improved as well as the pain and redness. Denies dysuria. Denies any weakness.  Has follow up with urology tomorrow.   All questions and concerns were addressed at time of discharge.  Objective  Blood pressure (!) 152/82, pulse 66, temperature 98.6 F (37 C), temperature source Oral, resp. rate 18, height 6\' 1"  (1.854 m), weight 101.4 kg, SpO2 96%.   General: Pt is alert, awake, not in acute distress Cardiovascular: RRR, S1/S2 +, no rubs, no gallops Respiratory: CTA bilaterally, no wheezing, no rhonchi Abdominal: Soft, NT, ND, bowel sounds + Extremities: no edema, no cyanosis GU: significant scrotal edema but much improved. Non-tender to palpation. Glans penis pale.   The results of significant diagnostics from this hospitalization (including imaging, microbiology, ancillary and laboratory) are listed below for reference.   Imaging studies: ECHOCARDIOGRAM COMPLETE  Result Date: 05/12/2023    ECHOCARDIOGRAM REPORT   Patient Name:   Paul Bass Date of Exam: 05/12/2023 Medical Rec #:  [id number]      Height:       71.0 in Accession #:    [id number]     Weight:       212.0 lb Date of Birth:  08-28-1945      BSA:          2.161 m Patient Age:    77 years       BP:           139/61 mmHg Patient Gender: M              HR:           55 bpm. Exam Location:  [facility] Procedure: 2D Echo and Intracardiac Opacification Agent Indications:     TIA G45.9  History:         Patient has prior history of Echocardiogram examinations, most                  recent 05/13/2012.  Sonographer:     Overton Mam RDCS, FASE Referring Phys:  [id number] Francoise Schaumann [place] Diagnosing Phys: Lennie Odor MD  Sonographer Comments: Technically difficult study due to poor echo windows, suboptimal apical window and no subcostal window. Image acquisition challenging due to respiratory motion. IMPRESSIONS  1. Left ventricular  ejection fraction, by estimation, is 35 to 40%. The left ventricle has moderately decreased function. The left ventricle demonstrates regional wall motion abnormalities (see scoring diagram/findings for description). The left ventricular internal cavity size was mildly dilated. Left ventricular diastolic parameters are consistent with Grade I diastolic dysfunction (impaired relaxation).  2. Right ventricular systolic function is normal. The right ventricular size is normal. Tricuspid regurgitation signal is inadequate for assessing PA pressure.  3. The mitral valve is grossly normal. Trivial mitral valve regurgitation. No evidence of mitral stenosis.  4. The aortic valve is tricuspid. There is mild calcification of the aortic  Physician Discharge Summary  Patient: Paul Bass NWG:[id number] DOB: 20-Dec-1945   Code Status: Full Code Admit date: 05/12/2023 Discharge date: 05/15/2023 Disposition: Home, No home health services recommended PCP: Myrene Buddy, NP  Recommendations for Outpatient Follow-up:  Follow up with PCP within 1-2 weeks Regarding general hospital follow up and preventative care Recommend close control of diabetes to help prevent infection Started on entresto this admission, please monitor blood pressures which were not at goal prior to dc and evaluate for need of dose increase Follow up with urology as scheduled  tomorrow Follow up with neurology  Regarding post-stroke care and prevention  Discharge Diagnoses:  Principal Problem:   Cellulitis Active Problems:   Cerebrovascular accident (CVA) due to embolism ([name])   Cellulitis of groin   CAD (coronary artery disease)   COPD (chronic obstructive pulmonary disease) ([name])   Benign prostatic hyperplasia with incomplete bladder emptying   GERD (gastroesophageal reflux disease)   HLD (hyperlipidemia)   Type 2 diabetes mellitus without complications ([name])   Chronic systolic CHF (congestive heart failure), NYHA class 2 ([name])   Left testicular pain   Generalized weakness   Cellulitis of scrotum   Cerebellar stroke, acute ([name])   Pseudomonas urinary tract infection   Acute cystitis without hematuria   Pseudomonas infection  Brief Hospital Course Summary: Paul Bass is a 77 y.o. male with medical history significant of CAD s/p CABG, COPD, type 2 diabetes, CVA, BPH, HLD, CHF, carotid artery stenosis, dysphagia, hypothyroidism, OSA, anxiety, bradycardia.   They presented due to chills, weakness, and left-sided testicular pain for several days. Noted baseline history of CVA with right-sided deficits.  This is acutely worsened. report worsening scrotal and groin redness pain and swelling. Baseline type 2 diabetes.  Blood sugars  have been in the 130s at home.   In the ER: Tmax of 100, hemodynamically stable.  Satting well room air.  White count 15, hemoglobin 13.9, platelets 160, urinalysis mildly indicative of infection. Troponin 19-28.  COVID flu and RSV negative.  Lactate within normal limits.   CT head- negative acute intracranial abnormality. Old left MCA infarct with left frontal encephalomalacia. Atrophy, chronic microvascular disease. Brain MRI- punctate acute or subacute infarct in the Right superior cerebellum. No associated hemorrhage or mass effect. scrotal ultrasound: Bilateral hydroceles, simple on the right and complex (multiseptated) left. 2. Bilateral varicoceles. 3. Epididymides are enlarged and heterogeneous in appearance bilaterally, with multiple epididymal cysts and/or spermatoceles, and multiple calcifications. 4. No evidence of testicular torsion, or findings suggestive of epididymo-orchitis.   They were initially treated with IV rocephin, flagyl, vancomycin as well as topical miconazole cream.  Neurology and urology were consulted.    Patient was admitted to medicine service for further workup and management of scrotal cellulitis and stroke as outlined in detail below.   10/13-10/14 -stable, improved. Evaluated by neuro and urology and will continue on IV Abx for scrotal cellulitis until culture return. Neurology did not recommend any further neurological workup and he was asymptomatic.  scrotal US showed: Bilateral hydroceles, simple on the right and complex (multiseptated) left. Bilateral varicoceles. Epididymides are enlarged and heterogeneous in appearance bilaterally, with multiple epididymal cysts and/or spermatoceles, and multiple calcifications. No evidence of testicular torsion, or findings suggestive of epididymo-orchitis. UPDATE: late afternoon 10/14 urine cultures returned with pseudomonas so he was switched from ceftriaxone and flagyl to cefepime. He continued the topical  antifungal.  10/15: culture sensitivities returned as sensitive to ciprofloxacin. He had improving symptoms and exam,  venlafaxine XR 150 MG 24 hr capsule Commonly known as: EFFEXOR-XR Take 150 mg by mouth daily.        Follow-up Information     Gauger, Hermenia Fiscal, NP. Schedule an appointment as soon as possible for a visit in 1 week(s).   Specialty: Internal Medicine Contact information: [address] [facility] [place] [address] [phone]         urology. Go  in 1 day(s).                  Subjective   Pt reports feeling well. His scrotum swelling has greatly improved as well as the pain and redness. Denies dysuria. Denies any weakness.  Has follow up with urology tomorrow.   All questions and concerns were addressed at time of discharge.  Objective  Blood pressure (!) 152/82, pulse 66, temperature 98.6 F (37 C), temperature source Oral, resp. rate 18, height 6\' 1"  (1.854 m), weight 101.4 kg, SpO2 96%.   General: Pt is alert, awake, not in acute distress Cardiovascular: RRR, S1/S2 +, no rubs, no gallops Respiratory: CTA bilaterally, no wheezing, no rhonchi Abdominal: Soft, NT, ND, bowel sounds + Extremities: no edema, no cyanosis GU: significant scrotal edema but much improved. Non-tender to palpation. Glans penis pale.   The results of significant diagnostics from this hospitalization (including imaging, microbiology, ancillary and laboratory) are listed below for reference.   Imaging studies: ECHOCARDIOGRAM COMPLETE  Result Date: 05/12/2023    ECHOCARDIOGRAM REPORT   Patient Name:   Paul Bass Date of Exam: 05/12/2023 Medical Rec #:  [id number]      Height:       71.0 in Accession #:    [id number]     Weight:       212.0 lb Date of Birth:  08-28-1945      BSA:          2.161 m Patient Age:    77 years       BP:           139/61 mmHg Patient Gender: M              HR:           55 bpm. Exam Location:  [facility] Procedure: 2D Echo and Intracardiac Opacification Agent Indications:     TIA G45.9  History:         Patient has prior history of Echocardiogram examinations, most                  recent 05/13/2012.  Sonographer:     Overton Mam RDCS, FASE Referring Phys:  [id number] Francoise Schaumann [place] Diagnosing Phys: Lennie Odor MD  Sonographer Comments: Technically difficult study due to poor echo windows, suboptimal apical window and no subcostal window. Image acquisition challenging due to respiratory motion. IMPRESSIONS  1. Left ventricular  ejection fraction, by estimation, is 35 to 40%. The left ventricle has moderately decreased function. The left ventricle demonstrates regional wall motion abnormalities (see scoring diagram/findings for description). The left ventricular internal cavity size was mildly dilated. Left ventricular diastolic parameters are consistent with Grade I diastolic dysfunction (impaired relaxation).  2. Right ventricular systolic function is normal. The right ventricular size is normal. Tricuspid regurgitation signal is inadequate for assessing PA pressure.  3. The mitral valve is grossly normal. Trivial mitral valve regurgitation. No evidence of mitral stenosis.  4. The aortic valve is tricuspid. There is mild calcification of the aortic  venlafaxine XR 150 MG 24 hr capsule Commonly known as: EFFEXOR-XR Take 150 mg by mouth daily.        Follow-up Information     Gauger, Hermenia Fiscal, NP. Schedule an appointment as soon as possible for a visit in 1 week(s).   Specialty: Internal Medicine Contact information: [address] [facility] [place] [address] [phone]         urology. Go  in 1 day(s).                  Subjective   Pt reports feeling well. His scrotum swelling has greatly improved as well as the pain and redness. Denies dysuria. Denies any weakness.  Has follow up with urology tomorrow.   All questions and concerns were addressed at time of discharge.  Objective  Blood pressure (!) 152/82, pulse 66, temperature 98.6 F (37 C), temperature source Oral, resp. rate 18, height 6\' 1"  (1.854 m), weight 101.4 kg, SpO2 96%.   General: Pt is alert, awake, not in acute distress Cardiovascular: RRR, S1/S2 +, no rubs, no gallops Respiratory: CTA bilaterally, no wheezing, no rhonchi Abdominal: Soft, NT, ND, bowel sounds + Extremities: no edema, no cyanosis GU: significant scrotal edema but much improved. Non-tender to palpation. Glans penis pale.   The results of significant diagnostics from this hospitalization (including imaging, microbiology, ancillary and laboratory) are listed below for reference.   Imaging studies: ECHOCARDIOGRAM COMPLETE  Result Date: 05/12/2023    ECHOCARDIOGRAM REPORT   Patient Name:   Paul Bass Date of Exam: 05/12/2023 Medical Rec #:  [id number]      Height:       71.0 in Accession #:    [id number]     Weight:       212.0 lb Date of Birth:  08-28-1945      BSA:          2.161 m Patient Age:    77 years       BP:           139/61 mmHg Patient Gender: M              HR:           55 bpm. Exam Location:  [facility] Procedure: 2D Echo and Intracardiac Opacification Agent Indications:     TIA G45.9  History:         Patient has prior history of Echocardiogram examinations, most                  recent 05/13/2012.  Sonographer:     Overton Mam RDCS, FASE Referring Phys:  [id number] Francoise Schaumann [place] Diagnosing Phys: Lennie Odor MD  Sonographer Comments: Technically difficult study due to poor echo windows, suboptimal apical window and no subcostal window. Image acquisition challenging due to respiratory motion. IMPRESSIONS  1. Left ventricular  ejection fraction, by estimation, is 35 to 40%. The left ventricle has moderately decreased function. The left ventricle demonstrates regional wall motion abnormalities (see scoring diagram/findings for description). The left ventricular internal cavity size was mildly dilated. Left ventricular diastolic parameters are consistent with Grade I diastolic dysfunction (impaired relaxation).  2. Right ventricular systolic function is normal. The right ventricular size is normal. Tricuspid regurgitation signal is inadequate for assessing PA pressure.  3. The mitral valve is grossly normal. Trivial mitral valve regurgitation. No evidence of mitral stenosis.  4. The aortic valve is tricuspid. There is mild calcification of the aortic  venlafaxine XR 150 MG 24 hr capsule Commonly known as: EFFEXOR-XR Take 150 mg by mouth daily.        Follow-up Information     Gauger, Hermenia Fiscal, NP. Schedule an appointment as soon as possible for a visit in 1 week(s).   Specialty: Internal Medicine Contact information: [address] [facility] - PRIMARY CARE [place] [address] [phone]         urology. Go  in 1 day(s).                  Subjective   Pt reports feeling well. His scrotum swelling has greatly improved as well as the pain and redness. Denies dysuria. Denies any weakness.  Has follow up with urology tomorrow.   All questions and concerns were addressed at time of discharge.  Objective  Blood pressure (!) 152/82, pulse 66, temperature 98.6 F (37 C), temperature source Oral, resp. rate 18, height 6\' 1"  (1.854 m), weight 101.4 kg, SpO2 96%.   General: Pt is alert, awake, not in acute distress Cardiovascular: RRR, S1/S2 +, no rubs, no gallops Respiratory: CTA bilaterally, no wheezing, no rhonchi Abdominal: Soft, NT, ND, bowel sounds + Extremities: no edema, no cyanosis GU: significant scrotal edema but much improved. Non-tender to palpation. Glans penis pale.   The results of significant diagnostics from this hospitalization (including imaging, microbiology, ancillary and laboratory) are listed below for reference.   Imaging studies: ECHOCARDIOGRAM COMPLETE  Result Date: 05/12/2023    ECHOCARDIOGRAM REPORT   Patient Name:   Paul Bass Date of Exam: 05/12/2023 Medical Rec #:  [id number]      Height:       71.0 in Accession #:    [id number]     Weight:       212.0 lb Date of Birth:  08-28-1945      BSA:          2.161 m Patient Age:    77 years       BP:           139/61 mmHg Patient Gender: M              HR:           55 bpm. Exam Location:  [facility] Procedure: 2D Echo and Intracardiac Opacification Agent Indications:     TIA G45.9  History:         Patient has prior history of Echocardiogram examinations, most                  recent 05/13/2012.  Sonographer:     Overton Mam RDCS, FASE Referring Phys:  [id number] Francoise Schaumann [place] Diagnosing Phys: Lennie Odor MD  Sonographer Comments: Technically difficult study due to poor echo windows, suboptimal apical window and no subcostal window. Image acquisition challenging due to respiratory motion. IMPRESSIONS  1. Left ventricular  ejection fraction, by estimation, is 35 to 40%. The left ventricle has moderately decreased function. The left ventricle demonstrates regional wall motion abnormalities (see scoring diagram/findings for description). The left ventricular internal cavity size was mildly dilated. Left ventricular diastolic parameters are consistent with Grade I diastolic dysfunction (impaired relaxation).  2. Right ventricular systolic function is normal. The right ventricular size is normal. Tricuspid regurgitation signal is inadequate for assessing PA pressure.  3. The mitral valve is grossly normal. Trivial mitral valve regurgitation. No evidence of mitral stenosis.  4. The aortic valve is tricuspid. There is mild calcification of the aortic  venlafaxine XR 150 MG 24 hr capsule Commonly known as: EFFEXOR-XR Take 150 mg by mouth daily.        Follow-up Information     Gauger, Hermenia Fiscal, NP. Schedule an appointment as soon as possible for a visit in 1 week(s).   Specialty: Internal Medicine Contact information: [address] [facility] - PRIMARY CARE [place] [address] [phone]         urology. Go  in 1 day(s).                  Subjective   Pt reports feeling well. His scrotum swelling has greatly improved as well as the pain and redness. Denies dysuria. Denies any weakness.  Has follow up with urology tomorrow.   All questions and concerns were addressed at time of discharge.  Objective  Blood pressure (!) 152/82, pulse 66, temperature 98.6 F (37 C), temperature source Oral, resp. rate 18, height 6\' 1"  (1.854 m), weight 101.4 kg, SpO2 96%.   General: Pt is alert, awake, not in acute distress Cardiovascular: RRR, S1/S2 +, no rubs, no gallops Respiratory: CTA bilaterally, no wheezing, no rhonchi Abdominal: Soft, NT, ND, bowel sounds + Extremities: no edema, no cyanosis GU: significant scrotal edema but much improved. Non-tender to palpation. Glans penis pale.   The results of significant diagnostics from this hospitalization (including imaging, microbiology, ancillary and laboratory) are listed below for reference.   Imaging studies: ECHOCARDIOGRAM COMPLETE  Result Date: 05/12/2023    ECHOCARDIOGRAM REPORT   Patient Name:   Paul Bass Date of Exam: 05/12/2023 Medical Rec #:  [id number]      Height:       71.0 in Accession #:    [id number]     Weight:       212.0 lb Date of Birth:  08-28-1945      BSA:          2.161 m Patient Age:    77 years       BP:           139/61 mmHg Patient Gender: M              HR:           55 bpm. Exam Location:  [facility] Procedure: 2D Echo and Intracardiac Opacification Agent Indications:     TIA G45.9  History:         Patient has prior history of Echocardiogram examinations, most                  recent 05/13/2012.  Sonographer:     Overton Mam RDCS, FASE Referring Phys:  [id number] Francoise Schaumann [place] Diagnosing Phys: Lennie Odor MD  Sonographer Comments: Technically difficult study due to poor echo windows, suboptimal apical window and no subcostal window. Image acquisition challenging due to respiratory motion. IMPRESSIONS  1. Left ventricular  ejection fraction, by estimation, is 35 to 40%. The left ventricle has moderately decreased function. The left ventricle demonstrates regional wall motion abnormalities (see scoring diagram/findings for description). The left ventricular internal cavity size was mildly dilated. Left ventricular diastolic parameters are consistent with Grade I diastolic dysfunction (impaired relaxation).  2. Right ventricular systolic function is normal. The right ventricular size is normal. Tricuspid regurgitation signal is inadequate for assessing PA pressure.  3. The mitral valve is grossly normal. Trivial mitral valve regurgitation. No evidence of mitral stenosis.  4. The aortic valve is tricuspid. There is mild calcification of the aortic

## 2023-05-15 NOTE — Consult Note (Signed)
[facility] Customer service manager [facility]) Accountable Care Organization (ACO) [facility] Liaison Note  05/15/2023  NIKOLAJ GERAGHTY 1946-04-22 [id number]  Location: [facility] RN Hospital Liaison screened the patient remotely at [facility].  Insurance: [facility]   OVA MEEGAN is a 77 y.o. male who is a Primary Care Patient of Gauger, Hermenia Fiscal, NP [facility]. The patient was screened for readmission hospitalization with noted low risk score for unplanned readmission risk with 1 IP in 6 months.  The patient was assessed for potential [facility]) Care Management service needs for post hospital transition for care coordination. Review of patient's electronic medical record reveals patient was admitted for Cellulitis. In review of chart pt will return on with hospice and arranged for OPPT, OT, ST at the [facility] campus location. No care management services presented at this time due to home with hospice services to address pt's ongoing needs.    [facility] Care Management/Population Health does not replace or interfere with any arrangements made by the Inpatient Transition of Care team.   For questions contact:   Elliot Cousin, RN, [facility] Liaison [place]   Population Health Office Hours MTWF  8:00 am-6:00 pm [phone] mobile [phone] [Office toll free line] Office Hours are M-F 8:30 - 5 pm [email or web address]

## 2023-05-16 DIAGNOSIS — N35014 Post-traumatic urethral stricture, male, unspecified: Secondary | ICD-10-CM | POA: Diagnosis not present

## 2023-05-16 DIAGNOSIS — N35011 Post-traumatic bulbous urethral stricture: Secondary | ICD-10-CM | POA: Diagnosis not present

## 2023-05-17 LAB — CULTURE, BLOOD (ROUTINE X 2)
Culture: NO GROWTH
Culture: NO GROWTH

## 2023-05-24 DIAGNOSIS — N390 Urinary tract infection, site not specified: Secondary | ICD-10-CM | POA: Diagnosis not present

## 2023-05-24 DIAGNOSIS — I5030 Unspecified diastolic (congestive) heart failure: Secondary | ICD-10-CM | POA: Diagnosis not present

## 2023-05-24 DIAGNOSIS — Z09 Encounter for follow-up examination after completed treatment for conditions other than malignant neoplasm: Secondary | ICD-10-CM | POA: Diagnosis not present

## 2023-05-24 DIAGNOSIS — I110 Hypertensive heart disease with heart failure: Secondary | ICD-10-CM | POA: Diagnosis not present

## 2023-05-24 DIAGNOSIS — Z87440 Personal history of urinary (tract) infections: Secondary | ICD-10-CM | POA: Diagnosis not present

## 2023-05-24 DIAGNOSIS — Z87891 Personal history of nicotine dependence: Secondary | ICD-10-CM | POA: Diagnosis not present

## 2023-05-24 DIAGNOSIS — I69351 Hemiplegia and hemiparesis following cerebral infarction affecting right dominant side: Secondary | ICD-10-CM | POA: Diagnosis not present

## 2023-05-24 DIAGNOSIS — R4701 Aphasia: Secondary | ICD-10-CM | POA: Diagnosis not present

## 2023-05-29 ENCOUNTER — Other Ambulatory Visit (INDEPENDENT_AMBULATORY_CARE_PROVIDER_SITE_OTHER): Payer: Self-pay | Admitting: Nurse Practitioner

## 2023-05-29 DIAGNOSIS — J410 Simple chronic bronchitis: Secondary | ICD-10-CM | POA: Diagnosis not present

## 2023-05-29 DIAGNOSIS — I2510 Atherosclerotic heart disease of native coronary artery without angina pectoris: Secondary | ICD-10-CM | POA: Diagnosis not present

## 2023-05-29 DIAGNOSIS — I5022 Chronic systolic (congestive) heart failure: Secondary | ICD-10-CM | POA: Diagnosis not present

## 2023-06-05 DIAGNOSIS — I5022 Chronic systolic (congestive) heart failure: Secondary | ICD-10-CM | POA: Diagnosis not present

## 2023-06-08 ENCOUNTER — Encounter: Payer: Medicare HMO | Admitting: Family

## 2023-06-14 ENCOUNTER — Encounter: Payer: Self-pay | Admitting: *Deleted

## 2023-06-15 ENCOUNTER — Other Ambulatory Visit (INDEPENDENT_AMBULATORY_CARE_PROVIDER_SITE_OTHER): Payer: Self-pay | Admitting: Nurse Practitioner

## 2023-06-15 DIAGNOSIS — I6523 Occlusion and stenosis of bilateral carotid arteries: Secondary | ICD-10-CM

## 2023-06-19 DIAGNOSIS — I5022 Chronic systolic (congestive) heart failure: Secondary | ICD-10-CM | POA: Diagnosis not present

## 2023-06-19 DIAGNOSIS — I2510 Atherosclerotic heart disease of native coronary artery without angina pectoris: Secondary | ICD-10-CM | POA: Diagnosis not present

## 2023-06-19 DIAGNOSIS — I6523 Occlusion and stenosis of bilateral carotid arteries: Secondary | ICD-10-CM | POA: Diagnosis not present

## 2023-06-19 DIAGNOSIS — I69319 Unspecified symptoms and signs involving cognitive functions following cerebral infarction: Secondary | ICD-10-CM | POA: Diagnosis not present

## 2023-06-21 ENCOUNTER — Ambulatory Visit: Payer: Medicare HMO | Attending: Nurse Practitioner

## 2023-06-21 DIAGNOSIS — R2681 Unsteadiness on feet: Secondary | ICD-10-CM | POA: Diagnosis not present

## 2023-06-21 DIAGNOSIS — M6281 Muscle weakness (generalized): Secondary | ICD-10-CM | POA: Diagnosis not present

## 2023-06-21 NOTE — Therapy (Signed)
OUTPATIENT PHYSICAL THERAPY BALANCE EVALUATION   Patient Name: Paul Bass MRN: [id number] DOB:1946-01-12, 77 y.o., male Today's Date: 06/21/2023  END OF SESSION:  PT End of Session - 06/21/23 1409     Visit Number 1    Number of Visits 17    Date for PT Re-Evaluation 08/16/23    Authorization Type eval: 06/21/23    PT Start Time 1317    PT Stop Time 1400    PT Time Calculation (min) 43 min    Equipment Utilized During Treatment Gait belt             Past Medical History:  Diagnosis Date   Accelerated junctional rhythm    Anemia    Anxiety    a.) Tx'd with BZO PRN   Arthritis    Bilateral carotid artery disease ([name])    BPH (benign prostatic hypertrophy)    CAD S/P percutaneous coronary angioplasty    a.) PCI in 2002 placing a RCA stent (unknown type). b.) LHC 12/07/2015: 5% ISR m-dRCA, 90% dRCA, 25% pRCA, 25% p-mLCx, 100% OM3, 95% oOM2-OM2, 75% m-dLAD; refer to CVTS. c.) 3v CABG 01/03/2016   CHF (congestive heart failure) ([name])    a.) TTE 05/12/2012: EF 50-55%; mild LA enlargement; G1DD. b.)  TTE 11/26/2015: EF 45%; mild BAE; triv PR, mild MR/TR; inferolateral HK; G1DD. c.)  TTE 11/07/2017: EF 35%; RV enlargement; BAE; inferior and inferolateral HK; triv PR; mild MR/TR. d.)  TTE 09/17/2019: EF 35%; mild LVH; triv MR/TR. e.)  TTE 08/11/2021: EF 50%; triv MR/TR; G1DD.   Cognitive deficit following cerebrovascular accident (CVA)    COPD (chronic obstructive pulmonary disease) ([name])    Cortical cataract    Depression    Dyspnea    GERD (gastroesophageal reflux disease)    History of 2019 novel coronavirus disease (COVID-19) 01/19/2021   History of acute inferior wall MI 2002   a.) PCI was performed placing a RCA stent (unknown type)   History of kidney stones    HTN (hypertension)    Hyperlipidemia    Hypothyroidism    Long term current use of antithrombotics/antiplatelets    a.) DAPT therapy (ASA + clopidogrel)   OSA (obstructive sleep apnea)    a.) does not  require nocturnal PAP therapy   PLMD (periodic limb movement disorder)    Postoperative atrial fibrillation ([name]) 01/03/2016   a.) following CABG procedure   Prostatitis    PVD (peripheral vascular disease) ([name])    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 b/) residual RIGHT sided weakness   T2DM (type 2 diabetes mellitus) ([name])    Tobacco abuse    Past Surgical History:  Procedure Laterality Date   CARDIAC CATHETERIZATION N/A 12/07/2015   Procedure: Left Heart Cath and Coronary Angiography;  Surgeon: Lamar Blinks, MD;  Location: [facility];  Service: Cardiovascular;  Laterality: N/A;   CAROTID ANGIOGRAPHY N/A 06/05/2022   Procedure: CAROTID ANGIOGRAPHY;  Surgeon: Annice Needy, MD;  Location: [facility];  Service: Cardiovascular;  Laterality: N/A;   CAROTID PTA/STENT INTERVENTION Left    HX: 2 stents   CATARACT EXTRACTION W/PHACO Left 03/24/2021   Procedure: CATARACT EXTRACTION PHACO AND INTRAOCULAR LENS PLACEMENT (IOC) LEFT DIABETIC 6.67 00:42.5;  Surgeon: Druscilla Brownie,  Chrissie Noa, MD;  Location: [facility];  Service: Ophthalmology;  Laterality: Left;   COLONOSCOPY WITH PROPOFOL N/A 02/18/2019   Procedure: COLONOSCOPY WITH PROPOFOL;  Surgeon: Midge Minium, MD;  Location: [facility];  Service: Endoscopy;  Laterality: N/A;   COLONOSCOPY WITH PROPOFOL N/A 12/22/2021   Procedure: COLONOSCOPY WITH PROPOFOL;  Surgeon: Midge Minium, MD;  Location: [facility] ENDOSCOPY;  Service: Endoscopy;  Laterality: N/A;   CORONARY ANGIOPLASTY WITH STENT PLACEMENT Left 2002   CORONARY ARTERY BYPASS GRAFT N/A 01/03/2016   Procedure: 3v CORONARY ARTERY BYPASS GRAFT (LIMA-LAD, SVG-PDA, SVG-OM2); Location: [facility]; Surgeon: Cain Sieve, MD   CYSTOSCOPY W/ URETERAL  STENT REMOVAL N/A 10/04/2021   Procedure: CYSTOSCOPY WITH PROSTATE FOREIGN BODY REMOVAL;  Surgeon: Riki Altes, MD;  Location: [facility];  Service: Urology;  Laterality: N/A;   CYSTOSCOPY WITH INSERTION OF UROLIFT N/A 04/29/2019   Procedure: CYSTOSCOPY WITH INSERTION OF UROLIFT;  Surgeon: Riki Altes, MD;  Location: [facility];  Service: Urology;  Laterality: N/A;   ESOPHAGOGASTRODUODENOSCOPY (EGD) WITH PROPOFOL N/A 09/11/2016   Procedure: ESOPHAGOGASTRODUODENOSCOPY (EGD) WITH PROPOFOL;  Surgeon: Midge Minium, MD;  Location: [facility];  Service: Endoscopy;  Laterality: N/A;   ESOPHAGOGASTRODUODENOSCOPY (EGD) WITH PROPOFOL N/A 10/17/2016   Procedure: ESOPHAGOGASTRODUODENOSCOPY (EGD) WITH PROPOFOL;  Surgeon: Midge Minium, MD;  Location: [facility];  Service: Endoscopy;  Laterality: N/A;   ESOPHAGOGASTRODUODENOSCOPY (EGD) WITH PROPOFOL N/A 11/13/2017   Procedure: ESOPHAGOGASTRODUODENOSCOPY (EGD) WITH PROPOFOL;  Surgeon: Midge Minium, MD;  Location: [facility];  Service: Endoscopy;  Laterality: N/A;   pins R lower leg Left    rotator cuff replaced Right    SAVORY DILATION N/A 10/17/2016   Procedure: SAVORY DILATION;  Surgeon: Midge Minium, MD;  Location: [facility]OS[facility];  Service: Endoscopy;  Laterality: N/A;   TRANSURETHRAL INCISION OF PROSTATE N/A 10/04/2021   Procedure: TRANSURETHRAL INCISION OF THE PROSTATE (TUIP);  Surgeon: Riki Altes, MD;  Location: [facility];  Service: Urology;  Laterality: N/A;   Patient Active Problem List   Diagnosis Date Noted   Acute cystitis without hematuria 05/15/2023   Pseudomonas infection 05/15/2023   Pseudomonas urinary tract infection 05/14/2023   Left testicular pain 05/13/2023   Generalized weakness 05/13/2023   Cellulitis of scrotum 05/13/2023   Cerebellar stroke, acute ([name]) 05/13/2023   Cellulitis 05/12/2023   Cellulitis of groin 05/12/2023   Carotid stenosis, symptomatic, with infarction ([name]) 06/05/2022   History of colonic  polyps    Anxiety state 11/17/2021   Cognitive deficit, post-stroke 11/17/2021   Ex-smoker 11/17/2021   Bradycardia 02/03/2021   Chronic systolic CHF (congestive heart failure), NYHA class 2 ([name]) 05/27/2020   Accelerated junctional rhythm 04/21/2019   Encounter for screening colonoscopy    Polyp of colon    CAD (coronary artery disease) 01/14/2019   Depression 01/14/2019   GERD (gastroesophageal reflux disease) 01/14/2019   Periodic limb movement disorder (PLMD) 01/14/2019   Sleep apnea 01/14/2019   Type 2 diabetes mellitus without complications ([name]) 01/14/2019   Stricture and stenosis of esophagus    Problems with swallowing and mastication    Food impaction of esophagus    H/O coronary artery bypass surgery 01/17/2016   Cerebrovascular accident (CVA) due to embolism ([name]) 01/03/2016   Hypothyroidism, unspecified 01/03/2016   HLD (hyperlipidemia) 01/03/2016   Stable angina ([name]) 11/29/2015   Benign essential hypertension 05/27/2015   Bilateral carotid artery stenosis 02/24/2015   Atherosclerotic peripheral vascular disease ([name]) 09/07/2014   Bladder calculus 08/12/2014   Primary osteoarthritis of left knee 03/10/2014  Elevated prostate specific antigen (PSA) 02/05/2013   Snoring 01/22/2013   Sleepiness 01/22/2013   Dysphagia 01/22/2013   Occlusion and stenosis of carotid artery without mention of cerebral infarction 01/22/2013   Cerebral thrombosis with cerebral infarction ([name]) 01/22/2013   Benign prostatic hyperplasia with incomplete bladder emptying 08/26/2012   Chronic prostatitis 08/26/2012   Family history of malignant neoplasm of prostate 08/26/2012   Incomplete emptying of bladder 08/26/2012   Dysphagia 05/14/2012   Global aphasia 05/14/2012   Stroke, acute, thrombotic ([name]) 05/12/2012   Symptomatic carotid artery stenosis 05/12/2012   HTN (hypertension) 05/12/2012   COPD (chronic obstructive pulmonary disease) ([name]) 05/12/2012   Acute respiratory failure ([name])  05/12/2012    PCP: Myrene Buddy, NP  REFERRING PROVIDER: Myrene Buddy, *   REFERRING DIAG: I63.10 (ICD-10-CM) - Cerebral infarction due to embolism of unspecified precerebral artery S06.9XAA (ICD-10-CM) - Unspecified intracranial injury with loss of consciousness status unknown, initial encounter R47.01 (ICD-10-CM) - Aphasia S01.90XA (ICD-10-CM) - Unspecified open wound of unspecified part of head, initial encounter  RATIONALE FOR EVALUATION AND TREATMENT: Rehabilitation  THERAPY DIAG: Unsteadiness on feet  Muscle weakness (generalized)  ONSET DATE: 05/12/23  FOLLOW-UP APPT SCHEDULED WITH REFERRING PROVIDER: Yes    SUBJECTIVE:                                                                                                                                                                                         SUBJECTIVE STATEMENT:   Severe imbalance with R sided weakness;   PERTINENT HISTORY:  Pt referred to PT for unsteadiness s/p CVA. He was hospitalized at [facility] on 05/12/23-05/15/23 for chills, weakness, and left-sided testicular pain for several days. Noted baseline history of CVA with right-sided deficits. This was acutely worsened w/ his other sx. Urinalysis mildly indicative of infection. Troponin 19-28. COVID flu and RSV negative. Lactate within normal limits. CT head- negative acute intracranial abnormality. Old left MCA infarct with left frontal encephalomalacia (R sided weakness). Atrophy, chronic microvascular disease. Brain MRI read as punctate acute or subacute infarct in the right superior cerebellum. No associated hemorrhage or mass effect. Scrotal ultrasound showed bilateral hydroceles, simple on the right and complex (multiseptated) left, bilateral varicoceles, epididymides are enlarged and heterogeneous in appearance bilaterally, with multiple epididymal cysts and/or spermatoceles, and multiple calcifications. No evidence of testicular torsion, or findings  suggestive of epididymo-orchitis.Initially treated with IV rocephin, flagyl, vancomycin as well as topical miconazole cream. Neurology and urology were consulted and was admitted for further wrkp/management. Neurology did not recommend any further neurological workup and he was asymptomatic. Additionally, patient was found to have significantly elevated blood pressures throughout his  stay and was not on home therapy. He was started on entresto given his CAD and HFpEF following permissive hypertensive period however he was taken off after his recent cardiology visit. Pt has had 3 falls since returning home from the hospital. He had a notable worsening of his balance with recent CVA. Recently finished speech therapy.   Pain: No Numbness/Tingling: No Focal Weakness: Yes, RUE/RLE weakness, mild R facial weakness Recent changes in overall health/medication: Yes Prior history of physical therapy for balance:  Yes, 2013 and L MCA infarct Dominant hand: right Imaging: Yes  Red flags: Negative for bowel/bladder changes, personal history of cancer, abdominal pain, chills/fever, night sweats, nausea, vomiting, unrelenting pain,  PRECAUTIONS: None  WEIGHT BEARING RESTRICTIONS: No  FALLS: Has patient fallen in last 6 months? Yes. 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   Patient Goals: Pt wants to be able to work in the yard, strengthen his R side, and improve his balance.    OBJECTIVE:   Patient Surveys  FOTO: 50, predicted improvement to 18    Cognition Patient is oriented to person, place, and time.  Recent memory is intact.  Remote memory is intact.  Attention span and concentration are intact.  Expressive aphasia noted; Patient's fund of knowledge is within normal limits for educational level.    Gross Musculoskeletal Assessment Tremor: None Bulk: Normal Tone: Normal  Posture: No gross abnormalities noted in standing or seated posture  AROM No gross deficits noted with functional activities  LE MMT: MMT (out of 5) Right  Left   Hip flexion 4+ 4+  Hip extension    Hip abduction (seated) 5 5  Hip adduction (seated) 5 5  Hip internal rotation 5 5  Hip external rotation 5 5  Knee flexion 5 5  Knee extension 5 5  Ankle dorsiflexion 5 5  Ankle plantarflexion Active Active  Ankle inversion    Ankle eversion    (* = pain; Blank rows = not tested)  Slight decrease in R shoulder flexion and R grip strength noted compared to left side  Sensation Grossly intact to light touch throughout bilateral LEs as determined by testing dermatomes L2-S2. Proprioception, stereognosis, and hot/cold testing deferred on this date.  Reflexes Deferred  Cranial Nerves Visual acuity and visual fields are intact  Extraocular muscles are intact  Facial sensation is intact bilaterally  Facial strength demonstrates slight droop in R corner of mouth  Hearing is normal as tested by gross conversation Palate elevates midline, normal phonation  Shoulder shrug strength is intact  Tongue protrudes midline  Coordination/Cerebellar Finger to Nose: Mildly dysmetric RUE Heel to Shin: Mildly dysmetric RLE Rapid alternating movements: WNL Finger Opposition: More difficult R hand Pronator Drift: Negative  Bed mobility: Deferred  Transfers: Assistive device utilized: None  Sit to stand: Complete Independence Stand to sit: Complete Independence Chair to chair: Complete Independence Floor:  Not tested  Curb:  Not  tested  Stairs: Level of Assistance: Modified independence Stair Negotiation Technique: Step to Pattern with Bilateral Rails Number of Stairs: 4  Height of Stairs: 6"  Comments: Utilized step-to pattern during descent only;  Gait: Gait pattern: decreased step length- Left and decreased stance time- Right Distance walked: 100 Assistive device utilized: None Level of assistance: Complete Independence Comments: Slight decrease stance time RLE, decreased self-selected speed  Functional Outcome Measures  Results Comments  BERG 49/56   DGI    FGA    TUG 9.5 seconds   5TSTS 11.8 seconds   6 Minute Walk Test    10 Meter Gait Speed    (Blank rows = not tested)   TODAY'S TREATMENT  Deferred  PATIENT EDUCATION:  Education details: Plan of care, importance of PT after CVA Person educated: Patient and Spouse Education method: Explanation Education comprehension: verbalized understanding   HOME EXERCISE PROGRAM:  None currently   ASSESSMENT:  CLINICAL IMPRESSION: Patient is a 77 y.o. male who was seen today for physical therapy evaluation and treatment for unsteadiness s/p CVA.   OBJECTIVE IMPAIRMENTS: Abnormal gait, decreased balance, decreased coordination, difficulty walking, and decreased strength.   ACTIVITY LIMITATIONS: carrying, lifting, bending, squatting, stairs, and transfers  PARTICIPATION LIMITATIONS: meal prep, cleaning, shopping, community activity, and yard work  PERSONAL FACTORS: Age and 3+ comorbidities: aphasia, CAD, carotid stenosis, prior CVA, CHF, and COPD  are also affecting patient's functional outcome.   REHAB POTENTIAL: Good  CLINICAL DECISION MAKING: Unstable/unpredictable  EVALUATION COMPLEXITY: High   GOALS: Goals reviewed with patient? No  SHORT TERM GOALS: Target date: 07/19/2023  Pt will be independent with HEP in order to improve strength and balance in order to decrease fall risk and improve function at home. Baseline:  Goal  status: INITIAL   LONG TERM GOALS: Target date: 08/16/2023  Pt will increase FOTO to at least 62 to demonstrate significant improvement in function at home related to balance  Baseline: 50 Goal status: INITIAL  2.  Pt will improve BERG by at least 3 points in order to demonstrate clinically significant improvement in balance.   Baseline: 06/21/23: 49/56 Goal status: INITIAL  3. Pt will improve DGI by at least 3 points in order to demonstrate clinically significant improvement in balance and decreased risk for falls.     Baseline: To be completed Goal status: INITIAL  4. Pt will increase by at least 86m (140ft) in order to demonstrate clinically significant improvement in cardiopulmonary endurance and community ambulation   Baseline: To be completed Goal status: INITIAL   PLAN: PT FREQUENCY: 2x/week  PT DURATION: 8 weeks  PLANNED INTERVENTIONS: Therapeutic exercises, Therapeutic activity, Neuromuscular re-education, Balance training, Gait training, Patient/Family education, Self Care, Joint mobilization, Joint manipulation, Vestibular training, Canalith repositioning, Orthotic/Fit training, DME instructions, Dry Needling, Electrical stimulation, Spinal manipulation, Spinal mobilization, Cryotherapy, Moist heat, Taping, Traction, Ultrasound, Ionotophoresis 4mg /ml Dexamethasone, Manual therapy, and Re-evaluation.  PLAN FOR NEXT SESSION: DGI, , initiate balance and strengthening exercises, issue HEP   Sharalyn Ink Juanmiguel Defelice PT, DPT, GCS  Nashay Brickley 06/21/2023, 2:10 PM

## 2023-06-22 DIAGNOSIS — I5040 Unspecified combined systolic (congestive) and diastolic (congestive) heart failure: Secondary | ICD-10-CM | POA: Diagnosis not present

## 2023-06-22 DIAGNOSIS — E039 Hypothyroidism, unspecified: Secondary | ICD-10-CM | POA: Diagnosis not present

## 2023-06-22 DIAGNOSIS — F418 Other specified anxiety disorders: Secondary | ICD-10-CM | POA: Diagnosis not present

## 2023-06-22 DIAGNOSIS — E782 Mixed hyperlipidemia: Secondary | ICD-10-CM | POA: Diagnosis not present

## 2023-06-22 DIAGNOSIS — I2510 Atherosclerotic heart disease of native coronary artery without angina pectoris: Secondary | ICD-10-CM | POA: Diagnosis not present

## 2023-06-22 DIAGNOSIS — I69319 Unspecified symptoms and signs involving cognitive functions following cerebral infarction: Secondary | ICD-10-CM | POA: Diagnosis not present

## 2023-06-22 DIAGNOSIS — E1151 Type 2 diabetes mellitus with diabetic peripheral angiopathy without gangrene: Secondary | ICD-10-CM | POA: Diagnosis not present

## 2023-06-22 DIAGNOSIS — J449 Chronic obstructive pulmonary disease, unspecified: Secondary | ICD-10-CM | POA: Diagnosis not present

## 2023-06-22 DIAGNOSIS — I110 Hypertensive heart disease with heart failure: Secondary | ICD-10-CM | POA: Diagnosis not present

## 2023-06-23 NOTE — Therapy (Signed)
OUTPATIENT PHYSICAL THERAPY BALANCE TREATMENT   Patient Name: Paul Bass MRN: [id number] DOB:[birth year], 77 y.o., male Today's Date: 06/25/2023  END OF SESSION:  PT End of Session - 06/25/23 1002     Visit Number 2    Number of Visits 17    Date for PT Re-Evaluation 08/16/23    Authorization Type eval: 06/21/23    PT Start Time 1015    PT Stop Time 1100    PT Time Calculation (min) 45 min    Equipment Utilized During Treatment Gait belt            Past Medical History:  Diagnosis Date   Accelerated junctional rhythm    Anemia    Anxiety    a.) Tx'd with BZO PRN   Arthritis    Bilateral carotid artery disease ([name])    BPH (benign prostatic hypertrophy)    CAD S/P percutaneous coronary angioplasty    a.) PCI in 2002 placing a RCA stent (unknown type). b.) LHC 12/07/2015: 5% ISR m-dRCA, 90% dRCA, 25% pRCA, 25% p-mLCx, 100% OM3, 95% oOM2-OM2, 75% m-dLAD; refer to CVTS. c.) 3v CABG 01/03/2016   CHF (congestive heart failure) ([name])    a.) TTE 05/12/2012: EF 50-55%; mild LA enlargement; G1DD. b.)  TTE 11/26/2015: EF 45%; mild BAE; triv PR, mild MR/TR; inferolateral HK; G1DD. c.)  TTE 11/07/2017: EF 35%; RV enlargement; BAE; inferior and inferolateral HK; triv PR; mild MR/TR. d.)  TTE 09/17/2019: EF 35%; mild LVH; triv MR/TR. e.)  TTE 08/11/2021: EF 50%; triv MR/TR; G1DD.   Cognitive deficit following cerebrovascular accident (CVA)    COPD (chronic obstructive pulmonary disease) ([name])    Cortical cataract    Depression    Dyspnea    GERD (gastroesophageal reflux disease)    History of 2019 novel coronavirus disease (COVID-19) 01/19/2021   History of acute inferior wall MI 2002   a.) PCI was performed placing a RCA stent (unknown type)   History of kidney stones    HTN (hypertension)    Hyperlipidemia    Hypothyroidism    Long term current use of antithrombotics/antiplatelets    a.) DAPT therapy (ASA + clopidogrel)   OSA (obstructive sleep apnea)    a.) does not  require nocturnal PAP therapy   PLMD (periodic limb movement disorder)    Postoperative atrial fibrillation ([name]) 01/03/2016   a.) following CABG procedure   Prostatitis    PVD (peripheral vascular disease) ([name])    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 b/) residual RIGHT sided weakness   T2DM (type 2 diabetes mellitus) ([name])    Tobacco abuse    Past Surgical History:  Procedure Laterality Date   CARDIAC CATHETERIZATION N/A 12/07/2015   Procedure: Left Heart Cath and Coronary Angiography;  Surgeon: Lamar Blinks, MD;  Location: [facility];  Service: Cardiovascular;  Laterality: N/A;   CAROTID ANGIOGRAPHY N/A 06/05/2022   Procedure: CAROTID ANGIOGRAPHY;  Surgeon: Annice Needy, MD;  Location: [facility];  Service: Cardiovascular;  Laterality: N/A;   CAROTID PTA/STENT INTERVENTION Left    HX: 2 stents   CATARACT EXTRACTION W/PHACO Left 03/24/2021   Procedure: CATARACT EXTRACTION PHACO AND INTRAOCULAR LENS PLACEMENT (IOC) LEFT DIABETIC 6.67 00:42.5;  Surgeon: Galen Manila,  MD;  Location: [facility];  Service: Ophthalmology;  Laterality: Left;   COLONOSCOPY WITH PROPOFOL N/A 02/18/2019   Procedure: COLONOSCOPY WITH PROPOFOL;  Surgeon: Midge Minium, MD;  Location: [facility];  Service: Endoscopy;  Laterality: N/A;   COLONOSCOPY WITH PROPOFOL N/A 12/22/2021   Procedure: COLONOSCOPY WITH PROPOFOL;  Surgeon: Midge Minium, MD;  Location: [facility] ENDOSCOPY;  Service: Endoscopy;  Laterality: N/A;   CORONARY ANGIOPLASTY WITH STENT PLACEMENT Left 2002   CORONARY ARTERY BYPASS GRAFT N/A 01/03/2016   Procedure: 3v CORONARY ARTERY BYPASS GRAFT (LIMA-LAD, SVG-PDA, SVG-OM2); Location: [facility]; Surgeon: Cain Sieve, MD   CYSTOSCOPY W/ URETERAL  STENT REMOVAL N/A 10/04/2021   Procedure: CYSTOSCOPY WITH PROSTATE FOREIGN BODY REMOVAL;  Surgeon: Riki Altes, MD;  Location: [facility];  Service: Urology;  Laterality: N/A;   CYSTOSCOPY WITH INSERTION OF UROLIFT N/A 04/29/2019   Procedure: CYSTOSCOPY WITH INSERTION OF UROLIFT;  Surgeon: Riki Altes, MD;  Location: [facility];  Service: Urology;  Laterality: N/A;   ESOPHAGOGASTRODUODENOSCOPY (EGD) WITH PROPOFOL N/A 09/11/2016   Procedure: ESOPHAGOGASTRODUODENOSCOPY (EGD) WITH PROPOFOL;  Surgeon: Midge Minium, MD;  Location: [facility];  Service: Endoscopy;  Laterality: N/A;   ESOPHAGOGASTRODUODENOSCOPY (EGD) WITH PROPOFOL N/A 10/17/2016   Procedure: ESOPHAGOGASTRODUODENOSCOPY (EGD) WITH PROPOFOL;  Surgeon: Midge Minium, MD;  Location: [facility];  Service: Endoscopy;  Laterality: N/A;   ESOPHAGOGASTRODUODENOSCOPY (EGD) WITH PROPOFOL N/A 11/13/2017   Procedure: ESOPHAGOGASTRODUODENOSCOPY (EGD) WITH PROPOFOL;  Surgeon: Midge Minium, MD;  Location: [facility];  Service: Endoscopy;  Laterality: N/A;   pins R lower leg Left    rotator cuff replaced Right    SAVORY DILATION N/A 10/17/2016   Procedure: SAVORY DILATION;  Surgeon: Midge Minium, MD;  Location: [facility]OS[facility];  Service: Endoscopy;  Laterality: N/A;   TRANSURETHRAL INCISION OF PROSTATE N/A 10/04/2021   Procedure: TRANSURETHRAL INCISION OF THE PROSTATE (TUIP);  Surgeon: Riki Altes, MD;  Location: [facility];  Service: Urology;  Laterality: N/A;   Patient Active Problem List   Diagnosis Date Noted   Acute cystitis without hematuria 05/15/2023   Pseudomonas infection 05/15/2023   Pseudomonas urinary tract infection 05/14/2023   Left testicular pain 05/13/2023   Generalized weakness 05/13/2023   Cellulitis of scrotum 05/13/2023   Cerebellar stroke, acute ([name]) 05/13/2023   Cellulitis 05/12/2023   Cellulitis of groin 05/12/2023   Carotid stenosis, symptomatic, with infarction ([name]) 06/05/2022   History of colonic  polyps    Anxiety state 11/17/2021   Cognitive deficit, post-stroke 11/17/2021   Ex-smoker 11/17/2021   Bradycardia 02/03/2021   Chronic systolic CHF (congestive heart failure), NYHA class 2 ([name]) 05/27/2020   Accelerated junctional rhythm 04/21/2019   Encounter for screening colonoscopy    Polyp of colon    CAD (coronary artery disease) 01/14/2019   Depression 01/14/2019   GERD (gastroesophageal reflux disease) 01/14/2019   Periodic limb movement disorder (PLMD) 01/14/2019   Sleep apnea 01/14/2019   Type 2 diabetes mellitus without complications ([name]) 01/14/2019   Stricture and stenosis of esophagus    Problems with swallowing and mastication    Food impaction of esophagus    H/O coronary artery bypass surgery 01/17/2016   Cerebrovascular accident (CVA) due to embolism ([name]) 01/03/2016   Hypothyroidism, unspecified 01/03/2016   HLD (hyperlipidemia) 01/03/2016   Stable angina ([name]) 11/29/2015   Benign essential hypertension 05/27/2015   Bilateral carotid artery stenosis 02/24/2015   Atherosclerotic peripheral vascular disease ([name]) 09/07/2014   Bladder calculus 08/12/2014   Primary osteoarthritis of left knee 03/10/2014   Elevated  prostate specific antigen (PSA) 02/05/2013   Snoring 01/22/2013   Sleepiness 01/22/2013   Dysphagia 01/22/2013   Occlusion and stenosis of carotid artery without mention of cerebral infarction 01/22/2013   Cerebral thrombosis with cerebral infarction ([name]) 01/22/2013   Benign prostatic hyperplasia with incomplete bladder emptying 08/26/2012   Chronic prostatitis 08/26/2012   Family history of malignant neoplasm of prostate 08/26/2012   Incomplete emptying of bladder 08/26/2012   Dysphagia 05/14/2012   Global aphasia 05/14/2012   Stroke, acute, thrombotic ([name]) 05/12/2012   Symptomatic carotid artery stenosis 05/12/2012   HTN (hypertension) 05/12/2012   COPD (chronic obstructive pulmonary disease) ([name]) 05/12/2012   Acute respiratory failure ([name])  05/12/2012    FROM INITIAL EVALUATION PCP: Myrene Buddy, NP  REFERRING PROVIDER: Myrene Buddy, *   REFERRING DIAG: I63.10 (ICD-10-CM) - Cerebral infarction due to embolism of unspecified precerebral artery S06.9XAA (ICD-10-CM) - Unspecified intracranial injury with loss of consciousness status unknown, initial encounter R47.01 (ICD-10-CM) - Aphasia S01.90XA (ICD-10-CM) - Unspecified open wound of unspecified part of head, initial encounter  RATIONALE FOR EVALUATION AND TREATMENT: Rehabilitation  THERAPY DIAG: Unsteadiness on feet  Muscle weakness (generalized)  ONSET DATE: 05/12/23  FOLLOW-UP APPT SCHEDULED WITH REFERRING PROVIDER: Yes    SUBJECTIVE:                                                                                                                                                                                         SUBJECTIVE STATEMENT:   Severe imbalance with R sided weakness;   PERTINENT HISTORY:  Pt referred to PT for unsteadiness s/p CVA. He was hospitalized at [facility] on 05/12/23-05/15/23 for chills, weakness, and left-sided testicular pain for several days. Noted baseline history of CVA with right-sided deficits. This was acutely worsened w/ his other sx. Urinalysis mildly indicative of infection. Troponin 19-28. COVID flu and RSV negative. Lactate within normal limits. CT head- negative acute intracranial abnormality. Old left MCA infarct with left frontal encephalomalacia (R sided weakness). Atrophy, chronic microvascular disease. Brain MRI read as punctate acute or subacute infarct in the right superior cerebellum. No associated hemorrhage or mass effect. Scrotal ultrasound showed bilateral hydroceles, simple on the right and complex (multiseptated) left, bilateral varicoceles, epididymides are enlarged and heterogeneous in appearance bilaterally, with multiple epididymal cysts and/or spermatoceles, and multiple calcifications. No evidence of  testicular torsion, or findings suggestive of epididymo-orchitis.Initially treated with IV rocephin, flagyl, vancomycin as well as topical miconazole cream. Neurology and urology were consulted and was admitted for further wrkp/management. Neurology did not recommend any further neurological workup and he was asymptomatic. Additionally, patient was found to have significantly elevated blood pressures  throughout his stay and was not on home therapy. He was started on entresto given his CAD and HFpEF following permissive hypertensive period however he was taken off after his recent cardiology visit. Pt has had 3 falls since returning home from the hospital. He had a notable worsening of his balance with recent CVA. Recently finished speech therapy.   Pain: No Numbness/Tingling: No Focal Weakness: Yes, RUE/RLE weakness, mild R facial weakness Recent changes in overall health/medication: Yes Prior history of physical therapy for balance:  Yes, 2013 and L MCA infarct Dominant hand: right Imaging: Yes  Red flags: Negative for bowel/bladder changes, personal history of cancer, abdominal pain, chills/fever, night sweats, nausea, vomiting, unrelenting pain,  PRECAUTIONS: None  WEIGHT BEARING RESTRICTIONS: No  FALLS: Has patient fallen in last 6 months? Yes. 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   Patient Goals: Pt wants to be able to work in the yard, strengthen his R side, and improve his balance.    OBJECTIVE:   Patient Surveys  FOTO: 50, predicted  improvement to 54   Cognition Patient is oriented to person, place, and time.  Recent memory is intact.  Remote memory is intact.  Attention span and concentration are intact.  Expressive aphasia noted; Patient's fund of knowledge is within normal limits for educational level.    Gross Musculoskeletal Assessment Tremor: None Bulk: Normal Tone: Normal  Posture: No gross abnormalities noted in standing or seated posture  AROM No gross deficits noted with functional activities  LE MMT: MMT (out of 5) Right  Left   Hip flexion 4+ 4+  Hip extension    Hip abduction (seated) 5 5  Hip adduction (seated) 5 5  Hip internal rotation 5 5  Hip external rotation 5 5  Knee flexion 5 5  Knee extension 5 5  Ankle dorsiflexion 5 5  Ankle plantarflexion Active Active  Ankle inversion    Ankle eversion    (* = pain; Blank rows = not tested)  Slight decrease in R shoulder flexion and R grip strength noted compared to left side  Sensation Grossly intact to light touch throughout bilateral LEs as determined by testing dermatomes L2-S2. Proprioception, stereognosis, and hot/cold testing deferred on this date.  Reflexes Deferred  Cranial Nerves Visual acuity and visual fields are intact  Extraocular muscles are intact  Facial sensation is intact bilaterally  Facial strength demonstrates slight droop in R corner of mouth  Hearing is normal as tested by gross conversation Palate elevates midline, normal phonation  Shoulder shrug strength is intact  Tongue protrudes midline  Coordination/Cerebellar Finger to Nose: Mildly dysmetric RUE Heel to Shin: Mildly dysmetric RLE Rapid alternating movements: WNL Finger Opposition: More difficult R hand Pronator Drift: Negative  Bed mobility: Deferred  Transfers: Assistive device utilized: None  Sit to stand: Complete Independence Stand to sit: Complete Independence Chair to chair: Complete Independence Floor:  Not tested  Curb:   Not tested  Stairs: Level of Assistance: Modified independence Stair Negotiation Technique: Step to Pattern with Bilateral Rails Number of Stairs: 4  Height of Stairs:  6"  Comments: Utilized step-to pattern during descent only;  Gait: Gait pattern: decreased step length- Left and decreased stance time- Right Distance walked: 100 Assistive device utilized: None Level of assistance: Complete Independence Comments: Slight decrease stance time RLE, decreased self-selected speed  Functional Outcome Measures  Results Comments  BERG 49/56   DGI    FGA    TUG 9.5 seconds   5TSTS 11.8 seconds   6 Minute Walk Test    10 Meter Gait Speed    (Blank rows = not tested)   TODAY'S TREATMENT    SUBJECTIVE: Pt reports that he is doing well today. No changes since the initial evaluation. No pain reported. No specific questions or concerns.    PAIN: None reported   Ther-ex  Vitals: Seated: BP: 164/70 mmHg, HR: 55 bpm, SpO2: 99% : 23' without assistive device. Occasional R foot shuffling.  Vitals afterwards: HR: 81 bpm, SpO2: 98%, "very short of breath"  Standing hip strengthening with 3# ankle weights: Hip flexion marches x 30s BLE; HS curls x 30s BLE; Hip abduction x 30s BLE; Hip extension x 30s BLE;  Standing heel raises with BUE support x 30s;  Seated LAQ with 3# ankle weights x 30s BLE; Seated clams with green tband x 30s BLE; Seated adductor ball squeezes x 30s BLE; Seated marches with green tband x 30s BLE; HEP provided and reviewed;   Neuromuscular Re-education  DGI: 14/24 Feet together eyes open/closed x 30s each; FT eyes open with horizontal and vertical head turns x 30s each; Semitandem balance alternating forward LE x 30s each; Tandem balance alternating forward LE x 30s each;   PATIENT EDUCATION:  Education details: Pt educated throughout session about proper posture and technique with exercises. Improved exercise technique, movement at target joints,  use of target muscles after min to mod verbal, visual, tactile cues. Balance exercises. HEP Person educated: Patient and Spouse Education method: Explanation Education comprehension: verbalized understanding   HOME EXERCISE PROGRAM:  Access Code: [email or web address] URL: [email or web address] Date: 06/25/2023 Prepared by: Ria Comment  Exercises - Seated March with Resistance  - 1 x daily - 7 x weekly - 3 sets - 30s hold - Seated Hip Adduction Isometrics with Ball  - 1 x daily - 7 x weekly - 3 sets - 30s hold - Seated Hip Abduction with Resistance  - 1 x daily - 7 x weekly - 3 sets - 30s hold - Seated Long Arc Quad  - 1 x daily - 7 x weekly - 3 sets - 30s hold   ASSESSMENT:  CLINICAL IMPRESSION: Initiated balance and strength exercises during session today with patient. Issued HEP and reviewed with patient. Plan to progress strengthening and balance exercises at future session. Will add balance exercises to HEP at future session when appropriate. Pt encouraged to follow-up as scheduled. Pt will benefit from PT services to address deficits in strength, balance, and mobility in order to return to full function at home and decrease his risk for falls.    OBJECTIVE IMPAIRMENTS: Abnormal gait, decreased balance, decreased coordination, difficulty walking, and decreased strength.   ACTIVITY LIMITATIONS: carrying, lifting, bending, squatting, stairs, and transfers  PARTICIPATION LIMITATIONS: meal prep, cleaning, shopping, community activity, and yard work  PERSONAL FACTORS: Age and 3+ comorbidities: aphasia, CAD, carotid stenosis, prior CVA, CHF, and COPD  are also affecting patient's functional outcome.   REHAB POTENTIAL: Good  CLINICAL DECISION MAKING: Unstable/unpredictable  EVALUATION COMPLEXITY: High   GOALS: Goals reviewed with patient? No  SHORT TERM  GOALS: Target date: 07/23/2023  Pt will be independent with HEP in order to improve strength and balance in order to  decrease fall risk and improve function at home. Baseline:  Goal status: INITIAL   LONG TERM GOALS: Target date: 08/20/2023  Pt will increase FOTO to at least 62 to demonstrate significant improvement in function at home related to balance  Baseline: 50 Goal status: INITIAL  2.  Pt will improve BERG by at least 3 points in order to demonstrate clinically significant improvement in balance.   Baseline: 06/21/23: 49/56 Goal status: INITIAL  3. Pt will improve DGI by at least 3 points in order to demonstrate clinically significant improvement in balance and decreased risk for falls.     Baseline: 14/24 Goal status: INITIAL  4. Pt will increase by at least 45m (164ft) in order to demonstrate clinically significant improvement in cardiopulmonary endurance and community ambulation   Baseline: 28' without assistive device. Occasional R foot shuffling Goal status: INITIAL   PLAN: PT FREQUENCY: 2x/week  PT DURATION: 8 weeks  PLANNED INTERVENTIONS: Therapeutic exercises, Therapeutic activity, Neuromuscular re-education, Balance training, Gait training, Patient/Family education, Self Care, Joint mobilization, Joint manipulation, Vestibular training, Canalith repositioning, Orthotic/Fit training, DME instructions, Dry Needling, Electrical stimulation, Spinal manipulation, Spinal mobilization, Cryotherapy, Moist heat, Taping, Traction, Ultrasound, Ionotophoresis 4mg /ml Dexamethasone, Manual therapy, and Re-evaluation.  PLAN FOR NEXT SESSION: Progress balance and strengthening exercises, modify/review HEP (add balance exercises) as necessary;   Sharalyn Ink Joan Avetisyan PT, DPT, GCS  Rochanda Harpham 06/25/2023, 10:58 AM

## 2023-06-25 ENCOUNTER — Ambulatory Visit: Payer: Medicare HMO

## 2023-06-25 DIAGNOSIS — M6281 Muscle weakness (generalized): Secondary | ICD-10-CM | POA: Diagnosis not present

## 2023-06-25 DIAGNOSIS — R2681 Unsteadiness on feet: Secondary | ICD-10-CM

## 2023-06-27 ENCOUNTER — Ambulatory Visit (INDEPENDENT_AMBULATORY_CARE_PROVIDER_SITE_OTHER): Payer: Medicare HMO | Admitting: Nurse Practitioner

## 2023-06-27 ENCOUNTER — Ambulatory Visit (INDEPENDENT_AMBULATORY_CARE_PROVIDER_SITE_OTHER): Payer: Medicare HMO

## 2023-06-27 ENCOUNTER — Encounter (INDEPENDENT_AMBULATORY_CARE_PROVIDER_SITE_OTHER): Payer: Self-pay | Admitting: Nurse Practitioner

## 2023-06-27 VITALS — BP 140/72 | HR 60 | Resp 16 | Wt 221.0 lb

## 2023-06-27 DIAGNOSIS — E119 Type 2 diabetes mellitus without complications: Secondary | ICD-10-CM | POA: Diagnosis not present

## 2023-06-27 DIAGNOSIS — I10 Essential (primary) hypertension: Secondary | ICD-10-CM

## 2023-06-27 DIAGNOSIS — I6523 Occlusion and stenosis of bilateral carotid arteries: Secondary | ICD-10-CM

## 2023-06-27 NOTE — Progress Notes (Signed)
Subjective:    Patient ID: Paul Bass, male    DOB: 09-Jan-1946, 77 y.o.   MRN: [id number] Chief Complaint  Patient presents with   Follow-up    6 month carotid follow up    The patient is seen for follow up evaluation of carotid stenosis. The carotid stenosis followed by ultrasound.  Patient had left ICA stent replaced on 06/19/2022 following a fracture from the left ICA stent office years ago  The patient denies amaurosis fugax.  He has previous history of stroke and still has some speech abnormalities but these are stable.  No new symptoms.  The patient is taking enteric-coated aspirin 81 mg daily.  There is no history of migraine headaches. There is no history of seizures.  No recent shortening of the patient's walking distance or new symptoms consistent with claudication.  No history of rest pain symptoms. No new ulcers or wounds of the lower extremities have occurred.  There is no history of DVT, PE or superficial thrombophlebitis. No documented recent episodes of angina or shortness of breath documented.    His duplex today shows stable 40 to 59% right ICA stenosis.  He has velocities by normal criteria that would fall in the 40 to 59% range for left carotid stenosis, but with a stent in place, this would just indicate less than 50% stenosis within the previously placed stent.    Review of Systems     Objective:   Physical Exam  BP (!) 140/72 (BP Location: Right Arm)   Pulse 60   Resp 16   Wt 221 lb (100.2 kg)   BMI 29.16 kg/m   Past Medical History:  Diagnosis Date   Accelerated junctional rhythm    Anemia    Anxiety    a.) Tx'd with BZO PRN   Arthritis    Bilateral carotid artery disease ([name])    BPH (benign prostatic hypertrophy)    CAD S/P percutaneous coronary angioplasty    a.) PCI in 2002 placing a RCA stent (unknown type). b.) LHC 12/07/2015: 5% ISR m-dRCA, 90% dRCA, 25% pRCA, 25% p-mLCx, 100% OM3, 95% oOM2-OM2, 75% m-dLAD; refer to CVTS. c.) 3v  CABG 01/03/2016   CHF (congestive heart failure) ([name])    a.) TTE 05/12/2012: EF 50-55%; mild LA enlargement; G1DD. b.)  TTE 11/26/2015: EF 45%; mild BAE; triv PR, mild MR/TR; inferolateral HK; G1DD. c.)  TTE 11/07/2017: EF 35%; RV enlargement; BAE; inferior and inferolateral HK; triv PR; mild MR/TR. d.)  TTE 09/17/2019: EF 35%; mild LVH; triv MR/TR. e.)  TTE 08/11/2021: EF 50%; triv MR/TR; G1DD.   Cognitive deficit following cerebrovascular accident (CVA)    COPD (chronic obstructive pulmonary disease) ([name])    Cortical cataract    Depression    Dyspnea    GERD (gastroesophageal reflux disease)    History of 2019 novel coronavirus disease (COVID-19) 01/19/2021   History of acute inferior wall MI 2002   a.) PCI was performed placing a RCA stent (unknown type)   History of kidney stones    HTN (hypertension)    Hyperlipidemia    Hypothyroidism    Long term current use of antithrombotics/antiplatelets    a.) DAPT therapy (ASA + clopidogrel)   OSA (obstructive sleep apnea)    a.) does not require nocturnal PAP therapy   PLMD (periodic limb movement disorder)    Postoperative atrial fibrillation ([name]) 01/03/2016   a.) following CABG procedure   Prostatitis    PVD (peripheral vascular disease) ([name])  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 b/) residual RIGHT sided weakness   T2DM (type 2 diabetes mellitus) ([name])    Tobacco abuse     Social History   Socioeconomic History   Marital status: Married    Spouse name: Bonita Quin   Number of children: 1   Years of education: HS   Highest education level: Not on file  Occupational History   Occupation: Retired    Comment: Part Time [employer]  Tobacco Use   Smoking status: Former    Current  packs/day: 0.00    Types: Cigarettes    Quit date: 05/20/2012    Years since quitting: 11.1   Smokeless tobacco: Never  Vaping Use   Vaping status: Never Used  Substance and Sexual Activity   Alcohol use: Not Currently    Comment: No alcohol since stroke   Drug use: Never   Sexual activity: Yes    Birth control/protection: None  Other Topics Concern   Not on file  Social History Narrative   Patient lives at home with his wife works at  [employer] has a 12 grade education with 1 child.   Patient quit smoking 05-11-2012 patient drinks alcoholic drinks he also drinks cafinted drinks daily.   Social Determinants of Health   Financial Resource Strain: Low Risk  (03/20/2023)   Received from [facility]   Overall Financial Resource Strain (CARDIA)    Difficulty of Paying Living Expenses: Not very hard  Food Insecurity: No Food Insecurity (05/13/2023)   Hunger Vital Sign    Worried About Running Out of Food in the Last Year: Never true    Ran Out of Food in the Last Year: Never true  Transportation Needs: No Transportation Needs (05/13/2023)   PRAPARE - Administrator, Civil Service (Medical): No    Lack of Transportation (Non-Medical): No  Physical Activity: Not on file  Stress: Not on file  Social Connections: Not on file  Intimate Partner Violence: Not At Risk (05/13/2023)   Humiliation, Afraid, Rape, and Kick questionnaire    Fear of Current or Ex-Partner: No    Emotionally Abused: No    Physically Abused: No    Sexually Abused: No    Past Surgical History:  Procedure Laterality Date   CARDIAC CATHETERIZATION N/A 12/07/2015   Procedure: Left Heart Cath and Coronary Angiography;  Surgeon: Lamar Blinks, MD;  Location: [facility] INV[facility]IVE CV [facility];  Service: Cardiovascular;  Laterality: N/A;   CAROTID ANGIOGRAPHY N/A 06/05/2022   Procedure: CAROTID ANGIOGRAPHY;  Surgeon: Annice Needy, MD;  Location: [facility];  Service: Cardiovascular;  Laterality:  N/A;   CAROTID PTA/STENT INTERVENTION Left    HX: 2 stents   CATARACT EXTRACTION W/PHACO Left 03/24/2021   Procedure: CATARACT EXTRACTION PHACO AND INTRAOCULAR LENS PLACEMENT (IOC) LEFT DIABETIC 6.67 00:42.5;  Surgeon: Galen Manila, MD;  Location: [facility] ORS;  Service: Ophthalmology;  Laterality: Left;   COLONOSCOPY WITH PROPOFOL N/A 02/18/2019   Procedure: COLONOSCOPY WITH PROPOFOL;  Surgeon: Midge Minium, MD;  Location: [facility] ENDOSCOPY;  Service: Endoscopy;  Laterality: N/A;   COLONOSCOPY WITH PROPOFOL N/A 12/22/2021   Procedure: COLONOSCOPY WITH PROPOFOL;  Surgeon: Midge Minium, MD;  Location: [facility] ENDOSCOPY;  Service: Endoscopy;  Laterality:  N/A;   CORONARY ANGIOPLASTY WITH STENT PLACEMENT Left 2002   CORONARY ARTERY BYPASS GRAFT N/A 01/03/2016   Procedure: 3v CORONARY ARTERY BYPASS GRAFT (LIMA-LAD, SVG-PDA, SVG-OM2); Location: [facility]; Surgeon: Cain Sieve, MD   CYSTOSCOPY W/ URETERAL STENT REMOVAL N/A 10/04/2021   Procedure: CYSTOSCOPY WITH PROSTATE FOREIGN BODY REMOVAL;  Surgeon: Riki Altes, MD;  Location: [facility] ORS;  Service: Urology;  Laterality: N/A;   CYSTOSCOPY WITH INSERTION OF UROLIFT N/A 04/29/2019   Procedure: CYSTOSCOPY WITH INSERTION OF UROLIFT;  Surgeon: Riki Altes, MD;  Location: [facility] ORS;  Service: Urology;  Laterality: N/A;   ESOPHAGOGASTRODUODENOSCOPY (EGD) WITH PROPOFOL N/A 09/11/2016   Procedure: ESOPHAGOGASTRODUODENOSCOPY (EGD) WITH PROPOFOL;  Surgeon: Midge Minium, MD;  Location: [facility] ENDOSCOPY;  Service: Endoscopy;  Laterality: N/A;   ESOPHAGOGASTRODUODENOSCOPY (EGD) WITH PROPOFOL N/A 10/17/2016   Procedure: ESOPHAGOGASTRODUODENOSCOPY (EGD) WITH PROPOFOL;  Surgeon: Midge Minium, MD;  Location: [facility] ENDOSCOPY;  Service: Endoscopy;  Laterality: N/A;   ESOPHAGOGASTRODUODENOSCOPY (EGD) WITH PROPOFOL N/A 11/13/2017   Procedure: ESOPHAGOGASTRODUODENOSCOPY (EGD) WITH PROPOFOL;  Surgeon: Midge Minium, MD;  Location: [facility] ENDOSCOPY;  Service: Endoscopy;   Laterality: N/A;   pins R lower leg Left    rotator cuff replaced Right    SAVORY DILATION N/A 10/17/2016   Procedure: SAVORY DILATION;  Surgeon: Midge Minium, MD;  Location: [facility] ENDOSCOPY;  Service: Endoscopy;  Laterality: N/A;   TRANSURETHRAL INCISION OF PROSTATE N/A 10/04/2021   Procedure: TRANSURETHRAL INCISION OF THE PROSTATE (TUIP);  Surgeon: Riki Altes, MD;  Location: [facility] ORS;  Service: Urology;  Laterality: N/A;    Family History  Problem Relation Age of Onset   Heart failure Father    Throat cancer Mother    Diabetes Brother     No Known Allergies     Latest Ref Rng & Units 05/15/2023    6:25 AM 05/14/2023    5:05 AM 05/12/2023   12:52 AM  CBC  WBC 4.0 - 10.5 K/uL 5.8  8.3  14.9   Hemoglobin 13.0 - 17.0 g/dL 95.6  21.3  08.6   Hematocrit 39.0 - 52.0 % 36.4  34.3  39.9   Platelets 150 - 400 K/uL 157  142  160       CMP     Component Value Date/Time   NA 134 (L) 05/15/2023 0625   NA 141 05/11/2012 1929   K 3.7 05/15/2023 0625   K 3.9 05/11/2012 1929   CL 102 05/15/2023 0625   CL 107 05/11/2012 1929   CO2 23 05/15/2023 0625   CO2 26 05/11/2012 1929   GLUCOSE 169 (H) 05/15/2023 0625   GLUCOSE 144 (H) 05/11/2012 1929   BUN 13 05/15/2023 0625   BUN 11 05/11/2012 1929   CREATININE 0.80 05/15/2023 0625   CREATININE 0.85 05/11/2012 1929   CALCIUM 8.0 (L) 05/15/2023 0625   CALCIUM 8.5 05/11/2012 1929   PROT 7.2 05/12/2023 0052   PROT 7.1 05/11/2012 1929   ALBUMIN 3.7 05/12/2023 0052   ALBUMIN 3.5 05/11/2012 1929   AST 16 05/12/2023 0052   AST 23 05/11/2012 1929   ALT 16 05/12/2023 0052   ALT 35 05/11/2012 1929   ALKPHOS 83 05/12/2023 0052   ALKPHOS 110 05/11/2012 1929   BILITOT 0.9 05/12/2023 0052   BILITOT 0.3 05/11/2012 1929   GFRNONAA >60 05/15/2023 0625   GFRNONAA >60 05/11/2012 1929     No results found.     Assessment & Plan:   1. Bilateral carotid artery stenosis His duplex today shows stable  40 to 59% right ICA stenosis.  He has  velocities by normal criteria that would fall in the 40 to 59% range for left carotid stenosis, but with a stent in place, this would just indicate less than 50% stenosis within the previously placed stent.  Continue current medical regimen.  Recheck in 12 months with duplex.    2. Benign essential hypertension Continue antihypertensive medications as already ordered, these medications have been reviewed and there are no changes at this time.  3. Type 2 diabetes mellitus without complication, unspecified whether long term insulin use ([name]) Continue hypoglycemic medications as already ordered, these medications have been reviewed and there are no changes at this time.  Hgb A1C to be monitored as already arranged by primary service   Current Outpatient Medications on File Prior to Visit  Medication Sig Dispense Refill   albuterol (VENTOLIN HFA) 108 (90 Base) MCG/ACT inhaler SMARTSIG:2 inhalation Via Inhaler Every 4 Hours PRN     aspirin EC (ASPIRIN LOW DOSE) 81 MG tablet TAKE 1 TABLET (81 MG TOTAL) BY MOUTH DAILY AT 6 (SIX) AM. SWALLOW WHOLE. 90 tablet 1   atorvastatin (LIPITOR) 80 MG tablet Take 80 mg by mouth daily.      buPROPion (WELLBUTRIN XL) 150 MG 24 hr tablet Take 150 mg by mouth at bedtime.     cetirizine (ZYRTEC) 10 MG tablet Take 10 mg by mouth as needed for allergies.     cholecalciferol (VITAMIN D3) 25 MCG (1000 UT) tablet Take 1,000 Units by mouth every evening.     clonazePAM (KLONOPIN) 0.5 MG tablet Take 0.5 mg by mouth 2 (two) times daily.     clopidogrel (PLAVIX) 75 MG tablet Take 75 mg by mouth daily.   1   Coenzyme Q10 (COQ10) 100 MG CAPS Take 100 mg by mouth at bedtime.     finasteride (PROSCAR) 5 MG tablet Take 5 mg by mouth daily.     fluticasone (FLONASE) 50 MCG/ACT nasal spray Place 2 sprays into both nostrils daily.     fluticasone-salmeterol (WIXELA INHUB) 500-50 MCG/ACT AEPB Inhale 2 puffs into the lungs in the morning and at bedtime.     gabapentin (NEURONTIN)  300 MG capsule Take 300 mg by mouth daily.     glucose blood test strip      levothyroxine (SYNTHROID, LEVOTHROID) 100 MCG tablet Take 100 mcg by mouth daily before breakfast.     metFORMIN (GLUCOPHAGE) 500 MG tablet Take 500 mg by mouth 2 (two) times daily.      metoprolol tartrate (LOPRESSOR) 25 MG tablet Take 12.5 mg by mouth 2 (two) times daily.     Multiple Vitamin (MULTIVITAMIN WITH MINERALS) TABS Take 1 tablet by mouth daily.     naproxen sodium (ALEVE) 220 MG tablet Take 220-440 mg by mouth 2 (two) times daily as needed (pain.).     pantoprazole (PROTONIX) 40 MG tablet TAKE 1 TABLET BY MOUTH EVERYDAY AT BEDTIME 90 tablet 3   psyllium (METAMUCIL SMOOTH TEXTURE) 28 % packet Take 1 packet by mouth daily.     RA KRILL OIL 500 MG CAPS Take 500 mg by mouth at bedtime.      spironolactone (ALDACTONE) 25 MG tablet Take 12.5 mg by mouth daily.     tamsulosin (FLOMAX) 0.4 MG CAPS capsule TAKE 1 CAPSULE BY MOUTH EVERY DAY 90 capsule 1   triamcinolone cream (KENALOG) 0.1 % Apply 1 Application topically 2 (two) times daily.     venlafaxine XR (EFFEXOR-XR) 150 MG 24  hr capsule Take 150 mg by mouth daily.     fluticasone-salmeterol (ADVAIR) 500-50 MCG/ACT AEPB Inhale into the lungs. (Patient not taking: Reported on 06/27/2023)     loratadine (CLARITIN) 10 MG tablet Take 10 mg by mouth daily. (Patient not taking: Reported on 06/27/2023)     sacubitril-valsartan (ENTRESTO) 24-26 MG Take 1 tablet by mouth 2 (two) times daily. (Patient not taking: Reported on 06/27/2023) 120 tablet 0   No current facility-administered medications on file prior to visit.    There are no Patient Instructions on file for this visit. No follow-ups on file.   Georgiana Spinner, NP

## 2023-07-01 NOTE — Therapy (Signed)
OUTPATIENT PHYSICAL THERAPY BALANCE TREATMENT   Patient Name: Paul Bass MRN: [id number] DOB:25-Apr-1946, 77 y.o., male Today's Date: 07/03/2023  END OF SESSION:  PT End of Session - 07/03/23 1359     Visit Number 3    Number of Visits 17    Date for PT Re-Evaluation 08/16/23    Authorization Type eval: 06/21/23    PT Start Time 1400    PT Stop Time 1445    PT Time Calculation (min) 45 min    Equipment Utilized During Treatment Gait belt            Past Medical History:  Diagnosis Date   Accelerated junctional rhythm    Anemia    Anxiety    a.) Tx'd with BZO PRN   Arthritis    Bilateral carotid artery disease ([name])    BPH (benign prostatic hypertrophy)    CAD S/P percutaneous coronary angioplasty    a.) PCI in 2002 placing a RCA stent (unknown type). b.) LHC 12/07/2015: 5% ISR m-dRCA, 90% dRCA, 25% pRCA, 25% p-mLCx, 100% OM3, 95% oOM2-OM2, 75% m-dLAD; refer to CVTS. c.) 3v CABG 01/03/2016   CHF (congestive heart failure) ([name])    a.) TTE 05/12/2012: EF 50-55%; mild LA enlargement; G1DD. b.)  TTE 11/26/2015: EF 45%; mild BAE; triv PR, mild MR/TR; inferolateral HK; G1DD. c.)  TTE 11/07/2017: EF 35%; RV enlargement; BAE; inferior and inferolateral HK; triv PR; mild MR/TR. d.)  TTE 09/17/2019: EF 35%; mild LVH; triv MR/TR. e.)  TTE 08/11/2021: EF 50%; triv MR/TR; G1DD.   Cognitive deficit following cerebrovascular accident (CVA)    COPD (chronic obstructive pulmonary disease) ([name])    Cortical cataract    Depression    Dyspnea    GERD (gastroesophageal reflux disease)    History of 2019 novel coronavirus disease (COVID-19) 01/19/2021   History of acute inferior wall MI 2002   a.) PCI was performed placing a RCA stent (unknown type)   History of kidney stones    HTN (hypertension)    Hyperlipidemia    Hypothyroidism    Long term current use of antithrombotics/antiplatelets    a.) DAPT therapy (ASA + clopidogrel)   OSA (obstructive sleep apnea)    a.) does not  require nocturnal PAP therapy   PLMD (periodic limb movement disorder)    Postoperative atrial fibrillation ([name]) 01/03/2016   a.) following CABG procedure   Prostatitis    PVD (peripheral vascular disease) ([name])    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 b/) residual RIGHT sided weakness   T2DM (type 2 diabetes mellitus) ([name])    Tobacco abuse    Past Surgical History:  Procedure Laterality Date   CARDIAC CATHETERIZATION N/A 12/07/2015   Procedure: Left Heart Cath and Coronary Angiography;  Surgeon: Lamar Blinks, MD;  Location: [facility];  Service: Cardiovascular;  Laterality: N/A;   CAROTID ANGIOGRAPHY N/A 06/05/2022   Procedure: CAROTID ANGIOGRAPHY;  Surgeon: Annice Needy, MD;  Location: [facility];  Service: Cardiovascular;  Laterality: N/A;   CAROTID PTA/STENT INTERVENTION Left    HX: 2 stents   CATARACT EXTRACTION W/PHACO Left 03/24/2021   Procedure: CATARACT EXTRACTION PHACO AND INTRAOCULAR LENS PLACEMENT (IOC) LEFT DIABETIC 6.67 00:42.5;  Surgeon: Galen Manila,  MD;  Location: [facility];  Service: Ophthalmology;  Laterality: Left;   COLONOSCOPY WITH PROPOFOL N/A 02/18/2019   Procedure: COLONOSCOPY WITH PROPOFOL;  Surgeon: Midge Minium, MD;  Location: [facility] ENDOSCOPY;  Service: Endoscopy;  Laterality: N/A;   COLONOSCOPY WITH PROPOFOL N/A 12/22/2021   Procedure: COLONOSCOPY WITH PROPOFOL;  Surgeon: Midge Minium, MD;  Location: [facility] ENDOSCOPY;  Service: Endoscopy;  Laterality: N/A;   CORONARY ANGIOPLASTY WITH STENT PLACEMENT Left 2002   CORONARY ARTERY BYPASS GRAFT N/A 01/03/2016   Procedure: 3v CORONARY ARTERY BYPASS GRAFT (LIMA-LAD, SVG-PDA, SVG-OM2); Location: [facility]; Surgeon: Cain Sieve, MD   CYSTOSCOPY W/ URETERAL  STENT REMOVAL N/A 10/04/2021   Procedure: CYSTOSCOPY WITH PROSTATE FOREIGN BODY REMOVAL;  Surgeon: Riki Altes, MD;  Location: [facility];  Service: Urology;  Laterality: N/A;   CYSTOSCOPY WITH INSERTION OF UROLIFT N/A 04/29/2019   Procedure: CYSTOSCOPY WITH INSERTION OF UROLIFT;  Surgeon: Riki Altes, MD;  Location: [facility];  Service: Urology;  Laterality: N/A;   ESOPHAGOGASTRODUODENOSCOPY (EGD) WITH PROPOFOL N/A 09/11/2016   Procedure: ESOPHAGOGASTRODUODENOSCOPY (EGD) WITH PROPOFOL;  Surgeon: Midge Minium, MD;  Location: [facility] ENDOSCOPY;  Service: Endoscopy;  Laterality: N/A;   ESOPHAGOGASTRODUODENOSCOPY (EGD) WITH PROPOFOL N/A 10/17/2016   Procedure: ESOPHAGOGASTRODUODENOSCOPY (EGD) WITH PROPOFOL;  Surgeon: Midge Minium, MD;  Location: [facility] ENDOS[facility];  Service: Endoscopy;  Laterality: N/A;   ESOPHAGOGASTRODUODENOSCOPY (EGD) WITH PROPOFOL N/A 11/13/2017   Procedure: ESOPHAGOGASTRODUODENOSCOPY (EGD) WITH PROPOFOL;  Surgeon: Midge Minium, MD;  Location: [facility] ENDOS[facility];  Service: Endoscopy;  Laterality: N/A;   pins R lower leg Left    rotator cuff replaced Right    SAVORY DILATION N/A 10/17/2016   Procedure: SAVORY DILATION;  Surgeon: Midge Minium, MD;  Location: [facility] ENDOSCOPY;  Service: Endoscopy;  Laterality: N/A;   TRANSURETHRAL INCISION OF PROSTATE N/A 10/04/2021   Procedure: TRANSURETHRAL INCISION OF THE PROSTATE (TUIP);  Surgeon: Riki Altes, MD;  Location: [facility];  Service: Urology;  Laterality: N/A;   Patient Active Problem List   Diagnosis Date Noted   Acute cystitis without hematuria 05/15/2023   Pseudomonas infection 05/15/2023   Pseudomonas urinary tract infection 05/14/2023   Left testicular pain 05/13/2023   Generalized weakness 05/13/2023   Cellulitis of scrotum 05/13/2023   Cerebellar stroke, acute ([name]) 05/13/2023   Cellulitis 05/12/2023   Cellulitis of groin 05/12/2023   Carotid stenosis, symptomatic, with infarction ([name]) 06/05/2022   History of colonic  polyps    Anxiety state 11/17/2021   Cognitive deficit, post-stroke 11/17/2021   Ex-smoker 11/17/2021   Bradycardia 02/03/2021   Chronic systolic CHF (congestive heart failure), NYHA class 2 ([name]) 05/27/2020   Accelerated junctional rhythm 04/21/2019   Encounter for screening colonoscopy    Polyp of colon    CAD (coronary artery disease) 01/14/2019   Depression 01/14/2019   GERD (gastroesophageal reflux disease) 01/14/2019   Periodic limb movement disorder (PLMD) 01/14/2019   Sleep apnea 01/14/2019   Type 2 diabetes mellitus without complications ([name]) 01/14/2019   Stricture and stenosis of esophagus    Problems with swallowing and mastication    Food impaction of esophagus    H/O coronary artery bypass surgery 01/17/2016   Cerebrovascular accident (CVA) due to embolism ([name]) 01/03/2016   Hypothyroidism, unspecified 01/03/2016   HLD (hyperlipidemia) 01/03/2016   Stable angina ([name]) 11/29/2015   Benign essential hypertension 05/27/2015   Bilateral carotid artery stenosis 02/24/2015   Atherosclerotic peripheral vascular disease ([name]) 09/07/2014   Bladder calculus 08/12/2014   Primary osteoarthritis of left knee 03/10/2014   Elevated  prostate specific antigen (PSA) 02/05/2013   Snoring 01/22/2013   Sleepiness 01/22/2013   Dysphagia 01/22/2013   Occlusion and stenosis of carotid artery without mention of cerebral infarction 01/22/2013   Cerebral thrombosis with cerebral infarction ([name]) 01/22/2013   Benign prostatic hyperplasia with incomplete bladder emptying 08/26/2012   Chronic prostatitis 08/26/2012   Family history of malignant neoplasm of prostate 08/26/2012   Incomplete emptying of bladder 08/26/2012   Dysphagia 05/14/2012   Global aphasia 05/14/2012   Stroke, acute, thrombotic ([name]) 05/12/2012   Symptomatic carotid artery stenosis 05/12/2012   HTN (hypertension) 05/12/2012   COPD (chronic obstructive pulmonary disease) ([name]) 05/12/2012   Acute respiratory failure ([name])  05/12/2012    FROM INITIAL EVALUATION PCP: Myrene Buddy, NP  REFERRING PROVIDER: Myrene Buddy, *   REFERRING DIAG: I63.10 (ICD-10-CM) - Cerebral infarction due to embolism of unspecified precerebral artery S06.9XAA (ICD-10-CM) - Unspecified intracranial injury with loss of consciousness status unknown, initial encounter R47.01 (ICD-10-CM) - Aphasia S01.90XA (ICD-10-CM) - Unspecified open wound of unspecified part of head, initial encounter  RATIONALE FOR EVALUATION AND TREATMENT: Rehabilitation  THERAPY DIAG: Unsteadiness on feet  Muscle weakness (generalized)  ONSET DATE: 05/12/23  FOLLOW-UP APPT SCHEDULED WITH REFERRING PROVIDER: Yes    SUBJECTIVE:                                                                                                                                                                                         SUBJECTIVE STATEMENT:   Severe imbalance with R sided weakness;   PERTINENT HISTORY:  Pt referred to PT for unsteadiness s/p CVA. He was hospitalized at [facility] on 05/12/23-05/15/23 for chills, weakness, and left-sided testicular pain for several days. Noted baseline history of CVA with right-sided deficits. This was acutely worsened w/ his other sx. Urinalysis mildly indicative of infection. Troponin 19-28. COVID flu and RSV negative. Lactate within normal limits. CT head- negative acute intracranial abnormality. Old left MCA infarct with left frontal encephalomalacia (R sided weakness). Atrophy, chronic microvascular disease. Brain MRI read as punctate acute or subacute infarct in the right superior cerebellum. No associated hemorrhage or mass effect. Scrotal ultrasound showed bilateral hydroceles, simple on the right and complex (multiseptated) left, bilateral varicoceles, epididymides are enlarged and heterogeneous in appearance bilaterally, with multiple epididymal cysts and/or spermatoceles, and multiple calcifications. No evidence of  testicular torsion, or findings suggestive of epididymo-orchitis.Initially treated with IV rocephin, flagyl, vancomycin as well as topical miconazole cream. Neurology and urology were consulted and was admitted for further wrkp/management. Neurology did not recommend any further neurological workup and he was asymptomatic. Additionally, patient was found to have significantly elevated blood pressures  throughout his stay and was not on home therapy. He was started on entresto given his CAD and HFpEF following permissive hypertensive period however he was taken off after his recent cardiology visit. Pt has had 3 falls since returning home from the hospital. He had a notable worsening of his balance with recent CVA. Recently finished speech therapy.   Pain: No Numbness/Tingling: No Focal Weakness: Yes, RUE/RLE weakness, mild R facial weakness Recent changes in overall health/medication: Yes Prior history of physical therapy for balance:  Yes, 2013 and L MCA infarct Dominant hand: right Imaging: Yes  Red flags: Negative for bowel/bladder changes, personal history of cancer, abdominal pain, chills/fever, night sweats, nausea, vomiting, unrelenting pain,  PRECAUTIONS: None  WEIGHT BEARING RESTRICTIONS: No  FALLS: Has patient fallen in last 6 months? Yes. 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   Patient Goals: Pt wants to be able to work in the yard, strengthen his R side, and improve his balance.    OBJECTIVE:   Patient Surveys  FOTO: 50, predicted  improvement to 63   Cognition Patient is oriented to person, place, and time.  Recent memory is intact.  Remote memory is intact.  Attention span and concentration are intact.  Expressive aphasia noted; Patient's fund of knowledge is within normal limits for educational level.    Gross Musculoskeletal Assessment Tremor: None Bulk: Normal Tone: Normal  Posture: No gross abnormalities noted in standing or seated posture  AROM No gross deficits noted with functional activities  LE MMT: MMT (out of 5) Right  Left   Hip flexion 4+ 4+  Hip extension    Hip abduction (seated) 5 5  Hip adduction (seated) 5 5  Hip internal rotation 5 5  Hip external rotation 5 5  Knee flexion 5 5  Knee extension 5 5  Ankle dorsiflexion 5 5  Ankle plantarflexion Active Active  Ankle inversion    Ankle eversion    (* = pain; Blank rows = not tested)  Slight decrease in R shoulder flexion and R grip strength noted compared to left side  Sensation Grossly intact to light touch throughout bilateral LEs as determined by testing dermatomes L2-S2. Proprioception, stereognosis, and hot/cold testing deferred on this date.  Reflexes Deferred  Cranial Nerves Visual acuity and visual fields are intact  Extraocular muscles are intact  Facial sensation is intact bilaterally  Facial strength demonstrates slight droop in R corner of mouth  Hearing is normal as tested by gross conversation Palate elevates midline, normal phonation  Shoulder shrug strength is intact  Tongue protrudes midline  Coordination/Cerebellar Finger to Nose: Mildly dysmetric RUE Heel to Shin: Mildly dysmetric RLE Rapid alternating movements: WNL Finger Opposition: More difficult R hand Pronator Drift: Negative  Bed mobility: Deferred  Transfers: Assistive device utilized: None  Sit to stand: Complete Independence Stand to sit: Complete Independence Chair to chair: Complete Independence Floor:  Not tested  Curb:   Not tested  Stairs: Level of Assistance: Modified independence Stair Negotiation Technique: Step to Pattern with Bilateral Rails Number of Stairs: 4  Height of Stairs:  6"  Comments: Utilized step-to pattern during descent only;  Gait: Gait pattern: decreased step length- Left and decreased stance time- Right Distance walked: 100 Assistive device utilized: None Level of assistance: Complete Independence Comments: Slight decrease stance time RLE, decreased self-selected speed  Functional Outcome Measures  Results Comments  BERG 49/56   DGI    FGA    TUG 9.5 seconds   5TSTS 11.8 seconds   6 Minute Walk Test    10 Meter Gait Speed    (Blank rows = not tested)   TODAY'S TREATMENT    SUBJECTIVE: Pt reports that he is doing well today. No changes since the last therapy session. No pain reported. No specific questions or concerns.    PAIN: None reported   Ther-ex  NuStep L1-4 x 10 minutes for warm-up during interval history;  Standing hip strengthening with 4# ankle weights: Hip flexion marches x 30s BLE; HS curls x 30s BLE; Hip abduction x 30s BLE; Hip extension x 30s BLE;  Standing heel raises with BUE support x 30s; Seated LAQ with 4# ankle weights x 30s BLE;   Neuromuscular Re-education  Tandem balance alternating forward LE x 30s each; Tandem gait in // bars without UE support x multiple lengths; Airex feet together (FT) ball passes around body to therapist with return pass on opposite side x multiple bouts on each side;  Blaze pod Dynamic reaching on Airex pad Activity Setting:  Random Number of Pods:  6 Cycles/Sets:  2 Duration (Time or Hit Count):  1 minute First set performed with feet apart and second set performed with feet together;   PATIENT EDUCATION:  Education details: Pt educated throughout session about proper posture and technique with exercises. Improved exercise technique, movement at target joints, use of target muscles after min to mod  verbal, visual, tactile cues. Balance exercises. HEP Person educated: Patient and Spouse Education method: Explanation Education comprehension: verbalized understanding   HOME EXERCISE PROGRAM:  Access Code: [email or web address] URL: [email or web address] Date: 07/03/2023 Prepared by: Ria Comment  Exercises - Seated March with Resistance  - 1 x daily - 7 x weekly - 3 sets - 30s hold - Seated Hip Adduction Isometrics with Ball  - 1 x daily - 7 x weekly - 3 sets - 30s hold - Seated Hip Abduction with Resistance  - 1 x daily - 7 x weekly - 3 sets - 30s hold - Seated Long Arc Quad  - 1 x daily - 7 x weekly - 3 sets - 30s hold - Mini Squat with Counter Support  - 1 x daily - 7 x weekly - 3 sets - 30s hold - Heel Raises with Counter Support  - 1 x daily - 7 x weekly - 3 sets - 30s hold   ASSESSMENT:  CLINICAL IMPRESSION: Progressed balance and strength exercises during session today with patient. Progressed HEP and reviewed with patient. Plan to progress strengthening and balance exercises at future session. Will add balance exercises to HEP at future session when appropriate. Pt encouraged to follow-up as scheduled. Pt will benefit from PT services to address deficits in strength, balance, and mobility in order to return to full function at home and decrease his risk for falls.    OBJECTIVE IMPAIRMENTS: Abnormal gait, decreased balance, decreased coordination, difficulty walking, and decreased strength.   ACTIVITY LIMITATIONS: carrying, lifting, bending, squatting, stairs, and transfers  PARTICIPATION LIMITATIONS: meal prep, cleaning, shopping, community activity, and yard work  PERSONAL FACTORS: Age and 3+ comorbidities: aphasia, CAD,  carotid stenosis, prior CVA, CHF, and COPD  are also affecting patient's functional outcome.   REHAB POTENTIAL: Good  CLINICAL DECISION MAKING: Unstable/unpredictable  EVALUATION COMPLEXITY: High   GOALS: Goals reviewed with patient?  No  SHORT TERM GOALS: Target date: 07/31/2023  Pt will be independent with HEP in order to improve strength and balance in order to decrease fall risk and improve function at home. Baseline:  Goal status: INITIAL   LONG TERM GOALS: Target date: 08/28/2023  Pt will increase FOTO to at least 62 to demonstrate significant improvement in function at home related to balance  Baseline: 50 Goal status: INITIAL  2.  Pt will improve BERG by at least 3 points in order to demonstrate clinically significant improvement in balance.   Baseline: 06/21/23: 49/56 Goal status: INITIAL  3. Pt will improve DGI by at least 3 points in order to demonstrate clinically significant improvement in balance and decreased risk for falls.     Baseline: 14/24 Goal status: INITIAL  4. Pt will increase by at least 35m (181ft) in order to demonstrate clinically significant improvement in cardiopulmonary endurance and community ambulation   Baseline: 2' without assistive device. Occasional R foot shuffling Goal status: INITIAL   PLAN: PT FREQUENCY: 2x/week  PT DURATION: 8 weeks  PLANNED INTERVENTIONS: Therapeutic exercises, Therapeutic activity, Neuromuscular re-education, Balance training, Gait training, Patient/Family education, Self Care, Joint mobilization, Joint manipulation, Vestibular training, Canalith repositioning, Orthotic/Fit training, DME instructions, Dry Needling, Electrical stimulation, Spinal manipulation, Spinal mobilization, Cryotherapy, Moist heat, Taping, Traction, Ultrasound, Ionotophoresis 4mg /ml Dexamethasone, Manual therapy, and Re-evaluation.  PLAN FOR NEXT SESSION: Progress balance and strengthening exercises, modify/review HEP (add balance exercises) as necessary;   Sharalyn Ink Shayona Hibbitts PT, DPT, GCS  Jathniel Smeltzer 07/03/2023, 5:03 PM

## 2023-07-03 ENCOUNTER — Ambulatory Visit: Payer: Medicare HMO | Attending: Nurse Practitioner

## 2023-07-03 DIAGNOSIS — M6281 Muscle weakness (generalized): Secondary | ICD-10-CM

## 2023-07-03 DIAGNOSIS — R2681 Unsteadiness on feet: Secondary | ICD-10-CM

## 2023-07-04 NOTE — Therapy (Signed)
OUTPATIENT PHYSICAL THERAPY BALANCE TREATMENT   Patient Name: Paul Bass MRN: [id number] DOB:05-06-1946, 77 y.o., male Today's Date: 07/05/2023  END OF SESSION:  PT End of Session - 07/05/23 1351     Visit Number 4    Number of Visits 17    Date for PT Re-Evaluation 08/16/23    Authorization Type eval: 06/21/23    PT Start Time 1400    PT Stop Time 1445    PT Time Calculation (min) 45 min    Equipment Utilized During Treatment Gait belt    Activity Tolerance Patient tolerated treatment well            Past Medical History:  Diagnosis Date   Accelerated junctional rhythm    Anemia    Anxiety    a.) Tx'd with BZO PRN   Arthritis    Bilateral carotid artery disease ([name])    BPH (benign prostatic hypertrophy)    CAD S/P percutaneous coronary angioplasty    a.) PCI in 2002 placing a RCA stent (unknown type). b.) LHC 12/07/2015: 5% ISR m-dRCA, 90% dRCA, 25% pRCA, 25% p-mLCx, 100% OM3, 95% oOM2-OM2, 75% m-dLAD; refer to CVTS. c.) 3v CABG 01/03/2016   CHF (congestive heart failure) ([name])    a.) TTE 05/12/2012: EF 50-55%; mild LA enlargement; G1DD. b.)  TTE 11/26/2015: EF 45%; mild BAE; triv PR, mild MR/TR; inferolateral HK; G1DD. c.)  TTE 11/07/2017: EF 35%; RV enlargement; BAE; inferior and inferolateral HK; triv PR; mild MR/TR. d.)  TTE 09/17/2019: EF 35%; mild LVH; triv MR/TR. e.)  TTE 08/11/2021: EF 50%; triv MR/TR; G1DD.   Cognitive deficit following cerebrovascular accident (CVA)    COPD (chronic obstructive pulmonary disease) ([name])    Cortical cataract    Depression    Dyspnea    GERD (gastroesophageal reflux disease)    History of 2019 novel coronavirus disease (COVID-19) 01/19/2021   History of acute inferior wall MI 2002   a.) PCI was performed placing a RCA stent (unknown type)   History of kidney stones    HTN (hypertension)    Hyperlipidemia    Hypothyroidism    Long term current use of antithrombotics/antiplatelets    a.) DAPT therapy (ASA + clopidogrel)    OSA (obstructive sleep apnea)    a.) does not require nocturnal PAP therapy   PLMD (periodic limb movement disorder)    Postoperative atrial fibrillation ([name]) 01/03/2016   a.) following CABG procedure   Prostatitis    PVD (peripheral vascular disease) ([name])    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 b/) residual RIGHT sided weakness   T2DM (type 2 diabetes mellitus) ([name])    Tobacco abuse    Past Surgical History:  Procedure Laterality Date   CARDIAC CATHETERIZATION N/A 12/07/2015   Procedure: Left Heart Cath and Coronary Angiography;  Surgeon: Lamar Blinks, MD;  Location: [facility];  Service: Cardiovascular;  Laterality: N/A;   CAROTID ANGIOGRAPHY N/A 06/05/2022   Procedure: CAROTID ANGIOGRAPHY;  Surgeon: Annice Needy, MD;  Location: [facility];  Service: Cardiovascular;  Laterality: N/A;   CAROTID PTA/STENT INTERVENTION Left    HX: 2 stents   CATARACT EXTRACTION W/PHACO Left 03/24/2021   Procedure: CATARACT EXTRACTION PHACO AND INTRAOCULAR LENS PLACEMENT (  IOC) LEFT DIABETIC 6.67 00:42.5;  Surgeon: Galen Manila, MD;  Location: [facility];  Service: Ophthalmology;  Laterality: Left;   COLONOSCOPY WITH PROPOFOL N/A 02/18/2019   Procedure: COLONOSCOPY WITH PROPOFOL;  Surgeon: Midge Minium, MD;  Location: [facility] ENDOS[facility];  Service: Endoscopy;  Laterality: N/A;   COLONOSCOPY WITH PROPOFOL N/A 12/22/2021   Procedure: COLONOSCOPY WITH PROPOFOL;  Surgeon: Midge Minium, MD;  Location: [facility] ENDOSCOPY;  Service: Endoscopy;  Laterality: N/A;   CORONARY ANGIOPLASTY WITH STENT PLACEMENT Left 2002   CORONARY ARTERY BYPASS GRAFT N/A 01/03/2016   Procedure: 3v CORONARY ARTERY BYPASS GRAFT (LIMA-LAD, SVG-PDA, SVG-OM2); Location: [facility];  Surgeon: Cain Sieve, MD   CYSTOSCOPY W/ URETERAL STENT REMOVAL N/A 10/04/2021   Procedure: CYSTOSCOPY WITH PROSTATE FOREIGN BODY REMOVAL;  Surgeon: Riki Altes, MD;  Location: [facility];  Service: Urology;  Laterality: N/A;   CYSTOSCOPY WITH INSERTION OF UROLIFT N/A 04/29/2019   Procedure: CYSTOSCOPY WITH INSERTION OF UROLIFT;  Surgeon: Riki Altes, MD;  Location: [facility];  Service: Urology;  Laterality: N/A;   ESOPHAGOGASTRODUODENOSCOPY (EGD) WITH PROPOFOL N/A 09/11/2016   Procedure: ESOPHAGOGASTRODUODENOSCOPY (EGD) WITH PROPOFOL;  Surgeon: Midge Minium, MD;  Location: [facility];  Service: Endoscopy;  Laterality: N/A;   ESOPHAGOGASTRODUODENOSCOPY (EGD) WITH PROPOFOL N/A 10/17/2016   Procedure: ESOPHAGOGASTRODUODENOSCOPY (EGD) WITH PROPOFOL;  Surgeon: Midge Minium, MD;  Location: [facility];  Service: Endoscopy;  Laterality: N/A;   ESOPHAGOGASTRODUODENOSCOPY (EGD) WITH PROPOFOL N/A 11/13/2017   Procedure: ESOPHAGOGASTRODUODENOSCOPY (EGD) WITH PROPOFOL;  Surgeon: Midge Minium, MD;  Location: [facility];  Service: Endoscopy;  Laterality: N/A;   pins R lower leg Left    rotator cuff replaced Right    SAVORY DILATION N/A 10/17/2016   Procedure: SAVORY DILATION;  Surgeon: Midge Minium, MD;  Location: [facility];  Service: Endoscopy;  Laterality: N/A;   TRANSURETHRAL INCISION OF PROSTATE N/A 10/04/2021   Procedure: TRANSURETHRAL INCISION OF THE PROSTATE (TUIP);  Surgeon: Riki Altes, MD;  Location: [facility];  Service: Urology;  Laterality: N/A;   Patient Active Problem List   Diagnosis Date Noted   Acute cystitis without hematuria 05/15/2023   Pseudomonas infection 05/15/2023   Pseudomonas urinary tract infection 05/14/2023   Left testicular pain 05/13/2023   Generalized weakness 05/13/2023   Cellulitis of scrotum 05/13/2023   Cerebellar stroke, acute ([name]) 05/13/2023   Cellulitis 05/12/2023   Cellulitis of groin 05/12/2023   Carotid stenosis, symptomatic,  with infarction ([name]) 06/05/2022   History of colonic polyps    Anxiety state 11/17/2021   Cognitive deficit, post-stroke 11/17/2021   Ex-smoker 11/17/2021   Bradycardia 02/03/2021   Chronic systolic CHF (congestive heart failure), NYHA class 2 ([name]) 05/27/2020   Accelerated junctional rhythm 04/21/2019   Encounter for screening colonoscopy    Polyp of colon    CAD (coronary artery disease) 01/14/2019   Depression 01/14/2019   GERD (gastroesophageal reflux disease) 01/14/2019   Periodic limb movement disorder (PLMD) 01/14/2019   Sleep apnea 01/14/2019   Type 2 diabetes mellitus without complications ([name]) 01/14/2019   Stricture and stenosis of esophagus    Problems with swallowing and mastication    Food impaction of esophagus    H/O coronary artery bypass surgery 01/17/2016   Cerebrovascular accident (CVA) due to embolism ([name]) 01/03/2016   Hypothyroidism, unspecified 01/03/2016   HLD (hyperlipidemia) 01/03/2016   Stable angina ([name]) 11/29/2015   Benign essential hypertension 05/27/2015   Bilateral carotid artery stenosis 02/24/2015   Atherosclerotic peripheral vascular disease ([name]) 09/07/2014   Bladder calculus 08/12/2014  Primary osteoarthritis of left knee 03/10/2014   Elevated prostate specific antigen (PSA) 02/05/2013   Snoring 01/22/2013   Sleepiness 01/22/2013   Dysphagia 01/22/2013   Occlusion and stenosis of carotid artery without mention of cerebral infarction 01/22/2013   Cerebral thrombosis with cerebral infarction ([name]) 01/22/2013   Benign prostatic hyperplasia with incomplete bladder emptying 08/26/2012   Chronic prostatitis 08/26/2012   Family history of malignant neoplasm of prostate 08/26/2012   Incomplete emptying of bladder 08/26/2012   Dysphagia 05/14/2012   Global aphasia 05/14/2012   Stroke, acute, thrombotic ([name]) 05/12/2012   Symptomatic carotid artery stenosis 05/12/2012   HTN (hypertension) 05/12/2012   COPD (chronic obstructive pulmonary  disease) ([name]) 05/12/2012   Acute respiratory failure ([name]) 05/12/2012    FROM INITIAL EVALUATION PCP: Myrene Buddy, NP  REFERRING PROVIDER: Myrene Buddy, *   REFERRING DIAG: I63.10 (ICD-10-CM) - Cerebral infarction due to embolism of unspecified precerebral artery S06.9XAA (ICD-10-CM) - Unspecified intracranial injury with loss of consciousness status unknown, initial encounter R47.01 (ICD-10-CM) - Aphasia S01.90XA (ICD-10-CM) - Unspecified open wound of unspecified part of head, initial encounter  RATIONALE FOR EVALUATION AND TREATMENT: Rehabilitation  THERAPY DIAG: Unsteadiness on feet  Muscle weakness (generalized)  ONSET DATE: 05/12/23  FOLLOW-UP APPT SCHEDULED WITH REFERRING PROVIDER: Yes    SUBJECTIVE:                                                                                                                                                                                         SUBJECTIVE STATEMENT:   Severe imbalance with R sided weakness;   PERTINENT HISTORY:  Pt referred to PT for unsteadiness s/p CVA. He was hospitalized at [facility] on 05/12/23-05/15/23 for chills, weakness, and left-sided testicular pain for several days. Noted baseline history of CVA with right-sided deficits. This was acutely worsened w/ his other sx. Urinalysis mildly indicative of infection. Troponin 19-28. COVID flu and RSV negative. Lactate within normal limits. CT head- negative acute intracranial abnormality. Old left MCA infarct with left frontal encephalomalacia (R sided weakness). Atrophy, chronic microvascular disease. Brain MRI read as punctate acute or subacute infarct in the right superior cerebellum. No associated hemorrhage or mass effect. Scrotal ultrasound showed bilateral hydroceles, simple on the right and complex (multiseptated) left, bilateral varicoceles, epididymides are enlarged and heterogeneous in appearance bilaterally, with multiple epididymal cysts and/or  spermatoceles, and multiple calcifications. No evidence of testicular torsion, or findings suggestive of epididymo-orchitis.Initially treated with IV rocephin, flagyl, vancomycin as well as topical miconazole cream. Neurology and urology were consulted and was admitted for further wrkp/management. Neurology did not recommend any further neurological workup and he was asymptomatic. Additionally,  patient was found to have significantly elevated blood pressures throughout his stay and was not on home therapy. He was started on entresto given his CAD and HFpEF following permissive hypertensive period however he was taken off after his recent cardiology visit. Pt has had 3 falls since returning home from the hospital. He had a notable worsening of his balance with recent CVA. Recently finished speech therapy.   Pain: No Numbness/Tingling: No Focal Weakness: Yes, RUE/RLE weakness, mild R facial weakness Recent changes in overall health/medication: Yes Prior history of physical therapy for balance:  Yes, 2013 and L MCA infarct Dominant hand: right Imaging: Yes  Red flags: Negative for bowel/bladder changes, personal history of cancer, abdominal pain, chills/fever, night sweats, nausea, vomiting, unrelenting pain,  PRECAUTIONS: None  WEIGHT BEARING RESTRICTIONS: No  FALLS: Has patient fallen in last 6 months? Yes. 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   Patient Goals: Pt wants to be able to work in the yard, strengthen his R side, and improve his balance.     OBJECTIVE:   Patient Surveys  FOTO: 50, predicted improvement to 65   Cognition Patient is oriented to person, place, and time.  Recent memory is intact.  Remote memory is intact.  Attention span and concentration are intact.  Expressive aphasia noted; Patient's fund of knowledge is within normal limits for educational level.    Gross Musculoskeletal Assessment Tremor: None Bulk: Normal Tone: Normal  Posture: No gross abnormalities noted in standing or seated posture  AROM No gross deficits noted with functional activities  LE MMT: MMT (out of 5) Right  Left   Hip flexion 4+ 4+  Hip extension    Hip abduction (seated) 5 5  Hip adduction (seated) 5 5  Hip internal rotation 5 5  Hip external rotation 5 5  Knee flexion 5 5  Knee extension 5 5  Ankle dorsiflexion 5 5  Ankle plantarflexion Active Active  Ankle inversion    Ankle eversion    (* = pain; Blank rows = not tested)  Slight decrease in R shoulder flexion and R grip strength noted compared to left side  Sensation Grossly intact to light touch throughout bilateral LEs as determined by testing dermatomes L2-S2. Proprioception, stereognosis, and hot/cold testing deferred on this date.  Reflexes Deferred  Cranial Nerves Visual acuity and visual fields are intact  Extraocular muscles are intact  Facial sensation is intact bilaterally  Facial strength demonstrates slight droop in R corner of mouth  Hearing is normal as tested by gross conversation Palate elevates midline, normal phonation  Shoulder shrug strength is intact  Tongue protrudes midline  Coordination/Cerebellar Finger to Nose: Mildly dysmetric RUE Heel to Shin: Mildly dysmetric RLE Rapid alternating movements: WNL Finger Opposition: More difficult R hand Pronator Drift: Negative  Bed mobility: Deferred  Transfers: Assistive device utilized: None  Sit to stand: Complete Independence Stand to sit: Complete Independence Chair to  chair: Complete Independence Floor:  Not tested  Curb:  Not tested  Stairs: Level of Assistance: Modified independence Stair Negotiation Technique: Step to Pattern with Bilateral  Rails Number of Stairs: 4  Height of Stairs: 6"  Comments: Utilized step-to pattern during descent only;  Gait: Gait pattern: decreased step length- Left and decreased stance time- Right Distance walked: 100 Assistive device utilized: None Level of assistance: Complete Independence Comments: Slight decrease stance time RLE, decreased self-selected speed  Functional Outcome Measures  Results Comments  BERG 49/56   DGI    FGA    TUG 9.5 seconds   5TSTS 11.8 seconds   6 Minute Walk Test    10 Meter Gait Speed    (Blank rows = not tested)   TODAY'S TREATMENT    SUBJECTIVE: Pt reports that he is doing well today. No changes since the last therapy session. No pain reported. No specific questions or concerns.    PAIN: None reported   Ther-ex  NuStep L1-4 x 10 minutes for warm-up and BLE strengthening during interval history, therapist adjusting resistance and monitoring fatigue to ensure proper difficulty (5 minutes unbilled); 6" forward step-ups without UE support x 10 BLE; 6" lateral step-downs x 10 BLE; Nautilus resisted gait 40# forward, backward, R lateral, and L lateral x 3 each direction; Sit to stand with overhead 8# med ball press 2 x 10;   Neuromuscular Re-education  12" step taps without UE support x 10 BLE; Forward high knee marching in // bars with contralateral hand to knee x 4 lengths (motor planning very difficult for patient); Forward gait in hallway with horizontal and vertical head turns x 70' each;    Not performed: Standing hip strengthening with 4# ankle weights: Hip flexion marches x 30s BLE; HS curls x 30s BLE; Hip abduction x 30s BLE; Hip extension x 30s BLE; Standing heel raises with BUE support x 30s; Seated LAQ with 4# ankle weights x 30s BLE; Tandem  balance alternating forward LE x 30s each; Tandem gait in // bars without UE support x multiple lengths; Airex feet together (FT) ball passes around body to therapist with return pass on opposite side x multiple bouts on each side;   PATIENT EDUCATION:  Education details: Pt educated throughout session about proper posture and technique with exercises. Improved exercise technique, movement at target joints, use of target muscles after min to mod verbal, visual, tactile cues. Balance exercises. Person educated: Patient and Spouse Education method: Explanation Education comprehension: verbalized understanding   HOME EXERCISE PROGRAM:  Access Code: [email or web address] URL: [email or web address] Date: 07/03/2023 Prepared by: Ria Comment  Exercises - Seated March with Resistance  - 1 x daily - 7 x weekly - 3 sets - 30s hold - Seated Hip Adduction Isometrics with Ball  - 1 x daily - 7 x weekly - 3 sets - 30s hold - Seated Hip Abduction with Resistance  - 1 x daily - 7 x weekly - 3 sets - 30s hold - Seated Long Arc Quad  - 1 x daily - 7 x weekly - 3 sets - 30s hold - Mini Squat with Counter Support  - 1 x daily - 7 x weekly - 3 sets - 30s hold - Heel Raises with Counter Support  - 1 x daily - 7 x weekly - 3 sets - 30s hold   ASSESSMENT:  CLINICAL IMPRESSION: Progressed balance and strength exercises during session today with patient. Notable motor planning issues with more complex tasks during session today. Plan to progress strengthening and balance exercises at future session. Will add balance exercises to HEP at future session when appropriate. No HEP updates today. Pt encouraged to  follow-up as scheduled. Pt will benefit from PT services to address deficits in strength, balance, and mobility in order to return to full function at home and decrease his risk for falls.    OBJECTIVE IMPAIRMENTS: Abnormal gait, decreased balance, decreased coordination, difficulty walking, and decreased  strength.   ACTIVITY LIMITATIONS: carrying, lifting, bending, squatting, stairs, and transfers  PARTICIPATION LIMITATIONS: meal prep, cleaning, shopping, community activity, and yard work  PERSONAL FACTORS: Age and 3+ comorbidities: aphasia, CAD, carotid stenosis, prior CVA, CHF, and COPD  are also affecting patient's functional outcome.   REHAB POTENTIAL: Good  CLINICAL DECISION MAKING: Unstable/unpredictable  EVALUATION COMPLEXITY: High   GOALS: Goals reviewed with patient? No  SHORT TERM GOALS: Target date: 08/02/2023  Pt will be independent with HEP in order to improve strength and balance in order to decrease fall risk and improve function at home. Baseline:  Goal status: INITIAL   LONG TERM GOALS: Target date: 08/30/2023  Pt will increase FOTO to at least 62 to demonstrate significant improvement in function at home related to balance  Baseline: 50 Goal status: INITIAL  2.  Pt will improve BERG by at least 3 points in order to demonstrate clinically significant improvement in balance.   Baseline: 06/21/23: 49/56 Goal status: INITIAL  3. Pt will improve DGI by at least 3 points in order to demonstrate clinically significant improvement in balance and decreased risk for falls.     Baseline: 14/24 Goal status: INITIAL  4. Pt will increase by at least 53m (14ft) in order to demonstrate clinically significant improvement in cardiopulmonary endurance and community ambulation   Baseline: 53' without assistive device. Occasional R foot shuffling Goal status: INITIAL   PLAN: PT FREQUENCY: 2x/week  PT DURATION: 8 weeks  PLANNED INTERVENTIONS: Therapeutic exercises, Therapeutic activity, Neuromuscular re-education, Balance training, Gait training, Patient/Family education, Self Care, Joint mobilization, Joint manipulation, Vestibular training, Canalith repositioning, Orthotic/Fit training, DME instructions, Dry Needling, Electrical stimulation, Spinal manipulation,  Spinal mobilization, Cryotherapy, Moist heat, Taping, Traction, Ultrasound, Ionotophoresis 4mg /ml Dexamethasone, Manual therapy, and Re-evaluation.  PLAN FOR NEXT SESSION: Progress balance and strengthening exercises, modify/review HEP (add balance exercises) as necessary;   Sharalyn Ink Caisen Mangas PT, DPT, GCS  Blessyn [place] 07/05/2023, 2:56 PM

## 2023-07-05 ENCOUNTER — Ambulatory Visit: Payer: Medicare HMO

## 2023-07-05 DIAGNOSIS — R2681 Unsteadiness on feet: Secondary | ICD-10-CM

## 2023-07-05 DIAGNOSIS — M6281 Muscle weakness (generalized): Secondary | ICD-10-CM | POA: Diagnosis not present

## 2023-07-09 NOTE — Therapy (Signed)
OUTPATIENT PHYSICAL THERAPY BALANCE TREATMENT   Patient Name: Paul Bass MRN: [id number] DOB:[birth year], 77 y.o., male Today's Date: 07/09/2023  END OF SESSION:   Past Medical History:  Diagnosis Date   Accelerated junctional rhythm    Anemia    Anxiety    a.) Tx'd with BZO PRN   Arthritis    Bilateral carotid artery disease ([name])    BPH (benign prostatic hypertrophy)    CAD S/P percutaneous coronary angioplasty    a.) PCI in 2002 placing a RCA stent (unknown type). b.) LHC 12/07/2015: 5% ISR m-dRCA, 90% dRCA, 25% pRCA, 25% p-mLCx, 100% OM3, 95% oOM2-OM2, 75% m-dLAD; refer to CVTS. c.) 3v CABG 01/03/2016   CHF (congestive heart failure) ([name])    a.) TTE 05/12/2012: EF 50-55%; mild LA enlargement; G1DD. b.)  TTE 11/26/2015: EF 45%; mild BAE; triv PR, mild MR/TR; inferolateral HK; G1DD. c.)  TTE 11/07/2017: EF 35%; RV enlargement; BAE; inferior and inferolateral HK; triv PR; mild MR/TR. d.)  TTE 09/17/2019: EF 35%; mild LVH; triv MR/TR. e.)  TTE 08/11/2021: EF 50%; triv MR/TR; G1DD.   Cognitive deficit following cerebrovascular accident (CVA)    COPD (chronic obstructive pulmonary disease) ([name])    Cortical cataract    Depression    Dyspnea    GERD (gastroesophageal reflux disease)    History of 2019 novel coronavirus disease (COVID-19) 01/19/2021   History of acute inferior wall MI 2002   a.) PCI was performed placing a RCA stent (unknown type)   History of kidney stones    HTN (hypertension)    Hyperlipidemia    Hypothyroidism    Long term current use of antithrombotics/antiplatelets    a.) DAPT therapy (ASA + clopidogrel)   OSA (obstructive sleep apnea)    a.) does not require nocturnal PAP therapy   PLMD (periodic limb movement disorder)    Postoperative atrial fibrillation ([name]) 01/03/2016   a.) following CABG procedure   Prostatitis    PVD (peripheral vascular disease) ([name])    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 b/) residual RIGHT sided weakness   T2DM (type 2 diabetes mellitus) ([name])    Tobacco abuse    Past Surgical History:  Procedure Laterality Date   CARDIAC CATHETERIZATION N/A 12/07/2015   Procedure: Left Heart Cath and Coronary Angiography;  Surgeon: Lamar Blinks, MD;  Location: [facility];  Service: Cardiovascular;  Laterality: N/A;   CAROTID ANGIOGRAPHY N/A 06/05/2022   Procedure: CAROTID ANGIOGRAPHY;  Surgeon: Annice Needy, MD;  Location: [facility];  Service: Cardiovascular;  Laterality: N/A;   CAROTID PTA/STENT INTERVENTION Left    HX: 2 stents   CATARACT EXTRACTION W/PHACO Left 03/24/2021   Procedure: CATARACT EXTRACTION PHACO AND INTRAOCULAR LENS PLACEMENT (IOC) LEFT DIABETIC 6.67 00:42.5;  Surgeon: Galen Manila, MD;  Location: [facility];  Service: Ophthalmology;  Laterality: Left;   COLONOSCOPY WITH PROPOFOL N/A 02/18/2019   Procedure: COLONOSCOPY WITH PROPOFOL;  Surgeon: Midge Minium, MD;  Location: [facility] ENDOSCOPY;  Service: Endoscopy;  Laterality: N/A;   COLONOSCOPY WITH PROPOFOL N/A 12/22/2021   Procedure: COLONOSCOPY WITH PROPOFOL;  Surgeon: Midge Minium, MD;  Location: [facility] ENDOSCOPY;  Service: Endoscopy;  Laterality: N/A;   CORONARY ANGIOPLASTY WITH STENT PLACEMENT Left 2002  CORONARY ARTERY BYPASS GRAFT N/A 01/03/2016   Procedure: 3v CORONARY ARTERY BYPASS GRAFT (LIMA-LAD, SVG-PDA, SVG-OM2); Location: [facility]; Surgeon: Cain Sieve, MD   CYSTOSCOPY W/ URETERAL STENT REMOVAL N/A 10/04/2021   Procedure: CYSTOSCOPY WITH PROSTATE FOREIGN BODY REMOVAL;  Surgeon: Riki Altes, MD;  Location: [facility];  Service: Urology;  Laterality: N/A;   CYSTOSCOPY WITH INSERTION OF UROLIFT N/A 04/29/2019   Procedure: CYSTOSCOPY WITH INSERTION OF UROLIFT;  Surgeon: Riki Altes, MD;  Location: [facility];  Service: Urology;  Laterality: N/A;   ESOPHAGOGASTRODUODENOSCOPY (EGD) WITH PROPOFOL N/A 09/11/2016   Procedure: ESOPHAGOGASTRODUODENOSCOPY (EGD) WITH PROPOFOL;  Surgeon: Midge Minium, MD;  Location: [facility] ENDOSCOPY;  Service: Endoscopy;  Laterality: N/A;   ESOPHAGOGASTRODUODENOSCOPY (EGD) WITH PROPOFOL N/A 10/17/2016   Procedure: ESOPHAGOGASTRODUODENOSCOPY (EGD) WITH PROPOFOL;  Surgeon: Midge Minium, MD;  Location: [facility] ENDOSCOPY;  Service: Endoscopy;  Laterality: N/A;   ESOPHAGOGASTRODUODENOSCOPY (EGD) WITH PROPOFOL N/A 11/13/2017   Procedure: ESOPHAGOGASTRODUODENOSCOPY (EGD) WITH PROPOFOL;  Surgeon: Midge Minium, MD;  Location: [facility] ENDOSCOPY;  Service: Endoscopy;  Laterality: N/A;   pins R lower leg Left    rotator cuff replaced Right    SAVORY DILATION N/A 10/17/2016   Procedure: SAVORY DILATION;  Surgeon: Midge Minium, MD;  Location: [facility] ENDOSCOPY;  Service: Endoscopy;  Laterality: N/A;   TRANSURETHRAL INCISION OF PROSTATE N/A 10/04/2021   Procedure: TRANSURETHRAL INCISION OF THE PROSTATE (TUIP);  Surgeon: Riki Altes, MD;  Location: [facility];  Service: Urology;  Laterality: N/A;   Patient Active Problem List   Diagnosis Date Noted   Acute cystitis without hematuria 05/15/2023   Pseudomonas infection 05/15/2023   Pseudomonas urinary tract infection 05/14/2023   Left testicular pain 05/13/2023   Generalized weakness 05/13/2023   Cellulitis of scrotum 05/13/2023   Cerebellar stroke, acute ([name]) 05/13/2023   Cellulitis 05/12/2023   Cellulitis of groin 05/12/2023   Carotid stenosis, symptomatic, with infarction ([name]) 06/05/2022   History of colonic polyps    Anxiety state 11/17/2021   Cognitive deficit, post-stroke 11/17/2021   Ex-smoker 11/17/2021   Bradycardia 02/03/2021   Chronic systolic CHF (congestive heart failure), NYHA class 2 ([name]) 05/27/2020   Accelerated junctional rhythm 04/21/2019   Encounter for screening colonoscopy    Polyp of  colon    CAD (coronary artery disease) 01/14/2019   Depression 01/14/2019   GERD (gastroesophageal reflux disease) 01/14/2019   Periodic limb movement disorder (PLMD) 01/14/2019   Sleep apnea 01/14/2019   Type 2 diabetes mellitus without complications ([name]) 01/14/2019   Stricture and stenosis of esophagus    Problems with swallowing and mastication    Food impaction of esophagus    H/O coronary artery bypass surgery 01/17/2016   Cerebrovascular accident (CVA) due to embolism ([name]) 01/03/2016   Hypothyroidism, unspecified 01/03/2016   HLD (hyperlipidemia) 01/03/2016   Stable angina ([name]) 11/29/2015   Benign essential hypertension 05/27/2015   Bilateral carotid artery stenosis 02/24/2015   Atherosclerotic peripheral vascular disease ([name]) 09/07/2014   Bladder calculus 08/12/2014   Primary osteoarthritis of left knee 03/10/2014   Elevated prostate specific antigen (PSA) 02/05/2013   Snoring 01/22/2013   Sleepiness 01/22/2013   Dysphagia 01/22/2013   Occlusion and stenosis of carotid artery without mention of cerebral infarction 01/22/2013   Cerebral thrombosis with cerebral infarction ([name]) 01/22/2013   Benign prostatic hyperplasia with incomplete bladder emptying 08/26/2012   Chronic prostatitis 08/26/2012   Family history of malignant neoplasm of prostate 08/26/2012   Incomplete emptying of bladder 08/26/2012   Dysphagia 05/14/2012  Global aphasia 05/14/2012   Stroke, acute, thrombotic ([name]) 05/12/2012   Symptomatic carotid artery stenosis 05/12/2012   HTN (hypertension) 05/12/2012   COPD (chronic obstructive pulmonary disease) ([name]) 05/12/2012   Acute respiratory failure ([name]) 05/12/2012    FROM INITIAL EVALUATION PCP: Myrene Buddy, NP  REFERRING PROVIDER: Myrene Buddy, *   REFERRING DIAG: I63.10 (ICD-10-CM) - Cerebral infarction due to embolism of unspecified precerebral artery S06.9XAA (ICD-10-CM) - Unspecified intracranial injury with loss of  consciousness status unknown, initial encounter R47.01 (ICD-10-CM) - Aphasia S01.90XA (ICD-10-CM) - Unspecified open wound of unspecified part of head, initial encounter  RATIONALE FOR EVALUATION AND TREATMENT: Rehabilitation  THERAPY DIAG: Unsteadiness on feet  Muscle weakness (generalized)  ONSET DATE: 05/12/23  FOLLOW-UP APPT SCHEDULED WITH REFERRING PROVIDER: Yes    SUBJECTIVE:                                                                                                                                                                                         SUBJECTIVE STATEMENT:   Severe imbalance with R sided weakness;   PERTINENT HISTORY:  Pt referred to PT for unsteadiness s/p CVA. He was hospitalized at [facility] on 05/12/23-05/15/23 for chills, weakness, and left-sided testicular pain for several days. Noted baseline history of CVA with right-sided deficits. This was acutely worsened w/ his other sx. Urinalysis mildly indicative of infection. Troponin 19-28. COVID flu and RSV negative. Lactate within normal limits. CT head- negative acute intracranial abnormality. Old left MCA infarct with left frontal encephalomalacia (R sided weakness). Atrophy, chronic microvascular disease. Brain MRI read as punctate acute or subacute infarct in the right superior cerebellum. No associated hemorrhage or mass effect. Scrotal ultrasound showed bilateral hydroceles, simple on the right and complex (multiseptated) left, bilateral varicoceles, epididymides are enlarged and heterogeneous in appearance bilaterally, with multiple epididymal cysts and/or spermatoceles, and multiple calcifications. No evidence of testicular torsion, or findings suggestive of epididymo-orchitis.Initially treated with IV rocephin, flagyl, vancomycin as well as topical miconazole cream. Neurology and urology were consulted and was admitted for further wrkp/management. Neurology did not recommend any further neurological workup and he  was asymptomatic. Additionally, patient was found to have significantly elevated blood pressures throughout his stay and was not on home therapy. He was started on entresto given his CAD and HFpEF following permissive hypertensive period however he was taken off after his recent cardiology visit. Pt has had 3 falls since returning home from the hospital. He had a notable worsening of his balance with recent CVA. Recently finished speech therapy.   Pain: No Numbness/Tingling: No Focal Weakness: Yes, RUE/RLE weakness, mild R facial weakness Recent changes in overall  health/medication: Yes Prior history of physical therapy for balance:  Yes, 2013 and L MCA infarct Dominant hand: right Imaging: Yes  Red flags: Negative for bowel/bladder changes, personal history of cancer, abdominal pain, chills/fever, night sweats, nausea, vomiting, unrelenting pain,  PRECAUTIONS: None  WEIGHT BEARING RESTRICTIONS: No  FALLS: Has patient fallen in last 6 months? Yes. 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   Patient Goals: Pt wants to be able to work in the yard, strengthen his R side, and improve his balance.    OBJECTIVE:   Patient Surveys  FOTO: 50, predicted improvement to 44   Cognition Patient is oriented to person, place, and time.  Recent memory is intact.  Remote memory is intact.  Attention span and concentration are intact.  Expressive aphasia noted; Patient's fund of knowledge is within normal limits for educational level.    Gross  Musculoskeletal Assessment Tremor: None Bulk: Normal Tone: Normal  Posture: No gross abnormalities noted in standing or seated posture  AROM No gross deficits noted with functional activities  LE MMT: MMT (out of 5) Right  Left   Hip flexion 4+ 4+  Hip extension    Hip abduction (seated) 5 5  Hip adduction (seated) 5 5  Hip internal rotation 5 5  Hip external rotation 5 5  Knee flexion 5 5  Knee extension 5 5  Ankle dorsiflexion 5 5  Ankle plantarflexion Active Active  Ankle inversion    Ankle eversion    (* = pain; Blank rows = not tested)  Slight decrease in R shoulder flexion and R grip strength noted compared to left side  Sensation Grossly intact to light touch throughout bilateral LEs as determined by testing dermatomes L2-S2. Proprioception, stereognosis, and hot/cold testing deferred on this date.  Reflexes Deferred  Cranial Nerves Visual acuity and visual fields are intact  Extraocular muscles are intact  Facial sensation is intact bilaterally  Facial strength demonstrates slight droop in R corner of mouth  Hearing is normal as tested by gross conversation Palate elevates midline, normal phonation  Shoulder shrug strength is intact  Tongue protrudes midline  Coordination/Cerebellar Finger to Nose: Mildly dysmetric RUE Heel to Shin: Mildly dysmetric RLE Rapid alternating movements: WNL Finger Opposition: More difficult R hand Pronator Drift: Negative  Bed mobility: Deferred  Transfers: Assistive device utilized: None  Sit to stand: Complete Independence Stand to sit: Complete Independence Chair to chair: Complete Independence Floor:  Not tested  Curb:  Not tested  Stairs: Level of Assistance: Modified independence Stair Negotiation Technique: Step to Pattern with Bilateral Rails Number of Stairs: 4  Height of Stairs: 6"  Comments: Utilized step-to pattern during descent only;  Gait: Gait pattern: decreased step length- Left and  decreased stance time- Right Distance walked: 100 Assistive device utilized: None Level of assistance: Complete Independence Comments: Slight decrease stance time RLE, decreased self-selected speed  Functional Outcome Measures  Results Comments  BERG 49/56   DGI    FGA    TUG 9.5 seconds   5TSTS 11.8 seconds   6 Minute Walk Test  10 Meter Gait Speed    (Blank rows = not tested)   TODAY'S TREATMENT (07/09/23):    ***  SUBJECTIVE: Pt reports that he is doing well today. No changes since the last therapy session. No pain reported. No specific questions or concerns.    PAIN: None reported   Ther-ex  NuStep L1-4 x 10 minutes for warm-up and BLE strengthening during interval history, therapist adjusting resistance and monitoring fatigue to ensure proper difficulty (5 minutes unbilled); 6" forward step-ups without UE support x 10 BLE; 6" lateral step-downs x 10 BLE; Nautilus resisted gait 40# forward, backward, R lateral, and L lateral x 3 each direction; Sit to stand with overhead 8# med ball press 2 x 10;   Neuromuscular Re-education  12" step taps without UE support x 10 BLE; Forward high knee marching in // bars with contralateral hand to knee x 4 lengths (motor planning very difficult for patient); Forward gait in hallway with horizontal and vertical head turns x 70' each;    Not performed: Standing hip strengthening with 4# ankle weights: Hip flexion marches x 30s BLE; HS curls x 30s BLE; Hip abduction x 30s BLE; Hip extension x 30s BLE; Standing heel raises with BUE support x 30s; Seated LAQ with 4# ankle weights x 30s BLE; Tandem balance alternating forward LE x 30s each; Tandem gait in // bars without UE support x multiple lengths; Airex feet together (FT) ball passes around body to therapist with return pass on opposite side x multiple bouts on each side;   PATIENT EDUCATION:  Education details: Pt educated throughout session about proper posture and  technique with exercises. Improved exercise technique, movement at target joints, use of target muscles after min to mod verbal, visual, tactile cues. Balance exercises. Person educated: Patient and Spouse Education method: Explanation Education comprehension: verbalized understanding   HOME EXERCISE PROGRAM:  Access Code: [email or web address] URL: [email or web address] Date: 07/03/2023 Prepared by: Ria Comment  Exercises - Seated March with Resistance  - 1 x daily - 7 x weekly - 3 sets - 30s hold - Seated Hip Adduction Isometrics with Ball  - 1 x daily - 7 x weekly - 3 sets - 30s hold - Seated Hip Abduction with Resistance  - 1 x daily - 7 x weekly - 3 sets - 30s hold - Seated Long Arc Quad  - 1 x daily - 7 x weekly - 3 sets - 30s hold - Mini Squat with Counter Support  - 1 x daily - 7 x weekly - 3 sets - 30s hold - Heel Raises with Counter Support  - 1 x daily - 7 x weekly - 3 sets - 30s hold   ASSESSMENT:  CLINICAL IMPRESSION: ***  Progressed balance and strength exercises during session today with patient. Notable motor planning issues with more complex tasks during session today. Plan to progress strengthening and balance exercises at future session. Will add balance exercises to HEP at future session when appropriate. No HEP updates today. Pt encouraged to follow-up as scheduled. Pt will benefit from PT services to address deficits in strength, balance, and mobility in order to return to full function at home and decrease his risk for falls.    OBJECTIVE IMPAIRMENTS: Abnormal gait, decreased balance, decreased coordination, difficulty walking, and decreased strength.   ACTIVITY LIMITATIONS: carrying, lifting, bending, squatting, stairs, and transfers  PARTICIPATION LIMITATIONS: meal prep, cleaning, shopping, community activity, and yard work  PERSONAL FACTORS: Age and 3+ comorbidities: aphasia, CAD, carotid stenosis, prior CVA,  CHF, and COPD  are also affecting patient's  functional outcome.   REHAB POTENTIAL: Good  CLINICAL DECISION MAKING: Unstable/unpredictable  EVALUATION COMPLEXITY: High   GOALS: Goals reviewed with patient? No  SHORT TERM GOALS: Target date: 08/06/2023  Pt will be independent with HEP in order to improve strength and balance in order to decrease fall risk and improve function at home. Baseline:  Goal status: INITIAL   LONG TERM GOALS: Target date: 09/03/2023  Pt will increase FOTO to at least 62 to demonstrate significant improvement in function at home related to balance  Baseline: 50 Goal status: INITIAL  2.  Pt will improve BERG by at least 3 points in order to demonstrate clinically significant improvement in balance.   Baseline: 06/21/23: 49/56 Goal status: INITIAL  3. Pt will improve DGI by at least 3 points in order to demonstrate clinically significant improvement in balance and decreased risk for falls.     Baseline: 14/24 Goal status: INITIAL  4. Pt will increase by at least 24m (19ft) in order to demonstrate clinically significant improvement in cardiopulmonary endurance and community ambulation   Baseline: 24' without assistive device. Occasional R foot shuffling Goal status: INITIAL   PLAN: PT FREQUENCY: 2x/week  PT DURATION: 8 weeks  PLANNED INTERVENTIONS: Therapeutic exercises, Therapeutic activity, Neuromuscular re-education, Balance training, Gait training, Patient/Family education, Self Care, Joint mobilization, Joint manipulation, Vestibular training, Canalith repositioning, Orthotic/Fit training, DME instructions, Dry Needling, Electrical stimulation, Spinal manipulation, Spinal mobilization, Cryotherapy, Moist heat, Taping, Traction, Ultrasound, Ionotophoresis 4mg /ml Dexamethasone, Manual therapy, and Re-evaluation.  PLAN FOR NEXT SESSION: Progress balance and strengthening exercises, modify/review HEP (add balance exercises) as necessary;   Viviann Spare, PT, DPT  07/09/2023, 1:36 PM

## 2023-07-10 ENCOUNTER — Ambulatory Visit: Payer: Medicare HMO

## 2023-07-10 DIAGNOSIS — M6281 Muscle weakness (generalized): Secondary | ICD-10-CM

## 2023-07-10 DIAGNOSIS — R2681 Unsteadiness on feet: Secondary | ICD-10-CM | POA: Diagnosis not present

## 2023-07-12 DIAGNOSIS — N35014 Post-traumatic urethral stricture, male, unspecified: Secondary | ICD-10-CM | POA: Diagnosis not present

## 2023-07-13 ENCOUNTER — Ambulatory Visit: Payer: Medicare HMO

## 2023-07-13 DIAGNOSIS — R2681 Unsteadiness on feet: Secondary | ICD-10-CM | POA: Diagnosis not present

## 2023-07-13 DIAGNOSIS — M6281 Muscle weakness (generalized): Secondary | ICD-10-CM | POA: Diagnosis not present

## 2023-07-13 NOTE — Therapy (Signed)
OUTPATIENT PHYSICAL THERAPY BALANCE TREATMENT   Patient Name: Paul Bass MRN: [id number] DOB:[birth year], 77 y.o., male Today's Date: 07/13/2023  END OF SESSION:  PT End of Session - 07/13/23 0856     Visit Number 6    Number of Visits 17    Date for PT Re-Evaluation 08/16/23    Authorization Type eval: 06/21/23    PT Start Time 0847    PT Stop Time 0930    PT Time Calculation (min) 43 min    Equipment Utilized During Treatment Gait belt    Activity Tolerance Patient tolerated treatment well            Past Medical History:  Diagnosis Date   Accelerated junctional rhythm    Anemia    Anxiety    a.) Tx'd with BZO PRN   Arthritis    Bilateral carotid artery disease ([name])    BPH (benign prostatic hypertrophy)    CAD S/P percutaneous coronary angioplasty    a.) PCI in 2002 placing a RCA stent (unknown type). b.) LHC 12/07/2015: 5% ISR m-dRCA, 90% dRCA, 25% pRCA, 25% p-mLCx, 100% OM3, 95% oOM2-OM2, 75% m-dLAD; refer to CVTS. c.) 3v CABG 01/03/2016   CHF (congestive heart failure) ([name])    a.) TTE 05/12/2012: EF 50-55%; mild LA enlargement; G1DD. b.)  TTE 11/26/2015: EF 45%; mild BAE; triv PR, mild MR/TR; inferolateral HK; G1DD. c.)  TTE 11/07/2017: EF 35%; RV enlargement; BAE; inferior and inferolateral HK; triv PR; mild MR/TR. d.)  TTE 09/17/2019: EF 35%; mild LVH; triv MR/TR. e.)  TTE 08/11/2021: EF 50%; triv MR/TR; G1DD.   Cognitive deficit following cerebrovascular accident (CVA)    COPD (chronic obstructive pulmonary disease) ([name])    Cortical cataract    Depression    Dyspnea    GERD (gastroesophageal reflux disease)    History of 2019 novel coronavirus disease (COVID-19) 01/19/2021   History of acute inferior wall MI 2002   a.) PCI was performed placing a RCA stent (unknown type)   History of kidney stones    HTN (hypertension)    Hyperlipidemia    Hypothyroidism    Long term current use of antithrombotics/antiplatelets    a.) DAPT therapy (ASA + clopidogrel)    OSA (obstructive sleep apnea)    a.) does not require nocturnal PAP therapy   PLMD (periodic limb movement disorder)    Postoperative atrial fibrillation ([name]) 01/03/2016   a.) following CABG procedure   Prostatitis    PVD (peripheral vascular disease) ([name])    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 b/) residual RIGHT sided weakness   T2DM (type 2 diabetes mellitus) ([name])    Tobacco abuse    Past Surgical History:  Procedure Laterality Date   CARDIAC CATHETERIZATION N/A 12/07/2015   Procedure: Left Heart Cath and Coronary Angiography;  Surgeon: Lamar Blinks, MD;  Location: [facility];  Service: Cardiovascular;  Laterality: N/A;   CAROTID ANGIOGRAPHY N/A 06/05/2022   Procedure: CAROTID ANGIOGRAPHY;  Surgeon: Annice Needy, MD;  Location: [facility];  Service: Cardiovascular;  Laterality: N/A;   CAROTID PTA/STENT INTERVENTION Left    HX: 2 stents   CATARACT EXTRACTION W/PHACO Left 03/24/2021   Procedure: CATARACT EXTRACTION PHACO AND INTRAOCULAR LENS PLACEMENT (  IOC) LEFT DIABETIC 6.67 00:42.5;  Surgeon: Galen Manila, MD;  Location: [facility];  Service: Ophthalmology;  Laterality: Left;   COLONOSCOPY WITH PROPOFOL N/A 02/18/2019   Procedure: COLONOSCOPY WITH PROPOFOL;  Surgeon: Midge Minium, MD;  Location: [facility] ENDOSCOPY;  Service: Endoscopy;  Laterality: N/A;   COLONOSCOPY WITH PROPOFOL N/A 12/22/2021   Procedure: COLONOSCOPY WITH PROPOFOL;  Surgeon: Midge Minium, MD;  Location: [facility] ENDOSCOPY;  Service: Endoscopy;  Laterality: N/A;   CORONARY ANGIOPLASTY WITH STENT PLACEMENT Left 2002   CORONARY ARTERY BYPASS GRAFT N/A 01/03/2016   Procedure: 3v CORONARY ARTERY BYPASS GRAFT (LIMA-LAD, SVG-PDA, SVG-OM2); Location: [facility];  Surgeon: Cain Sieve, MD   CYSTOSCOPY W/ URETERAL STENT REMOVAL N/A 10/04/2021   Procedure: CYSTOSCOPY WITH PROSTATE FOREIGN BODY REMOVAL;  Surgeon: Riki Altes, MD;  Location: [facility];  Service: Urology;  Laterality: N/A;   CYSTOSCOPY WITH INSERTION OF UROLIFT N/A 04/29/2019   Procedure: CYSTOSCOPY WITH INSERTION OF UROLIFT;  Surgeon: Riki Altes, MD;  Location: [facility];  Service: Urology;  Laterality: N/A;   ESOPHAGOGASTRODUODENOSCOPY (EGD) WITH PROPOFOL N/A 09/11/2016   Procedure: ESOPHAGOGASTRODUODENOSCOPY (EGD) WITH PROPOFOL;  Surgeon: Midge Minium, MD;  Location: [facility] ENDOSCOPY;  Service: Endoscopy;  Laterality: N/A;   ESOPHAGOGASTRODUODENOSCOPY (EGD) WITH PROPOFOL N/A 10/17/2016   Procedure: ESOPHAGOGASTRODUODENOSCOPY (EGD) WITH PROPOFOL;  Surgeon: Midge Minium, MD;  Location: [facility] ENDOSCOPY;  Service: Endoscopy;  Laterality: N/A;   ESOPHAGOGASTRODUODENOSCOPY (EGD) WITH PROPOFOL N/A 11/13/2017   Procedure: ESOPHAGOGASTRODUODENOSCOPY (EGD) WITH PROPOFOL;  Surgeon: Midge Minium, MD;  Location: [facility] ENDOSCOPY;  Service: Endoscopy;  Laterality: N/A;   pins R lower leg Left    rotator cuff replaced Right    SAVORY DILATION N/A 10/17/2016   Procedure: SAVORY DILATION;  Surgeon: Midge Minium, MD;  Location: [facility] ENDOSCOPY;  Service: Endoscopy;  Laterality: N/A;   TRANSURETHRAL INCISION OF PROSTATE N/A 10/04/2021   Procedure: TRANSURETHRAL INCISION OF THE PROSTATE (TUIP);  Surgeon: Riki Altes, MD;  Location: [facility] OR[facility];  Service: Urology;  Laterality: N/A;   Patient Active Problem List   Diagnosis Date Noted   Acute cystitis without hematuria 05/15/2023   Pseudomonas infection 05/15/2023   Pseudomonas urinary tract infection 05/14/2023   Left testicular pain 05/13/2023   Generalized weakness 05/13/2023   Cellulitis of scrotum 05/13/2023   Cerebellar stroke, acute ([name]) 05/13/2023   Cellulitis 05/12/2023   Cellulitis of groin 05/12/2023   Carotid stenosis, symptomatic,  with infarction ([name]) 06/05/2022   History of colonic polyps    Anxiety state 11/17/2021   Cognitive deficit, post-stroke 11/17/2021   Ex-smoker 11/17/2021   Bradycardia 02/03/2021   Chronic systolic CHF (congestive heart failure), NYHA class 2 ([name]) 05/27/2020   Accelerated junctional rhythm 04/21/2019   Encounter for screening colonoscopy    Polyp of colon    CAD (coronary artery disease) 01/14/2019   Depression 01/14/2019   GERD (gastroesophageal reflux disease) 01/14/2019   Periodic limb movement disorder (PLMD) 01/14/2019   Sleep apnea 01/14/2019   Type 2 diabetes mellitus without complications ([name]) 01/14/2019   Stricture and stenosis of esophagus    Problems with swallowing and mastication    Food impaction of esophagus    H/O coronary artery bypass surgery 01/17/2016   Cerebrovascular accident (CVA) due to embolism ([name]) 01/03/2016   Hypothyroidism, unspecified 01/03/2016   HLD (hyperlipidemia) 01/03/2016   Stable angina ([name]) 11/29/2015   Benign essential hypertension 05/27/2015   Bilateral carotid artery stenosis 02/24/2015   Atherosclerotic peripheral vascular disease ([name]) 09/07/2014   Bladder calculus 08/12/2014  Primary osteoarthritis of left knee 03/10/2014   Elevated prostate specific antigen (PSA) 02/05/2013   Snoring 01/22/2013   Sleepiness 01/22/2013   Dysphagia 01/22/2013   Occlusion and stenosis of carotid artery without mention of cerebral infarction 01/22/2013   Cerebral thrombosis with cerebral infarction ([name]) 01/22/2013   Benign prostatic hyperplasia with incomplete bladder emptying 08/26/2012   Chronic prostatitis 08/26/2012   Family history of malignant neoplasm of prostate 08/26/2012   Incomplete emptying of bladder 08/26/2012   Dysphagia 05/14/2012   Global aphasia 05/14/2012   Stroke, acute, thrombotic ([name]) 05/12/2012   Symptomatic carotid artery stenosis 05/12/2012   HTN (hypertension) 05/12/2012   COPD (chronic obstructive pulmonary  disease) ([name]) 05/12/2012   Acute respiratory failure ([name]) 05/12/2012    FROM INITIAL EVALUATION PCP: Myrene Buddy, NP  REFERRING PROVIDER: Myrene Buddy, *   REFERRING DIAG: I63.10 (ICD-10-CM) - Cerebral infarction due to embolism of unspecified precerebral artery S06.9XAA (ICD-10-CM) - Unspecified intracranial injury with loss of consciousness status unknown, initial encounter R47.01 (ICD-10-CM) - Aphasia S01.90XA (ICD-10-CM) - Unspecified open wound of unspecified part of head, initial encounter  RATIONALE FOR EVALUATION AND TREATMENT: Rehabilitation  THERAPY DIAG: Unsteadiness on feet  Muscle weakness (generalized)  ONSET DATE: 05/12/23  FOLLOW-UP APPT SCHEDULED WITH REFERRING PROVIDER: Yes    SUBJECTIVE:                                                                                                                                                                                         SUBJECTIVE STATEMENT:   Severe imbalance with R sided weakness;   PERTINENT HISTORY:  Pt referred to PT for unsteadiness s/p CVA. He was hospitalized at [facility] on 05/12/23-05/15/23 for chills, weakness, and left-sided testicular pain for several days. Noted baseline history of CVA with right-sided deficits. This was acutely worsened w/ his other sx. Urinalysis mildly indicative of infection. Troponin 19-28. COVID flu and RSV negative. Lactate within normal limits. CT head- negative acute intracranial abnormality. Old left MCA infarct with left frontal encephalomalacia (R sided weakness). Atrophy, chronic microvascular disease. Brain MRI read as punctate acute or subacute infarct in the right superior cerebellum. No associated hemorrhage or mass effect. Scrotal ultrasound showed bilateral hydroceles, simple on the right and complex (multiseptated) left, bilateral varicoceles, epididymides are enlarged and heterogeneous in appearance bilaterally, with multiple epididymal cysts and/or  spermatoceles, and multiple calcifications. No evidence of testicular torsion, or findings suggestive of epididymo-orchitis.Initially treated with IV rocephin, flagyl, vancomycin as well as topical miconazole cream. Neurology and urology were consulted and was admitted for further wrkp/management. Neurology did not recommend any further neurological workup and he was asymptomatic. Additionally,  patient was found to have significantly elevated blood pressures throughout his stay and was not on home therapy. He was started on entresto given his CAD and HFpEF following permissive hypertensive period however he was taken off after his recent cardiology visit. Pt has had 3 falls since returning home from the hospital. He had a notable worsening of his balance with recent CVA. Recently finished speech therapy.   Pain: No Numbness/Tingling: No Focal Weakness: Yes, RUE/RLE weakness, mild R facial weakness Recent changes in overall health/medication: Yes Prior history of physical therapy for balance:  Yes, 2013 and L MCA infarct Dominant hand: right Imaging: Yes  Red flags: Negative for bowel/bladder changes, personal history of cancer, abdominal pain, chills/fever, night sweats, nausea, vomiting, unrelenting pain,  PRECAUTIONS: None  WEIGHT BEARING RESTRICTIONS: No  FALLS: Has patient fallen in last 6 months? Yes. 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   Patient Goals: Pt wants to be able to work in the yard, strengthen his R side, and improve his balance.     OBJECTIVE:   Patient Surveys  FOTO: 50, predicted improvement to 35   Cognition Patient is oriented to person, place, and time.  Recent memory is intact.  Remote memory is intact.  Attention span and concentration are intact.  Expressive aphasia noted; Patient's fund of knowledge is within normal limits for educational level.    Gross Musculoskeletal Assessment Tremor: None Bulk: Normal Tone: Normal  Posture: No gross abnormalities noted in standing or seated posture  AROM No gross deficits noted with functional activities  LE MMT: MMT (out of 5) Right  Left   Hip flexion 4+ 4+  Hip extension    Hip abduction (seated) 5 5  Hip adduction (seated) 5 5  Hip internal rotation 5 5  Hip external rotation 5 5  Knee flexion 5 5  Knee extension 5 5  Ankle dorsiflexion 5 5  Ankle plantarflexion Active Active  Ankle inversion    Ankle eversion    (* = pain; Blank rows = not tested)  Slight decrease in R shoulder flexion and R grip strength noted compared to left side  Sensation Grossly intact to light touch throughout bilateral LEs as determined by testing dermatomes L2-S2. Proprioception, stereognosis, and hot/cold testing deferred on this date.  Reflexes Deferred  Cranial Nerves Visual acuity and visual fields are intact  Extraocular muscles are intact  Facial sensation is intact bilaterally  Facial strength demonstrates slight droop in R corner of mouth  Hearing is normal as tested by gross conversation Palate elevates midline, normal phonation  Shoulder shrug strength is intact  Tongue protrudes midline  Coordination/Cerebellar Finger to Nose: Mildly dysmetric RUE Heel to Shin: Mildly dysmetric RLE Rapid alternating movements: WNL Finger Opposition: More difficult R hand Pronator Drift: Negative  Bed mobility: Deferred  Transfers: Assistive device utilized: None  Sit to stand: Complete Independence Stand to sit: Complete Independence Chair to  chair: Complete Independence Floor:  Not tested  Curb:  Not tested  Stairs: Level of Assistance: Modified independence Stair Negotiation Technique: Step to Pattern with Bilateral  Rails Number of Stairs: 4  Height of Stairs: 6"  Comments: Utilized step-to pattern during descent only;  Gait: Gait pattern: decreased step length- Left and decreased stance time- Right Distance walked: 100 Assistive device utilized: None Level of assistance: Complete Independence Comments: Slight decrease stance time RLE, decreased self-selected speed  Functional Outcome Measures  Results Comments  BERG 49/56   DGI    FGA    TUG 9.5 seconds   5TSTS 11.8 seconds   6 Minute Walk Test    10 Meter Gait Speed    (Blank rows = not tested)   TODAY'S TREATMENT (07/13/23):      SUBJECTIVE: Pt reports that he is doing well today. No changes since the last therapy session. No pain reported. No specific questions or concerns.    PAIN: None reported   Ther-ex  NuStep L1-4 x 8 minutes for warm-up and BLE strengthening during interval history, therapist adjusting resistance and monitoring fatigue to ensure proper difficulty (5 minutes unbilled); 6" forward step-ups without UE support x 10 BLE; Seated LAQ with 5# ankle weights x 30s BLE;  Standing hip strengthening with 5# ankle weights: Hip flexion marches x 30s BLE; HS curls x 30s BLE; Hip abduction x 30s BLE; Hip extension x 30s BLE;   Neuromuscular Re-education  Tandem balance alternating forward LE x 30s each Tandem balance alternating forward LE horizontal and vertical head turns x 30s each Blaze pods with random setting, 5 pods, 2 cycles, 60s/cycle. Star toe taps from 9 to 3 o'clock; Blaze pods with random setting, 5 pods with 4 distracting colors, 2 cycles, 60s/cycle. Star toe taps from 9 to 3 o'clock, significantly more difficult for patient due to the cognitive challenge;   Not performed: Standing heel raises with BUE support x  30s; Tandem gait in // bars without UE support x multiple lengths; Airex feet together (FT) ball passes around body to therapist with return pass on opposite side x multiple bouts on each side; 6" lateral step-downs x 10 BLE; Nautilus resisted gait 40# forward x 3 laps, R lateral, and L lateral x 2 each direction; Sit to stand with overhead 8# med ball press 2 x 10; Step ups onto airex pad x 10 leading with each LE   Standing semi tandem with one foot on airex and other on 6" step 2 x 30 seconds leading with each LE  Forward high knee marching in // bars with contralateral hand to knee x 4 lengths (motor planning very difficult for patient);   PATIENT EDUCATION:  Education details: Pt educated throughout session about proper posture and technique with exercises. Improved exercise technique, movement at target joints, use of target muscles after min to mod verbal, visual, tactile cues. Balance exercises. Person educated: Patient Education method: Explanation Education comprehension: verbalized understanding   HOME EXERCISE PROGRAM:  Access Code: [email or web address] URL: [email or web address] Date: 07/03/2023 Prepared by: Ria Comment  Exercises - Seated March with Resistance  - 1 x daily - 7 x weekly - 3 sets - 30s hold - Seated Hip Adduction Isometrics with Ball  - 1 x daily - 7 x weekly - 3 sets - 30s hold - Seated Hip Abduction with Resistance  - 1 x daily - 7 x weekly - 3 sets - 30s hold - Seated Long Arc Quad  - 1 x daily - 7 x weekly - 3 sets - 30s hold - Mini Squat with Counter Support  - 1 x daily - 7 x weekly - 3 sets -  30s hold - Heel Raises with Counter Support  - 1 x daily - 7 x weekly - 3 sets - 30s hold   ASSESSMENT:  CLINICAL IMPRESSION:   Patient arrives to treatment session motivated to participate. Session focused on BLE strengthening and balance. Introduced [employer] to add a cognitive challenge to balance tasks. He experiences multiple episodes of LOB  during toe taps requiring multiple steps as well as external assistance to correct. Gait belt donned throughout session. No HEP updates today. Patient will continue to benefit from skilled therapy to address remaining deficits in order to improve quality of life and return to prior level of function.  OBJECTIVE IMPAIRMENTS: Abnormal gait, decreased balance, decreased coordination, difficulty walking, and decreased strength.   ACTIVITY LIMITATIONS: carrying, lifting, bending, squatting, stairs, and transfers  PARTICIPATION LIMITATIONS: meal prep, cleaning, shopping, community activity, and yard work  PERSONAL FACTORS: Age and 3+ comorbidities: aphasia, CAD, carotid stenosis, prior CVA, CHF, and COPD  are also affecting patient's functional outcome.   REHAB POTENTIAL: Good  CLINICAL DECISION MAKING: Unstable/unpredictable  EVALUATION COMPLEXITY: High   GOALS: Goals reviewed with patient? No  SHORT TERM GOALS: Target date: 08/10/2023  Pt will be independent with HEP in order to improve strength and balance in order to decrease fall risk and improve function at home. Baseline:  Goal status: INITIAL   LONG TERM GOALS: Target date: 08/16/2023  Pt will increase FOTO to at least 62 to demonstrate significant improvement in function at home related to balance  Baseline: 50 Goal status: INITIAL  2.  Pt will improve BERG by at least 3 points in order to demonstrate clinically significant improvement in balance.   Baseline: 06/21/23: 49/56 Goal status: INITIAL  3. Pt will improve DGI by at least 3 points in order to demonstrate clinically significant improvement in balance and decreased risk for falls.     Baseline: 14/24 Goal status: INITIAL  4. Pt will increase by at least 63m (150ft) in order to demonstrate clinically significant improvement in cardiopulmonary endurance and community ambulation   Baseline: 58' without assistive device. Occasional R foot shuffling Goal status:  INITIAL   PLAN: PT FREQUENCY: 2x/week  PT DURATION: 8 weeks  PLANNED INTERVENTIONS: Therapeutic exercises, Therapeutic activity, Neuromuscular re-education, Balance training, Gait training, Patient/Family education, Self Care, Joint mobilization, Joint manipulation, Vestibular training, Canalith repositioning, Orthotic/Fit training, DME instructions, Dry Needling, Electrical stimulation, Spinal manipulation, Spinal mobilization, Cryotherapy, Moist heat, Taping, Traction, Ultrasound, Ionotophoresis 4mg /ml Dexamethasone, Manual therapy, and Re-evaluation.  PLAN FOR NEXT SESSION: Progress balance and strengthening exercises, modify/review HEP (add balance exercises) as necessary;  Lynnea Maizes PT, DPT, GCS  Mashal Slavick, PT, DPT  07/13/2023, 11:57 AM

## 2023-07-13 NOTE — Therapy (Signed)
OUTPATIENT PHYSICAL THERAPY BALANCE TREATMENT   Patient Name: Paul Bass MRN: [id number] DOB:1946/05/30, 76 y.o., male Today's Date: 07/17/2023  END OF SESSION:  PT End of Session - 07/17/23 1152     Visit Number 7    Number of Visits 17    Date for PT Re-Evaluation 08/16/23    Authorization Type eval: 06/21/23    PT Start Time 1149    PT Stop Time 1230    PT Time Calculation (min) 41 min    Equipment Utilized During Treatment Gait belt    Activity Tolerance Patient tolerated treatment well            Past Medical History:  Diagnosis Date   Accelerated junctional rhythm    Anemia    Anxiety    a.) Tx'd with BZO PRN   Arthritis    Bilateral carotid artery disease ([name])    BPH (benign prostatic hypertrophy)    CAD S/P percutaneous coronary angioplasty    a.) PCI in 2002 placing a RCA stent (unknown type). b.) LHC 12/07/2015: 5% ISR m-dRCA, 90% dRCA, 25% pRCA, 25% p-mLCx, 100% OM3, 95% oOM2-OM2, 75% m-dLAD; refer to CVTS. c.) 3v CABG 01/03/2016   CHF (congestive heart failure) ([name])    a.) TTE 05/12/2012: EF 50-55%; mild LA enlargement; G1DD. b.)  TTE 11/26/2015: EF 45%; mild BAE; triv PR, mild MR/TR; inferolateral HK; G1DD. c.)  TTE 11/07/2017: EF 35%; RV enlargement; BAE; inferior and inferolateral HK; triv PR; mild MR/TR. d.)  TTE 09/17/2019: EF 35%; mild LVH; triv MR/TR. e.)  TTE 08/11/2021: EF 50%; triv MR/TR; G1DD.   Cognitive deficit following cerebrovascular accident (CVA)    COPD (chronic obstructive pulmonary disease) ([name])    Cortical cataract    Depression    Dyspnea    GERD (gastroesophageal reflux disease)    History of 2019 novel coronavirus disease (COVID-19) 01/19/2021   History of acute inferior wall MI 2002   a.) PCI was performed placing a RCA stent (unknown type)   History of kidney stones    HTN (hypertension)    Hyperlipidemia    Hypothyroidism    Long term current use of antithrombotics/antiplatelets    a.) DAPT therapy (ASA + clopidogrel)    OSA (obstructive sleep apnea)    a.) does not require nocturnal PAP therapy   PLMD (periodic limb movement disorder)    Postoperative atrial fibrillation ([name]) 01/03/2016   a.) following CABG procedure   Prostatitis    PVD (peripheral vascular disease) ([name])    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 b/) residual RIGHT sided weakness   T2DM (type 2 diabetes mellitus) ([name])    Tobacco abuse    Past Surgical History:  Procedure Laterality Date   CARDIAC CATHETERIZATION N/A 12/07/2015   Procedure: Left Heart Cath and Coronary Angiography;  Surgeon: Lamar Blinks, MD;  Location: [facility];  Service: Cardiovascular;  Laterality: N/A;   CAROTID ANGIOGRAPHY N/A 06/05/2022   Procedure: CAROTID ANGIOGRAPHY;  Surgeon: Annice Needy, MD;  Location: [facility];  Service: Cardiovascular;  Laterality: N/A;   CAROTID PTA/STENT INTERVENTION Left    HX: 2 stents   CATARACT EXTRACTION W/PHACO Left 03/24/2021   Procedure: CATARACT EXTRACTION PHACO AND INTRAOCULAR LENS PLACEMENT (  IOC) LEFT DIABETIC 6.67 00:42.5;  Surgeon: Galen Manila, MD;  Location: [facility];  Service: Ophthalmology;  Laterality: Left;   COLONOSCOPY WITH PROPOFOL N/A 02/18/2019   Procedure: COLONOSCOPY WITH PROPOFOL;  Surgeon: Midge Minium, MD;  Location: [facility] ENDOS[facility];  Service: Endoscopy;  Laterality: N/A;   COLONOSCOPY WITH PROPOFOL N/A 12/22/2021   Procedure: COLONOSCOPY WITH PROPOFOL;  Surgeon: Midge Minium, MD;  Location: [facility] ENDOSCOPY;  Service: Endoscopy;  Laterality: N/A;   CORONARY ANGIOPLASTY WITH STENT PLACEMENT Left 2002   CORONARY ARTERY BYPASS GRAFT N/A 01/03/2016   Procedure: 3v CORONARY ARTERY BYPASS GRAFT (LIMA-LAD, SVG-PDA, SVG-OM2); Location: [facility];  Surgeon: Cain Sieve, MD   CYSTOSCOPY W/ URETERAL STENT REMOVAL N/A 10/04/2021   Procedure: CYSTOSCOPY WITH PROSTATE FOREIGN BODY REMOVAL;  Surgeon: Riki Altes, MD;  Location: [facility];  Service: Urology;  Laterality: N/A;   CYSTOSCOPY WITH INSERTION OF UROLIFT N/A 04/29/2019   Procedure: CYSTOSCOPY WITH INSERTION OF UROLIFT;  Surgeon: Riki Altes, MD;  Location: [facility];  Service: Urology;  Laterality: N/A;   ESOPHAGOGASTRODUODENOSCOPY (EGD) WITH PROPOFOL N/A 09/11/2016   Procedure: ESOPHAGOGASTRODUODENOSCOPY (EGD) WITH PROPOFOL;  Surgeon: Midge Minium, MD;  Location: [facility];  Service: Endoscopy;  Laterality: N/A;   ESOPHAGOGASTRODUODENOSCOPY (EGD) WITH PROPOFOL N/A 10/17/2016   Procedure: ESOPHAGOGASTRODUODENOSCOPY (EGD) WITH PROPOFOL;  Surgeon: Midge Minium, MD;  Location: [facility];  Service: Endoscopy;  Laterality: N/A;   ESOPHAGOGASTRODUODENOSCOPY (EGD) WITH PROPOFOL N/A 11/13/2017   Procedure: ESOPHAGOGASTRODUODENOSCOPY (EGD) WITH PROPOFOL;  Surgeon: Midge Minium, MD;  Location: [facility];  Service: Endoscopy;  Laterality: N/A;   pins R lower leg Left    rotator cuff replaced Right    SAVORY DILATION N/A 10/17/2016   Procedure: SAVORY DILATION;  Surgeon: Midge Minium, MD;  Location: [facility];  Service: Endoscopy;  Laterality: N/A;   TRANSURETHRAL INCISION OF PROSTATE N/A 10/04/2021   Procedure: TRANSURETHRAL INCISION OF THE PROSTATE (TUIP);  Surgeon: Riki Altes, MD;  Location: [facility];  Service: Urology;  Laterality: N/A;   Patient Active Problem List   Diagnosis Date Noted   Acute cystitis without hematuria 05/15/2023   Pseudomonas infection 05/15/2023   Pseudomonas urinary tract infection 05/14/2023   Left testicular pain 05/13/2023   Generalized weakness 05/13/2023   Cellulitis of scrotum 05/13/2023   Cerebellar stroke, acute ([name]) 05/13/2023   Cellulitis 05/12/2023   Cellulitis of groin 05/12/2023   Carotid stenosis, symptomatic,  with infarction ([name]) 06/05/2022   History of colonic polyps    Anxiety state 11/17/2021   Cognitive deficit, post-stroke 11/17/2021   Ex-smoker 11/17/2021   Bradycardia 02/03/2021   Chronic systolic CHF (congestive heart failure), NYHA class 2 ([name]) 05/27/2020   Accelerated junctional rhythm 04/21/2019   Encounter for screening colonoscopy    Polyp of colon    CAD (coronary artery disease) 01/14/2019   Depression 01/14/2019   GERD (gastroesophageal reflux disease) 01/14/2019   Periodic limb movement disorder (PLMD) 01/14/2019   Sleep apnea 01/14/2019   Type 2 diabetes mellitus without complications ([name]) 01/14/2019   Stricture and stenosis of esophagus    Problems with swallowing and mastication    Food impaction of esophagus    H/O coronary artery bypass surgery 01/17/2016   Cerebrovascular accident (CVA) due to embolism ([name]) 01/03/2016   Hypothyroidism, unspecified 01/03/2016   HLD (hyperlipidemia) 01/03/2016   Stable angina ([name]) 11/29/2015   Benign essential hypertension 05/27/2015   Bilateral carotid artery stenosis 02/24/2015   Atherosclerotic peripheral vascular disease ([name]) 09/07/2014   Bladder calculus 08/12/2014  Primary osteoarthritis of left knee 03/10/2014   Elevated prostate specific antigen (PSA) 02/05/2013   Snoring 01/22/2013   Sleepiness 01/22/2013   Dysphagia 01/22/2013   Occlusion and stenosis of carotid artery without mention of cerebral infarction 01/22/2013   Cerebral thrombosis with cerebral infarction ([name]) 01/22/2013   Benign prostatic hyperplasia with incomplete bladder emptying 08/26/2012   Chronic prostatitis 08/26/2012   Family history of malignant neoplasm of prostate 08/26/2012   Incomplete emptying of bladder 08/26/2012   Dysphagia 05/14/2012   Global aphasia 05/14/2012   Stroke, acute, thrombotic ([name]) 05/12/2012   Symptomatic carotid artery stenosis 05/12/2012   HTN (hypertension) 05/12/2012   COPD (chronic obstructive pulmonary  disease) ([name]) 05/12/2012   Acute respiratory failure ([name]) 05/12/2012    FROM INITIAL EVALUATION PCP: Myrene Buddy, NP  REFERRING PROVIDER: Myrene Buddy, *   REFERRING DIAG: I63.10 (ICD-10-CM) - Cerebral infarction due to embolism of unspecified precerebral artery S06.9XAA (ICD-10-CM) - Unspecified intracranial injury with loss of consciousness status unknown, initial encounter R47.01 (ICD-10-CM) - Aphasia S01.90XA (ICD-10-CM) - Unspecified open wound of unspecified part of head, initial encounter  RATIONALE FOR EVALUATION AND TREATMENT: Rehabilitation  THERAPY DIAG: Unsteadiness on feet  Muscle weakness (generalized)  ONSET DATE: 05/12/23  FOLLOW-UP APPT SCHEDULED WITH REFERRING PROVIDER: Yes    SUBJECTIVE:                                                                                                                                                                                         SUBJECTIVE STATEMENT:   Severe imbalance with R sided weakness;   PERTINENT HISTORY:  Pt referred to PT for unsteadiness s/p CVA. He was hospitalized at [facility] on 05/12/23-05/15/23 for chills, weakness, and left-sided testicular pain for several days. Noted baseline history of CVA with right-sided deficits. This was acutely worsened w/ his other sx. Urinalysis mildly indicative of infection. Troponin 19-28. COVID flu and RSV negative. Lactate within normal limits. CT head- negative acute intracranial abnormality. Old left MCA infarct with left frontal encephalomalacia (R sided weakness). Atrophy, chronic microvascular disease. Brain MRI read as punctate acute or subacute infarct in the right superior cerebellum. No associated hemorrhage or mass effect. Scrotal ultrasound showed bilateral hydroceles, simple on the right and complex (multiseptated) left, bilateral varicoceles, epididymides are enlarged and heterogeneous in appearance bilaterally, with multiple epididymal cysts and/or  spermatoceles, and multiple calcifications. No evidence of testicular torsion, or findings suggestive of epididymo-orchitis.Initially treated with IV rocephin, flagyl, vancomycin as well as topical miconazole cream. Neurology and urology were consulted and was admitted for further wrkp/management. Neurology did not recommend any further neurological workup and he was asymptomatic. Additionally,  patient was found to have significantly elevated blood pressures throughout his stay and was not on home therapy. He was started on entresto given his CAD and HFpEF following permissive hypertensive period however he was taken off after his recent cardiology visit. Pt has had 3 falls since returning home from the hospital. He had a notable worsening of his balance with recent CVA. Recently finished speech therapy.   Pain: No Numbness/Tingling: No Focal Weakness: Yes, RUE/RLE weakness, mild R facial weakness Recent changes in overall health/medication: Yes Prior history of physical therapy for balance:  Yes, 2013 and L MCA infarct Dominant hand: right Imaging: Yes  Red flags: Negative for bowel/bladder changes, personal history of cancer, abdominal pain, chills/fever, night sweats, nausea, vomiting, unrelenting pain,  PRECAUTIONS: None  WEIGHT BEARING RESTRICTIONS: No  FALLS: Has patient fallen in last 6 months? Yes. 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   Patient Goals: Pt wants to be able to work in the yard, strengthen his R side, and improve his balance.     OBJECTIVE:   Patient Surveys  FOTO: 50, predicted improvement to 88   Cognition Patient is oriented to person, place, and time.  Recent memory is intact.  Remote memory is intact.  Attention span and concentration are intact.  Expressive aphasia noted; Patient's fund of knowledge is within normal limits for educational level.    Gross Musculoskeletal Assessment Tremor: None Bulk: Normal Tone: Normal  Posture: No gross abnormalities noted in standing or seated posture  AROM No gross deficits noted with functional activities  LE MMT: MMT (out of 5) Right  Left   Hip flexion 4+ 4+  Hip extension    Hip abduction (seated) 5 5  Hip adduction (seated) 5 5  Hip internal rotation 5 5  Hip external rotation 5 5  Knee flexion 5 5  Knee extension 5 5  Ankle dorsiflexion 5 5  Ankle plantarflexion Active Active  Ankle inversion    Ankle eversion    (* = pain; Blank rows = not tested)  Slight decrease in R shoulder flexion and R grip strength noted compared to left side  Sensation Grossly intact to light touch throughout bilateral LEs as determined by testing dermatomes L2-S2. Proprioception, stereognosis, and hot/cold testing deferred on this date.  Reflexes Deferred  Cranial Nerves Visual acuity and visual fields are intact  Extraocular muscles are intact  Facial sensation is intact bilaterally  Facial strength demonstrates slight droop in R corner of mouth  Hearing is normal as tested by gross conversation Palate elevates midline, normal phonation  Shoulder shrug strength is intact  Tongue protrudes midline  Coordination/Cerebellar Finger to Nose: Mildly dysmetric RUE Heel to Shin: Mildly dysmetric RLE Rapid alternating movements: WNL Finger Opposition: More difficult R hand Pronator Drift: Negative  Bed mobility: Deferred  Transfers: Assistive device utilized: None  Sit to stand: Complete Independence Stand to sit: Complete Independence Chair to  chair: Complete Independence Floor:  Not tested  Curb:  Not tested  Stairs: Level of Assistance: Modified independence Stair Negotiation Technique: Step to Pattern with Bilateral  Rails Number of Stairs: 4  Height of Stairs: 6"  Comments: Utilized step-to pattern during descent only;  Gait: Gait pattern: decreased step length- Left and decreased stance time- Right Distance walked: 100 Assistive device utilized: None Level of assistance: Complete Independence Comments: Slight decrease stance time RLE, decreased self-selected speed  Functional Outcome Measures  Results Comments  BERG 49/56   DGI    FGA    TUG 9.5 seconds   5TSTS 11.8 seconds   6 Minute Walk Test    10 Meter Gait Speed    (Blank rows = not tested)   TODAY'S TREATMENT (07/17/23):      SUBJECTIVE: Pt reports that he is doing well today. No changes since the last therapy session. No pain reported. No specific questions or concerns.    PAIN: None reported   Ther-ex  NuStep L1-4 x 10 minutes for warm-up and BLE strengthening during interval history, therapist adjusting resistance and monitoring fatigue to ensure proper difficulty (3 minutes unbilled); Total Gym (TG) Level 22 (L22) double leg squats x 15; TG L22 single leg squats 2 x 10 BLE;  6" forward step-ups without UE support x 10 BLE; Sit to stand with overhead 8# med ball press x 10;  Seated LAQ with 5# ankle weights x 30s BLE;  Standing hip strengthening with 5# ankle weights: Hip flexion marches x 30s BLE; HS curls x 30s BLE; Hip abduction x 30s BLE; Hip extension x 30s BLE;   Neuromuscular Re-education  Alternating 12" step taps without UE support x 10 BLE; Airex alternating 12" step taps without UE support x 10 BLE; Standing heel/toe raises with BUE support x 10 each;   Not performed: Tandem gait in // bars without UE support x multiple lengths; Airex feet together (FT) ball passes around body to therapist with return pass on opposite  side x multiple bouts on each side; 6" lateral step-downs x 10 BLE; Nautilus resisted gait 40# forward x 3 laps, R lateral, and L lateral x 2 each direction; Step ups onto airex pad x 10 leading with each LE   Standing semi tandem with one foot on airex and other on 6" step 2 x 30 seconds leading with each LE  Forward high knee marching in // bars with contralateral hand to knee x 4 lengths (motor planning very difficult for patient); Tandem balance alternating forward LE x 30s each Tandem balance alternating forward LE horizontal and vertical head turns x 30s each Blaze pods with random setting, 5 pods, 2 cycles, 60s/cycle. Star toe taps from 9 to 3 o'clock; Blaze pods with random setting, 5 pods with 4 distracting colors, 2 cycles, 60s/cycle. Star toe taps from 9 to 3 o'clock, significantly more difficult for patient due to the cognitive challenge;   PATIENT EDUCATION:  Education details: Pt educated throughout session about proper posture and technique with exercises. Improved exercise technique, movement at target joints, use of target muscles after min to mod verbal, visual, tactile cues. Balance exercises. Person educated: Patient Education method: Explanation Education comprehension: verbalized understanding   HOME EXERCISE PROGRAM:  Access Code: [email or web address] URL: [email or web address] Date: 07/03/2023 Prepared by: Ria Comment  Exercises - Seated March with Resistance  - 1 x daily - 7 x weekly - 3 sets - 30s hold - Seated Hip Adduction Isometrics with Ball  - 1 x daily - 7 x weekly - 3 sets - 30s hold - Seated Hip Abduction with Resistance  - 1 x daily - 7 x weekly -  3 sets - 30s hold - Seated Long Arc Quad  - 1 x daily - 7 x weekly - 3 sets - 30s hold - Mini Squat with Counter Support  - 1 x daily - 7 x weekly - 3 sets - 30s hold - Heel Raises with Counter Support  - 1 x daily - 7 x weekly - 3 sets - 30s hold   ASSESSMENT:  CLINICAL IMPRESSION:   Patient  arrives to treatment session motivated to participate. Session focused on BLE strengthening and balance. Increased ankle weights to 5# and introduced double and single leg squats on the Total Gym. Single leg squats are very challenging for patient. Gait belt donned throughout session. No HEP updates today. Patient will continue to benefit from skilled therapy to address remaining deficits in order to improve quality of life and return to prior level of function.  OBJECTIVE IMPAIRMENTS: Abnormal gait, decreased balance, decreased coordination, difficulty walking, and decreased strength.   ACTIVITY LIMITATIONS: carrying, lifting, bending, squatting, stairs, and transfers  PARTICIPATION LIMITATIONS: meal prep, cleaning, shopping, community activity, and yard work  PERSONAL FACTORS: Age and 3+ comorbidities: aphasia, CAD, carotid stenosis, prior CVA, CHF, and COPD  are also affecting patient's functional outcome.   REHAB POTENTIAL: Good  CLINICAL DECISION MAKING: Unstable/unpredictable  EVALUATION COMPLEXITY: High   GOALS: Goals reviewed with patient? No  SHORT TERM GOALS: Target date: 08/10/2023  Pt will be independent with HEP in order to improve strength and balance in order to decrease fall risk and improve function at home. Baseline:  Goal status: INITIAL   LONG TERM GOALS: Target date: 08/16/2023  Pt will increase FOTO to at least 62 to demonstrate significant improvement in function at home related to balance  Baseline: 50 Goal status: INITIAL  2.  Pt will improve BERG by at least 3 points in order to demonstrate clinically significant improvement in balance.   Baseline: 06/21/23: 49/56 Goal status: INITIAL  3. Pt will improve DGI by at least 3 points in order to demonstrate clinically significant improvement in balance and decreased risk for falls.     Baseline: 14/24 Goal status: INITIAL  4. Pt will increase by at least 32m (171ft) in order to demonstrate clinically  significant improvement in cardiopulmonary endurance and community ambulation   Baseline: 75' without assistive device. Occasional R foot shuffling Goal status: INITIAL   PLAN: PT FREQUENCY: 2x/week  PT DURATION: 8 weeks  PLANNED INTERVENTIONS: Therapeutic exercises, Therapeutic activity, Neuromuscular re-education, Balance training, Gait training, Patient/Family education, Self Care, Joint mobilization, Joint manipulation, Vestibular training, Canalith repositioning, Orthotic/Fit training, DME instructions, Dry Needling, Electrical stimulation, Spinal manipulation, Spinal mobilization, Cryotherapy, Moist heat, Taping, Traction, Ultrasound, Ionotophoresis 4mg /ml Dexamethasone, Manual therapy, and Re-evaluation.  PLAN FOR NEXT SESSION: Progress balance and strengthening exercises, modify/review HEP (add balance exercises) as necessary;  Lynnea Maizes PT, DPT, GCS  Yuktha Kerchner, PT, DPT  07/17/2023, 2:26 PM

## 2023-07-17 ENCOUNTER — Ambulatory Visit: Payer: Medicare HMO

## 2023-07-17 DIAGNOSIS — M6281 Muscle weakness (generalized): Secondary | ICD-10-CM | POA: Diagnosis not present

## 2023-07-17 DIAGNOSIS — I5022 Chronic systolic (congestive) heart failure: Secondary | ICD-10-CM | POA: Diagnosis not present

## 2023-07-17 DIAGNOSIS — R2681 Unsteadiness on feet: Secondary | ICD-10-CM

## 2023-07-19 ENCOUNTER — Ambulatory Visit: Payer: Medicare HMO

## 2023-07-19 DIAGNOSIS — J449 Chronic obstructive pulmonary disease, unspecified: Secondary | ICD-10-CM | POA: Diagnosis not present

## 2023-07-19 DIAGNOSIS — I2510 Atherosclerotic heart disease of native coronary artery without angina pectoris: Secondary | ICD-10-CM | POA: Diagnosis not present

## 2023-07-19 DIAGNOSIS — N35919 Unspecified urethral stricture, male, unspecified site: Secondary | ICD-10-CM | POA: Diagnosis not present

## 2023-07-19 DIAGNOSIS — R339 Retention of urine, unspecified: Secondary | ICD-10-CM | POA: Diagnosis not present

## 2023-07-19 DIAGNOSIS — E785 Hyperlipidemia, unspecified: Secondary | ICD-10-CM | POA: Diagnosis not present

## 2023-07-19 DIAGNOSIS — I110 Hypertensive heart disease with heart failure: Secondary | ICD-10-CM | POA: Diagnosis not present

## 2023-07-19 DIAGNOSIS — R2681 Unsteadiness on feet: Secondary | ICD-10-CM | POA: Diagnosis not present

## 2023-07-19 DIAGNOSIS — E1151 Type 2 diabetes mellitus with diabetic peripheral angiopathy without gangrene: Secondary | ICD-10-CM | POA: Diagnosis not present

## 2023-07-19 DIAGNOSIS — R948 Abnormal results of function studies of other organs and systems: Secondary | ICD-10-CM | POA: Diagnosis not present

## 2023-07-19 DIAGNOSIS — M6281 Muscle weakness (generalized): Secondary | ICD-10-CM | POA: Diagnosis not present

## 2023-07-19 DIAGNOSIS — N210 Calculus in bladder: Secondary | ICD-10-CM | POA: Diagnosis not present

## 2023-07-19 DIAGNOSIS — N35814 Other anterior urethral stricture, male: Secondary | ICD-10-CM | POA: Diagnosis not present

## 2023-07-19 DIAGNOSIS — I5022 Chronic systolic (congestive) heart failure: Secondary | ICD-10-CM | POA: Diagnosis not present

## 2023-07-19 NOTE — Therapy (Signed)
OUTPATIENT PHYSICAL THERAPY BALANCE TREATMENT   Patient Name: Paul Bass MRN: [id number] DOB:November 25, 1945, 77 y.o., male Today's Date: 07/19/2023  END OF SESSION:  PT End of Session - 07/19/23 1405     Visit Number 8    Number of Visits 17    Date for PT Re-Evaluation 08/16/23    Authorization Type eval: 06/21/23    PT Start Time 1401    PT Stop Time 1445    PT Time Calculation (min) 44 min    Equipment Utilized During Treatment Gait belt    Activity Tolerance Patient tolerated treatment well            Past Medical History:  Diagnosis Date   Accelerated junctional rhythm    Anemia    Anxiety    a.) Tx'd with BZO PRN   Arthritis    Bilateral carotid artery disease ([name])    BPH (benign prostatic hypertrophy)    CAD S/P percutaneous coronary angioplasty    a.) PCI in 2002 placing a RCA stent (unknown type). b.) LHC 12/07/2015: 5% ISR m-dRCA, 90% dRCA, 25% pRCA, 25% p-mLCx, 100% OM3, 95% oOM2-OM2, 75% m-dLAD; refer to CVTS. c.) 3v CABG 01/03/2016   CHF (congestive heart failure) ([name])    a.) TTE 05/12/2012: EF 50-55%; mild LA enlargement; G1DD. b.)  TTE 11/26/2015: EF 45%; mild BAE; triv PR, mild MR/TR; inferolateral HK; G1DD. c.)  TTE 11/07/2017: EF 35%; RV enlargement; BAE; inferior and inferolateral HK; triv PR; mild MR/TR. d.)  TTE 09/17/2019: EF 35%; mild LVH; triv MR/TR. e.)  TTE 08/11/2021: EF 50%; triv MR/TR; G1DD.   Cognitive deficit following cerebrovascular accident (CVA)    COPD (chronic obstructive pulmonary disease) ([name])    Cortical cataract    Depression    Dyspnea    GERD (gastroesophageal reflux disease)    History of 2019 novel coronavirus disease (COVID-19) 01/19/2021   History of acute inferior wall MI 2002   a.) PCI was performed placing a RCA stent (unknown type)   History of kidney stones    HTN (hypertension)    Hyperlipidemia    Hypothyroidism    Long term current use of antithrombotics/antiplatelets    a.) DAPT therapy (ASA + clopidogrel)    OSA (obstructive sleep apnea)    a.) does not require nocturnal PAP therapy   PLMD (periodic limb movement disorder)    Postoperative atrial fibrillation ([name]) 01/03/2016   a.) following CABG procedure   Prostatitis    PVD (peripheral vascular disease) ([name])    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 b/) residual RIGHT sided weakness   T2DM (type 2 diabetes mellitus) ([name])    Tobacco abuse    Past Surgical History:  Procedure Laterality Date   CARDIAC CATHETERIZATION N/A 12/07/2015   Procedure: Left Heart Cath and Coronary Angiography;  Surgeon: Lamar Blinks, MD;  Location: [facility];  Service: Cardiovascular;  Laterality: N/A;   CAROTID ANGIOGRAPHY N/A 06/05/2022   Procedure: CAROTID ANGIOGRAPHY;  Surgeon: Annice Needy, MD;  Location: [facility];  Service: Cardiovascular;  Laterality: N/A;   CAROTID PTA/STENT INTERVENTION Left    HX: 2 stents   CATARACT EXTRACTION W/PHACO Left 03/24/2021   Procedure: CATARACT EXTRACTION PHACO AND INTRAOCULAR LENS PLACEMENT (  IOC) LEFT DIABETIC 6.67 00:42.5;  Surgeon: Galen Manila, MD;  Location: [facility];  Service: Ophthalmology;  Laterality: Left;   COLONOSCOPY WITH PROPOFOL N/A 02/18/2019   Procedure: COLONOSCOPY WITH PROPOFOL;  Surgeon: Midge Minium, MD;  Location: [facility] ENDOSCOPY;  Service: Endoscopy;  Laterality: N/A;   COLONOSCOPY WITH PROPOFOL N/A 12/22/2021   Procedure: COLONOSCOPY WITH PROPOFOL;  Surgeon: Midge Minium, MD;  Location: [facility] ENDOSCOPY;  Service: Endoscopy;  Laterality: N/A;   CORONARY ANGIOPLASTY WITH STENT PLACEMENT Left 2002   CORONARY ARTERY BYPASS GRAFT N/A 01/03/2016   Procedure: 3v CORONARY ARTERY BYPASS GRAFT (LIMA-LAD, SVG-PDA, SVG-OM2); Location: [facility];  Surgeon: Cain Sieve, MD   CYSTOSCOPY W/ URETERAL STENT REMOVAL N/A 10/04/2021   Procedure: CYSTOSCOPY WITH PROSTATE FOREIGN BODY REMOVAL;  Surgeon: Riki Altes, MD;  Location: [facility] OR[facility];  Service: Urology;  Laterality: N/A;   CYSTOSCOPY WITH INSERTION OF UROLIFT N/A 04/29/2019   Procedure: CYSTOSCOPY WITH INSERTION OF UROLIFT;  Surgeon: Riki Altes, MD;  Location: [facility] OR[facility];  Service: Urology;  Laterality: N/A;   ESOPHAGOGASTRODUODENOSCOPY (EGD) WITH PROPOFOL N/A 09/11/2016   Procedure: ESOPHAGOGASTRODUODENOSCOPY (EGD) WITH PROPOFOL;  Surgeon: Midge Minium, MD;  Location: [facility] ENDOSCOPY;  Service: Endoscopy;  Laterality: N/A;   ESOPHAGOGASTRODUODENOSCOPY (EGD) WITH PROPOFOL N/A 10/17/2016   Procedure: ESOPHAGOGASTRODUODENOSCOPY (EGD) WITH PROPOFOL;  Surgeon: Midge Minium, MD;  Location: [facility] ENDOSCOPY;  Service: Endoscopy;  Laterality: N/A;   ESOPHAGOGASTRODUODENOSCOPY (EGD) WITH PROPOFOL N/A 11/13/2017   Procedure: ESOPHAGOGASTRODUODENOSCOPY (EGD) WITH PROPOFOL;  Surgeon: Midge Minium, MD;  Location: [facility] ENDOSCOPY;  Service: Endoscopy;  Laterality: N/A;   pins R lower leg Left    rotator cuff replaced Right    SAVORY DILATION N/A 10/17/2016   Procedure: SAVORY DILATION;  Surgeon: Midge Minium, MD;  Location: [facility] ENDOSCOPY;  Service: Endoscopy;  Laterality: N/A;   TRANSURETHRAL INCISION OF PROSTATE N/A 10/04/2021   Procedure: TRANSURETHRAL INCISION OF THE PROSTATE (TUIP);  Surgeon: Riki Altes, MD;  Location: [facility];  Service: Urology;  Laterality: N/A;   Patient Active Problem List   Diagnosis Date Noted   Acute cystitis without hematuria 05/15/2023   Pseudomonas infection 05/15/2023   Pseudomonas urinary tract infection 05/14/2023   Left testicular pain 05/13/2023   Generalized weakness 05/13/2023   Cellulitis of scrotum 05/13/2023   Cerebellar stroke, acute ([name]) 05/13/2023   Cellulitis 05/12/2023   Cellulitis of groin 05/12/2023   Carotid stenosis, symptomatic,  with infarction ([name]) 06/05/2022   History of colonic polyps    Anxiety state 11/17/2021   Cognitive deficit, post-stroke 11/17/2021   Ex-smoker 11/17/2021   Bradycardia 02/03/2021   Chronic systolic CHF (congestive heart failure), NYHA class 2 ([name]) 05/27/2020   Accelerated junctional rhythm 04/21/2019   Encounter for screening colonoscopy    Polyp of colon    CAD (coronary artery disease) 01/14/2019   Depression 01/14/2019   GERD (gastroesophageal reflux disease) 01/14/2019   Periodic limb movement disorder (PLMD) 01/14/2019   Sleep apnea 01/14/2019   Type 2 diabetes mellitus without complications ([name]) 01/14/2019   Stricture and stenosis of esophagus    Problems with swallowing and mastication    Food impaction of esophagus    H/O coronary artery bypass surgery 01/17/2016   Cerebrovascular accident (CVA) due to embolism ([name]) 01/03/2016   Hypothyroidism, unspecified 01/03/2016   HLD (hyperlipidemia) 01/03/2016   Stable angina ([name]) 11/29/2015   Benign essential hypertension 05/27/2015   Bilateral carotid artery stenosis 02/24/2015   Atherosclerotic peripheral vascular disease ([name]) 09/07/2014   Bladder calculus 08/12/2014  Primary osteoarthritis of left knee 03/10/2014   Elevated prostate specific antigen (PSA) 02/05/2013   Snoring 01/22/2013   Sleepiness 01/22/2013   Dysphagia 01/22/2013   Occlusion and stenosis of carotid artery without mention of cerebral infarction 01/22/2013   Cerebral thrombosis with cerebral infarction ([name]) 01/22/2013   Benign prostatic hyperplasia with incomplete bladder emptying 08/26/2012   Chronic prostatitis 08/26/2012   Family history of malignant neoplasm of prostate 08/26/2012   Incomplete emptying of bladder 08/26/2012   Dysphagia 05/14/2012   Global aphasia 05/14/2012   Stroke, acute, thrombotic ([name]) 05/12/2012   Symptomatic carotid artery stenosis 05/12/2012   HTN (hypertension) 05/12/2012   COPD (chronic obstructive pulmonary  disease) ([name]) 05/12/2012   Acute respiratory failure ([name]) 05/12/2012    FROM INITIAL EVALUATION PCP: Myrene Buddy, NP  REFERRING PROVIDER: Myrene Buddy, *   REFERRING DIAG: I63.10 (ICD-10-CM) - Cerebral infarction due to embolism of unspecified precerebral artery S06.9XAA (ICD-10-CM) - Unspecified intracranial injury with loss of consciousness status unknown, initial encounter R47.01 (ICD-10-CM) - Aphasia S01.90XA (ICD-10-CM) - Unspecified open wound of unspecified part of head, initial encounter  RATIONALE FOR EVALUATION AND TREATMENT: Rehabilitation  THERAPY DIAG: Unsteadiness on feet  Muscle weakness (generalized)  ONSET DATE: 05/12/23  FOLLOW-UP APPT SCHEDULED WITH REFERRING PROVIDER: Yes    SUBJECTIVE:                                                                                                                                                                                         SUBJECTIVE STATEMENT:   Severe imbalance with R sided weakness;   PERTINENT HISTORY:  Pt referred to PT for unsteadiness s/p CVA. He was hospitalized at [facility] on 05/12/23-05/15/23 for chills, weakness, and left-sided testicular pain for several days. Noted baseline history of CVA with right-sided deficits. This was acutely worsened w/ his other sx. Urinalysis mildly indicative of infection. Troponin 19-28. COVID flu and RSV negative. Lactate within normal limits. CT head- negative acute intracranial abnormality. Old left MCA infarct with left frontal encephalomalacia (R sided weakness). Atrophy, chronic microvascular disease. Brain MRI read as punctate acute or subacute infarct in the right superior cerebellum. No associated hemorrhage or mass effect. Scrotal ultrasound showed bilateral hydroceles, simple on the right and complex (multiseptated) left, bilateral varicoceles, epididymides are enlarged and heterogeneous in appearance bilaterally, with multiple epididymal cysts and/or  spermatoceles, and multiple calcifications. No evidence of testicular torsion, or findings suggestive of epididymo-orchitis.Initially treated with IV rocephin, flagyl, vancomycin as well as topical miconazole cream. Neurology and urology were consulted and was admitted for further wrkp/management. Neurology did not recommend any further neurological workup and he was asymptomatic. Additionally,  patient was found to have significantly elevated blood pressures throughout his stay and was not on home therapy. He was started on entresto given his CAD and HFpEF following permissive hypertensive period however he was taken off after his recent cardiology visit. Pt has had 3 falls since returning home from the hospital. He had a notable worsening of his balance with recent CVA. Recently finished speech therapy.   Pain: No Numbness/Tingling: No Focal Weakness: Yes, RUE/RLE weakness, mild R facial weakness Recent changes in overall health/medication: Yes Prior history of physical therapy for balance:  Yes, 2013 and L MCA infarct Dominant hand: right Imaging: Yes  Red flags: Negative for bowel/bladder changes, personal history of cancer, abdominal pain, chills/fever, night sweats, nausea, vomiting, unrelenting pain,  PRECAUTIONS: None  WEIGHT BEARING RESTRICTIONS: No  FALLS: Has patient fallen in last 6 months? Yes. 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   Patient Goals: Pt wants to be able to work in the yard, strengthen his R side, and improve his balance.     OBJECTIVE:   Patient Surveys  FOTO: 50, predicted improvement to 81   Cognition Patient is oriented to person, place, and time.  Recent memory is intact.  Remote memory is intact.  Attention span and concentration are intact.  Expressive aphasia noted; Patient's fund of knowledge is within normal limits for educational level.    Gross Musculoskeletal Assessment Tremor: None Bulk: Normal Tone: Normal  Posture: No gross abnormalities noted in standing or seated posture  AROM No gross deficits noted with functional activities  LE MMT: MMT (out of 5) Right  Left   Hip flexion 4+ 4+  Hip extension    Hip abduction (seated) 5 5  Hip adduction (seated) 5 5  Hip internal rotation 5 5  Hip external rotation 5 5  Knee flexion 5 5  Knee extension 5 5  Ankle dorsiflexion 5 5  Ankle plantarflexion Active Active  Ankle inversion    Ankle eversion    (* = pain; Blank rows = not tested)  Slight decrease in R shoulder flexion and R grip strength noted compared to left side  Sensation Grossly intact to light touch throughout bilateral LEs as determined by testing dermatomes L2-S2. Proprioception, stereognosis, and hot/cold testing deferred on this date.  Reflexes Deferred  Cranial Nerves Visual acuity and visual fields are intact  Extraocular muscles are intact  Facial sensation is intact bilaterally  Facial strength demonstrates slight droop in R corner of mouth  Hearing is normal as tested by gross conversation Palate elevates midline, normal phonation  Shoulder shrug strength is intact  Tongue protrudes midline  Coordination/Cerebellar Finger to Nose: Mildly dysmetric RUE Heel to Shin: Mildly dysmetric RLE Rapid alternating movements: WNL Finger Opposition: More difficult R hand Pronator Drift: Negative  Bed mobility: Deferred  Transfers: Assistive device utilized: None  Sit to stand: Complete Independence Stand to sit: Complete Independence Chair to  chair: Complete Independence Floor:  Not tested  Curb:  Not tested  Stairs: Level of Assistance: Modified independence Stair Negotiation Technique: Step to Pattern with Bilateral  Rails Number of Stairs: 4  Height of Stairs: 6"  Comments: Utilized step-to pattern during descent only;  Gait: Gait pattern: decreased step length- Left and decreased stance time- Right Distance walked: 100 Assistive device utilized: None Level of assistance: Complete Independence Comments: Slight decrease stance time RLE, decreased self-selected speed  Functional Outcome Measures  Results Comments  BERG 49/56   DGI    FGA    TUG 9.5 seconds   5TSTS 11.8 seconds   6 Minute Walk Test    10 Meter Gait Speed    (Blank rows = not tested)   TODAY'S TREATMENT (07/19/23):      SUBJECTIVE: Pt reports that he is doing well today. No changes since the last therapy session. No pain reported. No specific questions or concerns.    PAIN: None reported   Ther-ex  NuStep L1-4 x 10 minutes for warm-up and BLE strengthening during interval history, therapist adjusting resistance and monitoring fatigue to ensure proper difficulty (3 minutes unbilled); Total Gym (TG) Level 22 (L22) double leg squats x 15; TG L22 single leg squats 2 x 10 BLE;   Seated LAQ with 5# ankle weights x 30s BLE;  Standing hip strengthening with 5# ankle weights: Hip flexion marches x 45s BLE; HS curls x 45s BLE; Hip abduction x 45s BLE; Hip extension x 45s BLE;   Neuromuscular Re-education  Forward high knee marching in // bars with contralateral hand to knee 2 x 2 lengths (motor planning very difficult for patient); Cross-over stepping in // bars with faded UE support x 4 lengths; Airex static balance eyes open/closed x 30s each; Airex feet apart ball passes around body to therapist with return pass on opposite side x multiple bouts on each side; Airex alternating 6" step taps without UE support x 10 BLE;   Not  performed: Tandem gait in // bars without UE support x multiple lengths; 6" lateral step-downs x 10 BLE; Nautilus resisted gait 40# forward x 3 laps, R lateral, and L lateral x 2 each direction; Step ups onto airex pad x 10 leading with each LE   Standing semi tandem with one foot on airex and other on 6" step 2 x 30 seconds leading with each LE  Tandem balance alternating forward LE x 30s each Tandem balance alternating forward LE horizontal and vertical head turns x 30s each Blaze pods with random setting, 5 pods, 2 cycles, 60s/cycle. Star toe taps from 9 to 3 o'clock; Blaze pods with random setting, 5 pods with 4 distracting colors, 2 cycles, 60s/cycle. Star toe taps from 9 to 3 o'clock, significantly more difficult for patient due to the cognitive challenge; Standing heel/toe raises with BUE support x 10 each; 6" forward step-ups without UE support x 10 BLE; Sit to stand with overhead 8# med ball press x 10;   PATIENT EDUCATION:  Education details: Pt educated throughout session about proper posture and technique with exercises. Improved exercise technique, movement at target joints, use of target muscles after min to mod verbal, visual, tactile cues. Balance exercises. Person educated: Patient Education method: Explanation Education comprehension: verbalized understanding   HOME EXERCISE PROGRAM:  Access Code: [email or web address] URL: [email or web address] Date: 07/03/2023 Prepared by: Ria Comment  Exercises - Seated March with Resistance  - 1 x daily - 7 x weekly - 3 sets - 30s hold - Seated Hip Adduction Isometrics with Ball  - 1 x daily - 7 x weekly - 3 sets - 30s hold - Seated Hip Abduction with Resistance  -  1 x daily - 7 x weekly - 3 sets - 30s hold - Seated Long Arc Quad  - 1 x daily - 7 x weekly - 3 sets - 30s hold - Mini Squat with Counter Support  - 1 x daily - 7 x weekly - 3 sets - 30s hold - Heel Raises with Counter Support  - 1 x daily - 7 x weekly - 3 sets -  30s hold   ASSESSMENT:  CLINICAL IMPRESSION:   Patient arrives to treatment session motivated to participate. Session focused on BLE strengthening and balance. Increased sets to 45s each for strengthening with ankle weights. Gait belt donned throughout session. He continues to struggle with motor planning as well as aphasia making feedback difficult. No HEP updates today. Patient will continue to benefit from skilled therapy to address remaining deficits in order to improve quality of life and return to prior level of function.  OBJECTIVE IMPAIRMENTS: Abnormal gait, decreased balance, decreased coordination, difficulty walking, and decreased strength.   ACTIVITY LIMITATIONS: carrying, lifting, bending, squatting, stairs, and transfers  PARTICIPATION LIMITATIONS: meal prep, cleaning, shopping, community activity, and yard work  PERSONAL FACTORS: Age and 3+ comorbidities: aphasia, CAD, carotid stenosis, prior CVA, CHF, and COPD  are also affecting patient's functional outcome.   REHAB POTENTIAL: Good  CLINICAL DECISION MAKING: Unstable/unpredictable  EVALUATION COMPLEXITY: High   GOALS: Goals reviewed with patient? No  SHORT TERM GOALS: Target date: 08/10/2023  Pt will be independent with HEP in order to improve strength and balance in order to decrease fall risk and improve function at home. Baseline:  Goal status: INITIAL   LONG TERM GOALS: Target date: 08/16/2023  Pt will increase FOTO to at least 62 to demonstrate significant improvement in function at home related to balance  Baseline: 50 Goal status: INITIAL  2.  Pt will improve BERG by at least 3 points in order to demonstrate clinically significant improvement in balance.   Baseline: 06/21/23: 49/56 Goal status: INITIAL  3. Pt will improve DGI by at least 3 points in order to demonstrate clinically significant improvement in balance and decreased risk for falls.     Baseline: 14/24 Goal status: INITIAL  4. Pt will  increase by at least 84m (130ft) in order to demonstrate clinically significant improvement in cardiopulmonary endurance and community ambulation   Baseline: 91' without assistive device. Occasional R foot shuffling Goal status: INITIAL   PLAN: PT FREQUENCY: 2x/week  PT DURATION: 8 weeks  PLANNED INTERVENTIONS: Therapeutic exercises, Therapeutic activity, Neuromuscular re-education, Balance training, Gait training, Patient/Family education, Self Care, Joint mobilization, Joint manipulation, Vestibular training, Canalith repositioning, Orthotic/Fit training, DME instructions, Dry Needling, Electrical stimulation, Spinal manipulation, Spinal mobilization, Cryotherapy, Moist heat, Taping, Traction, Ultrasound, Ionotophoresis 4mg /ml Dexamethasone, Manual therapy, and Re-evaluation.  PLAN FOR NEXT SESSION: Progress balance and strengthening exercises, modify/review HEP (add balance exercises) as necessary;  Lynnea Maizes PT, DPT, GCS  Keny Donald, PT, DPT  07/19/2023, 2:45 PM

## 2023-07-23 IMAGING — CR DG ABDOMEN 1V
1 series · 2 of 2 positions shown · non-contrast
Comparison: Renal sonogram dated August 28, 2018

CLINICAL DATA: Kidney stones.

EXAM:
ABDOMEN - 1 VIEW

[Series 1: dg abd 1 view · 0.14mm/px · 2 of 2 slices shown]
[im 1/2]
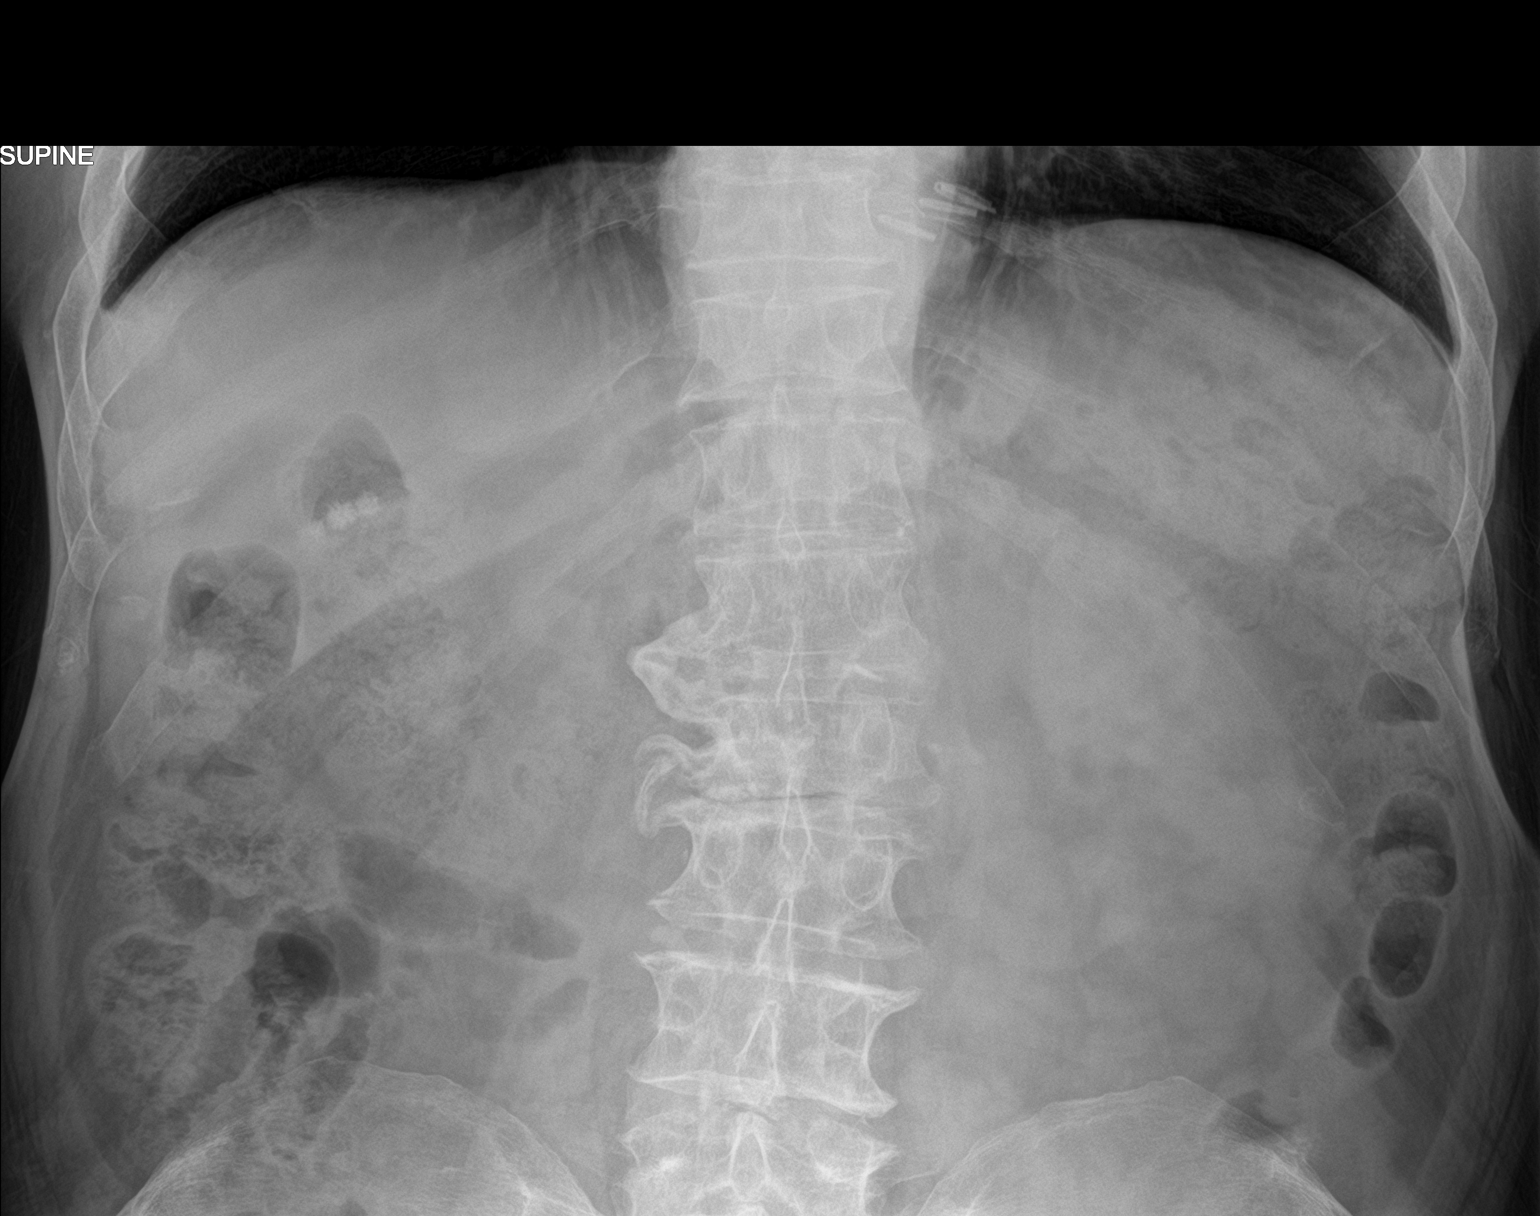
[im 2/2]
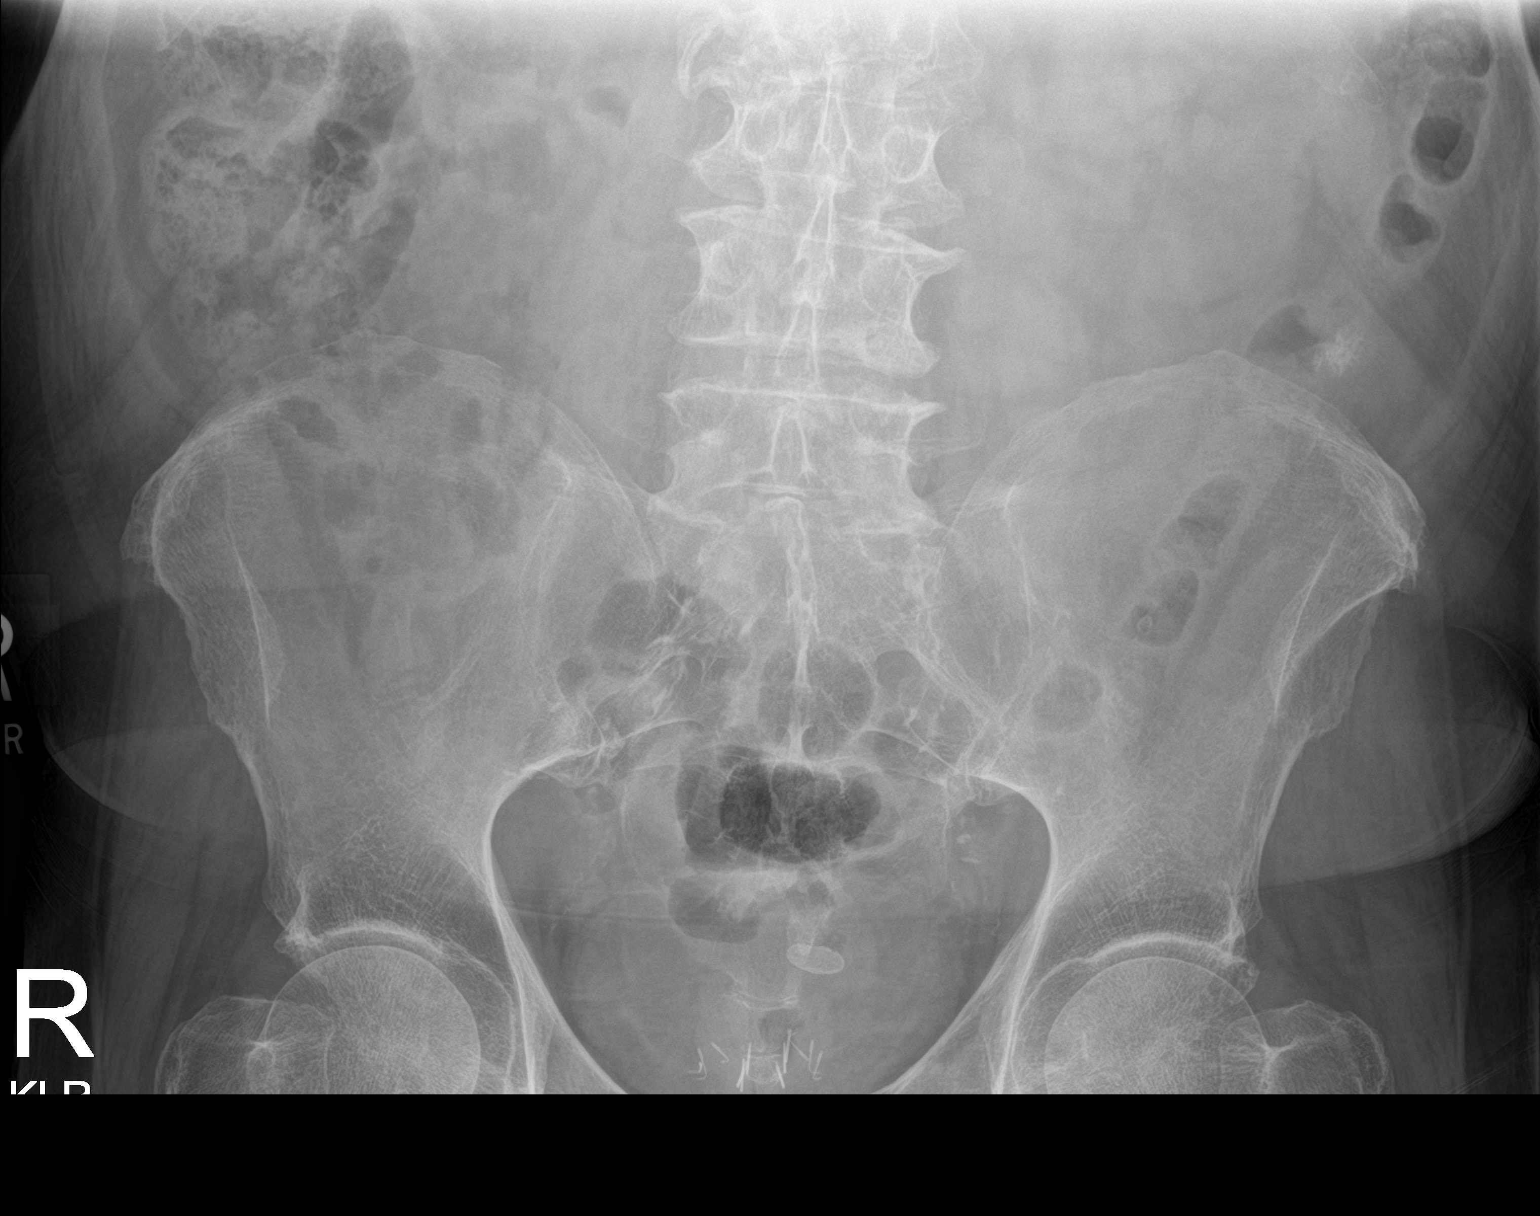

[2 of 2 positions shown; findings below may reference images not displayed]

FINDINGS: The bowel gas pattern is normal. Small rounded calculi in the right
upper quadrant, likely gallstones. No appreciable calculi overlying
the renal shadows, evaluation is however limited due to bowel
contents. Ovoid calculus in the pelvic cavity, which may represent a
calculus in the urinary bladder.

Advanced multilevel degenerate disc disease of the lumbar spine with
prominent osteophytes. Multiple surgical clips projecting over the
base of the urinary bladder.
IMPRESSION: 1. No appreciable renal calculi, evaluation is however limited due
to overlying bowel contents.

2.  Calculi in the right upper quadrant, likely gallstones.

3. Ovoid calculus in the pelvic cavity, which may represent a
urinary bladder calculus.

## 2023-07-26 ENCOUNTER — Ambulatory Visit: Payer: Medicare HMO

## 2023-07-26 DIAGNOSIS — R2681 Unsteadiness on feet: Secondary | ICD-10-CM | POA: Diagnosis not present

## 2023-07-26 DIAGNOSIS — M6281 Muscle weakness (generalized): Secondary | ICD-10-CM | POA: Diagnosis not present

## 2023-07-26 NOTE — Therapy (Signed)
OUTPATIENT PHYSICAL THERAPY BALANCE TREATMENT   Patient Name: Paul Bass MRN: [id number] DOB:1945/09/29, 77 y.o., male Today's Date: 07/26/2023  END OF SESSION:  PT End of Session - 07/26/23 1436     Visit Number 9    Number of Visits 17    Date for PT Re-Evaluation 08/16/23    Authorization Type eval: 06/21/23    PT Start Time 1445    PT Stop Time 1530    PT Time Calculation (min) 45 min    Equipment Utilized During Treatment Gait belt    Activity Tolerance Patient tolerated treatment well            Past Medical History:  Diagnosis Date   Accelerated junctional rhythm    Anemia    Anxiety    a.) Tx'd with BZO PRN   Arthritis    Bilateral carotid artery disease ([name])    BPH (benign prostatic hypertrophy)    CAD S/P percutaneous coronary angioplasty    a.) PCI in 2002 placing a RCA stent (unknown type). b.) LHC 12/07/2015: 5% ISR m-dRCA, 90% dRCA, 25% pRCA, 25% p-mLCx, 100% OM3, 95% oOM2-OM2, 75% m-dLAD; refer to CVTS. c.) 3v CABG 01/03/2016   CHF (congestive heart failure) ([name])    a.) TTE 05/12/2012: EF 50-55%; mild LA enlargement; G1DD. b.)  TTE 11/26/2015: EF 45%; mild BAE; triv PR, mild MR/TR; inferolateral HK; G1DD. c.)  TTE 11/07/2017: EF 35%; RV enlargement; BAE; inferior and inferolateral HK; triv PR; mild MR/TR. d.)  TTE 09/17/2019: EF 35%; mild LVH; triv MR/TR. e.)  TTE 08/11/2021: EF 50%; triv MR/TR; G1DD.   Cognitive deficit following cerebrovascular accident (CVA)    COPD (chronic obstructive pulmonary disease) ([name])    Cortical cataract    Depression    Dyspnea    GERD (gastroesophageal reflux disease)    History of 2019 novel coronavirus disease (COVID-19) 01/19/2021   History of acute inferior wall MI 2002   a.) PCI was performed placing a RCA stent (unknown type)   History of kidney stones    HTN (hypertension)    Hyperlipidemia    Hypothyroidism    Long term current use of antithrombotics/antiplatelets    a.) DAPT therapy (ASA + clopidogrel)    OSA (obstructive sleep apnea)    a.) does not require nocturnal PAP therapy   PLMD (periodic limb movement disorder)    Postoperative atrial fibrillation ([name]) 01/03/2016   a.) following CABG procedure   Prostatitis    PVD (peripheral vascular disease) ([name])    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 b/) residual RIGHT sided weakness   T2DM (type 2 diabetes mellitus) ([name])    Tobacco abuse    Past Surgical History:  Procedure Laterality Date   CARDIAC CATHETERIZATION N/A 12/07/2015   Procedure: Left Heart Cath and Coronary Angiography;  Surgeon: Lamar Blinks, MD;  Location: [facility];  Service: Cardiovascular;  Laterality: N/A;   CAROTID ANGIOGRAPHY N/A 06/05/2022   Procedure: CAROTID ANGIOGRAPHY;  Surgeon: Annice Needy, MD;  Location: [facility];  Service: Cardiovascular;  Laterality: N/A;   CAROTID PTA/STENT INTERVENTION Left    HX: 2 stents   CATARACT EXTRACTION W/PHACO Left 03/24/2021   Procedure: CATARACT EXTRACTION PHACO AND INTRAOCULAR LENS PLACEMENT (  IOC) LEFT DIABETIC 6.67 00:42.5;  Surgeon: Galen Manila, MD;  Location: [facility];  Service: Ophthalmology;  Laterality: Left;   COLONOSCOPY WITH PROPOFOL N/A 02/18/2019   Procedure: COLONOSCOPY WITH PROPOFOL;  Surgeon: Midge Minium, MD;  Location: [facility] ENDOSCOPY;  Service: Endoscopy;  Laterality: N/A;   COLONOSCOPY WITH PROPOFOL N/A 12/22/2021   Procedure: COLONOSCOPY WITH PROPOFOL;  Surgeon: Midge Minium, MD;  Location: [facility] END[facility]COPY;  Service: Endoscopy;  Laterality: N/A;   CORONARY ANGIOPLASTY WITH STENT PLACEMENT Left 2002   CORONARY ARTERY BYPASS GRAFT N/A 01/03/2016   Procedure: 3v CORONARY ARTERY BYPASS GRAFT (LIMA-LAD, SVG-PDA, SVG-OM2); Location: [facility];  Surgeon: Cain Sieve, MD   CYSTOSCOPY W/ URETERAL STENT REMOVAL N/A 10/04/2021   Procedure: CYSTOSCOPY WITH PROSTATE FOREIGN BODY REMOVAL;  Surgeon: Riki Altes, MD;  Location: [facility] OR[facility];  Service: Urology;  Laterality: N/A;   CYSTOSCOPY WITH INSERTION OF UROLIFT N/A 04/29/2019   Procedure: CYSTOSCOPY WITH INSERTION OF UROLIFT;  Surgeon: Riki Altes, MD;  Location: [facility];  Service: Urology;  Laterality: N/A;   ESOPHAGOGASTRODUODENOSCOPY (EGD) WITH PROPOFOL N/A 09/11/2016   Procedure: ESOPHAGOGASTRODUODENOSCOPY (EGD) WITH PROPOFOL;  Surgeon: Midge Minium, MD;  Location: [facility] ENDOSCOPY;  Service: Endoscopy;  Laterality: N/A;   ESOPHAGOGASTRODUODENOSCOPY (EGD) WITH PROPOFOL N/A 10/17/2016   Procedure: ESOPHAGOGASTRODUODENOSCOPY (EGD) WITH PROPOFOL;  Surgeon: Midge Minium, MD;  Location: [facility] ENDOSCOPY;  Service: Endoscopy;  Laterality: N/A;   ESOPHAGOGASTRODUODENOSCOPY (EGD) WITH PROPOFOL N/A 11/13/2017   Procedure: ESOPHAGOGASTRODUODENOSCOPY (EGD) WITH PROPOFOL;  Surgeon: Midge Minium, MD;  Location: [facility] ENDOSCOPY;  Service: Endoscopy;  Laterality: N/A;   pins R lower leg Left    rotator cuff replaced Right    SAVORY DILATION N/A 10/17/2016   Procedure: SAVORY DILATION;  Surgeon: Midge Minium, MD;  Location: [facility] ENDOSCOPY;  Service: Endoscopy;  Laterality: N/A;   TRANSURETHRAL INCISION OF PROSTATE N/A 10/04/2021   Procedure: TRANSURETHRAL INCISION OF THE PROSTATE (TUIP);  Surgeon: Riki Altes, MD;  Location: [facility] OR[facility];  Service: Urology;  Laterality: N/A;   Patient Active Problem List   Diagnosis Date Noted   Acute cystitis without hematuria 05/15/2023   Pseudomonas infection 05/15/2023   Pseudomonas urinary tract infection 05/14/2023   Left testicular pain 05/13/2023   Generalized weakness 05/13/2023   Cellulitis of scrotum 05/13/2023   Cerebellar stroke, acute ([name]) 05/13/2023   Cellulitis 05/12/2023   Cellulitis of groin 05/12/2023   Carotid stenosis, symptomatic,  with infarction ([name]) 06/05/2022   History of colonic polyps    Anxiety state 11/17/2021   Cognitive deficit, post-stroke 11/17/2021   Ex-smoker 11/17/2021   Bradycardia 02/03/2021   Chronic systolic CHF (congestive heart failure), NYHA class 2 ([name]) 05/27/2020   Accelerated junctional rhythm 04/21/2019   Encounter for screening colonoscopy    Polyp of colon    CAD (coronary artery disease) 01/14/2019   Depression 01/14/2019   GERD (gastroesophageal reflux disease) 01/14/2019   Periodic limb movement disorder (PLMD) 01/14/2019   Sleep apnea 01/14/2019   Type 2 diabetes mellitus without complications ([name]) 01/14/2019   Stricture and stenosis of esophagus    Problems with swallowing and mastication    Food impaction of esophagus    H/O coronary artery bypass surgery 01/17/2016   Cerebrovascular accident (CVA) due to embolism ([name]) 01/03/2016   Hypothyroidism, unspecified 01/03/2016   HLD (hyperlipidemia) 01/03/2016   Stable angina ([name]) 11/29/2015   Benign essential hypertension 05/27/2015   Bilateral carotid artery stenosis 02/24/2015   Atherosclerotic peripheral vascular disease ([name]) 09/07/2014   Bladder calculus 08/12/2014  Primary osteoarthritis of left knee 03/10/2014   Elevated prostate specific antigen (PSA) 02/05/2013   Snoring 01/22/2013   Sleepiness 01/22/2013   Dysphagia 01/22/2013   Occlusion and stenosis of carotid artery without mention of cerebral infarction 01/22/2013   Cerebral thrombosis with cerebral infarction ([name]) 01/22/2013   Benign prostatic hyperplasia with incomplete bladder emptying 08/26/2012   Chronic prostatitis 08/26/2012   Family history of malignant neoplasm of prostate 08/26/2012   Incomplete emptying of bladder 08/26/2012   Dysphagia 05/14/2012   Global aphasia 05/14/2012   Stroke, acute, thrombotic ([name]) 05/12/2012   Symptomatic carotid artery stenosis 05/12/2012   HTN (hypertension) 05/12/2012   COPD (chronic obstructive pulmonary  disease) ([name]) 05/12/2012   Acute respiratory failure ([name]) 05/12/2012    FROM INITIAL EVALUATION PCP: Myrene Buddy, NP  REFERRING PROVIDER: Myrene Buddy, *   REFERRING DIAG: I63.10 (ICD-10-CM) - Cerebral infarction due to embolism of unspecified precerebral artery S06.9XAA (ICD-10-CM) - Unspecified intracranial injury with loss of consciousness status unknown, initial encounter R47.01 (ICD-10-CM) - Aphasia S01.90XA (ICD-10-CM) - Unspecified open wound of unspecified part of head, initial encounter  RATIONALE FOR EVALUATION AND TREATMENT: Rehabilitation  THERAPY DIAG: Unsteadiness on feet  Muscle weakness (generalized)  ONSET DATE: 05/12/23  FOLLOW-UP APPT SCHEDULED WITH REFERRING PROVIDER: Yes    SUBJECTIVE:                                                                                                                                                                                         SUBJECTIVE STATEMENT:   Severe imbalance with R sided weakness;   PERTINENT HISTORY:  Pt referred to PT for unsteadiness s/p CVA. He was hospitalized at [facility] on 05/12/23-05/15/23 for chills, weakness, and left-sided testicular pain for several days. Noted baseline history of CVA with right-sided deficits. This was acutely worsened w/ his other sx. Urinalysis mildly indicative of infection. Troponin 19-28. COVID flu and RSV negative. Lactate within normal limits. CT head- negative acute intracranial abnormality. Old left MCA infarct with left frontal encephalomalacia (R sided weakness). Atrophy, chronic microvascular disease. Brain MRI read as punctate acute or subacute infarct in the right superior cerebellum. No associated hemorrhage or mass effect. Scrotal ultrasound showed bilateral hydroceles, simple on the right and complex (multiseptated) left, bilateral varicoceles, epididymides are enlarged and heterogeneous in appearance bilaterally, with multiple epididymal cysts and/or  spermatoceles, and multiple calcifications. No evidence of testicular torsion, or findings suggestive of epididymo-orchitis.Initially treated with IV rocephin, flagyl, vancomycin as well as topical miconazole cream. Neurology and urology were consulted and was admitted for further wrkp/management. Neurology did not recommend any further neurological workup and he was asymptomatic. Additionally,  patient was found to have significantly elevated blood pressures throughout his stay and was not on home therapy. He was started on entresto given his CAD and HFpEF following permissive hypertensive period however he was taken off after his recent cardiology visit. Pt has had 3 falls since returning home from the hospital. He had a notable worsening of his balance with recent CVA. Recently finished speech therapy.   Pain: No Numbness/Tingling: No Focal Weakness: Yes, RUE/RLE weakness, mild R facial weakness Recent changes in overall health/medication: Yes Prior history of physical therapy for balance:  Yes, 2013 and L MCA infarct Dominant hand: right Imaging: Yes  Red flags: Negative for bowel/bladder changes, personal history of cancer, abdominal pain, chills/fever, night sweats, nausea, vomiting, unrelenting pain,  PRECAUTIONS: None  WEIGHT BEARING RESTRICTIONS: No  FALLS: Has patient fallen in last 6 months? Yes. 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   Patient Goals: Pt wants to be able to work in the yard, strengthen his R side, and improve his balance.     OBJECTIVE:   Patient Surveys  FOTO: 50, predicted improvement to 56   Cognition Patient is oriented to person, place, and time.  Recent memory is intact.  Remote memory is intact.  Attention span and concentration are intact.  Expressive aphasia noted; Patient's fund of knowledge is within normal limits for educational level.    Gross Musculoskeletal Assessment Tremor: None Bulk: Normal Tone: Normal  Posture: No gross abnormalities noted in standing or seated posture  AROM No gross deficits noted with functional activities  LE MMT: MMT (out of 5) Right  Left   Hip flexion 4+ 4+  Hip extension    Hip abduction (seated) 5 5  Hip adduction (seated) 5 5  Hip internal rotation 5 5  Hip external rotation 5 5  Knee flexion 5 5  Knee extension 5 5  Ankle dorsiflexion 5 5  Ankle plantarflexion Active Active  Ankle inversion    Ankle eversion    (* = pain; Blank rows = not tested)  Slight decrease in R shoulder flexion and R grip strength noted compared to left side  Sensation Grossly intact to light touch throughout bilateral LEs as determined by testing dermatomes L2-S2. Proprioception, stereognosis, and hot/cold testing deferred on this date.  Reflexes Deferred  Cranial Nerves Visual acuity and visual fields are intact  Extraocular muscles are intact  Facial sensation is intact bilaterally  Facial strength demonstrates slight droop in R corner of mouth  Hearing is normal as tested by gross conversation Palate elevates midline, normal phonation  Shoulder shrug strength is intact  Tongue protrudes midline  Coordination/Cerebellar Finger to Nose: Mildly dysmetric RUE Heel to Shin: Mildly dysmetric RLE Rapid alternating movements: WNL Finger Opposition: More difficult R hand Pronator Drift: Negative  Bed mobility: Deferred  Transfers: Assistive device utilized: None  Sit to stand: Complete Independence Stand to sit: Complete Independence Chair to  chair: Complete Independence Floor:  Not tested  Curb:  Not tested  Stairs: Level of Assistance: Modified independence Stair Negotiation Technique: Step to Pattern with Bilateral  Rails Number of Stairs: 4  Height of Stairs: 6"  Comments: Utilized step-to pattern during descent only;  Gait: Gait pattern: decreased step length- Left and decreased stance time- Right Distance walked: 100 Assistive device utilized: None Level of assistance: Complete Independence Comments: Slight decrease stance time RLE, decreased self-selected speed  Functional Outcome Measures  Results Comments  BERG 49/56   DGI    FGA    TUG 9.5 seconds   5TSTS 11.8 seconds   6 Minute Walk Test    10 Meter Gait Speed    (Blank rows = not tested)   TODAY'S TREATMENT (07/26/23):      SUBJECTIVE: Pt reports that he is doing well today. No changes since the last therapy session. No pain reported. No specific questions or concerns.    PAIN: None reported   Ther-ex  NuStep L1-4 x 10 minutes for warm-up and BLE strengthening during interval history, therapist adjusting resistance and monitoring fatigue to ensure proper difficulty (3 minutes unbilled); Sit to stand with overhead 8# med ball press 2 x 10;  Standing hip strengthening with 7.5# ankle weights: Hip flexion marches x 45s BLE; HS curls x 45s BLE; Hip abduction x 45s BLE; Hip extension x 45s BLE;   Neuromuscular Re-education  Obstacle course with 6" hurdles and Airex pad step up/down in // bars without UE support x multiple lengths; Forward gait in // bars without UE support with 6" lateral cone taps; Tandem balance alternating forward LE x 30s each; Tandem gait in // bars without UE support x multiple lengths;   Not performed: 6" lateral step-downs x 10 BLE; Nautilus resisted gait 40# forward x 3 laps, R lateral, and L lateral x 2 each direction; Step ups onto airex pad x 10 leading with each LE   Standing semi tandem with one foot on  airex and other on 6" step 2 x 30 seconds leading with each LE  Tandem balance alternating forward LE horizontal and vertical head turns x 30s each Blaze pods with random setting, 5 pods, 2 cycles, 60s/cycle. Star toe taps from 9 to 3 o'clock; Blaze pods with random setting, 5 pods with 4 distracting colors, 2 cycles, 60s/cycle. Star toe taps from 9 to 3 o'clock, significantly more difficult for patient due to the cognitive challenge; Standing heel/toe raises with BUE support x 10 each; 6" forward step-ups without UE support x 10 BLE; Total Gym (TG) Level 22 (L22) double leg squats x 15; TG L22 single leg squats 2 x 10 BLE;  Seated LAQ with 5# ankle weights x 30s BLE; Forward high knee marching in // bars with contralateral hand to knee 2 x 2 lengths (motor planning very difficult for patient); Cross-over stepping in // bars with faded UE support x 4 lengths; Airex static balance eyes open/closed x 30s each; Airex feet apart ball passes around body to therapist with return pass on opposite side x multiple bouts on each side; Airex alternating 6" step taps without UE support x 10 BLE;    PATIENT EDUCATION:  Education details: Pt educated throughout session about proper posture and technique with exercises. Improved exercise technique, movement at target joints, use of target muscles after min to mod verbal, visual, tactile cues. Balance exercises. Person educated: Patient Education method: Explanation Education comprehension: verbalized understanding   HOME EXERCISE PROGRAM:  Access Code: [email or web address] URL: [email or web address] Date: 07/03/2023 Prepared by: Ria Comment  Exercises - Seated March with Resistance  - 1 x daily - 7 x weekly -  3 sets - 30s hold - Seated Hip Adduction Isometrics with Ball  - 1 x daily - 7 x weekly - 3 sets - 30s hold - Seated Hip Abduction with Resistance  - 1 x daily - 7 x weekly - 3 sets - 30s hold - Seated Long Arc Quad  - 1 x daily - 7 x  weekly - 3 sets - 30s hold - Mini Squat with Counter Support  - 1 x daily - 7 x weekly - 3 sets - 30s hold - Heel Raises with Counter Support  - 1 x daily - 7 x weekly - 3 sets - 30s hold   ASSESSMENT:  CLINICAL IMPRESSION:   Patient arrives to treatment session motivated to participate. Session focused on BLE strengthening and balance. Increased ankle weights to 7.5#. Gait belt donned throughout session. He continues to struggle with motor planning as well as aphasia making feedback difficult. No HEP updates today. Patient will continue to benefit from skilled therapy to address remaining deficits in order to improve quality of life and return to prior level of function.  OBJECTIVE IMPAIRMENTS: Abnormal gait, decreased balance, decreased coordination, difficulty walking, and decreased strength.   ACTIVITY LIMITATIONS: carrying, lifting, bending, squatting, stairs, and transfers  PARTICIPATION LIMITATIONS: meal prep, cleaning, shopping, community activity, and yard work  PERSONAL FACTORS: Age and 3+ comorbidities: aphasia, CAD, carotid stenosis, prior CVA, CHF, and COPD  are also affecting patient's functional outcome.   REHAB POTENTIAL: Good  CLINICAL DECISION MAKING: Unstable/unpredictable  EVALUATION COMPLEXITY: High   GOALS: Goals reviewed with patient? No  SHORT TERM GOALS: Target date: 08/10/2023  Pt will be independent with HEP in order to improve strength and balance in order to decrease fall risk and improve function at home. Baseline:  Goal status: INITIAL   LONG TERM GOALS: Target date: 08/16/2023  Pt will increase FOTO to at least 62 to demonstrate significant improvement in function at home related to balance  Baseline: 50 Goal status: INITIAL  2.  Pt will improve BERG by at least 3 points in order to demonstrate clinically significant improvement in balance.   Baseline: 06/21/23: 49/56 Goal status: INITIAL  3. Pt will improve DGI by at least 3 points in order  to demonstrate clinically significant improvement in balance and decreased risk for falls.     Baseline: 14/24 Goal status: INITIAL  4. Pt will increase by at least 31m (136ft) in order to demonstrate clinically significant improvement in cardiopulmonary endurance and community ambulation   Baseline: 23' without assistive device. Occasional R foot shuffling Goal status: INITIAL   PLAN: PT FREQUENCY: 2x/week  PT DURATION: 8 weeks  PLANNED INTERVENTIONS: Therapeutic exercises, Therapeutic activity, Neuromuscular re-education, Balance training, Gait training, Patient/Family education, Self Care, Joint mobilization, Joint manipulation, Vestibular training, Canalith repositioning, Orthotic/Fit training, DME instructions, Dry Needling, Electrical stimulation, Spinal manipulation, Spinal mobilization, Cryotherapy, Moist heat, Taping, Traction, Ultrasound, Ionotophoresis 4mg /ml Dexamethasone, Manual therapy, and Re-evaluation.  PLAN FOR NEXT SESSION: Progress balance and strengthening exercises, modify/review HEP (add balance exercises) as necessary;  Lynnea Maizes PT, DPT, GCS  Kartier Bennison, PT, DPT  07/26/2023, 3:35 PM

## 2023-07-27 DIAGNOSIS — I5022 Chronic systolic (congestive) heart failure: Secondary | ICD-10-CM | POA: Diagnosis not present

## 2023-07-27 NOTE — Therapy (Signed)
 OUTPATIENT PHYSICAL THERAPY BALANCE TREATMENT/PROGRESS NOTE  Dates of reporting period  06/21/23   to   07/31/23    Patient Name: Paul Bass MRN: [id number] DOB:08/17/1945, 77 y.o., male Today's Date: 07/31/2023  END OF SESSION:  PT End of Session - 07/31/23 1354     Visit Number 10    Number of Visits 17    Date for PT Re-Evaluation 08/16/23    Authorization Type eval: 06/21/23    PT Start Time 1400    PT Stop Time 1445    PT Time Calculation (min) 45 min    Equipment Utilized During Treatment Gait belt    Activity Tolerance Patient tolerated treatment well            Past Medical History:  Diagnosis Date   Accelerated junctional rhythm    Anemia    Anxiety    a.) Tx'd with BZO PRN   Arthritis    Bilateral carotid artery disease ([name])    BPH (benign prostatic hypertrophy)    CAD S/P percutaneous coronary angioplasty    a.) PCI in 2002 placing a RCA stent (unknown type). b.) LHC 12/07/2015: 5% ISR m-dRCA, 90% dRCA, 25% pRCA, 25% p-mLCx, 100% OM3, 95% oOM2-OM2, 75% m-dLAD; refer to CVTS. c.) 3v CABG 01/03/2016   CHF (congestive heart failure) ([name])    a.) TTE 05/12/2012: EF 50-55%; mild LA enlargement; G1DD. b.)  TTE 11/26/2015: EF 45%; mild BAE; triv PR, mild MR/TR; inferolateral HK; G1DD. c.)  TTE 11/07/2017: EF 35%; RV enlargement; BAE; inferior and inferolateral HK; triv PR; mild MR/TR. d.)  TTE 09/17/2019: EF 35%; mild LVH; triv MR/TR. e.)  TTE 08/11/2021: EF 50%; triv MR/TR; G1DD.   Cognitive deficit following cerebrovascular accident (CVA)    COPD (chronic obstructive pulmonary disease) ([name])    Cortical cataract    Depression    Dyspnea    GERD (gastroesophageal reflux disease)    History of 2019 novel coronavirus disease (COVID-19) 01/19/2021   History of acute inferior wall MI 2002   a.) PCI was performed placing a RCA stent (unknown type)   History of kidney stones    HTN (hypertension)    Hyperlipidemia    Hypothyroidism    Long term current use of  antithrombotics/antiplatelets    a.) DAPT therapy (ASA + clopidogrel )   OSA (obstructive sleep apnea)    a.) does not require nocturnal PAP therapy   PLMD (periodic limb movement disorder)    Postoperative atrial fibrillation ([name]) 01/03/2016   a.) following CABG procedure   Prostatitis    PVD (peripheral vascular disease) ([name])    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 b/) residual RIGHT sided weakness   T2DM (type 2 diabetes mellitus) ([name])    Tobacco abuse    Past Surgical History:  Procedure Laterality Date   CARDIAC CATHETERIZATION N/A 12/07/2015   Procedure: Left Heart Cath and Coronary Angiography;  Surgeon: Wolm JINNY Rhyme, MD;  Location: [facility];  Service: Cardiovascular;  Laterality: N/A;   CAROTID ANGIOGRAPHY N/A 06/05/2022   Procedure: CAROTID ANGIOGRAPHY;  Surgeon: Marea Selinda RAMAN, MD;  Location: [facility];  Service: Cardiovascular;  Laterality: N/A;   CAROTID PTA/STENT INTERVENTION Left    HX: 2 stents  CATARACT EXTRACTION W/PHACO Left 03/24/2021   Procedure: CATARACT EXTRACTION PHACO AND INTRAOCULAR LENS PLACEMENT (IOC) LEFT DIABETIC 6.67 00:42.5;  Surgeon: Jaye Fallow, MD;  Location: [facility];  Service: Ophthalmology;  Laterality: Left;   COLONOSCOPY WITH PROPOFOL  N/A 02/18/2019   Procedure: COLONOSCOPY WITH PROPOFOL ;  Surgeon: Jinny Carmine, MD;  Location: [facility];  Service: Endoscopy;  Laterality: N/A;   COLONOSCOPY WITH PROPOFOL  N/A 12/22/2021   Procedure: COLONOSCOPY WITH PROPOFOL ;  Surgeon: Jinny Carmine, MD;  Location[facility];  Service: Endoscopy;  Laterality: N/A;   CORONARY ANGIOPLASTY WITH STENT PLACEMENT Left 2002   CORONARY ARTERY BYPASS GRAFT N/A 01/03/2016   Procedure: 3v CORONARY  ARTERY BYPASS GRAFT (LIMA-LAD, SVG-PDA, SVG-OM2); Location: [facility]; Surgeon: Debby Nova, MD   CYSTOSCOPY W/ URETERAL STENT REMOVAL N/A 10/04/2021   Procedure: CYSTOSCOPY WITH PROSTATE FOREIGN BODY REMOVAL;  Surgeon: Twylla Glendia BROCKS, MD;  Location: [facility];  Service: Urology;  Laterality: N/A;   CYSTOSCOPY WITH INSERTION OF UROLIFT N/A 04/29/2019   Procedure: CYSTOSCOPY WITH INSERTION OF UROLIFT;  Surgeon: Twylla Glendia BROCKS, MD;  Location: [facility];  Service: Urology;  Laterality: N/A;   ESOPHAGOGASTRODUODENOSCOPY (EGD) WITH PROPOFOL  N/A 09/11/2016   Procedure: ESOPHAGOGASTRODUODENOSCOPY (EGD) WITH PROPOFOL ;  Surgeon: Carmine Jinny, MD;  Location: [facility];  Service: Endoscopy;  Laterality: N/A;   ESOPHAGOGASTRODUODENOSCOPY (EGD) WITH PROPOFOL  N/A 10/17/2016   Procedure: ESOPHAGOGASTRODUODENOSCOPY (EGD) WITH PROPOFOL ;  Surgeon: Carmine Jinny, MD;  Location: [facility];  Service: Endoscopy;  Laterality: N/A;   ESOPHAGOGASTRODUODENOSCOPY (EGD) WITH PROPOFOL  N/A 11/13/2017   Procedure: ESOPHAGOGASTRODUODENOSCOPY (EGD) WITH PROPOFOL ;  Surgeon: Jinny Carmine, MD;  Location: [facility];  Service: Endoscopy;  Laterality: N/A;   pins R lower leg Left    rotator cuff replaced Right    SAVORY DILATION N/A 10/17/2016   Procedure: SAVORY DILATION;  Surgeon: Carmine Jinny, MD;  Location: [facility];  Service: Endoscopy;  Laterality: N/A;   TRANSURETHRAL INCISION OF PROSTATE N/A 10/04/2021   Procedure: TRANSURETHRAL INCISION OF THE PROSTATE (TUIP);  Surgeon: Twylla Glendia BROCKS, MD;  Location: [facility];  Service: Urology;  Laterality: N/A;   Patient Active Problem List   Diagnosis Date Noted   Acute cystitis without hematuria 05/15/2023   Pseudomonas infection 05/15/2023   Pseudomonas urinary tract infection 05/14/2023   Left testicular pain 05/13/2023   Generalized weakness 05/13/2023   Cellulitis of scrotum 05/13/2023   Cerebellar stroke, acute ([name]) 05/13/2023   Cellulitis 05/12/2023    Cellulitis of groin 05/12/2023   Carotid stenosis, symptomatic, with infarction ([name]) 06/05/2022   History of colonic polyps    Anxiety state 11/17/2021   Cognitive deficit, post-stroke 11/17/2021   Ex-smoker 11/17/2021   Bradycardia 02/03/2021   Chronic systolic CHF (congestive heart failure), NYHA class 2 ([name]) 05/27/2020   Accelerated junctional rhythm 04/21/2019   Encounter for screening colonoscopy    Polyp of colon    CAD (coronary artery disease) 01/14/2019   Depression 01/14/2019   GERD (gastroesophageal reflux disease) 01/14/2019   Periodic limb movement disorder (PLMD) 01/14/2019   Sleep apnea 01/14/2019   Type 2 diabetes mellitus without complications ([name]) 01/14/2019   Stricture and stenosis of esophagus    Problems with swallowing and mastication    Food impaction of esophagus    H/O coronary artery bypass surgery 01/17/2016   Cerebrovascular accident (CVA) due to embolism ([name]) 01/03/2016   Hypothyroidism, unspecified 01/03/2016   HLD (hyperlipidemia) 01/03/2016   Stable angina ([name]) 11/29/2015   Benign essential hypertension 05/27/2015   Bilateral carotid artery stenosis 02/24/2015  Atherosclerotic peripheral vascular disease ([name]) 09/07/2014   Bladder calculus 08/12/2014   Primary osteoarthritis of left knee 03/10/2014   Elevated prostate specific antigen (PSA) 02/05/2013   Snoring 01/22/2013   Sleepiness 01/22/2013   Dysphagia 01/22/2013   Occlusion and stenosis of carotid artery without mention of cerebral infarction 01/22/2013   Cerebral thrombosis with cerebral infarction ([name]) 01/22/2013   Benign prostatic hyperplasia with incomplete bladder emptying 08/26/2012   Chronic prostatitis 08/26/2012   Family history of malignant neoplasm of prostate 08/26/2012   Incomplete emptying of bladder 08/26/2012   Dysphagia 05/14/2012   Global aphasia 05/14/2012   Stroke, acute, thrombotic ([name]) 05/12/2012   Symptomatic carotid artery stenosis 05/12/2012   HTN  (hypertension) 05/12/2012   COPD (chronic obstructive pulmonary disease) ([name]) 05/12/2012   Acute respiratory failure ([name]) 05/12/2012    FROM INITIAL EVALUATION PCP: Don Lauraine Collar, NP  REFERRING PROVIDER: Don Lauraine Collar, *   REFERRING DIAG: I63.10 (ICD-10-CM) - Cerebral infarction due to embolism of unspecified precerebral artery S06.9XAA (ICD-10-CM) - Unspecified intracranial injury with loss of consciousness status unknown, initial encounter R47.01 (ICD-10-CM) - Aphasia S01.90XA (ICD-10-CM) - Unspecified open wound of unspecified part of head, initial encounter  RATIONALE FOR EVALUATION AND TREATMENT: Rehabilitation  THERAPY DIAG: Unsteadiness on feet  Muscle weakness (generalized)  ONSET DATE: 05/12/23  FOLLOW-UP APPT SCHEDULED WITH REFERRING PROVIDER: Yes    SUBJECTIVE:                                                                                                                                                                                         SUBJECTIVE STATEMENT:   Severe imbalance with R sided weakness;   PERTINENT HISTORY:  Pt referred to PT for unsteadiness s/p CVA. He was hospitalized at [facility] on 05/12/23-05/15/23 for chills, weakness, and left-sided testicular pain for several days. Noted baseline history of CVA with right-sided deficits. This was acutely worsened w/ his other sx. Urinalysis mildly indicative of infection. Troponin 19-28. COVID flu and RSV negative. Lactate within normal limits. CT head- negative acute intracranial abnormality. Old left MCA infarct with left frontal encephalomalacia (R sided weakness). Atrophy, chronic microvascular disease. Brain MRI read as punctate acute or subacute infarct in the right superior cerebellum. No associated hemorrhage or mass effect. Scrotal ultrasound showed bilateral hydroceles, simple on the right and complex (multiseptated) left, bilateral varicoceles, epididymides are enlarged and heterogeneous in  appearance bilaterally, with multiple epididymal cysts and/or spermatoceles, and multiple calcifications. No evidence of testicular torsion, or findings suggestive of epididymo-orchitis.Initially treated with IV rocephin , flagyl , vancomycin  as well as topical miconazole  cream. Neurology and urology were consulted and was admitted for further wrkp/management.  Neurology did not recommend any further neurological workup and he was asymptomatic. Additionally, patient was found to have significantly elevated blood pressures throughout his stay and was not on home therapy. He was started on entresto  given his CAD and HFpEF following permissive hypertensive period however he was taken off after his recent cardiology visit. Pt has had 3 falls since returning home from the hospital. He had a notable worsening of his balance with recent CVA. Recently finished speech therapy.   Pain: No Numbness/Tingling: No Focal Weakness: Yes, RUE/RLE weakness, mild R facial weakness Recent changes in overall health/medication: Yes Prior history of physical therapy for balance:  Yes, 2013 and L MCA infarct Dominant hand: right Imaging: Yes  Red flags: Negative for bowel/bladder changes, personal history of cancer, abdominal pain, chills/fever, night sweats, nausea, vomiting, unrelenting pain,  PRECAUTIONS: None  WEIGHT BEARING RESTRICTIONS: No  FALLS: Has patient fallen in last 6 months? Yes. 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   Patient Goals: Pt wants to be able to work  in the yard, strengthen his R side, and improve his balance.    OBJECTIVE:   Patient Surveys  FOTO: 50, predicted improvement to 30   Cognition Patient is oriented to person, place, and time.  Recent memory is intact.  Remote memory is intact.  Attention span and concentration are intact.  Expressive aphasia noted; Patient's fund of knowledge is within normal limits for educational level.    Gross Musculoskeletal Assessment Tremor: None Bulk: Normal Tone: Normal  Posture: No gross abnormalities noted in standing or seated posture  AROM No gross deficits noted with functional activities  LE MMT: MMT (out of 5) Right  Left   Hip flexion 4+ 4+  Hip extension    Hip abduction (seated) 5 5  Hip adduction (seated) 5 5  Hip internal rotation 5 5  Hip external rotation 5 5  Knee flexion 5 5  Knee extension 5 5  Ankle dorsiflexion 5 5  Ankle plantarflexion Active Active  Ankle inversion    Ankle eversion    (* = pain; Blank rows = not tested)  Slight decrease in R shoulder flexion and R grip strength noted compared to left side  Sensation Grossly intact to light touch throughout bilateral LEs as determined by testing dermatomes L2-S2. Proprioception, stereognosis, and hot/cold testing deferred on this date.  Reflexes Deferred  Cranial Nerves Visual acuity and visual fields are intact  Extraocular muscles are intact  Facial sensation is intact bilaterally  Facial strength demonstrates slight droop in R corner of mouth  Hearing is normal as tested by gross conversation Palate elevates midline, normal phonation  Shoulder shrug strength is intact  Tongue protrudes midline  Coordination/Cerebellar Finger to Nose: Mildly dysmetric RUE Heel to Shin: Mildly dysmetric RLE Rapid alternating movements: WNL Finger Opposition: More difficult R hand Pronator Drift: Negative  Bed mobility: Deferred  Transfers: Assistive device utilized: None  Sit to stand: Complete  Independence Stand to sit: Complete Independence Chair to chair: Complete Independence Floor:  Not tested  Curb:  Not tested  Stairs:  Level of Assistance: Modified independence Stair Negotiation Technique: Step to Pattern with Bilateral Rails Number of Stairs: 4  Height of Stairs: 6  Comments: Utilized step-to pattern during descent only;  Gait: Gait pattern: decreased step length- Left and decreased stance time- Right Distance walked: 100 Assistive device utilized: None Level of assistance: Complete Independence Comments: Slight decrease stance time RLE, decreased self-selected speed  Functional Outcome Measures  Results Comments  BERG 49/56   DGI    FGA    TUG 9.5 seconds   5TSTS 11.8 seconds   6 Minute Walk Test    10 Meter Gait Speed    (Blank rows = not tested)   TODAY'S TREATMENT (07/31/23):      SUBJECTIVE: Pt reports that he is doing well today. No changes since the last therapy session. No pain reported. No specific questions or concerns.    PAIN: None reported    Neuromuscular Re-education  NuStep L1-4 x 10 minutes for warm-up and BLE strengthening during interval history, therapist adjusting resistance and monitoring fatigue to ensure proper difficulty (3 minutes unbilled);  Updated outcome measures: FOTO: 46 BERG: 51/56 DGI: 17/24; : 940' without assistive device, occasional R foot shuffling; Pre-vitals: Seated: BP: 149/67 mmHg, HR: 60 bpm, SpO2: 99% Post-vitals: Seated: BP: 145/64 mmHg, HR: 110 bpm, SpO2: 96%   Ther-ex  Standing hip strengthening with 7.5# ankle weights: Hip flexion marches x 45s BLE; HS curls x 45s BLE; Hip abduction x 45s BLE; Hip extension x 45s BLE;   Not performed: 6 lateral step-downs x 10 BLE; Nautilus resisted gait 40# forward x 3 laps, R lateral, and L lateral x 2 each direction; Step ups onto airex pad x 10 leading with each LE   Standing semi tandem with one foot on airex and other on 6 step 2 x 30  seconds leading with each LE  Tandem balance alternating forward LE horizontal and vertical head turns x 30s each Blaze pods with random setting, 5 pods, 2 cycles, 60s/cycle. Star toe taps from 9 to 3 o'clock; Blaze pods with random setting, 5 pods with 4 distracting colors, 2 cycles, 60s/cycle. Star toe taps from 9 to 3 o'clock, significantly more difficult for patient due to the cognitive challenge; Standing heel/toe raises with BUE support x 10 each; 6 forward step-ups without UE support x 10 BLE; Total Gym (TG) Level 22 (L22) double leg squats x 15; TG L22 single leg squats 2 x 10 BLE;  Seated LAQ with 5# ankle weights x 30s BLE; Forward high knee marching in // bars with contralateral hand to knee 2 x 2 lengths (motor planning very difficult for patient); Cross-over stepping in // bars with faded UE support x 4 lengths; Airex static balance eyes open/closed x 30s each; Airex feet apart ball passes around body to therapist with return pass on opposite side x multiple bouts on each side; Airex alternating 6 step taps without UE support x 10 BLE; Sit to stand with overhead 8# med ball press 2 x 10; Obstacle course with 6 hurdles and Airex pad step up/down in // bars without UE support x multiple lengths; Forward gait in // bars without UE support with 6 lateral cone taps; Tandem balance alternating forward LE x 30s each; Tandem gait in // bars without UE support x multiple lengths;     PATIENT EDUCATION:  Education details: Outcome measures. Pt educated throughout session about proper posture and technique with exercises. Improved exercise technique, movement at target joints, use of target  muscles after min to mod verbal, visual, tactile cues.  Person educated: Patient Education method: Explanation Education comprehension: verbalized understanding   HOME EXERCISE PROGRAM:  Access Code: [email or web address] URL: [email or web address] Date: 07/03/2023 Prepared by: Selinda Eck  Exercises - Seated March with Resistance  - 1 x daily - 7 x weekly - 3 sets - 30s hold - Seated Hip Adduction Isometrics with Ball  - 1 x daily - 7 x weekly - 3 sets - 30s hold - Seated Hip Abduction with Resistance  - 1 x daily - 7 x weekly - 3 sets - 30s hold - Seated Long Arc Quad  - 1 x daily - 7 x weekly - 3 sets - 30s hold - Mini Squat with Counter Support  - 1 x daily - 7 x weekly - 3 sets - 30s hold - Heel Raises with Counter Support  - 1 x daily - 7 x weekly - 3 sets - 30s hold   ASSESSMENT:  CLINICAL IMPRESSION:   Updated outcome measures with patient during visit today. His BERG, DGI, and all improved. Unfortunately his subjective appraisal worsened as demonstrated by a lower FOTO score today. Overall pt is making excellent progress toward his goals. His endurance and balance have improved since the initial evaluation. Additional time during session today spent on strengthening. No HEP updates today. Plan to progress strengthening and balance exercises Patient will continue to benefit from skilled therapy to address remaining deficits in order to improve quality of life and return to prior level of function.  OBJECTIVE IMPAIRMENTS: Abnormal gait, decreased balance, decreased coordination, difficulty walking, and decreased strength.   ACTIVITY LIMITATIONS: carrying, lifting, bending, squatting, stairs, and transfers  PARTICIPATION LIMITATIONS: meal prep, cleaning, shopping, community activity, and yard work  PERSONAL FACTORS: Age and 3+ comorbidities: aphasia, CAD, carotid stenosis, prior CVA, CHF, and COPD  are also affecting patient's functional outcome.   REHAB POTENTIAL: Good  CLINICAL DECISION MAKING: Unstable/unpredictable  EVALUATION COMPLEXITY: High   GOALS: Goals reviewed with patient? No  SHORT TERM GOALS: Target date: 08/10/2023  Pt will be independent with HEP in order to improve strength and balance in order to decrease fall risk and improve  function at home. Baseline:  Goal status: INITIAL   LONG TERM GOALS: Target date: 08/16/2023  Pt will increase FOTO to at least 62 to demonstrate significant improvement in function at home related to balance  Baseline: 50; 07/31/23: 46 Goal status: ONGOING  2.  Pt will improve BERG by at least 3 points in order to demonstrate clinically significant improvement in balance.   Baseline: 06/21/23: 49/56, 07/31/23: 51/56;  Goal status: PARTIALLY MET  3. Pt will improve DGI by at least 3 points in order to demonstrate clinically significant improvement in balance and decreased risk for falls.     Baseline: 14/24, 07/31/23: 17/24; Goal status: ACHIEVED  4. Pt will increase by at least 46m (112ft) in order to demonstrate clinically significant improvement in cardiopulmonary endurance and community ambulation   Baseline: 54' without assistive device. Occasional R foot shuffling, 07/31/23: 940' without assistive device, occasional R foot shuffling; Goal status: PARTIALLY MET   PLAN: PT FREQUENCY: 2x/week  PT DURATION: 8 weeks  PLANNED INTERVENTIONS: Therapeutic exercises, Therapeutic activity, Neuromuscular re-education, Balance training, Gait training, Patient/Family education, Self Care, Joint mobilization, Joint manipulation, Vestibular training, Canalith repositioning, Orthotic/Fit training, DME instructions, Dry Needling, Electrical stimulation, Spinal manipulation, Spinal mobilization, Cryotherapy, Moist heat, Taping, Traction, Ultrasound, Ionotophoresis 4mg /ml Dexamethasone , Manual therapy, and Re-evaluation.  PLAN FOR NEXT SESSION: Progress balance and strengthening exercises, modify/review HEP (add balance exercises) as necessary;  Selinda JONETTA Eck PT, DPT, GCS  Jadie Allington, PT, DPT  07/31/2023, 6:07 PM

## 2023-07-31 ENCOUNTER — Ambulatory Visit: Payer: Medicare HMO

## 2023-07-31 DIAGNOSIS — R2681 Unsteadiness on feet: Secondary | ICD-10-CM

## 2023-07-31 DIAGNOSIS — M6281 Muscle weakness (generalized): Secondary | ICD-10-CM | POA: Diagnosis not present

## 2023-08-02 ENCOUNTER — Ambulatory Visit: Payer: Medicare HMO | Attending: Nurse Practitioner

## 2023-08-02 DIAGNOSIS — R2681 Unsteadiness on feet: Secondary | ICD-10-CM | POA: Diagnosis not present

## 2023-08-02 DIAGNOSIS — M6281 Muscle weakness (generalized): Secondary | ICD-10-CM | POA: Diagnosis not present

## 2023-08-02 NOTE — Therapy (Signed)
 OUTPATIENT PHYSICAL THERAPY BALANCE TREATMENT   Patient Name: Paul Bass MRN: [id number] DOB:[birth year], 78 y.o., male Today's Date: 08/03/2023  END OF SESSION:  PT End of Session - 08/02/23 1401     Visit Number 11    Number of Visits 17    Date for PT Re-Evaluation 08/16/23    Authorization Type eval: 06/21/23    PT Start Time 1401    PT Stop Time 1445    PT Time Calculation (min) 44 min    Equipment Utilized During Treatment Gait belt    Activity Tolerance Patient tolerated treatment well            Past Medical History:  Diagnosis Date   Accelerated junctional rhythm    Anemia    Anxiety    a.) Tx'd with BZO PRN   Arthritis    Bilateral carotid artery disease ([name])    BPH (benign prostatic hypertrophy)    CAD S/P percutaneous coronary angioplasty    a.) PCI in 2002 placing a RCA stent (unknown type). b.) LHC 12/07/2015: 5% ISR m-dRCA, 90% dRCA, 25% pRCA, 25% p-mLCx, 100% OM3, 95% oOM2-OM2, 75% m-dLAD; refer to CVTS. c.) 3v CABG 01/03/2016   CHF (congestive heart failure) ([name])    a.) TTE 05/12/2012: EF 50-55%; mild LA enlargement; G1DD. b.)  TTE 11/26/2015: EF 45%; mild BAE; triv PR, mild MR/TR; inferolateral HK; G1DD. c.)  TTE 11/07/2017: EF 35%; RV enlargement; BAE; inferior and inferolateral HK; triv PR; mild MR/TR. d.)  TTE 09/17/2019: EF 35%; mild LVH; triv MR/TR. e.)  TTE 08/11/2021: EF 50%; triv MR/TR; G1DD.   Cognitive deficit following cerebrovascular accident (CVA)    COPD (chronic obstructive pulmonary disease) ([name])    Cortical cataract    Depression    Dyspnea    GERD (gastroesophageal reflux disease)    History of 2019 novel coronavirus disease (COVID-19) 01/19/2021   History of acute inferior wall MI 2002   a.) PCI was performed placing a RCA stent (unknown type)   History of kidney stones    HTN (hypertension)    Hyperlipidemia    Hypothyroidism    Long term current use of antithrombotics/antiplatelets    a.) DAPT therapy (ASA + clopidogrel )    OSA (obstructive sleep apnea)    a.) does not require nocturnal PAP therapy   PLMD (periodic limb movement disorder)    Postoperative atrial fibrillation ([name]) 01/03/2016   a.) following CABG procedure   Prostatitis    PVD (peripheral vascular disease) ([name])    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 b/) residual RIGHT sided weakness   T2DM (type 2 diabetes mellitus) ([name])    Tobacco abuse    Past Surgical History:  Procedure Laterality Date   CARDIAC CATHETERIZATION N/A 12/07/2015   Procedure: Left Heart Cath and Coronary Angiography;  Surgeon: Wolm JINNY Rhyme, MD;  Location: [facility];  Service: Cardiovascular;  Laterality: N/A;   CAROTID ANGIOGRAPHY N/A 06/05/2022   Procedure: CAROTID ANGIOGRAPHY;  Surgeon: Marea Selinda RAMAN, MD;  Location: [facility];  Service: Cardiovascular;  Laterality: N/A;   CAROTID PTA/STENT INTERVENTION Left    HX: 2 stents   CATARACT EXTRACTION W/PHACO Left 03/24/2021   Procedure: CATARACT EXTRACTION PHACO AND INTRAOCULAR LENS PLACEMENT (  IOC) LEFT DIABETIC 6.67 00:42.5;  Surgeon: Jaye Fallow, MD;  Location: [facility];  Service: Ophthalmology;  Laterality: Left;   COLONOSCOPY WITH PROPOFOL  N/A 02/18/2019   Procedure: COLONOSCOPY WITH PROPOFOL ;  Surgeon: Jinny Carmine, MD;  Location: [facility];  Service: Endoscopy;  Laterality: N/A;   COLONOSCOPY WITH PROPOFOL  N/A 12/22/2021   Procedure: COLONOSCOPY WITH PROPOFOL ;  Surgeon: Jinny Carmine, MD;  Location: [facility];  Service: Endoscopy;  Laterality: N/A;   CORONARY ANGIOPLASTY WITH STENT PLACEMENT Left 2002   CORONARY ARTERY BYPASS GRAFT N/A 01/03/2016   Procedure: 3v CORONARY ARTERY BYPASS GRAFT (LIMA-LAD, SVG-PDA, SVG-OM2); Location: [facility];  Surgeon: Debby Nova, MD   CYSTOSCOPY W/ URETERAL STENT REMOVAL N/A 10/04/2021   Procedure: CYSTOSCOPY WITH PROSTATE FOREIGN BODY REMOVAL;  Surgeon: Twylla Glendia BROCKS, MD;  Location: [facility];  Service: Urology;  Laterality: N/A;   CYSTOSCOPY WITH INSERTION OF UROLIFT N/A 04/29/2019   Procedure: CYSTOSCOPY WITH INSERTION OF UROLIFT;  Surgeon: Twylla Glendia BROCKS, MD;  Location: [facility];  Service: Urology;  Laterality: N/A;   ESOPHAGOGASTRODUODENOSCOPY (EGD) WITH PROPOFOL  N/A 09/11/2016   Procedure: ESOPHAGOGASTRODUODENOSCOPY (EGD) WITH PROPOFOL ;  Surgeon: Carmine Jinny, MD;  Location: [facility];  Service: Endoscopy;  Laterality: N/A;   ESOPHAGOGASTRODUODENOSCOPY (EGD) WITH PROPOFOL  N/A 10/17/2016   Procedure: ESOPHAGOGASTRODUODENOSCOPY (EGD) WITH PROPOFOL ;  Surgeon: Carmine Jinny, MD;  Location: [facility];  Service: Endoscopy;  Laterality: N/A;   ESOPHAGOGASTRODUODENOSCOPY (EGD) WITH PROPOFOL  N/A 11/13/2017   Procedure: ESOPHAGOGASTRODUODENOSCOPY (EGD) WITH PROPOFOL ;  Surgeon: Jinny Carmine, MD;  Location: [facility];  Service: Endoscopy;  Laterality: N/A;   pins R lower leg Left    rotator cuff replaced Right    SAVORY DILATION N/A 10/17/2016   Procedure: SAVORY DILATION;  Surgeon: Carmine Jinny, MD;  Location: [facility];  Service: Endoscopy;  Laterality: N/A;   TRANSURETHRAL INCISION OF PROSTATE N/A 10/04/2021   Procedure: TRANSURETHRAL INCISION OF THE PROSTATE (TUIP);  Surgeon: Twylla Glendia BROCKS, MD;  Location: [facility];  Service: Urology;  Laterality: N/A;   Patient Active Problem List   Diagnosis Date Noted   Acute cystitis without hematuria 05/15/2023   Pseudomonas infection 05/15/2023   Pseudomonas urinary tract infection 05/14/2023   Left testicular pain 05/13/2023   Generalized weakness 05/13/2023   Cellulitis of scrotum 05/13/2023   Cerebellar stroke, acute ([name]) 05/13/2023   Cellulitis 05/12/2023   Cellulitis of groin 05/12/2023   Carotid stenosis, symptomatic,  with infarction ([name]) 06/05/2022   History of colonic polyps    Anxiety state 11/17/2021   Cognitive deficit, post-stroke 11/17/2021   Ex-smoker 11/17/2021   Bradycardia 02/03/2021   Chronic systolic CHF (congestive heart failure), NYHA class 2 ([name]) 05/27/2020   Accelerated junctional rhythm 04/21/2019   Encounter for screening colonoscopy    Polyp of colon    CAD (coronary artery disease) 01/14/2019   Depression 01/14/2019   GERD (gastroesophageal reflux disease) 01/14/2019   Periodic limb movement disorder (PLMD) 01/14/2019   Sleep apnea 01/14/2019   Type 2 diabetes mellitus without complications ([name]) 01/14/2019   Stricture and stenosis of esophagus    Problems with swallowing and mastication    Food impaction of esophagus    H/O coronary artery bypass surgery 01/17/2016   Cerebrovascular accident (CVA) due to embolism ([name]) 01/03/2016   Hypothyroidism, unspecified 01/03/2016   HLD (hyperlipidemia) 01/03/2016   Stable angina ([name]) 11/29/2015   Benign essential hypertension 05/27/2015   Bilateral carotid artery stenosis 02/24/2015   Atherosclerotic peripheral vascular disease ([name]) 09/07/2014   Bladder calculus 08/12/2014  Primary osteoarthritis of left knee 03/10/2014   Elevated prostate specific antigen (PSA) 02/05/2013   Snoring 01/22/2013   Sleepiness 01/22/2013   Dysphagia 01/22/2013   Occlusion and stenosis of carotid artery without mention of cerebral infarction 01/22/2013   Cerebral thrombosis with cerebral infarction ([name]) 01/22/2013   Benign prostatic hyperplasia with incomplete bladder emptying 08/26/2012   Chronic prostatitis 08/26/2012   Family history of malignant neoplasm of prostate 08/26/2012   Incomplete emptying of bladder 08/26/2012   Dysphagia 05/14/2012   Global aphasia 05/14/2012   Stroke, acute, thrombotic ([name]) 05/12/2012   Symptomatic carotid artery stenosis 05/12/2012   HTN (hypertension) 05/12/2012   COPD (chronic obstructive pulmonary  disease) ([name]) 05/12/2012   Acute respiratory failure ([name]) 05/12/2012    FROM INITIAL EVALUATION PCP: Don Lauraine Collar, NP  REFERRING PROVIDER: Don Lauraine Collar, *   REFERRING DIAG: I63.10 (ICD-10-CM) - Cerebral infarction due to embolism of unspecified precerebral artery S06.9XAA (ICD-10-CM) - Unspecified intracranial injury with loss of consciousness status unknown, initial encounter R47.01 (ICD-10-CM) - Aphasia S01.90XA (ICD-10-CM) - Unspecified open wound of unspecified part of head, initial encounter  RATIONALE FOR EVALUATION AND TREATMENT: Rehabilitation  THERAPY DIAG: Unsteadiness on feet  Muscle weakness (generalized)  ONSET DATE: 05/12/23  FOLLOW-UP APPT SCHEDULED WITH REFERRING PROVIDER: Yes    SUBJECTIVE:                                                                                                                                                                                         SUBJECTIVE STATEMENT:   Severe imbalance with R sided weakness;   PERTINENT HISTORY:  Pt referred to PT for unsteadiness s/p CVA. He was hospitalized at [facility] on 05/12/23-05/15/23 for chills, weakness, and left-sided testicular pain for several days. Noted baseline history of CVA with right-sided deficits. This was acutely worsened w/ his other sx. Urinalysis mildly indicative of infection. Troponin 19-28. COVID flu and RSV negative. Lactate within normal limits. CT head- negative acute intracranial abnormality. Old left MCA infarct with left frontal encephalomalacia (R sided weakness). Atrophy, chronic microvascular disease. Brain MRI read as punctate acute or subacute infarct in the right superior cerebellum. No associated hemorrhage or mass effect. Scrotal ultrasound showed bilateral hydroceles, simple on the right and complex (multiseptated) left, bilateral varicoceles, epididymides are enlarged and heterogeneous in appearance bilaterally, with multiple epididymal cysts and/or  spermatoceles, and multiple calcifications. No evidence of testicular torsion, or findings suggestive of epididymo-orchitis.Initially treated with IV rocephin , flagyl , vancomycin  as well as topical miconazole  cream. Neurology and urology were consulted and was admitted for further wrkp/management. Neurology did not recommend any further neurological workup and he was asymptomatic. Additionally,  patient was found to have significantly elevated blood pressures throughout his stay and was not on home therapy. He was started on entresto  given his CAD and HFpEF following permissive hypertensive period however he was taken off after his recent cardiology visit. Pt has had 3 falls since returning home from the hospital. He had a notable worsening of his balance with recent CVA. Recently finished speech therapy.   Pain: No Numbness/Tingling: No Focal Weakness: Yes, RUE/RLE weakness, mild R facial weakness Recent changes in overall health/medication: Yes Prior history of physical therapy for balance:  Yes, 2013 and L MCA infarct Dominant hand: right Imaging: Yes  Red flags: Negative for bowel/bladder changes, personal history of cancer, abdominal pain, chills/fever, night sweats, nausea, vomiting, unrelenting pain,  PRECAUTIONS: None  WEIGHT BEARING RESTRICTIONS: No  FALLS: Has patient fallen in last 6 months? Yes. 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   Patient Goals: Pt wants to be able to work in the yard, strengthen his R side, and improve his balance.     OBJECTIVE:   Patient Surveys  FOTO: 50, predicted improvement to 24   Cognition Patient is oriented to person, place, and time.  Recent memory is intact.  Remote memory is intact.  Attention span and concentration are intact.  Expressive aphasia noted; Patient's fund of knowledge is within normal limits for educational level.    Gross Musculoskeletal Assessment Tremor: None Bulk: Normal Tone: Normal  Posture: No gross abnormalities noted in standing or seated posture  AROM No gross deficits noted with functional activities  LE MMT: MMT (out of 5) Right  Left   Hip flexion 4+ 4+  Hip extension    Hip abduction (seated) 5 5  Hip adduction (seated) 5 5  Hip internal rotation 5 5  Hip external rotation 5 5  Knee flexion 5 5  Knee extension 5 5  Ankle dorsiflexion 5 5  Ankle plantarflexion Active Active  Ankle inversion    Ankle eversion    (* = pain; Blank rows = not tested)  Slight decrease in R shoulder flexion and R grip strength noted compared to left side  Sensation Grossly intact to light touch throughout bilateral LEs as determined by testing dermatomes L2-S2. Proprioception, stereognosis, and hot/cold testing deferred on this date.  Reflexes Deferred  Cranial Nerves Visual acuity and visual fields are intact  Extraocular muscles are intact  Facial sensation is intact bilaterally  Facial strength demonstrates slight droop in R corner of mouth  Hearing is normal as tested by gross conversation Palate elevates midline, normal phonation  Shoulder shrug strength is intact  Tongue protrudes midline  Coordination/Cerebellar Finger to Nose: Mildly dysmetric RUE Heel to Shin: Mildly dysmetric RLE Rapid alternating movements: WNL Finger Opposition: More difficult R hand Pronator Drift: Negative  Bed mobility: Deferred  Transfers: Assistive device utilized: None  Sit to stand: Complete Independence Stand to sit: Complete Independence Chair to  chair: Complete Independence Floor:  Not tested  Curb:  Not tested  Stairs: Level of Assistance: Modified independence Stair Negotiation Technique: Step to Pattern with Bilateral  Rails Number of Stairs: 4  Height of Stairs: 6  Comments: Utilized step-to pattern during descent only;  Gait: Gait pattern: decreased step length- Left and decreased stance time- Right Distance walked: 100 Assistive device utilized: None Level of assistance: Complete Independence Comments: Slight decrease stance time RLE, decreased self-selected speed  Functional Outcome Measures  Results Comments  BERG 49/56   DGI    FGA    TUG 9.5 seconds   5TSTS 11.8 seconds   6 Minute Walk Test    10 Meter Gait Speed    (Blank rows = not tested)   TODAY'S TREATMENT (08/03/23):      SUBJECTIVE: Pt reports that he is doing well today. No changes since the last therapy session. No pain reported. No specific questions or concerns.    PAIN: None reported    Neuromuscular Re-education  Standing semi tandem with one foot on Airex and other on 6 step x 60 seconds leading with each LE  Airex alternating 12 step taps without UE support x 10 BLE; Feet together (FT) eyes open/closed x 30s each; FT heel/toe raises x 10 each direction; Heel/toe raises with 3s hold x 10 each direction;   Ther-ex  NuStep L1-4 x 9 minutes for warm-up and BLE strengthening during interval history, therapist adjusting resistance and monitoring fatigue to ensure proper difficulty (5 minutes unbilled); TRX squats 2 x 15; Forward BOSU lunges x 10 with each foot forward; Nautilus resisted gait 60# forward, backward, R lateral, and L lateral x 3 each direction;   Not performed: Tandem balance alternating forward LE horizontal and vertical head turns x 30s each Blaze pods with random setting, 5 pods, 2 cycles, 60s/cycle. Star toe taps from 9 to 3 o'clock; Blaze pods with random setting, 5 pods with 4 distracting colors, 2 cycles,  60s/cycle. Star toe taps from 9 to 3 o'clock, significantly more difficult for patient due to the cognitive challenge; 6 forward step-ups without UE support x 10 BLE; Total Gym (TG) Level 22 (L22) double leg squats x 15; TG L22 single leg squats 2 x 10 BLE;  Seated LAQ with 5# ankle weights x 30s BLE; Forward high knee marching in // bars with contralateral hand to knee 2 x 2 lengths (motor planning very difficult for patient); Cross-over stepping in // bars with faded UE support x 4 lengths; Airex static balance eyes open/closed x 30s each; Airex feet apart ball passes around body to therapist with return pass on opposite side x multiple bouts on each side; Sit to stand with overhead 8# med ball press 2 x 10; Obstacle course with 6 hurdles and Airex pad step up/down in // bars without UE support x multiple lengths; Forward gait in // bars without UE support with 6 lateral cone taps; Tandem balance alternating forward LE x 30s each; Tandem gait in // bars without UE support x multiple lengths;     PATIENT EDUCATION:  Education details: Outcome measures. Pt educated throughout session about proper posture and technique with exercises. Improved exercise technique, movement at target joints, use of target muscles after min to mod verbal, visual, tactile cues.  Person educated: Patient Education method: Explanation Education comprehension: verbalized understanding   HOME EXERCISE PROGRAM:  Access Code: [email or web address] URL: [email or web address] Date: 07/03/2023 Prepared by: Selinda Eck  Exercises - Seated March with Resistance  - 1 x daily - 7 x weekly - 3 sets - 30s hold - Seated Hip Adduction Isometrics with Ball  - 1 x daily - 7  x weekly - 3 sets - 30s hold - Seated Hip Abduction with Resistance  - 1 x daily - 7 x weekly - 3 sets - 30s hold - Seated Long Arc Quad  - 1 x daily - 7 x weekly - 3 sets - 30s hold - Mini Squat with Counter Support  - 1 x daily - 7 x weekly -  3 sets - 30s hold - Heel Raises with Counter Support  - 1 x daily - 7 x weekly - 3 sets - 30s hold   ASSESSMENT:  CLINICAL IMPRESSION:   Patient arrives to treatment session motivated to participate. Session focused on BLE strengthening and balance. Introduced TRX squats which are fatiguing for patient. Gait belt donned throughout session. He continues to struggle with motor planning as well as aphasia making feedback difficult. No HEP updates today. Patient will continue to benefit from skilled therapy to address remaining deficits in order to improve quality of life and return to prior level of function.  OBJECTIVE IMPAIRMENTS: Abnormal gait, decreased balance, decreased coordination, difficulty walking, and decreased strength.   ACTIVITY LIMITATIONS: carrying, lifting, bending, squatting, stairs, and transfers  PARTICIPATION LIMITATIONS: meal prep, cleaning, shopping, community activity, and yard work  PERSONAL FACTORS: Age and 3+ comorbidities: aphasia, CAD, carotid stenosis, prior CVA, CHF, and COPD  are also affecting patient's functional outcome.   REHAB POTENTIAL: Good  CLINICAL DECISION MAKING: Unstable/unpredictable  EVALUATION COMPLEXITY: High   GOALS: Goals reviewed with patient? No  SHORT TERM GOALS: Target date: 08/10/2023  Pt will be independent with HEP in order to improve strength and balance in order to decrease fall risk and improve function at home. Baseline:  Goal status: INITIAL   LONG TERM GOALS: Target date: 08/16/2023  Pt will increase FOTO to at least 62 to demonstrate significant improvement in function at home related to balance  Baseline: 50; 07/31/23: 46 Goal status: ONGOING  2.  Pt will improve BERG by at least 3 points in order to demonstrate clinically significant improvement in balance.   Baseline: 06/21/23: 49/56, 07/31/23: 51/56;  Goal status: PARTIALLY MET  3. Pt will improve DGI by at least 3 points in order to demonstrate clinically  significant improvement in balance and decreased risk for falls.     Baseline: 14/24, 07/31/23: 17/24; Goal status: ACHIEVED  4. Pt will increase by at least 5m (126ft) in order to demonstrate clinically significant improvement in cardiopulmonary endurance and community ambulation   Baseline: 5' without assistive device. Occasional R foot shuffling, 07/31/23: 940' without assistive device, occasional R foot shuffling; Goal status: PARTIALLY MET   PLAN: PT FREQUENCY: 2x/week  PT DURATION: 8 weeks  PLANNED INTERVENTIONS: Therapeutic exercises, Therapeutic activity, Neuromuscular re-education, Balance training, Gait training, Patient/Family education, Self Care, Joint mobilization, Joint manipulation, Vestibular training, Canalith repositioning, Orthotic/Fit training, DME instructions, Dry Needling, Electrical stimulation, Spinal manipulation, Spinal mobilization, Cryotherapy, Moist heat, Taping, Traction, Ultrasound, Ionotophoresis 4mg /ml Dexamethasone , Manual therapy, and Re-evaluation.  PLAN FOR NEXT SESSION: Progress balance and strengthening exercises, modify/review HEP (add balance exercises) as necessary;  Selinda BIRCH Mccrae Speciale PT, DPT, GCS  Kateline Kinkade, PT, DPT  08/03/2023, 8:30 AM

## 2023-08-04 DIAGNOSIS — I878 Other specified disorders of veins: Secondary | ICD-10-CM | POA: Diagnosis not present

## 2023-08-04 DIAGNOSIS — R948 Abnormal results of function studies of other organs and systems: Secondary | ICD-10-CM | POA: Diagnosis not present

## 2023-08-04 DIAGNOSIS — N210 Calculus in bladder: Secondary | ICD-10-CM | POA: Diagnosis not present

## 2023-08-07 ENCOUNTER — Ambulatory Visit: Payer: Medicare HMO

## 2023-08-07 DIAGNOSIS — R2681 Unsteadiness on feet: Secondary | ICD-10-CM

## 2023-08-07 DIAGNOSIS — M6281 Muscle weakness (generalized): Secondary | ICD-10-CM | POA: Diagnosis not present

## 2023-08-07 NOTE — Therapy (Signed)
 OUTPATIENT PHYSICAL THERAPY BALANCE TREATMENT   Patient Name: Paul Bass MRN: [id number] DOB:1945/12/26, 78 y.o., male Today's Date: 08/08/2023  END OF SESSION:  PT End of Session - 08/07/23 1410     Visit Number 12    Number of Visits 17    Date for PT Re-Evaluation 08/16/23    Authorization Type eval: 06/21/23    PT Start Time 1405    PT Stop Time 1445    PT Time Calculation (min) 40 min    Equipment Utilized During Treatment Gait belt    Activity Tolerance Patient tolerated treatment well            Past Medical History:  Diagnosis Date   Accelerated junctional rhythm    Anemia    Anxiety    a.) Tx'd with BZO PRN   Arthritis    Bilateral carotid artery disease ([name])    BPH (benign prostatic hypertrophy)    CAD S/P percutaneous coronary angioplasty    a.) PCI in 2002 placing a RCA stent (unknown type). b.) LHC 12/07/2015: 5% ISR m-dRCA, 90% dRCA, 25% pRCA, 25% p-mLCx, 100% OM3, 95% oOM2-OM2, 75% m-dLAD; refer to CVTS. c.) 3v CABG 01/03/2016   CHF (congestive heart failure) ([name])    a.) TTE 05/12/2012: EF 50-55%; mild LA enlargement; G1DD. b.)  TTE 11/26/2015: EF 45%; mild BAE; triv PR, mild MR/TR; inferolateral HK; G1DD. c.)  TTE 11/07/2017: EF 35%; RV enlargement; BAE; inferior and inferolateral HK; triv PR; mild MR/TR. d.)  TTE 09/17/2019: EF 35%; mild LVH; triv MR/TR. e.)  TTE 08/11/2021: EF 50%; triv MR/TR; G1DD.   Cognitive deficit following cerebrovascular accident (CVA)    COPD (chronic obstructive pulmonary disease) ([name])    Cortical cataract    Depression    Dyspnea    GERD (gastroesophageal reflux disease)    History of 2019 novel coronavirus disease (COVID-19) 01/19/2021   History of acute inferior wall MI 2002   a.) PCI was performed placing a RCA stent (unknown type)   History of kidney stones    HTN (hypertension)    Hyperlipidemia    Hypothyroidism    Long term current use of antithrombotics/antiplatelets    a.) DAPT therapy (ASA + clopidogrel )    OSA (obstructive sleep apnea)    a.) does not require nocturnal PAP therapy   PLMD (periodic limb movement disorder)    Postoperative atrial fibrillation ([name]) 01/03/2016   a.) following CABG procedure   Prostatitis    PVD (peripheral vascular disease) ([name])    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 b/) residual RIGHT sided weakness   T2DM (type 2 diabetes mellitus) ([name])    Tobacco abuse    Past Surgical History:  Procedure Laterality Date   CARDIAC CATHETERIZATION N/A 12/07/2015   Procedure: Left Heart Cath and Coronary Angiography;  Surgeon: Wolm JINNY Rhyme, MD;  Location: [facility];  Service: Cardiovascular;  Laterality: N/A;   CAROTID ANGIOGRAPHY N/A 06/05/2022   Procedure: CAROTID ANGIOGRAPHY;  Surgeon: Marea Selinda RAMAN, MD;  Location: [facility];  Service: Cardiovascular;  Laterality: N/A;   CAROTID PTA/STENT INTERVENTION Left    HX: 2 stents   CATARACT EXTRACTION W/PHACO Left 03/24/2021   Procedure: CATARACT EXTRACTION PHACO AND INTRAOCULAR LENS PLACEMENT (  IOC) LEFT DIABETIC 6.67 00:42.5;  Surgeon: Jaye Fallow, MD;  Location: [facility];  Service: Ophthalmology;  Laterality: Left;   COLONOSCOPY WITH PROPOFOL  N/A 02/18/2019   Procedure: COLONOSCOPY WITH PROPOFOL ;  Surgeon: Jinny Carmine, MD;  Location: [facility];  Service: Endoscopy;  Laterality: N/A;   COLONOSCOPY WITH PROPOFOL  N/A 12/22/2021   Procedure: COLONOSCOPY WITH PROPOFOL ;  Surgeon: Jinny Carmine, MD;  Location: [facility];  Service: Endoscopy;  Laterality: N/A;   CORONARY ANGIOPLASTY WITH STENT PLACEMENT Left 2002   CORONARY ARTERY BYPASS GRAFT N/A 01/03/2016   Procedure: 3v CORONARY ARTERY BYPASS GRAFT (LIMA-LAD, SVG-PDA, SVG-OM2); Location: [facility];  Surgeon: Debby Nova, MD   CYSTOSCOPY W/ URETERAL STENT REMOVAL N/A 10/04/2021   Procedure: CYSTOSCOPY WITH PROSTATE FOREIGN BODY REMOVAL;  Surgeon: Twylla Glendia BROCKS, MD;  Location: [facility];  Service: Urology;  Laterality: N/A;   CYSTOSCOPY WITH INSERTION OF UROLIFT N/A 04/29/2019   Procedure: CYSTOSCOPY WITH INSERTION OF UROLIFT;  Surgeon: Twylla Glendia BROCKS, MD;  Location: [facility];  Service: Urology;  Laterality: N/A;   ESOPHAGOGASTRODUODENOSCOPY (EGD) WITH PROPOFOL  N/A 09/11/2016   Procedure: ESOPHAGOGASTRODUODENOSCOPY (EGD) WITH PROPOFOL ;  Surgeon: Carmine Jinny, MD;  Location: [facility];  Service: Endoscopy;  Laterality: N/A;   ESOPHAGOGASTRODUODENOSCOPY (EGD) WITH PROPOFOL  N/A 10/17/2016   Procedure: ESOPHAGOGASTRODUODENOSCOPY (EGD) WITH PROPOFOL ;  Surgeon: Carmine Jinny, MD;  Location: [facility];  Service: Endoscopy;  Laterality: N/A;   ESOPHAGOGASTRODUODENOSCOPY (EGD) WITH PROPOFOL  N/A 11/13/2017   Procedure: ESOPHAGOGASTRODUODENOSCOPY (EGD) WITH PROPOFOL ;  Surgeon: Jinny Carmine, MD;  Location: [facility];  Service: Endoscopy;  Laterality: N/A;   pins R lower leg Left    rotator cuff replaced Right    SAVORY DILATION N/A 10/17/2016   Procedure: SAVORY DILATION;  Surgeon: Carmine Jinny, MD;  Location: [facility];  Service: Endoscopy;  Laterality: N/A;   TRANSURETHRAL INCISION OF PROSTATE N/A 10/04/2021   Procedure: TRANSURETHRAL INCISION OF THE PROSTATE (TUIP);  Surgeon: Twylla Glendia BROCKS, MD;  Location: [facility];  Service: Urology;  Laterality: N/A;   Patient Active Problem List   Diagnosis Date Noted   Acute cystitis without hematuria 05/15/2023   Pseudomonas infection 05/15/2023   Pseudomonas urinary tract infection 05/14/2023   Left testicular pain 05/13/2023   Generalized weakness 05/13/2023   Cellulitis of scrotum 05/13/2023   Cerebellar stroke, acute ([name]) 05/13/2023   Cellulitis 05/12/2023   Cellulitis of groin 05/12/2023   Carotid stenosis, symptomatic,  with infarction ([name]) 06/05/2022   History of colonic polyps    Anxiety state 11/17/2021   Cognitive deficit, post-stroke 11/17/2021   Ex-smoker 11/17/2021   Bradycardia 02/03/2021   Chronic systolic CHF (congestive heart failure), NYHA class 2 ([name]) 05/27/2020   Accelerated junctional rhythm 04/21/2019   Encounter for screening colonoscopy    Polyp of colon    CAD (coronary artery disease) 01/14/2019   Depression 01/14/2019   GERD (gastroesophageal reflux disease) 01/14/2019   Periodic limb movement disorder (PLMD) 01/14/2019   Sleep apnea 01/14/2019   Type 2 diabetes mellitus without complications ([name]) 01/14/2019   Stricture and stenosis of esophagus    Problems with swallowing and mastication    Food impaction of esophagus    H/O coronary artery bypass surgery 01/17/2016   Cerebrovascular accident (CVA) due to embolism ([name]) 01/03/2016   Hypothyroidism, unspecified 01/03/2016   HLD (hyperlipidemia) 01/03/2016   Stable angina ([name]) 11/29/2015   Benign essential hypertension 05/27/2015   Bilateral carotid artery stenosis 02/24/2015   Atherosclerotic peripheral vascular disease ([name]) 09/07/2014   Bladder calculus 08/12/2014  Primary osteoarthritis of left knee 03/10/2014   Elevated prostate specific antigen (PSA) 02/05/2013   Snoring 01/22/2013   Sleepiness 01/22/2013   Dysphagia 01/22/2013   Occlusion and stenosis of carotid artery without mention of cerebral infarction 01/22/2013   Cerebral thrombosis with cerebral infarction ([name]) 01/22/2013   Benign prostatic hyperplasia with incomplete bladder emptying 08/26/2012   Chronic prostatitis 08/26/2012   Family history of malignant neoplasm of prostate 08/26/2012   Incomplete emptying of bladder 08/26/2012   Dysphagia 05/14/2012   Global aphasia 05/14/2012   Stroke, acute, thrombotic ([name]) 05/12/2012   Symptomatic carotid artery stenosis 05/12/2012   HTN (hypertension) 05/12/2012   COPD (chronic obstructive pulmonary  disease) ([name]) 05/12/2012   Acute respiratory failure ([name]) 05/12/2012    FROM INITIAL EVALUATION PCP: Don Lauraine Collar, NP  REFERRING PROVIDER: Don Lauraine Collar, *   REFERRING DIAG: I63.10 (ICD-10-CM) - Cerebral infarction due to embolism of unspecified precerebral artery S06.9XAA (ICD-10-CM) - Unspecified intracranial injury with loss of consciousness status unknown, initial encounter R47.01 (ICD-10-CM) - Aphasia S01.90XA (ICD-10-CM) - Unspecified open wound of unspecified part of head, initial encounter  RATIONALE FOR EVALUATION AND TREATMENT: Rehabilitation  THERAPY DIAG: Unsteadiness on feet  Muscle weakness (generalized)  ONSET DATE: 05/12/23  FOLLOW-UP APPT SCHEDULED WITH REFERRING PROVIDER: Yes    SUBJECTIVE:                                                                                                                                                                                         SUBJECTIVE STATEMENT:   Severe imbalance with R sided weakness;   PERTINENT HISTORY:  Pt referred to PT for unsteadiness s/p CVA. He was hospitalized at [facility] on 05/12/23-05/15/23 for chills, weakness, and left-sided testicular pain for several days. Noted baseline history of CVA with right-sided deficits. This was acutely worsened w/ his other sx. Urinalysis mildly indicative of infection. Troponin 19-28. COVID flu and RSV negative. Lactate within normal limits. CT head- negative acute intracranial abnormality. Old left MCA infarct with left frontal encephalomalacia (R sided weakness). Atrophy, chronic microvascular disease. Brain MRI read as punctate acute or subacute infarct in the right superior cerebellum. No associated hemorrhage or mass effect. Scrotal ultrasound showed bilateral hydroceles, simple on the right and complex (multiseptated) left, bilateral varicoceles, epididymides are enlarged and heterogeneous in appearance bilaterally, with multiple epididymal cysts and/or  spermatoceles, and multiple calcifications. No evidence of testicular torsion, or findings suggestive of epididymo-orchitis.Initially treated with IV rocephin , flagyl , vancomycin  as well as topical miconazole  cream. Neurology and urology were consulted and was admitted for further wrkp/management. Neurology did not recommend any further neurological workup and he was asymptomatic. Additionally,  patient was found to have significantly elevated blood pressures throughout his stay and was not on home therapy. He was started on entresto  given his CAD and HFpEF following permissive hypertensive period however he was taken off after his recent cardiology visit. Pt has had 3 falls since returning home from the hospital. He had a notable worsening of his balance with recent CVA. Recently finished speech therapy.   Pain: No Numbness/Tingling: No Focal Weakness: Yes, RUE/RLE weakness, mild R facial weakness Recent changes in overall health/medication: Yes Prior history of physical therapy for balance:  Yes, 2013 and L MCA infarct Dominant hand: right Imaging: Yes  Red flags: Negative for bowel/bladder changes, personal history of cancer, abdominal pain, chills/fever, night sweats, nausea, vomiting, unrelenting pain,  PRECAUTIONS: None  WEIGHT BEARING RESTRICTIONS: No  FALLS: Has patient fallen in last 6 months? Yes. 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   Patient Goals: Pt wants to be able to work in the yard, strengthen his R side, and improve his balance.     OBJECTIVE:   Patient Surveys  FOTO: 50, predicted improvement to 58   Cognition Patient is oriented to person, place, and time.  Recent memory is intact.  Remote memory is intact.  Attention span and concentration are intact.  Expressive aphasia noted; Patient's fund of knowledge is within normal limits for educational level.    Gross Musculoskeletal Assessment Tremor: None Bulk: Normal Tone: Normal  Posture: No gross abnormalities noted in standing or seated posture  AROM No gross deficits noted with functional activities  LE MMT: MMT (out of 5) Right  Left   Hip flexion 4+ 4+  Hip extension    Hip abduction (seated) 5 5  Hip adduction (seated) 5 5  Hip internal rotation 5 5  Hip external rotation 5 5  Knee flexion 5 5  Knee extension 5 5  Ankle dorsiflexion 5 5  Ankle plantarflexion Active Active  Ankle inversion    Ankle eversion    (* = pain; Blank rows = not tested)  Slight decrease in R shoulder flexion and R grip strength noted compared to left side  Sensation Grossly intact to light touch throughout bilateral LEs as determined by testing dermatomes L2-S2. Proprioception, stereognosis, and hot/cold testing deferred on this date.  Reflexes Deferred  Cranial Nerves Visual acuity and visual fields are intact  Extraocular muscles are intact  Facial sensation is intact bilaterally  Facial strength demonstrates slight droop in R corner of mouth  Hearing is normal as tested by gross conversation Palate elevates midline, normal phonation  Shoulder shrug strength is intact  Tongue protrudes midline  Coordination/Cerebellar Finger to Nose: Mildly dysmetric RUE Heel to Shin: Mildly dysmetric RLE Rapid alternating movements: WNL Finger Opposition: More difficult R hand Pronator Drift: Negative  Bed mobility: Deferred  Transfers: Assistive device utilized: None  Sit to stand: Complete Independence Stand to sit: Complete Independence Chair to  chair: Complete Independence Floor:  Not tested  Curb:  Not tested  Stairs: Level of Assistance: Modified independence Stair Negotiation Technique: Step to Pattern with Bilateral  Rails Number of Stairs: 4  Height of Stairs: 6  Comments: Utilized step-to pattern during descent only;  Gait: Gait pattern: decreased step length- Left and decreased stance time- Right Distance walked: 100 Assistive device utilized: None Level of assistance: Complete Independence Comments: Slight decrease stance time RLE, decreased self-selected speed  Functional Outcome Measures  Results Comments  BERG 49/56   DGI    FGA    TUG 9.5 seconds   5TSTS 11.8 seconds   6 Minute Walk Test    10 Meter Gait Speed    (Blank rows = not tested)   TODAY'S TREATMENT (08/08/23):      SUBJECTIVE: Pt reports that he is doing well today. No changes since the last therapy session. No pain reported. No specific questions or concerns.    PAIN: None reported   Neuromuscular Re-education  Standing semi tandem with one foot on Airex and other on 6 step x 60 seconds leading with each LE  Airex alternating 12 step taps without UE support x 10 BLE; Heel/toe raises with 3s hold x 12 each direction; Airex feet apart ball passes around body to therapist with return pass on opposite side x multiple bouts on each side;   Ther-ex  NuStep L1-4 x 9 minutes for warm-up and BLE strengthening during interval history, therapist adjusting resistance and monitoring fatigue to ensure proper difficulty (5 minutes unbilled); Nautilus resisted gait 50# forward, backward, R lateral, and L lateral x 2 each direction; Sit to stand with overhead 8# med ball press 2 x 10;   Not performed: Tandem balance alternating forward LE horizontal and vertical head turns x 30s each Blaze pods with random setting, 5 pods, 2 cycles, 60s/cycle. Star toe taps from 9 to 3 o'clock; Blaze pods with random setting, 5 pods with 4 distracting colors,  2 cycles, 60s/cycle. Star toe taps from 9 to 3 o'clock, significantly more difficult for patient due to the cognitive challenge; 6 forward step-ups without UE support x 10 BLE; Total Gym (TG) Level 22 (L22) double leg squats x 15; TG L22 single leg squats 2 x 10 BLE;  Seated LAQ with 5# ankle weights x 30s BLE; Forward high knee marching in // bars with contralateral hand to knee 2 x 2 lengths (motor planning very difficult for patient); Cross-over stepping in // bars with faded UE support x 4 lengths; Airex static balance eyes open/closed x 30s each; Obstacle course with 6 hurdles and Airex pad step up/down in // bars without UE support x multiple lengths; Forward gait in // bars without UE support with 6 lateral cone taps; Tandem balance alternating forward LE x 30s each; Tandem gait in // bars without UE support x multiple lengths; Feet together (FT) eyes open/closed x 30s each; FT heel/toe raises x 10 each direction; TRX squats 2 x 15; Forward BOSU lunges x 10 with each foot forward;   PATIENT EDUCATION:  Education details: Outcome measures. Pt educated throughout session about proper posture and technique with exercises. Improved exercise technique, movement at target joints, use of target muscles after min to mod verbal, visual, tactile cues.  Person educated: Patient Education method: Explanation Education comprehension: verbalized understanding   HOME EXERCISE PROGRAM:  Access Code: [email or web address] URL: [email or web address] Date: 07/03/2023 Prepared by: Selinda Eck  Exercises - Seated March with Resistance  - 1 x daily - 7 x weekly - 3 sets - 30s hold - Seated Hip Adduction Isometrics with Ball  - 1 x daily - 7 x weekly -  3 sets - 30s hold - Seated Hip Abduction with Resistance  - 1 x daily - 7 x weekly - 3 sets - 30s hold - Seated Long Arc Quad  - 1 x daily - 7 x weekly - 3 sets - 30s hold - Mini Squat with Counter Support  - 1 x daily - 7 x weekly - 3 sets -  30s hold - Heel Raises with Counter Support  - 1 x daily - 7 x weekly - 3 sets - 30s hold   ASSESSMENT:  CLINICAL IMPRESSION:   Patient arrives to treatment session motivated to participate. Session focused on BLE strengthening and balance. Decreased Nautilus resisted gait to 50#; patient reported feeling fatigued. Gait belt donned throughout session. He continues to struggle with motor planning as well as aphasia making feedback difficult. No HEP updates today. Patient will continue to benefit from skilled therapy to address remaining deficits in order to improve quality of life and return to prior level of function.  OBJECTIVE IMPAIRMENTS: Abnormal gait, decreased balance, decreased coordination, difficulty walking, and decreased strength.   ACTIVITY LIMITATIONS: carrying, lifting, bending, squatting, stairs, and transfers  PARTICIPATION LIMITATIONS: meal prep, cleaning, shopping, community activity, and yard work  PERSONAL FACTORS: Age and 3+ comorbidities: aphasia, CAD, carotid stenosis, prior CVA, CHF, and COPD  are also affecting patient's functional outcome.   REHAB POTENTIAL: Good  CLINICAL DECISION MAKING: Unstable/unpredictable  EVALUATION COMPLEXITY: High   GOALS: Goals reviewed with patient? No  SHORT TERM GOALS: Target date: 08/10/2023  Pt will be independent with HEP in order to improve strength and balance in order to decrease fall risk and improve function at home. Baseline:  Goal status: INITIAL   LONG TERM GOALS: Target date: 08/16/2023  Pt will increase FOTO to at least 62 to demonstrate significant improvement in function at home related to balance  Baseline: 50; 07/31/23: 46 Goal status: ONGOING  2.  Pt will improve BERG by at least 3 points in order to demonstrate clinically significant improvement in balance.   Baseline: 06/21/23: 49/56, 07/31/23: 51/56;  Goal status: PARTIALLY MET  3. Pt will improve DGI by at least 3 points in order to demonstrate  clinically significant improvement in balance and decreased risk for falls.     Baseline: 14/24, 07/31/23: 17/24; Goal status: ACHIEVED  4. Pt will increase by at least 54m (194ft) in order to demonstrate clinically significant improvement in cardiopulmonary endurance and community ambulation   Baseline: 74' without assistive device. Occasional R foot shuffling, 07/31/23: 940' without assistive device, occasional R foot shuffling; Goal status: PARTIALLY MET   PLAN: PT FREQUENCY: 2x/week  PT DURATION: 8 weeks  PLANNED INTERVENTIONS: Therapeutic exercises, Therapeutic activity, Neuromuscular re-education, Balance training, Gait training, Patient/Family education, Self Care, Joint mobilization, Joint manipulation, Vestibular training, Canalith repositioning, Orthotic/Fit training, DME instructions, Dry Needling, Electrical stimulation, Spinal manipulation, Spinal mobilization, Cryotherapy, Moist heat, Taping, Traction, Ultrasound, Ionotophoresis 4mg /ml Dexamethasone , Manual therapy, and Re-evaluation.  PLAN FOR NEXT SESSION: Progress balance and strengthening exercises, modify/review HEP (add balance exercises) as necessary;  Selinda JONETTA Eck PT, DPT, GCS  Eric Morganti, PT, DPT  08/08/2023, 10:17 AM

## 2023-08-09 ENCOUNTER — Ambulatory Visit: Payer: Medicare HMO

## 2023-08-09 DIAGNOSIS — R2681 Unsteadiness on feet: Secondary | ICD-10-CM

## 2023-08-09 DIAGNOSIS — M6281 Muscle weakness (generalized): Secondary | ICD-10-CM | POA: Diagnosis not present

## 2023-08-09 NOTE — Therapy (Addendum)
 OUTPATIENT PHYSICAL THERAPY BALANCE TREATMENT   Patient Name: Paul Bass MRN: [id number] DOB:1946/01/24, 78 y.o., male Today's Date: 08/09/2023  END OF SESSION:  PT End of Session - 08/09/23 1352     Visit Number 13    Number of Visits 17    Date for PT Re-Evaluation 08/16/23    Authorization Type eval: 06/21/23    PT Start Time 1400    PT Stop Time 1445    PT Time Calculation (min) 45 min    Equipment Utilized During Treatment Gait belt    Activity Tolerance Patient tolerated treatment well            Past Medical History:  Diagnosis Date   Accelerated junctional rhythm    Anemia    Anxiety    a.) Tx'd with BZO PRN   Arthritis    Bilateral carotid artery disease ([name])    BPH (benign prostatic hypertrophy)    CAD S/P percutaneous coronary angioplasty    a.) PCI in 2002 placing a RCA stent (unknown type). b.) LHC 12/07/2015: 5% ISR m-dRCA, 90% dRCA, 25% pRCA, 25% p-mLCx, 100% OM3, 95% oOM2-OM2, 75% m-dLAD; refer to CVTS. c.) 3v CABG 01/03/2016   CHF (congestive heart failure) ([name])    a.) TTE 05/12/2012: EF 50-55%; mild LA enlargement; G1DD. b.)  TTE 11/26/2015: EF 45%; mild BAE; triv PR, mild MR/TR; inferolateral HK; G1DD. c.)  TTE 11/07/2017: EF 35%; RV enlargement; BAE; inferior and inferolateral HK; triv PR; mild MR/TR. d.)  TTE 09/17/2019: EF 35%; mild LVH; triv MR/TR. e.)  TTE 08/11/2021: EF 50%; triv MR/TR; G1DD.   Cognitive deficit following cerebrovascular accident (CVA)    COPD (chronic obstructive pulmonary disease) ([name])    Cortical cataract    Depression    Dyspnea    GERD (gastroesophageal reflux disease)    History of 2019 novel coronavirus disease (COVID-19) 01/19/2021   History of acute inferior wall MI 2002   a.) PCI was performed placing a RCA stent (unknown type)   History of kidney stones    HTN (hypertension)    Hyperlipidemia    Hypothyroidism    Long term current use of antithrombotics/antiplatelets    a.) DAPT therapy (ASA + clopidogrel )    OSA (obstructive sleep apnea)    a.) does not require nocturnal PAP therapy   PLMD (periodic limb movement disorder)    Postoperative atrial fibrillation ([name]) 01/03/2016   a.) following CABG procedure   Prostatitis    PVD (peripheral vascular disease) ([name])    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 b/) residual RIGHT sided weakness   T2DM (type 2 diabetes mellitus) ([name])    Tobacco abuse    Past Surgical History:  Procedure Laterality Date   CARDIAC CATHETERIZATION N/A 12/07/2015   Procedure: Left Heart Cath and Coronary Angiography;  Surgeon: Wolm JINNY Rhyme, MD;  Location: [facility];  Service: Cardiovascular;  Laterality: N/A;   CAROTID ANGIOGRAPHY N/A 06/05/2022   Procedure: CAROTID ANGIOGRAPHY;  Surgeon: Marea Selinda RAMAN, MD;  Location: [facility];  Service: Cardiovascular;  Laterality: N/A;   CAROTID PTA/STENT INTERVENTION Left    HX: 2 stents   CATARACT EXTRACTION W/PHACO Left 03/24/2021   Procedure: CATARACT EXTRACTION PHACO AND INTRAOCULAR LENS PLACEMENT (  IOC) LEFT DIABETIC 6.67 00:42.5;  Surgeon: Jaye Fallow, MD;  Location: [facility];  Service: Ophthalmology;  Laterality: Left;   COLONOSCOPY WITH PROPOFOL  N/A 02/18/2019   Procedure: COLONOSCOPY WITH PROPOFOL ;  Surgeon: Jinny Carmine, MD;  Location: [facility];  Service: Endoscopy;  Laterality: N/A;   COLONOSCOPY WITH PROPOFOL  N/A 12/22/2021   Procedure: COLONOSCOPY WITH PROPOFOL ;  Surgeon: Jinny Carmine, MD;  Location: [facility];  Service: Endoscopy;  Laterality: N/A;   CORONARY ANGIOPLASTY WITH STENT PLACEMENT Left 2002   CORONARY ARTERY BYPASS GRAFT N/A 01/03/2016   Procedure: 3v CORONARY ARTERY BYPASS GRAFT (LIMA-LAD, SVG-PDA, SVG-OM2); Location: [facility];  Surgeon: Debby Nova, MD   CYSTOSCOPY W/ URETERAL STENT REMOVAL N/A 10/04/2021   Procedure: CYSTOSCOPY WITH PROSTATE FOREIGN BODY REMOVAL;  Surgeon: Twylla Glendia BROCKS, MD;  Location: [facility];  Service: Urology;  Laterality: N/A;   CYSTOSCOPY WITH INSERTION OF UROLIFT N/A 04/29/2019   Procedure: CYSTOSCOPY WITH INSERTION OF UROLIFT;  Surgeon: Twylla Glendia BROCKS, MD;  Location: [facility];  Service: Urology;  Laterality: N/A;   ESOPHAGOGASTRODUODENOSCOPY (EGD) WITH PROPOFOL  N/A 09/11/2016   Procedure: ESOPHAGOGASTRODUODENOSCOPY (EGD) WITH PROPOFOL ;  Surgeon: Carmine Jinny, MD;  Location: [facility];  Service: Endoscopy;  Laterality: N/A;   ESOPHAGOGASTRODUODENOSCOPY (EGD) WITH PROPOFOL  N/A 10/17/2016   Procedure: ESOPHAGOGASTRODUODENOSCOPY (EGD) WITH PROPOFOL ;  Surgeon: Carmine Jinny, MD;  Location: [facility];  Service: Endoscopy;  Laterality: N/A;   ESOPHAGOGASTRODUODENOSCOPY (EGD) WITH PROPOFOL  N/A 11/13/2017   Procedure: ESOPHAGOGASTRODUODENOSCOPY (EGD) WITH PROPOFOL ;  Surgeon: Jinny Carmine, MD;  Location: [facility];  Service: Endoscopy;  Laterality: N/A;   pins R lower leg Left    rotator cuff replaced Right    SAVORY DILATION N/A 10/17/2016   Procedure: SAVORY DILATION;  Surgeon: Carmine Jinny, MD;  Location: [facility];  Service: Endoscopy;  Laterality: N/A;   TRANSURETHRAL INCISION OF PROSTATE N/A 10/04/2021   Procedure: TRANSURETHRAL INCISION OF THE PROSTATE (TUIP);  Surgeon: Twylla Glendia BROCKS, MD;  Location: [facility];  Service: Urology;  Laterality: N/A;   Patient Active Problem List   Diagnosis Date Noted   Acute cystitis without hematuria 05/15/2023   Pseudomonas infection 05/15/2023   Pseudomonas urinary tract infection 05/14/2023   Left testicular pain 05/13/2023   Generalized weakness 05/13/2023   Cellulitis of scrotum 05/13/2023   Cerebellar stroke, acute ([name]) 05/13/2023   Cellulitis 05/12/2023   Cellulitis of groin 05/12/2023   Carotid stenosis, symptomatic,  with infarction ([name]) 06/05/2022   History of colonic polyps    Anxiety state 11/17/2021   Cognitive deficit, post-stroke 11/17/2021   Ex-smoker 11/17/2021   Bradycardia 02/03/2021   Chronic systolic CHF (congestive heart failure), NYHA class 2 ([name]) 05/27/2020   Accelerated junctional rhythm 04/21/2019   Encounter for screening colonoscopy    Polyp of colon    CAD (coronary artery disease) 01/14/2019   Depression 01/14/2019   GERD (gastroesophageal reflux disease) 01/14/2019   Periodic limb movement disorder (PLMD) 01/14/2019   Sleep apnea 01/14/2019   Type 2 diabetes mellitus without complications ([name]) 01/14/2019   Stricture and stenosis of esophagus    Problems with swallowing and mastication    Food impaction of esophagus    H/O coronary artery bypass surgery 01/17/2016   Cerebrovascular accident (CVA) due to embolism ([name]) 01/03/2016   Hypothyroidism, unspecified 01/03/2016   HLD (hyperlipidemia) 01/03/2016   Stable angina ([name]) 11/29/2015   Benign essential hypertension 05/27/2015   Bilateral carotid artery stenosis 02/24/2015   Atherosclerotic peripheral vascular disease ([name]) 09/07/2014   Bladder calculus 08/12/2014  Primary osteoarthritis of left knee 03/10/2014   Elevated prostate specific antigen (PSA) 02/05/2013   Snoring 01/22/2013   Sleepiness 01/22/2013   Dysphagia 01/22/2013   Occlusion and stenosis of carotid artery without mention of cerebral infarction 01/22/2013   Cerebral thrombosis with cerebral infarction ([name]) 01/22/2013   Benign prostatic hyperplasia with incomplete bladder emptying 08/26/2012   Chronic prostatitis 08/26/2012   Family history of malignant neoplasm of prostate 08/26/2012   Incomplete emptying of bladder 08/26/2012   Dysphagia 05/14/2012   Global aphasia 05/14/2012   Stroke, acute, thrombotic ([name]) 05/12/2012   Symptomatic carotid artery stenosis 05/12/2012   HTN (hypertension) 05/12/2012   COPD (chronic obstructive pulmonary  disease) ([name]) 05/12/2012   Acute respiratory failure ([name]) 05/12/2012    FROM INITIAL EVALUATION PCP: Don Lauraine Collar, NP  REFERRING PROVIDER: Don Lauraine Collar, *   REFERRING DIAG: I63.10 (ICD-10-CM) - Cerebral infarction due to embolism of unspecified precerebral artery S06.9XAA (ICD-10-CM) - Unspecified intracranial injury with loss of consciousness status unknown, initial encounter R47.01 (ICD-10-CM) - Aphasia S01.90XA (ICD-10-CM) - Unspecified open wound of unspecified part of head, initial encounter  RATIONALE FOR EVALUATION AND TREATMENT: Rehabilitation  THERAPY DIAG: Muscle weakness (generalized)  Unsteadiness on feet  ONSET DATE: 05/12/23  FOLLOW-UP APPT SCHEDULED WITH REFERRING PROVIDER: Yes    SUBJECTIVE:                                                                                                                                                                                         SUBJECTIVE STATEMENT:   Severe imbalance with R sided weakness;   PERTINENT HISTORY:  Pt referred to PT for unsteadiness s/p CVA. He was hospitalized at [facility] on 05/12/23-05/15/23 for chills, weakness, and left-sided testicular pain for several days. Noted baseline history of CVA with right-sided deficits. This was acutely worsened w/ his other sx. Urinalysis mildly indicative of infection. Troponin 19-28. COVID flu and RSV negative. Lactate within normal limits. CT head- negative acute intracranial abnormality. Old left MCA infarct with left frontal encephalomalacia (R sided weakness). Atrophy, chronic microvascular disease. Brain MRI read as punctate acute or subacute infarct in the right superior cerebellum. No associated hemorrhage or mass effect. Scrotal ultrasound showed bilateral hydroceles, simple on the right and complex (multiseptated) left, bilateral varicoceles, epididymides are enlarged and heterogeneous in appearance bilaterally, with multiple epididymal cysts and/or  spermatoceles, and multiple calcifications. No evidence of testicular torsion, or findings suggestive of epididymo-orchitis.Initially treated with IV rocephin , flagyl , vancomycin  as well as topical miconazole  cream. Neurology and urology were consulted and was admitted for further wrkp/management. Neurology did not recommend any further neurological workup and he was asymptomatic. Additionally,  patient was found to have significantly elevated blood pressures throughout his stay and was not on home therapy. He was started on entresto  given his CAD and HFpEF following permissive hypertensive period however he was taken off after his recent cardiology visit. Pt has had 3 falls since returning home from the hospital. He had a notable worsening of his balance with recent CVA. Recently finished speech therapy.   Pain: No Numbness/Tingling: No Focal Weakness: Yes, RUE/RLE weakness, mild R facial weakness Recent changes in overall health/medication: Yes Prior history of physical therapy for balance:  Yes, 2013 and L MCA infarct Dominant hand: right Imaging: Yes  Red flags: Negative for bowel/bladder changes, personal history of cancer, abdominal pain, chills/fever, night sweats, nausea, vomiting, unrelenting pain,  PRECAUTIONS: None  WEIGHT BEARING RESTRICTIONS: No  FALLS: Has patient fallen in last 6 months? Yes. 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   Patient Goals: Pt wants to be able to work in the yard, strengthen his R side, and improve his balance.     OBJECTIVE:   Patient Surveys  FOTO: 50, predicted improvement to 10   Cognition Patient is oriented to person, place, and time.  Recent memory is intact.  Remote memory is intact.  Attention span and concentration are intact.  Expressive aphasia noted; Patient's fund of knowledge is within normal limits for educational level.    Gross Musculoskeletal Assessment Tremor: None Bulk: Normal Tone: Normal  Posture: No gross abnormalities noted in standing or seated posture  AROM No gross deficits noted with functional activities  LE MMT: MMT (out of 5) Right  Left   Hip flexion 4+ 4+  Hip extension    Hip abduction (seated) 5 5  Hip adduction (seated) 5 5  Hip internal rotation 5 5  Hip external rotation 5 5  Knee flexion 5 5  Knee extension 5 5  Ankle dorsiflexion 5 5  Ankle plantarflexion Active Active  Ankle inversion    Ankle eversion    (* = pain; Blank rows = not tested)  Slight decrease in R shoulder flexion and R grip strength noted compared to left side  Sensation Grossly intact to light touch throughout bilateral LEs as determined by testing dermatomes L2-S2. Proprioception, stereognosis, and hot/cold testing deferred on this date.  Reflexes Deferred  Cranial Nerves Visual acuity and visual fields are intact  Extraocular muscles are intact  Facial sensation is intact bilaterally  Facial strength demonstrates slight droop in R corner of mouth  Hearing is normal as tested by gross conversation Palate elevates midline, normal phonation  Shoulder shrug strength is intact  Tongue protrudes midline  Coordination/Cerebellar Finger to Nose: Mildly dysmetric RUE Heel to Shin: Mildly dysmetric RLE Rapid alternating movements: WNL Finger Opposition: More difficult R hand Pronator Drift: Negative  Bed mobility: Deferred  Transfers: Assistive device utilized: None  Sit to stand: Complete Independence Stand to sit: Complete Independence Chair to  chair: Complete Independence Floor:  Not tested  Curb:  Not tested  Stairs: Level of Assistance: Modified independence Stair Negotiation Technique: Step to Pattern with Bilateral  Rails Number of Stairs: 4  Height of Stairs: 6  Comments: Utilized step-to pattern during descent only;  Gait: Gait pattern: decreased step length- Left and decreased stance time- Right Distance walked: 100 Assistive device utilized: None Level of assistance: Complete Independence Comments: Slight decrease stance time RLE, decreased self-selected speed  Functional Outcome Measures  Results Comments  BERG 49/56   DGI    FGA    TUG 9.5 seconds   5TSTS 11.8 seconds   6 Minute Walk Test    10 Meter Gait Speed    (Blank rows = not tested)   TODAY'S TREATMENT (08/09/23):      SUBJECTIVE: Pt reports that he is doing well today, but notes needing to rest upon standing before walking. No pain reported. No specific questions or concerns.    PAIN: None reported   Neuromuscular Re-education  6 forward step-ups without UE support x 10 BLE (pt struggles clearing foot up/down step) 6 forward toe taps without UE support x 60s BLE Blaze pods hand reaching in // bars with random setting, 6 pods, 2 cycles without distracting and 2 cycles with distracting, 60s/cycle (added airex for last 2 cycles) Forward high knee marching in // bars with contralateral hand to knee 2 x 2 lengths (motor planning very difficult for patient);   Ther-ex  NuStep L1-4 x 9 minutes for warm-up and BLE strengthening during interval history, therapist adjusting resistance and monitoring fatigue to ensure proper difficulty (5 minutes unbilled); Sit to stand with overhead 8# med ball press 2 x 10 with airex under feet Seated LAQ with 5# ankle weights 2 x 30s BLE;   Not performed: Heel/toe raises with 3s hold x 12 each direction; Standing semi tandem with one foot on Airex and other on 6 step x 60 seconds leading with each LE   Nautilus resisted gait 50# forward, backward, R lateral, and L lateral x 2 each direction; Tandem balance alternating forward LE horizontal and vertical head turns x 30s each Blaze pods with random setting, 5 pods with 4 distracting colors, 2 cycles, 60s/cycle. Star toe taps from 9 to 3 o'clock, significantly more difficult for patient due to the cognitive challenge; Total Gym (TG) Level 22 (L22) double leg squats x 15; TG L22 single leg squats 2 x 10 BLE;  Cross-over stepping in // bars with faded UE support x 4 lengths; Airex static balance eyes open/closed x 30s each; Forward gait in // bars without UE support with 6 lateral cone taps; Tandem balance alternating forward LE x 30s each; Tandem gait in // bars without UE support x multiple lengths; Feet together (FT) eyes open/closed x 30s each; TRX squats 2 x 15; Forward BOSU lunges x 10 with each foot forward; Airex alternating 12 step taps without UE support x 10 BLE; Airex feet apart ball passes around body to therapist with return pass on opposite side x multiple bouts on each side; Obstacle course with 6 hurdles and Airex pad step up/down in // bars without UE support x multiple lengths;   PATIENT EDUCATION:  Education details: Outcome measures. Pt educated throughout session about proper posture and technique with exercises. Improved exercise technique, movement at target joints, use of target muscles after min to mod verbal, visual, tactile cues.  Person educated: Patient Education method: Explanation Education comprehension: verbalized understanding   HOME EXERCISE PROGRAM:  Access Code: [email or web address] URL: [email or web address] Date: 07/03/2023 Prepared by: Selinda Eck  Exercises - Seated March with Resistance  - 1 x daily -  7 x weekly - 3 sets - 30s hold - Seated Hip Adduction Isometrics with Ball  - 1 x daily - 7 x weekly - 3 sets - 30s hold - Seated Hip Abduction with Resistance  - 1 x daily - 7 x weekly  - 3 sets - 30s hold - Seated Long Arc Quad  - 1 x daily - 7 x weekly - 3 sets - 30s hold - Mini Squat with Counter Support  - 1 x daily - 7 x weekly - 3 sets - 30s hold - Heel Raises with Counter Support  - 1 x daily - 7 x weekly - 3 sets - 30s hold   ASSESSMENT:  CLINICAL IMPRESSION:   Patient arrives to treatment session motivated to participate. Session focused on BLE strengthening and balance. Patient had balance unsteadiness with fully clearing the right foot during step-ups. Gait belt donned throughout session. He continues to struggle with motor planning with contralateral hand to high knee marches as well as aphasia making feedback difficult. No HEP updates today. Patient will continue to benefit from skilled therapy to address remaining deficits in order to improve quality of life and return to prior level of function.  OBJECTIVE IMPAIRMENTS: Abnormal gait, decreased balance, decreased coordination, difficulty walking, and decreased strength.   ACTIVITY LIMITATIONS: carrying, lifting, bending, squatting, stairs, and transfers  PARTICIPATION LIMITATIONS: meal prep, cleaning, shopping, community activity, and yard work  PERSONAL FACTORS: Age and 3+ comorbidities: aphasia, CAD, carotid stenosis, prior CVA, CHF, and COPD  are also affecting patient's functional outcome.   REHAB POTENTIAL: Good  CLINICAL DECISION MAKING: Unstable/unpredictable  EVALUATION COMPLEXITY: High   GOALS: Goals reviewed with patient? No  SHORT TERM GOALS: Target date: 08/10/2023  Pt will be independent with HEP in order to improve strength and balance in order to decrease fall risk and improve function at home. Baseline:  Goal status: INITIAL   LONG TERM GOALS: Target date: 08/16/2023  Pt will increase FOTO to at least 62 to demonstrate significant improvement in function at home related to balance  Baseline: 50; 07/31/23: 46 Goal status: ONGOING  2.  Pt will improve BERG by at least 3 points in  order to demonstrate clinically significant improvement in balance.   Baseline: 06/21/23: 49/56, 07/31/23: 51/56;  Goal status: PARTIALLY MET  3. Pt will improve DGI by at least 3 points in order to demonstrate clinically significant improvement in balance and decreased risk for falls.     Baseline: 14/24, 07/31/23: 17/24; Goal status: ACHIEVED  4. Pt will increase by at least 61m (168ft) in order to demonstrate clinically significant improvement in cardiopulmonary endurance and community ambulation   Baseline: 8' without assistive device. Occasional R foot shuffling, 07/31/23: 940' without assistive device, occasional R foot shuffling; Goal status: PARTIALLY MET   PLAN: PT FREQUENCY: 2x/week  PT DURATION: 8 weeks  PLANNED INTERVENTIONS: Therapeutic exercises, Therapeutic activity, Neuromuscular re-education, Balance training, Gait training, Patient/Family education, Self Care, Joint mobilization, Joint manipulation, Vestibular training, Canalith repositioning, Orthotic/Fit training, DME instructions, Dry Needling, Electrical stimulation, Spinal manipulation, Spinal mobilization, Cryotherapy, Moist heat, Taping, Traction, Ultrasound, Ionotophoresis 4mg /ml Dexamethasone , Manual therapy, and Re-evaluation.  PLAN FOR NEXT SESSION: Progress balance and strengthening exercises, modify/review HEP (add balance exercises) as necessary;  Stetson Pelaez, SPT [employer] [facility]   Jason D Huprich PT, DPT, GCS  Huprich,Jason, PT, DPT  08/09/2023, 5:56 PM

## 2023-08-13 ENCOUNTER — Encounter: Payer: Self-pay | Admitting: Emergency Medicine

## 2023-08-15 ENCOUNTER — Ambulatory Visit: Payer: Medicare HMO

## 2023-08-15 DIAGNOSIS — R2681 Unsteadiness on feet: Secondary | ICD-10-CM

## 2023-08-15 DIAGNOSIS — M6281 Muscle weakness (generalized): Secondary | ICD-10-CM

## 2023-08-15 NOTE — Therapy (Signed)
 OUTPATIENT PHYSICAL THERAPY BALANCE TREATMENT   Patient Name: TALIESIN STANO MRN: [id number] DOB:October 15, 1945, 78 y.o., male Today's Date: 08/15/2023  END OF SESSION:  PT End of Session - 08/15/23 1052     Visit Number 14    Number of Visits 33    Date for PT Re-Evaluation 10/10/23    Authorization Type eval: 06/21/23, recert: 08/15/23    PT Start Time 1100    PT Stop Time 1145    PT Time Calculation (min) 45 min    Equipment Utilized During Treatment Gait belt    Activity Tolerance Patient tolerated treatment well            Past Medical History:  Diagnosis Date   Accelerated junctional rhythm    Anemia    Anxiety    a.) Tx'd with BZO PRN   Arthritis    Bilateral carotid artery disease ([name])    BPH (benign prostatic hypertrophy)    CAD S/P percutaneous coronary angioplasty    a.) PCI in 2002 placing a RCA stent (unknown type). b.) LHC 12/07/2015: 5% ISR m-dRCA, 90% dRCA, 25% pRCA, 25% p-mLCx, 100% OM3, 95% oOM2-OM2, 75% m-dLAD; refer to CVTS. c.) 3v CABG 01/03/2016   CHF (congestive heart failure) ([name])    a.) TTE 05/12/2012: EF 50-55%; mild LA enlargement; G1DD. b.)  TTE 11/26/2015: EF 45%; mild BAE; triv PR, mild MR/TR; inferolateral HK; G1DD. c.)  TTE 11/07/2017: EF 35%; RV enlargement; BAE; inferior and inferolateral HK; triv PR; mild MR/TR. d.)  TTE 09/17/2019: EF 35%; mild LVH; triv MR/TR. e.)  TTE 08/11/2021: EF 50%; triv MR/TR; G1DD.   Cognitive deficit following cerebrovascular accident (CVA)    COPD (chronic obstructive pulmonary disease) ([name])    Cortical cataract    Depression    Dyspnea    GERD (gastroesophageal reflux disease)    History of 2019 novel coronavirus disease (COVID-19) 01/19/2021   History of acute inferior wall MI 2002   a.) PCI was performed placing a RCA stent (unknown type)   History of kidney stones    HTN (hypertension)    Hyperlipidemia    Hypothyroidism    Long term current use of antithrombotics/antiplatelets    a.) DAPT therapy  (ASA + clopidogrel )   OSA (obstructive sleep apnea)    a.) does not require nocturnal PAP therapy   PLMD (periodic limb movement disorder)    Postoperative atrial fibrillation ([name]) 01/03/2016   a.) following CABG procedure   Prostatitis    PVD (peripheral vascular disease) ([name])    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 b/) residual RIGHT sided weakness   T2DM (type 2 diabetes mellitus) ([name])    Tobacco abuse    Past Surgical History:  Procedure Laterality Date   CARDIAC CATHETERIZATION N/A 12/07/2015   Procedure: Left Heart Cath and Coronary Angiography;  Surgeon: Michelle Aid, MD;  Location: [facility];  Service: Cardiovascular;  Laterality: N/A;   CAROTID ANGIOGRAPHY N/A 06/05/2022   Procedure: CAROTID ANGIOGRAPHY;  Surgeon: Celso College, MD;  Location: [facility];  Service: Cardiovascular;  Laterality: N/A;   CAROTID PTA/STENT INTERVENTION Left    HX: 2 stents   CATARACT EXTRACTION W/PHACO Left 03/24/2021   Procedure: CATARACT EXTRACTION PHACO AND INTRAOCULAR  LENS PLACEMENT (IOC) LEFT DIABETIC 6.67 00:42.5;  Surgeon: Clair Crews, MD;  Location: [facility];  Service: Ophthalmology;  Laterality: Left;   COLONOSCOPY WITH PROPOFOL  N/A 02/18/2019   Procedure: COLONOSCOPY WITH PROPOFOL ;  Surgeon: Marnee Sink, MD;  Location: [facility];  Service: Endoscopy;  Laterality: N/A;   COLONOSCOPY WITH PROPOFOL  N/A 12/22/2021   Procedure: COLONOSCOPY WITH PROPOFOL ;  Surgeon: Marnee Sink, MD;  Location: [facility];  Service: Endoscopy;  Laterality: N/A;   CORONARY ANGIOPLASTY WITH STENT PLACEMENT Left 2002   CORONARY ARTERY BYPASS GRAFT N/A 01/03/2016   Procedure: 3v CORONARY ARTERY BYPASS GRAFT (LIMA-LAD, SVG-PDA, SVG-OM2);  Location: [facility]; Surgeon: Lindley Rhein, MD   CYSTOSCOPY W/ URETERAL STENT REMOVAL N/A 10/04/2021   Procedure: CYSTOSCOPY WITH PROSTATE FOREIGN BODY REMOVAL;  Surgeon: Geraline Knapp, MD;  Location: [facility];  Service: Urology;  Laterality: N/A;   CYSTOSCOPY WITH INSERTION OF UROLIFT N/A 04/29/2019   Procedure: CYSTOSCOPY WITH INSERTION OF UROLIFT;  Surgeon: Geraline Knapp, MD;  Location: [facility];  Service: Urology;  Laterality: N/A;   ESOPHAGOGASTRODUODENOSCOPY (EGD) WITH PROPOFOL  N/A 09/11/2016   Procedure: ESOPHAGOGASTRODUODENOSCOPY (EGD) WITH PROPOFOL ;  Surgeon: Marnee Sink, MD;  Location: [facility];  Service: Endoscopy;  Laterality: N/A;   ESOPHAGOGASTRODUODENOSCOPY (EGD) WITH PROPOFOL  N/A 10/17/2016   Procedure: ESOPHAGOGASTRODUODENOSCOPY (EGD) WITH PROPOFOL ;  Surgeon: Marnee Sink, MD;  Location: [facility];  Service: Endoscopy;  Laterality: N/A;   ESOPHAGOGASTRODUODENOSCOPY (EGD) WITH PROPOFOL  N/A 11/13/2017   Procedure: ESOPHAGOGASTRODUODENOSCOPY (EGD) WITH PROPOFOL ;  Surgeon: Marnee Sink, MD;  Location: [facility];  Service: Endoscopy;  Laterality: N/A;   pins R lower leg Left    rotator cuff replaced Right    SAVORY DILATION N/A 10/17/2016   Procedure: SAVORY DILATION;  Surgeon: Marnee Sink, MD;  Location: [facility];  Service: Endoscopy;  Laterality: N/A;   TRANSURETHRAL INCISION OF PROSTATE N/A 10/04/2021   Procedure: TRANSURETHRAL INCISION OF THE PROSTATE (TUIP);  Surgeon: Geraline Knapp, MD;  Location: [facility];  Service: Urology;  Laterality: N/A;   Patient Active Problem List   Diagnosis Date Noted   Acute cystitis without hematuria 05/15/2023   Pseudomonas infection 05/15/2023   Pseudomonas urinary tract infection 05/14/2023   Left testicular pain 05/13/2023   Generalized weakness 05/13/2023   Cellulitis of scrotum 05/13/2023   Cerebellar stroke, acute ([name]) 05/13/2023   Cellulitis 05/12/2023   Cellulitis of groin 05/12/2023   Carotid stenosis,  symptomatic, with infarction ([name]) 06/05/2022   History of colonic polyps    Anxiety state 11/17/2021   Cognitive deficit, post-stroke 11/17/2021   Ex-smoker 11/17/2021   Bradycardia 02/03/2021   Chronic systolic CHF (congestive heart failure), NYHA class 2 ([name]) 05/27/2020   Accelerated junctional rhythm 04/21/2019   Encounter for screening colonoscopy    Polyp of colon    CAD (coronary artery disease) 01/14/2019   Depression 01/14/2019   GERD (gastroesophageal reflux disease) 01/14/2019   Periodic limb movement disorder (PLMD) 01/14/2019   Sleep apnea 01/14/2019   Type 2 diabetes mellitus without complications ([name]) 01/14/2019   Stricture and stenosis of esophagus    Problems with swallowing and mastication    Food impaction of esophagus    H/O coronary artery bypass surgery 01/17/2016   Cerebrovascular accident (CVA) due to embolism ([name]) 01/03/2016   Hypothyroidism, unspecified 01/03/2016   HLD (hyperlipidemia) 01/03/2016   Stable angina ([name]) 11/29/2015   Benign essential hypertension 05/27/2015   Bilateral carotid artery stenosis 02/24/2015   Atherosclerotic peripheral vascular disease ([name]) 09/07/2014   Bladder calculus 08/12/2014  Primary osteoarthritis of left knee 03/10/2014   Elevated prostate specific antigen (PSA) 02/05/2013   Snoring 01/22/2013   Sleepiness 01/22/2013   Dysphagia 01/22/2013   Occlusion and stenosis of carotid artery without mention of cerebral infarction 01/22/2013   Cerebral thrombosis with cerebral infarction ([name]) 01/22/2013   Benign prostatic hyperplasia with incomplete bladder emptying 08/26/2012   Chronic prostatitis 08/26/2012   Family history of malignant neoplasm of prostate 08/26/2012   Incomplete emptying of bladder 08/26/2012   Dysphagia 05/14/2012   Global aphasia 05/14/2012   Stroke, acute, thrombotic ([name]) 05/12/2012   Symptomatic carotid artery stenosis 05/12/2012   HTN (hypertension) 05/12/2012   COPD (chronic obstructive  pulmonary disease) ([name]) 05/12/2012   Acute respiratory failure ([name]) 05/12/2012    FROM INITIAL EVALUATION PCP: Deliah Fells, NP  REFERRING PROVIDER: Deliah Fells, *   REFERRING DIAG: I63.10 (ICD-10-CM) - Cerebral infarction due to embolism of unspecified precerebral artery S06.9XAA (ICD-10-CM) - Unspecified intracranial injury with loss of consciousness status unknown, initial encounter R47.01 (ICD-10-CM) - Aphasia S01.90XA (ICD-10-CM) - Unspecified open wound of unspecified part of head, initial encounter  RATIONALE FOR EVALUATION AND TREATMENT: Rehabilitation  THERAPY DIAG: Muscle weakness (generalized)  Unsteadiness on feet  ONSET DATE: 05/12/23  FOLLOW-UP APPT SCHEDULED WITH REFERRING PROVIDER: Yes    SUBJECTIVE:                                                                                                                                                                                         SUBJECTIVE STATEMENT:   Severe imbalance with R sided weakness;   PERTINENT HISTORY:  Pt referred to PT for unsteadiness s/p CVA. He was hospitalized at [facility] on 05/12/23-05/15/23 for chills, weakness, and left-sided testicular pain for several days. Noted baseline history of CVA with right-sided deficits. This was acutely worsened w/ his other sx. Urinalysis mildly indicative of infection. Troponin 19-28. COVID flu and RSV negative. Lactate within normal limits. CT head- negative acute intracranial abnormality. Old left MCA infarct with left frontal encephalomalacia (R sided weakness). Atrophy, chronic microvascular disease. Brain MRI read as punctate acute or subacute infarct in the right superior cerebellum. No associated hemorrhage or mass effect. Scrotal ultrasound showed bilateral hydroceles, simple on the right and complex (multiseptated) left, bilateral varicoceles, epididymides are enlarged and heterogeneous in appearance bilaterally, with multiple epididymal cysts  and/or spermatoceles, and multiple calcifications. No evidence of testicular torsion, or findings suggestive of epididymo-orchitis.Initially treated with IV rocephin , flagyl , vancomycin  as well as topical miconazole  cream. Neurology and urology were consulted and was admitted for further wrkp/management. Neurology did not recommend any further neurological workup and he was asymptomatic. Additionally,  patient was found to have significantly elevated blood pressures throughout his stay and was not on home therapy. He was started on entresto  given his CAD and HFpEF following permissive hypertensive period however he was taken off after his recent cardiology visit. Pt has had 3 falls since returning home from the hospital. He had a notable worsening of his balance with recent CVA. Recently finished speech therapy.   Pain: No Numbness/Tingling: No Focal Weakness: Yes, RUE/RLE weakness, mild R facial weakness Recent changes in overall health/medication: Yes Prior history of physical therapy for balance:  Yes, 2013 and L MCA infarct Dominant hand: right Imaging: Yes  Red flags: Negative for bowel/bladder changes, personal history of cancer, abdominal pain, chills/fever, night sweats, nausea, vomiting, unrelenting pain,  PRECAUTIONS: None  WEIGHT BEARING RESTRICTIONS: No  FALLS: Has patient fallen in last 6 months? Yes. 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   Patient Goals: Pt wants to be able to work in the yard, strengthen his R side, and improve his  balance.    OBJECTIVE:   Patient Surveys  FOTO: 50, predicted improvement to 58   Cognition Patient is oriented to person, place, and time.  Recent memory is intact.  Remote memory is intact.  Attention span and concentration are intact.  Expressive aphasia noted; Patient's fund of knowledge is within normal limits for educational level.    Gross Musculoskeletal Assessment Tremor: None Bulk: Normal Tone: Normal  Posture: No gross abnormalities noted in standing or seated posture  AROM No gross deficits noted with functional activities  LE MMT: MMT (out of 5) Right  Left   Hip flexion 4+ 4+  Hip extension    Hip abduction (seated) 5 5  Hip adduction (seated) 5 5  Hip internal rotation 5 5  Hip external rotation 5 5  Knee flexion 5 5  Knee extension 5 5  Ankle dorsiflexion 5 5  Ankle plantarflexion Active Active  Ankle inversion    Ankle eversion    (* = pain; Blank rows = not tested)  Slight decrease in R shoulder flexion and R grip strength noted compared to left side  Sensation Grossly intact to light touch throughout bilateral LEs as determined by testing dermatomes L2-S2. Proprioception, stereognosis, and hot/cold testing deferred on this date.  Reflexes Deferred  Cranial Nerves Visual acuity and visual fields are intact  Extraocular muscles are intact  Facial sensation is intact bilaterally  Facial strength demonstrates slight droop in R corner of mouth  Hearing is normal as tested by gross conversation Palate elevates midline, normal phonation  Shoulder shrug strength is intact  Tongue protrudes midline  Coordination/Cerebellar Finger to Nose: Mildly dysmetric RUE Heel to Shin: Mildly dysmetric RLE Rapid alternating movements: WNL Finger Opposition: More difficult R hand Pronator Drift: Negative  Bed mobility: Deferred  Transfers: Assistive device utilized: None  Sit to stand: Complete Independence Stand to sit: Complete  Independence Chair to chair: Complete Independence Floor:  Not tested  Curb:  Not tested  Stairs: Level of Assistance: Modified independence Stair Negotiation Technique: Step to Pattern with Bilateral  Rails Number of Stairs: 4  Height of Stairs: 6"  Comments: Utilized step-to pattern during descent only;  Gait: Gait pattern: decreased step length- Left and decreased stance time- Right Distance walked: 100 Assistive device utilized: None Level of assistance: Complete Independence Comments: Slight decrease stance time RLE, decreased self-selected speed  Functional Outcome Measures  Results Comments  BERG 49/56   DGI    FGA    TUG 9.5 seconds   5TSTS 11.8 seconds   6 Minute Walk Test    10 Meter Gait Speed    (Blank rows = not tested)   TODAY'S TREATMENT (08/15/23):      SUBJECTIVE: Pt reports that he is doing well today. No significant changes since the last therapy session. No pain reported. No specific questions or concerns.    PAIN: None reported   Neuromuscular Re-education  Airex static balance feet apart eyes open/closed x 30s each; Airex balance feet apart eyes open horizontal and vertical head turns x 30s each; Airex static balance feet together eyes open/closed x 30s each; Airex balance feet together eyes open horizontal and vertical head turns x 30s each; Airex balance feet together eyes open ball passes around body with return pass on opposite side, therapist varying height of return pass x multiple bouts toward each side; Airex  Heel/toe raises with 3s hold x 12 each direction; Airex alternating 6" step taps without UE support x 10 BLE; Airex staggerred balance with front foot on 6" step without UE support x 60s BLE;   Ther-ex  NuStep L1-5 x 10 minutes for warm-up and BLE strengthening during interval history, therapist adjusting resistance and monitoring fatigue to ensure proper difficulty (5 minutes unbilled); Standing mini squats with BUE support x  15; Resisted gait in // bars with double black tband x 5 each direction;   Not performed: Nautilus resisted gait 50# forward, backward, R lateral, and L lateral x 2 each direction; Tandem balance alternating forward LE horizontal and vertical head turns x 30s each Blaze pods with random setting, 5 pods with 4 distracting colors, 2 cycles, 60s/cycle. Star toe taps from 9 to 3 o'clock, significantly more difficult for patient due to the cognitive challenge; Total Gym (TG) Level 22 (L22) double leg squats x 15; TG L22 single leg squats 2 x 10 BLE;  Cross-over stepping in // bars with faded UE support x 4 lengths; Forward gait in // bars without UE support with 6" lateral cone taps; Tandem balance alternating forward LE x 30s each; Tandem gait in // bars without UE support x multiple lengths; TRX squats 2 x 15; Forward BOSU lunges x 10 with each foot forward; 6" forward step-ups without UE support x 10 BLE (pt struggles clearing foot up/down step) Blaze pods hand reaching in // bars with random setting, 6 pods, 2 cycles without distracting and 2 cycles with distracting, 60s/cycle (added airex for last 2 cycles) Forward high knee marching in // bars with contralateral hand to knee 2 x 2 lengths (motor planning very difficult for patient); Obstacle course with 6" hurdles and Airex pad step up/down in // bars without UE support x multiple lengths;   PATIENT EDUCATION:  Education details: Pt educated throughout session about proper posture and technique with exercises. Improved exercise technique, movement at target joints, use of target muscles after min to mod verbal, visual, tactile cues. Balance exercises Person educated: Patient Education method: Explanation Education comprehension: verbalized understanding   HOME EXERCISE PROGRAM:  Access Code: [email or web address] URL: [email or web address] Date:  07/03/2023 Prepared by: Crawford Dock  Exercises - Seated March with Resistance  - 1  x daily - 7 x weekly - 3 sets - 30s hold - Seated Hip Adduction Isometrics with Ball  - 1 x daily - 7 x weekly - 3 sets - 30s hold - Seated Hip Abduction with Resistance  - 1 x daily - 7 x weekly - 3 sets - 30s hold - Seated Long Arc Quad  - 1 x daily - 7 x weekly - 3 sets - 30s hold - Mini Squat with Counter Support  - 1 x daily - 7 x weekly - 3 sets - 30s hold - Heel Raises with Counter Support  - 1 x daily - 7 x weekly - 3 sets - 30s hold   ASSESSMENT:  CLINICAL IMPRESSION:   Updated outcome measures with patient during 07/31/23 visit. At that time his BERG, DGI, and all improved. Unfortunately his subjective appraisal worsened as demonstrated by a lower FOTO score. Overall pt is making excellent progress toward his goals. His endurance and balance have improved since the initial evaluation. Progressed balance strengthening exercises during session. No HEP updates today. Plan to progress strengthening and balance exercises Patient will continue to benefit from skilled therapy to address remaining deficits in order to improve quality of life and return to prior level of function.  OBJECTIVE IMPAIRMENTS: Abnormal gait, decreased balance, decreased coordination, difficulty walking, and decreased strength.   ACTIVITY LIMITATIONS: carrying, lifting, bending, squatting, stairs, and transfers  PARTICIPATION LIMITATIONS: meal prep, cleaning, shopping, community activity, and yard work  PERSONAL FACTORS: Age and 3+ comorbidities: aphasia, CAD, carotid stenosis, prior CVA, CHF, and COPD  are also affecting patient's functional outcome.   REHAB POTENTIAL: Good  CLINICAL DECISION MAKING: Unstable/unpredictable  EVALUATION COMPLEXITY: High   GOALS: Goals reviewed with patient? No  SHORT TERM GOALS: Target date: 08/10/2023  Pt will be independent with HEP in order to improve strength and balance in order to decrease fall risk and improve function at home. Baseline:  Goal status:  INITIAL   LONG TERM GOALS: Target date: 08/16/2023  Pt will increase FOTO to at least 62 to demonstrate significant improvement in function at home related to balance  Baseline: 50; 07/31/23: 46 Goal status: ONGOING  2.  Pt will improve BERG by at least 3 points in order to demonstrate clinically significant improvement in balance.   Baseline: 06/21/23: 49/56, 07/31/23: 51/56;  Goal status: PARTIALLY MET  3. Pt will improve DGI by at least 3 points in order to demonstrate clinically significant improvement in balance and decreased risk for falls.     Baseline: 14/24, 07/31/23: 17/24; Goal status: ACHIEVED  4. Pt will increase by at least 15m (11ft) in order to demonstrate clinically significant improvement in cardiopulmonary endurance and community ambulation   Baseline: 64' without assistive device. Occasional R foot shuffling, 07/31/23: 940' without assistive device, occasional R foot shuffling; Goal status: PARTIALLY MET   PLAN: PT FREQUENCY: 2x/week  PT DURATION: 8 weeks  PLANNED INTERVENTIONS: Therapeutic exercises, Therapeutic activity, Neuromuscular re-education, Balance training, Gait training, Patient/Family education, Self Care, Joint mobilization, Joint manipulation, Vestibular training, Canalith repositioning, Orthotic/Fit training, DME instructions, Dry Needling, Electrical stimulation, Spinal manipulation, Spinal mobilization, Cryotherapy, Moist heat, Taping, Traction, Ultrasound, Ionotophoresis 4mg /ml Dexamethasone , Manual therapy, and Re-evaluation.  PLAN FOR NEXT SESSION: Progress balance and strengthening exercises, modify/review HEP (add balance exercises) as necessary;   Candi Chafe PT, DPT, GCS  Ajane Novella, PT, DPT  08/15/2023, 2:23 PM

## 2023-08-23 ENCOUNTER — Ambulatory Visit: Payer: Medicare HMO

## 2023-08-23 DIAGNOSIS — R2681 Unsteadiness on feet: Secondary | ICD-10-CM | POA: Diagnosis not present

## 2023-08-23 DIAGNOSIS — M6281 Muscle weakness (generalized): Secondary | ICD-10-CM

## 2023-08-23 NOTE — Therapy (Signed)
OUTPATIENT PHYSICAL THERAPY BALANCE TREATMENT   Patient Name: Paul Bass MRN: [id number] DOB:02/22/1946, 78 y.o., male Today's Date: 08/24/2023  END OF SESSION:  PT End of Session - 08/23/23 1528     Visit Number 15    Number of Visits 33    Date for PT Re-Evaluation 10/10/23    Authorization Type eval: 06/21/23, recert: 08/15/23    PT Start Time 1530    PT Stop Time 1615    PT Time Calculation (min) 45 min    Equipment Utilized During Treatment Gait belt    Activity Tolerance Patient tolerated treatment well            Past Medical History:  Diagnosis Date   Accelerated junctional rhythm    Anemia    Anxiety    a.) Tx'd with BZO PRN   Arthritis    Bilateral carotid artery disease ([name])    BPH (benign prostatic hypertrophy)    CAD S/P percutaneous coronary angioplasty    a.) PCI in 2002 placing a RCA stent (unknown type). b.) LHC 12/07/2015: 5% ISR m-dRCA, 90% dRCA, 25% pRCA, 25% p-mLCx, 100% OM3, 95% oOM2-OM2, 75% m-dLAD; refer to CVTS. c.) 3v CABG 01/03/2016   CHF (congestive heart failure) ([name])    a.) TTE 05/12/2012: EF 50-55%; mild LA enlargement; G1DD. b.)  TTE 11/26/2015: EF 45%; mild BAE; triv PR, mild MR/TR; inferolateral HK; G1DD. c.)  TTE 11/07/2017: EF 35%; RV enlargement; BAE; inferior and inferolateral HK; triv PR; mild MR/TR. d.)  TTE 09/17/2019: EF 35%; mild LVH; triv MR/TR. e.)  TTE 08/11/2021: EF 50%; triv MR/TR; G1DD.   Cognitive deficit following cerebrovascular accident (CVA)    COPD (chronic obstructive pulmonary disease) ([name])    Cortical cataract    Depression    Dyspnea    GERD (gastroesophageal reflux disease)    History of 2019 novel coronavirus disease (COVID-19) 01/19/2021   History of acute inferior wall MI 2002   a.) PCI was performed placing a RCA stent (unknown type)   History of kidney stones    HTN (hypertension)    Hyperlipidemia    Hypothyroidism    Long term current use of antithrombotics/antiplatelets    a.) DAPT therapy  (ASA + clopidogrel)   OSA (obstructive sleep apnea)    a.) does not require nocturnal PAP therapy   PLMD (periodic limb movement disorder)    Postoperative atrial fibrillation ([name]) 01/03/2016   a.) following CABG procedure   Prostatitis    PVD (peripheral vascular disease) ([name])    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 b/) residual RIGHT sided weakness   T2DM (type 2 diabetes mellitus) ([name])    Tobacco abuse    Past Surgical History:  Procedure Laterality Date   CARDIAC CATHETERIZATION N/A 12/07/2015   Procedure: Left Heart Cath and Coronary Angiography;  Surgeon: Lamar Blinks, MD;  Location: [facility];  Service: Cardiovascular;  Laterality: N/A;   CAROTID ANGIOGRAPHY N/A 06/05/2022   Procedure: CAROTID ANGIOGRAPHY;  Surgeon: Annice Needy, MD;  Location: [facility];  Service: Cardiovascular;  Laterality: N/A;   CAROTID PTA/STENT INTERVENTION Left    HX: 2 stents   CATARACT EXTRACTION W/PHACO Left 03/24/2021   Procedure: CATARACT EXTRACTION PHACO AND INTRAOCULAR  LENS PLACEMENT (IOC) LEFT DIABETIC 6.67 00:42.5;  Surgeon: Galen Manila, MD;  Location: [facility];  Service: Ophthalmology;  Laterality: Left;   COLONOSCOPY WITH PROPOFOL N/A 02/18/2019   Procedure: COLONOSCOPY WITH PROPOFOL;  Surgeon: Midge Minium, MD;  Location: [facility] ENDOSCOPY;  Service: Endoscopy;  Laterality: N/A;   COLONOSCOPY WITH PROPOFOL N/A 12/22/2021   Procedure: COLONOSCOPY WITH PROPOFOL;  Surgeon: Midge Minium, MD;  Location: [facility] ENDOSCOPY;  Service: Endoscopy;  Laterality: N/A;   CORONARY ANGIOPLASTY WITH STENT PLACEMENT Left 2002   CORONARY ARTERY BYPASS GRAFT N/A 01/03/2016   Procedure: 3v CORONARY ARTERY BYPASS GRAFT (LIMA-LAD, SVG-PDA, SVG-OM2);  Location: [facility]; Surgeon: Cain Sieve, MD   CYSTOSCOPY W/ URETERAL STENT REMOVAL N/A 10/04/2021   Procedure: CYSTOSCOPY WITH PROSTATE FOREIGN BODY REMOVAL;  Surgeon: Riki Altes, MD;  Location: [facility];  Service: Urology;  Laterality: N/A;   CYSTOSCOPY WITH INSERTION OF UROLIFT N/A 04/29/2019   Procedure: CYSTOSCOPY WITH INSERTION OF UROLIFT;  Surgeon: Riki Altes, MD;  Location: [facility];  Service: Urology;  Laterality: N/A;   ESOPHAGOGASTRODUODENOSCOPY (EGD) WITH PROPOFOL N/A 09/11/2016   Procedure: ESOPHAGOGASTRODUODENOSCOPY (EGD) WITH PROPOFOL;  Surgeon: Midge Minium, MD;  Location: [facility] ENDOSCOPY;  Service: Endoscopy;  Laterality: N/A;   ESOPHAGOGASTRODUODENOSCOPY (EGD) WITH PROPOFOL N/A 10/17/2016   Procedure: ESOPHAGOGASTRODUODENOSCOPY (EGD) WITH PROPOFOL;  Surgeon: Midge Minium, MD;  Location: [facility] ENDOSCOPY;  Service: Endoscopy;  Laterality: N/A;   ESOPHAGOGASTRODUODENOSCOPY (EGD) WITH PROPOFOL N/A 11/13/2017   Procedure: ESOPHAGOGASTRODUODENOSCOPY (EGD) WITH PROPOFOL;  Surgeon: Midge Minium, MD;  Location: [facility] ENDOSCOPY;  Service: Endoscopy;  Laterality: N/A;   pins R lower leg Left    rotator cuff replaced Right    SAVORY DILATION N/A 10/17/2016   Procedure: SAVORY DILATION;  Surgeon: Midge Minium, MD;  Location: [facility] ENDOSCOPY;  Service: Endoscopy;  Laterality: N/A;   TRANSURETHRAL INCISION OF PROSTATE N/A 10/04/2021   Procedure: TRANSURETHRAL INCISION OF THE PROSTATE (TUIP);  Surgeon: Riki Altes, MD;  Location: [facility];  Service: Urology;  Laterality: N/A;   Patient Active Problem List   Diagnosis Date Noted   Acute cystitis without hematuria 05/15/2023   Pseudomonas infection 05/15/2023   Pseudomonas urinary tract infection 05/14/2023   Left testicular pain 05/13/2023   Generalized weakness 05/13/2023   Cellulitis of scrotum 05/13/2023   Cerebellar stroke, acute ([name]) 05/13/2023   Cellulitis 05/12/2023   Cellulitis of groin 05/12/2023   Carotid stenosis,  symptomatic, with infarction ([name]) 06/05/2022   History of colonic polyps    Anxiety state 11/17/2021   Cognitive deficit, post-stroke 11/17/2021   Ex-smoker 11/17/2021   Bradycardia 02/03/2021   Chronic systolic CHF (congestive heart failure), NYHA class 2 ([name]) 05/27/2020   Accelerated junctional rhythm 04/21/2019   Encounter for screening colonoscopy    Polyp of colon    CAD (coronary artery disease) 01/14/2019   Depression 01/14/2019   GERD (gastroesophageal reflux disease) 01/14/2019   Periodic limb movement disorder (PLMD) 01/14/2019   Sleep apnea 01/14/2019   Type 2 diabetes mellitus without complications ([name]) 01/14/2019   Stricture and stenosis of esophagus    Problems with swallowing and mastication    Food impaction of esophagus    H/O coronary artery bypass surgery 01/17/2016   Cerebrovascular accident (CVA) due to embolism ([name]) 01/03/2016   Hypothyroidism, unspecified 01/03/2016   HLD (hyperlipidemia) 01/03/2016   Stable angina ([name]) 11/29/2015   Benign essential hypertension 05/27/2015   Bilateral carotid artery stenosis 02/24/2015   Atherosclerotic peripheral vascular disease ([name]) 09/07/2014   Bladder calculus 08/12/2014  Primary osteoarthritis of left knee 03/10/2014   Elevated prostate specific antigen (PSA) 02/05/2013   Snoring 01/22/2013   Sleepiness 01/22/2013   Dysphagia 01/22/2013   Occlusion and stenosis of carotid artery without mention of cerebral infarction 01/22/2013   Cerebral thrombosis with cerebral infarction ([name]) 01/22/2013   Benign prostatic hyperplasia with incomplete bladder emptying 08/26/2012   Chronic prostatitis 08/26/2012   Family history of malignant neoplasm of prostate 08/26/2012   Incomplete emptying of bladder 08/26/2012   Dysphagia 05/14/2012   Global aphasia 05/14/2012   Stroke, acute, thrombotic ([name]) 05/12/2012   Symptomatic carotid artery stenosis 05/12/2012   HTN (hypertension) 05/12/2012   COPD (chronic obstructive  pulmonary disease) ([name]) 05/12/2012   Acute respiratory failure ([name]) 05/12/2012    FROM INITIAL EVALUATION PCP: Myrene Buddy, NP  REFERRING PROVIDER: Myrene Buddy, *   REFERRING DIAG: I63.10 (ICD-10-CM) - Cerebral infarction due to embolism of unspecified precerebral artery S06.9XAA (ICD-10-CM) - Unspecified intracranial injury with loss of consciousness status unknown, initial encounter R47.01 (ICD-10-CM) - Aphasia S01.90XA (ICD-10-CM) - Unspecified open wound of unspecified part of head, initial encounter  RATIONALE FOR EVALUATION AND TREATMENT: Rehabilitation  THERAPY DIAG: Muscle weakness (generalized)  Unsteadiness on feet  ONSET DATE: 05/12/23  FOLLOW-UP APPT SCHEDULED WITH REFERRING PROVIDER: Yes    SUBJECTIVE:                                                                                                                                                                                         SUBJECTIVE STATEMENT:   Severe imbalance with R sided weakness;   PERTINENT HISTORY:  Pt referred to PT for unsteadiness s/p CVA. He was hospitalized at [facility] on 05/12/23-05/15/23 for chills, weakness, and left-sided testicular pain for several days. Noted baseline history of CVA with right-sided deficits. This was acutely worsened w/ his other sx. Urinalysis mildly indicative of infection. Troponin 19-28. COVID flu and RSV negative. Lactate within normal limits. CT head- negative acute intracranial abnormality. Old left MCA infarct with left frontal encephalomalacia (R sided weakness). Atrophy, chronic microvascular disease. Brain MRI read as punctate acute or subacute infarct in the right superior cerebellum. No associated hemorrhage or mass effect. Scrotal ultrasound showed bilateral hydroceles, simple on the right and complex (multiseptated) left, bilateral varicoceles, epididymides are enlarged and heterogeneous in appearance bilaterally, with multiple epididymal cysts  and/or spermatoceles, and multiple calcifications. No evidence of testicular torsion, or findings suggestive of epididymo-orchitis.Initially treated with IV rocephin, flagyl, vancomycin as well as topical miconazole cream. Neurology and urology were consulted and was admitted for further wrkp/management. Neurology did not recommend any further neurological workup and he was asymptomatic. Additionally,  patient was found to have significantly elevated blood pressures throughout his stay and was not on home therapy. He was started on entresto given his CAD and HFpEF following permissive hypertensive period however he was taken off after his recent cardiology visit. Pt has had 3 falls since returning home from the hospital. He had a notable worsening of his balance with recent CVA. Recently finished speech therapy.   Pain: No Numbness/Tingling: No Focal Weakness: Yes, RUE/RLE weakness, mild R facial weakness Recent changes in overall health/medication: Yes Prior history of physical therapy for balance:  Yes, 2013 and L MCA infarct Dominant hand: right Imaging: Yes  Red flags: Negative for bowel/bladder changes, personal history of cancer, abdominal pain, chills/fever, night sweats, nausea, vomiting, unrelenting pain,  PRECAUTIONS: None  WEIGHT BEARING RESTRICTIONS: No  FALLS: Has patient fallen in last 6 months? Yes. 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   Patient Goals: Pt wants to be able to work in the yard, strengthen his R side, and improve his  balance.    OBJECTIVE:   Patient Surveys  FOTO: 50, predicted improvement to 40   Cognition Patient is oriented to person, place, and time.  Recent memory is intact.  Remote memory is intact.  Attention span and concentration are intact.  Expressive aphasia noted; Patient's fund of knowledge is within normal limits for educational level.    Gross Musculoskeletal Assessment Tremor: None Bulk: Normal Tone: Normal  Posture: No gross abnormalities noted in standing or seated posture  AROM No gross deficits noted with functional activities  LE MMT: MMT (out of 5) Right  Left   Hip flexion 4+ 4+  Hip extension    Hip abduction (seated) 5 5  Hip adduction (seated) 5 5  Hip internal rotation 5 5  Hip external rotation 5 5  Knee flexion 5 5  Knee extension 5 5  Ankle dorsiflexion 5 5  Ankle plantarflexion Active Active  Ankle inversion    Ankle eversion    (* = pain; Blank rows = not tested)  Slight decrease in R shoulder flexion and R grip strength noted compared to left side  Sensation Grossly intact to light touch throughout bilateral LEs as determined by testing dermatomes L2-S2. Proprioception, stereognosis, and hot/cold testing deferred on this date.  Reflexes Deferred  Cranial Nerves Visual acuity and visual fields are intact  Extraocular muscles are intact  Facial sensation is intact bilaterally  Facial strength demonstrates slight droop in R corner of mouth  Hearing is normal as tested by gross conversation Palate elevates midline, normal phonation  Shoulder shrug strength is intact  Tongue protrudes midline  Coordination/Cerebellar Finger to Nose: Mildly dysmetric RUE Heel to Shin: Mildly dysmetric RLE Rapid alternating movements: WNL Finger Opposition: More difficult R hand Pronator Drift: Negative  Bed mobility: Deferred  Transfers: Assistive device utilized: None  Sit to stand: Complete Independence Stand to sit: Complete  Independence Chair to chair: Complete Independence Floor:  Not tested  Curb:  Not tested  Stairs: Level of Assistance: Modified independence Stair Negotiation Technique: Step to Pattern with Bilateral  Rails Number of Stairs: 4  Height of Stairs: 6"  Comments: Utilized step-to pattern during descent only;  Gait: Gait pattern: decreased step length- Left and decreased stance time- Right Distance walked: 100 Assistive device utilized: None Level of assistance: Complete Independence Comments: Slight decrease stance time RLE, decreased self-selected speed  Functional Outcome Measures  Results Comments  BERG 49/56   DGI    FGA    TUG 9.5 seconds   5TSTS 11.8 seconds   6 Minute Walk Test    10 Meter Gait Speed    (Blank rows = not tested)   TODAY'S TREATMENT (08/24/23):      SUBJECTIVE: Pt reports that he is doing well today. No significant changes since the last therapy session. No pain reported. No specific questions or concerns.    PAIN: None reported   Neuromuscular Re-education  Tandem balance alternating forward LE x 30s each; Tandem gait in // bars without UE support x multiple lengths;   Ther-ex  NuStep L1-4 x 7 minutes for warm-up and BLE strengthening during interval history, therapist adjusting resistance and monitoring fatigue to ensure proper difficulty (5 minutes unbilled); 6" forward step-ups without UE support x 10 BLE (pt struggles clearing foot up/down step)  Standing hip strengthening with 5# ankle weights (AW): Hip flexion marches x 45s BLE; HS curls x 45s BLE; Hip abduction x 45s BLE; Hip extension x 45s BLE;  Seated LAQ with 5# AW x 45s BLE; Sit to stand with 8# overhead med ball press 2 x 10;   Not performed: Nautilus resisted gait 50# forward, backward, R lateral, and L lateral x 2 each direction; Tandem balance alternating forward LE horizontal and vertical head turns x 30s each Blaze pods with random setting, 5 pods with 4 distracting  colors, 2 cycles, 60s/cycle. Star toe taps from 9 to 3 o'clock, significantly more difficult for patient due to the cognitive challenge; Total Gym (TG) Level 22 (L22) double leg squats x 15; TG L22 single leg squats 2 x 10 BLE;  Cross-over stepping in // bars with faded UE support x 4 lengths; Forward gait in // bars without UE support with 6" lateral cone taps; TRX squats 2 x 15; Forward BOSU lunges x 10 with each foot forward; Blaze pods hand reaching in // bars with random setting, 6 pods, 2 cycles without distracting and 2 cycles with distracting, 60s/cycle (added airex for last 2 cycles) Forward high knee marching in // bars with contralateral hand to knee 2 x 2 lengths (motor planning very difficult for patient); Obstacle course with 6" hurdles and Airex pad step up/down in // bars without UE support x multiple lengths; Airex static balance feet apart eyes open/closed x 30s each; Airex balance feet apart eyes open horizontal and vertical head turns x 30s each; Airex static balance feet together eyes open/closed x 30s each; Airex balance feet together eyes open horizontal and vertical head turns x 30s each; Airex balance feet together eyes open ball passes around body with return pass on opposite side, therapist varying height of return pass x multiple bouts toward each side; Airex; Airex alternating 6" step taps without UE support x 10 BLE; Airex staggerred balance with front foot on 6" step without UE support x 60s BLE;    PATIENT EDUCATION:  Education details: Pt educated throughout session about proper posture and technique with exercises. Improved exercise technique, movement at target joints, use of target muscles after min to mod verbal, visual, tactile cues. Balance exercises Person  educated: Patient Education method: Explanation Education comprehension: verbalized understanding   HOME EXERCISE PROGRAM:  Access Code: [email or web address] URL: [email or web address] Date:  07/03/2023 Prepared by: Ria Comment  Exercises - Seated March with Resistance  - 1 x daily - 7 x weekly - 3 sets - 30s hold - Seated Hip Adduction Isometrics with Ball  - 1 x daily - 7 x weekly - 3 sets - 30s hold - Seated Hip Abduction with Resistance  - 1 x daily - 7 x weekly - 3 sets - 30s hold - Seated Long Arc Quad  - 1 x daily - 7 x weekly - 3 sets - 30s hold - Mini Squat with Counter Support  - 1 x daily - 7 x weekly - 3 sets - 30s hold - Heel Raises with Counter Support  - 1 x daily - 7 x weekly - 3 sets - 30s hold   ASSESSMENT:  CLINICAL IMPRESSION:   Pt demonstrates excellent motivation during session today. Progressed balance exercises but major focus on strengthening exercises during session. No HEP updates today. Plan to progress strengthening and balance exercises Patient will continue to benefit from skilled therapy to address remaining deficits in order to improve quality of life and return to prior level of function.  OBJECTIVE IMPAIRMENTS: Abnormal gait, decreased balance, decreased coordination, difficulty walking, and decreased strength.   ACTIVITY LIMITATIONS: carrying, lifting, bending, squatting, stairs, and transfers  PARTICIPATION LIMITATIONS: meal prep, cleaning, shopping, community activity, and yard work  PERSONAL FACTORS: Age and 3+ comorbidities: aphasia, CAD, carotid stenosis, prior CVA, CHF, and COPD  are also affecting patient's functional outcome.   REHAB POTENTIAL: Good  CLINICAL DECISION MAKING: Unstable/unpredictable  EVALUATION COMPLEXITY: High   GOALS: Goals reviewed with patient? No  SHORT TERM GOALS: Target date: 08/10/2023  Pt will be independent with HEP in order to improve strength and balance in order to decrease fall risk and improve function at home. Baseline:  Goal status: INITIAL   LONG TERM GOALS: Target date: 08/16/2023  Pt will increase FOTO to at least 62 to demonstrate significant improvement in function at home related  to balance  Baseline: 50; 07/31/23: 46 Goal status: ONGOING  2.  Pt will improve BERG by at least 3 points in order to demonstrate clinically significant improvement in balance.   Baseline: 06/21/23: 49/56, 07/31/23: 51/56;  Goal status: PARTIALLY MET  3. Pt will improve DGI by at least 3 points in order to demonstrate clinically significant improvement in balance and decreased risk for falls.     Baseline: 14/24, 07/31/23: 17/24; Goal status: ACHIEVED  4. Pt will increase by at least 40m (181ft) in order to demonstrate clinically significant improvement in cardiopulmonary endurance and community ambulation   Baseline: 22' without assistive device. Occasional R foot shuffling, 07/31/23: 940' without assistive device, occasional R foot shuffling; Goal status: PARTIALLY MET   PLAN: PT FREQUENCY: 2x/week  PT DURATION: 8 weeks  PLANNED INTERVENTIONS: Therapeutic exercises, Therapeutic activity, Neuromuscular re-education, Balance training, Gait training, Patient/Family education, Self Care, Joint mobilization, Joint manipulation, Vestibular training, Canalith repositioning, Orthotic/Fit training, DME instructions, Dry Needling, Electrical stimulation, Spinal manipulation, Spinal mobilization, Cryotherapy, Moist heat, Taping, Traction, Ultrasound, Ionotophoresis 4mg /ml Dexamethasone, Manual therapy, and Re-evaluation.  PLAN FOR NEXT SESSION: Progress balance and strengthening exercises, modify/review HEP (add balance exercises) as necessary;   Lynnea Maizes PT, DPT, GCS  Annelle Behrendt, PT, DPT  08/24/2023, 10:36 AM

## 2023-08-28 ENCOUNTER — Ambulatory Visit: Payer: Medicare HMO

## 2023-08-28 DIAGNOSIS — M6281 Muscle weakness (generalized): Secondary | ICD-10-CM

## 2023-08-28 DIAGNOSIS — R2681 Unsteadiness on feet: Secondary | ICD-10-CM

## 2023-08-28 NOTE — Therapy (Signed)
OUTPATIENT PHYSICAL THERAPY BALANCE TREATMENT   Patient Name: Paul Bass MRN: [id number] DOB:[birth year], 78 y.o., male Today's Date: 08/28/2023  END OF SESSION:  PT End of Session - 08/28/23 0805     Visit Number 16    Number of Visits 33    Date for PT Re-Evaluation 10/10/23    Authorization Type eval: 06/21/23, recert: 08/15/23    PT Start Time 0805    PT Stop Time 0850    PT Time Calculation (min) 45 min    Equipment Utilized During Treatment Gait belt    Activity Tolerance Patient tolerated treatment well            Past Medical History:  Diagnosis Date   Accelerated junctional rhythm    Anemia    Anxiety    a.) Tx'd with BZO PRN   Arthritis    Bilateral carotid artery disease ([name])    BPH (benign prostatic hypertrophy)    CAD S/P percutaneous coronary angioplasty    a.) PCI in 2002 placing a RCA stent (unknown type). b.) LHC 12/07/2015: 5% ISR m-dRCA, 90% dRCA, 25% pRCA, 25% p-mLCx, 100% OM3, 95% oOM2-OM2, 75% m-dLAD; refer to CVTS. c.) 3v CABG 01/03/2016   CHF (congestive heart failure) ([name])    a.) TTE 05/12/2012: EF 50-55%; mild LA enlargement; G1DD. b.)  TTE 11/26/2015: EF 45%; mild BAE; triv PR, mild MR/TR; inferolateral HK; G1DD. c.)  TTE 11/07/2017: EF 35%; RV enlargement; BAE; inferior and inferolateral HK; triv PR; mild MR/TR. d.)  TTE 09/17/2019: EF 35%; mild LVH; triv MR/TR. e.)  TTE 08/11/2021: EF 50%; triv MR/TR; G1DD.   Cognitive deficit following cerebrovascular accident (CVA)    COPD (chronic obstructive pulmonary disease) ([name])    Cortical cataract    Depression    Dyspnea    GERD (gastroesophageal reflux disease)    History of 2019 novel coronavirus disease (COVID-19) 01/19/2021   History of acute inferior wall MI 2002   a.) PCI was performed placing a RCA stent (unknown type)   History of kidney stones    HTN (hypertension)    Hyperlipidemia    Hypothyroidism    Long term current use of antithrombotics/antiplatelets    a.) DAPT therapy  (ASA + clopidogrel)   OSA (obstructive sleep apnea)    a.) does not require nocturnal PAP therapy   PLMD (periodic limb movement disorder)    Postoperative atrial fibrillation ([name]) 01/03/2016   a.) following CABG procedure   Prostatitis    PVD (peripheral vascular disease) ([name])    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 b/) residual RIGHT sided weakness   T2DM (type 2 diabetes mellitus) ([name])    Tobacco abuse    Past Surgical History:  Procedure Laterality Date   CARDIAC CATHETERIZATION N/A 12/07/2015   Procedure: Left Heart Cath and Coronary Angiography;  Surgeon: Lamar Blinks, MD;  Location: [facility]IVE [facility];  Service: Cardiovascular;  Laterality: N/A;   CAROTID ANGIOGRAPHY N/A 06/05/2022   Procedure: CAROTID ANGIOGRAPHY;  Surgeon: Annice Needy, MD;  Location: [facility];  Service: Cardiovascular;  Laterality: N/A;   CAROTID PTA/STENT INTERVENTION Left    HX: 2 stents   CATARACT EXTRACTION W/PHACO Left 03/24/2021   Procedure: CATARACT EXTRACTION PHACO AND INTRAOCULAR  LENS PLACEMENT (IOC) LEFT DIABETIC 6.67 00:42.5;  Surgeon: Galen Manila, MD;  Location: [facility] OR[facility];  Service: Ophthalmology;  Laterality: Left;   COLONOSCOPY WITH PROPOFOL N/A 02/18/2019   Procedure: COLONOSCOPY WITH PROPOFOL;  Surgeon: Midge Minium, MD;  Location: [facility] ENDOSCOPY;  Service: Endoscopy;  Laterality: N/A;   COLONOSCOPY WITH PROPOFOL N/A 12/22/2021   Procedure: COLONOSCOPY WITH PROPOFOL;  Surgeon: Midge Minium, MD;  Location: [facility] ENDOSCOPY;  Service: Endoscopy;  Laterality: N/A;   CORONARY ANGIOPLASTY WITH STENT PLACEMENT Left 2002   CORONARY ARTERY BYPASS GRAFT N/A 01/03/2016   Procedure: 3v CORONARY ARTERY BYPASS GRAFT (LIMA-LAD, SVG-PDA, SVG-OM2);  Location: [facility]; Surgeon: Cain Sieve, MD   CYSTOSCOPY W/ URETERAL STENT REMOVAL N/A 10/04/2021   Procedure: CYSTOSCOPY WITH PROSTATE FOREIGN BODY REMOVAL;  Surgeon: Riki Altes, MD;  Location: [facility] ORS;  Service: Urology;  Laterality: N/A;   CYSTOSCOPY WITH INSERTION OF UROLIFT N/A 04/29/2019   Procedure: CYSTOSCOPY WITH INSERTION OF UROLIFT;  Surgeon: Riki Altes, MD;  Location: [facility] ORS;  Service: Urology;  Laterality: N/A;   ESOPHAGOGASTRODUODENOSCOPY (EGD) WITH PROPOFOL N/A 09/11/2016   Procedure: ESOPHAGOGASTRODUODENOSCOPY (EGD) WITH PROPOFOL;  Surgeon: Midge Minium, MD;  Location: [facility] ENDOSCOPY;  Service: Endoscopy;  Laterality: N/A;   ESOPHAGOGASTRODUODENOSCOPY (EGD) WITH PROPOFOL N/A 10/17/2016   Procedure: ESOPHAGOGASTRODUODENOSCOPY (EGD) WITH PROPOFOL;  Surgeon: Midge Minium, MD;  Location: [facility] ENDOSCOPY;  Service: Endoscopy;  Laterality: N/A;   ESOPHAGOGASTRODUODENOSCOPY (EGD) WITH PROPOFOL N/A 11/13/2017   Procedure: ESOPHAGOGASTRODUODENOSCOPY (EGD) WITH PROPOFOL;  Surgeon: Midge Minium, MD;  Location: [facility] ENDOSCOPY;  Service: Endoscopy;  Laterality: N/A;   pins R lower leg Left    rotator cuff replaced Right    SAVORY DILATION N/A 10/17/2016   Procedure: SAVORY DILATION;  Surgeon: Midge Minium, MD;  Location: [facility] ENDOSCOPY;  Service: Endoscopy;  Laterality: N/A;   TRANSURETHRAL INCISION OF PROSTATE N/A 10/04/2021   Procedure: TRANSURETHRAL INCISION OF THE PROSTATE (TUIP);  Surgeon: Riki Altes, MD;  Location: [facility] ORS;  Service: Urology;  Laterality: N/A;   Patient Active Problem List   Diagnosis Date Noted   Acute cystitis without hematuria 05/15/2023   Pseudomonas infection 05/15/2023   Pseudomonas urinary tract infection 05/14/2023   Left testicular pain 05/13/2023   Generalized weakness 05/13/2023   Cellulitis of scrotum 05/13/2023   Cerebellar stroke, acute ([name]) 05/13/2023   Cellulitis 05/12/2023   Cellulitis of groin 05/12/2023   Carotid stenosis,  symptomatic, with infarction ([name]) 06/05/2022   History of colonic polyps    Anxiety state 11/17/2021   Cognitive deficit, post-stroke 11/17/2021   Ex-smoker 11/17/2021   Bradycardia 02/03/2021   Chronic systolic CHF (congestive heart failure), NYHA class 2 ([name]) 05/27/2020   Accelerated junctional rhythm 04/21/2019   Encounter for screening colonoscopy    Polyp of colon    CAD (coronary artery disease) 01/14/2019   Depression 01/14/2019   GERD (gastroesophageal reflux disease) 01/14/2019   Periodic limb movement disorder (PLMD) 01/14/2019   Sleep apnea 01/14/2019   Type 2 diabetes mellitus without complications ([name]) 01/14/2019   Stricture and stenosis of esophagus    Problems with swallowing and mastication    Food impaction of esophagus    H/O coronary artery bypass surgery 01/17/2016   Cerebrovascular accident (CVA) due to embolism ([name]) 01/03/2016   Hypothyroidism, unspecified 01/03/2016   HLD (hyperlipidemia) 01/03/2016   Stable angina ([name]) 11/29/2015   Benign essential hypertension 05/27/2015   Bilateral carotid artery stenosis 02/24/2015   Atherosclerotic peripheral vascular disease ([name]) 09/07/2014   Bladder calculus 08/12/2014  Primary osteoarthritis of left knee 03/10/2014   Elevated prostate specific antigen (PSA) 02/05/2013   Snoring 01/22/2013   Sleepiness 01/22/2013   Dysphagia 01/22/2013   Occlusion and stenosis of carotid artery without mention of cerebral infarction 01/22/2013   Cerebral thrombosis with cerebral infarction ([name]) 01/22/2013   Benign prostatic hyperplasia with incomplete bladder emptying 08/26/2012   Chronic prostatitis 08/26/2012   Family history of malignant neoplasm of prostate 08/26/2012   Incomplete emptying of bladder 08/26/2012   Dysphagia 05/14/2012   Global aphasia 05/14/2012   Stroke, acute, thrombotic ([name]) 05/12/2012   Symptomatic carotid artery stenosis 05/12/2012   HTN (hypertension) 05/12/2012   COPD (chronic obstructive  pulmonary disease) ([name]) 05/12/2012   Acute respiratory failure ([name]) 05/12/2012    FROM INITIAL EVALUATION PCP: Myrene Buddy, NP  REFERRING PROVIDER: Myrene Buddy, *   REFERRING DIAG: I63.10 (ICD-10-CM) - Cerebral infarction due to embolism of unspecified precerebral artery S06.9XAA (ICD-10-CM) - Unspecified intracranial injury with loss of consciousness status unknown, initial encounter R47.01 (ICD-10-CM) - Aphasia S01.90XA (ICD-10-CM) - Unspecified open wound of unspecified part of head, initial encounter  RATIONALE FOR EVALUATION AND TREATMENT: Rehabilitation  THERAPY DIAG: Unsteadiness on feet  Muscle weakness (generalized)  ONSET DATE: 05/12/23  FOLLOW-UP APPT SCHEDULED WITH REFERRING PROVIDER: Yes    SUBJECTIVE:                                                                                                                                                                                         SUBJECTIVE STATEMENT:   Severe imbalance with R sided weakness;   PERTINENT HISTORY:  Pt referred to PT for unsteadiness s/p CVA. He was hospitalized at [facility] on 05/12/23-05/15/23 for chills, weakness, and left-sided testicular pain for several days. Noted baseline history of CVA with right-sided deficits. This was acutely worsened w/ his other sx. Urinalysis mildly indicative of infection. Troponin 19-28. COVID flu and RSV negative. Lactate within normal limits. CT head- negative acute intracranial abnormality. Old left MCA infarct with left frontal encephalomalacia (R sided weakness). Atrophy, chronic microvascular disease. Brain MRI read as punctate acute or subacute infarct in the right superior cerebellum. No associated hemorrhage or mass effect. Scrotal ultrasound showed bilateral hydroceles, simple on the right and complex (multiseptated) left, bilateral varicoceles, epididymides are enlarged and heterogeneous in appearance bilaterally, with multiple epididymal cysts  and/or spermatoceles, and multiple calcifications. No evidence of testicular torsion, or findings suggestive of epididymo-orchitis.Initially treated with IV rocephin, flagyl, vancomycin as well as topical miconazole cream. Neurology and urology were consulted and was admitted for further wrkp/management. Neurology did not recommend any further neurological workup and he was asymptomatic. Additionally,  patient was found to have significantly elevated blood pressures throughout his stay and was not on home therapy. He was started on entresto given his CAD and HFpEF following permissive hypertensive period however he was taken off after his recent cardiology visit. Pt has had 3 falls since returning home from the hospital. He had a notable worsening of his balance with recent CVA. Recently finished speech therapy.   Pain: No Numbness/Tingling: No Focal Weakness: Yes, RUE/RLE weakness, mild R facial weakness Recent changes in overall health/medication: Yes Prior history of physical therapy for balance:  Yes, 2013 and L MCA infarct Dominant hand: right Imaging: Yes  Red flags: Negative for bowel/bladder changes, personal history of cancer, abdominal pain, chills/fever, night sweats, nausea, vomiting, unrelenting pain,  PRECAUTIONS: None  WEIGHT BEARING RESTRICTIONS: No  FALLS: Has patient fallen in last 6 months? Yes. 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   Patient Goals: Pt wants to be able to work in the yard, strengthen his R side, and improve his  balance.    OBJECTIVE:   Patient Surveys  FOTO: 50, predicted improvement to 65   Cognition Patient is oriented to person, place, and time.  Recent memory is intact.  Remote memory is intact.  Attention span and concentration are intact.  Expressive aphasia noted; Patient's fund of knowledge is within normal limits for educational level.    Gross Musculoskeletal Assessment Tremor: None Bulk: Normal Tone: Normal  Posture: No gross abnormalities noted in standing or seated posture  AROM No gross deficits noted with functional activities  LE MMT: MMT (out of 5) Right  Left   Hip flexion 4+ 4+  Hip extension    Hip abduction (seated) 5 5  Hip adduction (seated) 5 5  Hip internal rotation 5 5  Hip external rotation 5 5  Knee flexion 5 5  Knee extension 5 5  Ankle dorsiflexion 5 5  Ankle plantarflexion Active Active  Ankle inversion    Ankle eversion    (* = pain; Blank rows = not tested)  Slight decrease in R shoulder flexion and R grip strength noted compared to left side  Sensation Grossly intact to light touch throughout bilateral LEs as determined by testing dermatomes L2-S2. Proprioception, stereognosis, and hot/cold testing deferred on this date.  Reflexes Deferred  Cranial Nerves Visual acuity and visual fields are intact  Extraocular muscles are intact  Facial sensation is intact bilaterally  Facial strength demonstrates slight droop in R corner of mouth  Hearing is normal as tested by gross conversation Palate elevates midline, normal phonation  Shoulder shrug strength is intact  Tongue protrudes midline  Coordination/Cerebellar Finger to Nose: Mildly dysmetric RUE Heel to Shin: Mildly dysmetric RLE Rapid alternating movements: WNL Finger Opposition: More difficult R hand Pronator Drift: Negative  Bed mobility: Deferred  Transfers: Assistive device utilized: None  Sit to stand: Complete Independence Stand to sit: Complete  Independence Chair to chair: Complete Independence Floor:  Not tested  Curb:  Not tested  Stairs: Level of Assistance: Modified independence Stair Negotiation Technique: Step to Pattern with Bilateral  Rails Number of Stairs: 4  Height of Stairs: 6"  Comments: Utilized step-to pattern during descent only;  Gait: Gait pattern: decreased step length- Left and decreased stance time- Right Distance walked: 100 Assistive device utilized: None Level of assistance: Complete Independence Comments: Slight decrease stance time RLE, decreased self-selected speed  Functional Outcome Measures  Results Comments  BERG 49/56   DGI    FGA    TUG 9.5 seconds   5TSTS 11.8 seconds   6 Minute Walk Test    10 Meter Gait Speed    (Blank rows = not tested)   TODAY'S TREATMENT (08/28/23):    SUBJECTIVE: Pt reports that he is doing well today, but has felt a little unbalance this morning. No significant changes since the last therapy session. No pain reported. No specific questions or concerns.    PAIN: None reported   Neuromuscular Re-education  Blaze pods hand reaching in // bars with focus setting, 6 pods, 5 distracting, 3 x 60s/cycle with lateral walks on airex beam; 6" forward step-ups without UE support x 10 BLE (pt struggles clearing foot up/down step); Alternating 6" step taps without UE support x 15 BLE; Tandem gait in // bars to pick up cones and place back on ground x multiple bouts; Airex FT ball toss with therapist 2 x 60s;    Ther-ex  NuStep L1-4 x 7 minutes for warm-up and BLE strengthening during interval history, therapist adjusting resistance and monitoring fatigue to ensure proper difficulty (5 minutes unbilled);  Standing hip strengthening with 5# ankle weights (AW): Hip flexion marches x 45s BLE; HS curls x 45s BLE; Hip abduction x 45s BLE; Hip extension x 45s BLE;  Seated LAQ with 5# AW x 45s BLE; Sit to stand with 8# overhead med ball press 2 x 10;   Not  performed: Tandem balance alternating forward LE x 30s each; Nautilus resisted gait 50# forward, backward, R lateral, and L lateral x 2 each direction; Tandem balance alternating forward LE horizontal and vertical head turns x 30s each Blaze pods with random setting, 5 pods with 4 distracting colors, 2 cycles, 60s/cycle. Star toe taps from 9 to 3 o'clock, significantly more difficult for patient due to the cognitive challenge; Total Gym (TG) Level 22 (L22) double leg squats x 15; TG L22 single leg squats 2 x 10 BLE;  Cross-over stepping in // bars with faded UE support x 4 lengths; Forward gait in // bars without UE support with 6" lateral cone taps; TRX squats 2 x 15; Forward BOSU lunges x 10 with each foot forward; Forward high knee marching in // bars with contralateral hand to knee 2 x 2 lengths (motor planning very difficult for patient); Obstacle course with 6" hurdles and Airex pad step up/down in // bars without UE support x multiple lengths; Airex static balance feet apart eyes open/closed x 30s each; Airex balance feet apart eyes open horizontal and vertical head turns x 30s each; Airex static balance feet together eyes open/closed x 30s each; Airex balance feet together eyes open horizontal and vertical head turns x 30s each; Airex balance feet together eyes open ball passes around body with return pass on opposite side, therapist varying height of return pass x multiple bouts toward each side; Airex alternating 6" step taps without UE support x 10 BLE; Airex staggerred balance with front foot on 6" step without UE support x 60s BLE;    PATIENT EDUCATION:  Education details: Pt educated throughout session about proper posture  and technique with exercises. Improved exercise technique, movement at target joints, use of target muscles after min to mod verbal, visual, tactile cues. Balance exercises Person educated: Patient Education method: Explanation Education comprehension:  verbalized understanding   HOME EXERCISE PROGRAM:  Access Code: [email or web address] URL: [email or web address] Date: 07/03/2023 Prepared by: Ria Comment  Exercises - Seated March with Resistance  - 1 x daily - 7 x weekly - 3 sets - 30s hold - Seated Hip Adduction Isometrics with Ball  - 1 x daily - 7 x weekly - 3 sets - 30s hold - Seated Hip Abduction with Resistance  - 1 x daily - 7 x weekly - 3 sets - 30s hold - Seated Long Arc Quad  - 1 x daily - 7 x weekly - 3 sets - 30s hold - Mini Squat with Counter Support  - 1 x daily - 7 x weekly - 3 sets - 30s hold - Heel Raises with Counter Support  - 1 x daily - 7 x weekly - 3 sets - 30s hold   ASSESSMENT:  CLINICAL IMPRESSION:   Pt demonstrates excellent motivation during session today. Pt fatigued quickly and required frequent rest breaks. No HEP updates today. Plan to progress strengthening and balance exercises Patient will continue to benefit from skilled therapy to address remaining deficits in order to improve quality of life and return to prior level of function.  OBJECTIVE IMPAIRMENTS: Abnormal gait, decreased balance, decreased coordination, difficulty walking, and decreased strength.   ACTIVITY LIMITATIONS: carrying, lifting, bending, squatting, stairs, and transfers  PARTICIPATION LIMITATIONS: meal prep, cleaning, shopping, community activity, and yard work  PERSONAL FACTORS: Age and 3+ comorbidities: aphasia, CAD, carotid stenosis, prior CVA, CHF, and COPD  are also affecting patient's functional outcome.   REHAB POTENTIAL: Good  CLINICAL DECISION MAKING: Unstable/unpredictable  EVALUATION COMPLEXITY: High   GOALS: Goals reviewed with patient? No  SHORT TERM GOALS: Target date: 08/10/2023  Pt will be independent with HEP in order to improve strength and balance in order to decrease fall risk and improve function at home. Baseline:  Goal status: INITIAL   LONG TERM GOALS: Target date: 08/16/2023  Pt will  increase FOTO to at least 62 to demonstrate significant improvement in function at home related to balance  Baseline: 50; 07/31/23: 46 Goal status: ONGOING  2.  Pt will improve BERG by at least 3 points in order to demonstrate clinically significant improvement in balance.   Baseline: 06/21/23: 49/56, 07/31/23: 51/56;  Goal status: PARTIALLY MET  3. Pt will improve DGI by at least 3 points in order to demonstrate clinically significant improvement in balance and decreased risk for falls.     Baseline: 14/24, 07/31/23: 17/24; Goal status: ACHIEVED  4. Pt will increase by at least 84m (142ft) in order to demonstrate clinically significant improvement in cardiopulmonary endurance and community ambulation   Baseline: 85' without assistive device. Occasional R foot shuffling, 07/31/23: 940' without assistive device, occasional R foot shuffling; Goal status: PARTIALLY MET   PLAN: PT FREQUENCY: 2x/week  PT DURATION: 8 weeks  PLANNED INTERVENTIONS: Therapeutic exercises, Therapeutic activity, Neuromuscular re-education, Balance training, Gait training, Patient/Family education, Self Care, Joint mobilization, Joint manipulation, Vestibular training, Canalith repositioning, Orthotic/Fit training, DME instructions, Dry Needling, Electrical stimulation, Spinal manipulation, Spinal mobilization, Cryotherapy, Moist heat, Taping, Traction, Ultrasound, Ionotophoresis 4mg /ml Dexamethasone, Manual therapy, and Re-evaluation.  PLAN FOR NEXT SESSION: Progress balance and strengthening exercises, modify/review HEP (add balance exercises) as necessary;  Sherri Sear, SPT [employer]  Lynnea Maizes PT, DPT, GCS  Huprich,Jason, PT, DPT  08/28/2023, 3:34 PM

## 2023-08-30 ENCOUNTER — Ambulatory Visit: Payer: Medicare HMO

## 2023-08-30 DIAGNOSIS — R2681 Unsteadiness on feet: Secondary | ICD-10-CM | POA: Diagnosis not present

## 2023-08-30 DIAGNOSIS — M6281 Muscle weakness (generalized): Secondary | ICD-10-CM | POA: Diagnosis not present

## 2023-08-30 NOTE — Therapy (Signed)
OUTPATIENT PHYSICAL THERAPY BALANCE TREATMENT   Patient Name: BROLY HATFIELD MRN: [id number] DOB:06-Jan-1946, 78 y.o., male Today's Date: 08/30/2023  END OF SESSION:  PT End of Session - 08/30/23 0800     Visit Number 17    Number of Visits 33    Date for PT Re-Evaluation 10/10/23    PT Start Time 0800    PT Stop Time 0845    PT Time Calculation (min) 45 min    Equipment Utilized During Treatment Gait belt    Activity Tolerance Patient tolerated treatment well    Behavior During Therapy WFL for tasks assessed/performed            Past Medical History:  Diagnosis Date   Accelerated junctional rhythm    Anemia    Anxiety    a.) Tx'd with BZO PRN   Arthritis    Bilateral carotid artery disease ([name])    BPH (benign prostatic hypertrophy)    CAD S/P percutaneous coronary angioplasty    a.) PCI in 2002 placing a RCA stent (unknown type). b.) LHC 12/07/2015: 5% ISR m-dRCA, 90% dRCA, 25% pRCA, 25% p-mLCx, 100% OM3, 95% oOM2-OM2, 75% m-dLAD; refer to CVTS. c.) 3v CABG 01/03/2016   CHF (congestive heart failure) ([name])    a.) TTE 05/12/2012: EF 50-55%; mild LA enlargement; G1DD. b.)  TTE 11/26/2015: EF 45%; mild BAE; triv PR, mild MR/TR; inferolateral HK; G1DD. c.)  TTE 11/07/2017: EF 35%; RV enlargement; BAE; inferior and inferolateral HK; triv PR; mild MR/TR. d.)  TTE 09/17/2019: EF 35%; mild LVH; triv MR/TR. e.)  TTE 08/11/2021: EF 50%; triv MR/TR; G1DD.   Cognitive deficit following cerebrovascular accident (CVA)    COPD (chronic obstructive pulmonary disease) ([name])    Cortical cataract    Depression    Dyspnea    GERD (gastroesophageal reflux disease)    History of 2019 novel coronavirus disease (COVID-19) 01/19/2021   History of acute inferior wall MI 2002   a.) PCI was performed placing a RCA stent (unknown type)   History of kidney stones    HTN (hypertension)    Hyperlipidemia    Hypothyroidism    Long term current use of antithrombotics/antiplatelets    a.) DAPT  therapy (ASA + clopidogrel)   OSA (obstructive sleep apnea)    a.) does not require nocturnal PAP therapy   PLMD (periodic limb movement disorder)    Postoperative atrial fibrillation ([name]) 01/03/2016   a.) following CABG procedure   Prostatitis    PVD (peripheral vascular disease) ([name])    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 b/) residual RIGHT sided weakness   T2DM (type 2 diabetes mellitus) ([name])    Tobacco abuse    Past Surgical History:  Procedure Laterality Date   CARDIAC CATHETERIZATION N/A 12/07/2015   Procedure: Left Heart Cath and Coronary Angiography;  Surgeon: Lamar Blinks, MD;  Location: [facility];  Service: Cardiovascular;  Laterality: N/A;   CAROTID ANGIOGRAPHY N/A 06/05/2022   Procedure: CAROTID ANGIOGRAPHY;  Surgeon: Annice Needy, MD;  Location: [facility];  Service: Cardiovascular;  Laterality: N/A;   CAROTID PTA/STENT INTERVENTION Left    HX: 2 stents   CATARACT EXTRACTION W/PHACO Left 03/24/2021   Procedure: CATARACT EXTRACTION PHACO AND  INTRAOCULAR LENS PLACEMENT (IOC) LEFT DIABETIC 6.67 00:42.5;  Surgeon: Galen Manila, MD;  Location: [facility];  Service: Ophthalmology;  Laterality: Left;   COLONOSCOPY WITH PROPOFOL N/A 02/18/2019   Procedure: COLONOSCOPY WITH PROPOFOL;  Surgeon: Midge Minium, MD;  Location: [facility] ENDOSCOPY;  Service: Endoscopy;  Laterality: N/A;   COLONOSCOPY WITH PROPOFOL N/A 12/22/2021   Procedure: COLONOSCOPY WITH PROPOFOL;  Surgeon: Midge Minium, MD;  Location: [facility] ENDOSCOPY;  Service: Endoscopy;  Laterality: N/A;   CORONARY ANGIOPLASTY WITH STENT PLACEMENT Left 2002   CORONARY ARTERY BYPASS GRAFT N/A 01/03/2016   Procedure: 3v CORONARY ARTERY BYPASS GRAFT (LIMA-LAD, SVG-PDA,  SVG-OM2); Location: [facility]; Surgeon: Cain Sieve, MD   CYSTOSCOPY W/ URETERAL STENT REMOVAL N/A 10/04/2021   Procedure: CYSTOSCOPY WITH PROSTATE FOREIGN BODY REMOVAL;  Surgeon: Riki Altes, MD;  Location: [facility];  Service: Urology;  Laterality: N/A;   CYSTOSCOPY WITH INSERTION OF UROLIFT N/A 04/29/2019   Procedure: CYSTOSCOPY WITH INSERTION OF UROLIFT;  Surgeon: Riki Altes, MD;  Location: [facility];  Service: Urology;  Laterality: N/A;   ESOPHAGOGASTRODUODENOSCOPY (EGD) WITH PROPOFOL N/A 09/11/2016   Procedure: ESOPHAGOGASTRODUODENOSCOPY (EGD) WITH PROPOFOL;  Surgeon: Midge Minium, MD;  Location: [facility] ENDOSCOPY;  Service: Endoscopy;  Laterality: N/A;   ESOPHAGOGASTRODUODENOSCOPY (EGD) WITH PROPOFOL N/A 10/17/2016   Procedure: ESOPHAGOGASTRODUODENOSCOPY (EGD) WITH PROPOFOL;  Surgeon: Midge Minium, MD;  Location: [facility] ENDOSCOPY;  Service: Endoscopy;  Laterality: N/A;   ESOPHAGOGASTRODUODENOSCOPY (EGD) WITH PROPOFOL N/A 11/13/2017   Procedure: ESOPHAGOGASTRODUODENOSCOPY (EGD) WITH PROPOFOL;  Surgeon: Midge Minium, MD;  Location: [facility] ENDOSCOPY;  Service: Endoscopy;  Laterality: N/A;   pins R lower leg Left    rotator cuff replaced Right    SAVORY DILATION N/A 10/17/2016   Procedure: SAVORY DILATION;  Surgeon: Midge Minium, MD;  Location: [facility] ENDOSCOPY;  Service: Endoscopy;  Laterality: N/A;   TRANSURETHRAL INCISION OF PROSTATE N/A 10/04/2021   Procedure: TRANSURETHRAL INCISION OF THE PROSTATE (TUIP);  Surgeon: Riki Altes, MD;  Location: [facility];  Service: Urology;  Laterality: N/A;   Patient Active Problem List   Diagnosis Date Noted   Acute cystitis without hematuria 05/15/2023   Pseudomonas infection 05/15/2023   Pseudomonas urinary tract infection 05/14/2023   Left testicular pain 05/13/2023   Generalized weakness 05/13/2023   Cellulitis of scrotum 05/13/2023   Cerebellar stroke, acute ([name]) 05/13/2023   Cellulitis 05/12/2023   Cellulitis of groin 05/12/2023   Carotid  stenosis, symptomatic, with infarction ([name]) 06/05/2022   History of colonic polyps    Anxiety state 11/17/2021   Cognitive deficit, post-stroke 11/17/2021   Ex-smoker 11/17/2021   Bradycardia 02/03/2021   Chronic systolic CHF (congestive heart failure), NYHA class 2 ([name]) 05/27/2020   Accelerated junctional rhythm 04/21/2019   Encounter for screening colonoscopy    Polyp of colon    CAD (coronary artery disease) 01/14/2019   Depression 01/14/2019   GERD (gastroesophageal reflux disease) 01/14/2019   Periodic limb movement disorder (PLMD) 01/14/2019   Sleep apnea 01/14/2019   Type 2 diabetes mellitus without complications ([name]) 01/14/2019   Stricture and stenosis of esophagus    Problems with swallowing and mastication    Food impaction of esophagus    H/O coronary artery bypass surgery 01/17/2016   Cerebrovascular accident (CVA) due to embolism ([name]) 01/03/2016   Hypothyroidism, unspecified 01/03/2016   HLD (hyperlipidemia) 01/03/2016   Stable angina ([name]) 11/29/2015   Benign essential hypertension 05/27/2015   Bilateral carotid artery stenosis 02/24/2015   Atherosclerotic peripheral vascular disease ([name]) 09/07/2014   Bladder calculus  08/12/2014   Primary osteoarthritis of left knee 03/10/2014   Elevated prostate specific antigen (PSA) 02/05/2013   Snoring 01/22/2013   Sleepiness 01/22/2013   Dysphagia 01/22/2013   Occlusion and stenosis of carotid artery without mention of cerebral infarction 01/22/2013   Cerebral thrombosis with cerebral infarction ([name]) 01/22/2013   Benign prostatic hyperplasia with incomplete bladder emptying 08/26/2012   Chronic prostatitis 08/26/2012   Family history of malignant neoplasm of prostate 08/26/2012   Incomplete emptying of bladder 08/26/2012   Dysphagia 05/14/2012   Global aphasia 05/14/2012   Stroke, acute, thrombotic ([name]) 05/12/2012   Symptomatic carotid artery stenosis 05/12/2012   HTN (hypertension) 05/12/2012   COPD (chronic  obstructive pulmonary disease) ([name]) 05/12/2012   Acute respiratory failure ([name]) 05/12/2012    FROM INITIAL EVALUATION PCP: Myrene Buddy, NP  REFERRING PROVIDER: Myrene Buddy, *   REFERRING DIAG: I63.10 (ICD-10-CM) - Cerebral infarction due to embolism of unspecified precerebral artery S06.9XAA (ICD-10-CM) - Unspecified intracranial injury with loss of consciousness status unknown, initial encounter R47.01 (ICD-10-CM) - Aphasia S01.90XA (ICD-10-CM) - Unspecified open wound of unspecified part of head, initial encounter  RATIONALE FOR EVALUATION AND TREATMENT: Rehabilitation  THERAPY DIAG: Unsteadiness on feet  Muscle weakness (generalized)  ONSET DATE: 05/12/23  FOLLOW-UP APPT SCHEDULED WITH REFERRING PROVIDER: Yes    SUBJECTIVE:                                                                                                                                                                                         SUBJECTIVE STATEMENT:   Severe imbalance with R sided weakness;   PERTINENT HISTORY:  Pt referred to PT for unsteadiness s/p CVA. He was hospitalized at [facility] on 05/12/23-05/15/23 for chills, weakness, and left-sided testicular pain for several days. Noted baseline history of CVA with right-sided deficits. This was acutely worsened w/ his other sx. Urinalysis mildly indicative of infection. Troponin 19-28. COVID flu and RSV negative. Lactate within normal limits. CT head- negative acute intracranial abnormality. Old left MCA infarct with left frontal encephalomalacia (R sided weakness). Atrophy, chronic microvascular disease. Brain MRI read as punctate acute or subacute infarct in the right superior cerebellum. No associated hemorrhage or mass effect. Scrotal ultrasound showed bilateral hydroceles, simple on the right and complex (multiseptated) left, bilateral varicoceles, epididymides are enlarged and heterogeneous in appearance bilaterally, with multiple  epididymal cysts and/or spermatoceles, and multiple calcifications. No evidence of testicular torsion, or findings suggestive of epididymo-orchitis.Initially treated with IV rocephin, flagyl, vancomycin as well as topical miconazole cream. Neurology and urology were consulted and was admitted for further wrkp/management. Neurology did not recommend any further neurological workup and he  was asymptomatic. Additionally, patient was found to have significantly elevated blood pressures throughout his stay and was not on home therapy. He was started on entresto given his CAD and HFpEF following permissive hypertensive period however he was taken off after his recent cardiology visit. Pt has had 3 falls since returning home from the hospital. He had a notable worsening of his balance with recent CVA. Recently finished speech therapy.   Pain: No Numbness/Tingling: No Focal Weakness: Yes, RUE/RLE weakness, mild R facial weakness Recent changes in overall health/medication: Yes Prior history of physical therapy for balance:  Yes, 2013 and L MCA infarct Dominant hand: right Imaging: Yes  Red flags: Negative for bowel/bladder changes, personal history of cancer, abdominal pain, chills/fever, night sweats, nausea, vomiting, unrelenting pain,  PRECAUTIONS: None  WEIGHT BEARING RESTRICTIONS: No  FALLS: Has patient fallen in last 6 months? Yes. 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   Patient Goals: Pt wants to be able to work in the yard, strengthen his R side,  and improve his balance.    OBJECTIVE:   Patient Surveys  FOTO: 50, predicted improvement to 43   Cognition Patient is oriented to person, place, and time.  Recent memory is intact.  Remote memory is intact.  Attention span and concentration are intact.  Expressive aphasia noted; Patient's fund of knowledge is within normal limits for educational level.    Gross Musculoskeletal Assessment Tremor: None Bulk: Normal Tone: Normal  Posture: No gross abnormalities noted in standing or seated posture  AROM No gross deficits noted with functional activities  LE MMT: MMT (out of 5) Right  Left   Hip flexion 4+ 4+  Hip extension    Hip abduction (seated) 5 5  Hip adduction (seated) 5 5  Hip internal rotation 5 5  Hip external rotation 5 5  Knee flexion 5 5  Knee extension 5 5  Ankle dorsiflexion 5 5  Ankle plantarflexion Active Active  Ankle inversion    Ankle eversion    (* = pain; Blank rows = not tested)  Slight decrease in R shoulder flexion and R grip strength noted compared to left side  Sensation Grossly intact to light touch throughout bilateral LEs as determined by testing dermatomes L2-S2. Proprioception, stereognosis, and hot/cold testing deferred on this date.  Reflexes Deferred  Cranial Nerves Visual acuity and visual fields are intact  Extraocular muscles are intact  Facial sensation is intact bilaterally  Facial strength demonstrates slight droop in R corner of mouth  Hearing is normal as tested by gross conversation Palate elevates midline, normal phonation  Shoulder shrug strength is intact  Tongue protrudes midline  Coordination/Cerebellar Finger to Nose: Mildly dysmetric RUE Heel to Shin: Mildly dysmetric RLE Rapid alternating movements: WNL Finger Opposition: More difficult R hand Pronator Drift: Negative  Bed mobility: Deferred  Transfers: Assistive device utilized: None  Sit to stand: Complete Independence Stand to sit: Complete  Independence Chair to chair: Complete Independence Floor:  Not tested  Curb:  Not tested  Stairs: Level of Assistance: Modified independence Stair Negotiation Technique: Step to  Pattern with Bilateral Rails Number of Stairs: 4  Height of Stairs: 6"  Comments: Utilized step-to pattern during descent only;  Gait: Gait pattern: decreased step length- Left and decreased stance time- Right Distance walked: 100 Assistive device utilized: None Level of assistance: Complete Independence Comments: Slight decrease stance time RLE, decreased self-selected speed  Functional Outcome Measures  Results Comments  BERG 49/56   DGI    FGA    TUG 9.5 seconds   5TSTS 11.8 seconds   6 Minute Walk Test    10 Meter Gait Speed    (Blank rows = not tested)   TODAY'S TREATMENT (08/30/23):    SUBJECTIVE: Pt reports that he is doing well today, but feeling a little tired. No significant changes since the last therapy session. No pain reported. No specific questions or concerns.    PAIN: None reported   Neuromuscular Re-education  Forward gait in hallway with horizontal/vertical head turns x multiple lengths; Backwards gait in hallway x multiple lengths; Tandem balance alternating forward LE x 30s each; Tandem balance alternating forward LE horizontal and vertical head turns x 30s each; 6" forward step-ups without UE support x 10 BLE (pt struggles clearing foot up/down step); Alternating 6" step taps without UE support x 15 BLE; Forward gait in // bars to pick up cones and place back on ground x multiple bouts; Forward high knee marching in hallway with contralateral hand to knee x 2 lengths; Airex balance feet together eyes open horizontal and vertical head turns x 30s each; Staggerred balance with front foot on 6" step without UE support x 60s BLE; Tandem gait in // bars with no UE support x multiple lengths;   Ther-ex  NuStep L1-4 x 8 minutes for warm-up and BLE strengthening during  interval history, therapist adjusting resistance and monitoring fatigue to ensure proper difficulty (5 minutes unbilled);  Standing hip strengthening with 5# ankle weights (AW): Hip flexion marches x 45s BLE; HS curls x 45s BLE; Hip abduction x 45s BLE; Hip extension x 45s BLE;  Seated LAQ with 5# AW x 45s BLE; Sit to stand with 8# overhead med ball press 2 x 10;   Not performed: Airex FT ball toss with therapist 2 x 60s;  Blaze pods hand reaching in // bars with focus setting, 6 pods, 5 distracting, 3 x 60s/cycle with lateral walks on airex beam; Nautilus resisted gait 50# forward, backward, R lateral, and L lateral x 2 each direction; Blaze pods with random setting, 5 pods with 4 distracting colors, 2 cycles, 60s/cycle. Star toe taps from 9 to 3 o'clock, significantly more difficult for patient due to the cognitive challenge; Total Gym (TG) Level 22 (L22) double leg squats x 15; TG L22 single leg squats 2 x 10 BLE;  Cross-over stepping in // bars with faded UE support x 4 lengths; Forward gait in // bars without UE support with 6" lateral cone taps; TRX squats 2 x 15; Forward BOSU lunges x 10 with each foot forward; Obstacle course with 6" hurdles and Airex pad step up/down in // bars without UE support x multiple lengths; Airex static balance feet apart eyes open/closed x 30s each; Airex balance feet apart eyes open horizontal and vertical head turns x 30s each; Airex static balance feet together eyes open/closed x 30s each; Airex balance feet together eyes open ball passes around body with return pass on opposite side, therapist varying height of return pass x multiple bouts toward each side; Airex alternating 6" step taps  without UE support x 10 BLE;   PATIENT EDUCATION:  Education details: Pt educated throughout session about proper posture and technique with exercises. Improved exercise technique, movement at target joints, use of target muscles after min to mod verbal, visual,  tactile cues. Balance exercises Person educated: Patient Education method: Explanation Education comprehension: verbalized understanding   HOME EXERCISE PROGRAM:  Access Code: [email or web address] URL: [email or web address] Date: 07/03/2023 Prepared by: Ria Comment  Exercises - Seated March with Resistance  - 1 x daily - 7 x weekly - 3 sets - 30s hold - Seated Hip Adduction Isometrics with Ball  - 1 x daily - 7 x weekly - 3 sets - 30s hold - Seated Hip Abduction with Resistance  - 1 x daily - 7 x weekly - 3 sets - 30s hold - Seated Long Arc Quad  - 1 x daily - 7 x weekly - 3 sets - 30s hold - Mini Squat with Counter Support  - 1 x daily - 7 x weekly - 3 sets - 30s hold - Heel Raises with Counter Support  - 1 x daily - 7 x weekly - 3 sets - 30s hold   ASSESSMENT:  CLINICAL IMPRESSION:   Pt demonstrates excellent motivation during session today. Pt fatigued rather quickly and required frequent rest breaks. No HEP updates today. Patient will continue to benefit from skilled therapy to address remaining deficits in order to improve quality of life and return to prior level of function.  OBJECTIVE IMPAIRMENTS: Abnormal gait, decreased balance, decreased coordination, difficulty walking, and decreased strength.   ACTIVITY LIMITATIONS: carrying, lifting, bending, squatting, stairs, and transfers  PARTICIPATION LIMITATIONS: meal prep, cleaning, shopping, community activity, and yard work  PERSONAL FACTORS: Age and 3+ comorbidities: aphasia, CAD, carotid stenosis, prior CVA, CHF, and COPD  are also affecting patient's functional outcome.   REHAB POTENTIAL: Good  CLINICAL DECISION MAKING: Unstable/unpredictable  EVALUATION COMPLEXITY: High   GOALS: Goals reviewed with patient? No  SHORT TERM GOALS: Target date: 08/10/2023  Pt will be independent with HEP in order to improve strength and balance in order to decrease fall risk and improve function at home. Baseline:  Goal  status: INITIAL   LONG TERM GOALS: Target date: 08/16/2023  Pt will increase FOTO to at least 62 to demonstrate significant improvement in function at home related to balance  Baseline: 50; 07/31/23: 46 Goal status: ONGOING  2.  Pt will improve BERG by at least 3 points in order to demonstrate clinically significant improvement in balance.   Baseline: 06/21/23: 49/56, 07/31/23: 51/56;  Goal status: PARTIALLY MET  3. Pt will improve DGI by at least 3 points in order to demonstrate clinically significant improvement in balance and decreased risk for falls.     Baseline: 14/24, 07/31/23: 17/24; Goal status: ACHIEVED  4. Pt will increase by at least 43m (153ft) in order to demonstrate clinically significant improvement in cardiopulmonary endurance and community ambulation   Baseline: 75' without assistive device. Occasional R foot shuffling, 07/31/23: 940' without assistive device, occasional R foot shuffling; Goal status: PARTIALLY MET   PLAN: PT FREQUENCY: 2x/week  PT DURATION: 8 weeks  PLANNED INTERVENTIONS: Therapeutic exercises, Therapeutic activity, Neuromuscular re-education, Balance training, Gait training, Patient/Family education, Self Care, Joint mobilization, Joint manipulation, Vestibular training, Canalith repositioning, Orthotic/Fit training, DME instructions, Dry Needling, Electrical stimulation, Spinal manipulation, Spinal mobilization, Cryotherapy, Moist heat, Taping, Traction, Ultrasound, Ionotophoresis 4mg /ml Dexamethasone, Manual therapy, and Re-evaluation.  PLAN FOR NEXT SESSION: Progress balance and strengthening exercises, modify/review HEP (  add balance exercises) as necessary;  Sherri Sear, SPT [facility]   Jason D Huprich PT, DPT, GCS  Huprich,Jason, PT, DPT  08/30/2023, 11:30 AM

## 2023-09-04 ENCOUNTER — Ambulatory Visit: Payer: Medicare HMO | Attending: Nurse Practitioner

## 2023-09-04 DIAGNOSIS — M6281 Muscle weakness (generalized): Secondary | ICD-10-CM | POA: Insufficient documentation

## 2023-09-04 DIAGNOSIS — R2681 Unsteadiness on feet: Secondary | ICD-10-CM | POA: Diagnosis not present

## 2023-09-04 NOTE — Therapy (Addendum)
 OUTPATIENT PHYSICAL THERAPY BALANCE TREATMENT   Patient Name: Paul Bass MRN: [id number] DOB:01/19/1946, 78 y.o., male Today's Date: 09/05/2023  END OF SESSION:  PT End of Session - 09/04/23 1051     Visit Number 18    Number of Visits 33    Date for PT Re-Evaluation 10/10/23    Authorization Type eval: 06/21/23, recert: 08/15/23    PT Start Time 1100    PT Stop Time 1145    PT Time Calculation (min) 45 min    Equipment Utilized During Treatment Gait belt    Activity Tolerance Patient tolerated treatment well    Behavior During Therapy WFL for tasks assessed/performed            Past Medical History:  Diagnosis Date   Accelerated junctional rhythm    Anemia    Anxiety    a.) Tx'd with BZO PRN   Arthritis    Bilateral carotid artery disease ([name])    BPH (benign prostatic hypertrophy)    CAD S/P percutaneous coronary angioplasty    a.) PCI in 2002 placing a RCA stent (unknown type). b.) LHC 12/07/2015: 5% ISR m-dRCA, 90% dRCA, 25% pRCA, 25% p-mLCx, 100% OM3, 95% oOM2-OM2, 75% m-dLAD; refer to CVTS. c.) 3v CABG 01/03/2016   CHF (congestive heart failure) ([name])    a.) TTE 05/12/2012: EF 50-55%; mild LA enlargement; G1DD. b.)  TTE 11/26/2015: EF 45%; mild BAE; triv PR, mild MR/TR; inferolateral HK; G1DD. c.)  TTE 11/07/2017: EF 35%; RV enlargement; BAE; inferior and inferolateral HK; triv PR; mild MR/TR. d.)  TTE 09/17/2019: EF 35%; mild LVH; triv MR/TR. e.)  TTE 08/11/2021: EF 50%; triv MR/TR; G1DD.   Cognitive deficit following cerebrovascular accident (CVA)    COPD (chronic obstructive pulmonary disease) ([name])    Cortical cataract    Depression    Dyspnea    GERD (gastroesophageal reflux disease)    History of 2019 novel coronavirus disease (COVID-19) 01/19/2021   History of acute inferior wall MI 2002   a.) PCI was performed placing a RCA stent (unknown type)   History of kidney stones    HTN (hypertension)    Hyperlipidemia    Hypothyroidism    Long term current  use of antithrombotics/antiplatelets    a.) DAPT therapy (ASA + clopidogrel )   OSA (obstructive sleep apnea)    a.) does not require nocturnal PAP therapy   PLMD (periodic limb movement disorder)    Postoperative atrial fibrillation ([name]) 01/03/2016   a.) following CABG procedure   Prostatitis    PVD (peripheral vascular disease) ([name])    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 b/) residual RIGHT sided weakness   T2DM (type 2 diabetes mellitus) ([name])    Tobacco abuse    Past Surgical History:  Procedure Laterality Date   CARDIAC CATHETERIZATION N/A 12/07/2015   Procedure: Left Heart Cath and Coronary Angiography;  Surgeon: Wolm JINNY Rhyme, MD;  Location: [facility];  Service: Cardiovascular;  Laterality: N/A;   CAROTID ANGIOGRAPHY N/A 06/05/2022   Procedure: CAROTID ANGIOGRAPHY;  Surgeon: Marea Selinda RAMAN, MD;  Location: [facility];  Service: Cardiovascular;  Laterality: N/A;   CAROTID PTA/STENT INTERVENTION Left    HX: 2 stents   CATARACT EXTRACTION W/PHACO  Left 03/24/2021   Procedure: CATARACT EXTRACTION PHACO AND INTRAOCULAR LENS PLACEMENT (IOC) LEFT DIABETIC 6.67 00:42.5;  Surgeon: Jaye Fallow, MD;  Location: [facility];  Service: Ophthalmology;  Laterality: Left;   COLONOSCOPY WITH PROPOFOL  N/A 02/18/2019   Procedure: COLONOSCOPY WITH PROPOFOL ;  Surgeon: Jinny Carmine, MD;  Location: [facility];  Service: Endoscopy;  Laterality: N/A;   COLONOSCOPY WITH PROPOFOL  N/A 12/22/2021   Procedure: COLONOSCOPY WITH PROPOFOL ;  Surgeon: Jinny Carmine, MD;  Location: [facility];  Service: Endoscopy;  Laterality: N/A;   CORONARY ANGIOPLASTY WITH STENT PLACEMENT Left 2002   CORONARY ARTERY BYPASS GRAFT N/A 01/03/2016   Procedure: 3v  CORONARY ARTERY BYPASS GRAFT (LIMA-LAD, SVG-PDA, SVG-OM2); Location: [facility]; Surgeon: Debby Nova, MD   CYSTOSCOPY W/ URETERAL STENT REMOVAL N/A 10/04/2021   Procedure: CYSTOSCOPY WITH PROSTATE FOREIGN BODY REMOVAL;  Surgeon: Twylla Glendia BROCKS, MD;  Location: [facility];  Service: Urology;  Laterality: N/A;   CYSTOSCOPY WITH INSERTION OF UROLIFT N/A 04/29/2019   Procedure: CYSTOSCOPY WITH INSERTION OF UROLIFT;  Surgeon: Twylla Glendia BROCKS, MD;  Location: [facility];  Service: Urology;  Laterality: N/A;   ESOPHAGOGASTRODUODENOSCOPY (EGD) WITH PROPOFOL  N/A 09/11/2016   Procedure: ESOPHAGOGASTRODUODENOSCOPY (EGD) WITH PROPOFOL ;  Surgeon: Carmine Jinny, MD;  Location: [facility];  Service: Endoscopy;  Laterality: N/A;   ESOPHAGOGASTRODUODENOSCOPY (EGD) WITH PROPOFOL  N/A 10/17/2016   Procedure: ESOPHAGOGASTRODUODENOSCOPY (EGD) WITH PROPOFOL ;  Surgeon: Carmine Jinny, MD;  Location: [facility];  Service: Endoscopy;  Laterality: N/A;   ESOPHAGOGASTRODUODENOSCOPY (EGD) WITH PROPOFOL  N/A 11/13/2017   Procedure: ESOPHAGOGASTRODUODENOSCOPY (EGD) WITH PROPOFOL ;  Surgeon: Jinny Carmine, MD;  Location: [facility];  Service: Endoscopy;  Laterality: N/A;   pins R lower leg Left    rotator cuff replaced Right    SAVORY DILATION N/A 10/17/2016   Procedure: SAVORY DILATION;  Surgeon: Carmine Jinny, MD;  Location: [facility];  Service: Endoscopy;  Laterality: N/A;   TRANSURETHRAL INCISION OF PROSTATE N/A 10/04/2021   Procedure: TRANSURETHRAL INCISION OF THE PROSTATE (TUIP);  Surgeon: Twylla Glendia BROCKS, MD;  Location: [facility];  Service: Urology;  Laterality: N/A;   Patient Active Problem List   Diagnosis Date Noted   Acute cystitis without hematuria 05/15/2023   Pseudomonas infection 05/15/2023   Pseudomonas urinary tract infection 05/14/2023   Left testicular pain 05/13/2023   Generalized weakness 05/13/2023   Cellulitis of scrotum 05/13/2023   Cerebellar stroke, acute ([name]) 05/13/2023   Cellulitis  05/12/2023   Cellulitis of groin 05/12/2023   Carotid stenosis, symptomatic, with infarction ([name]) 06/05/2022   History of colonic polyps    Anxiety state 11/17/2021   Cognitive deficit, post-stroke 11/17/2021   Ex-smoker 11/17/2021   Bradycardia 02/03/2021   Chronic systolic CHF (congestive heart failure), NYHA class 2 ([name]) 05/27/2020   Accelerated junctional rhythm 04/21/2019   Encounter for screening colonoscopy    Polyp of colon    CAD (coronary artery disease) 01/14/2019   Depression 01/14/2019   GERD (gastroesophageal reflux disease) 01/14/2019   Periodic limb movement disorder (PLMD) 01/14/2019   Sleep apnea 01/14/2019   Type 2 diabetes mellitus without complications ([name]) 01/14/2019   Stricture and stenosis of esophagus    Problems with swallowing and mastication    Food impaction of esophagus    H/O coronary artery bypass surgery 01/17/2016   Cerebrovascular accident (CVA) due to embolism ([name]) 01/03/2016   Hypothyroidism, unspecified 01/03/2016   HLD (hyperlipidemia) 01/03/2016   Stable angina ([name]) 11/29/2015   Benign essential hypertension 05/27/2015   Bilateral carotid artery stenosis 02/24/2015   Atherosclerotic  peripheral vascular disease ([name]) 09/07/2014   Bladder calculus 08/12/2014   Primary osteoarthritis of left knee 03/10/2014   Elevated prostate specific antigen (PSA) 02/05/2013   Snoring 01/22/2013   Sleepiness 01/22/2013   Dysphagia 01/22/2013   Occlusion and stenosis of carotid artery without mention of cerebral infarction 01/22/2013   Cerebral thrombosis with cerebral infarction ([name]) 01/22/2013   Benign prostatic hyperplasia with incomplete bladder emptying 08/26/2012   Chronic prostatitis 08/26/2012   Family history of malignant neoplasm of prostate 08/26/2012   Incomplete emptying of bladder 08/26/2012   Dysphagia 05/14/2012   Global aphasia 05/14/2012   Stroke, acute, thrombotic ([name]) 05/12/2012   Symptomatic carotid artery stenosis  05/12/2012   HTN (hypertension) 05/12/2012   COPD (chronic obstructive pulmonary disease) ([name]) 05/12/2012   Acute respiratory failure ([name]) 05/12/2012    FROM INITIAL EVALUATION PCP: Don Lauraine Collar, NP  REFERRING PROVIDER: Don Lauraine Collar, *   REFERRING DIAG: I63.10 (ICD-10-CM) - Cerebral infarction due to embolism of unspecified precerebral artery S06.9XAA (ICD-10-CM) - Unspecified intracranial injury with loss of consciousness status unknown, initial encounter R47.01 (ICD-10-CM) - Aphasia S01.90XA (ICD-10-CM) - Unspecified open wound of unspecified part of head, initial encounter  RATIONALE FOR EVALUATION AND TREATMENT: Rehabilitation  THERAPY DIAG: Unsteadiness on feet  Muscle weakness (generalized)  ONSET DATE: 05/12/23  FOLLOW-UP APPT SCHEDULED WITH REFERRING PROVIDER: Yes    SUBJECTIVE:                                                                                                                                                                                         SUBJECTIVE STATEMENT:   Severe imbalance with R sided weakness;   PERTINENT HISTORY:  Pt referred to PT for unsteadiness s/p CVA. He was hospitalized at [facility] on 05/12/23-05/15/23 for chills, weakness, and left-sided testicular pain for several days. Noted baseline history of CVA with right-sided deficits. This was acutely worsened w/ his other sx. Urinalysis mildly indicative of infection. Troponin 19-28. COVID flu and RSV negative. Lactate within normal limits. CT head- negative acute intracranial abnormality. Old left MCA infarct with left frontal encephalomalacia (R sided weakness). Atrophy, chronic microvascular disease. Brain MRI read as punctate acute or subacute infarct in the right superior cerebellum. No associated hemorrhage or mass effect. Scrotal ultrasound showed bilateral hydroceles, simple on the right and complex (multiseptated) left, bilateral varicoceles, epididymides are enlarged and  heterogeneous in appearance bilaterally, with multiple epididymal cysts and/or spermatoceles, and multiple calcifications. No evidence of testicular torsion, or findings suggestive of epididymo-orchitis.Initially treated with IV rocephin , flagyl , vancomycin  as well as topical miconazole  cream. Neurology and urology were consulted and was admitted for further wrkp/management. Neurology  did not recommend any further neurological workup and he was asymptomatic. Additionally, patient was found to have significantly elevated blood pressures throughout his stay and was not on home therapy. He was started on entresto  given his CAD and HFpEF following permissive hypertensive period however he was taken off after his recent cardiology visit. Pt has had 3 falls since returning home from the hospital. He had a notable worsening of his balance with recent CVA. Recently finished speech therapy.   Pain: No Numbness/Tingling: No Focal Weakness: Yes, RUE/RLE weakness, mild R facial weakness Recent changes in overall health/medication: Yes Prior history of physical therapy for balance:  Yes, 2013 and L MCA infarct Dominant hand: right Imaging: Yes  Red flags: Negative for bowel/bladder changes, personal history of cancer, abdominal pain, chills/fever, night sweats, nausea, vomiting, unrelenting pain,  PRECAUTIONS: None  WEIGHT BEARING RESTRICTIONS: No  FALLS: Has patient fallen in last 6 months? Yes. 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   Patient Goals: Pt wants  to be able to work in the yard, strengthen his R side, and improve his balance.    OBJECTIVE:   Patient Surveys  FOTO: 50, predicted improvement to 14   Cognition Patient is oriented to person, place, and time.  Recent memory is intact.  Remote memory is intact.  Attention span and concentration are intact.  Expressive aphasia noted; Patient's fund of knowledge is within normal limits for educational level.    Gross Musculoskeletal Assessment Tremor: None Bulk: Normal Tone: Normal  Posture: No gross abnormalities noted in standing or seated posture  AROM No gross deficits noted with functional activities  LE MMT: MMT (out of 5) Right  Left   Hip flexion 4+ 4+  Hip extension    Hip abduction (seated) 5 5  Hip adduction (seated) 5 5  Hip internal rotation 5 5  Hip external rotation 5 5  Knee flexion 5 5  Knee extension 5 5  Ankle dorsiflexion 5 5  Ankle plantarflexion Active Active  Ankle inversion    Ankle eversion    (* = pain; Blank rows = not tested)  Slight decrease in R shoulder flexion and R grip strength noted compared to left side  Sensation Grossly intact to light touch throughout bilateral LEs as determined by testing dermatomes L2-S2. Proprioception, stereognosis, and hot/cold testing deferred on this date.  Reflexes Deferred  Cranial Nerves Visual acuity and visual fields are intact  Extraocular muscles are intact  Facial sensation is intact bilaterally  Facial strength demonstrates slight droop in R corner of mouth  Hearing is normal as tested by gross conversation Palate elevates midline, normal phonation  Shoulder shrug strength is intact  Tongue protrudes midline  Coordination/Cerebellar Finger to Nose: Mildly dysmetric RUE Heel to Shin: Mildly dysmetric RLE Rapid alternating movements: WNL Finger Opposition: More difficult R hand Pronator Drift: Negative  Bed mobility: Deferred  Transfers: Assistive device utilized: None  Sit  to stand: Complete Independence Stand to sit: Complete Independence Chair to chair: Complete Independence Floor:  Not tested  Curb:  Not tested  Stairs: Level  of Assistance: Modified independence Stair Negotiation Technique: Step to Pattern with Bilateral Rails Number of Stairs: 4  Height of Stairs: 6  Comments: Utilized step-to pattern during descent only;  Gait: Gait pattern: decreased step length- Left and decreased stance time- Right Distance walked: 100 Assistive device utilized: None Level of assistance: Complete Independence Comments: Slight decrease stance time RLE, decreased self-selected speed  Functional Outcome Measures  Results Comments  BERG 49/56   DGI    FGA    TUG 9.5 seconds   5TSTS 11.8 seconds   6 Minute Walk Test    10 Meter Gait Speed    (Blank rows = not tested)   TODAY'S TREATMENT (09/05/23):    SUBJECTIVE: Pt reports that he is doing well today. No significant changes since the last therapy session. No pain reported. No specific questions or concerns.    PAIN: None reported   Neuromuscular Re-education  Forward/reverse gait in hallway with horizontal/vertical head turns x multiple lengths; Forward high knee marching in hallway with contralateral hand to knee x 2 lengths; Blaze pods with focus setting, 6 pods with 5 distracting colors, 4 cycles, 60s/cycle. 2 pods laterally and 4 on 6 step; Tandem balance alternating forward LE x 30s each; Tandem balance alternating forward LE horizontal and vertical head turns x 30s each; Staggerred balance with front foot on 6 step without UE support eyes open/closed x 30s BLE; Staggerred balance with front foot on 6 step without UE support horizontal/vertical head turns x 30s BLE; Tandem gait on airex beam in // bars with no UE support x multiple lengths; Side stepping on airex beam in // bars x multiple lengths; Alternating 6 step taps without UE support 2 x 10 BLE;   Ther-ex  NuStep L1-4 x 8  minutes for warm-up and BLE strengthening during interval history, therapist adjusting resistance and monitoring fatigue to ensure proper difficulty (5 minutes unbilled); Sit to stand with 8# overhead med ball press 2 x 10;   Not performed: Standing hip strengthening with 5# ankle weights (AW): Hip flexion marches x 45s BLE; HS curls x 45s BLE; Hip abduction x 45s BLE; Hip extension x 45s BLE; Seated LAQ with 5# AW x 45s BLE; 6 forward step-ups without UE support x 10 BLE (pt struggles clearing foot up/down step); Forward gait in // bars to pick up cones and place back on ground x multiple bouts; Airex balance feet together eyes open horizontal and vertical head turns x 30s each; Airex FT ball toss with therapist 2 x 60s;  Total Gym (TG) Level 22 (L22) double leg squats x 15; TG L22 single leg squats 2 x 10 BLE;  Cross-over stepping in // bars with faded UE support x 4 lengths; Forward gait in // bars without UE support with 6 lateral cone taps; TRX squats 2 x 15; Forward BOSU lunges x 10 with each foot forward; Obstacle course with 6 hurdles and Airex pad step up/down in // bars without UE support x multiple lengths; Airex static balance feet apart eyes open/closed x 30s each; Airex balance feet apart eyes open horizontal and vertical head turns x 30s each; Airex static balance feet together eyes open/closed x 30s each; Airex balance feet together eyes open ball passes around body with return pass on opposite side, therapist varying height of return pass x multiple bouts toward each side; Airex alternating 6 step taps without UE support x 10 BLE;   PATIENT EDUCATION:  Education details: Pt educated throughout session about proper  posture and technique with exercises. Improved exercise technique, movement at target joints, use of target muscles after min to mod verbal, visual, tactile cues. Balance exercises Person educated: Patient Education method: Explanation Education  comprehension: verbalized understanding   HOME EXERCISE PROGRAM:  Access Code: [email or web address] URL: [email or web address] Date: 07/03/2023 Prepared by: Selinda Eck  Exercises - Seated March with Resistance  - 1 x daily - 7 x weekly - 3 sets - 30s hold - Seated Hip Adduction Isometrics with Ball  - 1 x daily - 7 x weekly - 3 sets - 30s hold - Seated Hip Abduction with Resistance  - 1 x daily - 7 x weekly - 3 sets - 30s hold - Seated Long Arc Quad  - 1 x daily - 7 x weekly - 3 sets - 30s hold - Mini Squat with Counter Support  - 1 x daily - 7 x weekly - 3 sets - 30s hold - Heel Raises with Counter Support  - 1 x daily - 7 x weekly - 3 sets - 30s hold   ASSESSMENT:  CLINICAL IMPRESSION:   Pt demonstrates excellent motivation during session today. Pt fatigued rather quickly and required frequent rest breaks. Continued balance and BLE strengthening exercises. No HEP updates today. Patient will continue to benefit from skilled therapy to address remaining deficits in order to improve quality of life and return to prior level of function.  OBJECTIVE IMPAIRMENTS: Abnormal gait, decreased balance, decreased coordination, difficulty walking, and decreased strength.   ACTIVITY LIMITATIONS: carrying, lifting, bending, squatting, stairs, and transfers  PARTICIPATION LIMITATIONS: meal prep, cleaning, shopping, community activity, and yard work  PERSONAL FACTORS: Age and 3+ comorbidities: aphasia, CAD, carotid stenosis, prior CVA, CHF, and COPD  are also affecting patient's functional outcome.   REHAB POTENTIAL: Good  CLINICAL DECISION MAKING: Unstable/unpredictable  EVALUATION COMPLEXITY: High   GOALS: Goals reviewed with patient? No  SHORT TERM GOALS: Target date: 08/10/2023  Pt will be independent with HEP in order to improve strength and balance in order to decrease fall risk and improve function at home. Baseline:  Goal status: INITIAL   LONG TERM GOALS: Target date:  08/16/2023  Pt will increase FOTO to at least 62 to demonstrate significant improvement in function at home related to balance  Baseline: 50; 07/31/23: 46 Goal status: ONGOING  2.  Pt will improve BERG by at least 3 points in order to demonstrate clinically significant improvement in balance.   Baseline: 06/21/23: 49/56, 07/31/23: 51/56;  Goal status: PARTIALLY MET  3. Pt will improve DGI by at least 3 points in order to demonstrate clinically significant improvement in balance and decreased risk for falls.     Baseline: 14/24, 07/31/23: 17/24; Goal status: ACHIEVED  4. Pt will increase by at least 62m (149ft) in order to demonstrate clinically significant improvement in cardiopulmonary endurance and community ambulation   Baseline: 24' without assistive device. Occasional R foot shuffling, 07/31/23: 940' without assistive device, occasional R foot shuffling; Goal status: PARTIALLY MET   PLAN: PT FREQUENCY: 2x/week  PT DURATION: 8 weeks  PLANNED INTERVENTIONS: Therapeutic exercises, Therapeutic activity, Neuromuscular re-education, Balance training, Gait training, Patient/Family education, Self Care, Joint mobilization, Joint manipulation, Vestibular training, Canalith repositioning, Orthotic/Fit training, DME instructions, Dry Needling, Electrical stimulation, Spinal manipulation, Spinal mobilization, Cryotherapy, Moist heat, Taping, Traction, Ultrasound, Ionotophoresis 4mg /ml Dexamethasone , Manual therapy, and Re-evaluation.  PLAN FOR NEXT SESSION: Progress balance and strengthening exercises, modify/review HEP (add balance exercises) as necessary;  Geraldene Eisel, SPT [employer]  Jason D Huprich PT, DPT, GCS  Huprich,Jason, PT, DPT  09/05/2023, 8:08 AM

## 2023-09-06 ENCOUNTER — Ambulatory Visit: Payer: Medicare HMO

## 2023-09-06 DIAGNOSIS — M6281 Muscle weakness (generalized): Secondary | ICD-10-CM | POA: Diagnosis not present

## 2023-09-06 DIAGNOSIS — R2681 Unsteadiness on feet: Secondary | ICD-10-CM

## 2023-09-06 NOTE — Therapy (Signed)
 OUTPATIENT PHYSICAL THERAPY BALANCE TREATMENT   Patient Name: Paul Bass MRN: [id number] DOB:1946-04-11, 78 y.o., male Today's Date: 09/07/2023  END OF SESSION:  PT End of Session - 09/06/23 1111     Visit Number 19    Number of Visits 33    Date for PT Re-Evaluation 10/10/23    Authorization Type eval: 06/21/23, recert: 08/15/23    PT Start Time 1100    PT Stop Time 1145    PT Time Calculation (min) 45 min    Equipment Utilized During Treatment Gait belt    Activity Tolerance Patient tolerated treatment well    Behavior During Therapy WFL for tasks assessed/performed            Past Medical History:  Diagnosis Date   Accelerated junctional rhythm    Anemia    Anxiety    a.) Tx'd with BZO PRN   Arthritis    Bilateral carotid artery disease ([name])    BPH (benign prostatic hypertrophy)    CAD S/P percutaneous coronary angioplasty    a.) PCI in 2002 placing a RCA stent (unknown type). b.) LHC 12/07/2015: 5% ISR m-dRCA, 90% dRCA, 25% pRCA, 25% p-mLCx, 100% OM3, 95% oOM2-OM2, 75% m-dLAD; refer to CVTS. c.) 3v CABG 01/03/2016   CHF (congestive heart failure) ([name])    a.) TTE 05/12/2012: EF 50-55%; mild LA enlargement; G1DD. b.)  TTE 11/26/2015: EF 45%; mild BAE; triv PR, mild MR/TR; inferolateral HK; G1DD. c.)  TTE 11/07/2017: EF 35%; RV enlargement; BAE; inferior and inferolateral HK; triv PR; mild MR/TR. d.)  TTE 09/17/2019: EF 35%; mild LVH; triv MR/TR. e.)  TTE 08/11/2021: EF 50%; triv MR/TR; G1DD.   Cognitive deficit following cerebrovascular accident (CVA)    COPD (chronic obstructive pulmonary disease) ([name])    Cortical cataract    Depression    Dyspnea    GERD (gastroesophageal reflux disease)    History of 2019 novel coronavirus disease (COVID-19) 01/19/2021   History of acute inferior wall MI 2002   a.) PCI was performed placing a RCA stent (unknown type)   History of kidney stones    HTN (hypertension)    Hyperlipidemia    Hypothyroidism    Long term current  use of antithrombotics/antiplatelets    a.) DAPT therapy (ASA + clopidogrel )   OSA (obstructive sleep apnea)    a.) does not require nocturnal PAP therapy   PLMD (periodic limb movement disorder)    Postoperative atrial fibrillation ([name]) 01/03/2016   a.) following CABG procedure   Prostatitis    PVD (peripheral vascular disease) ([name])    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 b/) residual RIGHT sided weakness   T2DM (type 2 diabetes mellitus) ([name])    Tobacco abuse    Past Surgical History:  Procedure Laterality Date   CARDIAC CATHETERIZATION N/A 12/07/2015   Procedure: Left Heart Cath and Coronary Angiography;  Surgeon: Wolm JINNY Rhyme, MD;  Location: [facility];  Service: Cardiovascular;  Laterality: N/A;   CAROTID ANGIOGRAPHY N/A 06/05/2022   Procedure: CAROTID ANGIOGRAPHY;  Surgeon: Marea Selinda RAMAN, MD;  Location: [facility];  Service: Cardiovascular;  Laterality: N/A;   CAROTID PTA/STENT INTERVENTION Left    HX: 2 stents   CATARACT EXTRACTION W/PHACO  Left 03/24/2021   Procedure: CATARACT EXTRACTION PHACO AND INTRAOCULAR LENS PLACEMENT (IOC) LEFT DIABETIC 6.67 00:42.5;  Surgeon: Jaye Fallow, MD;  Location: [facility];  Service: Ophthalmology;  Laterality: Left;   COLONOSCOPY WITH PROPOFOL  N/A 02/18/2019   Procedure: COLONOSCOPY WITH PROPOFOL ;  Surgeon: Jinny Carmine, MD;  Location: [facility];  Service: Endoscopy;  Laterality: N/A;   COLONOSCOPY WITH PROPOFOL  N/A 12/22/2021   Procedure: COLONOSCOPY WITH PROPOFOL ;  Surgeon: Jinny Carmine, MD;  Location: [facility];  Service: Endoscopy;  Laterality: N/A;   CORONARY ANGIOPLASTY WITH STENT PLACEMENT Left 2002   CORONARY ARTERY BYPASS GRAFT N/A 01/03/2016   Procedure: 3v  CORONARY ARTERY BYPASS GRAFT (LIMA-LAD, SVG-PDA, SVG-OM2); Location: [facility]; Surgeon: Debby Nova, MD   CYSTOSCOPY W/ URETERAL STENT REMOVAL N/A 10/04/2021   Procedure: CYSTOSCOPY WITH PROSTATE FOREIGN BODY REMOVAL;  Surgeon: Twylla Glendia BROCKS, MD;  Location: [facility];  Service: Urology;  Laterality: N/A;   CYSTOSCOPY WITH INSERTION OF UROLIFT N/A 04/29/2019   Procedure: CYSTOSCOPY WITH INSERTION OF UROLIFT;  Surgeon: Twylla Glendia BROCKS, MD;  Location: [facility];  Service: Urology;  Laterality: N/A;   ESOPHAGOGASTRODUODENOSCOPY (EGD) WITH PROPOFOL  N/A 09/11/2016   Procedure: ESOPHAGOGASTRODUODENOSCOPY (EGD) WITH PROPOFOL ;  Surgeon: Carmine Jinny, MD;  Location: [facility];  Service: Endoscopy;  Laterality: N/A;   ESOPHAGOGASTRODUODENOSCOPY (EGD) WITH PROPOFOL  N/A 10/17/2016   Procedure: ESOPHAGOGASTRODUODENOSCOPY (EGD) WITH PROPOFOL ;  Surgeon: Carmine Jinny, MD;  Location: [facility];  Service: Endoscopy;  Laterality: N/A;   ESOPHAGOGASTRODUODENOSCOPY (EGD) WITH PROPOFOL  N/A 11/13/2017   Procedure: ESOPHAGOGASTRODUODENOSCOPY (EGD) WITH PROPOFOL ;  Surgeon: Jinny Carmine, MD;  Location: [facility];  Service: Endoscopy;  Laterality: N/A;   pins R lower leg Left    rotator cuff replaced Right    SAVORY DILATION N/A 10/17/2016   Procedure: SAVORY DILATION;  Surgeon: Carmine Jinny, MD;  Location: [facility];  Service: Endoscopy;  Laterality: N/A;   TRANSURETHRAL INCISION OF PROSTATE N/A 10/04/2021   Procedure: TRANSURETHRAL INCISION OF THE PROSTATE (TUIP);  Surgeon: Twylla Glendia BROCKS, MD;  Location: [facility];  Service: Urology;  Laterality: N/A;   Patient Active Problem List   Diagnosis Date Noted   Acute cystitis without hematuria 05/15/2023   Pseudomonas infection 05/15/2023   Pseudomonas urinary tract infection 05/14/2023   Left testicular pain 05/13/2023   Generalized weakness 05/13/2023   Cellulitis of scrotum 05/13/2023   Cerebellar stroke, acute ([name]) 05/13/2023   Cellulitis  05/12/2023   Cellulitis of groin 05/12/2023   Carotid stenosis, symptomatic, with infarction ([name]) 06/05/2022   History of colonic polyps    Anxiety state 11/17/2021   Cognitive deficit, post-stroke 11/17/2021   Ex-smoker 11/17/2021   Bradycardia 02/03/2021   Chronic systolic CHF (congestive heart failure), NYHA class 2 ([name]) 05/27/2020   Accelerated junctional rhythm 04/21/2019   Encounter for screening colonoscopy    Polyp of colon    CAD (coronary artery disease) 01/14/2019   Depression 01/14/2019   GERD (gastroesophageal reflux disease) 01/14/2019   Periodic limb movement disorder (PLMD) 01/14/2019   Sleep apnea 01/14/2019   Type 2 diabetes mellitus without complications ([name]) 01/14/2019   Stricture and stenosis of esophagus    Problems with swallowing and mastication    Food impaction of esophagus    H/O coronary artery bypass surgery 01/17/2016   Cerebrovascular accident (CVA) due to embolism ([name]) 01/03/2016   Hypothyroidism, unspecified 01/03/2016   HLD (hyperlipidemia) 01/03/2016   Stable angina ([name]) 11/29/2015   Benign essential hypertension 05/27/2015   Bilateral carotid artery stenosis 02/24/2015   Atherosclerotic  peripheral vascular disease ([name]) 09/07/2014   Bladder calculus 08/12/2014   Primary osteoarthritis of left knee 03/10/2014   Elevated prostate specific antigen (PSA) 02/05/2013   Snoring 01/22/2013   Sleepiness 01/22/2013   Dysphagia 01/22/2013   Occlusion and stenosis of carotid artery without mention of cerebral infarction 01/22/2013   Cerebral thrombosis with cerebral infarction ([name]) 01/22/2013   Benign prostatic hyperplasia with incomplete bladder emptying 08/26/2012   Chronic prostatitis 08/26/2012   Family history of malignant neoplasm of prostate 08/26/2012   Incomplete emptying of bladder 08/26/2012   Dysphagia 05/14/2012   Global aphasia 05/14/2012   Stroke, acute, thrombotic ([name]) 05/12/2012   Symptomatic carotid artery stenosis  05/12/2012   HTN (hypertension) 05/12/2012   COPD (chronic obstructive pulmonary disease) ([name]) 05/12/2012   Acute respiratory failure ([name]) 05/12/2012    FROM INITIAL EVALUATION PCP: Don Lauraine Collar, NP  REFERRING PROVIDER: Don Lauraine Collar, *   REFERRING DIAG: I63.10 (ICD-10-CM) - Cerebral infarction due to embolism of unspecified precerebral artery S06.9XAA (ICD-10-CM) - Unspecified intracranial injury with loss of consciousness status unknown, initial encounter R47.01 (ICD-10-CM) - Aphasia S01.90XA (ICD-10-CM) - Unspecified open wound of unspecified part of head, initial encounter  RATIONALE FOR EVALUATION AND TREATMENT: Rehabilitation  THERAPY DIAG: Unsteadiness on feet  Muscle weakness (generalized)  ONSET DATE: 05/12/23  FOLLOW-UP APPT SCHEDULED WITH REFERRING PROVIDER: Yes    SUBJECTIVE:                                                                                                                                                                                         SUBJECTIVE STATEMENT:   Severe imbalance with R sided weakness;  PERTINENT HISTORY:  Pt referred to PT for unsteadiness s/p CVA. He was hospitalized at [facility] on 05/12/23-05/15/23 for chills, weakness, and left-sided testicular pain for several days. Noted baseline history of CVA with right-sided deficits. This was acutely worsened w/ his other sx. Urinalysis mildly indicative of infection. Troponin 19-28. COVID flu and RSV negative. Lactate within normal limits. CT head- negative acute intracranial abnormality. Old left MCA infarct with left frontal encephalomalacia (R sided weakness). Atrophy, chronic microvascular disease. Brain MRI read as punctate acute or subacute infarct in the right superior cerebellum. No associated hemorrhage or mass effect. Scrotal ultrasound showed bilateral hydroceles, simple on the right and complex (multiseptated) left, bilateral varicoceles, epididymides are enlarged and  heterogeneous in appearance bilaterally, with multiple epididymal cysts and/or spermatoceles, and multiple calcifications. No evidence of testicular torsion, or findings suggestive of epididymo-orchitis.Initially treated with IV rocephin , flagyl , vancomycin  as well as topical miconazole  cream. Neurology and urology were consulted and was admitted for further wrkp/management. Neurology did  not recommend any further neurological workup and he was asymptomatic. Additionally, patient was found to have significantly elevated blood pressures throughout his stay and was not on home therapy. He was started on entresto  given his CAD and HFpEF following permissive hypertensive period however he was taken off after his recent cardiology visit. Pt has had 3 falls since returning home from the hospital. He had a notable worsening of his balance with recent CVA. Recently finished speech therapy.   Pain: No Numbness/Tingling: No Focal Weakness: Yes, RUE/RLE weakness, mild R facial weakness Recent changes in overall health/medication: Yes Prior history of physical therapy for balance:  Yes, 2013 and L MCA infarct Dominant hand: right Imaging: Yes  Red flags: Negative for bowel/bladder changes, personal history of cancer, abdominal pain, chills/fever, night sweats, nausea, vomiting, unrelenting pain,  PRECAUTIONS: None  WEIGHT BEARING RESTRICTIONS: No  FALLS: Has patient fallen in last 6 months? Yes. 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   Patient Goals: Pt wants  to be able to work in the yard, strengthen his R side, and improve his balance.    OBJECTIVE:   Patient Surveys  FOTO: 50, predicted improvement to 77   Cognition Patient is oriented to person, place, and time.  Recent memory is intact.  Remote memory is intact.  Attention span and concentration are intact.  Expressive aphasia noted; Patient's fund of knowledge is within normal limits for educational level.    Gross Musculoskeletal Assessment Tremor: None Bulk: Normal Tone: Normal  Posture: No gross abnormalities noted in standing or seated posture  AROM No gross deficits noted with functional activities  LE MMT: MMT (out of 5) Right  Left   Hip flexion 4+ 4+  Hip extension    Hip abduction (seated) 5 5  Hip adduction (seated) 5 5  Hip internal rotation 5 5  Hip external rotation 5 5  Knee flexion 5 5  Knee extension 5 5  Ankle dorsiflexion 5 5  Ankle plantarflexion Active Active  Ankle inversion    Ankle eversion    (* = pain; Blank rows = not tested)  Slight decrease in R shoulder flexion and R grip strength noted compared to left side  Sensation Grossly intact to light touch throughout bilateral LEs as determined by testing dermatomes L2-S2. Proprioception, stereognosis, and hot/cold testing deferred on this date.  Reflexes Deferred  Cranial Nerves Visual acuity and visual fields are intact  Extraocular muscles are intact  Facial sensation is intact bilaterally  Facial strength demonstrates slight droop in R corner of mouth  Hearing is normal as tested by gross conversation Palate elevates midline, normal phonation  Shoulder shrug strength is intact  Tongue protrudes midline  Coordination/Cerebellar Finger to Nose: Mildly dysmetric RUE Heel to Shin: Mildly dysmetric RLE Rapid alternating movements: WNL Finger Opposition: More difficult R hand Pronator Drift: Negative  Bed mobility: Deferred  Transfers: Assistive device utilized: None  Sit  to stand: Complete Independence Stand to sit: Complete Independence Chair to chair: Complete Independence Floor:  Not tested  Curb:  Not tested  Stairs: Level of  Assistance: Modified independence Stair Negotiation Technique: Step to Pattern with Bilateral Rails Number of Stairs: 4  Height of Stairs: 6  Comments: Utilized step-to pattern during descent only;  Gait: Gait pattern: decreased step length- Left and decreased stance time- Right Distance walked: 100 Assistive device utilized: None Level of assistance: Complete Independence Comments: Slight decrease stance time RLE, decreased self-selected speed  Functional Outcome Measures  Results Comments  BERG 49/56   DGI    FGA    TUG 9.5 seconds   5TSTS 11.8 seconds   6 Minute Walk Test    10 Meter Gait Speed    (Blank rows = not tested)   TODAY'S TREATMENT (09/07/23):    SUBJECTIVE: Pt reports that he is doing well today. He notes being more fatigued than usual. No significant changes since the last therapy session. No pain reported. No specific questions or concerns.    PAIN: None reported   Neuromuscular Re-education  Alternating 6 step taps without UE support 2 x 10 BLE; 6 forward step-ups without UE support x 10 BLE (pt struggles clearing foot up/down step); Airex FT ball toss with therapist 2 x 60s;  Tandem gait in // bars with no UE support x multiple lengths; Side stepping in // bars x multiple lengths; Forward gait in // bars to pick up cones and place back on ground x multiple bouts;   Ther-ex  NuStep L1-4 x 8 minutes for warm-up and BLE strengthening during interval history, therapist adjusting resistance and monitoring fatigue to ensure proper difficulty (5 minutes unbilled); Sit to stand with 8# overhead med ball press 2 x 10;  Standing hip strengthening with 5# ankle weights (AW): Hip flexion marches x 15 BLE; HS curls x 15 BLE; Hip abduction x 15 BLE; Hip extension x 15 BLE; Seated LAQ with  5# AW x 15 BLE;   Not performed: Forward/reverse gait in hallway with horizontal/vertical head turns x multiple lengths; Forward high knee marching in hallway with contralateral hand to knee x 2 lengths; Blaze pods with focus setting, 6 pods with 5 distracting colors, 4 cycles, 60s/cycle. 2 pods laterally and 4 on 6 step; Tandem balance alternating forward LE x 30s each; Tandem balance alternating forward LE horizontal and vertical head turns x 30s each; Staggerred balance with front foot on 6 step without UE support eyes open/closed x 30s BLE; Staggerred balance with front foot on 6 step without UE support horizontal/vertical head turns x 30s BLE; Airex balance feet together eyes open horizontal and vertical head turns x 30s each; Total Gym (TG) Level 22 (L22) double leg squats x 15; TG L22 single leg squats 2 x 10 BLE;  Cross-over stepping in // bars with faded UE support x 4 lengths; Forward gait in // bars without UE support with 6 lateral cone taps; TRX squats 2 x 15; Forward BOSU lunges x 10 with each foot forward; Obstacle course with 6 hurdles and Airex pad step up/down in // bars without UE support x multiple lengths; Airex static balance feet apart eyes open/closed x 30s each; Airex balance feet apart eyes open horizontal and vertical head turns x 30s each; Airex static balance feet together eyes open/closed x 30s each; Airex balance feet together eyes open ball passes around body with return pass on opposite side, therapist varying height of return pass x multiple bouts toward each side; Airex alternating 6 step taps without UE support x 10 BLE;   PATIENT EDUCATION:  Education details: Pt educated throughout session about  proper posture and technique with exercises. Improved exercise technique, movement at target joints, use of target muscles after min to mod verbal, visual, tactile cues. Balance exercises Person educated: Patient Education method: Explanation Education  comprehension: verbalized understanding   HOME EXERCISE PROGRAM:  Access Code: [email or web address] URL: [email or web address] Date: 07/03/2023 Prepared by: Selinda Eck  Exercises - Seated March with Resistance  - 1 x daily - 7 x weekly - 3 sets - 30s hold - Seated Hip Adduction Isometrics with Ball  - 1 x daily - 7 x weekly - 3 sets - 30s hold - Seated Hip Abduction with Resistance  - 1 x daily - 7 x weekly - 3 sets - 30s hold - Seated Long Arc Quad  - 1 x daily - 7 x weekly - 3 sets - 30s hold - Mini Squat with Counter Support  - 1 x daily - 7 x weekly - 3 sets - 30s hold - Heel Raises with Counter Support  - 1 x daily - 7 x weekly - 3 sets - 30s hold   ASSESSMENT:  CLINICAL IMPRESSION:   Pt demonstrates excellent motivation during session today. Pt fatigued rather quickly and required frequent rest breaks. Continued balance and BLE strengthening exercises. No HEP updates today. Patient will continue to benefit from skilled therapy to address remaining deficits in order to improve quality of life and return to prior level of function.  OBJECTIVE IMPAIRMENTS: Abnormal gait, decreased balance, decreased coordination, difficulty walking, and decreased strength.   ACTIVITY LIMITATIONS: carrying, lifting, bending, squatting, stairs, and transfers  PARTICIPATION LIMITATIONS: meal prep, cleaning, shopping, community activity, and yard work  PERSONAL FACTORS: Age and 3+ comorbidities: aphasia, CAD, carotid stenosis, prior CVA, CHF, and COPD  are also affecting patient's functional outcome.   REHAB POTENTIAL: Good  CLINICAL DECISION MAKING: Unstable/unpredictable  EVALUATION COMPLEXITY: High   GOALS: Goals reviewed with patient? No  SHORT TERM GOALS: Target date: 08/10/2023  Pt will be independent with HEP in order to improve strength and balance in order to decrease fall risk and improve function at home. Baseline:  Goal status: INITIAL   LONG TERM GOALS: Target date:  08/16/2023  Pt will increase FOTO to at least 62 to demonstrate significant improvement in function at home related to balance  Baseline: 50; 07/31/23: 46 Goal status: ONGOING  2.  Pt will improve BERG by at least 3 points in order to demonstrate clinically significant improvement in balance.   Baseline: 06/21/23: 49/56, 07/31/23: 51/56;  Goal status: PARTIALLY MET  3. Pt will improve DGI by at least 3 points in order to demonstrate clinically significant improvement in balance and decreased risk for falls.     Baseline: 14/24, 07/31/23: 17/24; Goal status: ACHIEVED  4. Pt will increase by at least 7m (172ft) in order to demonstrate clinically significant improvement in cardiopulmonary endurance and community ambulation   Baseline: 41' without assistive device. Occasional R foot shuffling, 07/31/23: 940' without assistive device, occasional R foot shuffling; Goal status: PARTIALLY MET   PLAN: PT FREQUENCY: 2x/week  PT DURATION: 8 weeks  PLANNED INTERVENTIONS: Therapeutic exercises, Therapeutic activity, Neuromuscular re-education, Balance training, Gait training, Patient/Family education, Self Care, Joint mobilization, Joint manipulation, Vestibular training, Canalith repositioning, Orthotic/Fit training, DME instructions, Dry Needling, Electrical stimulation, Spinal manipulation, Spinal mobilization, Cryotherapy, Moist heat, Taping, Traction, Ultrasound, Ionotophoresis 4mg /ml Dexamethasone , Manual therapy, and Re-evaluation.  PLAN FOR NEXT SESSION: Progress balance and strengthening exercises, modify/review HEP (add balance exercises) as necessary;  Kaisyn Millea, SPT [employer]  Jason D Huprich PT, DPT, GCS  Huprich,Jason, PT, DPT  09/07/2023, 9:35 AM

## 2023-09-11 ENCOUNTER — Ambulatory Visit: Payer: Medicare HMO

## 2023-09-11 DIAGNOSIS — M6281 Muscle weakness (generalized): Secondary | ICD-10-CM

## 2023-09-11 DIAGNOSIS — R2681 Unsteadiness on feet: Secondary | ICD-10-CM

## 2023-09-11 NOTE — Therapy (Signed)
OUTPATIENT PHYSICAL THERAPY BALANCE TREATMENT/PROGRESS NOTE/DISCHARGE  Dates of reporting period  07/31/23   to   09/11/23    Patient Name: Paul Bass MRN: [id number] DOB:1946-01-23, 78 y.o., male Today's Date: 09/11/2023  END OF SESSION:  PT End of Session - 09/11/23 1056     Visit Number 20    Number of Visits 33    Date for PT Re-Evaluation 10/10/23    Authorization Type eval: 06/21/23, recert: 08/15/23    PT Start Time 1100    PT Stop Time 1145    PT Time Calculation (min) 45 min    Equipment Utilized During Treatment Gait belt    Activity Tolerance Patient tolerated treatment well    Behavior During Therapy WFL for tasks assessed/performed            Past Medical History:  Diagnosis Date   Accelerated junctional rhythm    Anemia    Anxiety    a.) Tx'd with BZO PRN   Arthritis    Bilateral carotid artery disease ([name])    BPH (benign prostatic hypertrophy)    CAD S/P percutaneous coronary angioplasty    a.) PCI in 2002 placing a RCA stent (unknown type). b.) LHC 12/07/2015: 5% ISR m-dRCA, 90% dRCA, 25% pRCA, 25% p-mLCx, 100% OM3, 95% oOM2-OM2, 75% m-dLAD; refer to CVTS. c.) 3v CABG 01/03/2016   CHF (congestive heart failure) ([name])    a.) TTE 05/12/2012: EF 50-55%; mild LA enlargement; G1DD. b.)  TTE 11/26/2015: EF 45%; mild BAE; triv PR, mild MR/TR; inferolateral HK; G1DD. c.)  TTE 11/07/2017: EF 35%; RV enlargement; BAE; inferior and inferolateral HK; triv PR; mild MR/TR. d.)  TTE 09/17/2019: EF 35%; mild LVH; triv MR/TR. e.)  TTE 08/11/2021: EF 50%; triv MR/TR; G1DD.   Cognitive deficit following cerebrovascular accident (CVA)    COPD (chronic obstructive pulmonary disease) ([name])    Cortical cataract    Depression    Dyspnea    GERD (gastroesophageal reflux disease)    History of 2019 novel coronavirus disease (COVID-19) 01/19/2021   History of acute inferior wall MI 2002   a.) PCI was performed placing a RCA stent (unknown type)   History of kidney stones     HTN (hypertension)    Hyperlipidemia    Hypothyroidism    Long term current use of antithrombotics/antiplatelets    a.) DAPT therapy (ASA + clopidogrel)   OSA (obstructive sleep apnea)    a.) does not require nocturnal PAP therapy   PLMD (periodic limb movement disorder)    Postoperative atrial fibrillation ([name]) 01/03/2016   a.) following CABG procedure   Prostatitis    PVD (peripheral vascular disease) ([name])    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 b/) residual RIGHT sided weakness   T2DM (type 2 diabetes mellitus) ([name])    Tobacco abuse    Past Surgical History:  Procedure Laterality Date   CARDIAC CATHETERIZATION N/A 12/07/2015   Procedure: Left Heart Cath and Coronary Angiography;  Surgeon: Lamar Blinks, MD;  Location: [facility];  Service: Cardiovascular;  Laterality: N/A;   CAROTID ANGIOGRAPHY N/A 06/05/2022   Procedure: CAROTID ANGIOGRAPHY;  Surgeon: Annice Needy, MD;  Location: [facility];  Service: Cardiovascular;  Laterality: N/A;  CAROTID PTA/STENT INTERVENTION Left    HX: 2 stents   CATARACT EXTRACTION W/PHACO Left 03/24/2021   Procedure: CATARACT EXTRACTION PHACO AND INTRAOCULAR LENS PLACEMENT (IOC) LEFT DIABETIC 6.67 00:42.5;  Surgeon: Galen Manila, MD;  Location: [facility];  Service: Ophthalmology;  Laterality: Left;   COLONOSCOPY WITH PROPOFOL N/A 02/18/2019   Procedure: COLONOSCOPY WITH PROPOFOL;  Surgeon: Midge Minium, MD;  Location: [facility]OSCOPY;  Service: Endoscopy;  Laterality: N/A;   COLONOSCOPY WITH PROPOFOL N/A 12/22/2021   Procedure: COLONOSCOPY WITH PROPOFOL;  Surgeon: Midge Minium, MD;  Location: [facility] ENDOSCOPY;  Service: Endoscopy;  Laterality: N/A;   CORONARY ANGIOPLASTY WITH STENT  PLACEMENT Left 2002   CORONARY ARTERY BYPASS GRAFT N/A 01/03/2016   Procedure: 3v CORONARY ARTERY BYPASS GRAFT (LIMA-LAD, SVG-PDA, SVG-OM2); Location: [facility]; Surgeon: Cain Sieve, MD   CYSTOSCOPY W/ URETERAL STENT REMOVAL N/A 10/04/2021   Procedure: CYSTOSCOPY WITH PROSTATE FOREIGN BODY REMOVAL;  Surgeon: Riki Altes, MD;  Location: [facility];  Service: Urology;  Laterality: N/A;   CYSTOSCOPY WITH INSERTION OF UROLIFT N/A 04/29/2019   Procedure: CYSTOSCOPY WITH INSERTION OF UROLIFT;  Surgeon: Riki Altes, MD;  Location: [facility];  Service: Urology;  Laterality: N/A;   ESOPHAGOGASTRODUODENOSCOPY (EGD) WITH PROPOFOL N/A 09/11/2016   Procedure: ESOPHAGOGASTRODUODENOSCOPY (EGD) WITH PROPOFOL;  Surgeon: Midge Minium, MD;  Location: [facility] ENDOSCOPY;  Service: Endoscopy;  Laterality: N/A;   ESOPHAGOGASTRODUODENOSCOPY (EGD) WITH PROPOFOL N/A 10/17/2016   Procedure: ESOPHAGOGASTRODUODENOSCOPY (EGD) WITH PROPOFOL;  Surgeon: Midge Minium, MD;  Location: [facility] ENDOS[facility];  Service: Endoscopy;  Laterality: N/A;   ESOPHAGOGASTRODUODENOSCOPY (EGD) WITH PROPOFOL N/A 11/13/2017   Procedure: ESOPHAGOGASTRODUODENOSCOPY (EGD) WITH PROPOFOL;  Surgeon: Midge Minium, MD;  Location: [facility] ENDOSCOPY;  Service: Endoscopy;  Laterality: N/A;   pins R lower leg Left    rotator cuff replaced Right    SAVORY DILATION N/A 10/17/2016   Procedure: SAVORY DILATION;  Surgeon: Midge Minium, MD;  Location: [facility] ENDOSCOPY;  Service: Endoscopy;  Laterality: N/A;   TRANSURETHRAL INCISION OF PROSTATE N/A 10/04/2021   Procedure: TRANSURETHRAL INCISION OF THE PROSTATE (TUIP);  Surgeon: Riki Altes, MD;  Location: [facility];  Service: Urology;  Laterality: N/A;   Patient Active Problem List   Diagnosis Date Noted   Acute cystitis without hematuria 05/15/2023   Pseudomonas infection 05/15/2023   Pseudomonas urinary tract infection 05/14/2023   Left testicular pain 05/13/2023   Generalized weakness 05/13/2023   Cellulitis of  scrotum 05/13/2023   Cerebellar stroke, acute ([name]) 05/13/2023   Cellulitis 05/12/2023   Cellulitis of groin 05/12/2023   Carotid stenosis, symptomatic, with infarction ([name]) 06/05/2022   History of colonic polyps    Anxiety state 11/17/2021   Cognitive deficit, post-stroke 11/17/2021   Ex-smoker 11/17/2021   Bradycardia 02/03/2021   Chronic systolic CHF (congestive heart failure), NYHA class 2 ([name]) 05/27/2020   Accelerated junctional rhythm 04/21/2019   Encounter for screening colonoscopy    Polyp of colon    CAD (coronary artery disease) 01/14/2019   Depression 01/14/2019   GERD (gastroesophageal reflux disease) 01/14/2019   Periodic limb movement disorder (PLMD) 01/14/2019   Sleep apnea 01/14/2019   Type 2 diabetes mellitus without complications ([name]) 01/14/2019   Stricture and stenosis of esophagus    Problems with swallowing and mastication    Food impaction of esophagus    H/O coronary artery bypass surgery 01/17/2016   Cerebrovascular accident (CVA) due to embolism ([name]) 01/03/2016   Hypothyroidism, unspecified 01/03/2016   HLD (hyperlipidemia) 01/03/2016   Stable angina ([name]) 11/29/2015  Benign essential hypertension 05/27/2015   Bilateral carotid artery stenosis 02/24/2015   Atherosclerotic peripheral vascular disease ([name]) 09/07/2014   Bladder calculus 08/12/2014   Primary osteoarthritis of left knee 03/10/2014   Elevated prostate specific antigen (PSA) 02/05/2013   Snoring 01/22/2013   Sleepiness 01/22/2013   Dysphagia 01/22/2013   Occlusion and stenosis of carotid artery without mention of cerebral infarction 01/22/2013   Cerebral thrombosis with cerebral infarction ([name]) 01/22/2013   Benign prostatic hyperplasia with incomplete bladder emptying 08/26/2012   Chronic prostatitis 08/26/2012   Family history of malignant neoplasm of prostate 08/26/2012   Incomplete emptying of bladder 08/26/2012   Dysphagia 05/14/2012   Global aphasia 05/14/2012   Stroke,  acute, thrombotic ([name]) 05/12/2012   Symptomatic carotid artery stenosis 05/12/2012   HTN (hypertension) 05/12/2012   COPD (chronic obstructive pulmonary disease) ([name]) 05/12/2012   Acute respiratory failure ([name]) 05/12/2012    FROM INITIAL EVALUATION PCP: Myrene Buddy, NP  REFERRING PROVIDER: Myrene Buddy, *   REFERRING DIAG: I63.10 (ICD-10-CM) - Cerebral infarction due to embolism of unspecified precerebral artery S06.9XAA (ICD-10-CM) - Unspecified intracranial injury with loss of consciousness status unknown, initial encounter R47.01 (ICD-10-CM) - Aphasia S01.90XA (ICD-10-CM) - Unspecified open wound of unspecified part of head, initial encounter  RATIONALE FOR EVALUATION AND TREATMENT: Rehabilitation  THERAPY DIAG: Unsteadiness on feet  Muscle weakness (generalized)  ONSET DATE: 05/12/23  FOLLOW-UP APPT SCHEDULED WITH REFERRING PROVIDER: Yes    SUBJECTIVE:                                                                                                                                                                                         SUBJECTIVE STATEMENT:   Severe imbalance with R sided weakness;  PERTINENT HISTORY:  Pt referred to PT for unsteadiness s/p CVA. He was hospitalized at [facility] on 05/12/23-05/15/23 for chills, weakness, and left-sided testicular pain for several days. Noted baseline history of CVA with right-sided deficits. This was acutely worsened w/ his other sx. Urinalysis mildly indicative of infection. Troponin 19-28. COVID flu and RSV negative. Lactate within normal limits. CT head- negative acute intracranial abnormality. Old left MCA infarct with left frontal encephalomalacia (R sided weakness). Atrophy, chronic microvascular disease. Brain MRI read as punctate acute or subacute infarct in the right superior cerebellum. No associated hemorrhage or mass effect. Scrotal ultrasound showed bilateral hydroceles, simple on the right and complex  (multiseptated) left, bilateral varicoceles, epididymides are enlarged and heterogeneous in appearance bilaterally, with multiple epididymal cysts and/or spermatoceles, and multiple calcifications. No evidence of testicular torsion, or findings suggestive of epididymo-orchitis.Initially treated with IV rocephin, flagyl, vancomycin as well as topical miconazole  cream. Neurology and urology were consulted and was admitted for further wrkp/management. Neurology did not recommend any further neurological workup and he was asymptomatic. Additionally, patient was found to have significantly elevated blood pressures throughout his stay and was not on home therapy. He was started on entresto given his CAD and HFpEF following permissive hypertensive period however he was taken off after his recent cardiology visit. Pt has had 3 falls since returning home from the hospital. He had a notable worsening of his balance with recent CVA. Recently finished speech therapy.   Pain: No Numbness/Tingling: No Focal Weakness: Yes, RUE/RLE weakness, mild R facial weakness Recent changes in overall health/medication: Yes Prior history of physical therapy for balance:  Yes, 2013 and L MCA infarct Dominant hand: right Imaging: Yes  Red flags: Negative for bowel/bladder changes, personal history of cancer, abdominal pain, chills/fever, night sweats, nausea, vomiting, unrelenting pain,  PRECAUTIONS: None  WEIGHT BEARING RESTRICTIONS: No  FALLS: Has patient fallen in last 6 months? Yes. 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   Patient Goals: Pt wants to be able to work in the yard, strengthen his R side, and improve his balance.    OBJECTIVE:   Patient Surveys  FOTO: 50, predicted improvement to 60   Cognition Patient is oriented to person, place, and time.  Recent memory is intact.  Remote memory is intact.  Attention span and concentration are intact.  Expressive aphasia noted; Patient's fund of knowledge is within normal limits for educational level.    Gross Musculoskeletal Assessment Tremor: None Bulk: Normal Tone: Normal  Posture: No gross abnormalities noted in standing or seated posture  AROM No gross deficits noted with functional activities  LE MMT: MMT (out of 5) Right  Left   Hip flexion 4+ 4+  Hip extension    Hip abduction (seated) 5 5  Hip adduction (seated) 5 5  Hip internal rotation 5 5  Hip external rotation 5 5  Knee flexion 5 5  Knee extension 5 5  Ankle dorsiflexion 5 5  Ankle plantarflexion Active Active  Ankle inversion    Ankle eversion    (* = pain; Blank rows = not tested)  Slight decrease in R shoulder flexion and R grip strength noted compared to left side  Sensation Grossly intact to light touch throughout bilateral LEs as determined by testing dermatomes L2-S2. Proprioception, stereognosis, and hot/cold testing deferred on this date.  Reflexes Deferred  Cranial Nerves Visual acuity and visual fields are intact  Extraocular muscles are intact  Facial sensation is intact bilaterally  Facial strength demonstrates slight droop in R corner of mouth  Hearing is normal as tested by gross conversation Palate elevates midline, normal phonation  Shoulder shrug strength is intact  Tongue protrudes midline  Coordination/Cerebellar Finger to Nose: Mildly dysmetric RUE Heel to Shin: Mildly dysmetric RLE Rapid alternating movements: WNL Finger Opposition: More difficult R hand Pronator Drift:  Negative  Bed mobility: Deferred  Transfers: Assistive device utilized: None  Sit to stand: Complete Independence Stand to sit: Complete Independence Chair to chair: Complete  Independence Floor:  Not tested  Curb:  Not tested  Stairs: Level of Assistance: Modified independence Stair Negotiation Technique: Step to Pattern with Bilateral Rails Number of Stairs: 4  Height of Stairs: 6"  Comments: Utilized step-to pattern during descent only;  Gait: Gait pattern: decreased step length- Left and decreased stance time- Right Distance walked: 100 Assistive device utilized: None Level of assistance: Complete Independence Comments: Slight decrease stance time RLE, decreased self-selected speed  Functional Outcome Measures  Results Comments  BERG 49/56   DGI    FGA    TUG 9.5 seconds   5TSTS 11.8 seconds   6 Minute Walk Test    10 Meter Gait Speed    (Blank rows = not tested)   TODAY'S TREATMENT (09/11/23):    SUBJECTIVE: Pt reports that he is doing well today. No significant changes since the last therapy session. No pain reported and no recent falls. No specific questions or concerns.    PAIN: None reported  Ther-ex  NuStep L1-4 x 8 minutes for warm-up and BLE strengthening during interval history, therapist adjusting resistance and monitoring fatigue to ensure proper difficulty (5 minutes unbilled);  Outcome measures 09/11/23: FOTO: 50.7 BERG: 51 : 1050'  Pre vitals: SpO2 99; Pulse 65; BP 137/66 Post vitals: SpO2 97; Pulse 62; BP 182/66   PATIENT EDUCATION:  Education details: Pt educated throughout session about proper posture and technique with exercises. Improved exercise technique, movement at target joints, use of target muscles after min to mod verbal, visual, tactile cues. Balance exercises Person educated: Patient Education method: Explanation Education comprehension: verbalized understanding   HOME EXERCISE PROGRAM:  Access Code:  [email or web address] URL: [email or web address] Date: 07/03/2023 Prepared by: Ria Comment  Exercises - Seated March with Resistance  - 1 x daily - 7 x weekly - 3 sets - 30s hold - Seated Hip Adduction Isometrics with Ball  - 1 x daily - 7 x weekly - 3 sets - 30s hold - Seated Hip Abduction with Resistance  - 1 x daily - 7 x weekly - 3 sets - 30s hold - Seated Long Arc Quad  - 1 x daily - 7 x weekly - 3 sets - 30s hold - Mini Squat with Counter Support  - 1 x daily - 7 x weekly - 3 sets - 30s hold - Heel Raises with Counter Support  - 1 x daily - 7 x weekly - 3 sets - 30s hold   ASSESSMENT:  CLINICAL IMPRESSION:   Pt demonstrates excellent motivation during session today and is ready for discharge. Updated outcome measures in today's session. Pt improved his balance FOTO score to 50.7. Pt scored a 51/56 on the BERG, indicating mild improvement since his initial score but no change since the last update. Pt walked 1050' in the with occasional R foot shuffling to demonstrate clinically significant improvement in cardiopulmonary endurance and community ambulation compared to 882' at initial intake. Updated pt's HEP to incorporate balance progression upon discharge today. Overall, pt has benefited from skilled therapy to address balance deficits and has shown overall improvement since intake. He is ready to be discharged today.  OBJECTIVE IMPAIRMENTS: Abnormal gait, decreased balance, decreased coordination, difficulty walking, and decreased strength.   ACTIVITY LIMITATIONS: carrying, lifting, bending, squatting, stairs, and transfers  PARTICIPATION LIMITATIONS: meal prep, cleaning, shopping, community activity, and yard work  PERSONAL FACTORS: Age and 3+ comorbidities: aphasia, CAD, carotid stenosis, prior CVA, CHF, and COPD  are also affecting patient's functional outcome.   REHAB  POTENTIAL: Good  CLINICAL DECISION MAKING: Unstable/unpredictable  EVALUATION COMPLEXITY:  High   GOALS: Goals reviewed with patient? No  SHORT TERM GOALS: Target date: 08/10/2023  Pt will be independent with HEP in order to improve strength and balance in order to decrease fall risk and improve function at home. Baseline:  Goal status: INITIAL   LONG TERM GOALS: Target date:  Pt will increase FOTO to at least 62 to demonstrate significant improvement in function at home related to balance  Baseline: 50; 07/31/23: 46; 09/11/23: 50.7 Goal status: ONGOING  2.  Pt will improve BERG by at least 3 points in order to demonstrate clinically significant improvement in balance.   Baseline: 06/21/23: 49/56, 07/31/23: 51/56; 09/11/23: 51/56 Goal status: PARTIALLY MET  3. Pt will improve DGI by at least 3 points in order to demonstrate clinically significant improvement in balance and decreased risk for falls.     Baseline: 14/24, 07/31/23: 17/24; Goal status: ACHIEVED  4. Pt will increase by at least 44m (122ft) in order to demonstrate clinically significant improvement in cardiopulmonary endurance and community ambulation   Baseline: 86' without assistive device. Occasional R foot shuffling, 07/31/23: 940' without assistive device, occasional R foot shuffling; 09/11/23: 1050' without AD, occasional R foot shuffling Goal status: ACHIEVED   PLAN: PT FREQUENCY: 2x/week  PT DURATION: 8 weeks  PLANNED INTERVENTIONS: Therapeutic exercises, Therapeutic activity, Neuromuscular re-education, Balance training, Gait training, Patient/Family education, Self Care, Joint mobilization, Joint manipulation, Vestibular training, Canalith repositioning, Orthotic/Fit training, DME instructions, Dry Needling, Electrical stimulation, Spinal manipulation, Spinal mobilization, Cryotherapy, Moist heat, Taping, Traction, Ultrasound, Ionotophoresis 4mg /ml Dexamethasone, Manual therapy, and Re-evaluation.  PLAN FOR NEXT SESSION: Discharge  Sherri Sear, SPT [facility] DPTE   Lynnea Maizes  PT, DPT, GCS  Huprich,Jason, PT, DPT  09/11/2023, 4:46 PM

## 2023-09-13 ENCOUNTER — Other Ambulatory Visit (INDEPENDENT_AMBULATORY_CARE_PROVIDER_SITE_OTHER): Payer: Self-pay | Admitting: Nurse Practitioner

## 2023-09-13 ENCOUNTER — Ambulatory Visit: Payer: Medicare HMO

## 2023-09-24 DIAGNOSIS — E039 Hypothyroidism, unspecified: Secondary | ICD-10-CM | POA: Diagnosis not present

## 2023-09-24 DIAGNOSIS — I5020 Unspecified systolic (congestive) heart failure: Secondary | ICD-10-CM | POA: Diagnosis not present

## 2023-09-24 DIAGNOSIS — E1151 Type 2 diabetes mellitus with diabetic peripheral angiopathy without gangrene: Secondary | ICD-10-CM | POA: Diagnosis not present

## 2023-09-24 DIAGNOSIS — E782 Mixed hyperlipidemia: Secondary | ICD-10-CM | POA: Diagnosis not present

## 2023-09-24 DIAGNOSIS — Z87891 Personal history of nicotine dependence: Secondary | ICD-10-CM | POA: Diagnosis not present

## 2023-09-24 DIAGNOSIS — I110 Hypertensive heart disease with heart failure: Secondary | ICD-10-CM | POA: Diagnosis not present

## 2023-09-24 DIAGNOSIS — F418 Other specified anxiety disorders: Secondary | ICD-10-CM | POA: Diagnosis not present

## 2023-09-24 DIAGNOSIS — Z79899 Other long term (current) drug therapy: Secondary | ICD-10-CM | POA: Diagnosis not present

## 2023-09-24 DIAGNOSIS — I69319 Unspecified symptoms and signs involving cognitive functions following cerebral infarction: Secondary | ICD-10-CM | POA: Diagnosis not present

## 2023-09-25 ENCOUNTER — Telehealth: Payer: Self-pay | Admitting: Gastroenterology

## 2023-09-25 NOTE — Telephone Encounter (Signed)
 The patient wife Paul Bass) called into schedule her husband appointment because he is having Difficulty Swallowing and if he have to have a procedure done they want it done by Dr. Servando Snare.

## 2023-09-26 DIAGNOSIS — L2089 Other atopic dermatitis: Secondary | ICD-10-CM | POA: Diagnosis not present

## 2023-10-01 DIAGNOSIS — J449 Chronic obstructive pulmonary disease, unspecified: Secondary | ICD-10-CM | POA: Insufficient documentation

## 2023-10-01 DIAGNOSIS — I2510 Atherosclerotic heart disease of native coronary artery without angina pectoris: Secondary | ICD-10-CM

## 2023-10-01 DIAGNOSIS — E119 Type 2 diabetes mellitus without complications: Secondary | ICD-10-CM | POA: Insufficient documentation

## 2023-10-01 HISTORY — DX: Type 2 diabetes mellitus without complications: E11.9

## 2023-10-01 HISTORY — DX: Chronic obstructive pulmonary disease, unspecified: J44.9

## 2023-10-01 HISTORY — DX: Atherosclerotic heart disease of native coronary artery without angina pectoris: I25.10

## 2023-10-01 NOTE — Progress Notes (Unsigned)
 Celso Amy, PA-C [address]  [address]  [place], [place] [address]  Main: [phone]  Fax: [phone]   Primary Care Physician: Myrene Buddy, NP  Primary Gastroenterologist:  Celso Amy, PA-C / Dr. Midge Minium    CC:  Dysphagia, history of esophageal stricture.  HPI: Paul Bass is a 78 y.o. male presents to evaluate dysphagia.  Patient states he started having difficulty swallowing capsules and large pills over a year ago.  For the past year he has felt like food is getting stuck in his throat.  He is having to cough the food back up.  He is not having any vomiting episodes.  He does not have any difficulty drinking liquids.  No pneumonia episodes.  No food bolus episodes.  Currently takes pantoprazole 40 Mg once daily for chronic GERD.  Denies breakthrough heartburn.  History of GERD for many years.  Last EGD by Dr. Servando Snare 10/2017 showed 1 benign appearing moderate distal esophageal stricture at the GE junction dilated with TTS dilator to 18 mm. Small hiatal hernia.  Normal stomach and duodenum.  He has history of adenomatous colon polyps.  Last colonoscopy 11/2021 showed 7 small tubular adenoma polyps removed.  Excellent prep.  Pandiverticulosis.  No repeat colonoscopies were recommended due to advanced age.  Current Outpatient Medications  Medication Sig Dispense Refill   albuterol (VENTOLIN HFA) 108 (90 Base) MCG/ACT inhaler SMARTSIG:2 inhalation Via Inhaler Every 4 Hours PRN     ASPIRIN LOW DOSE 81 MG tablet TAKE 1 TABLET (81 MG TOTAL) BY MOUTH DAILY AT 6 (SIX) AM. SWALLOW WHOLE. 90 tablet 1   atorvastatin (LIPITOR) 80 MG tablet Take 80 mg by mouth daily.      buPROPion (WELLBUTRIN XL) 150 MG 24 hr tablet Take 150 mg by mouth at bedtime.     cholecalciferol (VITAMIN D3) 25 MCG (1000 UT) tablet Take 1,000 Units by mouth every evening.     clonazePAM (KLONOPIN) 0.5 MG tablet Take 0.5 mg by mouth 2 (two) times daily.     clopidogrel (PLAVIX) 75 MG tablet Take  75 mg by mouth daily.   1   Coenzyme Q10 (COQ10) 100 MG CAPS Take 100 mg by mouth at bedtime.     finasteride (PROSCAR) 5 MG tablet Take 5 mg by mouth daily.     fluticasone (FLONASE) 50 MCG/ACT nasal spray Place 2 sprays into both nostrils daily.     fluticasone-salmeterol (WIXELA INHUB) 500-50 MCG/ACT AEPB Inhale 2 puffs into the lungs in the morning and at bedtime.     gabapentin (NEURONTIN) 300 MG capsule Take 300 mg by mouth daily.     glucose blood test strip      levothyroxine (SYNTHROID, LEVOTHROID) 100 MCG tablet Take 100 mcg by mouth daily before breakfast.     metFORMIN (GLUCOPHAGE) 500 MG tablet Take 500 mg by mouth 2 (two) times daily.      metoprolol tartrate (LOPRESSOR) 25 MG tablet Take 12.5 mg by mouth 2 (two) times daily.     Multiple Vitamin (MULTIVITAMIN WITH MINERALS) TABS Take 1 tablet by mouth daily.     pantoprazole (PROTONIX) 40 MG tablet TAKE 1 TABLET BY MOUTH EVERYDAY AT BEDTIME 90 tablet 3   psyllium (METAMUCIL SMOOTH TEXTURE) 28 % packet Take 1 packet by mouth daily.     RA KRILL OIL 500 MG CAPS Take 500 mg by mouth at bedtime.      spironolactone (ALDACTONE) 25 MG tablet Take 12.5 mg by mouth  daily.     tamsulosin (FLOMAX) 0.4 MG CAPS capsule TAKE 1 CAPSULE BY MOUTH EVERY DAY 90 capsule 1   triamcinolone cream (KENALOG) 0.1 % Apply 1 Application topically 2 (two) times daily.     venlafaxine XR (EFFEXOR-XR) 150 MG 24 hr capsule Take 150 mg by mouth daily.     No current facility-administered medications for this visit.    Allergies as of 10/02/2023   (No Known Allergies)    Past Medical History:  Diagnosis Date   Accelerated junctional rhythm    Anemia    Anxiety    a.) Tx'd with BZO PRN   Arthritis    Bilateral carotid artery disease ([name])    BPH (benign prostatic hypertrophy)    CAD (coronary artery disease) 10/01/2023   CAD S/P percutaneous coronary angioplasty    a.) PCI in 2002 placing a RCA stent (unknown type). b.) LHC 12/07/2015: 5% ISR  m-dRCA, 90% dRCA, 25% pRCA, 25% p-mLCx, 100% OM3, 95% oOM2-OM2, 75% m-dLAD; refer to CVTS. c.) 3v CABG 01/03/2016   CHF (congestive heart failure) ([name])    a.) TTE 05/12/2012: EF 50-55%; mild LA enlargement; G1DD. b.)  TTE 11/26/2015: EF 45%; mild BAE; triv PR, mild MR/TR; inferolateral HK; G1DD. c.)  TTE 11/07/2017: EF 35%; RV enlargement; BAE; inferior and inferolateral HK; triv PR; mild MR/TR. d.)  TTE 09/17/2019: EF 35%; mild LVH; triv MR/TR. e.)  TTE 08/11/2021: EF 50%; triv MR/TR; G1DD.   Chronic obstructive pulmonary disease ([name]) 10/01/2023   Cognitive deficit following cerebrovascular accident (CVA)    COPD (chronic obstructive pulmonary disease) ([name])    Cortical cataract    Depression    Diabetes mellitus ([name]) 10/01/2023   Dyspnea    GERD (gastroesophageal reflux disease)    History of 2019 novel coronavirus disease (COVID-19) 01/19/2021   History of acute inferior wall MI 2002   a.) PCI was performed placing a RCA stent (unknown type)   History of kidney stones    HTN (hypertension)    Hyperlipidemia    Hypothyroidism    Long term current use of antithrombotics/antiplatelets    a.) DAPT therapy (ASA + clopidogrel)   OSA (obstructive sleep apnea)    a.) does not require nocturnal PAP therapy   PLMD (periodic limb movement disorder)    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 b/) residual RIGHT sided weakness   T2DM (type 2 diabetes mellitus) ([name])    Tobacco abuse     Past Surgical History:  Procedure Laterality Date   CARDIAC CATHETERIZATION  N/A 12/07/2015   Procedure: Left Heart Cath and Coronary Angiography;  Surgeon: Lamar Blinks, MD;  Location: [facility]IVE CV [facility];  Service: Cardiovascular;  Laterality: N/A;   CAROTID ANGIOGRAPHY N/A 06/05/2022   Procedure: CAROTID ANGIOGRAPHY;  Surgeon: Annice Needy, MD;  Location: [facility];  Service: Cardiovascular;  Laterality: N/A;   CAROTID PTA/STENT INTERVENTION Left    HX: 2 stents   CATARACT EXTRACTION W/PHACO Left 03/24/2021   Procedure: CATARACT EXTRACTION PHACO AND INTRAOCULAR LENS PLACEMENT (IOC) LEFT DIABETIC 6.67  00:42.5;  Surgeon: Galen Manila, MD;  Location: [facility] ORS;  Service: Ophthalmology;  Laterality: Left;   COLONOSCOPY WITH PROPOFOL N/A 02/18/2019   Procedure: COLONOSCOPY WITH PROPOFOL;  Surgeon: Midge Minium, MD;  Location: [facility] ENDOSCOPY;  Service: Endoscopy;  Laterality: N/A;   COLONOSCOPY WITH PROPOFOL N/A 12/22/2021   Procedure: COLONOSCOPY WITH PROPOFOL;  Surgeon: Midge Minium, MD;  Location: [facility] ENDOSCOPY;  Service: Endoscopy;  Laterality: N/A;   CORONARY ANGIOPLASTY WITH STENT PLACEMENT Left 2002   CORONARY ARTERY BYPASS GRAFT N/A 01/03/2016   Procedure: 3v CORONARY ARTERY BYPASS GRAFT (LIMA-LAD, SVG-PDA, SVG-OM2); Location: [facility]; Surgeon: Cain Sieve, MD   CYSTOSCOPY W/ URETERAL STENT REMOVAL N/A 10/04/2021   Procedure: CYSTOSCOPY WITH PROSTATE FOREIGN BODY REMOVAL;  Surgeon: Riki Altes, MD;  Location: [facility] ORS;  Service: Urology;  Laterality: N/A;   CYSTOSCOPY WITH INSERTION OF UROLIFT N/A 04/29/2019   Procedure: CYSTOSCOPY WITH INSERTION OF UROLIFT;  Surgeon: Riki Altes, MD;  Location: [facility] ORS;  Service: Urology;  Laterality: N/A;   ESOPHAGOGASTRODUODENOSCOPY (EGD) WITH PROPOFOL N/A 09/11/2016   Procedure: ESOPHAGOGASTRODUODENOSCOPY (EGD) WITH PROPOFOL;  Surgeon: Midge Minium, MD;  Location: [facility] ENDOSCOPY;  Service: Endoscopy;  Laterality: N/A;   ESOPHAGOGASTRODUODENOSCOPY (EGD) WITH PROPOFOL N/A 10/17/2016   Procedure:  ESOPHAGOGASTRODUODENOSCOPY (EGD) WITH PROPOFOL;  Surgeon: Midge Minium, MD;  Location: [facility] ENDOSCOPY;  Service: Endoscopy;  Laterality: N/A;   ESOPHAGOGASTRODUODENOSCOPY (EGD) WITH PROPOFOL N/A 11/13/2017   Procedure: ESOPHAGOGASTRODUODENOSCOPY (EGD) WITH PROPOFOL;  Surgeon: Midge Minium, MD;  Location: [facility] ENDOSCOPY;  Service: Endoscopy;  Laterality: N/A;   pins R lower leg Left    rotator cuff replaced Right    SAVORY DILATION N/A 10/17/2016   Procedure: SAVORY DILATION;  Surgeon: Midge Minium, MD;  Location: [facility] ENDOSCOPY;  Service: Endoscopy;  Laterality: N/A;   TRANSURETHRAL INCISION OF PROSTATE N/A 10/04/2021   Procedure: TRANSURETHRAL INCISION OF THE PROSTATE (TUIP);  Surgeon: Riki Altes, MD;  Location: [facility] ORS;  Service: Urology;  Laterality: N/A;    Review of Systems:    All systems reviewed and negative except where noted in HPI.   Physical Examination:   BP 137/72   Pulse 79   Temp 98.7 F (37.1 C)   Ht 5\' 11"  (1.803 m)   Wt 212 lb 9.6 oz (96.4 kg)   BMI 29.65 kg/m   General: Well-nourished, well-developed in no acute distress.  Lungs: Clear to auscultation bilaterally. Non-labored. Heart: Regular rate and rhythm, no murmurs rubs or gallops.  Abdomen: Bowel sounds are normal; Abdomen is Soft; No hepatosplenomegaly, masses or hernias;  No Abdominal Tenderness; No guarding or rebound tenderness. Neuro: Alert and oriented x 3.  Grossly intact.  Psych: Alert and cooperative, normal mood and affect.   Imaging Studies: No results found.  Assessment and Plan:   SIMON LLAMAS is a 78 y.o. y/o male presents for:  Dysphagia  Scheduling barium Swallow With Tablet to evaluate for esophageal stricture.  Pending results, then decide if EGD w/ Dilation is needed.  Patient declined to schedule EGD today (due to age and comorbidities).  If barium swallow tablet shows esophageal stricture, then we will go ahead and schedule EGD with dilation.  He will need permission to  hold Plavix 5 days prior to EGD from his cardiologist.  Patient expressed understanding and agrees with this plan.  GERD  Continue Pantoprazole 40mg  daily.  Continue to avoid GERD trigger foods and drinks.  Celso Amy, PA-C  Follow up based on barium swallow test results and symptoms.

## 2023-10-02 ENCOUNTER — Encounter: Payer: Self-pay | Admitting: Physician Assistant

## 2023-10-02 ENCOUNTER — Ambulatory Visit: Payer: Medicare HMO | Admitting: Physician Assistant

## 2023-10-02 VITALS — BP 137/72 | HR 79 | Temp 98.7°F | Ht 71.0 in | Wt 212.6 lb

## 2023-10-02 DIAGNOSIS — R1310 Dysphagia, unspecified: Secondary | ICD-10-CM | POA: Diagnosis not present

## 2023-10-02 DIAGNOSIS — K219 Gastro-esophageal reflux disease without esophagitis: Secondary | ICD-10-CM

## 2023-10-02 NOTE — Patient Instructions (Signed)
 Barium Swallow scheduled @ [facility]  Medical mall entrance-check in at desk-----arrive at 10:45 am -nothing to eat/drink 3 hours prior to your test.  Follow up as needed.

## 2023-10-08 ENCOUNTER — Other Ambulatory Visit: Payer: Self-pay | Admitting: Physician Assistant

## 2023-10-08 ENCOUNTER — Other Ambulatory Visit: Payer: Self-pay

## 2023-10-08 ENCOUNTER — Telehealth: Payer: Self-pay

## 2023-10-08 ENCOUNTER — Ambulatory Visit
Admission: RE | Admit: 2023-10-08 | Discharge: 2023-10-08 | Disposition: A | Discharge status: Home / Self Care | Source: Ambulatory Visit | Attending: Physician Assistant | Admitting: Physician Assistant

## 2023-10-08 DIAGNOSIS — R1310 Dysphagia, unspecified: Secondary | ICD-10-CM | POA: Diagnosis not present

## 2023-10-08 DIAGNOSIS — K219 Gastro-esophageal reflux disease without esophagitis: Secondary | ICD-10-CM | POA: Diagnosis not present

## 2023-10-08 DIAGNOSIS — K449 Diaphragmatic hernia without obstruction or gangrene: Secondary | ICD-10-CM | POA: Diagnosis not present

## 2023-10-08 DIAGNOSIS — K224 Dyskinesia of esophagus: Secondary | ICD-10-CM | POA: Diagnosis not present

## 2023-10-08 NOTE — Telephone Encounter (Signed)
 Call and notify patient barium swallow test shows:  1.  Narrow irregular appearance of the lower esophagus.  Concerning for distal esophageal stricture.  Please schedule EGD with Dr. Servando Snare first available. **Patient needs permission to hold Plavix 5 days prior to EGD from his cardiologist.  2.  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  3.  Small to medium hiatal hernia.  Continue pantoprazole 40 Mg once daily for acid reflux.  Celso Amy, PA-C     Call and notify patient barium swallow test shows:  1.  Narrow irregular appearance of the lower esophagus.  Concerning for distal esophageal stricture.  Please schedule EGD with Dr. Servando Snare first available. **Patient needs permission to hold Plavix 5 days prior to EGD from his cardiologist.  2.  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  3.  Small to medium hiatal hernia.  Continue pantoprazole 40 Mg once daily for acid reflux.  Celso Amy, PA-C

## 2023-10-08 NOTE — Telephone Encounter (Signed)
 Received call from Radiology regarding results of Barium Swallow- Please see report and advise.

## 2023-10-08 NOTE — Progress Notes (Signed)
 Call and notify patient barium swallow test shows: 1.  Narrow irregular appearance of the lower esophagus.  Concerning for distal esophageal stricture.  Please schedule EGD with Dr. Servando Snare first available. **Patient needs permission to hold Plavix 5 days prior to EGD from his cardiologist. 2.  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 3.  Small to medium hiatal hernia.  Continue pantoprazole 40 Mg once daily for acid reflux. Celso Amy, PA-C

## 2023-10-09 ENCOUNTER — Telehealth: Payer: Self-pay

## 2023-10-09 NOTE — Telephone Encounter (Signed)
 Received Plavix clearance from Dr.Callwood to hold plavix and aspirin 5 days prior and restart 2 days after.   Left detailed message on wife's VM to hold Plavix/Aspirin 5 days prior to procedure and restart 2 days after procedure.

## 2023-10-17 ENCOUNTER — Encounter
Admission: RE | Disposition: A | Discharge status: Home / Self Care | Payer: Self-pay | Source: Home / Self Care | Attending: Gastroenterology

## 2023-10-17 ENCOUNTER — Ambulatory Visit: Admitting: Anesthesiology

## 2023-10-17 ENCOUNTER — Ambulatory Visit
Admission: RE | Admit: 2023-10-17 | Discharge: 2023-10-17 | Disposition: A | Discharge status: Home / Self Care | Attending: Gastroenterology | Admitting: Gastroenterology

## 2023-10-17 ENCOUNTER — Encounter: Payer: Self-pay | Admitting: Gastroenterology

## 2023-10-17 DIAGNOSIS — Z951 Presence of aortocoronary bypass graft: Secondary | ICD-10-CM | POA: Diagnosis not present

## 2023-10-17 DIAGNOSIS — F32A Depression, unspecified: Secondary | ICD-10-CM | POA: Insufficient documentation

## 2023-10-17 DIAGNOSIS — Z7951 Long term (current) use of inhaled steroids: Secondary | ICD-10-CM | POA: Diagnosis not present

## 2023-10-17 DIAGNOSIS — F419 Anxiety disorder, unspecified: Secondary | ICD-10-CM | POA: Diagnosis not present

## 2023-10-17 DIAGNOSIS — E1151 Type 2 diabetes mellitus with diabetic peripheral angiopathy without gangrene: Secondary | ICD-10-CM | POA: Insufficient documentation

## 2023-10-17 DIAGNOSIS — Z87891 Personal history of nicotine dependence: Secondary | ICD-10-CM | POA: Insufficient documentation

## 2023-10-17 DIAGNOSIS — N400 Enlarged prostate without lower urinary tract symptoms: Secondary | ICD-10-CM | POA: Insufficient documentation

## 2023-10-17 DIAGNOSIS — K222 Esophageal obstruction: Secondary | ICD-10-CM | POA: Insufficient documentation

## 2023-10-17 DIAGNOSIS — G4733 Obstructive sleep apnea (adult) (pediatric): Secondary | ICD-10-CM | POA: Insufficient documentation

## 2023-10-17 DIAGNOSIS — I509 Heart failure, unspecified: Secondary | ICD-10-CM | POA: Diagnosis not present

## 2023-10-17 DIAGNOSIS — R0602 Shortness of breath: Secondary | ICD-10-CM | POA: Diagnosis not present

## 2023-10-17 DIAGNOSIS — K2289 Other specified disease of esophagus: Secondary | ICD-10-CM | POA: Diagnosis not present

## 2023-10-17 DIAGNOSIS — I2510 Atherosclerotic heart disease of native coronary artery without angina pectoris: Secondary | ICD-10-CM | POA: Insufficient documentation

## 2023-10-17 DIAGNOSIS — Z7984 Long term (current) use of oral hypoglycemic drugs: Secondary | ICD-10-CM | POA: Insufficient documentation

## 2023-10-17 DIAGNOSIS — E785 Hyperlipidemia, unspecified: Secondary | ICD-10-CM | POA: Insufficient documentation

## 2023-10-17 DIAGNOSIS — Z79899 Other long term (current) drug therapy: Secondary | ICD-10-CM | POA: Diagnosis not present

## 2023-10-17 DIAGNOSIS — J449 Chronic obstructive pulmonary disease, unspecified: Secondary | ICD-10-CM | POA: Insufficient documentation

## 2023-10-17 DIAGNOSIS — R1310 Dysphagia, unspecified: Secondary | ICD-10-CM | POA: Insufficient documentation

## 2023-10-17 DIAGNOSIS — I5022 Chronic systolic (congestive) heart failure: Secondary | ICD-10-CM | POA: Diagnosis not present

## 2023-10-17 DIAGNOSIS — I4891 Unspecified atrial fibrillation: Secondary | ICD-10-CM | POA: Diagnosis not present

## 2023-10-17 DIAGNOSIS — Z955 Presence of coronary angioplasty implant and graft: Secondary | ICD-10-CM | POA: Diagnosis not present

## 2023-10-17 DIAGNOSIS — E039 Hypothyroidism, unspecified: Secondary | ICD-10-CM | POA: Diagnosis not present

## 2023-10-17 DIAGNOSIS — G4730 Sleep apnea, unspecified: Secondary | ICD-10-CM | POA: Diagnosis not present

## 2023-10-17 DIAGNOSIS — K219 Gastro-esophageal reflux disease without esophagitis: Secondary | ICD-10-CM | POA: Diagnosis not present

## 2023-10-17 DIAGNOSIS — Z79890 Hormone replacement therapy (postmenopausal): Secondary | ICD-10-CM | POA: Insufficient documentation

## 2023-10-17 DIAGNOSIS — Z7982 Long term (current) use of aspirin: Secondary | ICD-10-CM | POA: Diagnosis not present

## 2023-10-17 DIAGNOSIS — I110 Hypertensive heart disease with heart failure: Secondary | ICD-10-CM | POA: Insufficient documentation

## 2023-10-17 DIAGNOSIS — Z833 Family history of diabetes mellitus: Secondary | ICD-10-CM | POA: Insufficient documentation

## 2023-10-17 DIAGNOSIS — Z7902 Long term (current) use of antithrombotics/antiplatelets: Secondary | ICD-10-CM | POA: Insufficient documentation

## 2023-10-17 HISTORY — PX: ESOPHAGOGASTRODUODENOSCOPY: SHX5428

## 2023-10-17 HISTORY — PX: ESOPHAGEAL DILATION: SHX303

## 2023-10-17 LAB — GLUCOSE, CAPILLARY: Glucose-Capillary: 163 mg/dL — ABNORMAL HIGH (ref 70–99)

## 2023-10-17 SURGERY — EGD (ESOPHAGOGASTRODUODENOSCOPY)
Anesthesia: General

## 2023-10-17 MED ORDER — LIDOCAINE 2% (20 MG/ML) 5 ML SYRINGE
INTRAMUSCULAR | Status: DC | PRN
  Administered 2023-10-17: 20 mg via INTRAVENOUS

## 2023-10-17 MED ORDER — PROPOFOL 10 MG/ML IV BOLUS
INTRAVENOUS | Status: DC | PRN
  Administered 2023-10-17: 70 mg via INTRAVENOUS

## 2023-10-17 MED ORDER — SODIUM CHLORIDE 0.9 % IV SOLN: INTRAVENOUS | Status: DC

## 2023-10-17 MED ORDER — PROPOFOL 500 MG/50ML IV EMUL
INTRAVENOUS | Status: DC | PRN
  Administered 2023-10-17: 120 ug/kg/min via INTRAVENOUS

## 2023-10-17 MED ORDER — GLYCOPYRROLATE 0.2 MG/ML IJ SOLN
INTRAMUSCULAR | Status: DC | PRN
  Administered 2023-10-17: .2 mg via INTRAVENOUS

## 2023-10-17 NOTE — Op Note (Signed)
 [facility] Gastroenterology Patient Name: Paul Bass Procedure Date: 10/17/2023 8:00 AM MRN: [id number] Account #: [id number] Date of Birth: 03-16-1946 Admit Type: Outpatient Age: 78 Room: [facility] ENDO ROOM 1 Gender: Male Note Status: Finalized Instrument Name: Upper Endoscope [id number] Procedure:             Upper GI endoscopy Indications:           Dysphagia Providers:             Midge Minium MD, MD Referring MD:          Caryl Asp (Referring MD) Medicines:             Propofol per Anesthesia Complications:         No immediate complications. Procedure:             Pre-Anesthesia Assessment:                        - Prior to the procedure, a History and Physical was                         performed, and patient medications and allergies were                         reviewed. The patient's tolerance of previous                         anesthesia was also reviewed. The risks and benefits                         of the procedure and the sedation options and risks                         were discussed with the patient. All questions were                         answered, and informed consent was obtained. Prior                         Anticoagulants: The patient has taken no anticoagulant                         or antiplatelet agents. ASA Grade Assessment: II - A                         patient with mild systemic disease. After reviewing                         the risks and benefits, the patient was deemed in                         satisfactory condition to undergo the procedure.                        After obtaining informed consent, the endoscope was                         passed under direct vision. Throughout the procedure,  the patient's blood pressure, pulse, and oxygen                         saturations were monitored continuously. The Endoscope                         was introduced through the mouth, and advanced to the                          second part of duodenum. The upper GI endoscopy was                         accomplished without difficulty. The patient tolerated                         the procedure well. Findings:      One benign-appearing, intrinsic moderate stenosis was found at the       gastroesophageal junction. The stenosis was traversed. A TTS dilator was       passed through the scope. Dilation with a 12-13.5-15 mm balloon dilator       was performed to 15 mm. The dilation site was examined following       endoscope reinsertion and showed moderate improvement in luminal       narrowing.      The stomach was normal.      The examined duodenum was normal. Impression:            - Benign-appearing esophageal stenosis. Dilated.                        - Normal stomach.                        - Normal examined duodenum.                        - No specimens collected. Recommendation:        - Discharge patient to home.                        - Resume previous diet.                        - Continue present medications.                        - Repeat upper endoscopy in 3 weeks for retreatment. Procedure Code(s):     --- Professional ---                        [phone], Esophagogastroduodenoscopy, flexible,                         transoral; with transendoscopic balloon dilation of                         esophagus (less than 30 mm diameter) Diagnosis Code(s):     --- Professional ---                        R13.10, Dysphagia, unspecified  K22.2, Esophageal obstruction CPT copyright 2022 [employer]. All rights reserved. The codes documented in this report are preliminary and upon coder review may  be revised to meet current compliance requirements. Midge Minium MD, MD 10/17/2023 8:19:55 AM This report has been signed electronically. Number of Addenda: 0 Note Initiated On: 10/17/2023 8:00 AM Estimated Blood Loss:  Estimated blood loss: none.      [facility]

## 2023-10-17 NOTE — Transfer of Care (Signed)
 Immediate Anesthesia Transfer of Care Note  Patient: Paul Bass  Procedure(s) Performed: EGD (ESOPHAGOGASTRODUODENOSCOPY) DILATION, ESOPHAGUS  Patient Location: Endoscopy Unit  Anesthesia Type:General  Level of Consciousness: drowsy  Airway & Oxygen Therapy: Patient Spontanous Breathing  Post-op Assessment: Report given to RN and Post -op Vital signs reviewed and stable  Post vital signs: Reviewed  Last Vitals:  Vitals Value Taken Time  BP 118/59 10/17/23 0824  Temp 35.9 C 10/17/23 0823  Pulse 57 10/17/23 0824  Resp 17 10/17/23 0824  SpO2 98 % 10/17/23 0824  Vitals shown include unfiled device data.  Last Pain:  Vitals:   10/17/23 0823  TempSrc: Tympanic  PainSc: Asleep         Complications: No notable events documented.

## 2023-10-17 NOTE — H&P (Signed)
 Midge Minium, MD [facility] [address] [place], [place] [address] Phone:[phone] Fax : [phone]  Primary Care Physician:  Myrene Buddy, NP Primary Gastroenterologist:  Dr. Servando Snare  Pre-Procedure History & Physical: HPI:  Paul Bass is a 78 y.o. male is here for an endoscopy.   Past Medical History:  Diagnosis Date   Accelerated junctional rhythm    Anemia    Anxiety    a.) Tx'd with BZO PRN   Arthritis    Bilateral carotid artery disease ([name])    BPH (benign prostatic hypertrophy)    CAD (coronary artery disease) 10/01/2023   CAD S/P percutaneous coronary angioplasty    a.) PCI in 2002 placing a RCA stent (unknown type). b.) LHC 12/07/2015: 5% ISR m-dRCA, 90% dRCA, 25% pRCA, 25% p-mLCx, 100% OM3, 95% oOM2-OM2, 75% m-dLAD; refer to CVTS. c.) 3v CABG 01/03/2016   CHF (congestive heart failure) ([name])    a.) TTE 05/12/2012: EF 50-55%; mild LA enlargement; G1DD. b.)  TTE 11/26/2015: EF 45%; mild BAE; triv PR, mild MR/TR; inferolateral HK; G1DD. c.)  TTE 11/07/2017: EF 35%; RV enlargement; BAE; inferior and inferolateral HK; triv PR; mild MR/TR. d.)  TTE 09/17/2019: EF 35%; mild LVH; triv MR/TR. e.)  TTE 08/11/2021: EF 50%; triv MR/TR; G1DD.   Chronic obstructive pulmonary disease ([name]) 10/01/2023   Cognitive deficit following cerebrovascular accident (CVA)    COPD (chronic obstructive pulmonary disease) ([name])    Cortical cataract    Depression    Diabetes mellitus ([name]) 10/01/2023   Dyspnea    GERD (gastroesophageal reflux disease)    History of 2019 novel coronavirus disease (COVID-19) 01/19/2021   History of acute inferior wall MI 2002   a.) PCI was performed placing a RCA stent (unknown type)   History of kidney stones    HTN (hypertension)    Hyperlipidemia    Hypothyroidism    Long term current use of antithrombotics/antiplatelets    a.) DAPT therapy (ASA + clopidogrel)   OSA (obstructive sleep apnea)    a.) does not require nocturnal PAP therapy    PLMD (periodic limb movement disorder)    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 b/) residual RIGHT sided weakness   T2DM (type 2 diabetes mellitus) ([name])    Tobacco abuse     Past Surgical History:  Procedure Laterality Date   CARDIAC CATHETERIZATION N/A 12/07/2015   Procedure: Left Heart Cath and Coronary Angiography;  Surgeon: Lamar Blinks, MD;  Location: [facility] CV [facility];  Service: Cardiovascular;  Laterality: N/A;   CAROTID ANGIOGRAPHY N/A 06/05/2022   Procedure: CAROTID ANGIOGRAPHY;  Surgeon: Annice Needy, MD;  Location: [facility];  Service: Cardiovascular;  Laterality: N/A;   CAROTID PTA/STENT INTERVENTION Left    HX: 2 stents   CATARACT EXTRACTION W/PHACO Left 03/24/2021   Procedure: CATARACT EXTRACTION PHACO AND INTRAOCULAR LENS PLACEMENT (IOC) LEFT DIABETIC 6.67 00:42.5;  Surgeon: Galen Manila, MD;  Location: [facility] ORS;  Service: Ophthalmology;  Laterality: Left;   COLONOSCOPY WITH PROPOFOL N/A 02/18/2019   Procedure: COLONOSCOPY WITH  PROPOFOL;  Surgeon: Midge Minium, MD;  Location: [facility] ENDOSCOPY;  Service: Endoscopy;  Laterality: N/A;   COLONOSCOPY WITH PROPOFOL N/A 12/22/2021   Procedure: COLONOSCOPY WITH PROPOFOL;  Surgeon: Midge Minium, MD;  Location: [facility] ENDOSCOPY;  Service: Endoscopy;  Laterality: N/A;   CORONARY ANGIOPLASTY WITH STENT PLACEMENT Left 2002   CORONARY ARTERY BYPASS GRAFT N/A 01/03/2016   Procedure: 3v CORONARY ARTERY BYPASS GRAFT (LIMA-LAD, SVG-PDA, SVG-OM2); Location: [facility]; Surgeon: Cain Sieve, MD    CYSTOSCOPY W/ URETERAL STENT REMOVAL N/A 10/04/2021   Procedure: CYSTOSCOPY WITH PROSTATE FOREIGN BODY REMOVAL;  Surgeon: Riki Altes, MD;  Location: [facility] ORS;  Service: Urology;  Laterality: N/A;   CYSTOSCOPY WITH INSERTION OF UROLIFT N/A 04/29/2019   Procedure: CYSTOSCOPY WITH INSERTION OF UROLIFT;  Surgeon: Riki Altes, MD;  Location: [facility] ORS;  Service: Urology;  Laterality: N/A;   ESOPHAGOGASTRODUODENOSCOPY (EGD) WITH PROPOFOL N/A 09/11/2016   Procedure: ESOPHAGOGASTRODUODENOSCOPY (EGD) WITH PROPOFOL;  Surgeon: Midge Minium, MD;  Location: [facility] ENDOSCOPY;  Service: Endoscopy;  Laterality: N/A;   ESOPHAGOGASTRODUODENOSCOPY (EGD) WITH PROPOFOL N/A 10/17/2016   Procedure: ESOPHAGOGASTRODUODENOSCOPY (EGD) WITH PROPOFOL;  Surgeon: Midge Minium, MD;  Location: [facility] ENDOSCOPY;  Service: Endoscopy;  Laterality: N/A;   ESOPHAGOGASTRODUODENOSCOPY (EGD) WITH PROPOFOL N/A 11/13/2017   Procedure: ESOPHAGOGASTRODUODENOSCOPY (EGD) WITH PROPOFOL;  Surgeon: Midge Minium, MD;  Location: [facility] ENDOSCOPY;  Service: Endoscopy;  Laterality: N/A;   pins R lower leg Left    rotator cuff replaced Right    SAVORY DILATION N/A 10/17/2016   Procedure: SAVORY DILATION;  Surgeon: Midge Minium, MD;  Location: [facility] ENDOSCOPY;  Service: Endoscopy;  Laterality: N/A;   TRANSURETHRAL INCISION OF PROSTATE N/A 10/04/2021   Procedure: TRANSURETHRAL INCISION OF THE PROSTATE (TUIP);  Surgeon: Riki Altes, MD;  Location: [facility] OR[facility];  Service: Urology;  Laterality: N/A;    Prior to Admission medications   Medication Sig Start Date End Date Taking? Authorizing Provider  atorvastatin (LIPITOR) 80 MG tablet Take 80 mg by mouth daily.  01/09/16  Yes [provider]  buPROPion (WELLBUTRIN XL) 150 MG 24 hr tablet Take 150 mg by mouth at bedtime. 04/07/20  Yes [provider]  clonazePAM (KLONOPIN) 0.5 MG tablet Take 0.5 mg by mouth 2 (two) times daily.   Yes [provider]  finasteride (PROSCAR) 5 MG  tablet Take 5 mg by mouth daily.   Yes [provider]  gabapentin (NEURONTIN) 300 MG capsule Take 300 mg by mouth daily.   Yes [provider]  levothyroxine (SYNTHROID, LEVOTHROID) 100 MCG tablet Take 100 mcg by mouth daily before breakfast.   Yes [provider]  metFORMIN (GLUCOPHAGE) 500 MG tablet Take 500 mg by mouth 2 (two) times daily.  09/07/14  Yes [provider]  metoprolol tartrate (LOPRESSOR) 25 MG tablet Take 12.5 mg by mouth 2 (two) times daily.   Yes [provider]  pantoprazole (PROTONIX) 40 MG tablet TAKE 1 TABLET BY MOUTH EVERYDAY AT BEDTIME 02/05/23  Yes Mahsa Hanser, MD  psyllium (METAMUCIL SMOOTH TEXTURE) 28 % packet Take 1 packet by mouth daily.   Yes [provider]  spironolactone (ALDACTONE) 25 MG tablet Take 12.5 mg by mouth daily. 05/29/23 05/28/24 Yes [provider]  tamsulosin (FLOMAX) 0.4 MG CAPS capsule TAKE 1 CAPSULE BY MOUTH EVERY DAY 05/11/22  Yes Stoioff, Verna Czech, MD  venlafaxine XR (EFFEXOR-XR) 150 MG 24 hr capsule Take 150 mg by mouth daily. 06/29/21  Yes [provider]  albuterol (VENTOLIN HFA) 108 (90 Base) MCG/ACT  inhaler SMARTSIG:2 inhalation Via Inhaler Every 4 Hours PRN 11/08/21   [provider]  ASPIRIN LOW DOSE 81 MG tablet TAKE 1 TABLET (81 MG TOTAL) BY MOUTH DAILY AT 6 (SIX) AM. SWALLOW WHOLE. 09/13/23   Georgiana Spinner, NP  cholecalciferol (VITAMIN D3) 25 MCG (1000 UT) tablet Take 1,000 Units by mouth every evening.    [provider]  clopidogrel (PLAVIX) 75 MG tablet Take 75 mg by mouth daily.  12/21/15   [provider]  Coenzyme Q10 (COQ10) 100 MG CAPS Take 100 mg by mouth at bedtime.    [provider]  fluticasone (FLONASE) 50 MCG/ACT nasal spray Place 2 sprays into both nostrils daily.    [provider]  fluticasone-salmeterol (WIXELA INHUB) 500-50 MCG/ACT AEPB Inhale 2 puffs into the lungs in the morning and at bedtime.     [provider]  glucose blood test strip     [provider]  Multiple Vitamin (MULTIVITAMIN WITH MINERALS) TABS Take 1 tablet by mouth daily.    [provider]  RA KRILL OIL 500 MG CAPS Take 500 mg by mouth at bedtime.     [provider]  triamcinolone cream (KENALOG) 0.1 % Apply 1 Application topically 2 (two) times daily.    [provider]    Allergies as of 10/09/2023   (No Known Allergies)    Family History  Problem Relation Age of Onset   Heart failure Father    Throat cancer Mother    Diabetes Brother     Social History   Socioeconomic History   Marital status: Married    Spouse name: Bonita Quin   Number of children: 1   Years of education: HS   Highest education level: Not on file  Occupational History   Occupation: Retired    Comment: Part Time [employer]  Tobacco Use   Smoking status: Former    Current packs/day: 0.00    Types: Cigarettes    Quit date: 05/20/2012    Years since quitting: 11.4   Smokeless tobacco: Never  Vaping Use   Vaping status: Never Used  Substance and Sexual Activity   Alcohol use: Not Currently    Comment: No alcohol since stroke   Drug use: Never   Sexual activity: Yes    Birth control/protection: None  Other Topics Concern   Not on file  Social History Narrative   Patient lives at home with his wife works at  [employer] has a 12 grade education with 1 child.   Patient quit smoking 05-11-2012 patient drinks alcoholic drinks he also drinks cafinted drinks daily.   Social Drivers of Corporate investment banker Strain: Low Risk  (03/20/2023)   Received from [facility]   Overall Financial Resource Strain (CARDIA)    Difficulty of Paying Living Expenses: Not very hard  Food Insecurity: No Food Insecurity (05/13/2023)   Hunger Vital Sign    Worried About Running Out of Food in the Last Year: Never true    Ran Out of Food in the Last Year: Never true  Transportation  Needs: No Transportation Needs (05/13/2023)   PRAPARE - Administrator, Civil Service (Medical): No    Lack of Transportation (Non-Medical): No  Physical Activity: Not on file  Stress: Not on file  Social Connections: Not on file  Intimate Partner Violence: Not At Risk (05/13/2023)   Humiliation, Afraid, Rape, and Kick questionnaire    Fear of Current or Ex-Partner:  No    Emotionally Abused: No    Physically Abused: No    Sexually Abused: No    Review of Systems: See HPI, otherwise negative ROS  Physical Exam: BP (!) 149/74   Pulse (!) 49   Temp (!) 97 F (36.1 C) (Temporal)   Resp 18   Ht 5\' 11"  (1.803 m)   Wt 95.3 kg   SpO2 97%   BMI 29.29 kg/m  General:   Alert,  pleasant and cooperative in NAD Head:  Normocephalic and atraumatic. Neck:  Supple; no masses or thyromegaly. Lungs:  Clear throughout to auscultation.    Heart:  Regular rate and rhythm. Abdomen:  Soft, nontender and nondistended. Normal bowel sounds, without guarding, and without rebound.   Neurologic:  Alert and  oriented x4;  grossly normal neurologically.  Impression/Plan: KEONDRE MARKSON is here for an endoscopy to be performed for dysphagia  Risks, benefits, limitations, and alternatives regarding  endoscopy have been reviewed with the patient.  Questions have been answered.  All parties agreeable.   Midge Minium, MD  10/17/2023, 7:53 AM

## 2023-10-17 NOTE — Anesthesia Postprocedure Evaluation (Signed)
 Anesthesia Post Note  Patient: LAVAN IMES  Procedure(s) Performed: EGD (ESOPHAGOGASTRODUODENOSCOPY) DILATION, ESOPHAGUS  Patient location during evaluation: Endoscopy Anesthesia Type: General Level of consciousness: awake and alert Pain management: pain level controlled Vital Signs Assessment: post-procedure vital signs reviewed and stable Respiratory status: spontaneous breathing, nonlabored ventilation, respiratory function stable and patient connected to nasal cannula oxygen Cardiovascular status: blood pressure returned to baseline and stable Postop Assessment: no apparent nausea or vomiting Anesthetic complications: no   No notable events documented.   Last Vitals:  Vitals:   10/17/23 0833 10/17/23 0843  BP: 109/64 (!) 140/79  Pulse: 61 60  Resp: 16 17  Temp:    SpO2: 96% 96%    Last Pain:  Vitals:   10/17/23 0843  TempSrc:   PainSc: 0-No pain                 Cleda Mccreedy Gilmar Bua

## 2023-10-17 NOTE — Anesthesia Preprocedure Evaluation (Signed)
 Anesthesia Evaluation  Patient identified by MRN, date of birth, ID band Patient confused    Reviewed: Allergy & Precautions, NPO status , Patient's Chart, lab work & pertinent test results  History of Anesthesia Complications Negative for: history of anesthetic complications  Airway Mallampati: III  TM Distance: <3 FB Neck ROM: full    Dental  (+) Upper Dentures, Lower Dentures   Pulmonary shortness of breath and with exertion, sleep apnea , COPD, former smoker   Pulmonary exam normal        Cardiovascular hypertension, (-) angina + CAD, + Peripheral Vascular Disease and +CHF  Normal cardiovascular exam     Neuro/Psych CVA, Residual Symptoms    GI/Hepatic Neg liver ROS,GERD  Controlled,,  Endo/Other  diabetes, Type 2Hypothyroidism    Renal/GU negative Renal ROS  negative genitourinary   Musculoskeletal   Abdominal   Peds  Hematology negative hematology ROS (+)   Anesthesia Other Findings Past Medical History: No date: Accelerated junctional rhythm No date: Anemia No date: Anxiety     Comment:  a.) Tx'd with BZO PRN No date: Arthritis No date: Bilateral carotid artery disease ([name]) No date: BPH (benign prostatic hypertrophy) 10/01/2023: CAD (coronary artery disease) No date: CAD S/P percutaneous coronary angioplasty     Comment:  a.) PCI in 2002 placing a RCA stent (unknown type). b.)               LHC 12/07/2015: 5% ISR m-dRCA, 90% dRCA, 25% pRCA, 25%               p-mLCx, 100% OM3, 95% oOM2-OM2, 75% m-dLAD; refer to               CVTS. c.) 3v CABG 01/03/2016 No date: CHF (congestive heart failure) ([name])     Comment:  a.) TTE 05/12/2012: EF 50-55%; mild LA enlargement;               G1DD. b.)  TTE 11/26/2015: EF 45%; mild BAE; triv PR,               mild MR/TR; inferolateral HK; G1DD. c.)  TTE 11/07/2017:               EF 35%; RV enlargement; BAE; inferior and inferolateral               HK; triv PR; mild  MR/TR. d.)  TTE 09/17/2019: EF 35%;               mild LVH; triv MR/TR. e.)  TTE 08/11/2021: EF 50%; triv               MR/TR; G1DD. 10/01/2023: Chronic obstructive pulmonary disease ([name]) No date: Cognitive deficit following cerebrovascular accident (CVA) No date: COPD (chronic obstructive pulmonary disease) ([name]) No date: Cortical cataract No date: Depression 10/01/2023: Diabetes mellitus ([name]) No date: Dyspnea No date: GERD (gastroesophageal reflux disease) 01/19/2021: History of 2019 novel coronavirus disease (COVID-19) 2002: History of acute inferior wall MI     Comment:  a.) PCI was performed placing a RCA stent (unknown type) No date: History of kidney stones No date: HTN (hypertension) No date: Hyperlipidemia No date: Hypothyroidism No date: Long term current use of antithrombotics/antiplatelets     Comment:  a.) DAPT therapy (ASA + clopidogrel) No date: OSA (obstructive sleep apnea)     Comment:  a.) does not require nocturnal PAP therapy No date: PLMD (periodic limb movement disorder) 07/07/2022: 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 b/) residual RIGHT sided               weakness No date: T2DM (type 2 diabetes mellitus) ([name]) No date: Tobacco abuse  Past Surgical History: 12/07/2015: CARDIAC CATHETERIZATION; N/A     Comment:  Procedure: Left Heart Cath and Coronary Angiography;                Surgeon: Lamar Blinks, MD;  Location: [facility] INVASIVE                CV LAB;  Service: Cardiovascular;  Laterality: N/A; 06/05/2022: CAROTID ANGIOGRAPHY; N/A     Comment:  Procedure: CAROTID ANGIOGRAPHY;  Surgeon: Annice Needy,               MD;  Location: [facility] CV [facility];  Service:               Cardiovascular;  Laterality: N/A; No date: CAROTID PTA/STENT INTERVENTION; Left     Comment:  HX: 2 stents 03/24/2021: CATARACT EXTRACTION W/PHACO; Left     Comment:  Procedure: CATARACT EXTRACTION PHACO AND INTRAOCULAR               LENS PLACEMENT (IOC) LEFT DIABETIC 6.67 00:42.5;                Surgeon: Galen Manila, MD;  Location: [facility] ORS;                Service: Ophthalmology;  Laterality: Left; 02/18/2019: COLONOSCOPY WITH PROPOFOL; N/A     Comment:  Procedure: COLONOSCOPY WITH PROPOFOL;  Surgeon: Midge Minium, MD;  Location: [facility] ENDOSCOPY;  Service:               Endoscopy;  Laterality: N/A; 12/22/2021: COLONOSCOPY WITH PROPOFOL; N/A     Comment:  Procedure: COLONOSCOPY WITH PROPOFOL;  Surgeon: Midge Minium, MD;  Location: [facility] ENDOSCOPY;  Service:               Endoscopy;  Laterality: N/A; 2002: CORONARY ANGIOPLASTY WITH STENT PLACEMENT; Left 01/03/2016: CORONARY ARTERY BYPASS GRAFT; N/A     Comment:  Procedure: 3v CORONARY ARTERY BYPASS GRAFT (LIMA-LAD,               SVG-PDA, SVG-OM2); Location: [facility]; Surgeon: Cain Sieve, MD 10/04/2021: Bluford Kaufmann W/ URETERAL STENT REMOVAL; N/A     Comment:  Procedure: CYSTOSCOPY WITH PROSTATE FOREIGN BODY               REMOVAL;  Surgeon: Riki Altes, MD;  Location: [facility]  ORS;  Service: Urology;  Laterality: N/A; 04/29/2019: CYSTOSCOPY WITH INSERTION OF UROLIFT; N/A     Comment:  Procedure: CYSTOSCOPY WITH INSERTION OF UROLIFT;                Surgeon: Riki Altes, MD;  Location: [facility] ORS;                Service: Urology;  Laterality: N/A; 09/11/2016: ESOPHAGOGASTRODUODENOSCOPY (EGD) WITH PROPOFOL; N/A     Comment:  Procedure:  ESOPHAGOGASTRODUODENOSCOPY (EGD) WITH               PROPOFOL;  Surgeon: Midge Minium, MD;  Location: [facility]               ENDOSCOPY;  Service: Endoscopy;  Laterality: N/A; 10/17/2016: ESOPHAGOGASTRODUODENOSCOPY (EGD) WITH PROPOFOL; N/A     Comment:  Procedure: ESOPHAGOGASTRODUODENOSCOPY (EGD) WITH               PROPOFOL;  Surgeon: Midge Minium, MD;  Location: [facility]               ENDOSCOPY;  Service: Endoscopy;  Laterality: N/A; 11/13/2017: ESOPHAGOGASTRODUODENOSCOPY (EGD) WITH PROPOFOL; N/A     Comment:  Procedure: ESOPHAGOGASTRODUODENOSCOPY (EGD) WITH               PROPOFOL;  Surgeon: Midge Minium, MD;  Location: [facility]               ENDOSCOPY;  Service: Endoscopy;  Laterality: N/A; No date: pins R lower leg; Left No date: rotator cuff replaced; Right 10/17/2016: SAVORY DILATION; N/A     Comment:  Procedure: SAVORY DILATION;  Surgeon: Midge Minium, MD;                Location: [facility] ENDOSCOPY;  Service: Endoscopy;                Laterality: N/A; 10/04/2021: TRANSURETHRAL INCISION OF PROSTATE; N/A     Comment:  Procedure: TRANSURETHRAL INCISION OF THE PROSTATE               (TUIP);  Surgeon: Riki Altes, MD;  Location: [facility]               ORS;  Service: Urology;  Laterality: N/A;  BMI    Body Mass Index: 29.29 kg/m      Reproductive/Obstetrics negative OB ROS                             Anesthesia Physical Anesthesia Plan  ASA: 3  Anesthesia Plan: General   Post-op Pain Management:    Induction: Intravenous  PONV Risk Score and Plan: Propofol infusion and TIVA  Airway Management Planned: Natural Airway and Nasal Cannula  Additional Equipment:   Intra-op Plan:   Post-operative Plan:   Informed Consent: I have reviewed the patients History and Physical, chart, labs and discussed the procedure including the risks, benefits and alternatives for the proposed anesthesia with the patient or authorized representative who has indicated his/her  understanding and acceptance.     Dental Advisory Given  Plan Discussed with: Anesthesiologist, CRNA and Surgeon  Anesthesia Plan Comments: (Patient and wife consented for risks of anesthesia including but not limited to:  - adverse reactions to medications - risk of airway placement if required - damage to eyes, teeth, lips or other oral mucosa - nerve damage due to positioning  - sore throat or hoarseness - Damage to heart, brain, nerves, lungs, other parts of  body or loss of life  They voiced understanding and assent.)       Anesthesia Quick Evaluation

## 2023-10-19 ENCOUNTER — Telehealth: Payer: Self-pay

## 2023-10-19 ENCOUNTER — Other Ambulatory Visit: Payer: Self-pay

## 2023-10-19 DIAGNOSIS — K222 Esophageal obstruction: Secondary | ICD-10-CM

## 2023-10-19 NOTE — Telephone Encounter (Signed)
 Left message on voicemail to schedule repeat EGD 3 week

## 2023-10-22 NOTE — Telephone Encounter (Signed)
 Repeat EGD scheduled and PPW released to Mychart and mailed to pt

## 2023-10-29 DIAGNOSIS — E119 Type 2 diabetes mellitus without complications: Secondary | ICD-10-CM | POA: Diagnosis not present

## 2023-10-29 DIAGNOSIS — H43813 Vitreous degeneration, bilateral: Secondary | ICD-10-CM | POA: Diagnosis not present

## 2023-10-29 DIAGNOSIS — H52203 Unspecified astigmatism, bilateral: Secondary | ICD-10-CM | POA: Diagnosis not present

## 2023-10-29 DIAGNOSIS — H1711 Central corneal opacity, right eye: Secondary | ICD-10-CM | POA: Diagnosis not present

## 2023-10-30 DIAGNOSIS — I5022 Chronic systolic (congestive) heart failure: Secondary | ICD-10-CM | POA: Diagnosis not present

## 2023-10-30 DIAGNOSIS — R001 Bradycardia, unspecified: Secondary | ICD-10-CM | POA: Diagnosis not present

## 2023-11-07 ENCOUNTER — Encounter: Payer: Self-pay | Admitting: Gastroenterology

## 2023-11-08 ENCOUNTER — Ambulatory Visit
Admission: RE | Admit: 2023-11-08 | Discharge: 2023-11-08 | Disposition: A | Discharge status: Home / Self Care | Attending: Gastroenterology | Admitting: Gastroenterology

## 2023-11-08 ENCOUNTER — Ambulatory Visit: Admitting: Certified Registered"

## 2023-11-08 ENCOUNTER — Encounter: Payer: Self-pay | Admitting: Gastroenterology

## 2023-11-08 ENCOUNTER — Encounter
Admission: RE | Disposition: A | Discharge status: Home / Self Care | Payer: Self-pay | Source: Home / Self Care | Attending: Gastroenterology

## 2023-11-08 DIAGNOSIS — I110 Hypertensive heart disease with heart failure: Secondary | ICD-10-CM | POA: Diagnosis not present

## 2023-11-08 DIAGNOSIS — E039 Hypothyroidism, unspecified: Secondary | ICD-10-CM | POA: Insufficient documentation

## 2023-11-08 DIAGNOSIS — Z7984 Long term (current) use of oral hypoglycemic drugs: Secondary | ICD-10-CM | POA: Diagnosis not present

## 2023-11-08 DIAGNOSIS — F419 Anxiety disorder, unspecified: Secondary | ICD-10-CM | POA: Insufficient documentation

## 2023-11-08 DIAGNOSIS — E1151 Type 2 diabetes mellitus with diabetic peripheral angiopathy without gangrene: Secondary | ICD-10-CM | POA: Diagnosis not present

## 2023-11-08 DIAGNOSIS — K222 Esophageal obstruction: Secondary | ICD-10-CM | POA: Insufficient documentation

## 2023-11-08 DIAGNOSIS — J449 Chronic obstructive pulmonary disease, unspecified: Secondary | ICD-10-CM | POA: Insufficient documentation

## 2023-11-08 DIAGNOSIS — K449 Diaphragmatic hernia without obstruction or gangrene: Secondary | ICD-10-CM | POA: Diagnosis not present

## 2023-11-08 DIAGNOSIS — I509 Heart failure, unspecified: Secondary | ICD-10-CM | POA: Insufficient documentation

## 2023-11-08 DIAGNOSIS — R1310 Dysphagia, unspecified: Secondary | ICD-10-CM | POA: Diagnosis not present

## 2023-11-08 DIAGNOSIS — Z87891 Personal history of nicotine dependence: Secondary | ICD-10-CM | POA: Diagnosis not present

## 2023-11-08 DIAGNOSIS — Z8673 Personal history of transient ischemic attack (TIA), and cerebral infarction without residual deficits: Secondary | ICD-10-CM | POA: Diagnosis not present

## 2023-11-08 DIAGNOSIS — I2510 Atherosclerotic heart disease of native coronary artery without angina pectoris: Secondary | ICD-10-CM | POA: Diagnosis not present

## 2023-11-08 DIAGNOSIS — G4733 Obstructive sleep apnea (adult) (pediatric): Secondary | ICD-10-CM | POA: Insufficient documentation

## 2023-11-08 DIAGNOSIS — I5022 Chronic systolic (congestive) heart failure: Secondary | ICD-10-CM | POA: Diagnosis not present

## 2023-11-08 HISTORY — PX: ESOPHAGOGASTRODUODENOSCOPY: SHX5428

## 2023-11-08 LAB — GLUCOSE, CAPILLARY: Glucose-Capillary: 172 mg/dL — ABNORMAL HIGH (ref 70–99)

## 2023-11-08 SURGERY — EGD (ESOPHAGOGASTRODUODENOSCOPY)
Anesthesia: General

## 2023-11-08 MED ORDER — LIDOCAINE HCL (PF) 2 % IJ SOLN
INTRAMUSCULAR | Status: DC | PRN
  Administered 2023-11-08: 100 mg via INTRADERMAL

## 2023-11-08 MED ORDER — SODIUM CHLORIDE 0.9 % IV SOLN
INTRAVENOUS | Status: DC
Start: 2023-11-08 — End: 2023-11-08

## 2023-11-08 MED ORDER — PHENYLEPHRINE HCL-NACL 20-0.9 MG/250ML-% IV SOLN
INTRAVENOUS | Status: AC
  Filled 2023-11-08: qty 250

## 2023-11-08 MED ORDER — PROPOFOL 500 MG/50ML IV EMUL
INTRAVENOUS | Status: DC | PRN
Start: 2023-11-08 — End: 2023-11-08
  Administered 2023-11-08: 20 mg via INTRAVENOUS
  Administered 2023-11-08: 10 mg via INTRAVENOUS
  Administered 2023-11-08: 100 mg via INTRAVENOUS
  Administered 2023-11-08 (×2): 20 mg via INTRAVENOUS

## 2023-11-08 MED ORDER — PROPOFOL 10 MG/ML IV BOLUS
INTRAVENOUS | Status: AC
  Filled 2023-11-08: qty 40

## 2023-11-08 MED ORDER — LIDOCAINE HCL (PF) 2 % IJ SOLN
INTRAMUSCULAR | Status: AC
  Filled 2023-11-08: qty 5

## 2023-11-08 NOTE — Anesthesia Preprocedure Evaluation (Signed)
 Anesthesia Evaluation  Patient identified by MRN, date of birth, ID band Patient awake    Reviewed: Allergy & Precautions, NPO status , Patient's Chart, lab work & pertinent test results  Airway Mallampati: II  TM Distance: >3 FB Neck ROM: full    Dental  (+) Upper Dentures   Pulmonary neg pulmonary ROS, sleep apnea , COPD, former smoker   Pulmonary exam normal  + decreased breath sounds      Cardiovascular Exercise Tolerance: Poor hypertension, Pt. on medications + CAD, + Peripheral Vascular Disease and +CHF  negative cardio ROS Normal cardiovascular exam Rhythm:Regular Rate:Normal     Neuro/Psych   Anxiety     CVA negative neurological ROS  negative psych ROS   GI/Hepatic negative GI ROS, Neg liver ROS,GERD  Medicated,,  Endo/Other  negative endocrine ROSdiabetes, Type 2Hypothyroidism    Renal/GU negative Renal ROS  negative genitourinary   Musculoskeletal  (+) Arthritis ,    Abdominal   Peds negative pediatric ROS (+)  Hematology negative hematology ROS (+) Blood dyscrasia, anemia   Anesthesia Other Findings Past Medical History: No date: Accelerated junctional rhythm No date: Anemia No date: Anxiety     Comment:  a.) Tx'd with BZO PRN No date: Arthritis No date: Bilateral carotid artery disease ([name]) No date: BPH (benign prostatic hypertrophy) 10/01/2023: CAD (coronary artery disease) No date: CAD S/P percutaneous coronary angioplasty     Comment:  a.) PCI in 2002 placing a RCA stent (unknown type). b.)               LHC 12/07/2015: 5% ISR m-dRCA, 90% dRCA, 25% pRCA, 25%               p-mLCx, 100% OM3, 95% oOM2-OM2, 75% m-dLAD; refer to               CVTS. c.) 3v CABG 01/03/2016 No date: CHF (congestive heart failure) ([name])     Comment:  a.) TTE 05/12/2012: EF 50-55%; mild LA enlargement;               G1DD. b.)  TTE 11/26/2015: EF 45%; mild BAE; triv PR,               mild MR/TR; inferolateral HK;  G1DD. c.)  TTE 11/07/2017:               EF 35%; RV enlargement; BAE; inferior and inferolateral               HK; triv PR; mild MR/TR. d.)  TTE 09/17/2019: EF 35%;               mild LVH; triv MR/TR. e.)  TTE 08/11/2021: EF 50%; triv               MR/TR; G1DD. 10/01/2023: Chronic obstructive pulmonary disease ([name]) No date: Cognitive deficit following cerebrovascular accident (CVA) No date: COPD (chronic obstructive pulmonary disease) ([name]) No date: Cortical cataract No date: Depression 10/01/2023: Diabetes mellitus ([name]) No date: Dyspnea No date: GERD (gastroesophageal reflux disease) 01/19/2021: History of 2019 novel coronavirus disease (COVID-19) 2002: History of acute inferior wall MI     Comment:  a.) PCI was performed placing a RCA stent (unknown type) No date: History of kidney stones No date: HTN (hypertension) No date: Hyperlipidemia No date: Hypothyroidism No date: Long term current use of antithrombotics/antiplatelets     Comment:  a.) DAPT therapy (ASA + clopidogrel) No date: OSA (obstructive sleep apnea)     Comment:  a.) does not require nocturnal PAP therapy No date: PLMD (periodic limb movement disorder) 07/07/2022: 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 b/) residual RIGHT sided               weakness No date: T2DM (type 2 diabetes mellitus) ([name]) No date: Tobacco abuse  Past Surgical History: 12/07/2015: CARDIAC CATHETERIZATION; N/A     Comment:  Procedure:  Left Heart Cath and Coronary Angiography;                Surgeon: Lamar Blinks, MD;  Location: [facility] INVASIVE               CV LAB;  Service: Cardiovascular;  Laterality: N/A; 06/05/2022: CAROTID ANGIOGRAPHY; N/A     Comment:  Procedure: CAROTID ANGIOGRAPHY;  Surgeon: Annice Needy,               MD;  Location: [facility] INVASIVE CV LAB;  Service:               Cardiovascular;  Laterality: N/A; No date: CAROTID PTA/STENT INTERVENTION; Left     Comment:  HX: 2 stents 03/24/2021: CATARACT EXTRACTION W/PHACO; Left     Comment:  Procedure: CATARACT EXTRACTION PHACO AND INTRAOCULAR               LENS PLACEMENT (IOC) LEFT DIABETIC 6.67 00:42.5;                Surgeon: Galen Manila, MD;  Location: [facility] ORS;                Service: Ophthalmology;  Laterality: Left; 02/18/2019: COLONOSCOPY WITH PROPOFOL; N/A     Comment:  Procedure: COLONOSCOPY WITH PROPOFOL;  Surgeon: Midge Minium, MD;  Location: [facility] ENDOSCOPY;  Service:               Endoscopy;  Laterality: N/A; 12/22/2021: COLONOSCOPY WITH PROPOFOL; N/A     Comment:  Procedure: COLONOSCOPY WITH PROPOFOL;  Surgeon: Midge Minium, MD;  Location: [facility] ENDOSCOPY;  Service:               Endoscopy;  Laterality: N/A; 2002: CORONARY ANGIOPLASTY WITH STENT PLACEMENT; Left 01/03/2016: CORONARY ARTERY BYPASS GRAFT; N/A     Comment:  Procedure: 3v CORONARY ARTERY BYPASS GRAFT (LIMA-LAD,               SVG-PDA, SVG-OM2); Location: [facility]; Surgeon: Cain Sieve, MD 10/04/2021: Bluford Kaufmann W/ URETERAL STENT REMOVAL; N/A     Comment:  Procedure: CYSTOSCOPY WITH PROSTATE FOREIGN BODY  REMOVAL;  Surgeon: Riki Altes, MD;  Location: [facility]               ORS;  Service: Urology;  Laterality: N/A; 04/29/2019: CYSTOSCOPY WITH INSERTION OF UROLIFT; N/A     Comment:  Procedure: CYSTOSCOPY WITH INSERTION OF UROLIFT;                Surgeon: Riki Altes, MD;  Location: [facility] ORS;                Service:  Urology;  Laterality: N/A; 10/17/2023: ESOPHAGEAL DILATION     Comment:  Procedure: DILATION, ESOPHAGUS;  Surgeon: Midge Minium,               MD;  Location: [facility] ENDOSCOPY;  Service: Endoscopy;; 10/17/2023: ESOPHAGOGASTRODUODENOSCOPY; N/A     Comment:  Procedure: EGD (ESOPHAGOGASTRODUODENOSCOPY);  Surgeon:               Midge Minium, MD;  Location: [facility] ENDOSCOPY;  Service:               Endoscopy;  Laterality: N/A; 09/11/2016: ESOPHAGOGASTRODUODENOSCOPY (EGD) WITH PROPOFOL; N/A     Comment:  Procedure: ESOPHAGOGASTRODUODENOSCOPY (EGD) WITH               PROPOFOL;  Surgeon: Midge Minium, MD;  Location: [facility]               ENDOSCOPY;  Service: Endoscopy;  Laterality: N/A; 10/17/2016: ESOPHAGOGASTRODUODENOSCOPY (EGD) WITH PROPOFOL; N/A     Comment:  Procedure: ESOPHAGOGASTRODUODENOSCOPY (EGD) WITH               PROPOFOL;  Surgeon: Midge Minium, MD;  Location: [facility]               ENDOSCOPY;  Service: Endoscopy;  Laterality: N/A; 11/13/2017: ESOPHAGOGASTRODUODENOSCOPY (EGD) WITH PROPOFOL; N/A     Comment:  Procedure: ESOPHAGOGASTRODUODENOSCOPY (EGD) WITH               PROPOFOL;  Surgeon: Midge Minium, MD;  Location: [facility]               ENDOSCOPY;  Service: Endoscopy;  Laterality: N/A; No date: pins R lower leg; Left No date: rotator cuff replaced; Right 10/17/2016: SAVORY DILATION; N/A     Comment:  Procedure: SAVORY DILATION;  Surgeon: Midge Minium, MD;                Location: [facility] ENDOSCOPY;  Service: Endoscopy;                Laterality: N/A; No date: SHOULDER ARTHROSCOPY No date: TONSILLECTOMY 10/04/2021: TRANSURETHRAL INCISION OF PROSTATE; N/A     Comment:  Procedure: TRANSURETHRAL INCISION OF THE PROSTATE               (TUIP);  Surgeon: Riki Altes, MD;  Location: [facility]               ORS;  Service: Urology;  Laterality: N/A;  BMI    Body Mass Index: 28.90 kg/m      Reproductive/Obstetrics negative OB ROS                             Anesthesia  Physical Anesthesia Plan  ASA: 3  Anesthesia Plan: General   Post-op Pain Management:    Induction: Intravenous  PONV Risk Score and Plan: Propofol infusion and TIVA  Airway Management Planned: Natural Airway and Nasal Cannula  Additional Equipment:  Intra-op Plan:   Post-operative Plan:   Informed Consent: I have reviewed the patients History and Physical, chart, labs and discussed the procedure including the risks, benefits and alternatives for the proposed anesthesia with the patient or authorized representative who has indicated his/her understanding and acceptance.     Dental Advisory Given  Plan Discussed with: CRNA  Anesthesia Plan Comments:        Anesthesia Quick Evaluation

## 2023-11-08 NOTE — Anesthesia Postprocedure Evaluation (Signed)
 Anesthesia Post Note  Patient: Paul Bass  Procedure(s) Performed: EGD (ESOPHAGOGASTRODUODENOSCOPY) Balloon dilation wire-guided  Patient location during evaluation: PACU Anesthesia Type: General Level of consciousness: awake Pain management: pain level controlled Vital Signs Assessment: post-procedure vital signs reviewed and stable Respiratory status: patient connected to nasal cannula oxygen Cardiovascular status: stable Anesthetic complications: no   No notable events documented.   Last Vitals:  Vitals:   11/08/23 1209 11/08/23 1217  BP: (!) 116/58 (!) 160/78  Pulse:    Resp:    Temp:    SpO2:      Last Pain:  Vitals:   11/08/23 1217  TempSrc:   PainSc: 0-No pain                 VAN STAVEREN,Rielly Brunn

## 2023-11-08 NOTE — Transfer of Care (Signed)
 Immediate Anesthesia Transfer of Care Note  Patient: Paul Bass  Procedure(s) Performed: EGD (ESOPHAGOGASTRODUODENOSCOPY) Balloon dilation wire-guided  Patient Location: Endoscopy Unit  Anesthesia Type:General  Level of Consciousness: awake and alert   Airway & Oxygen Therapy: Patient Spontanous Breathing  Post-op Assessment: Report given to RN and Post -op Vital signs reviewed and stable  Post vital signs: Reviewed and stable  Last Vitals:  Vitals Value Taken Time  BP 107/56 11/08/23 1151  Temp 35.8 1152  Pulse 46 11/08/23 1151  Resp 17 11/08/23 1151  SpO2 99 % 11/08/23 1151  Vitals shown include unfiled device data.  Last Pain:  Vitals:   11/08/23 1046  TempSrc: Temporal         Complications: No notable events documented.

## 2023-11-08 NOTE — H&P (Signed)
 Midge Minium, MD [facility] [address], [address] [place], [place] [address] [phone] Fax : [phone]  Primary Care Physician:  Myrene Buddy, NP Primary Gastroenterologist:  Dr. Servando Snare  Pre-Procedure History & Physical: HPI:  Paul Bass is a 78 y.o. male is here for an endoscopy.   Past Medical History:  Diagnosis Date   Accelerated junctional rhythm    Anemia    Anxiety    a.) Tx'd with BZO PRN   Arthritis    Bilateral carotid artery disease ([name])    BPH (benign prostatic hypertrophy)    CAD (coronary artery disease) 10/01/2023   CAD S/P percutaneous coronary angioplasty    a.) PCI in 2002 placing a RCA stent (unknown type). b.) LHC 12/07/2015: 5% ISR m-dRCA, 90% dRCA, 25% pRCA, 25% p-mLCx, 100% OM3, 95% oOM2-OM2, 75% m-dLAD; refer to CVTS. c.) 3v CABG 01/03/2016   CHF (congestive heart failure) ([name])    a.) TTE 05/12/2012: EF 50-55%; mild LA enlargement; G1DD. b.)  TTE 11/26/2015: EF 45%; mild BAE; triv PR, mild MR/TR; inferolateral HK; G1DD. c.)  TTE 11/07/2017: EF 35%; RV enlargement; BAE; inferior and inferolateral HK; triv PR; mild MR/TR. d.)  TTE 09/17/2019: EF 35%; mild LVH; triv MR/TR. e.)  TTE 08/11/2021: EF 50%; triv MR/TR; G1DD.   Chronic obstructive pulmonary disease ([name]) 10/01/2023   Cognitive deficit following cerebrovascular accident (CVA)    COPD (chronic obstructive pulmonary disease) ([name])    Cortical cataract    Depression    Diabetes mellitus ([name]) 10/01/2023   Dyspnea    GERD (gastroesophageal reflux disease)    History of 2019 novel coronavirus disease (COVID-19) 01/19/2021   History of acute inferior wall MI 2002   a.) PCI was performed placing a RCA stent (unknown type)   History of kidney stones    HTN (hypertension)    Hyperlipidemia    Hypothyroidism    Long term current use of antithrombotics/antiplatelets    a.) DAPT therapy (ASA + clopidogrel)   OSA (obstructive sleep apnea)    a.) does not require nocturnal PAP therapy    PLMD (periodic limb movement disorder)    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 b/) residual RIGHT sided weakness   T2DM (type 2 diabetes mellitus) ([name])    Tobacco abuse     Past Surgical History:  Procedure Laterality Date   CARDIAC CATHETERIZATION N/A 12/07/2015   Procedure: Left Heart Cath and Coronary Angiography;  Surgeon: Lamar Blinks, MD;  Location: [facility] INVAS[facility] CV LAB;  Service: Cardiovascular;  Laterality: N/A;   CAROTID ANGIOGRAPHY N/A 06/05/2022   Procedure: CAROTID ANGIOGRAPHY;  Surgeon: Annice Needy, MD;  Location: [facility] CV LAB;  Service: Cardiovascular;  Laterality: N/A;   CAROTID PTA/STENT INTERVENTION Left    HX: 2 stents   CATARACT EXTRACTION W/PHACO Left 03/24/2021   Procedure: CATARACT EXTRACTION PHACO AND INTRAOCULAR LENS PLACEMENT (IOC) LEFT DIABETIC 6.67 00:42.5;  Surgeon: Galen Manila, MD;  Location: [facility] ORS;  Service: Ophthalmology;  Laterality: Left;   COLONOSCOPY WITH PROPOFOL N/A 02/18/2019   Procedure: COLONOSCOPY WITH  PROPOFOL;  Surgeon: Midge Minium, MD;  Location: [facility] ENDOSCOPY;  Service: Endoscopy;  Laterality: N/A;   COLONOSCOPY WITH PROPOFOL N/A 12/22/2021   Procedure: COLONOSCOPY WITH PROPOFOL;  Surgeon: Midge Minium, MD;  Location: [facility] ENDOSCOPY;  Service: Endoscopy;  Laterality: N/A;   CORONARY ANGIOPLASTY WITH STENT PLACEMENT Left 2002   CORONARY ARTERY BYPASS GRAFT N/A 01/03/2016   Procedure: 3v CORONARY ARTERY BYPASS GRAFT (LIMA-LAD, SVG-PDA, SVG-OM2); Location: [facility]; Surgeon: Cain Sieve, MD    CYSTOSCOPY W/ URETERAL STENT REMOVAL N/A 10/04/2021   Procedure: CYSTOSCOPY WITH PROSTATE FOREIGN BODY REMOVAL;  Surgeon: Riki Altes, MD;  Location: [facility] ORS;  Service: Urology;  Laterality: N/A;   CYSTOSCOPY WITH INSERTION OF UROLIFT N/A 04/29/2019   Procedure: CYSTOSCOPY WITH INSERTION OF UROLIFT;  Surgeon: Riki Altes, MD;  Location: [facility] ORS;  Service: Urology;  Laterality: N/A;   ESOPHAGEAL DILATION  10/17/2023   Procedure: DILATION, ESOPHAGUS;  Surgeon: Midge Minium, MD;  Location: [facility] ENDOSCOPY;  Service: Endoscopy;;   ESOPHAGOGASTRODUODENOSCOPY N/A 10/17/2023   Procedure: EGD (ESOPHAGOGASTRODUODENOSCOPY);  Surgeon: Midge Minium, MD;  Location: [facility] ENDOSCOPY;  Service: Endoscopy;  Laterality: N/A;   ESOPHAGOGASTRODUODENOSCOPY (EGD) WITH PROPOFOL N/A 09/11/2016   Procedure: ESOPHAGOGASTRODUODENOSCOPY (EGD) WITH PROPOFOL;  Surgeon: Midge Minium, MD;  Location: [facility] ENDOSCOPY;  Service: Endoscopy;  Laterality: N/A;   ESOPHAGOGASTRODUODENOSCOPY (EGD) WITH PROPOFOL N/A 10/17/2016   Procedure: ESOPHAGOGASTRODUODENOSCOPY (EGD) WITH PROPOFOL;  Surgeon: Midge Minium, MD;  Location: [facility] ENDOSCOPY;  Service: Endoscopy;  Laterality: N/A;   ESOPHAGOGASTRODUODENOSCOPY (EGD) WITH PROPOFOL N/A 11/13/2017   Procedure: ESOPHAGOGASTRODUODENOSCOPY (EGD) WITH PROPOFOL;  Surgeon: Midge Minium, MD;  Location: [facility] ENDOSCOPY;  Service: Endoscopy;  Laterality: N/A;   pins R lower leg Left    rotator cuff replaced Right    SAVORY DILATION N/A 10/17/2016   Procedure: SAVORY DILATION;  Surgeon: Midge Minium, MD;  Location: [facility] ENDOSCOPY;  Service: Endoscopy;  Laterality: N/A;   SHOULDER ARTHROSCOPY     TONSILLECTOMY     TRANSURETHRAL INCISION OF PROSTATE N/A 10/04/2021   Procedure: TRANSURETHRAL INCISION OF THE PROSTATE (TUIP);  Surgeon: Riki Altes, MD;  Location: [facility] ORS;  Service: Urology;  Laterality: N/A;    Prior to Admission medications   Medication Sig Start Date End Date Taking?  Authorizing Provider  albuterol (VENTOLIN HFA) 108 (90 Base) MCG/ACT inhaler SMARTSIG:2 inhalation Via Inhaler Every 4 Hours PRN 11/08/21  Yes [provider]  ASPIRIN LOW DOSE 81 MG tablet TAKE 1 TABLET (81 MG TOTAL) BY MOUTH DAILY AT 6 (SIX) AM. SWALLOW WHOLE. 09/13/23  Yes Georgiana Spinner, NP  atorvastatin (LIPITOR) 80 MG tablet Take 80 mg by mouth daily.  01/09/16  Yes [provider]  cholecalciferol (VITAMIN D3) 25 MCG (1000 UT) tablet Take 1,000 Units by mouth every evening.   Yes [provider]  clonazePAM (KLONOPIN) 0.5 MG tablet Take 0.5 mg by mouth 2 (two) times daily.   Yes [provider]  Coenzyme Q10 (COQ10) 100 MG CAPS Take 100 mg by mouth at bedtime.   Yes [provider]  finasteride (PROSCAR) 5 MG tablet Take 5 mg by mouth daily.   Yes [provider]  gabapentin (NEURONTIN) 300 MG capsule Take 300 mg by mouth daily.   Yes [provider]  levothyroxine (SYNTHROID, LEVOTHROID) 100 MCG tablet Take 100 mcg by mouth daily before breakfast.   Yes [provider]  metFORMIN (GLUCOPHAGE) 500 MG tablet Take 500 mg by mouth 2 (two) times daily.  09/07/14  Yes [provider]  metoprolol tartrate (LOPRESSOR) 25 MG tablet Take 12.5 mg by mouth 2 (two) times daily.   Yes [provider]  Multiple Vitamin (MULTIVITAMIN WITH MINERALS) TABS Take 1 tablet by mouth daily.   Yes [provider]  pantoprazole (PROTONIX) 40 MG tablet TAKE 1 TABLET BY MOUTH EVERYDAY AT BEDTIME 02/05/23  Yes Lenix Kidd, MD  spironolactone (ALDACTONE) 25 MG tablet Take 12.5 mg by mouth daily. 05/29/23 05/28/24 Yes [provider]  tamsulosin (FLOMAX) 0.4 MG CAPS capsule TAKE 1 CAPSULE BY MOUTH EVERY DAY 05/11/22  Yes Stoioff, Verna Czech, MD  buPROPion (WELLBUTRIN XL) 150 MG 24 hr tablet Take 150 mg by mouth at bedtime. Patient not taking: Reported on 11/08/2023 04/07/20   [provider]  clopidogrel (PLAVIX) 75  MG tablet Take 75 mg by mouth daily.  12/21/15   [provider]  fluticasone (FLONASE) 50 MCG/ACT nasal spray Place 2 sprays into both nostrils daily.    [provider]  fluticasone-salmeterol (WIXELA INHUB) 500-50 MCG/ACT AEPB Inhale 2 puffs into the lungs in the morning and at bedtime. Patient not taking: Reported on 11/08/2023    [provider]  glucose blood test strip     [provider]  psyllium (METAMUCIL SMOOTH TEXTURE) 28 % packet Take 1 packet by mouth daily.    [provider]  RA KRILL OIL 500 MG CAPS Take 500 mg by mouth at bedtime.     [provider]  triamcinolone cream (KENALOG) 0.1 % Apply 1 Application topically 2 (two) times daily.    [provider]  venlafaxine XR (EFFEXOR-XR) 150 MG 24 hr capsule Take 150 mg by mouth daily. 06/29/21   [provider]    Allergies as of 10/19/2023   (No Known Allergies)    Family History  Problem Relation Age of Onset   Heart failure Father    Throat cancer Mother    Diabetes Brother     Social History   Socioeconomic History   Marital status: Married    Spouse name: Bonita Quin   Number of children: 1   Years of education: HS   Highest education level: Not on file  Occupational History   Occupation: Retired    Comment: Part Time [employer]  Tobacco Use   Smoking status: Former    Current packs/day: 0.00    Types: Cigarettes    Quit date: 05/20/2012    Years since quitting: 11.4   Smokeless tobacco: Never  Vaping Use   Vaping status: Never Used  Substance and Sexual Activity   Alcohol use: Not Currently    Comment: No alcohol since stroke   Drug use: Never   Sexual activity: Yes    Birth control/protection: None  Other Topics Concern   Not on file  Social History Narrative   Patient lives at home with his wife works at  [employer] has a 12 grade education with 1 child.   Patient quit smoking 05-11-2012 patient drinks alcoholic drinks he also  drinks cafinted drinks daily.   Social Drivers of Corporate investment banker Strain: Low Risk  (03/20/2023)   Received from [facility]   Overall Financial Resource Strain (CARDIA)    Difficulty of Paying Living Expenses: Not very hard  Food Insecurity: No Food Insecurity (05/13/2023)   Hunger Vital Sign    Worried About Running Out of Food in the Last Year: Never true    Ran Out of Food in the Last Year:  Never true  Transportation Needs: No Transportation Needs (05/13/2023)   PRAPARE - Administrator, Civil Service (Medical): No    Lack of Transportation (Non-Medical): No  Physical Activity: Not on file  Stress: Not on file  Social Connections: Not on file  Intimate Partner Violence: Not At Risk (05/13/2023)   Humiliation, Afraid, Rape, and Kick questionnaire    Fear of Current or Ex-Partner: No    Emotionally Abused: No    Physically Abused: No    Sexually Abused: No    Review of Systems: See HPI, otherwise negative ROS  Physical Exam: BP (!) 148/77   Pulse (!) 56   Temp (!) 95.7 F (35.4 C) (Temporal)   Wt 94 kg   SpO2 99%   BMI 28.90 kg/m  General:   Alert,  pleasant and cooperative in NAD Head:  Normocephalic and atraumatic. Neck:  Supple; no masses or thyromegaly. Lungs:  Clear throughout to auscultation.    Heart:  Regular rate and rhythm. Abdomen:  Soft, nontender and nondistended. Normal bowel sounds, without guarding, and without rebound.   Neurologic:  Alert and  oriented x4;  grossly normal neurologically.  Impression/Plan: Paul Bass is here for an endoscopy to be performed for dysphagia  Risks, benefits, limitations, and alternatives regarding  endoscopy have been reviewed with the patient.  Questions have been answered.  All parties agreeable.   Midge Minium, MD  11/08/2023, 11:04 AM

## 2023-11-08 NOTE — Op Note (Signed)
 [facility] Gastroenterology Patient Name: Paul Bass Procedure Date: 11/08/2023 11:32 AM MRN: [id number] Account #: [id number] Date of Birth: [birth year] Admit Type: Outpatient Age: 78 Room: [facility] ENDO ROOM 4 Gender: Male Note Status: Finalized Instrument Name: Upper Endoscope [id number] Procedure:             Upper GI endoscopy Indications:           Dysphagia Providers:             Midge Minium MD, MD Referring MD:          Caryl Asp (Referring MD) Medicines:             Propofol per Anesthesia Complications:         No immediate complications. Procedure:             Pre-Anesthesia Assessment:                        - Prior to the procedure, a History and Physical was                         performed, and patient medications and allergies were                         reviewed. The patient's tolerance of previous                         anesthesia was also reviewed. The risks and benefits                         of the procedure and the sedation options and risks                         were discussed with the patient. All questions were                         answered, and informed consent was obtained. Prior                         Anticoagulants: The patient has taken no anticoagulant                         or antiplatelet agents. ASA Grade Assessment: II - A                         patient with mild systemic disease. After reviewing                         the risks and benefits, the patient was deemed in                         satisfactory condition to undergo the procedure.                        After obtaining informed consent, the endoscope was                         passed under direct vision. Throughout the procedure,  the patient's blood pressure, pulse, and oxygen                         saturations were monitored continuously. The Endoscope                         was introduced through the mouth, and advanced to the                          second part of duodenum. The upper GI endoscopy was                         accomplished without difficulty. The patient tolerated                         the procedure well. Findings:      A medium-sized hiatal hernia was present.      One benign-appearing, intrinsic mild stenosis was found in the lower       third of the esophagus. The stenosis was traversed. A TTS dilator was       passed through the scope. Dilation with a 15-16.5-18 mm balloon dilator       was performed to 18 mm.      The stomach was normal.      The examined duodenum was normal. Impression:            - Medium-sized hiatal hernia.                        - Benign-appearing esophageal stenosis. Dilated.                        - Normal stomach.                        - Normal examined duodenum.                        - No specimens collected. Recommendation:        - Discharge patient to home.                        - Resume previous diet.                        - Continue present medications. Procedure Code(s):     --- Professional ---                        [phone], Esophagogastroduodenoscopy, flexible,                         transoral; with transendoscopic balloon dilation of                         esophagus (less than 30 mm diameter) Diagnosis Code(s):     --- Professional ---                        R13.10, Dysphagia, unspecified                        K22.2, Esophageal obstruction  CPT copyright 2022 [employer]. All rights reserved. The codes documented in this report are preliminary and upon coder review may  be revised to meet current compliance requirements. Midge Minium MD, MD 11/08/2023 11:48:08 AM This report has been signed electronically. Number of Addenda: 0 Note Initiated On: 11/08/2023 11:32 AM Estimated Blood Loss:  Estimated blood loss: none.      [facility]

## 2023-11-09 ENCOUNTER — Encounter: Payer: Self-pay | Admitting: Gastroenterology

## 2023-11-22 DIAGNOSIS — I2510 Atherosclerotic heart disease of native coronary artery without angina pectoris: Secondary | ICD-10-CM | POA: Diagnosis not present

## 2023-11-22 DIAGNOSIS — J449 Chronic obstructive pulmonary disease, unspecified: Secondary | ICD-10-CM | POA: Diagnosis not present

## 2023-11-22 DIAGNOSIS — I110 Hypertensive heart disease with heart failure: Secondary | ICD-10-CM | POA: Diagnosis not present

## 2023-11-22 DIAGNOSIS — E1151 Type 2 diabetes mellitus with diabetic peripheral angiopathy without gangrene: Secondary | ICD-10-CM | POA: Diagnosis not present

## 2023-11-22 DIAGNOSIS — I5022 Chronic systolic (congestive) heart failure: Secondary | ICD-10-CM | POA: Diagnosis not present

## 2023-11-22 DIAGNOSIS — N35919 Unspecified urethral stricture, male, unspecified site: Secondary | ICD-10-CM | POA: Diagnosis not present

## 2023-11-22 DIAGNOSIS — N35814 Other anterior urethral stricture, male: Secondary | ICD-10-CM | POA: Diagnosis not present

## 2023-11-22 DIAGNOSIS — N401 Enlarged prostate with lower urinary tract symptoms: Secondary | ICD-10-CM | POA: Diagnosis not present

## 2023-11-22 DIAGNOSIS — R338 Other retention of urine: Secondary | ICD-10-CM | POA: Diagnosis not present

## 2023-11-22 DIAGNOSIS — N411 Chronic prostatitis: Secondary | ICD-10-CM | POA: Diagnosis not present

## 2023-11-22 DIAGNOSIS — R339 Retention of urine, unspecified: Secondary | ICD-10-CM | POA: Diagnosis not present

## 2023-12-13 ENCOUNTER — Telehealth (INDEPENDENT_AMBULATORY_CARE_PROVIDER_SITE_OTHER): Payer: Self-pay

## 2023-12-13 DIAGNOSIS — I10 Essential (primary) hypertension: Secondary | ICD-10-CM | POA: Diagnosis not present

## 2023-12-13 DIAGNOSIS — J441 Chronic obstructive pulmonary disease with (acute) exacerbation: Secondary | ICD-10-CM | POA: Diagnosis not present

## 2023-12-13 DIAGNOSIS — I6523 Occlusion and stenosis of bilateral carotid arteries: Secondary | ICD-10-CM | POA: Diagnosis not present

## 2023-12-13 DIAGNOSIS — E782 Mixed hyperlipidemia: Secondary | ICD-10-CM | POA: Diagnosis not present

## 2023-12-13 DIAGNOSIS — R001 Bradycardia, unspecified: Secondary | ICD-10-CM | POA: Diagnosis not present

## 2023-12-13 DIAGNOSIS — I70209 Unspecified atherosclerosis of native arteries of extremities, unspecified extremity: Secondary | ICD-10-CM | POA: Diagnosis not present

## 2023-12-13 DIAGNOSIS — Z951 Presence of aortocoronary bypass graft: Secondary | ICD-10-CM | POA: Diagnosis not present

## 2023-12-13 DIAGNOSIS — I2510 Atherosclerotic heart disease of native coronary artery without angina pectoris: Secondary | ICD-10-CM | POA: Diagnosis not present

## 2023-12-13 DIAGNOSIS — I5022 Chronic systolic (congestive) heart failure: Secondary | ICD-10-CM | POA: Diagnosis not present

## 2023-12-13 NOTE — Telephone Encounter (Signed)
 Spoke with pts wife and let her know he can stop aspirin  as long as he stays on the Plavix .  She states understanding.

## 2023-12-13 NOTE — Telephone Encounter (Signed)
 If he is going to remain on plavix , that is fine

## 2023-12-18 ENCOUNTER — Encounter (INDEPENDENT_AMBULATORY_CARE_PROVIDER_SITE_OTHER): Payer: Self-pay

## 2023-12-25 DIAGNOSIS — I69311 Memory deficit following cerebral infarction: Secondary | ICD-10-CM | POA: Diagnosis not present

## 2023-12-25 DIAGNOSIS — J449 Chronic obstructive pulmonary disease, unspecified: Secondary | ICD-10-CM | POA: Diagnosis not present

## 2023-12-25 DIAGNOSIS — E785 Hyperlipidemia, unspecified: Secondary | ICD-10-CM | POA: Diagnosis not present

## 2023-12-25 DIAGNOSIS — Z79899 Other long term (current) drug therapy: Secondary | ICD-10-CM | POA: Diagnosis not present

## 2023-12-25 DIAGNOSIS — I110 Hypertensive heart disease with heart failure: Secondary | ICD-10-CM | POA: Diagnosis not present

## 2023-12-25 DIAGNOSIS — E039 Hypothyroidism, unspecified: Secondary | ICD-10-CM | POA: Diagnosis not present

## 2023-12-25 DIAGNOSIS — I5030 Unspecified diastolic (congestive) heart failure: Secondary | ICD-10-CM | POA: Diagnosis not present

## 2023-12-25 DIAGNOSIS — E1151 Type 2 diabetes mellitus with diabetic peripheral angiopathy without gangrene: Secondary | ICD-10-CM | POA: Diagnosis not present

## 2023-12-25 DIAGNOSIS — F418 Other specified anxiety disorders: Secondary | ICD-10-CM | POA: Diagnosis not present

## 2024-01-08 DIAGNOSIS — R4701 Aphasia: Secondary | ICD-10-CM | POA: Diagnosis not present

## 2024-01-08 DIAGNOSIS — F32A Depression, unspecified: Secondary | ICD-10-CM | POA: Diagnosis not present

## 2024-01-08 DIAGNOSIS — Z1331 Encounter for screening for depression: Secondary | ICD-10-CM | POA: Diagnosis not present

## 2024-01-08 DIAGNOSIS — G3184 Mild cognitive impairment, so stated: Secondary | ICD-10-CM | POA: Diagnosis not present

## 2024-01-08 DIAGNOSIS — Z8673 Personal history of transient ischemic attack (TIA), and cerebral infarction without residual deficits: Secondary | ICD-10-CM | POA: Diagnosis not present

## 2024-01-21 DIAGNOSIS — L2089 Other atopic dermatitis: Secondary | ICD-10-CM | POA: Diagnosis not present

## 2024-02-05 DIAGNOSIS — L989 Disorder of the skin and subcutaneous tissue, unspecified: Secondary | ICD-10-CM | POA: Diagnosis not present

## 2024-03-06 ENCOUNTER — Other Ambulatory Visit: Payer: Self-pay | Admitting: Gastroenterology

## 2024-04-01 DIAGNOSIS — Z1331 Encounter for screening for depression: Secondary | ICD-10-CM | POA: Diagnosis not present

## 2024-04-01 DIAGNOSIS — I2510 Atherosclerotic heart disease of native coronary artery without angina pectoris: Secondary | ICD-10-CM | POA: Diagnosis not present

## 2024-04-01 DIAGNOSIS — I70209 Unspecified atherosclerosis of native arteries of extremities, unspecified extremity: Secondary | ICD-10-CM | POA: Diagnosis not present

## 2024-04-01 DIAGNOSIS — E782 Mixed hyperlipidemia: Secondary | ICD-10-CM | POA: Diagnosis not present

## 2024-04-01 DIAGNOSIS — I10 Essential (primary) hypertension: Secondary | ICD-10-CM | POA: Diagnosis not present

## 2024-04-01 DIAGNOSIS — Z79899 Other long term (current) drug therapy: Secondary | ICD-10-CM | POA: Diagnosis not present

## 2024-04-01 DIAGNOSIS — Z0000 Encounter for general adult medical examination without abnormal findings: Secondary | ICD-10-CM | POA: Diagnosis not present

## 2024-04-01 DIAGNOSIS — E039 Hypothyroidism, unspecified: Secondary | ICD-10-CM | POA: Diagnosis not present

## 2024-04-01 DIAGNOSIS — E1159 Type 2 diabetes mellitus with other circulatory complications: Secondary | ICD-10-CM | POA: Diagnosis not present

## 2024-04-01 DIAGNOSIS — F418 Other specified anxiety disorders: Secondary | ICD-10-CM | POA: Diagnosis not present

## 2024-04-01 DIAGNOSIS — E119 Type 2 diabetes mellitus without complications: Secondary | ICD-10-CM | POA: Diagnosis not present

## 2024-04-01 DIAGNOSIS — I110 Hypertensive heart disease with heart failure: Secondary | ICD-10-CM | POA: Diagnosis not present

## 2024-04-01 DIAGNOSIS — I5030 Unspecified diastolic (congestive) heart failure: Secondary | ICD-10-CM | POA: Diagnosis not present

## 2024-04-29 DIAGNOSIS — I2510 Atherosclerotic heart disease of native coronary artery without angina pectoris: Secondary | ICD-10-CM | POA: Diagnosis not present

## 2024-04-29 DIAGNOSIS — I5022 Chronic systolic (congestive) heart failure: Secondary | ICD-10-CM | POA: Diagnosis not present

## 2024-05-27 ENCOUNTER — Other Ambulatory Visit: Payer: Self-pay | Admitting: Gastroenterology

## 2024-05-29 DIAGNOSIS — R339 Retention of urine, unspecified: Secondary | ICD-10-CM | POA: Diagnosis not present

## 2024-05-29 DIAGNOSIS — J449 Chronic obstructive pulmonary disease, unspecified: Secondary | ICD-10-CM | POA: Diagnosis not present

## 2024-05-29 DIAGNOSIS — Z951 Presence of aortocoronary bypass graft: Secondary | ICD-10-CM | POA: Diagnosis not present

## 2024-05-29 DIAGNOSIS — N35814 Other anterior urethral stricture, male: Secondary | ICD-10-CM | POA: Diagnosis not present

## 2024-05-29 DIAGNOSIS — I110 Hypertensive heart disease with heart failure: Secondary | ICD-10-CM | POA: Diagnosis not present

## 2024-05-29 DIAGNOSIS — I5022 Chronic systolic (congestive) heart failure: Secondary | ICD-10-CM | POA: Diagnosis not present

## 2024-05-29 DIAGNOSIS — E1151 Type 2 diabetes mellitus with diabetic peripheral angiopathy without gangrene: Secondary | ICD-10-CM | POA: Diagnosis not present

## 2024-05-29 DIAGNOSIS — N35919 Unspecified urethral stricture, male, unspecified site: Secondary | ICD-10-CM | POA: Diagnosis not present

## 2024-05-29 DIAGNOSIS — E785 Hyperlipidemia, unspecified: Secondary | ICD-10-CM | POA: Diagnosis not present

## 2024-05-29 DIAGNOSIS — I2510 Atherosclerotic heart disease of native coronary artery without angina pectoris: Secondary | ICD-10-CM | POA: Diagnosis not present

## 2024-06-17 DIAGNOSIS — B351 Tinea unguium: Secondary | ICD-10-CM | POA: Diagnosis not present

## 2024-06-17 DIAGNOSIS — L600 Ingrowing nail: Secondary | ICD-10-CM | POA: Diagnosis not present

## 2024-06-17 DIAGNOSIS — L03032 Cellulitis of left toe: Secondary | ICD-10-CM | POA: Diagnosis not present

## 2024-06-17 DIAGNOSIS — E1140 Type 2 diabetes mellitus with diabetic neuropathy, unspecified: Secondary | ICD-10-CM | POA: Diagnosis not present

## 2024-06-19 ENCOUNTER — Other Ambulatory Visit (INDEPENDENT_AMBULATORY_CARE_PROVIDER_SITE_OTHER): Payer: Self-pay | Admitting: Nurse Practitioner

## 2024-06-19 DIAGNOSIS — I6523 Occlusion and stenosis of bilateral carotid arteries: Secondary | ICD-10-CM

## 2024-06-24 ENCOUNTER — Other Ambulatory Visit (INDEPENDENT_AMBULATORY_CARE_PROVIDER_SITE_OTHER): Payer: Medicare HMO

## 2024-06-24 ENCOUNTER — Ambulatory Visit (INDEPENDENT_AMBULATORY_CARE_PROVIDER_SITE_OTHER): Payer: Medicare HMO | Admitting: Vascular Surgery

## 2024-06-24 ENCOUNTER — Encounter (INDEPENDENT_AMBULATORY_CARE_PROVIDER_SITE_OTHER): Payer: Self-pay | Admitting: Vascular Surgery

## 2024-06-24 VITALS — BP 124/71 | HR 63 | Resp 16 | Ht 71.0 in | Wt 210.4 lb

## 2024-06-24 DIAGNOSIS — I10 Essential (primary) hypertension: Secondary | ICD-10-CM | POA: Diagnosis not present

## 2024-06-24 DIAGNOSIS — E119 Type 2 diabetes mellitus without complications: Secondary | ICD-10-CM | POA: Diagnosis not present

## 2024-06-24 DIAGNOSIS — I6523 Occlusion and stenosis of bilateral carotid arteries: Secondary | ICD-10-CM

## 2024-06-24 DIAGNOSIS — E785 Hyperlipidemia, unspecified: Secondary | ICD-10-CM | POA: Diagnosis not present

## 2024-06-24 NOTE — Progress Notes (Signed)
 MRN : [id number]  Paul Bass is a 78 y.o. ([birth year]) male who presents with chief complaint of  Chief Complaint  Patient presents with   Follow-up    1 year carotid  .  History of Present Illness: Patient returns in follow-up of his carotid disease.  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  At the time of his stent, he was having worsening neurologic symptoms.  Since that time, he has had no further neurologic symptoms of concern.  He is doing well today.  His carotid duplex today shows 1 to 39% right ICA stenosis and a patent left carotid stent without recurrent stenosis.  Current Outpatient Medications  Medication Sig Dispense Refill   albuterol  (VENTOLIN  HFA) 108 (90 Base) MCG/ACT inhaler SMARTSIG:2 inhalation Via Inhaler Every 4 Hours PRN     atorvastatin  (LIPITOR) 80 MG tablet Take 80 mg by mouth daily.      cholecalciferol  (VITAMIN D3) 25 MCG (1000 UT) tablet Take 1,000 Units by mouth every evening.     clonazePAM  (KLONOPIN ) 0.5 MG tablet Take 0.5 mg by mouth 2 (two) times daily.     clopidogrel  (PLAVIX ) 75 MG tablet Take 75 mg by mouth daily.   1   finasteride  (PROSCAR ) 5 MG tablet Take 5 mg by mouth daily.     fluticasone (FLONASE) 50 MCG/ACT nasal spray Place 2 sprays into both nostrils daily.     gabapentin  (NEURONTIN ) 300 MG capsule Take 300 mg by mouth daily.     glucose blood test strip      levothyroxine  (SYNTHROID , LEVOTHROID) 100 MCG tablet Take 100 mcg by mouth daily before breakfast.     metFORMIN (GLUCOPHAGE) 500 MG tablet Take 500 mg by mouth 2 (two) times daily.      metoprolol  tartrate (LOPRESSOR ) 25 MG tablet Take 12.5 mg by mouth 2 (two) times daily.     Multiple Vitamin (MULTIVITAMIN WITH MINERALS) TABS Take 1 tablet by mouth daily.     pantoprazole  (PROTONIX ) 40 MG tablet Take 1 tablet (40 mg total) by mouth at bedtime. MUST SCHEDULE OFFICE VISIT 90 tablet 0    psyllium (METAMUCIL SMOOTH TEXTURE) 28 % packet Take 1 packet by mouth daily.     RA KRILL OIL 500 MG CAPS Take 500 mg by mouth at bedtime.      tamsulosin  (FLOMAX ) 0.4 MG CAPS capsule TAKE 1 CAPSULE BY MOUTH EVERY DAY 90 capsule 1   venlafaxine  XR (EFFEXOR -XR) 150 MG 24 hr capsule Take 150 mg by mouth daily.     ASPIRIN  LOW DOSE 81 MG tablet TAKE 1 TABLET (81 MG TOTAL) BY MOUTH DAILY AT 6 (SIX) AM. SWALLOW WHOLE. (Patient not taking: Reported on 06/24/2024) 90 tablet 1   buPROPion  (WELLBUTRIN  XL) 150 MG 24 hr tablet Take 150 mg by mouth at bedtime. (Patient not taking: Reported on 11/08/2023)     Coenzyme Q10 (COQ10) 100 MG CAPS Take 100 mg by mouth at bedtime. (Patient not taking: Reported on 06/24/2024)     fluticasone-salmeterol (WIXELA INHUB) 500-50 MCG/ACT AEPB Inhale 2 puffs into the lungs in the morning and at bedtime. (Patient not taking: Reported on 06/24/2024)     triamcinolone cream (KENALOG) 0.1 % Apply 1 Application topically 2 (two) times daily. (Patient not taking: Reported on 06/24/2024)     No current facility-administered medications for this visit.    Past Medical History:  Diagnosis Date   Accelerated  junctional rhythm    Anemia    Anxiety    a.) Tx'd with BZO PRN   Arthritis    Bilateral carotid artery disease    BPH (benign prostatic hypertrophy)    CAD (coronary artery disease) 10/01/2023   CAD S/P percutaneous coronary angioplasty    a.) PCI in 2002 placing a RCA stent (unknown type). b.) LHC 12/07/2015: 5% ISR m-dRCA, 90% dRCA, 25% pRCA, 25% p-mLCx, 100% OM3, 95% oOM2-OM2, 75% m-dLAD; refer to CVTS. c.) 3v CABG 01/03/2016   CHF (congestive heart failure) ([name])    a.) TTE 05/12/2012: EF 50-55%; mild LA enlargement; G1DD. b.)  TTE 11/26/2015: EF 45%; mild BAE; triv PR, mild MR/TR; inferolateral HK; G1DD. c.)  TTE 11/07/2017: EF 35%; RV enlargement; BAE; inferior and inferolateral HK; triv PR; mild MR/TR. d.)  TTE 09/17/2019: EF 35%; mild LVH; triv MR/TR. e.)  TTE  08/11/2021: EF 50%; triv MR/TR; G1DD.   Chronic obstructive pulmonary disease ([name]) 10/01/2023   Cognitive deficit following cerebrovascular accident (CVA)    COPD (chronic obstructive pulmonary disease) ([name])    Cortical cataract    Depression    Diabetes mellitus ([name]) 10/01/2023   Dyspnea    GERD (gastroesophageal reflux disease)    History of 2019 novel coronavirus disease (COVID-19) 01/19/2021   History of acute inferior wall MI 2002   a.) PCI was performed placing a RCA stent (unknown type)   History of kidney stones    HTN (hypertension)    Hyperlipidemia    Hypothyroidism    Long term current use of antithrombotics/antiplatelets    a.) DAPT therapy (ASA + clopidogrel )   OSA (obstructive sleep apnea)    a.) does not require nocturnal PAP therapy   PLMD (periodic limb movement disorder)    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 b/) residual RIGHT sided weakness   T2DM (type 2 diabetes mellitus) ([name])    Tobacco abuse     Past Surgical History:  Procedure Laterality Date   CARDIAC CATHETERIZATION N/A 12/07/2015   Procedure: Left Heart Cath and Coronary Angiography;  Surgeon: Wolm JINNY Rhyme, MD;  Location: [facility] INVASIVE CV LAB;  Service: Cardiovascular;  Laterality: N/A;   CAROTID ANGIOGRAPHY N/A 06/05/2022   Procedure: CAROTID ANGIOGRAPHY;  Surgeon: Marea Selinda RAMAN, MD;  Location: [facility] CV [facility];  Service: Cardiovascular;  Laterality: N/A;   CAROTID PTA/STENT INTERVENTION Left    HX: 2 stents   CATARACT EXTRACTION W/PHACO Left 03/24/2021    Procedure: CATARACT EXTRACTION PHACO AND INTRAOCULAR LENS PLACEMENT (IOC) LEFT DIABETIC 6.67 00:42.5;  Surgeon: Jaye Fallow, MD;  Location: [facility] ORS;  Service: Ophthalmology;  Laterality: Left;   COLONOSCOPY WITH PROPOFOL  N/A 02/18/2019   Procedure: COLONOSCOPY WITH PROPOFOL ;  Surgeon: Jinny Carmine, MD;  Location: [facility] ENDOSCOPY;  Service: Endoscopy;  Laterality: N/A;   COLONOSCOPY WITH PROPOFOL  N/A 12/22/2021   Procedure: COLONOSCOPY WITH PROPOFOL ;  Surgeon: Jinny Carmine, MD;  Location: [facility]COPY;  Service: Endoscopy;  Laterality: N/A;   CORONARY ANGIOPLASTY WITH STENT PLACEMENT Left 2002   CORONARY ARTERY BYPASS GRAFT N/A 01/03/2016  Procedure: 3v CORONARY ARTERY BYPASS GRAFT (LIMA-LAD, SVG-PDA, SVG-OM2); Location: [facility]; Surgeon: Debby Nova, MD   CYSTOSCOPY W/ URETERAL STENT REMOVAL N/A 10/04/2021   Procedure: CYSTOSCOPY WITH PROSTATE FOREIGN BODY REMOVAL;  Surgeon: Twylla Glendia BROCKS, MD;  Location: [facility] ORS;  Service: Urology;  Laterality: N/A;   CYSTOSCOPY WITH INSERTION OF UROLIFT N/A 04/29/2019   Procedure: CYSTOSCOPY WITH INSERTION OF UROLIFT;  Surgeon: Twylla Glendia BROCKS, MD;  Location: [facility] ORS;  Service: Urology;  Laterality: N/A;   ESOPHAGEAL DILATION  10/17/2023   Procedure: DILATION, ESOPHAGUS;  Surgeon: Jinny Carmine, MD;  Location: [facility] ENDOSCOPY;  Service: Endoscopy;;   ESOPHAGOGASTRODUODENOSCOPY N/A 10/17/2023   Procedure: EGD (ESOPHAGOGASTRODUODENOSCOPY);  Surgeon: Jinny Carmine, MD;  Location: [facility] ENDOSCOPY;  Service: Endoscopy;  Laterality: N/A;   ESOPHAGOGASTRODUODENOSCOPY N/A 11/08/2023   Procedure: EGD (ESOPHAGOGASTRODUODENOSCOPY);  Surgeon: Jinny Carmine, MD;  Location: [facility] ENDOSCOPY;  Service: Endoscopy;  Laterality: N/A;   ESOPHAGOGASTRODUODENOSCOPY (EGD) WITH PROPOFOL  N/A 09/11/2016   Procedure: ESOPHAGOGASTRODUODENOSCOPY (EGD) WITH PROPOFOL ;  Surgeon: Carmine Jinny, MD;  Location: [facility] ENDOSCOPY;  Service: Endoscopy;  Laterality: N/A;    ESOPHAGOGASTRODUODENOSCOPY (EGD) WITH PROPOFOL  N/A 10/17/2016   Procedure: ESOPHAGOGASTRODUODENOSCOPY (EGD) WITH PROPOFOL ;  Surgeon: Carmine Jinny, MD;  Location: [facility] ENDOSCOPY;  Service: Endoscopy;  Laterality: N/A;   ESOPHAGOGASTRODUODENOSCOPY (EGD) WITH PROPOFOL  N/A 11/13/2017   Procedure: ESOPHAGOGASTRODUODENOSCOPY (EGD) WITH PROPOFOL ;  Surgeon: Jinny Carmine, MD;  Location: [facility] ENDOSCOPY;  Service: Endoscopy;  Laterality: N/A;   pins R lower leg Left    rotator cuff replaced Right    SAVORY DILATION N/A 10/17/2016   Procedure: SAVORY DILATION;  Surgeon: Carmine Jinny, MD;  Location: [facility] ENDOSCOPY;  Service: Endoscopy;  Laterality: N/A;   SHOULDER ARTHROSCOPY     TONSILLECTOMY     TRANSURETHRAL INCISION OF PROSTATE N/A 10/04/2021   Procedure: TRANSURETHRAL INCISION OF THE PROSTATE (TUIP);  Surgeon: Twylla Glendia BROCKS, MD;  Location: [facility] ORS;  Service: Urology;  Laterality: N/A;     Social History   Tobacco Use   Smoking status: Former    Current packs/day: 0.00    Types: Cigarettes    Quit date: 05/20/2012    Years since quitting: 12.1   Smokeless tobacco: Never  Vaping Use   Vaping status: Never Used  Substance Use Topics   Alcohol use: Not Currently    Comment: No alcohol since stroke   Drug use: Never      Family History  Problem Relation Age of Onset   Heart failure Father    Throat cancer Mother    Diabetes Brother      No Known Allergies    REVIEW OF SYSTEMS (Negative unless checked)   Constitutional: [] Weight loss  [] Fever  [] Chills Cardiac: [] Chest pain   [] Chest pressure   [] Palpitations   [] Shortness of breath when laying flat   [] Shortness of breath at rest   [] Shortness of breath with exertion. Vascular:  [] Pain in legs with walking   [] Pain in legs at rest   [] Pain in legs when laying flat   [] Claudication   [] Pain in feet when walking  [] Pain in feet at rest  [] Pain in feet when laying flat   [] History of DVT   [] Phlebitis   [] Swelling in legs    [] Varicose veins   [] Non-healing ulcers Pulmonary:   [] Uses home oxygen   [] Productive cough   [] Hemoptysis   [] Wheeze  [x] COPD   [] Asthma Neurologic:  [] Dizziness  [] Blackouts   [] Seizures   [x] History of stroke   [] History of TIA  []   Aphasia   [] Temporary blindness   [] Dysphagia   [] Weakness or numbness in arms   [] Weakness or numbness in legs Musculoskeletal:  [x] Arthritis   [] Joint swelling   [] Joint pain   [] Low back pain Hematologic:  [x] Easy bruising  [] Easy bleeding   [] Hypercoagulable state   [x] Anemic   Gastrointestinal:  [] Blood in stool   [] Vomiting blood  [x] Gastroesophageal reflux/heartburn   [] Abdominal pain Genitourinary:  [] Chronic kidney disease   [] Difficult urination  [] Frequent urination  [] Burning with urination   [] Hematuria Skin:  [] Rashes   [] Ulcers   [] Wounds Psychological:  [x] History of anxiety   [x]  History of major depression.  Physical Examination  Vitals:   06/24/24 0903  BP: 124/71  Pulse: 63  Resp: 16  Weight: 210 lb 7.2 oz (95.5 kg)  Height: 5' 11 (1.803 m)   Body mass index is 29.35 kg/m. Gen:  WD/WN, NAD Head: [place]/AT, No temporalis wasting. Ear/Nose/Throat: Hearing grossly intact, nares w/o erythema or drainage, trachea midline Eyes: Conjunctiva clear. Sclera non-icteric Neck: Supple.  No bruit  Pulmonary:  Good air movement, equal and clear to auscultation bilaterally.  Cardiac: RRR, No JVD Vascular:  Vessel Right Left  Radial Palpable Palpable           Musculoskeletal: M/S 5/5 throughout.  No deformity or atrophy. No edema. Neurologic: CN 2-12 intact. Sensation grossly intact in extremities.  Symmetrical.  Speech is still a bit limited. Motor exam as listed above. Psychiatric: Judgment intact, Mood & affect appropriate for pt's clinical situation. Dermatologic: No rashes or ulcers noted.  No cellulitis or open wounds.     CBC Lab Results  Component Value Date   WBC 5.8 05/15/2023   HGB 12.5 (L) 05/15/2023   HCT 36.4 (L) 05/15/2023    MCV 92.4 05/15/2023   PLT 157 05/15/2023    BMET    Component Value Date/Time   NA 134 (L) 05/15/2023 0625   NA 141 05/11/2012 1929   K 3.7 05/15/2023 0625   K 3.9 05/11/2012 1929   CL 102 05/15/2023 0625   CL 107 05/11/2012 1929   CO2 23 05/15/2023 0625   CO2 26 05/11/2012 1929   GLUCOSE 169 (H) 05/15/2023 0625   GLUCOSE 144 (H) 05/11/2012 1929   BUN 13 05/15/2023 0625   BUN 11 05/11/2012 1929   CREATININE 0.80 05/15/2023 0625   CREATININE 0.85 05/11/2012 1929   CALCIUM  8.0 (L) 05/15/2023 0625   CALCIUM  8.5 05/11/2012 1929   GFRNONAA >60 05/15/2023 0625   GFRNONAA >60 05/11/2012 1929   GFRAA >60 07/01/2015 0857   GFRAA >60 05/11/2012 1929   CrCl cannot be calculated (Patient's most recent lab result is older than the maximum 21 days allowed.).  COAG Lab Results  Component Value Date   INR 1.2 05/12/2023   INR 1.06 07/01/2015   INR 1.03 09/20/2012    Radiology No results found.   Assessment/Plan Symptomatic carotid artery stenosis His carotid duplex today shows 1 to 39% right ICA stenosis and a patent left carotid stent without recurrent stenosis.  Doing well.  No change in his medical regimen which includes Plavix  and Lipitor.  Follow-up in 1 year with duplex.  Benign essential hypertension blood pressure control important in reducing the progression of atherosclerotic disease. On appropriate oral medications.     Type 2 diabetes mellitus without complications ([name]) blood glucose control important in reducing the progression of atherosclerotic disease. Also, involved in wound healing. On appropriate medications.     HLD (hyperlipidemia) lipid  control important in reducing the progression of atherosclerotic disease. Continue statin therapy  Selinda Gu, MD  06/24/2024 12:20 PM    This note was created with Dragon medical transcription system.  Any errors from dictation are purely unintentional

## 2024-06-24 NOTE — Assessment & Plan Note (Signed)
 His carotid duplex today shows 1 to 39% right ICA stenosis and a patent left carotid stent without recurrent stenosis.  Doing well.  No change in his medical regimen which includes Plavix  and Lipitor.  Follow-up in 1 year with duplex.

## 2024-08-17 ENCOUNTER — Other Ambulatory Visit: Payer: Self-pay | Admitting: Gastroenterology

## 2025-06-23 ENCOUNTER — Encounter (INDEPENDENT_AMBULATORY_CARE_PROVIDER_SITE_OTHER)

## 2025-06-23 ENCOUNTER — Ambulatory Visit (INDEPENDENT_AMBULATORY_CARE_PROVIDER_SITE_OTHER): Admitting: Vascular Surgery
